# Patient Record
Sex: Female | Born: 1955
Health system: Southern US, Community
[De-identification: ages and names within clinical notes are randomized; demographics above are authoritative.]

## PROBLEM LIST (undated history)

## (undated) DIAGNOSIS — I341 Nonrheumatic mitral (valve) prolapse: Secondary | ICD-10-CM

## (undated) DIAGNOSIS — M359 Systemic involvement of connective tissue, unspecified: Secondary | ICD-10-CM

## (undated) DIAGNOSIS — N8000 Endometriosis of the uterus, unspecified: Secondary | ICD-10-CM

## (undated) DIAGNOSIS — R591 Generalized enlarged lymph nodes: Secondary | ICD-10-CM

## (undated) DIAGNOSIS — K589 Irritable bowel syndrome without diarrhea: Secondary | ICD-10-CM

## (undated) DIAGNOSIS — I10 Essential (primary) hypertension: Secondary | ICD-10-CM

## (undated) DIAGNOSIS — M75100 Unspecified rotator cuff tear or rupture of unspecified shoulder, not specified as traumatic: Secondary | ICD-10-CM

## (undated) DIAGNOSIS — H269 Unspecified cataract: Secondary | ICD-10-CM

## (undated) DIAGNOSIS — F329 Major depressive disorder, single episode, unspecified: Secondary | ICD-10-CM

## (undated) DIAGNOSIS — F32A Depression, unspecified: Secondary | ICD-10-CM

## (undated) DIAGNOSIS — M1990 Unspecified osteoarthritis, unspecified site: Secondary | ICD-10-CM

## (undated) DIAGNOSIS — D649 Anemia, unspecified: Secondary | ICD-10-CM

## (undated) DIAGNOSIS — M719 Bursopathy, unspecified: Secondary | ICD-10-CM

## (undated) DIAGNOSIS — M069 Rheumatoid arthritis, unspecified: Secondary | ICD-10-CM

## (undated) DIAGNOSIS — I509 Heart failure, unspecified: Secondary | ICD-10-CM

## (undated) DIAGNOSIS — C801 Malignant (primary) neoplasm, unspecified: Secondary | ICD-10-CM

## (undated) DIAGNOSIS — M810 Age-related osteoporosis without current pathological fracture: Secondary | ICD-10-CM

## (undated) DIAGNOSIS — R011 Cardiac murmur, unspecified: Secondary | ICD-10-CM

## (undated) DIAGNOSIS — N809 Endometriosis, unspecified: Secondary | ICD-10-CM

## (undated) DIAGNOSIS — F419 Anxiety disorder, unspecified: Secondary | ICD-10-CM

## (undated) DIAGNOSIS — N8003 Adenomyosis of the uterus: Secondary | ICD-10-CM

## (undated) DIAGNOSIS — E7800 Pure hypercholesterolemia, unspecified: Secondary | ICD-10-CM

## (undated) HISTORY — DX: Rheumatoid arthritis, unspecified: M06.9

## (undated) HISTORY — DX: Essential (primary) hypertension: I10

## (undated) HISTORY — DX: Age-related osteoporosis without current pathological fracture: M81.0

## (undated) HISTORY — DX: Irritable bowel syndrome, unspecified: K58.9

## (undated) HISTORY — DX: Pure hypercholesterolemia, unspecified: E78.00

## (undated) HISTORY — DX: Bursopathy, unspecified: M71.9

## (undated) HISTORY — PX: OTHER SURGICAL HISTORY: SHX169

## (undated) HISTORY — DX: Cardiac murmur, unspecified: R01.1

## (undated) HISTORY — DX: Nonrheumatic mitral (valve) prolapse: I34.1

## (undated) HISTORY — DX: Major depressive disorder, single episode, unspecified: F32.9

## (undated) HISTORY — PX: CARPAL TUNNEL RELEASE: SHX101

## (undated) HISTORY — DX: Anemia, unspecified: D64.9

## (undated) HISTORY — PX: FOOT SURGERY: SHX648

## (undated) HISTORY — PX: BUNIONECTOMY: SHX129

## (undated) HISTORY — PX: COLONOSCOPY: SHX174

## (undated) HISTORY — PX: TOTAL ABDOMINAL HYSTERECTOMY: SHX209

## (undated) HISTORY — PX: TONSILLECTOMY: SUR1361

## (undated) HISTORY — DX: Unspecified cataract: H26.9

## (undated) HISTORY — DX: Depression, unspecified: F32.A

## (undated) HISTORY — DX: Unspecified rotator cuff tear or rupture of unspecified shoulder, not specified as traumatic: M75.100

---

## 2000-11-06 ENCOUNTER — Other Ambulatory Visit: Admission: RE | Admit: 2000-11-06 | Discharge: 2000-11-06 | Payer: Self-pay | Admitting: Obstetrics and Gynecology

## 2001-02-18 ENCOUNTER — Emergency Department (HOSPITAL_COMMUNITY): Admission: EM | Admit: 2001-02-18 | Discharge: 2001-02-18 | Payer: Self-pay | Admitting: Emergency Medicine

## 2001-03-12 ENCOUNTER — Encounter: Admission: RE | Admit: 2001-03-12 | Discharge: 2001-03-12 | Payer: Self-pay | Admitting: Pulmonary Disease

## 2001-03-29 ENCOUNTER — Other Ambulatory Visit: Admission: RE | Admit: 2001-03-29 | Discharge: 2001-03-29 | Payer: Self-pay | Admitting: Gynecology

## 2001-06-14 ENCOUNTER — Ambulatory Visit (HOSPITAL_COMMUNITY): Admission: RE | Admit: 2001-06-14 | Discharge: 2001-06-14 | Payer: Self-pay | Admitting: Gynecology

## 2002-01-01 ENCOUNTER — Ambulatory Visit (HOSPITAL_COMMUNITY): Admission: RE | Admit: 2002-01-01 | Discharge: 2002-01-01 | Payer: Self-pay | Admitting: Gastroenterology

## 2002-01-08 ENCOUNTER — Inpatient Hospital Stay (HOSPITAL_COMMUNITY): Admission: RE | Admit: 2002-01-08 | Discharge: 2002-01-11 | Payer: Self-pay | Admitting: Gynecology

## 2003-09-04 ENCOUNTER — Other Ambulatory Visit: Admission: RE | Admit: 2003-09-04 | Discharge: 2003-09-04 | Payer: Self-pay | Admitting: Gynecology

## 2004-12-26 ENCOUNTER — Other Ambulatory Visit: Admission: RE | Admit: 2004-12-26 | Discharge: 2004-12-26 | Payer: Self-pay | Admitting: Gynecology

## 2005-01-30 ENCOUNTER — Ambulatory Visit: Payer: Self-pay | Admitting: Cardiology

## 2013-04-23 ENCOUNTER — Other Ambulatory Visit: Payer: Self-pay | Admitting: Plastic Surgery

## 2013-11-27 ENCOUNTER — Other Ambulatory Visit: Payer: Self-pay

## 2015-08-20 ENCOUNTER — Encounter (INDEPENDENT_AMBULATORY_CARE_PROVIDER_SITE_OTHER): Payer: Self-pay | Admitting: *Deleted

## 2015-10-11 ENCOUNTER — Other Ambulatory Visit (HOSPITAL_COMMUNITY): Payer: Self-pay | Admitting: Pulmonary Disease

## 2015-10-11 DIAGNOSIS — M542 Cervicalgia: Secondary | ICD-10-CM

## 2015-10-21 ENCOUNTER — Ambulatory Visit (HOSPITAL_COMMUNITY): Payer: BLUE CROSS/BLUE SHIELD

## 2015-11-23 ENCOUNTER — Other Ambulatory Visit (INDEPENDENT_AMBULATORY_CARE_PROVIDER_SITE_OTHER): Payer: Self-pay | Admitting: *Deleted

## 2015-11-23 ENCOUNTER — Ambulatory Visit (INDEPENDENT_AMBULATORY_CARE_PROVIDER_SITE_OTHER): Payer: BLUE CROSS/BLUE SHIELD | Admitting: Internal Medicine

## 2015-11-23 ENCOUNTER — Encounter (INDEPENDENT_AMBULATORY_CARE_PROVIDER_SITE_OTHER): Payer: Self-pay | Admitting: *Deleted

## 2015-11-23 ENCOUNTER — Other Ambulatory Visit (INDEPENDENT_AMBULATORY_CARE_PROVIDER_SITE_OTHER): Payer: Self-pay | Admitting: Internal Medicine

## 2015-11-23 ENCOUNTER — Encounter (INDEPENDENT_AMBULATORY_CARE_PROVIDER_SITE_OTHER): Payer: Self-pay | Admitting: Internal Medicine

## 2015-11-23 VITALS — BP 102/68 | HR 60 | Temp 98.0°F | Ht 60.0 in | Wt 100.7 lb

## 2015-11-23 DIAGNOSIS — K589 Irritable bowel syndrome without diarrhea: Secondary | ICD-10-CM | POA: Diagnosis not present

## 2015-11-23 DIAGNOSIS — K5900 Constipation, unspecified: Secondary | ICD-10-CM

## 2015-11-23 DIAGNOSIS — I341 Nonrheumatic mitral (valve) prolapse: Secondary | ICD-10-CM | POA: Insufficient documentation

## 2015-11-23 DIAGNOSIS — E7800 Pure hypercholesterolemia, unspecified: Secondary | ICD-10-CM | POA: Diagnosis not present

## 2015-11-23 DIAGNOSIS — R195 Other fecal abnormalities: Secondary | ICD-10-CM

## 2015-11-23 DIAGNOSIS — K5909 Other constipation: Secondary | ICD-10-CM

## 2015-11-23 MED ORDER — PEG 3350-KCL-NA BICARB-NACL 420 G PO SOLR: 4000.0000 mL | Freq: Once | ORAL | Status: DC

## 2015-11-23 NOTE — Telephone Encounter (Signed)
Patient needs trilyte 

## 2015-11-23 NOTE — Progress Notes (Signed)
   Subjective:    Patient ID: Vanessa Lara, female    DOB: June 26, 1956, 60 y.o.   MRN: [id number]  HPI Referred by Dr. Luan Pulling for IBS since 1973. She says there has been a change x 6 weeks. Before this she was having 2-3 stools a day. She had constipation and diarrhea.  She tells me she is constipated now. She has pain in left lower abdomen. She has bloating. She says she has no appetite due to fullness. She has not lost any weight.  She has a BM at least once a week. She has tried IBGARD which did not help. She had a colonoscopy in 2003 by Dr. Collene Mares before she had her hysterectomy . She underwent a hysterectomy for heavy vaginal bleeding and painful. Family hx of colon cancer in an 74 age 49.      Review of Systems Past Medical History  Diagnosis Date  . Depression   . High cholesterol   . Mitral valve prolapse     Past Surgical History  Procedure Laterality Date  . Total abdominal hysterectomy      2003  . Fatty tumor      2011 (left arm)  . Tonsillectomy      No Known Allergies  No current outpatient prescriptions on file prior to visit.   No current facility-administered medications on file prior to visit.     Current Outpatient Prescriptions  Medication Sig Dispense Refill  . atorvastatin (LIPITOR) 10 MG tablet Take 10 mg by mouth daily.    . meloxicam (MOBIC) 7.5 MG tablet Take 7.5 mg by mouth daily.    Marland Kitchen PARoxetine (PAXIL) 40 MG tablet Take 50 mg by mouth every morning.    . traZODone (DESYREL) 50 MG tablet Take 50 mg by mouth at bedtime.     No current facility-administered medications for this visit.         Objective:   Physical Exam    Blood pressure 102/68, pulse 60, temperature 98 F (36.7 C), height 5' (1.524 m), weight 100 lb 11.2 oz (45.677 kg). Alert and oriented. Skin warm and dry. Oral mucosa is moist.   . Sclera anicteric, conjunctivae is pink. Thyroid not enlarged. No cervical lymphadenopathy. Lungs clear. Heart regular rate and rhythm.   Abdomen is soft. Bowel sounds are positive. No hepatomegaly. No abdominal masses felt. No tenderness.  No edema to lower extremities. Stool very little, some mucous, guaiac positive.    Lot [id number] Ex 9/17     Assessment & Plan:  Constipation. Change in stool. Colonic neoplasm needs to be ruled out. Am going to try her on Amitza 18mcg BID and see how she does.  Colonoscopy. The risks and benefits such as perforation, bleeding, and infection were reviewed with the patient and is agreeable.

## 2015-11-23 NOTE — Patient Instructions (Signed)
Colonoscopy.  The risks and benefits such as perforation, bleeding, and infection were reviewed with the patient and is agreeable. 

## 2015-12-06 ENCOUNTER — Telehealth (INDEPENDENT_AMBULATORY_CARE_PROVIDER_SITE_OTHER): Payer: Self-pay | Admitting: Internal Medicine

## 2015-12-06 ENCOUNTER — Telehealth (INDEPENDENT_AMBULATORY_CARE_PROVIDER_SITE_OTHER): Payer: Self-pay | Admitting: *Deleted

## 2015-12-06 DIAGNOSIS — K5909 Other constipation: Secondary | ICD-10-CM

## 2015-12-06 MED ORDER — LUBIPROSTONE 8 MCG PO CAPS: 8.0000 ug | ORAL_CAPSULE | Freq: Two times a day (BID) | ORAL | Status: DC

## 2015-12-06 NOTE — Telephone Encounter (Signed)
Rx for Amitiza sent to her pharmacy 

## 2015-12-06 NOTE — Telephone Encounter (Signed)
Rx sent to her pharmacy 

## 2015-12-06 NOTE — Telephone Encounter (Signed)
Samples you gave her are working and she wants RX sent to [facility]

## 2015-12-10 ENCOUNTER — Encounter (HOSPITAL_COMMUNITY): Payer: Self-pay | Admitting: *Deleted

## 2015-12-13 ENCOUNTER — Encounter (HOSPITAL_COMMUNITY)
Admission: RE | Disposition: A | Discharge status: Home / Self Care | Payer: Self-pay | Source: Ambulatory Visit | Attending: Internal Medicine

## 2015-12-13 ENCOUNTER — Encounter (HOSPITAL_COMMUNITY): Payer: Self-pay | Admitting: *Deleted

## 2015-12-13 ENCOUNTER — Ambulatory Visit (HOSPITAL_COMMUNITY)
Admission: RE | Admit: 2015-12-13 | Discharge: 2015-12-13 | Disposition: A | Discharge status: Home / Self Care | Payer: BLUE CROSS/BLUE SHIELD | Source: Ambulatory Visit | Attending: Internal Medicine | Admitting: Internal Medicine

## 2015-12-13 DIAGNOSIS — R195 Other fecal abnormalities: Secondary | ICD-10-CM | POA: Diagnosis not present

## 2015-12-13 DIAGNOSIS — R194 Change in bowel habit: Secondary | ICD-10-CM | POA: Insufficient documentation

## 2015-12-13 DIAGNOSIS — E7800 Pure hypercholesterolemia, unspecified: Secondary | ICD-10-CM | POA: Insufficient documentation

## 2015-12-13 DIAGNOSIS — K644 Residual hemorrhoidal skin tags: Secondary | ICD-10-CM

## 2015-12-13 DIAGNOSIS — F329 Major depressive disorder, single episode, unspecified: Secondary | ICD-10-CM | POA: Insufficient documentation

## 2015-12-13 DIAGNOSIS — Z79899 Other long term (current) drug therapy: Secondary | ICD-10-CM | POA: Insufficient documentation

## 2015-12-13 DIAGNOSIS — Z87891 Personal history of nicotine dependence: Secondary | ICD-10-CM | POA: Insufficient documentation

## 2015-12-13 DIAGNOSIS — M1991 Primary osteoarthritis, unspecified site: Secondary | ICD-10-CM | POA: Insufficient documentation

## 2015-12-13 DIAGNOSIS — K5909 Other constipation: Secondary | ICD-10-CM

## 2015-12-13 HISTORY — PX: COLONOSCOPY: SHX5424

## 2015-12-13 HISTORY — DX: Endometriosis of uterus: N80.0

## 2015-12-13 HISTORY — DX: Adenomyosis of the uterus: N80.03

## 2015-12-13 HISTORY — DX: Endometriosis, unspecified: N80.9

## 2015-12-13 HISTORY — DX: Unspecified osteoarthritis, unspecified site: M19.90

## 2015-12-13 HISTORY — DX: Endometriosis of the uterus, unspecified: N80.00

## 2015-12-13 SURGERY — COLONOSCOPY
Anesthesia: Moderate Sedation

## 2015-12-13 MED ORDER — MEPERIDINE HCL 50 MG/ML IJ SOLN
INTRAMUSCULAR | Status: AC
  Filled 2015-12-13: qty 1

## 2015-12-13 MED ORDER — MIDAZOLAM HCL 5 MG/5ML IJ SOLN
INTRAMUSCULAR | Status: DC | PRN
  Administered 2015-12-13 (×2): 2 mg via INTRAVENOUS

## 2015-12-13 MED ORDER — MEPERIDINE HCL 50 MG/ML IJ SOLN
INTRAMUSCULAR | Status: DC | PRN
  Administered 2015-12-13 (×2): 25 mg via INTRAVENOUS

## 2015-12-13 MED ORDER — SODIUM CHLORIDE 0.9 % IV SOLN
INTRAVENOUS | Status: DC
  Administered 2015-12-13: 10:00:00 via INTRAVENOUS

## 2015-12-13 MED ORDER — STERILE WATER FOR IRRIGATION IR SOLN
Status: DC | PRN
  Administered 2015-12-13: 10:00:00

## 2015-12-13 MED ORDER — MIDAZOLAM HCL 5 MG/5ML IJ SOLN
INTRAMUSCULAR | Status: AC
  Filled 2015-12-13: qty 10

## 2015-12-13 NOTE — H&P (Signed)
Vanessa Lara is an 60 y.o. female.   Chief Complaint: Patient is here for colonoscopy. HPI: Patient is 60 year old Caucasian female was history of IBS explosive diarrhea now she has developed constipation. Change in bowel habits occurred about 6 months ago. She states she can go 2 weeks without a bowel movement. She also has noted intermittent LLQ abdominal pain. She denies rectal bleeding or melena. When she was seen in the office recently she was noted to have heme-positive stool. Last colonoscopy was normal in 2003. Tammy history significant for CRC in maternal aunt but she was in her 77s at the time of diagnosis.  Past Medical History  Diagnosis Date  . Depression   . High cholesterol   . Mitral valve prolapse   . Endometriosis   . Uterus, adenomyosis   . Arthritis     Past Surgical History  Procedure Laterality Date  . Total abdominal hysterectomy      2003  . Fatty tumor      2011 (left arm)  . Tonsillectomy    . Colonoscopy      Family History  Problem Relation Age of Onset  . Colon cancer Other    Social History:  reports that she has quit smoking. Her smoking use included Cigarettes. She has a 16.5 pack-year smoking history. She does not have any smokeless tobacco history on file. She reports that she does not drink alcohol or use illicit drugs.  Allergies: No Known Allergies  Medications Prior to Admission  Medication Sig Dispense Refill  . acetaminophen (TYLENOL) 650 MG CR tablet Take 1,300 mg by mouth daily as needed for pain.    Marland Kitchen atorvastatin (LIPITOR) 10 MG tablet Take 10 mg by mouth daily.    Marland Kitchen lubiprostone (AMITIZA) 8 MCG capsule Take 1 capsule (8 mcg total) by mouth 2 (two) times daily with a meal. 60 capsule 3  . meloxicam (MOBIC) 7.5 MG tablet Take 7.5 mg by mouth daily.    Marland Kitchen PARoxetine (PAXIL) 10 MG tablet Take 20 mg by mouth daily.    . polyethylene glycol-electrolytes (NULYTELY/GOLYTELY) 420 g solution Take 4,000 mLs by mouth once. 4000 mL 0  .  traZODone (DESYREL) 50 MG tablet Take 100 mg by mouth at bedtime.       No results found for this or any previous visit (from the past 48 hour(s)). No results found.  ROS  Blood pressure 99/60, pulse 82, temperature 97.4 F (36.3 C), temperature source Oral, resp. rate 14, height 5' (1.524 m), weight 100 lb (45.36 kg), SpO2 98 %. Physical Exam  Constitutional:  Well-developed thin Caucasian female in NAD  HENT:  Mouth/Throat: Oropharynx is clear and moist.  Eyes: Conjunctivae are normal. No scleral icterus.  Neck: No thyromegaly present.  Cardiovascular: Normal rate and regular rhythm.   No murmur heard. Respiratory: Effort normal and breath sounds normal.  GI: Soft. She exhibits no distension and no mass. There is no tenderness.  Musculoskeletal: She exhibits no edema.  Lymphadenopathy:    She has no cervical adenopathy.  Neurological: She is alert.  Skin: Skin is warm and dry.     Assessment/Plan Change in bowel habits. Heme positive stool. Diagnostic colonoscopy.  Rogene Houston, MD 12/13/2015, 10:23 AM

## 2015-12-13 NOTE — Op Note (Signed)
[facility] Patient Name: Vanessa Lara Procedure Date: 12/13/2015 10:06 AM MRN: [id number] Date of Birth: November 20, 1955 Attending MD: Hildred Laser , MD CSN: [id number] Age: 60 Admit Type: Outpatient Procedure:                Colonoscopy Indications:              Heme positive stool, Change in bowel habits Providers:                Hildred Laser, MD, Gwenlyn Fudge, RN, Randa Spike, Technician Referring MD:             Dr. Alonza Bogus, MD Medicines:                Meperidine 50 mg IV, Midazolam 4 mg IV Complications:            No immediate complications. Estimated Blood Loss:     Estimated blood loss: none. Procedure:                Pre-Anesthesia Assessment:                           - Prior to the procedure, a History and Physical                            was performed, and patient medications and                            allergies were reviewed. The patient's tolerance of                            previous anesthesia was also reviewed. The risks                            and benefits of the procedure and the sedation                            options and risks were discussed with the patient.                            All questions were answered, and informed consent                            was obtained. Prior Anticoagulants: The patient has                            taken no previous anticoagulant or antiplatelet                            agents. ASA Grade Assessment: I - A normal, healthy                            patient. After reviewing the risks and benefits,  the patient was deemed in satisfactory condition to                            undergo the procedure.                           After obtaining informed consent, the colonoscope                            was passed under direct vision. Throughout the                            procedure, the patient's blood pressure, pulse, and              oxygen saturations were monitored continuously. The                            [id number]) scope was introduced through                            the anus and advanced to the the cecum, identified                            by appendiceal orifice and ileocecal valve. The                            colonoscopy was somewhat difficult due to a                            tortuous colon. Successful completion of the                            procedure was aided by applying abdominal pressure.                            The patient tolerated the procedure well. The                            quality of the bowel preparation was excellent. The                            ileocecal valve, appendiceal orifice, and rectum                            were photographed. Scope In: 10:34:23 AM Scope Out: 10:50:50 AM Scope Withdrawal Time: 0 hours 7 minutes 17 seconds  Total Procedure Duration: 0 hours 16 minutes 27 seconds  Findings:      The colon (entire examined portion) appeared normal.      Non-bleeding external hemorrhoids were found during retroflexion.      The exam was otherwise without abnormality on direct and retroflexion       views. Impression:               - The entire examined colon is normal.                           -  Non-bleeding external hemorrhoids.                           - The examination was otherwise normal on direct                            and retroflexion views.                           - No specimens collected. Moderate Sedation:      Moderate (conscious) sedation was administered by the endoscopy nurse       and supervised by the endoscopist. The following parameters were       monitored: oxygen saturation, heart rate, blood pressure, CO2       capnography and response to care. Total physician intraservice time was       22 minutes. Recommendation:           - Patient has a contact number available for                            emergencies.  The signs and symptoms of potential                            delayed complications were discussed with the                            patient. Return to normal activities tomorrow.                            Written discharge instructions were provided to the                            patient.                           - Resume previous diet today.                           - Continue present medications.                           - Repeat colonoscopy in 10 years for screening                            purposes.                           - Return to GI clinic in 3 months. Procedure Code(s):        --- Professional ---                           [phone], Colonoscopy, flexible; diagnostic, including                            collection of specimen(s) by brushing or washing,  when performed (separate procedure)                           99152, Moderate sedation services provided by the                            same physician or other qualified health care                            professional performing the diagnostic or                            therapeutic service that the sedation supports,                            requiring the presence of an independent trained                            observer to assist in the monitoring of the                            patient's level of consciousness and physiological                            status; initial 15 minutes of intraservice time,                            patient age 85 years or older Diagnosis Code(s):        --- Professional ---                           K64.4, Residual hemorrhoidal skin tags                           R19.5, Other fecal abnormalities                           R19.4, Change in bowel habit CPT copyright 2016 American Medical Association. All rights reserved. The codes documented in this report are preliminary and upon coder review may  be revised to meet current compliance  requirements. Hildred Laser, MD Hildred Laser, MD 12/13/2015 11:11:31 AM This report has been signed electronically. Number of Addenda: 0

## 2015-12-13 NOTE — Discharge Instructions (Signed)
Resume usual medications and diet. Increase fiber intake as tolerated. No driving for 24 hours. Office visit in 3 months.  Colonoscopy, Care After These instructions give you information on caring for yourself after your procedure. Your doctor may also give you more specific instructions. Call your doctor if you have any problems or questions after your procedure. HOME CARE  Do not drive for 24 hours.  Do not sign important papers or use machinery for 24 hours.  You may shower.  You may go back to your usual activities, but go slower for the first 24 hours.  Take rest breaks often during the first 24 hours.  Walk around or use warm packs on your belly (abdomen) if you have belly cramping or gas.  Drink enough fluids to keep your pee (urine) clear or pale yellow.  Resume your normal diet. Avoid heavy or fried foods.  Avoid drinking alcohol for 24 hours or as told by your doctor.  Only take medicines as told by your doctor. If a tissue sample (biopsy) was taken during the procedure:   Do not take aspirin or blood thinners for 7 days, or as told by your doctor.  Do not drink alcohol for 7 days, or as told by your doctor.  Eat soft foods for the first 24 hours. GET HELP IF: You still have a small amount of blood in your poop (stool) 2-3 days after the procedure. GET HELP RIGHT AWAY IF:  You have more than a small amount of blood in your poop.  You see clumps of tissue (blood clots) in your poop.  Your belly is puffy (swollen).  You feel sick to your stomach (nauseous) or throw up (vomit).  You have a fever.  You have belly pain that gets worse and medicine does not help. MAKE SURE YOU:  Understand these instructions.  Will watch your condition.  Will get help right away if you are not doing well or get worse.   This information is not intended to replace advice given to you by your health care provider. Make sure you discuss any questions you have with your  health care provider.   Document Released: 10/21/2010 Document Revised: 09/23/2013 Document Reviewed: 05/26/2013 Elsevier Interactive Patient Education 2016 [employer].   Hemorrhoids Hemorrhoids are swollen veins around the rectum or anus. There are two types of hemorrhoids:   Internal hemorrhoids. These occur in the veins just inside the rectum. They may poke through to the outside and become irritated and painful.  External hemorrhoids. These occur in the veins outside the anus and can be felt as a painful swelling or hard lump near the anus. CAUSES  Pregnancy.   Obesity.   Constipation or diarrhea.   Straining to have a bowel movement.   Sitting for long periods on the toilet.  Heavy lifting or other activity that caused you to strain.  Anal intercourse. SYMPTOMS   Pain.   Anal itching or irritation.   Rectal bleeding.   Fecal leakage.   Anal swelling.   One or more lumps around the anus.  DIAGNOSIS  Your caregiver may be able to diagnose hemorrhoids by visual examination. Other examinations or tests that may be performed include:   Examination of the rectal area with a gloved hand (digital rectal exam).   Examination of anal canal using a small tube (scope).   A blood test if you have lost a significant amount of blood.  A test to look inside the colon (sigmoidoscopy or colonoscopy).  TREATMENT Most hemorrhoids can be treated at home. However, if symptoms do not seem to be getting better or if you have a lot of rectal bleeding, your caregiver may perform a procedure to help make the hemorrhoids get smaller or remove them completely. Possible treatments include:   Placing a rubber band at the base of the hemorrhoid to cut off the circulation (rubber band ligation).   Injecting a chemical to shrink the hemorrhoid (sclerotherapy).   Using a tool to burn the hemorrhoid (infrared light therapy).   Surgically removing the hemorrhoid  (hemorrhoidectomy).   Stapling the hemorrhoid to block blood flow to the tissue (hemorrhoid stapling).  HOME CARE INSTRUCTIONS   Eat foods with fiber, such as whole grains, beans, nuts, fruits, and vegetables. Ask your doctor about taking products with added fiber in them (fibersupplements).  Increase fluid intake. Drink enough water and fluids to keep your urine clear or pale yellow.   Exercise regularly.   Go to the bathroom when you have the urge to have a bowel movement. Do not wait.   Avoid straining to have bowel movements.   Keep the anal area dry and clean. Use wet toilet paper or moist towelettes after a bowel movement.   Medicated creams and suppositories may be used or applied as directed.   Only take over-the-counter or prescription medicines as directed by your caregiver.   Take warm sitz baths for 15-20 minutes, 3-4 times a day to ease pain and discomfort.   Place ice packs on the hemorrhoids if they are tender and swollen. Using ice packs between sitz baths may be helpful.   Put ice in a plastic bag.   Place a towel between your skin and the bag.   Leave the ice on for 15-20 minutes, 3-4 times a day.   Do not use a donut-shaped pillow or sit on the toilet for long periods. This increases blood pooling and pain.  SEEK MEDICAL CARE IF:  You have increasing pain and swelling that is not controlled by treatment or medicine.  You have uncontrolled bleeding.  You have difficulty or you are unable to have a bowel movement.  You have pain or inflammation outside the area of the hemorrhoids. MAKE SURE YOU:  Understand these instructions.  Will watch your condition.  Will get help right away if you are not doing well or get worse.   This information is not intended to replace advice given to you by your health care provider. Make sure you discuss any questions you have with your health care provider.   Document Released: 09/15/2000 Document  Revised: 09/04/2012 Document Reviewed: 07/23/2012 Elsevier Interactive Patient Education 2016 Elsevier Inc.  High-Fiber Diet Fiber, also called dietary fiber, is a type of carbohydrate found in fruits, vegetables, whole grains, and beans. A high-fiber diet can have many health benefits. Your health care provider may recommend a high-fiber diet to help:  Prevent constipation. Fiber can make your bowel movements more regular.  Lower your cholesterol.  Relieve hemorrhoids, uncomplicated diverticulosis, or irritable bowel syndrome.  Prevent overeating as part of a weight-loss plan.  Prevent heart disease, type 2 diabetes, and certain cancers. WHAT IS MY PLAN? The recommended daily intake of fiber includes:  38 grams for men under age 94.  96 grams for men over age 62.  59 grams for women under age 6.  53 grams for women over age 35. You can get the recommended daily intake of dietary fiber by eating a variety of fruits,  vegetables, grains, and beans. Your health care provider may also recommend a fiber supplement if it is not possible to get enough fiber through your diet. WHAT DO I NEED TO KNOW ABOUT A HIGH-FIBER DIET?  Fiber supplements have not been widely studied for their effectiveness, so it is better to get fiber through food sources.  Always check the fiber content on thenutrition facts label of any prepackaged food. Look for foods that contain at least 5 grams of fiber per serving.  Ask your dietitian if you have questions about specific foods that are related to your condition, especially if those foods are not listed in the following section.  Increase your daily fiber consumption gradually. Increasing your intake of dietary fiber too quickly may cause bloating, cramping, or gas.  Drink plenty of water. Water helps you to digest fiber. WHAT FOODS CAN I EAT? Grains Whole-grain breads. Multigrain cereal. Oats and oatmeal. Brown rice. Barley. Bulgur wheat. [place]. Bran  muffins. Popcorn. Rye wafer crackers. Vegetables Sweet potatoes. Spinach. Kale. Artichokes. Cabbage. Broccoli. Green peas. Carrots. Squash. Fruits Berries. Pears. Apples. Oranges. Avocados. Prunes and raisins. Dried figs. Meats and Other Protein Sources [place], kidney, pinto, and soy beans. Split peas. Lentils. Nuts and seeds. Dairy Fiber-fortified yogurt. Beverages Fiber-fortified soy milk. Fiber-fortified orange juice. Other Fiber bars. The items listed above may not be a complete list of recommended foods or beverages. Contact your dietitian for more options. WHAT FOODS ARE NOT RECOMMENDED? Grains White bread. Pasta made with refined flour. White rice. Vegetables Fried potatoes. Canned vegetables. Well-cooked vegetables.  Fruits Fruit juice. Cooked, strained fruit. Meats and Other Protein Sources Fatty cuts of meat. Fried Sales executive or fried fish. Dairy Milk. Yogurt. Cream cheese. Sour cream. Beverages Soft drinks. Other Cakes and pastries. Butter and oils. The items listed above may not be a complete list of foods and beverages to avoid. Contact your dietitian for more information. WHAT ARE SOME TIPS FOR INCLUDING HIGH-FIBER FOODS IN MY DIET?  Eat a wide variety of high-fiber foods.  Make sure that half of all grains consumed each day are whole grains.  Replace breads and cereals made from refined flour or white flour with whole-grain breads and cereals.  Replace white rice with brown rice, bulgur wheat, or millet.  Start the day with a breakfast that is high in fiber, such as a cereal that contains at least 5 grams of fiber per serving.  Use beans in place of meat in soups, salads, or pasta.  Eat high-fiber snacks, such as berries, raw vegetables, nuts, or popcorn.   This information is not intended to replace advice given to you by your health care provider. Make sure you discuss any questions you have with your health care provider.   Document Released: 09/18/2005  Document Revised: 10/09/2014 Document Reviewed: 03/03/2014 Elsevier Interactive Patient Education [employer].

## 2015-12-14 ENCOUNTER — Telehealth (INDEPENDENT_AMBULATORY_CARE_PROVIDER_SITE_OTHER): Payer: Self-pay | Admitting: Internal Medicine

## 2015-12-14 DIAGNOSIS — K5909 Other constipation: Secondary | ICD-10-CM

## 2015-12-14 MED ORDER — LINACLOTIDE 145 MCG PO CAPS: 145.0000 ug | ORAL_CAPSULE | Freq: Every day | ORAL | Status: DC

## 2015-12-14 NOTE — Telephone Encounter (Signed)
Rx for Linzess sent to her pharmacy.  

## 2015-12-20 ENCOUNTER — Encounter (HOSPITAL_COMMUNITY): Payer: Self-pay | Admitting: Internal Medicine

## 2015-12-31 ENCOUNTER — Other Ambulatory Visit (HOSPITAL_COMMUNITY): Payer: Self-pay | Admitting: Pulmonary Disease

## 2015-12-31 DIAGNOSIS — R1032 Left lower quadrant pain: Secondary | ICD-10-CM

## 2016-01-06 ENCOUNTER — Ambulatory Visit (HOSPITAL_COMMUNITY)
Admission: RE | Admit: 2016-01-06 | Discharge: 2016-01-06 | Disposition: A | Discharge status: Home / Self Care | Payer: BLUE CROSS/BLUE SHIELD | Source: Ambulatory Visit | Attending: Pulmonary Disease | Admitting: Pulmonary Disease

## 2016-01-06 DIAGNOSIS — I700 Atherosclerosis of aorta: Secondary | ICD-10-CM | POA: Insufficient documentation

## 2016-01-06 DIAGNOSIS — R1032 Left lower quadrant pain: Secondary | ICD-10-CM | POA: Diagnosis not present

## 2016-01-06 MED ORDER — IOPAMIDOL (ISOVUE-300) INJECTION 61%
100.0000 mL | Freq: Once | INTRAVENOUS | Status: AC | PRN
  Administered 2016-01-06: 100 mL via INTRAVENOUS

## 2016-01-06 MED ORDER — BARIUM SULFATE 2 % PO SUSP: 450.0000 mL | Freq: Once | ORAL | Status: DC

## 2016-01-11 ENCOUNTER — Telehealth (INDEPENDENT_AMBULATORY_CARE_PROVIDER_SITE_OTHER): Payer: Self-pay | Admitting: *Deleted

## 2016-01-11 NOTE — Telephone Encounter (Signed)
Patient insurance denied Amitiza.  They recommended the Linzess as one of the alternatives. The other 2 are Lactulose Solution , Polyethylene Glycol powder or packets.  Per Karna Christmas - Linzess 145 mcg - take 1 by mouth daily #30 5 refills. This was called to [facility]/[place].  Patient was called and made aware. She states that Dr.Hawkins had ordered a CT of abdomen and she rec'd a letter it was okay and she should follow up with her Gastroenterologist. Patient has an appointment with Terri on 03-14-2016. She is asking for a sooner appointment and with Dr.Rehman. It was explained is schedule. She would appreciate a call back.

## 2016-02-01 ENCOUNTER — Ambulatory Visit (INDEPENDENT_AMBULATORY_CARE_PROVIDER_SITE_OTHER): Payer: BLUE CROSS/BLUE SHIELD | Admitting: Internal Medicine

## 2016-02-01 ENCOUNTER — Encounter (INDEPENDENT_AMBULATORY_CARE_PROVIDER_SITE_OTHER): Payer: Self-pay | Admitting: Internal Medicine

## 2016-02-01 VITALS — BP 100/70 | HR 64 | Temp 97.8°F | Ht 60.0 in | Wt 98.2 lb

## 2016-02-01 DIAGNOSIS — R197 Diarrhea, unspecified: Secondary | ICD-10-CM

## 2016-02-01 DIAGNOSIS — K6289 Other specified diseases of anus and rectum: Secondary | ICD-10-CM | POA: Diagnosis not present

## 2016-02-01 DIAGNOSIS — K5909 Other constipation: Secondary | ICD-10-CM

## 2016-02-01 MED ORDER — DIBUCAINE 1 % EX OINT
TOPICAL_OINTMENT | Freq: Three times a day (TID) | CUTANEOUS | Status: DC | PRN
Start: 2016-02-01 — End: 2017-04-30

## 2016-02-01 MED ORDER — LINACLOTIDE 72 MCG PO CAPS: 72.0000 ug | ORAL_CAPSULE | Freq: Every day | ORAL | Status: DC

## 2016-02-01 NOTE — Progress Notes (Signed)
   Subjective:    Patient ID: Vanessa Lara, female    DOB: 11/25/1955, 60 y.o.   MRN: [id number]  HPI Here today for f/u. She tells me today she has had diarrhea since Sunday. Her last dose of Linzess was Friday.  She has had 3 stools so far today and all  were loose. She has not tried to take anything for the diarrhea. She is afraid she will become constipated.  She has a hx of IBS. She denies LLQ pain at this time. She says she does feel better.  Her appetite is good. She has lost 2 pounds since her visit in February.     01/06/2016 CT abdomen/pelvis with CM: LLQ pain x 6 months:    IMPRESSION: 1. No acute abdominal/pelvic findings, mass lesions or adenopathy. 2. Moderate atherosclerotic calcifications involving the distal aorta and iliac arteries.                          -   12/13/2015 Colonoscopy:   Indications:Heme positive stool, Change in bowel habits   Findings: The colon (entire examined portion) appeared normal.  Non-bleeding external hemorrhoids were found during retroflexion.  The exam was otherwise without abnormality on direct and retroflexion   views.   Review of Systems Past Medical History  Diagnosis Date  . Depression   . High cholesterol   . Mitral valve prolapse   . Endometriosis   . Uterus, adenomyosis   . Arthritis     Past Surgical History  Procedure Laterality Date  . Total abdominal hysterectomy      2003  . Fatty tumor      20 11 (left arm)  . Tonsillectomy    . Colonoscopy    . Colonoscopy N/A 12/13/2015    Procedure: COLONOSCOPY;  Surgeon: Rogene Houston, MD;  Location: [facility];  Service: Endoscopy;  Laterality: N/A;  9:55    No Known Allergies  Current Outpatient Prescriptions on File Prior to Visit  Medication Sig Dispense Refill  . acetaminophen (TYLENOL) 650 MG CR tablet Take 1,300 mg by mouth daily as needed for pain.    Marland Kitchen atorvastatin (LIPITOR) 10 MG tablet Take 10 mg by mouth daily.    . Linaclotide  (LINZESS) 145 MCG CAPS capsule Take 1 capsule (145 mcg total) by mouth daily. (Patient taking differently: Take 145 mcg by mouth as needed. ) 30 capsule 3  . meloxicam (MOBIC) 7.5 MG tablet Take 7.5 mg by mouth daily.    Marland Kitchen PARoxetine (PAXIL) 10 MG tablet Take 20 mg by mouth daily.    . traZODone (DESYREL) 50 MG tablet Take 100 mg by mouth at bedtime.      No current facility-administered medications on file prior to visit.        Objective:   Physical Exam Blood pressure 100/70, pulse 64, temperature 97.8 F (36.6 C), height 5' (1.524 m), weight 98 lb 3.2 oz (44.543 kg).  Alert and oriented. Skin warm and dry. Oral mucosa is moist.   . Sclera anicteric, conjunctivae is pink. Thyroid not enlarged. No cervical lymphadenopathy. Lungs clear. Heart regular rate and rhythm.  Abdomen is soft. Bowel sounds are positive. No hepatomegaly. No abdominal masses felt. No tenderness.  No edema to lower extremities.    .      Assessment & Plan:  IBS diarrhea. Imodium as needed. IBS constipaton: Continue the LInzess 26mcg daily or every other day.  OV in 6 months.

## 2016-02-01 NOTE — Patient Instructions (Signed)
Imodium as needed. Linzess 21mcg as needed. OV in 6 months

## 2016-03-07 DIAGNOSIS — H10413 Chronic giant papillary conjunctivitis, bilateral: Secondary | ICD-10-CM | POA: Diagnosis not present

## 2016-03-07 DIAGNOSIS — H25811 Combined forms of age-related cataract, right eye: Secondary | ICD-10-CM | POA: Diagnosis not present

## 2016-03-07 DIAGNOSIS — H40013 Open angle with borderline findings, low risk, bilateral: Secondary | ICD-10-CM | POA: Diagnosis not present

## 2016-03-07 DIAGNOSIS — H25812 Combined forms of age-related cataract, left eye: Secondary | ICD-10-CM | POA: Diagnosis not present

## 2016-03-14 ENCOUNTER — Ambulatory Visit (INDEPENDENT_AMBULATORY_CARE_PROVIDER_SITE_OTHER): Payer: BLUE CROSS/BLUE SHIELD | Admitting: Internal Medicine

## 2016-03-14 ENCOUNTER — Encounter (INDEPENDENT_AMBULATORY_CARE_PROVIDER_SITE_OTHER): Payer: Self-pay | Admitting: Internal Medicine

## 2016-03-14 VITALS — BP 94/40 | HR 72 | Temp 97.5°F | Ht 60.0 in | Wt 97.5 lb

## 2016-03-14 DIAGNOSIS — K5909 Other constipation: Secondary | ICD-10-CM

## 2016-03-14 DIAGNOSIS — K5900 Constipation, unspecified: Secondary | ICD-10-CM | POA: Insufficient documentation

## 2016-03-14 MED ORDER — LUBIPROSTONE 8 MCG PO CAPS: 8.0000 ug | ORAL_CAPSULE | Freq: Every day | ORAL | Status: DC

## 2016-03-14 NOTE — Progress Notes (Signed)
   Subjective:    Patient ID: Vanessa Lara, female    DOB: April 05, 1956, 60 y.o.   MRN: [id number]  HPI Presents today for f/u of her constipation. She tells me she could not take the [place]. It caused her abdomen to swell. She was having a BM once a week.  Has been off for about 2 weeks. She says she started the Amitiza 22mcg this past Saturday and has had a BM daily since. Her stomach is back to normal. There is no swelling.  Her appetite is good. No weight loss. She continue to work full time.     Review of Systems Past Medical History  Diagnosis Date  . Depression   . High cholesterol   . Mitral valve prolapse   . Endometriosis   . Uterus, adenomyosis   . Arthritis     Past Surgical History  Procedure Laterality Date  . Total abdominal hysterectomy      2003  . Fatty tumor      2011 (left arm)  . Tonsillectomy    . Colonoscopy    . Colonoscopy N/A 12/13/2015    Procedure: COLONOSCOPY;  Surgeon: Rogene Houston, MD;  [facility]: [facility];  Service: Endoscopy;  Laterality: N/A;  9:55    No Known Allergies  Current Outpatient Prescriptions on File Prior to Visit  Medication Sig Dispense Refill  . acetaminophen (TYLENOL) 650 MG CR tablet Take 1,300 mg by mouth daily as needed for pain.    Marland Kitchen atorvastatin (LIPITOR) 10 MG tablet Take 10 mg by mouth daily.    . dibucaine (NUPERCAINAL) 1 % ointment Apply topically 3 (three) times daily as needed for pain. 30 g 2  . meloxicam (MOBIC) 7.5 MG tablet Take 7.5 mg by mouth daily.    Marland Kitchen PARoxetine (PAXIL) 10 MG tablet Take 20 mg by mouth daily.    . traZODone (DESYREL) 50 MG tablet Take 100 mg by mouth at bedtime.     Marland Kitchen linaclotide (LINZESS) 72 MCG capsule Take 1 capsule (72 mcg total) by mouth daily before breakfast. (Patient not taking: Reported on 03/14/2016) 30 capsule 5   No current facility-administered medications on file prior to visit.        Objective:   Physical Exam Blood pressure 94/40, pulse 72, temperature 97.5  F (36.4 C), height 5' (1.524 m), weight 97 lb 8 oz (44.226 kg). Alert and oriented. Skin warm and dry. Oral mucosa is moist.   . Sclera anicteric, conjunctivae is pink. Thyroid not enlarged. No cervical lymphadenopathy. Lungs clear. Heart regular rate and rhythm.  Abdomen is soft. Bowel sounds are positive. No hepatomegaly. No abdominal masses felt. No tenderness.  No edema to lower extremities.          Assessment & Plan:  Constipation. Her constipation is much better since starting the Amitiza. Samples of Amitiza given to patient Continue the Amitiza . OV in 1 year.

## 2016-03-14 NOTE — Patient Instructions (Signed)
Continue Amitiza. OV in 1 year.

## 2016-03-24 DIAGNOSIS — H2512 Age-related nuclear cataract, left eye: Secondary | ICD-10-CM | POA: Diagnosis not present

## 2016-03-24 DIAGNOSIS — H25812 Combined forms of age-related cataract, left eye: Secondary | ICD-10-CM | POA: Diagnosis not present

## 2016-03-28 DIAGNOSIS — M9901 Segmental and somatic dysfunction of cervical region: Secondary | ICD-10-CM | POA: Diagnosis not present

## 2016-03-28 DIAGNOSIS — M542 Cervicalgia: Secondary | ICD-10-CM | POA: Diagnosis not present

## 2016-03-28 DIAGNOSIS — G5601 Carpal tunnel syndrome, right upper limb: Secondary | ICD-10-CM | POA: Diagnosis not present

## 2016-03-28 DIAGNOSIS — M9907 Segmental and somatic dysfunction of upper extremity: Secondary | ICD-10-CM | POA: Diagnosis not present

## 2016-03-30 DIAGNOSIS — M25531 Pain in right wrist: Secondary | ICD-10-CM | POA: Diagnosis not present

## 2016-03-31 ENCOUNTER — Other Ambulatory Visit (HOSPITAL_COMMUNITY): Payer: Self-pay | Admitting: Physician Assistant

## 2016-03-31 DIAGNOSIS — M9901 Segmental and somatic dysfunction of cervical region: Secondary | ICD-10-CM | POA: Diagnosis not present

## 2016-03-31 DIAGNOSIS — M25531 Pain in right wrist: Secondary | ICD-10-CM

## 2016-03-31 DIAGNOSIS — G5601 Carpal tunnel syndrome, right upper limb: Secondary | ICD-10-CM | POA: Diagnosis not present

## 2016-03-31 DIAGNOSIS — M9907 Segmental and somatic dysfunction of upper extremity: Secondary | ICD-10-CM | POA: Diagnosis not present

## 2016-03-31 DIAGNOSIS — M542 Cervicalgia: Secondary | ICD-10-CM | POA: Diagnosis not present

## 2016-04-06 DIAGNOSIS — H2511 Age-related nuclear cataract, right eye: Secondary | ICD-10-CM | POA: Diagnosis not present

## 2016-04-10 ENCOUNTER — Ambulatory Visit (HOSPITAL_COMMUNITY): Admission: RE | Admit: 2016-04-10 | Payer: BLUE CROSS/BLUE SHIELD | Source: Ambulatory Visit

## 2016-04-12 DIAGNOSIS — H25811 Combined forms of age-related cataract, right eye: Secondary | ICD-10-CM | POA: Diagnosis not present

## 2016-04-12 DIAGNOSIS — H2511 Age-related nuclear cataract, right eye: Secondary | ICD-10-CM | POA: Diagnosis not present

## 2016-04-13 ENCOUNTER — Ambulatory Visit (HOSPITAL_COMMUNITY)
Admission: RE | Admit: 2016-04-13 | Discharge: 2016-04-13 | Disposition: A | Discharge status: Home / Self Care | Payer: BLUE CROSS/BLUE SHIELD | Source: Ambulatory Visit | Attending: Physician Assistant | Admitting: Physician Assistant

## 2016-04-13 DIAGNOSIS — R600 Localized edema: Secondary | ICD-10-CM | POA: Diagnosis not present

## 2016-04-13 DIAGNOSIS — M25531 Pain in right wrist: Secondary | ICD-10-CM | POA: Diagnosis not present

## 2016-04-13 DIAGNOSIS — M25431 Effusion, right wrist: Secondary | ICD-10-CM | POA: Insufficient documentation

## 2016-04-20 ENCOUNTER — Ambulatory Visit (INDEPENDENT_AMBULATORY_CARE_PROVIDER_SITE_OTHER): Payer: BLUE CROSS/BLUE SHIELD | Admitting: Internal Medicine

## 2016-05-08 ENCOUNTER — Encounter (INDEPENDENT_AMBULATORY_CARE_PROVIDER_SITE_OTHER): Payer: Self-pay | Admitting: Internal Medicine

## 2016-05-08 ENCOUNTER — Ambulatory Visit (INDEPENDENT_AMBULATORY_CARE_PROVIDER_SITE_OTHER): Payer: BLUE CROSS/BLUE SHIELD | Admitting: Internal Medicine

## 2016-05-08 VITALS — BP 100/60 | HR 88 | Temp 98.0°F | Ht 60.0 in | Wt 100.1 lb

## 2016-05-08 DIAGNOSIS — R197 Diarrhea, unspecified: Secondary | ICD-10-CM | POA: Diagnosis not present

## 2016-05-08 LAB — AMYLASE: Amylase: 43 U/L (ref 0–105)

## 2016-05-08 LAB — LIPASE: Lipase: 43 U/L (ref 7–60)

## 2016-05-08 NOTE — Patient Instructions (Signed)
Labs today    Further recommendations to follow

## 2016-05-08 NOTE — Progress Notes (Signed)
Subjective:    Patient ID: Vanessa Lara, female    DOB: December 22, 1955, 60 y.o.   MRN: [id number]  HPI Here today for f/u. She was last seen in June for constipation. She states today she has diarrhea. She will have one BM which is formed and then the next BM will be explosive. She says she had diarrhea before she started the Amitiza. She is having 3 stools a week. Her stools are brown and solid then followed by explosive diarrhea on the same day.  Her appetite is good. She has gained 3 pounds since her last visit in June.    12/13/2015 Colonoscopy. Dr. Laural Golden: Indications: Heme positive stool, Change in bowel habits  Impression:               - The entire examined colon is normal.                           - Non-bleeding external hemorrhoids.                           - The examination was otherwise normal on direct                            and retroflexion views.                           - No specimens collected.   01/06/2016 CT abdomen/pelvis with CM: LLQ pain:   Pancreas: No mass, inflammation or ductal dilatation.          Review of Systems     Past Medical History:  Diagnosis Date  . Arthritis   . Depression   . Endometriosis   . High cholesterol   . Mitral valve prolapse   . Uterus, adenomyosis     Past Surgical History:  Procedure Laterality Date  . COLONOSCOPY    . COLONOSCOPY N/A 12/13/2015   Procedure: COLONOSCOPY;  Surgeon: Rogene Houston, MD;  [facility]: [facility];  Service: Endoscopy;  Laterality: N/A;  9:55  . Fatty tumor     2011 (left arm)  . TONSILLECTOMY    . TOTAL ABDOMINAL HYSTERECTOMY     2003    No Known Allergies  Current Outpatient Prescriptions on File Prior to Visit  Medication Sig Dispense Refill  . acetaminophen (TYLENOL) 650 MG CR tablet Take 1,300 mg by mouth daily as needed for pain.    Marland Kitchen atorvastatin (LIPITOR) 10 MG tablet Take 10 mg by mouth daily.    . dibucaine (NUPERCAINAL) 1 % ointment Apply topically 3 (three)  times daily as needed for pain. 30 g 2  . lubiprostone (AMITIZA) 8 MCG capsule Take 1 capsule (8 mcg total) by mouth daily with breakfast. 90 capsule 5  . meloxicam (MOBIC) 7.5 MG tablet Take 7.5 mg by mouth daily.    Marland Kitchen PARoxetine (PAXIL) 10 MG tablet Take 20 mg by mouth daily.    . traZODone (DESYREL) 50 MG tablet Take 100 mg by mouth at bedtime.      No current facility-administered medications on file prior to visit.     Objective:   Physical Exam Blood pressure 100/60, pulse 88, temperature 98 F (36.7 C), height 5' (1.524 m), weight 100 lb 1.6 oz (45.4 kg).  Alert and oriented. Skin warm and dry. Oral mucosa  is moist.   . Sclera anicteric, conjunctivae is pink. Thyroid not enlarged. No cervical lymphadenopathy. Lungs clear. Heart regular rate and rhythm.  Abdomen is soft. Bowel sounds are positive. No hepatomegaly. No abdominal masses felt. No tenderness.  No edema to lower extremities.        Assessment & Plan:  Constipation/diarrhea. Will get a Amylase and Lipase. Further recommendations to follow.

## 2016-07-07 DIAGNOSIS — G5601 Carpal tunnel syndrome, right upper limb: Secondary | ICD-10-CM | POA: Diagnosis not present

## 2016-07-07 DIAGNOSIS — M546 Pain in thoracic spine: Secondary | ICD-10-CM | POA: Diagnosis not present

## 2016-07-07 DIAGNOSIS — M9907 Segmental and somatic dysfunction of upper extremity: Secondary | ICD-10-CM | POA: Diagnosis not present

## 2016-07-07 DIAGNOSIS — M9902 Segmental and somatic dysfunction of thoracic region: Secondary | ICD-10-CM | POA: Diagnosis not present

## 2016-07-17 ENCOUNTER — Ambulatory Visit (INDEPENDENT_AMBULATORY_CARE_PROVIDER_SITE_OTHER): Payer: BLUE CROSS/BLUE SHIELD | Admitting: Physician Assistant

## 2016-07-17 DIAGNOSIS — M25531 Pain in right wrist: Secondary | ICD-10-CM | POA: Diagnosis not present

## 2016-08-03 ENCOUNTER — Ambulatory Visit (INDEPENDENT_AMBULATORY_CARE_PROVIDER_SITE_OTHER): Payer: BLUE CROSS/BLUE SHIELD | Admitting: Internal Medicine

## 2016-08-03 ENCOUNTER — Telehealth (INDEPENDENT_AMBULATORY_CARE_PROVIDER_SITE_OTHER): Payer: Self-pay | Admitting: Orthopaedic Surgery

## 2016-08-03 ENCOUNTER — Encounter (INDEPENDENT_AMBULATORY_CARE_PROVIDER_SITE_OTHER): Payer: Self-pay | Admitting: Orthopedic Surgery

## 2016-08-03 ENCOUNTER — Ambulatory Visit (INDEPENDENT_AMBULATORY_CARE_PROVIDER_SITE_OTHER): Payer: Self-pay

## 2016-08-03 ENCOUNTER — Ambulatory Visit (INDEPENDENT_AMBULATORY_CARE_PROVIDER_SITE_OTHER): Payer: BLUE CROSS/BLUE SHIELD | Admitting: Orthopedic Surgery

## 2016-08-03 VITALS — Ht 60.0 in | Wt 97.0 lb

## 2016-08-03 DIAGNOSIS — M6701 Short Achilles tendon (acquired), right ankle: Secondary | ICD-10-CM

## 2016-08-03 DIAGNOSIS — M21611 Bunion of right foot: Secondary | ICD-10-CM

## 2016-08-03 DIAGNOSIS — M79671 Pain in right foot: Secondary | ICD-10-CM

## 2016-08-03 DIAGNOSIS — M205X1 Other deformities of toe(s) (acquired), right foot: Secondary | ICD-10-CM

## 2016-08-03 NOTE — Progress Notes (Signed)
Office Visit Note   Patient: Vanessa Lara           Date of Birth: Oct 22, 1955           MRN: [id number] Visit Date: 08/03/2016              Requested by: Sinda Du, MD [place], [place] [address] PCP: Alonza Bogus, MD   Assessment & Plan: Visit Diagnoses:  1. Bunion of great toe of right foot   2. Pain in right foot   3. Claw toe, acquired, right   4. Short Achilles tendon (acquired), right ankle     Plan: Plan for bunion and claw toe surgery right foot. Patient states she has pain with activities of daily living she states she has failed years of conservative care including shoewear modification. Plan for chevron osteotomy the first metatarsal possible Akin osteotomy of the proximal phalanx of the great toe with a Weil osteotomy for the second and third metatarsals. Risk and benefits were discussed including infection neurovascular injury persistent pain and need for additional surgery. Patient states she understands wish to proceed at this time.  Follow-Up Instructions: No Follow-up on file.   Orders:  Orders Placed This Encounter  Procedures  . XR Foot 2 Views Right   No orders of the defined types were placed in this encounter.     Procedures: No procedures performed   Clinical Data: No additional findings.   Subjective: Chief Complaint  Patient presents with  . Right Foot - Pain    Pt c/o right foot bunion and causing significant pain especially with closed shoe wear. "its affecting my other toes" and would like to discuss surgical correction.    Review of Systems   Objective: Vital Signs: Ht 5' (1.524 m)   Wt 97 lb (44 kg)   BMI 18.94 kg/m   Physical Exam patient is alert oriented no adenopathy she is well dressed and will affect the wrist where she has a normal gait. She has a good dorsalis pedis pulse she has heel cord tightness with dorsiflexion only to neutral. She has a prominent second and third  metatarsal head which are painful to palpation she has flexible clawing of the lesser toes. She has a bunion deformity with overlapping the great toe over the second toe.   Ortho Exam  Specialty Comments:  No specialty comments available.  Imaging: Xr Foot 2 Views Right  Result Date: 08/03/2016 2 view radiographs of the right foot shows a moderate bunion deformity right great toe with a congruent MTP joint. She has a long second and third metatarsal with clawing of the second and third toes.    PMFS History: Patient Active Problem List   Diagnosis Date Noted  . Constipation 03/14/2016  . Mitral valve prolapse 11/23/2015  . High cholesterol 11/23/2015   Past Medical History:  Diagnosis Date  . Arthritis   . Depression   . Endometriosis   . High cholesterol   . Mitral valve prolapse   . Uterus, adenomyosis     Family History  Problem Relation Age of Onset  . Colon cancer Other     Past Surgical History:  Procedure Laterality Date  . COLONOSCOPY    . COLONOSCOPY N/A 12/13/2015   Procedure: COLONOSCOPY;  Surgeon: Rogene Houston, MD;  [facility]: [facility];  Service: Endoscopy;  Laterality: N/A;  9:55  . Fatty tumor     2011 (left arm)  . TONSILLECTOMY    .  TOTAL ABDOMINAL HYSTERECTOMY     2003   Social History   Occupational History  . Not on file.   Social History Main Topics  . Smoking status: Former Smoker    Packs/day: 0.50    Years: 33.00    Types: Cigarettes  . Smokeless tobacco: Not on file     Comment: quit 3 yrs ago  . Alcohol use No  . Drug use: No  . Sexual activity: Not on file

## 2016-08-03 NOTE — Telephone Encounter (Signed)
Patient wants you to know her index finger and her thumb are still bothering her; she wants to know is it her arthritis or tendonitis. Please call to discuss.

## 2016-11-23 DIAGNOSIS — M546 Pain in thoracic spine: Secondary | ICD-10-CM | POA: Diagnosis not present

## 2016-11-23 DIAGNOSIS — M542 Cervicalgia: Secondary | ICD-10-CM | POA: Diagnosis not present

## 2016-11-23 DIAGNOSIS — M9902 Segmental and somatic dysfunction of thoracic region: Secondary | ICD-10-CM | POA: Diagnosis not present

## 2016-11-23 DIAGNOSIS — M9901 Segmental and somatic dysfunction of cervical region: Secondary | ICD-10-CM | POA: Diagnosis not present

## 2016-11-30 ENCOUNTER — Ambulatory Visit (INDEPENDENT_AMBULATORY_CARE_PROVIDER_SITE_OTHER): Payer: BLUE CROSS/BLUE SHIELD | Admitting: Podiatry

## 2016-11-30 ENCOUNTER — Encounter: Payer: Self-pay | Admitting: Podiatry

## 2016-11-30 ENCOUNTER — Ambulatory Visit (INDEPENDENT_AMBULATORY_CARE_PROVIDER_SITE_OTHER): Payer: BLUE CROSS/BLUE SHIELD

## 2016-11-30 VITALS — BP 116/70 | HR 106 | Resp 16 | Ht 60.0 in | Wt 99.0 lb

## 2016-11-30 DIAGNOSIS — M2042 Other hammer toe(s) (acquired), left foot: Secondary | ICD-10-CM

## 2016-11-30 DIAGNOSIS — M21619 Bunion of unspecified foot: Secondary | ICD-10-CM

## 2016-11-30 DIAGNOSIS — M2010 Hallux valgus (acquired), unspecified foot: Secondary | ICD-10-CM | POA: Diagnosis not present

## 2016-11-30 NOTE — Patient Instructions (Addendum)
Bunionectomy A bunionectomy is a surgical procedure to remove a bunion. A bunion is a visible bump of bone on the inside of your foot where your big toe meets the rest of your foot. A bunion can develop when pressure turns this bone (first metatarsal) toward the other toes. Shoes that are too tight are the most common cause of bunions. Bunions can also be caused by diseases, such as arthritis and polio. You may need a bunionectomy if your bunion is very large and painful or it affects your ability to walk. Tell a health care provider about:  Any allergies you have.  All medicines you are taking, including vitamins, herbs, eye drops, creams, and over-the-counter medicines.  Any problems you or family members have had with anesthetic medicines.  Any blood disorders you have.  Any surgeries you have had.  Any medical conditions you have. What are the risks? Generally, this is a safe procedure. However, problems may occur, including:  Infection.  Pain.  Nerve damage.  Bleeding or blood clots.  Reactions to medicines.  Numbness, stiffness, or arthritis in your toe.  Foot problems that continue even after the procedure. What happens before the procedure?  Ask your health care provider about:  Changing or stopping your regular medicines. This is especially important if you are taking diabetes medicines or blood thinners.  Taking medicines such as aspirin and ibuprofen. These medicines can thin your blood. Do not take these medicines before your procedure if your health care provider instructs you not to.  Do not drink alcohol before the procedure as directed by your health care provider.  Do not use tobacco products, including cigarettes, chewing tobacco, or electronic cigarettes, before the procedure as directed by your health care provider. If you need help quitting, ask your health care provider.  Ask your health care provider what kind of medicine you will be given during  your procedure. A bunionectomy may be done using one of these:  A medicine that numbs the area (local anesthetic).  A medicine that makes you go to sleep (general anesthetic). If you will be given general anesthetic, do not eat or drink anything after midnight on the night before the procedure or as directed by your health care provider. What happens during the procedure?  An IV tube may be inserted into a vein.  You will be given local anesthetic or general anesthetic.  The surgeon will make a cut (incision) over the enlarged area at the first joint of the big toe. The surgeon will remove the bunion.  You may have more than one incision if any of the bones in your big toe need to be moved. A bone itself may need to be cut.  Sometimes the tissues around the big toe may also need to be cut then tightened or loosened to reposition the toe.  Screws or other hardware may be used to keep your foot in thecorrect position.  The incision will be closed with stitches (sutures) and covered with adhesive strips or another type of bandage (dressing). What happens after the procedure?  You may spend some time in a recovery area.  Your blood pressure, heart rate, breathing rate, and blood oxygen level will be monitored often until the medicines you were given have worn off. This information is not intended to replace advice given to you by your health care provider. Make sure you discuss any questions you have with your health care provider. Document Released: 09/01/2005 Document Revised: 02/24/2016 Document Reviewed: 05/06/2014   Elsevier Interactive Patient Education  2017 [employer]  Pre-Operative Instructions  Congratulations, you have decided to take an important step to improving your quality of life.  You can be assured that the doctors of [facility] will be with you every step of the way.  1. Plan to be at the surgery center/hospital at least 1 (one) hour prior to your scheduled  time unless otherwise directed by the surgical center/hospital staff.  You must have a responsible adult accompany you, remain during the surgery and drive you home.  Make sure you have directions to the surgical center/hospital and know how to get there on time. 2. For hospital based surgery you will need to obtain a history and physical form from your family physician within 1 month prior to the date of surgery- we will give you a form for you primary physician.  3. We make every effort to accommodate the date you request for surgery.  There are however, times where surgery dates or times have to be moved.  We will contact you as soon as possible if a change in schedule is required.   4. No Aspirin/Ibuprofen for one week before surgery.  If you are on aspirin, any non-steroidal anti-inflammatory medications (Mobic, Aleve, Ibuprofen) you should stop taking it 7 days prior to your surgery.  You make take Tylenol  For pain prior to surgery.  5. Medications- If you are taking daily heart and blood pressure medications, seizure, reflux, allergy, asthma, anxiety, pain or diabetes medications, make sure the surgery center/hospital is aware before the day of surgery so they may notify you which medications to take or avoid the day of surgery. 6. No food or drink after midnight the night before surgery unless directed otherwise by surgical center/hospital staff. 7. No alcoholic beverages 24 hours prior to surgery.  No smoking 24 hours prior to or 24 hours after surgery. 8. Wear loose pants or shorts- loose enough to fit over bandages, boots, and casts. 9. No slip on shoes, sneakers are best. 10. Bring your boot with you to the surgery center/hospital.  Also bring crutches or a walker if your physician has prescribed it for you.  If you do not have this equipment, it will be provided for you after surgery. 11. If you have not been contracted by the surgery center/hospital by the day before your surgery, call to  confirm the date and time of your surgery. 12. Leave-time from work may vary depending on the type of surgery you have.  Appropriate arrangements should be made prior to surgery with your employer. 13. Prescriptions will be provided immediately following surgery by your doctor.  Have these filled as soon as possible after surgery and take the medication as directed. 14. Remove nail polish on the operative foot. 15. Wash the night before surgery.  The night before surgery wash the foot and leg well with the antibacterial soap provided and water paying special attention to beneath the toenails and in between the toes.  Rinse thoroughly with water and dry well with a towel.  Perform this wash unless told not to do so by your physician.  Enclosed: 1 Ice pack (please put in freezer the night before surgery)   1 Hibiclens skin cleaner   Pre-op Instructions  If you have any questions regarding the instructions, do not hesitate to call our office.  [place]: [address] [place], [place] [address] [phone]  [place]: [address] [place], [place] [address] [phone]  [place]: [address]  [place], [place] [address] [phone]   Dr. Norman Regal DPM, Dr. Matthew Wagoner DPM, Dr. M. Todd Hyatt DPM, Dr. Titorya Stover DPM  

## 2016-11-30 NOTE — Progress Notes (Signed)
   Subjective:    Patient ID: Vanessa Lara, female    DOB: [birth year], 61 y.o.   MRN: [id number]  HPI Chief Complaint  Patient presents with  . Foot Pain    Right foot; 2nd toe & Bunion; pt stated, "Has diabetic nerve pain and feels like has a billion needles in toe; wants to discuss bunion surgery"      Review of Systems  All other systems reviewed and are negative.      Objective:   Physical Exam        Assessment & Plan:

## 2016-12-01 NOTE — Progress Notes (Signed)
Subjective:     Patient ID: Vanessa Lara, female   DOB: 04/24/1956, 61 y.o.   MRN: [id number]  HPI patient presents to discuss chronic structural bunion deformity and pain in the right second toe. Patient states it's been going on for a long time and gradually getting worse and making shoe gear very uncomfortable or any form of ambulation   Review of Systems  All other systems reviewed and are negative.      Objective:   Physical Exam  Constitutional: She is oriented to person, place, and time.  Cardiovascular: Intact distal pulses.   Musculoskeletal: Normal range of motion.  Neurological: She is oriented to person, place, and time.  Skin: Skin is warm.  Nursing note and vitals reviewed.  neurovascular status intact muscle strength was adequate range of motion within normal limits with patient having large hyperostosis medial aspect first metatarsal and right over left with deviation of the hallux against the second toe with keratotic lesion on the inside of the second toe. There is deformity within the big toe itself and patient's tried wider shoes has tried soaks and padding of the area without relief of symptoms. Left foot shows moderate deformity not to the same degree and patient was found to have good digital perfusion and is well oriented 3     Assessment:     Chronic structural bunion deformity right with deviation the hallux and keratotic lesion second toe secondary to pressure right foot over left    Plan:     H&P conditions discussed at great length. I do think this will require distal osteotomy right along with digital procedure second toe with the possibility for Akin osteotomy. I reviewed this at great length with patient and she wants surgery and since she is driving hear from a distance she like to go over consent form today. I reviewed with her the procedure and alternative treatments complications and discussed with her great leg surgical intervention in this  particular case. Patient wants surgery signed consent form after extensive review and is scheduled for outpatient surgery in is encouraged to call with any questions. Dispensed a air fracture walker with all instructions on usage and again encouraged her to call us should she have any questions concerning the bunionectomy and hammertoe repair and possible Akin osteotomy patient will be seen for surgery in the next several weeks  X-ray report indicates there is elevation of the intermetatarsal angle between the first second metatarsals of approximate 15 right 13 left with digital deformity and compression of the hallux against the second toe right over left

## 2016-12-19 ENCOUNTER — Encounter: Payer: Self-pay | Admitting: Podiatry

## 2016-12-19 DIAGNOSIS — M2570 Osteophyte, unspecified joint: Secondary | ICD-10-CM | POA: Diagnosis not present

## 2016-12-19 DIAGNOSIS — M2011 Hallux valgus (acquired), right foot: Secondary | ICD-10-CM | POA: Diagnosis not present

## 2016-12-19 DIAGNOSIS — M21611 Bunion of right foot: Secondary | ICD-10-CM | POA: Diagnosis not present

## 2016-12-19 DIAGNOSIS — M2041 Other hammer toe(s) (acquired), right foot: Secondary | ICD-10-CM | POA: Diagnosis not present

## 2016-12-19 DIAGNOSIS — M25774 Osteophyte, right foot: Secondary | ICD-10-CM | POA: Diagnosis not present

## 2016-12-19 DIAGNOSIS — E7800 Pure hypercholesterolemia, unspecified: Secondary | ICD-10-CM | POA: Diagnosis not present

## 2016-12-26 ENCOUNTER — Ambulatory Visit (INDEPENDENT_AMBULATORY_CARE_PROVIDER_SITE_OTHER): Payer: BLUE CROSS/BLUE SHIELD | Admitting: Podiatry

## 2016-12-26 ENCOUNTER — Ambulatory Visit (INDEPENDENT_AMBULATORY_CARE_PROVIDER_SITE_OTHER): Payer: BLUE CROSS/BLUE SHIELD

## 2016-12-26 ENCOUNTER — Encounter: Payer: Self-pay | Admitting: Podiatry

## 2016-12-26 VITALS — Temp 98.0°F

## 2016-12-26 DIAGNOSIS — Z9889 Other specified postprocedural states: Secondary | ICD-10-CM

## 2016-12-26 DIAGNOSIS — M21619 Bunion of unspecified foot: Secondary | ICD-10-CM

## 2016-12-26 NOTE — Progress Notes (Signed)
Subjective:     Patient ID: Vanessa Lara, female   DOB: Nov 28, 1955, 61 y.o.   MRN: [id number]  Patient presents stating that she's doing real well with surgery with mild pain and swelling     Review of Systems     Objective:   Physical Exam Neurovascular status intact muscle strength adequate with range of motion within normal limits with patient having negative Homans sign and well coapted incision sites hallux first and fourth toe and in place second toe and no   abnormal appearance  Assessment:     Doing well postop surgery    Plan:     Reviewed condition and at this time applied a BioSkin above ankle brace to lower and plantarflexed the second toe and went ahead and applied compression and instructed on continued elevation immobilization and reappoint 2 weeks or earlier if needed  X-ray report indicates good alignment with structural pins plates and wires in place

## 2016-12-28 ENCOUNTER — Other Ambulatory Visit: Payer: BLUE CROSS/BLUE SHIELD

## 2017-01-09 ENCOUNTER — Ambulatory Visit: Payer: BLUE CROSS/BLUE SHIELD | Admitting: Podiatry

## 2017-01-11 ENCOUNTER — Ambulatory Visit (INDEPENDENT_AMBULATORY_CARE_PROVIDER_SITE_OTHER): Payer: BLUE CROSS/BLUE SHIELD

## 2017-01-11 ENCOUNTER — Ambulatory Visit (INDEPENDENT_AMBULATORY_CARE_PROVIDER_SITE_OTHER): Payer: BLUE CROSS/BLUE SHIELD | Admitting: Podiatry

## 2017-01-11 DIAGNOSIS — Z9889 Other specified postprocedural states: Secondary | ICD-10-CM

## 2017-01-11 DIAGNOSIS — M21619 Bunion of unspecified foot: Secondary | ICD-10-CM

## 2017-01-11 DIAGNOSIS — M2042 Other hammer toe(s) (acquired), left foot: Secondary | ICD-10-CM | POA: Diagnosis not present

## 2017-01-14 NOTE — Progress Notes (Signed)
Subjective:     Patient ID: Vanessa Lara, female   DOB: Jun 07, 1956, 61 y.o.   MRN: [id number]  HPI patient presents stating she's doing real well but the patient and her second toe has gotten loose   Review of Systems     Objective:   Physical Exam Neurovascular status intact with good alignment of first metatarsal hallux with pin that has moved in a distal direction by approximately 1 inch from the second digit    Assessment:     Abnormal pin position right second toe with probable deformity    Plan:     H&P condition reviewed and pin removed at this time and sterile dressing applied. I instructed on holding the toe down and gave her a appropriate type of above ankle bracing system in order to accomplish this and I advised on elevation and will be seen back in the next several weeks  X-rays indicate good alignment with good structural position and everything intact

## 2017-01-22 DIAGNOSIS — M545 Low back pain: Secondary | ICD-10-CM | POA: Diagnosis not present

## 2017-01-22 DIAGNOSIS — M9903 Segmental and somatic dysfunction of lumbar region: Secondary | ICD-10-CM | POA: Diagnosis not present

## 2017-01-22 DIAGNOSIS — M546 Pain in thoracic spine: Secondary | ICD-10-CM | POA: Diagnosis not present

## 2017-01-22 DIAGNOSIS — M9902 Segmental and somatic dysfunction of thoracic region: Secondary | ICD-10-CM | POA: Diagnosis not present

## 2017-01-25 ENCOUNTER — Encounter: Payer: Self-pay | Admitting: Podiatry

## 2017-01-25 ENCOUNTER — Ambulatory Visit (INDEPENDENT_AMBULATORY_CARE_PROVIDER_SITE_OTHER): Payer: BLUE CROSS/BLUE SHIELD

## 2017-01-25 ENCOUNTER — Ambulatory Visit (INDEPENDENT_AMBULATORY_CARE_PROVIDER_SITE_OTHER): Payer: Self-pay | Admitting: Podiatry

## 2017-01-25 DIAGNOSIS — M21619 Bunion of unspecified foot: Secondary | ICD-10-CM | POA: Diagnosis not present

## 2017-01-25 DIAGNOSIS — M79674 Pain in right toe(s): Secondary | ICD-10-CM

## 2017-01-25 MED ORDER — TRAMADOL HCL 50 MG PO TABS: 50.0000 mg | ORAL_TABLET | Freq: Three times a day (TID) | ORAL | 2 refills | Status: DC

## 2017-01-27 NOTE — Progress Notes (Signed)
Subjective:    Patient ID: Vanessa Lara, female   DOB: 61 y.o.   MRN: [id number]   HPI patient states in the last few days that the big toe has started to bother her and she was just concerned and wanted to make sure it was okay    ROS      Objective:  Physical Exam Neurovascular status intact with patient's right hallux looking good with no significant inflammation with good alignment and slight elevation of the hallux 6 self with good flexor tendon motion    Assessment:    Patient may have overdone it a she's been working quite a bit and standing on her foot creating inflammatory complex     Plan:    H&P x-ray reviewed and discussed continued being careful with this and wearing protective-type shoes and immobilization. Ultimate healing should occur and I instructed her on the flexor tendon and it's been slight lifting of the distal portion of the right hallux after Akin osteotomy but I do not see this as being long-term pathological for her  X-rays indicate that the [facility] osteotomy is healed very well and there is slight lifting at the interphalangeal joint right big toe with the wire holding well and just slight dorsal lifting at the osteotomy

## 2017-02-05 DIAGNOSIS — M545 Low back pain: Secondary | ICD-10-CM | POA: Diagnosis not present

## 2017-02-05 DIAGNOSIS — M9902 Segmental and somatic dysfunction of thoracic region: Secondary | ICD-10-CM | POA: Diagnosis not present

## 2017-02-05 DIAGNOSIS — M9903 Segmental and somatic dysfunction of lumbar region: Secondary | ICD-10-CM | POA: Diagnosis not present

## 2017-02-05 DIAGNOSIS — M546 Pain in thoracic spine: Secondary | ICD-10-CM | POA: Diagnosis not present

## 2017-02-08 ENCOUNTER — Ambulatory Visit (INDEPENDENT_AMBULATORY_CARE_PROVIDER_SITE_OTHER): Payer: BLUE CROSS/BLUE SHIELD

## 2017-02-08 ENCOUNTER — Ambulatory Visit (INDEPENDENT_AMBULATORY_CARE_PROVIDER_SITE_OTHER): Payer: Self-pay | Admitting: Podiatry

## 2017-02-08 DIAGNOSIS — M21619 Bunion of unspecified foot: Secondary | ICD-10-CM | POA: Diagnosis not present

## 2017-02-08 DIAGNOSIS — Z9889 Other specified postprocedural states: Secondary | ICD-10-CM

## 2017-02-08 NOTE — Progress Notes (Signed)
DOS 12/19/2016 [place] Bunionectomy(cutting and moving bone) with pin fixation right foot : poss  Aiken with wire big toe: arthoplasty 2nd right

## 2017-02-09 NOTE — Progress Notes (Signed)
Subjective:    Patient ID: Vanessa Lara, female   DOB: 61 y.o.   MRN: [id number]   HPI patient states that she's doing well with her surgery but big toe right feels like it's hypermobile and she still gets swelling if she's on it too long    ROS      Objective:  Physical Exam Neurovascular status intact with patient's right foot doing well with excellent alignment and wound edges on the first MPJ hallux and second digit healing well with good alignment noted    Assessment:    Doing well post forefoot reconstruction right     Plan:    H&P condition reviewed and I've advised this patient on continued supportive shoe gear usage and patient will be seen back for [place] to recheck again in about 6 weeks and gradually can return to soft shoes  X-rays indicate the osteotomies are healing well with pins screws and wire in place

## 2017-03-02 ENCOUNTER — Encounter (INDEPENDENT_AMBULATORY_CARE_PROVIDER_SITE_OTHER): Payer: Self-pay | Admitting: Internal Medicine

## 2017-03-08 ENCOUNTER — Ambulatory Visit (INDEPENDENT_AMBULATORY_CARE_PROVIDER_SITE_OTHER): Payer: BLUE CROSS/BLUE SHIELD | Admitting: Podiatry

## 2017-03-08 ENCOUNTER — Ambulatory Visit (INDEPENDENT_AMBULATORY_CARE_PROVIDER_SITE_OTHER): Payer: BLUE CROSS/BLUE SHIELD

## 2017-03-08 DIAGNOSIS — M2011 Hallux valgus (acquired), right foot: Secondary | ICD-10-CM

## 2017-03-08 DIAGNOSIS — M779 Enthesopathy, unspecified: Secondary | ICD-10-CM | POA: Diagnosis not present

## 2017-03-08 DIAGNOSIS — M21619 Bunion of unspecified foot: Secondary | ICD-10-CM

## 2017-03-08 DIAGNOSIS — Z9889 Other specified postprocedural states: Secondary | ICD-10-CM

## 2017-03-08 MED ORDER — TRIAMCINOLONE ACETONIDE 10 MG/ML IJ SUSP
10.0000 mg | Freq: Once | INTRAMUSCULAR | Status: AC
  Administered 2017-03-08: 10 mg

## 2017-03-08 NOTE — Progress Notes (Signed)
Subjective:    Patient ID: Vanessa Lara, female   DOB: 61 y.o.   MRN: [id number]   HPI patient states she still getting some pain in the bottom of the first metatarsal and her big toe feels real flexible and also she's developed a lot of pain in the fourth digit right foot stating it's been inflamed    ROS      Objective:  Physical Exam neurovascular status intact with patient found have excellent motion first MPJ right with excessive motion at the interphalangeal joint first metatarsal hallux right with irritated fourth digit right that is inflamed at the distal interphalangeal joint with small keratotic lesion     Assessment:     The first metatarsal head does not feel excessively prominent in comparison to the other but I do believe there may be increased blood flow creating some inflammation along with hyperflexibility of the interphalangeal joint right hallux with slight lifting of the bone secondary to healing from the Akin osteotomy cut but overall she is still doing better than preoperatively and I believe it'll continue get better. Appears to be some form of interphalangeal joint capsulitis with possible lesion fourth digit right     Plan:    H&P and all conditions discussed and we will consider orthotics for the right one. Today I went ahead and I did a proximal nerve block of the right fourth digit and then didn't interphalangeal injection of the fourth digit with 2 mg Dexon the center mill grams Kenalog and debrided the lesion applied padding and we'll reevaluate again in 2 months or earlier if needed  X-rays indicate the bone is healed well with pins in place wire is not disrupted at all with possibility for some lifting of the distal segment of the osteotomy cut due to softness of bone and stress

## 2017-03-09 NOTE — Patient Instructions (Signed)

## 2017-03-14 ENCOUNTER — Ambulatory Visit (INDEPENDENT_AMBULATORY_CARE_PROVIDER_SITE_OTHER): Payer: BLUE CROSS/BLUE SHIELD | Admitting: Internal Medicine

## 2017-04-02 DIAGNOSIS — J0100 Acute maxillary sinusitis, unspecified: Secondary | ICD-10-CM | POA: Diagnosis not present

## 2017-04-02 DIAGNOSIS — M130 Polyarthritis, unspecified: Secondary | ICD-10-CM | POA: Diagnosis not present

## 2017-04-02 DIAGNOSIS — G4700 Insomnia, unspecified: Secondary | ICD-10-CM | POA: Diagnosis not present

## 2017-04-02 DIAGNOSIS — F321 Major depressive disorder, single episode, moderate: Secondary | ICD-10-CM | POA: Diagnosis not present

## 2017-04-20 DIAGNOSIS — F17200 Nicotine dependence, unspecified, uncomplicated: Secondary | ICD-10-CM | POA: Diagnosis not present

## 2017-04-20 DIAGNOSIS — G4700 Insomnia, unspecified: Secondary | ICD-10-CM | POA: Diagnosis not present

## 2017-04-20 DIAGNOSIS — J019 Acute sinusitis, unspecified: Secondary | ICD-10-CM | POA: Diagnosis not present

## 2017-04-20 DIAGNOSIS — F321 Major depressive disorder, single episode, moderate: Secondary | ICD-10-CM | POA: Diagnosis not present

## 2017-04-23 DIAGNOSIS — J0100 Acute maxillary sinusitis, unspecified: Secondary | ICD-10-CM | POA: Diagnosis not present

## 2017-04-23 DIAGNOSIS — R05 Cough: Secondary | ICD-10-CM | POA: Diagnosis not present

## 2017-04-30 ENCOUNTER — Emergency Department (HOSPITAL_COMMUNITY)
Admission: EM | Admit: 2017-04-30 | Discharge: 2017-04-30 | Disposition: A | Discharge status: Home / Self Care | Payer: BLUE CROSS/BLUE SHIELD | Attending: Emergency Medicine | Admitting: Emergency Medicine

## 2017-04-30 ENCOUNTER — Encounter (HOSPITAL_COMMUNITY): Payer: Self-pay

## 2017-04-30 ENCOUNTER — Emergency Department (HOSPITAL_COMMUNITY): Payer: BLUE CROSS/BLUE SHIELD

## 2017-04-30 DIAGNOSIS — Y999 Unspecified external cause status: Secondary | ICD-10-CM | POA: Insufficient documentation

## 2017-04-30 DIAGNOSIS — Y92410 Unspecified street and highway as the place of occurrence of the external cause: Secondary | ICD-10-CM | POA: Diagnosis not present

## 2017-04-30 DIAGNOSIS — Z79899 Other long term (current) drug therapy: Secondary | ICD-10-CM | POA: Insufficient documentation

## 2017-04-30 DIAGNOSIS — R6884 Jaw pain: Secondary | ICD-10-CM | POA: Insufficient documentation

## 2017-04-30 DIAGNOSIS — Y9389 Activity, other specified: Secondary | ICD-10-CM | POA: Diagnosis not present

## 2017-04-30 DIAGNOSIS — Z87891 Personal history of nicotine dependence: Secondary | ICD-10-CM | POA: Diagnosis not present

## 2017-04-30 DIAGNOSIS — S0990XA Unspecified injury of head, initial encounter: Secondary | ICD-10-CM

## 2017-04-30 DIAGNOSIS — R51 Headache: Secondary | ICD-10-CM | POA: Insufficient documentation

## 2017-04-30 MED ORDER — METAXALONE 400 MG PO TABS: 400.0000 mg | ORAL_TABLET | Freq: Three times a day (TID) | ORAL | 0 refills | Status: DC | PRN

## 2017-04-30 NOTE — ED Notes (Signed)
MD at bedside. 
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
## 2017-05-07 ENCOUNTER — Ambulatory Visit (INDEPENDENT_AMBULATORY_CARE_PROVIDER_SITE_OTHER): Payer: BLUE CROSS/BLUE SHIELD | Admitting: Otolaryngology

## 2017-05-07 DIAGNOSIS — J310 Chronic rhinitis: Secondary | ICD-10-CM | POA: Diagnosis not present

## 2017-05-07 DIAGNOSIS — M546 Pain in thoracic spine: Secondary | ICD-10-CM | POA: Diagnosis not present

## 2017-05-07 DIAGNOSIS — J343 Hypertrophy of nasal turbinates: Secondary | ICD-10-CM | POA: Diagnosis not present

## 2017-05-07 DIAGNOSIS — M9902 Segmental and somatic dysfunction of thoracic region: Secondary | ICD-10-CM | POA: Diagnosis not present

## 2017-05-07 DIAGNOSIS — M545 Low back pain: Secondary | ICD-10-CM | POA: Diagnosis not present

## 2017-05-07 DIAGNOSIS — M9903 Segmental and somatic dysfunction of lumbar region: Secondary | ICD-10-CM | POA: Diagnosis not present

## 2017-05-10 ENCOUNTER — Ambulatory Visit: Payer: BLUE CROSS/BLUE SHIELD

## 2017-05-10 ENCOUNTER — Ambulatory Visit (INDEPENDENT_AMBULATORY_CARE_PROVIDER_SITE_OTHER): Payer: BLUE CROSS/BLUE SHIELD

## 2017-05-10 ENCOUNTER — Other Ambulatory Visit: Payer: Self-pay | Admitting: Podiatry

## 2017-05-10 ENCOUNTER — Encounter: Payer: Self-pay | Admitting: Podiatry

## 2017-05-10 ENCOUNTER — Ambulatory Visit (INDEPENDENT_AMBULATORY_CARE_PROVIDER_SITE_OTHER): Payer: BLUE CROSS/BLUE SHIELD | Admitting: Podiatry

## 2017-05-10 DIAGNOSIS — M2042 Other hammer toe(s) (acquired), left foot: Secondary | ICD-10-CM

## 2017-05-10 DIAGNOSIS — M2011 Hallux valgus (acquired), right foot: Secondary | ICD-10-CM

## 2017-05-10 DIAGNOSIS — M779 Enthesopathy, unspecified: Secondary | ICD-10-CM

## 2017-05-10 DIAGNOSIS — M79672 Pain in left foot: Secondary | ICD-10-CM

## 2017-05-10 MED ORDER — TRIAMCINOLONE ACETONIDE 10 MG/ML IJ SUSP
10.0000 mg | Freq: Once | INTRAMUSCULAR | Status: AC
  Administered 2017-05-10: 10 mg

## 2017-05-10 NOTE — Progress Notes (Signed)
Subjective:    Patient ID: Vanessa Lara, female   DOB: 61 y.o.   MRN: [id number]   HPI patient states doing pretty well with the right foot but has started develop a lot of pain in the top of the left and the left second toe seems to be moving    ROS      Objective:  Physical Exam neurovascular status intact with patient continued have excessive motion at the interphalangeal joint bilateral hallux with lifting the hallux right over left. Patient also is noted to have medial dislocation second digit left with mild lifting and quite a bit of discomfort with fluid buildup in the second MPJ left foot     Assessment:    Has done well with surgery with mild hyperflexibility of the interphalangeal joint hallux right over left with inflammatory changes capsulitis of the second MPJ left foot     Plan:     H&P x-rays reviewed and today I am focusing on the left foot. I did a proximal nerve block and then aspirated the joint getting out a small amount of serous symptoms fluid and injected with a quarter cc deck some some Kenalog and then applied and above ankle BioSkin brace to provide both compression and also to plantarflex and laterally relocate the second digit on the metatarsal. I did explain the toe will probably popped back to the position it's and now but I'm hoping this will keep it from becoming a worse problem. Reappoint to recheck in 3 weeks or earlier if needed  X-rays indicate that there is slight medial dislocation second digit with no indication stress fracture arthritis in the right foot has healed well with slight healing from the Akin in a lifted position but most of the deformity is at the interphalangeal joint hallux right over left

## 2017-05-15 DIAGNOSIS — M064 Inflammatory polyarthropathy: Secondary | ICD-10-CM | POA: Diagnosis not present

## 2017-05-15 DIAGNOSIS — M2550 Pain in unspecified joint: Secondary | ICD-10-CM | POA: Diagnosis not present

## 2017-05-15 DIAGNOSIS — M79673 Pain in unspecified foot: Secondary | ICD-10-CM | POA: Diagnosis not present

## 2017-05-15 DIAGNOSIS — M12841 Other specific arthropathies, not elsewhere classified, right hand: Secondary | ICD-10-CM | POA: Diagnosis not present

## 2017-05-15 DIAGNOSIS — M12842 Other specific arthropathies, not elsewhere classified, left hand: Secondary | ICD-10-CM | POA: Diagnosis not present

## 2017-05-15 DIAGNOSIS — M79643 Pain in unspecified hand: Secondary | ICD-10-CM | POA: Diagnosis not present

## 2017-05-31 ENCOUNTER — Encounter: Payer: BLUE CROSS/BLUE SHIELD | Admitting: Podiatry

## 2017-06-12 DIAGNOSIS — M79673 Pain in unspecified foot: Secondary | ICD-10-CM | POA: Diagnosis not present

## 2017-06-12 DIAGNOSIS — M0579 Rheumatoid arthritis with rheumatoid factor of multiple sites without organ or systems involvement: Secondary | ICD-10-CM | POA: Diagnosis not present

## 2017-06-12 DIAGNOSIS — M2550 Pain in unspecified joint: Secondary | ICD-10-CM | POA: Diagnosis not present

## 2017-06-12 DIAGNOSIS — M79643 Pain in unspecified hand: Secondary | ICD-10-CM | POA: Diagnosis not present

## 2017-06-13 ENCOUNTER — Ambulatory Visit: Payer: BLUE CROSS/BLUE SHIELD

## 2017-06-13 ENCOUNTER — Ambulatory Visit (INDEPENDENT_AMBULATORY_CARE_PROVIDER_SITE_OTHER): Payer: BLUE CROSS/BLUE SHIELD | Admitting: Podiatry

## 2017-06-13 ENCOUNTER — Encounter: Payer: Self-pay | Admitting: Podiatry

## 2017-06-13 DIAGNOSIS — M779 Enthesopathy, unspecified: Secondary | ICD-10-CM | POA: Diagnosis not present

## 2017-06-13 DIAGNOSIS — M2011 Hallux valgus (acquired), right foot: Secondary | ICD-10-CM

## 2017-06-13 DIAGNOSIS — M79672 Pain in left foot: Secondary | ICD-10-CM | POA: Diagnosis not present

## 2017-06-13 DIAGNOSIS — M2042 Other hammer toe(s) (acquired), left foot: Secondary | ICD-10-CM | POA: Diagnosis not present

## 2017-06-13 NOTE — Progress Notes (Signed)
Subjective:    Patient ID: Vanessa Lara, female   DOB: 61 y.o.   MRN: [id number]   HPI patient states she's feeling much better with diminished discomfort    ROS      Objective:  Physical Exam neurovascular status intact with patient's inflammation on the left foot improving with still mild to moderate discomfort in the right foot hallux doing much better with excellent healing of the incision site from previous osteotomy surgery     Assessment:    Doing well overall osteotomy right and capsulitis left     Plan:   Reviewed both conditions and I recommended at this time continued rigid bottom shoes and protection. May require orthotics or other treatment depending on response

## 2017-06-25 ENCOUNTER — Ambulatory Visit (INDEPENDENT_AMBULATORY_CARE_PROVIDER_SITE_OTHER): Payer: BLUE CROSS/BLUE SHIELD | Admitting: Physician Assistant

## 2017-06-25 DIAGNOSIS — M25531 Pain in right wrist: Secondary | ICD-10-CM

## 2017-06-25 MED ORDER — LIDOCAINE HCL 1 % IJ SOLN
1.0000 mL | INTRAMUSCULAR | Status: AC | PRN
  Administered 2017-06-25: 1 mL

## 2017-06-25 MED ORDER — BUPIVACAINE HCL 0.5 % IJ SOLN
1.0000 mL | INTRAMUSCULAR | Status: AC | PRN
  Administered 2017-06-25: 1 mL

## 2017-06-25 NOTE — Progress Notes (Signed)
Office Visit Note   Patient: Vanessa Lara           Date of Birth: 1956-04-11           MRN: [id number] Visit Date: 06/25/2017              Requested by: Sinda Du, [address], [place] [id number] PCP: Sinda Du, MD   Assessment & Plan: Visit Diagnoses:  1. Pain in right wrist     Plan:We'll have her wear her wrist splint for, for on the right hand. She'll follow with [place] on an as-needed basis if pain persists or becomes worse.  Follow-Up Instructions: Return if symptoms worsen or fail to improve.   Orders:  Orders Placed This Encounter  Procedures  . Hand/Upper Extremity Injection/Arthrocentesis   No orders of the defined types were placed in this encounter.     Procedures: Hand/UE Inj Date/Time: 06/25/2017 4:19 PM Performed by: Pete Pelt Authorized by: Pete Pelt   Indications:  Pain Condition comment:  Wrist pain Needle Size:  25 G Approach:  Dorsal Medications:  1 mL lidocaine 1 %; 1 mL bupivacaine 0.5 %     Clinical Data: No additional findings.   Subjective: No chief complaint on file.   HPI Vanessa Lara is well known to Dr. Ninfa Linden services comes in today again for right wrist pain. She states is similar to pain she had a year ago. She did undergo an MRI of her wrist July 2017 and this showed some debris in the distal radial ulnar joint, intermediate and patchy marrow edema distal ulna which was felt to maybe early represent an atypical dissecting ganglion. She's had no new injury. Pain began this past Wednesday. She is asking for an injection. Review of Systems No fevers chills shortness breath chest pain.  Objective: Vital Signs: There were no vitals taken for this visit.  Physical Exam  Constitutional: She is oriented to person, place, and time. She appears well-developed and well-nourished. No distress.  Neurological: She is alert and oriented to person, place, and time.  Skin: She is not  diaphoretic.  Psychiatric: She has a normal mood and affect.    Ortho Exam Right hand sensation grossly intact. No rashes skin lesions ulcerations erythema or ecchymosis. No triggering fingers. She's tender over the distal ulna dorsally. Pain with ulnar deviation no pain with radial deviation. Specialty Comments:  No specialty comments available.  Imaging: No results found.   PMFS History: Patient Active Problem List   Diagnosis Date Noted  . Constipation 03/14/2016  . Mitral valve prolapse 11/23/2015  . High cholesterol 11/23/2015   Past Medical History:  Diagnosis Date  . Arthritis   . Depression   . Endometriosis   . High cholesterol   . IBS (irritable bowel syndrome)   . Mitral valve prolapse   . Uterus, adenomyosis     Family History  Problem Relation Age of Onset  . Colon cancer Other     Past Surgical History:  Procedure Laterality Date  . COLONOSCOPY    . COLONOSCOPY N/A 12/13/2015   Procedure: COLONOSCOPY;  Surgeon: Rogene Houston, MD;  Location: [facility];  Service: Endoscopy;  Laterality: N/A;  9:55  . Fatty tumor     2011 (left arm)  . TONSILLECTOMY    . TOTAL ABDOMINAL HYSTERECTOMY     2003   Social History   Occupational History  . Not on file.   Social History  Main Topics  . Smoking status: Former Smoker    Packs/day: 0.50    Years: 33.00    Types: Cigarettes  . Smokeless tobacco: Never Used     Comment: quit 3 yrs ago  . Alcohol use No  . Drug use: No  . Sexual activity: Not on file

## 2017-07-11 DIAGNOSIS — Z23 Encounter for immunization: Secondary | ICD-10-CM | POA: Diagnosis not present

## 2017-07-11 DIAGNOSIS — M79643 Pain in unspecified hand: Secondary | ICD-10-CM | POA: Diagnosis not present

## 2017-07-11 DIAGNOSIS — M0579 Rheumatoid arthritis with rheumatoid factor of multiple sites without organ or systems involvement: Secondary | ICD-10-CM | POA: Diagnosis not present

## 2017-07-11 DIAGNOSIS — M2550 Pain in unspecified joint: Secondary | ICD-10-CM | POA: Diagnosis not present

## 2017-07-11 DIAGNOSIS — M79673 Pain in unspecified foot: Secondary | ICD-10-CM | POA: Diagnosis not present

## 2017-07-16 DIAGNOSIS — Z961 Presence of intraocular lens: Secondary | ICD-10-CM | POA: Diagnosis not present

## 2017-07-16 DIAGNOSIS — H10413 Chronic giant papillary conjunctivitis, bilateral: Secondary | ICD-10-CM | POA: Diagnosis not present

## 2017-07-16 DIAGNOSIS — H40033 Anatomical narrow angle, bilateral: Secondary | ICD-10-CM | POA: Diagnosis not present

## 2017-08-06 ENCOUNTER — Ambulatory Visit (INDEPENDENT_AMBULATORY_CARE_PROVIDER_SITE_OTHER): Payer: BLUE CROSS/BLUE SHIELD | Admitting: Otolaryngology

## 2017-08-09 ENCOUNTER — Encounter: Payer: Self-pay | Admitting: Podiatry

## 2017-08-09 ENCOUNTER — Ambulatory Visit (INDEPENDENT_AMBULATORY_CARE_PROVIDER_SITE_OTHER): Payer: BLUE CROSS/BLUE SHIELD | Admitting: Podiatry

## 2017-08-09 ENCOUNTER — Ambulatory Visit (INDEPENDENT_AMBULATORY_CARE_PROVIDER_SITE_OTHER): Payer: BLUE CROSS/BLUE SHIELD

## 2017-08-09 ENCOUNTER — Other Ambulatory Visit: Payer: Self-pay | Admitting: Podiatry

## 2017-08-09 DIAGNOSIS — M2042 Other hammer toe(s) (acquired), left foot: Secondary | ICD-10-CM | POA: Diagnosis not present

## 2017-08-09 DIAGNOSIS — M79672 Pain in left foot: Secondary | ICD-10-CM

## 2017-08-09 DIAGNOSIS — M7750 Other enthesopathy of unspecified foot: Secondary | ICD-10-CM | POA: Diagnosis not present

## 2017-08-09 DIAGNOSIS — M79671 Pain in right foot: Secondary | ICD-10-CM

## 2017-08-09 DIAGNOSIS — M2040 Other hammer toe(s) (acquired), unspecified foot: Secondary | ICD-10-CM

## 2017-08-09 DIAGNOSIS — M779 Enthesopathy, unspecified: Secondary | ICD-10-CM

## 2017-08-09 MED ORDER — TRIAMCINOLONE ACETONIDE 10 MG/ML IJ SUSP
10.0000 mg | Freq: Once | INTRAMUSCULAR | Status: AC
  Administered 2017-08-09: 10 mg

## 2017-08-09 NOTE — Progress Notes (Signed)
Subjective:    Patient ID: Vanessa Lara, female   DOB: 61 y.o.   MRN: [id number]   HPI patient states she's having a lot of pain in her left over right foot and has been recently diagnosed with rheumatoid arthritis and was placed on methotrexate which she did not do well with and is going to start a biologic    ROS      Objective:  Physical Exam neurovascular status intact with inflammation and pain of the second MPJ left with dorsal medial dislocation of the toe and mild inflammation of the lesser MPJs left and right foot well-healed surgical site     Assessment:  Inflammatory capsulitis second MPJ left with possibility for flexor plate injury along with possibility for systemic rheumatoid flareup and digital hammertoe deformity       Plan:    H&P conditions reviewed with patient. At this point I did a forefoot block left I aspirated the second MPJ getting out of small amount of clear fluid and injected quarter cc deck some some Kenalog and applied thick pad to take pressure off the metatarsals. Discussed a very soft orthotic to try to take pressure off her feet and we will make that decision at next visit  X-rays were negative for signs of lysis or any kind of decalcification associated with rheumatoid arthritis

## 2017-08-13 DIAGNOSIS — M2550 Pain in unspecified joint: Secondary | ICD-10-CM | POA: Diagnosis not present

## 2017-08-13 DIAGNOSIS — M79643 Pain in unspecified hand: Secondary | ICD-10-CM | POA: Diagnosis not present

## 2017-08-13 DIAGNOSIS — M79673 Pain in unspecified foot: Secondary | ICD-10-CM | POA: Diagnosis not present

## 2017-08-13 DIAGNOSIS — M0579 Rheumatoid arthritis with rheumatoid factor of multiple sites without organ or systems involvement: Secondary | ICD-10-CM | POA: Diagnosis not present

## 2017-08-22 DIAGNOSIS — M0579 Rheumatoid arthritis with rheumatoid factor of multiple sites without organ or systems involvement: Secondary | ICD-10-CM | POA: Diagnosis not present

## 2017-08-30 ENCOUNTER — Ambulatory Visit: Payer: BLUE CROSS/BLUE SHIELD | Admitting: Podiatry

## 2017-09-05 ENCOUNTER — Ambulatory Visit: Payer: BLUE CROSS/BLUE SHIELD | Admitting: Podiatry

## 2017-09-13 ENCOUNTER — Encounter: Payer: Self-pay | Admitting: Podiatry

## 2017-09-13 ENCOUNTER — Ambulatory Visit (INDEPENDENT_AMBULATORY_CARE_PROVIDER_SITE_OTHER): Payer: BLUE CROSS/BLUE SHIELD | Admitting: Podiatry

## 2017-09-13 ENCOUNTER — Ambulatory Visit (INDEPENDENT_AMBULATORY_CARE_PROVIDER_SITE_OTHER): Payer: BLUE CROSS/BLUE SHIELD

## 2017-09-13 DIAGNOSIS — M2042 Other hammer toe(s) (acquired), left foot: Secondary | ICD-10-CM | POA: Diagnosis not present

## 2017-09-13 DIAGNOSIS — M779 Enthesopathy, unspecified: Secondary | ICD-10-CM

## 2017-09-13 NOTE — Progress Notes (Signed)
Subjective:   Patient ID: Vanessa Lara, female   DOB: 61 y.o.   MRN: [id number]   HPI Patient states she started the new infusion therapy for her rheumatoid arthritis and she continues to have trouble with her right big toe her right fourth toe and on her left foot she states the plantar capsule is improved but still sore.   ROS      Objective:  Physical Exam  Neurovascular status intact with a very hypermobile inner phalangeal joint of the right big toe with excellent healing of previous osteotomy with discomfort on the dorsal surface of the toe and irritation of the distal interphalangeal joint digit 4 right.  Left still has pain in the second MPJ.     Assessment:  Hallux mellitus deformity right hallux with excessive motion at the interphalangeal joint and hammertoe deformity fourth right along with inflammation of the left second MPJ     Plan:  H&P and x-rays of the right foot reviewed and I recommended long-term hallux interphalangeal joint fusion and reviewed this case with Dr. March Rummage who saw the patient and agreed with my diagnosis.  Also arthroplasty of the fourth digit right distal can be undertaken for the left foot will get a utilize a soft orthotic to try to reduce plantar pressure on the foot.  X-ray indicates there is hypermobility at the inner phalangeal joint right hallux with fixation in place first metatarsal and proximal phalanx of the hallux

## 2017-09-20 DIAGNOSIS — Z79899 Other long term (current) drug therapy: Secondary | ICD-10-CM | POA: Diagnosis not present

## 2017-09-20 DIAGNOSIS — D509 Iron deficiency anemia, unspecified: Secondary | ICD-10-CM | POA: Diagnosis not present

## 2017-09-20 DIAGNOSIS — M0579 Rheumatoid arthritis with rheumatoid factor of multiple sites without organ or systems involvement: Secondary | ICD-10-CM | POA: Diagnosis not present

## 2017-10-10 DIAGNOSIS — M79643 Pain in unspecified hand: Secondary | ICD-10-CM | POA: Diagnosis not present

## 2017-10-10 DIAGNOSIS — M79673 Pain in unspecified foot: Secondary | ICD-10-CM | POA: Diagnosis not present

## 2017-10-10 DIAGNOSIS — M0579 Rheumatoid arthritis with rheumatoid factor of multiple sites without organ or systems involvement: Secondary | ICD-10-CM | POA: Diagnosis not present

## 2017-10-10 DIAGNOSIS — M2550 Pain in unspecified joint: Secondary | ICD-10-CM | POA: Diagnosis not present

## 2017-10-11 ENCOUNTER — Other Ambulatory Visit: Payer: BLUE CROSS/BLUE SHIELD | Admitting: Orthotics

## 2017-10-11 ENCOUNTER — Other Ambulatory Visit (HOSPITAL_COMMUNITY): Payer: Self-pay | Admitting: Pulmonary Disease

## 2017-10-11 DIAGNOSIS — E876 Hypokalemia: Secondary | ICD-10-CM | POA: Diagnosis not present

## 2017-10-11 DIAGNOSIS — R42 Dizziness and giddiness: Secondary | ICD-10-CM | POA: Diagnosis not present

## 2017-10-11 DIAGNOSIS — M069 Rheumatoid arthritis, unspecified: Secondary | ICD-10-CM | POA: Diagnosis not present

## 2017-10-11 DIAGNOSIS — R609 Edema, unspecified: Secondary | ICD-10-CM

## 2017-10-11 DIAGNOSIS — D649 Anemia, unspecified: Secondary | ICD-10-CM | POA: Diagnosis not present

## 2017-10-12 ENCOUNTER — Telehealth: Payer: Self-pay | Admitting: Podiatry

## 2017-10-12 NOTE — Telephone Encounter (Signed)
Orthotics are in and pt is going to call Monday to set up appt to pick them up.

## 2017-10-15 ENCOUNTER — Ambulatory Visit (HOSPITAL_COMMUNITY)
Admission: RE | Admit: 2017-10-15 | Discharge: 2017-10-15 | Disposition: A | Discharge status: Home / Self Care | Payer: BLUE CROSS/BLUE SHIELD | Source: Ambulatory Visit | Attending: Pulmonary Disease | Admitting: Pulmonary Disease

## 2017-10-15 DIAGNOSIS — R609 Edema, unspecified: Secondary | ICD-10-CM

## 2017-10-15 DIAGNOSIS — Z87891 Personal history of nicotine dependence: Secondary | ICD-10-CM | POA: Diagnosis not present

## 2017-10-15 DIAGNOSIS — I341 Nonrheumatic mitral (valve) prolapse: Secondary | ICD-10-CM | POA: Insufficient documentation

## 2017-10-15 NOTE — Progress Notes (Signed)
*  PRELIMINARY RESULTS* Echocardiogram 2D Echocardiogram has been performed.  Leavy Cella 10/15/2017, 12:14 PM

## 2017-10-30 DIAGNOSIS — Z1211 Encounter for screening for malignant neoplasm of colon: Secondary | ICD-10-CM | POA: Diagnosis not present

## 2017-11-02 DIAGNOSIS — D509 Iron deficiency anemia, unspecified: Secondary | ICD-10-CM | POA: Diagnosis not present

## 2017-11-08 ENCOUNTER — Ambulatory Visit: Payer: BLUE CROSS/BLUE SHIELD | Admitting: Podiatry

## 2017-11-12 DIAGNOSIS — D509 Iron deficiency anemia, unspecified: Secondary | ICD-10-CM | POA: Diagnosis not present

## 2017-11-13 ENCOUNTER — Encounter: Payer: Self-pay | Admitting: Cardiovascular Disease

## 2017-11-13 ENCOUNTER — Ambulatory Visit (INDEPENDENT_AMBULATORY_CARE_PROVIDER_SITE_OTHER): Payer: BLUE CROSS/BLUE SHIELD | Admitting: Cardiovascular Disease

## 2017-11-13 ENCOUNTER — Telehealth: Payer: Self-pay | Admitting: Cardiovascular Disease

## 2017-11-13 VITALS — BP 102/68 | HR 114 | Ht 60.0 in | Wt 112.0 lb

## 2017-11-13 DIAGNOSIS — R931 Abnormal findings on diagnostic imaging of heart and coronary circulation: Secondary | ICD-10-CM | POA: Diagnosis not present

## 2017-11-13 DIAGNOSIS — E785 Hyperlipidemia, unspecified: Secondary | ICD-10-CM | POA: Diagnosis not present

## 2017-11-13 DIAGNOSIS — M069 Rheumatoid arthritis, unspecified: Secondary | ICD-10-CM | POA: Diagnosis not present

## 2017-11-13 DIAGNOSIS — I429 Cardiomyopathy, unspecified: Secondary | ICD-10-CM

## 2017-11-13 DIAGNOSIS — R000 Tachycardia, unspecified: Secondary | ICD-10-CM | POA: Diagnosis not present

## 2017-11-13 NOTE — Progress Notes (Signed)
CARDIOLOGY CONSULT NOTE  Patient ID: Vanessa Lara MRN: [id number] DOB/AGE: 02/29/1956 62 y.o.  Admit date: (Not on file) Primary Physician: Sinda Du, MD Referring Physician: Dr. Luan Pulling  Reason for Consultation: Abnormal echocardiogram  HPI: Vanessa Lara is a 62 y.o. female who is being seen today for the evaluation of abnormal echocardiogram at the request of Sinda Du, MD.   Past medical history includes rheumatoid arthritis and hyperlipidemia. She has been treated with Golimumab.  I reviewed records from her PCP.  She had apparently had experienced weight gain and complained of swelling in her abdomen.  Her appetite had been poor.  She reportedly has a history of mitral valve prolapse.  Her PCP ordered an echocardiogram.  I personally reviewed the echocardiographic images performed on 10/15/17 which demonstrated normal left ventricular systolic function, LVEF 41-96%, grade indeterminate diastolic dysfunction, with regional wall motion abnormalities which included mild hypokinesis of the apical anterior, apical inferior, apical lateral, and apical myocardium.  There was no significant mitral valve prolapse.  ECG performed in the office today which I ordered and personally interpreted demonstrates normal sinus rhythm with no ischemic ST segment or T-wave abnormalities, nor any arrhythmias.  She said she first received 1 dose of methotrexate and has not felt well since.  She said it kept her up for 7 days.  She says she normally weighs about 100 pounds or slightly under and has gained 13 pounds since November.  She has diminished appetite and poor sleep.  She said she has no energy.  She is receiving IV iron therapy for anemia.  She quit smoking about 5 years ago.  She has occasional chest pains which are sometimes sharp and sometimes dull located in the left inframammary region.  They occur more frequently when she is tired.  She denies orthopnea, leg swelling,  and paroxysmal nocturnal dyspnea.   Family history: 2 sisters underwent surgery for mitral valve prolapse and one sister had ruptured chordae tendonae.  2 maternal uncles and 1 maternal aunt had myocardial infarction.   No Known Allergies  Current Outpatient Medications  Medication Sig Dispense Refill  . acetaminophen (TYLENOL) 650 MG CR tablet Take 1,300 mg by mouth daily as needed for pain.    Marland Kitchen atorvastatin (LIPITOR) 10 MG tablet Take 10 mg by mouth daily.    . Golimumab ([place] ARIA IV) Inject into the vein. Get infusions every 2, 4, and 8 weeks    . meloxicam (MOBIC) 7.5 MG tablet Take 7.5 mg by mouth daily.    Marland Kitchen PARoxetine (PAXIL) 10 MG tablet Take 20 mg by mouth daily.    . traMADol (ULTRAM) 50 MG tablet Take by mouth every 6 (six) hours as needed.    . traZODone (DESYREL) 50 MG tablet Take 25 mg by mouth at bedtime.     Marland Kitchen UNKNOWN TO PATIENT IRON INFUSION     No current facility-administered medications for this visit.     Past Medical History:  Diagnosis Date  . Arthritis   . Depression   . Endometriosis   . High cholesterol   . IBS (irritable bowel syndrome)   . Mitral valve prolapse   . Uterus, adenomyosis     Past Surgical History:  Procedure Laterality Date  . COLONOSCOPY    . COLONOSCOPY N/A 12/13/2015   Procedure: COLONOSCOPY;  Surgeon: Rogene Houston, MD;  Location: [facility];  Service: Endoscopy;  Laterality: N/A;  9:55  . Fatty tumor  2011 (left arm)  . TONSILLECTOMY    . TOTAL ABDOMINAL HYSTERECTOMY     2003    Social History   Socioeconomic History  . Marital status: Married    Spouse name: Not on file  . Number of children: Not on file  . Years of education: Not on file  . Highest education level: Not on file  Social Needs  . Financial resource strain: Not on file  . Food insecurity - worry: Not on file  . Food insecurity - inability: Not on file  . Transportation needs - medical: Not on file  . Transportation needs - non-medical:  Not on file  Occupational History  . Not on file  Tobacco Use  . Smoking status: Former Smoker    Packs/day: 0.50    Years: 33.00    Pack years: 16.50    Types: Cigarettes  . Smokeless tobacco: Never Used  . Tobacco comment: quit 3 yrs ago  Substance and Sexual Activity  . Alcohol use: No    Alcohol/week: 0.0 oz  . Drug use: No  . Sexual activity: Not on file  Other Topics Concern  . Not on file  Social History Narrative  . Not on file     Current Meds  Medication Sig  . acetaminophen (TYLENOL) 650 MG CR tablet Take 1,300 mg by mouth daily as needed for pain.  Marland Kitchen atorvastatin (LIPITOR) 10 MG tablet Take 10 mg by mouth daily.  . Golimumab ([place] ARIA IV) Inject into the vein. Get infusions every 2, 4, and 8 weeks  . meloxicam (MOBIC) 7.5 MG tablet Take 7.5 mg by mouth daily.  Marland Kitchen PARoxetine (PAXIL) 10 MG tablet Take 20 mg by mouth daily.  . traMADol (ULTRAM) 50 MG tablet Take by mouth every 6 (six) hours as needed.  . traZODone (DESYREL) 50 MG tablet Take 25 mg by mouth at bedtime.   Marland Kitchen UNKNOWN TO PATIENT IRON INFUSION  . [DISCONTINUED] traMADol (ULTRAM) 50 MG tablet Take 1 tablet (50 mg total) by mouth 3 (three) times daily. (Patient taking differently: Take 50 mg by mouth every 12 (twelve) hours as needed. )      Review of systems complete and found to be negative unless listed above in HPI    Physical exam Blood pressure 102/68, pulse (!) 114, height 5' (1.524 m), weight 112 lb (50.8 kg), SpO2 98 %. General: NAD Neck: No JVD, no thyromegaly or thyroid nodule.  Lungs: Clear to auscultation bilaterally with normal respiratory effort. CV: Nondisplaced PMI. Regular rate and rhythm, normal S1/S2, no S3/S4, no murmur.  No peripheral edema.  No carotid bruit.    Abdomen: Soft, nontender, no distention.  Skin: Intact without lesions or rashes.  Neurologic: Alert and oriented x 3.  Psych: Normal affect. Extremities: No clubbing or cyanosis.  HEENT: Normal.   ECG: Most  recent ECG reviewed.   Labs: No results found for: K, BUN, CREATININE, ALT, TSH, HGB   Lipids: No results found for: LDLCALC, LDLDIRECT, CHOL, TRIG, HDL      ASSESSMENT AND PLAN:  1.  Cardiomyopathy: While golimumab has been associated with hypertension, there is very low incidence of heart failure.  Given the association between rheumatologic disease and coronary artery disease along with her history of hyperlipidemia and history of tobacco use in the past, I will obtain a Lexiscan Myoview stress test to evaluate for ischemic heart disease.  2.  Hyperlipidemia: Continue Lipitor.  3.  Rheumatoid arthritis: Currently being treated with Golimumab.  Disposition: Follow up in 2 months   Signed: Kate Sable, M.D., F.A.C.C.  11/13/2017, 1:50 PM

## 2017-11-13 NOTE — Telephone Encounter (Signed)
Pre-cert Verification for the following procedure   Exercise stress scheduled for 11-21-17 at [facility]

## 2017-11-13 NOTE — Patient Instructions (Signed)
Medication Instructions:  Your physician recommends that you continue on your current medications as directed. Please refer to the Current Medication list given to you today.  Labwork: NONE  Testing/Procedures: Your physician has requested that you have en exercise stress myoview. For further information please visit [email or web address]. Please follow instruction sheet, as given.  Follow-Up: Your physician recommends that you schedule a follow-up appointment in: 2 [address]  Any Other Special Instructions Will Be Listed Below (If Applicable).  If you need a refill on your cardiac medications before your next appointment, please call your pharmacy.

## 2017-11-15 ENCOUNTER — Telehealth: Payer: Self-pay | Admitting: Cardiovascular Disease

## 2017-11-15 MED ORDER — FUROSEMIDE 20 MG PO TABS: 20.0000 mg | ORAL_TABLET | Freq: Every day | ORAL | 0 refills | Status: DC | PRN

## 2017-11-15 NOTE — Telephone Encounter (Signed)
Patient will call back if she has to take more than one. Rx sent to [facility]

## 2017-11-15 NOTE — Telephone Encounter (Signed)
Mrs. Vanessa Lara called stating that she feels like she is retaining fluid. States that she feels miserable today. Please call [phone]

## 2017-11-15 NOTE — Telephone Encounter (Signed)
Can take Lasix 20 mg prn. If she takes it more than once, please obtain a BMET afterwards.

## 2017-11-15 NOTE — Telephone Encounter (Signed)
Patient states she has noticed and weight gain and swelling in her stomach. Patient states she feels miserable. Patient states she is unable to eat due to the swelling in her stomach. Patient does not take anything for fluid and has not noticed any swelling anywhere else.

## 2017-11-19 DIAGNOSIS — M0579 Rheumatoid arthritis with rheumatoid factor of multiple sites without organ or systems involvement: Secondary | ICD-10-CM | POA: Diagnosis not present

## 2017-11-19 DIAGNOSIS — M79673 Pain in unspecified foot: Secondary | ICD-10-CM | POA: Diagnosis not present

## 2017-11-19 DIAGNOSIS — M79643 Pain in unspecified hand: Secondary | ICD-10-CM | POA: Diagnosis not present

## 2017-11-19 DIAGNOSIS — M2550 Pain in unspecified joint: Secondary | ICD-10-CM | POA: Diagnosis not present

## 2017-11-20 ENCOUNTER — Telehealth: Payer: Self-pay | Admitting: Cardiovascular Disease

## 2017-11-20 DIAGNOSIS — I341 Nonrheumatic mitral (valve) prolapse: Secondary | ICD-10-CM

## 2017-11-20 NOTE — Telephone Encounter (Signed)
Mrs. Fetherolf called stating that she does not feel like the Lasix is working.  Please call [phone].

## 2017-11-20 NOTE — Telephone Encounter (Signed)
Pt says not noticing much difference in swelling in her stomach since taking lasix daily - denies any new or worsening symptoms since previous phone note

## 2017-11-21 ENCOUNTER — Encounter (HOSPITAL_COMMUNITY): Payer: BLUE CROSS/BLUE SHIELD

## 2017-11-21 NOTE — Telephone Encounter (Signed)
Can try 40 mg bid x 2 days. Obtain BMET in 3 days.

## 2017-11-21 NOTE — Telephone Encounter (Signed)
Patient informed and verbalized understanding of plan. 

## 2017-11-26 ENCOUNTER — Other Ambulatory Visit (HOSPITAL_COMMUNITY)
Admission: RE | Admit: 2017-11-26 | Discharge: 2017-11-26 | Disposition: A | Discharge status: Home / Self Care | Payer: BLUE CROSS/BLUE SHIELD | Source: Ambulatory Visit | Attending: Cardiovascular Disease | Admitting: Cardiovascular Disease

## 2017-11-26 DIAGNOSIS — I341 Nonrheumatic mitral (valve) prolapse: Secondary | ICD-10-CM | POA: Diagnosis not present

## 2017-11-26 LAB — BASIC METABOLIC PANEL
Anion gap: 10 (ref 5–15)
BUN: 13 mg/dL (ref 6–20)
CO2: 26 mmol/L (ref 22–32)
Calcium: 9.2 mg/dL (ref 8.9–10.3)
Chloride: 100 mmol/L — ABNORMAL LOW (ref 101–111)
Creatinine, Ser: 0.77 mg/dL (ref 0.44–1.00)
GFR calc Af Amer: >60 mL/min (ref >60)
GFR calc non Af Amer: >60 mL/min (ref >60)
Glucose, Bld: 135 mg/dL — ABNORMAL HIGH (ref 65–99)
Potassium: 3 mmol/L — ABNORMAL LOW (ref 3.5–5.1)
Sodium: 136 mmol/L (ref 135–145)

## 2017-11-27 ENCOUNTER — Telehealth: Payer: Self-pay

## 2017-11-27 MED ORDER — POTASSIUM CHLORIDE CRYS ER 20 MEQ PO TBCR: EXTENDED_RELEASE_TABLET | ORAL | 3 refills | Status: DC

## 2017-11-27 NOTE — Telephone Encounter (Signed)
-----   Message from Herminio Commons, MD sent at 11/26/2017  2:50 PM EST ----- K is low. Give 40 meq daily x 3 days. Have her take 20 meq KCl whenever she takes Lasix. Repeat BMET in one week.

## 2017-11-27 NOTE — Telephone Encounter (Addendum)
-----   Message from Herminio Commons, MD sent at 11/26/2017  2:50 PM EST ----- K is low. Give 40 meq daily x 3 days. Have her take 20 meq KCl whenever she takes Lasix. Repeat BMET in one week.    p tnotified and will start potassium

## 2017-11-27 NOTE — Telephone Encounter (Signed)
LM for patient to call back-cc 

## 2017-11-29 ENCOUNTER — Encounter (HOSPITAL_COMMUNITY): Payer: BLUE CROSS/BLUE SHIELD

## 2017-11-30 ENCOUNTER — Telehealth: Payer: Self-pay | Admitting: Physician Assistant

## 2017-11-30 ENCOUNTER — Other Ambulatory Visit: Payer: Self-pay | Admitting: Cardiovascular Disease

## 2017-11-30 MED ORDER — FUROSEMIDE 40 MG PO TABS: 40.0000 mg | ORAL_TABLET | Freq: Every day | ORAL | 1 refills | Status: DC

## 2017-11-30 NOTE — Telephone Encounter (Signed)
The patient called the answering service after-hours today concerned about Lasix rx. She recently ran out early due to OP med titration. She requested a refill earlier today but it was sent in as the original prescription dose and therefore insurance would not honor this. What she tells me she's been taking differs slightly from the recent notes. She says she had been on 20mg  daily for quite some time. On 2/19 there is a phone note to increase to 40mg  BID. She misunderstood this and changed to 40mg  daily for several days. She then states she spoke with someone again and was told to take 80mg  for several days then have bloodwork, which she came in for on 2/25 prompting potassium repletion. She has since gone back to 40mg  daily and is feeling good - symptoms have resolved and she feels good on this dose. We discussed continuing Lasix 40mg  daily for now. I suspect insurance wouldn't pay for dose because the sig was unchanged. I've sent in updated rx for Lasix 40mg  daily (new dose size/patient aware), and also added that she may take an extra 1/2 tablet daily as needed for increased swelling, weight gain or shortness of breath to help cover for times when she does need extra Lasix. I also asked her to increase dietary intake of healthy sources of potassium including bananas, squash, yogurt, white beans, sweet potatoes, leafy greens, and avocados. She will also continue KCl 41meq daily.  I will forward to Dr. Bronson Ing and [facility] team to review and weigh in if any further changes are made. The patient was supposed to have a f/u BMET 1 week per 2/25 lab note so she should be called with any further instructions.  The patient verbalized understanding and gratitude.  Dayna Dunn PA-C

## 2017-12-03 NOTE — Telephone Encounter (Signed)
I agree with Dayna's recommendations.

## 2017-12-07 ENCOUNTER — Encounter (HOSPITAL_BASED_OUTPATIENT_CLINIC_OR_DEPARTMENT_OTHER)
Admission: RE | Admit: 2017-12-07 | Discharge: 2017-12-07 | Disposition: A | Discharge status: Home / Self Care | Payer: BLUE CROSS/BLUE SHIELD | Source: Ambulatory Visit | Attending: Cardiovascular Disease | Admitting: Cardiovascular Disease

## 2017-12-07 ENCOUNTER — Ambulatory Visit (HOSPITAL_COMMUNITY)
Admission: RE | Admit: 2017-12-07 | Discharge: 2017-12-07 | Disposition: A | Discharge status: Home / Self Care | Payer: BLUE CROSS/BLUE SHIELD | Source: Ambulatory Visit | Attending: Cardiovascular Disease | Admitting: Cardiovascular Disease

## 2017-12-07 ENCOUNTER — Encounter (HOSPITAL_COMMUNITY): Payer: Self-pay

## 2017-12-07 DIAGNOSIS — R931 Abnormal findings on diagnostic imaging of heart and coronary circulation: Secondary | ICD-10-CM | POA: Insufficient documentation

## 2017-12-07 LAB — NM MYOCAR MULTI W/SPECT W/WALL MOTION / EF
LV dias vol: 68 mL (ref 46–106)
LV sys vol: 26 mL
Peak HR: 112 {beats}/min
RATE: 0.63
Rest HR: 76 {beats}/min
SDS: 7
SRS: 3
SSS: 10
TID: 1.17

## 2017-12-07 MED ORDER — SODIUM CHLORIDE 0.9% FLUSH
INTRAVENOUS | Status: AC
  Administered 2017-12-07: 10 mL via INTRAVENOUS
  Filled 2017-12-07: qty 10

## 2017-12-07 MED ORDER — TECHNETIUM TC 99M TETROFOSMIN IV KIT
10.0000 | PACK | Freq: Once | INTRAVENOUS | Status: AC | PRN
  Administered 2017-12-07: 10.9 via INTRAVENOUS

## 2017-12-07 MED ORDER — REGADENOSON 0.4 MG/5ML IV SOLN
INTRAVENOUS | Status: AC
  Administered 2017-12-07: 0.4 mg via INTRAVENOUS
  Filled 2017-12-07: qty 5

## 2017-12-07 MED ORDER — TECHNETIUM TC 99M TETROFOSMIN IV KIT
30.0000 | PACK | Freq: Once | INTRAVENOUS | Status: AC | PRN
  Administered 2017-12-07: 30 via INTRAVENOUS

## 2017-12-10 DIAGNOSIS — F321 Major depressive disorder, single episode, moderate: Secondary | ICD-10-CM | POA: Diagnosis not present

## 2017-12-10 DIAGNOSIS — I5032 Chronic diastolic (congestive) heart failure: Secondary | ICD-10-CM | POA: Diagnosis not present

## 2017-12-10 DIAGNOSIS — M069 Rheumatoid arthritis, unspecified: Secondary | ICD-10-CM | POA: Diagnosis not present

## 2017-12-10 DIAGNOSIS — G4700 Insomnia, unspecified: Secondary | ICD-10-CM | POA: Diagnosis not present

## 2017-12-11 DIAGNOSIS — D509 Iron deficiency anemia, unspecified: Secondary | ICD-10-CM | POA: Diagnosis not present

## 2017-12-11 DIAGNOSIS — G4700 Insomnia, unspecified: Secondary | ICD-10-CM | POA: Diagnosis not present

## 2017-12-11 DIAGNOSIS — D649 Anemia, unspecified: Secondary | ICD-10-CM | POA: Diagnosis not present

## 2017-12-11 DIAGNOSIS — M069 Rheumatoid arthritis, unspecified: Secondary | ICD-10-CM | POA: Diagnosis not present

## 2017-12-11 DIAGNOSIS — I5032 Chronic diastolic (congestive) heart failure: Secondary | ICD-10-CM | POA: Diagnosis not present

## 2017-12-12 DIAGNOSIS — M2550 Pain in unspecified joint: Secondary | ICD-10-CM | POA: Diagnosis not present

## 2017-12-12 DIAGNOSIS — K589 Irritable bowel syndrome without diarrhea: Secondary | ICD-10-CM | POA: Diagnosis not present

## 2017-12-12 DIAGNOSIS — M79643 Pain in unspecified hand: Secondary | ICD-10-CM | POA: Diagnosis not present

## 2017-12-12 DIAGNOSIS — M0579 Rheumatoid arthritis with rheumatoid factor of multiple sites without organ or systems involvement: Secondary | ICD-10-CM | POA: Diagnosis not present

## 2017-12-13 ENCOUNTER — Encounter: Payer: Self-pay | Admitting: *Deleted

## 2017-12-20 ENCOUNTER — Encounter: Payer: Self-pay | Admitting: Cardiovascular Disease

## 2018-01-10 DIAGNOSIS — K589 Irritable bowel syndrome without diarrhea: Secondary | ICD-10-CM | POA: Diagnosis not present

## 2018-01-10 DIAGNOSIS — M0579 Rheumatoid arthritis with rheumatoid factor of multiple sites without organ or systems involvement: Secondary | ICD-10-CM | POA: Diagnosis not present

## 2018-01-10 DIAGNOSIS — D509 Iron deficiency anemia, unspecified: Secondary | ICD-10-CM | POA: Diagnosis not present

## 2018-01-10 DIAGNOSIS — Z79899 Other long term (current) drug therapy: Secondary | ICD-10-CM | POA: Diagnosis not present

## 2018-01-14 ENCOUNTER — Encounter: Payer: Self-pay | Admitting: Cardiovascular Disease

## 2018-01-14 ENCOUNTER — Ambulatory Visit (INDEPENDENT_AMBULATORY_CARE_PROVIDER_SITE_OTHER): Payer: BLUE CROSS/BLUE SHIELD | Admitting: Cardiovascular Disease

## 2018-01-14 VITALS — BP 120/80 | HR 106 | Ht 60.0 in | Wt 110.0 lb

## 2018-01-14 DIAGNOSIS — R931 Abnormal findings on diagnostic imaging of heart and coronary circulation: Secondary | ICD-10-CM | POA: Diagnosis not present

## 2018-01-14 DIAGNOSIS — M069 Rheumatoid arthritis, unspecified: Secondary | ICD-10-CM

## 2018-01-14 DIAGNOSIS — I429 Cardiomyopathy, unspecified: Secondary | ICD-10-CM | POA: Diagnosis not present

## 2018-01-14 DIAGNOSIS — R140 Abdominal distension (gaseous): Secondary | ICD-10-CM | POA: Diagnosis not present

## 2018-01-14 DIAGNOSIS — E785 Hyperlipidemia, unspecified: Secondary | ICD-10-CM | POA: Diagnosis not present

## 2018-01-14 NOTE — Patient Instructions (Addendum)
Your physician wants you to follow-up in:2 months  with Dr.Koneswaran     Continue to take Lasix 40 mg and potassium 20 meq daily DO NOT take it twice a day    Call us back in a few days and let us know how you are doing       No lab work or tests ordered today      Thank you for choosing [facility] !

## 2018-01-14 NOTE — Progress Notes (Signed)
SUBJECTIVE: The patient presents for routine follow-up.  She underwent a normal stress test on 12/07/17.  Echocardiogram performed on 10/15/17 demonstrated normal left ventricular systolic function, LVEF 34-28%, grade indeterminate diastolic dysfunction, with regional wall motion abnormalities which included mild hypokinesis of the apical anterior, apical inferior, apical lateral, and apical myocardium.  There was no significant mitral valve prolapse.  She said she normally weighs between 95-100 pounds.  Her weight has gone up to 110 pounds.  She has occasional left inframammary chest pressure.  She denies orthopnea, leg swelling, and paroxysmal nocturnal dyspnea.  She has some bilateral pain at the base of her toes of both feet which she attributes to rheumatoid arthritis.  Her rheumatologist is beginning an alternative medication and she has several questions about it.   Family history: 2 sisters underwent surgery for mitral valve prolapse and one sister had ruptured chordae tendonae.  2 maternal uncles and 1 maternal aunt had myocardial infarction.   Review of Systems: As per "subjective", otherwise negative.  No Known Allergies  Current Outpatient Medications  Medication Sig Dispense Refill  . acetaminophen (TYLENOL) 650 MG CR tablet Take 1,300 mg by mouth daily as needed for pain.    Marland Kitchen atorvastatin (LIPITOR) 10 MG tablet Take 10 mg by mouth daily.    . furosemide (LASIX) 40 MG tablet Take 1 tablet (40 mg total) by mouth daily. May take an extra 1/2 tablet daily as needed for weight gain, swelling, or shortness of breath. 45 tablet 1  . meloxicam (MOBIC) 7.5 MG tablet Take 7.5 mg by mouth daily.    Marland Kitchen PARoxetine (PAXIL) 10 MG tablet Take 20 mg by mouth daily.    . potassium chloride SA (KLOR-CON M20) 20 MEQ tablet Take 40 meq ( 2 tablets) for 3 days , THEN take potassium 20 meq whenever you take lasix 90 tablet 3  . traZODone (DESYREL) 50 MG tablet Take 25 mg by mouth at bedtime.       No current facility-administered medications for this visit.     Past Medical History:  Diagnosis Date  . Arthritis   . Depression   . Endometriosis   . High cholesterol   . IBS (irritable bowel syndrome)   . Mitral valve prolapse   . Uterus, adenomyosis     Past Surgical History:  Procedure Laterality Date  . COLONOSCOPY    . COLONOSCOPY N/A 12/13/2015   Procedure: COLONOSCOPY;  Surgeon: Rogene Houston, MD;  Location: [facility];  Service: Endoscopy;  Laterality: N/A;  9:55  . Fatty tumor     2011 (left arm)  . TONSILLECTOMY    . TOTAL ABDOMINAL HYSTERECTOMY     2003    Social History   Socioeconomic History  . Marital status: Married    Spouse name: Not on file  . Number of children: Not on file  . Years of education: Not on file  . Highest education level: Not on file  Occupational History  . Not on file  Social Needs  . Financial resource strain: Not on file  . Food insecurity:    Worry: Not on file    Inability: Not on file  . Transportation needs:    Medical: Not on file    Non-medical: Not on file  Tobacco Use  . Smoking status: Former Smoker    Packs/day: 0.50    Years: 33.00    Pack years: 16.50    Types: Cigarettes  . Smokeless tobacco: Never  Used  . Tobacco comment: quit 3 yrs ago  Substance and Sexual Activity  . Alcohol use: No    Alcohol/week: 0.0 oz  . Drug use: No  . Sexual activity: Not on file  Lifestyle  . Physical activity:    Days per week: Not on file    Minutes per session: Not on file  . Stress: Not on file  Relationships  . Social connections:    Talks on phone: Not on file    Gets together: Not on file    Attends religious service: Not on file    Active member of club or organization: Not on file    Attends meetings of clubs or organizations: Not on file    Relationship status: Not on file  . Intimate partner violence:    Fear of current or ex partner: Not on file    Emotionally abused: Not on file     Physically abused: Not on file    Forced sexual activity: Not on file  Other Topics Concern  . Not on file  Social History Narrative  . Not on file     Vitals:   01/14/18 1329  BP: 120/80  Weight: 110 lb (49.9 kg)  Height: 5' (1.524 m)    Wt Readings from Last 3 Encounters:  01/14/18 110 lb (49.9 kg)  11/13/17 112 lb (50.8 kg)  04/30/17 105 lb (47.6 kg)     PHYSICAL EXAM General: NAD HEENT: Normal. Neck: No JVD, no thyromegaly. Lungs: Clear to auscultation bilaterally with normal respiratory effort. CV: Regular rate and rhythm, normal S1/S2, no S3/S4, no murmur. No pretibial or periankle edema.     Abdomen: Soft, nontender, no distention.  Neurologic: Alert and oriented.  Psych: Normal affect. Skin: Normal. Musculoskeletal: No gross deformities.    ECG: Most recent ECG reviewed.   Labs: Lab Results  Component Value Date/Time   K 3.0 (L) 11/26/2017 01:12 PM   BUN 13 11/26/2017 01:12 PM   CREATININE 0.77 11/26/2017 01:12 PM     Lipids: No results found for: LDLCALC, LDLDIRECT, CHOL, TRIG, HDL     ASSESSMENT AND PLAN:  1.  Cardiomyopathy: No evidence of coronary artery disease by nuclear stress test as detailed above.  I do not find any indication for beta-blockers or ACE inhibitors at this time.  2.  Hyperlipidemia: Continue Lipitor.  3.  Rheumatoid arthritis: She was being treated with Golimumab but did not tolerate it.  She is soon to begin an alternative therapy.  4.  Abdominal distention: Continue Lasix 40 mg daily along with supplemental potassium.   Disposition: Follow up 2 months   Kate Sable, M.D., F.A.C.C.

## 2018-01-18 ENCOUNTER — Ambulatory Visit: Payer: BLUE CROSS/BLUE SHIELD | Admitting: Cardiovascular Disease

## 2018-02-08 ENCOUNTER — Other Ambulatory Visit: Payer: Self-pay | Admitting: Physician Assistant

## 2018-02-26 DIAGNOSIS — M0579 Rheumatoid arthritis with rheumatoid factor of multiple sites without organ or systems involvement: Secondary | ICD-10-CM | POA: Diagnosis not present

## 2018-03-13 DIAGNOSIS — M0579 Rheumatoid arthritis with rheumatoid factor of multiple sites without organ or systems involvement: Secondary | ICD-10-CM | POA: Diagnosis not present

## 2018-03-13 DIAGNOSIS — M79643 Pain in unspecified hand: Secondary | ICD-10-CM | POA: Diagnosis not present

## 2018-03-15 DIAGNOSIS — G44219 Episodic tension-type headache, not intractable: Secondary | ICD-10-CM | POA: Diagnosis not present

## 2018-03-15 DIAGNOSIS — M546 Pain in thoracic spine: Secondary | ICD-10-CM | POA: Diagnosis not present

## 2018-03-15 DIAGNOSIS — M9901 Segmental and somatic dysfunction of cervical region: Secondary | ICD-10-CM | POA: Diagnosis not present

## 2018-03-15 DIAGNOSIS — M9902 Segmental and somatic dysfunction of thoracic region: Secondary | ICD-10-CM | POA: Diagnosis not present

## 2018-03-25 DIAGNOSIS — R5383 Other fatigue: Secondary | ICD-10-CM | POA: Diagnosis not present

## 2018-03-25 DIAGNOSIS — Z79899 Other long term (current) drug therapy: Secondary | ICD-10-CM | POA: Diagnosis not present

## 2018-03-25 DIAGNOSIS — K589 Irritable bowel syndrome without diarrhea: Secondary | ICD-10-CM | POA: Diagnosis not present

## 2018-03-25 DIAGNOSIS — M0579 Rheumatoid arthritis with rheumatoid factor of multiple sites without organ or systems involvement: Secondary | ICD-10-CM | POA: Diagnosis not present

## 2018-03-27 DIAGNOSIS — M0579 Rheumatoid arthritis with rheumatoid factor of multiple sites without organ or systems involvement: Secondary | ICD-10-CM | POA: Diagnosis not present

## 2018-03-28 ENCOUNTER — Ambulatory Visit (INDEPENDENT_AMBULATORY_CARE_PROVIDER_SITE_OTHER): Payer: BLUE CROSS/BLUE SHIELD | Admitting: Cardiovascular Disease

## 2018-03-28 ENCOUNTER — Encounter: Payer: Self-pay | Admitting: Cardiovascular Disease

## 2018-03-28 VITALS — BP 108/62 | HR 84 | Ht 60.0 in | Wt 107.0 lb

## 2018-03-28 DIAGNOSIS — R931 Abnormal findings on diagnostic imaging of heart and coronary circulation: Secondary | ICD-10-CM | POA: Diagnosis not present

## 2018-03-28 DIAGNOSIS — E785 Hyperlipidemia, unspecified: Secondary | ICD-10-CM

## 2018-03-28 DIAGNOSIS — M069 Rheumatoid arthritis, unspecified: Secondary | ICD-10-CM | POA: Diagnosis not present

## 2018-03-28 DIAGNOSIS — I429 Cardiomyopathy, unspecified: Secondary | ICD-10-CM

## 2018-03-28 MED ORDER — FUROSEMIDE 40 MG PO TABS: 40.0000 mg | ORAL_TABLET | ORAL | 6 refills | Status: DC | PRN

## 2018-03-28 MED ORDER — POTASSIUM CHLORIDE CRYS ER 20 MEQ PO TBCR: EXTENDED_RELEASE_TABLET | ORAL | 3 refills | Status: DC

## 2018-03-28 NOTE — Progress Notes (Signed)
SUBJECTIVE: The patient presents for follow-up of cardiomyopathy and to follow-up on stress test results.  She underwent a normal nuclear stress test on 12/07/2017, LVEF 55 to 65%.  In summary, she has a history of rheumatoid arthritis and hyperlipidemia.  Echocardiogram performed on 10/15/2017 demonstrated normal left ventricular systolic function, LVEF 17-00%, grade indeterminate diastolic dysfunction, with regional wall motion abnormalities which included mild hypokinesis of the apical anterior, apical inferior, apical lateral, and apical myocardium.  There was no significant mitral valve prolapse.  She called our office in March and spoke with one of our PAs.  She has been on Lasix 20 mg daily for quite some time.  She is now on 40 mg daily with supplemental potassium.  She had developed some chest wall swelling primarily in her breasts and shortness of breath with a cough.  She only had mild ankle and feet swelling.  She is feeling well and denies chest pain, palpitations, shortness of breath, leg swelling, orthopnea, and paroxysmal nocturnal dyspnea.  I reviewed labs which she brought dated 03/13/2018: White blood cell 16.3, hemoglobin 12.8, platelets 260, sodium 140, potassium 3.3, BUN 8, creatinine 0.9, normal AST and ALT.  She asked about coming off of Lasix and potassium and taking as needed.   Family history: 2 sisters underwent surgery for mitral valve prolapse and one sister had ruptured chordae tendonae.  2 maternal uncles and 1 maternal aunt had myocardial infarction.  Review of Systems: As per "subjective", otherwise negative.  Allergies  Allergen Reactions  . Methotrexate Derivatives Other (See Comments)    agitation    Current Outpatient Medications  Medication Sig Dispense Refill  . abatacept (ORENCIA) 250 MG injection Inject 500 mg into the vein every 30 (thirty) days.    Marland Kitchen acetaminophen (TYLENOL) 650 MG CR tablet Take 1,300 mg by mouth daily as needed for  pain.    Marland Kitchen atorvastatin (LIPITOR) 10 MG tablet Take 10 mg by mouth daily.    . furosemide (LASIX) 40 MG tablet TAKE 1 TABLET BY MOUTH DAILY. MAY TAKE EXTRA 1/2 TABLET DAILY AS NEEDED FOR WEIGHT GAIN, SWELLIGN OR SHORTNESS OF BREATH 45 tablet 6  . meloxicam (MOBIC) 7.5 MG tablet Take 7.5 mg by mouth daily.    Marland Kitchen PARoxetine (PAXIL) 10 MG tablet Take 20 mg by mouth daily.    . potassium chloride SA (KLOR-CON M20) 20 MEQ tablet Take 40 meq ( 2 tablets) for 3 days , THEN take potassium 20 meq whenever you take lasix 90 tablet 3  . traZODone (DESYREL) 50 MG tablet Take 25 mg by mouth at bedtime.      No current facility-administered medications for this visit.     Past Medical History:  Diagnosis Date  . Arthritis   . Depression   . Endometriosis   . High cholesterol   . IBS (irritable bowel syndrome)   . Mitral valve prolapse   . Uterus, adenomyosis     Past Surgical History:  Procedure Laterality Date  . COLONOSCOPY    . COLONOSCOPY N/A 12/13/2015   Procedure: COLONOSCOPY;  Surgeon: Rogene Houston, MD;  Location: [facility];  Service: Endoscopy;  Laterality: N/A;  9:55  . Fatty tumor     2011 (left arm)  . TONSILLECTOMY    . TOTAL ABDOMINAL HYSTERECTOMY     2003    Social History   Socioeconomic History  . Marital status: Married    Spouse name: Not on file  . Number of  children: Not on file  . Years of education: Not on file  . Highest education level: Not on file  Occupational History  . Not on file  Social Needs  . Financial resource strain: Not on file  . Food insecurity:    Worry: Not on file    Inability: Not on file  . Transportation needs:    Medical: Not on file    Non-medical: Not on file  Tobacco Use  . Smoking status: Former Smoker    Packs/day: 0.50    Years: 33.00    Pack years: 16.50    Types: Cigarettes  . Smokeless tobacco: Never Used  . Tobacco comment: quit 3 yrs ago  Substance and Sexual Activity  . Alcohol use: No    Alcohol/week: 0.0  oz  . Drug use: No  . Sexual activity: Not on file  Lifestyle  . Physical activity:    Days per week: Not on file    Minutes per session: Not on file  . Stress: Not on file  Relationships  . Social connections:    Talks on phone: Not on file    Gets together: Not on file    Attends religious service: Not on file    Active member of club or organization: Not on file    Attends meetings of clubs or organizations: Not on file    Relationship status: Not on file  . Intimate partner violence:    Fear of current or ex partner: Not on file    Emotionally abused: Not on file    Physically abused: Not on file    Forced sexual activity: Not on file  Other Topics Concern  . Not on file  Social History Narrative  . Not on file     Vitals:   03/28/18 1303  BP: 108/62  Pulse: 84  SpO2: 97%  Weight: 107 lb (48.5 kg)  Height: 5' (1.524 m)    Wt Readings from Last 3 Encounters:  03/28/18 107 lb (48.5 kg)  01/14/18 110 lb (49.9 kg)  11/13/17 112 lb (50.8 kg)     PHYSICAL EXAM General: NAD HEENT: Normal. Neck: No JVD, no thyromegaly. Lungs: Clear to auscultation bilaterally with normal respiratory effort. CV: Regular rate and rhythm, normal S1/S2, no S3/S4, no murmur. No pretibial or periankle edema.  No carotid bruit.   Abdomen: Soft, nontender, no distention.  Neurologic: Alert and oriented.  Psych: Normal affect. Skin: Normal. Musculoskeletal: No gross deformities.    ECG: Reviewed above under Subjective   Labs: Lab Results  Component Value Date/Time   K 3.0 (L) 11/26/2017 01:12 PM   BUN 13 11/26/2017 01:12 PM   CREATININE 0.77 11/26/2017 01:12 PM     Lipids: No results found for: LDLCALC, LDLDIRECT, CHOL, TRIG, HDL     ASSESSMENT AND PLAN: 1.  Cardiomyopathy: No evidence of ischemic heart disease by nuclear stress testing as detailed above.  LVEF was normal by nuclear stress testing.  She is currently on Lasix 40 mg daily with supplemental potassium.  I will  have her now take it as needed.  I informed her to take it for 2 or 3 days should she develop shortness of breath with weight gain and/or leg swelling.  I would consider obtaining a limited follow-up echocardiogram in the future to assess left ventricular function and regional wall motion.  2.  Hyperlipidemia: Continue Lipitor.  3.  Rheumatoid arthritis: Currently being treated with golimumab.    Disposition: Follow up 4 months  Kate Sable, M.D., F.A.C.C.

## 2018-03-28 NOTE — Patient Instructions (Signed)
Your physician recommends that you schedule a follow-up appointment in: 4 months with Dr.Koneswaran     Change Lasix and potassium to as needed basis     If you need a refill on your cardiac medications before your next appointment, please call your pharmacy.    No lab work or tests ordered today.      Thank you for choosing [facility] !

## 2018-04-24 DIAGNOSIS — M0579 Rheumatoid arthritis with rheumatoid factor of multiple sites without organ or systems involvement: Secondary | ICD-10-CM | POA: Diagnosis not present

## 2018-05-21 DIAGNOSIS — M9902 Segmental and somatic dysfunction of thoracic region: Secondary | ICD-10-CM | POA: Diagnosis not present

## 2018-05-21 DIAGNOSIS — M545 Low back pain: Secondary | ICD-10-CM | POA: Diagnosis not present

## 2018-05-21 DIAGNOSIS — M9905 Segmental and somatic dysfunction of pelvic region: Secondary | ICD-10-CM | POA: Diagnosis not present

## 2018-05-27 DIAGNOSIS — D509 Iron deficiency anemia, unspecified: Secondary | ICD-10-CM | POA: Diagnosis not present

## 2018-05-27 DIAGNOSIS — Z79899 Other long term (current) drug therapy: Secondary | ICD-10-CM | POA: Diagnosis not present

## 2018-05-27 DIAGNOSIS — M0579 Rheumatoid arthritis with rheumatoid factor of multiple sites without organ or systems involvement: Secondary | ICD-10-CM | POA: Diagnosis not present

## 2018-06-06 DIAGNOSIS — R109 Unspecified abdominal pain: Secondary | ICD-10-CM | POA: Diagnosis not present

## 2018-06-06 DIAGNOSIS — I5032 Chronic diastolic (congestive) heart failure: Secondary | ICD-10-CM | POA: Diagnosis not present

## 2018-06-06 DIAGNOSIS — G4700 Insomnia, unspecified: Secondary | ICD-10-CM | POA: Diagnosis not present

## 2018-06-06 DIAGNOSIS — M069 Rheumatoid arthritis, unspecified: Secondary | ICD-10-CM | POA: Diagnosis not present

## 2018-06-07 DIAGNOSIS — R42 Dizziness and giddiness: Secondary | ICD-10-CM | POA: Diagnosis not present

## 2018-06-07 DIAGNOSIS — F17200 Nicotine dependence, unspecified, uncomplicated: Secondary | ICD-10-CM | POA: Diagnosis not present

## 2018-06-07 DIAGNOSIS — M069 Rheumatoid arthritis, unspecified: Secondary | ICD-10-CM | POA: Diagnosis not present

## 2018-06-07 DIAGNOSIS — I5032 Chronic diastolic (congestive) heart failure: Secondary | ICD-10-CM | POA: Diagnosis not present

## 2018-06-10 ENCOUNTER — Other Ambulatory Visit (HOSPITAL_COMMUNITY): Payer: Self-pay | Admitting: Pulmonary Disease

## 2018-06-10 DIAGNOSIS — R1084 Generalized abdominal pain: Secondary | ICD-10-CM

## 2018-06-10 DIAGNOSIS — R197 Diarrhea, unspecified: Secondary | ICD-10-CM

## 2018-06-12 ENCOUNTER — Ambulatory Visit (INDEPENDENT_AMBULATORY_CARE_PROVIDER_SITE_OTHER): Payer: BLUE CROSS/BLUE SHIELD

## 2018-06-12 ENCOUNTER — Ambulatory Visit (INDEPENDENT_AMBULATORY_CARE_PROVIDER_SITE_OTHER): Payer: BLUE CROSS/BLUE SHIELD | Admitting: Orthopaedic Surgery

## 2018-06-12 ENCOUNTER — Encounter (INDEPENDENT_AMBULATORY_CARE_PROVIDER_SITE_OTHER): Payer: Self-pay | Admitting: Orthopaedic Surgery

## 2018-06-12 DIAGNOSIS — M25551 Pain in right hip: Secondary | ICD-10-CM | POA: Diagnosis not present

## 2018-06-12 DIAGNOSIS — M25552 Pain in left hip: Secondary | ICD-10-CM | POA: Diagnosis not present

## 2018-06-12 DIAGNOSIS — G8929 Other chronic pain: Secondary | ICD-10-CM | POA: Diagnosis not present

## 2018-06-12 DIAGNOSIS — M545 Low back pain: Secondary | ICD-10-CM | POA: Diagnosis not present

## 2018-06-12 DIAGNOSIS — M069 Rheumatoid arthritis, unspecified: Secondary | ICD-10-CM | POA: Diagnosis not present

## 2018-06-12 DIAGNOSIS — I5032 Chronic diastolic (congestive) heart failure: Secondary | ICD-10-CM | POA: Diagnosis not present

## 2018-06-12 DIAGNOSIS — R109 Unspecified abdominal pain: Secondary | ICD-10-CM | POA: Diagnosis not present

## 2018-06-12 DIAGNOSIS — K580 Irritable bowel syndrome with diarrhea: Secondary | ICD-10-CM | POA: Diagnosis not present

## 2018-06-12 MED ORDER — DICLOFENAC SODIUM 1 % TD GEL: 2.0000 g | Freq: Four times a day (QID) | TRANSDERMAL | 3 refills | Status: DC

## 2018-06-12 NOTE — Progress Notes (Signed)
Office Visit Note   Patient: Vanessa Lara           Date of Birth: 05/05/1956           MRN: [id number] Visit Date: 06/12/2018              Requested by: Sinda Du, MD [address], [place] [id number] PCP: Sinda Du, MD   Assessment & Plan: Visit Diagnoses:  1. Chronic bilateral low back pain, with sciatica presence unspecified   2. Bilateral hip pain     Plan: I gave her reassurance that there is no any type of surgery that she needs.  We will send in some Voltaren gel that she can use on her back.  She will work on core strengthening exercises as well.  We will see her back in 4 weeks see how she is doing overall.  She still having problems with her back we would probably recommend facet joint injections and/or an MRI.  Follow-Up Instructions: Return in about 4 weeks (around 07/10/2018).   Orders:  Orders Placed This Encounter  Procedures  . XR HIPS BILAT W OR W/O PELVIS 2V  . XR Lumbar Spine 2-3 Views   Meds ordered this encounter  Medications  . diclofenac sodium (VOLTAREN) 1 % GEL    Sig: Apply 2 g topically 4 (four) times daily.    Dispense:  100 g    Refill:  3      Procedures: No procedures performed   Clinical Data: No additional findings.   Subjective: Chief Complaint  Patient presents with  . Right Hip - Pain  . Left Hip - Pain  Patient comes in today with chief complaint of bilateral hip pain and low back pain with right-sided sciatica.  She has rheumatoid disease and is been treated for years for this.  She has not had x-rays of her back or hips in a long period of time.  She is also had some left great toe pain.  She said different rheumatologic meds do cause her problems.  Her primary care physician or her rheumatologist put her on a steroid taper about a month ago and she said that helped greatly.  She denies any groin pain.  She denies any weakness in her legs.  She is not a diabetic and she is a very thin  individual.  HPI  Review of Systems She currently denies any headache, chest pain, short of breath, fever, chills, nausea, vomiting.  Objective: Vital Signs: There were no vitals taken for this visit.  Physical Exam She is alert and oriented x3 and in no acute distress Ortho Exam Examination of both hips show the move fluidly with no difficulty at all and really no pain.  Her low back exam shows excellent flexion extension with some pain at the facet joints to the lower back and some of the SI joints but is just mild overall.  She has excellent strength in her bilateral lower extremities and ambulates easily. Specialty Comments:  No specialty comments available.  Imaging: Xr Hips Bilat W Or W/o Pelvis 2v  Result Date: 06/12/2018 An AP pelvis and lateral both hips show normal appearing hip joints bilaterally with no significant arthritic changes  Xr Lumbar Spine 2-3 Views  Result Date: 06/12/2018 2 views of the lumbar spine show no acute findings.  There is slight degenerative changes    PMFS History: Patient Active Problem List   Diagnosis Date Noted  . Constipation 03/14/2016  .  Mitral valve prolapse 11/23/2015  . High cholesterol 11/23/2015   Past Medical History:  Diagnosis Date  . Arthritis   . Depression   . Endometriosis   . High cholesterol   . IBS (irritable bowel syndrome)   . Mitral valve prolapse   . Uterus, adenomyosis     Family History  Problem Relation Age of Onset  . Colon cancer Other     Past Surgical History:  Procedure Laterality Date  . COLONOSCOPY    . COLONOSCOPY N/A 12/13/2015   Procedure: COLONOSCOPY;  Surgeon: Rogene Houston, MD;  [facility];  Service: Endoscopy;  Laterality: N/A;  9:55  . Fatty tumor     2011 (left arm)  . TONSILLECTOMY    . TOTAL ABDOMINAL HYSTERECTOMY     2003   Social History   Occupational History  . Not on file  Tobacco Use  . Smoking status: Former Smoker    Packs/day: 0.50    Years:  33.00    Pack years: 16.50    Types: Cigarettes  . Smokeless tobacco: Never Used  . Tobacco comment: quit 3 yrs ago  Substance and Sexual Activity  . Alcohol use: No    Alcohol/week: 0.0 standard drinks  . Drug use: No  . Sexual activity: Not on file

## 2018-06-13 ENCOUNTER — Other Ambulatory Visit (HOSPITAL_COMMUNITY): Payer: Self-pay | Admitting: Pulmonary Disease

## 2018-06-13 ENCOUNTER — Ambulatory Visit (HOSPITAL_COMMUNITY)
Admission: RE | Admit: 2018-06-13 | Discharge: 2018-06-13 | Disposition: A | Discharge status: Home / Self Care | Payer: BLUE CROSS/BLUE SHIELD | Source: Ambulatory Visit | Attending: Pulmonary Disease | Admitting: Pulmonary Disease

## 2018-06-13 DIAGNOSIS — R0602 Shortness of breath: Secondary | ICD-10-CM

## 2018-06-13 DIAGNOSIS — R05 Cough: Secondary | ICD-10-CM | POA: Diagnosis not present

## 2018-06-13 DIAGNOSIS — I700 Atherosclerosis of aorta: Secondary | ICD-10-CM | POA: Diagnosis not present

## 2018-06-13 DIAGNOSIS — R1084 Generalized abdominal pain: Secondary | ICD-10-CM

## 2018-06-13 DIAGNOSIS — R918 Other nonspecific abnormal finding of lung field: Secondary | ICD-10-CM | POA: Diagnosis not present

## 2018-06-13 DIAGNOSIS — R197 Diarrhea, unspecified: Secondary | ICD-10-CM

## 2018-06-13 LAB — POCT I-STAT CREATININE: Creatinine, Ser: 0.5 mg/dL (ref 0.44–1.00)

## 2018-06-13 MED ORDER — IOPAMIDOL (ISOVUE-300) INJECTION 61%
100.0000 mL | Freq: Once | INTRAVENOUS | Status: AC | PRN
  Administered 2018-06-13: 100 mL via INTRAVENOUS

## 2018-06-17 ENCOUNTER — Other Ambulatory Visit (HOSPITAL_COMMUNITY): Payer: Self-pay | Admitting: Pulmonary Disease

## 2018-06-17 ENCOUNTER — Ambulatory Visit (HOSPITAL_COMMUNITY): Payer: BLUE CROSS/BLUE SHIELD

## 2018-06-17 DIAGNOSIS — E785 Hyperlipidemia, unspecified: Secondary | ICD-10-CM | POA: Diagnosis not present

## 2018-06-17 DIAGNOSIS — R42 Dizziness and giddiness: Secondary | ICD-10-CM | POA: Diagnosis not present

## 2018-06-17 DIAGNOSIS — J449 Chronic obstructive pulmonary disease, unspecified: Secondary | ICD-10-CM | POA: Diagnosis not present

## 2018-06-17 DIAGNOSIS — Z1231 Encounter for screening mammogram for malignant neoplasm of breast: Secondary | ICD-10-CM

## 2018-06-17 DIAGNOSIS — M069 Rheumatoid arthritis, unspecified: Secondary | ICD-10-CM | POA: Diagnosis not present

## 2018-06-17 DIAGNOSIS — I5032 Chronic diastolic (congestive) heart failure: Secondary | ICD-10-CM | POA: Diagnosis not present

## 2018-06-17 DIAGNOSIS — D649 Anemia, unspecified: Secondary | ICD-10-CM | POA: Diagnosis not present

## 2018-06-17 DIAGNOSIS — E569 Vitamin deficiency, unspecified: Secondary | ICD-10-CM | POA: Diagnosis not present

## 2018-06-17 DIAGNOSIS — F17200 Nicotine dependence, unspecified, uncomplicated: Secondary | ICD-10-CM | POA: Diagnosis not present

## 2018-06-20 ENCOUNTER — Ambulatory Visit (HOSPITAL_COMMUNITY): Payer: BLUE CROSS/BLUE SHIELD

## 2018-07-01 ENCOUNTER — Telehealth (INDEPENDENT_AMBULATORY_CARE_PROVIDER_SITE_OTHER): Payer: Self-pay | Admitting: Internal Medicine

## 2018-07-01 NOTE — Telephone Encounter (Signed)
Patient left message stating for you to call her about her abd issues - ph# [phone]

## 2018-07-01 NOTE — Telephone Encounter (Signed)
I have spoken with patient. She took specimen to Dr. Luan Pulling.

## 2018-07-02 DIAGNOSIS — K580 Irritable bowel syndrome with diarrhea: Secondary | ICD-10-CM | POA: Diagnosis not present

## 2018-07-03 DIAGNOSIS — H0012 Chalazion right lower eyelid: Secondary | ICD-10-CM | POA: Diagnosis not present

## 2018-07-03 DIAGNOSIS — H16143 Punctate keratitis, bilateral: Secondary | ICD-10-CM | POA: Diagnosis not present

## 2018-07-03 DIAGNOSIS — H04123 Dry eye syndrome of bilateral lacrimal glands: Secondary | ICD-10-CM | POA: Diagnosis not present

## 2018-07-03 DIAGNOSIS — H26493 Other secondary cataract, bilateral: Secondary | ICD-10-CM | POA: Diagnosis not present

## 2018-07-04 ENCOUNTER — Ambulatory Visit (HOSPITAL_COMMUNITY)
Admission: RE | Admit: 2018-07-04 | Discharge: 2018-07-04 | Disposition: A | Discharge status: Home / Self Care | Payer: BLUE CROSS/BLUE SHIELD | Source: Ambulatory Visit | Attending: Pulmonary Disease | Admitting: Pulmonary Disease

## 2018-07-04 ENCOUNTER — Encounter (HOSPITAL_COMMUNITY): Payer: Self-pay

## 2018-07-04 DIAGNOSIS — Z1231 Encounter for screening mammogram for malignant neoplasm of breast: Secondary | ICD-10-CM | POA: Insufficient documentation

## 2018-07-05 ENCOUNTER — Ambulatory Visit (INDEPENDENT_AMBULATORY_CARE_PROVIDER_SITE_OTHER): Payer: BLUE CROSS/BLUE SHIELD | Admitting: Cardiovascular Disease

## 2018-07-05 ENCOUNTER — Encounter: Payer: Self-pay | Admitting: Cardiovascular Disease

## 2018-07-05 VITALS — BP 132/70 | Ht 60.0 in | Wt 98.8 lb

## 2018-07-05 DIAGNOSIS — E785 Hyperlipidemia, unspecified: Secondary | ICD-10-CM | POA: Diagnosis not present

## 2018-07-05 DIAGNOSIS — R931 Abnormal findings on diagnostic imaging of heart and coronary circulation: Secondary | ICD-10-CM

## 2018-07-05 DIAGNOSIS — I429 Cardiomyopathy, unspecified: Secondary | ICD-10-CM

## 2018-07-05 DIAGNOSIS — M069 Rheumatoid arthritis, unspecified: Secondary | ICD-10-CM

## 2018-07-05 NOTE — Patient Instructions (Signed)
Medication Instructions:  NONE If you need a refill on your cardiac medications before your next appointment, please call your pharmacy.   Lab work: NONE If you have labs (blood work) drawn today and your tests are completely normal, you will receive your results only by: Marland Kitchen MyChart Message (if you have MyChart) OR . A paper copy in the mail If you have any lab test that is abnormal or we need to change your treatment, we will call you to review the results.  Testing/Procedures: NONE  Follow-Up: At [facility], you and your health needs are our priority.  As part of our continuing mission to provide you with exceptional heart care, we have created designated Provider Care Teams.  These Care Teams include your primary Cardiologist (physician) and Advanced Practice Providers (APPs -  Physician Assistants and Nurse Practitioners) who all work together to provide you with the care you need, when you need it. .  Follow up with Dr.Koneswaran 3:40 pm om 10/14/2018 Any Other Special Instructions Will Be Listed Below (If Applicable). NONE

## 2018-07-05 NOTE — Progress Notes (Signed)
SUBJECTIVE: The patient presents for follow-up of cardiomyopathy.  She has a history of mature arthritis and hyperlipidemia.  Echocardiogram performed on 10/15/2017 demonstrated normal left ventricular systolic function, LVEF 67-20%, grade indeterminate diastolic dysfunction, with regional wall motion abnormalities which included mild hypokinesis of the apical anterior, apical inferior, apical lateral, and apical myocardium. There was no significant mitral valve prolapse.  She underwent a normal nuclear stress test on 12/07/2017, LVEF 55 to 65%.  She has lost 9 pounds since her last visit with me in late June 2019 by our scales.  She denies exertional chest pain but has had upper left-sided, left inframammary, and left infra axillary chest wall tightness and "spasms ".  She has several questions about her chest x-ray and abdominal CT.  She said she has had a low-grade fever.  She has had a cough productive of white phlegm.  I reviewed the chest x-ray performed on 06/14/2018 which demonstrated pulmonary hyperinflation.  I also reviewed her abdominal CT which showed abdominal aortic atherosclerosis with no evidence of aneurysm.  She also had a renal cyst.  She denies leg swelling, orthopnea, and paroxysmal nocturnal dyspnea.  She smokes.  She has bilateral feet and toe pain which she thinks is related to arthritis but denies claudication pain.    Family history:2 sisters underwent surgery for mitral valve prolapse and one sister had ruptured chordae tendonae. 2 maternal uncles and 1 maternal aunt had myocardial infarction.  Review of Systems: As per "subjective", otherwise negative.  Allergies  Allergen Reactions  . Methotrexate Derivatives Other (See Comments)    agitation    Current Outpatient Medications  Medication Sig Dispense Refill  . abatacept (ORENCIA) 250 MG injection Inject 500 mg into the vein every 30 (thirty) days.    Marland Kitchen acetaminophen (TYLENOL) 650 MG CR tablet Take  1,300 mg by mouth daily as needed for pain.    Marland Kitchen atorvastatin (LIPITOR) 10 MG tablet Take 10 mg by mouth daily.    . diclofenac sodium (VOLTAREN) 1 % GEL Apply 2 g topically 4 (four) times daily. (Patient taking differently: Apply 2 g topically 4 (four) times daily as needed. ) 100 g 3  . furosemide (LASIX) 40 MG tablet Take 1 tablet (40 mg total) by mouth as needed. 45 tablet 6  . meloxicam (MOBIC) 7.5 MG tablet Take 7.5 mg by mouth daily.    Marland Kitchen PARoxetine (PAXIL) 10 MG tablet Take 20 mg by mouth daily.    . potassium chloride SA (KLOR-CON M20) 20 MEQ tablet Take potassium as needed 90 tablet 3  . traZODone (DESYREL) 50 MG tablet Take 25 mg by mouth at bedtime.      No current facility-administered medications for this visit.     Past Medical History:  Diagnosis Date  . Arthritis   . Depression   . Endometriosis   . High cholesterol   . IBS (irritable bowel syndrome)   . Mitral valve prolapse   . Uterus, adenomyosis     Past Surgical History:  Procedure Laterality Date  . COLONOSCOPY    . COLONOSCOPY N/A 12/13/2015   Procedure: COLONOSCOPY;  Surgeon: Rogene Houston, MD;  Location[facility];  Service: Endoscopy;  Laterality: N/A;  9:55  . Fatty tumor     2011 (left arm)  . TONSILLECTOMY    . TOTAL ABDOMINAL HYSTERECTOMY     2003    Social History   Socioeconomic History  . Marital status: Married    Spouse  name: Not on file  . Number of children: Not on file  . Years of education: Not on file  . Highest education level: Not on file  Occupational History  . Not on file  Social Needs  . Financial resource strain: Not on file  . Food insecurity:    Worry: Not on file    Inability: Not on file  . Transportation needs:    Medical: Not on file    Non-medical: Not on file  Tobacco Use  . Smoking status: Former Smoker    Packs/day: 0.50    Years: 33.00    Pack years: 16.50    Types: Cigarettes  . Smokeless tobacco: Never Used  . Tobacco comment: quit 3 yrs ago    Substance and Sexual Activity  . Alcohol use: No    Alcohol/week: 0.0 standard drinks  . Drug use: No  . Sexual activity: Not on file  Lifestyle  . Physical activity:    Days per week: Not on file    Minutes per session: Not on file  . Stress: Not on file  Relationships  . Social connections:    Talks on phone: Not on file    Gets together: Not on file    Attends religious service: Not on file    Active member of club or organization: Not on file    Attends meetings of clubs or organizations: Not on file    Relationship status: Not on file  . Intimate partner violence:    Fear of current or ex partner: Not on file    Emotionally abused: Not on file    Physically abused: Not on file    Forced sexual activity: Not on file  Other Topics Concern  . Not on file  Social History Narrative  . Not on file     Vitals:   07/05/18 1122  BP: 132/70  Weight: 98 lb 12.8 oz (44.8 kg)  Height: 5' (1.524 m)    Wt Readings from Last 3 Encounters:  07/05/18 98 lb 12.8 oz (44.8 kg)  03/28/18 107 lb (48.5 kg)  01/14/18 110 lb (49.9 kg)     PHYSICAL EXAM General: NAD HEENT: Normal. Neck: No JVD, no thyromegaly. Lungs: Clear to auscultation bilaterally with normal respiratory effort. CV: Regular rate and rhythm, normal S1/S2, no S3/S4, no murmur. No pretibial or periankle edema.  No carotid bruit.   Abdomen: Soft, nontender, no distention.  Neurologic: Alert and oriented.  Psych: Normal affect. Skin: Normal. Musculoskeletal: No gross deformities.    ECG: Reviewed above under Subjective   Labs: Lab Results  Component Value Date/Time   K 3.0 (L) 11/26/2017 01:12 PM   BUN 13 11/26/2017 01:12 PM   CREATININE 0.50 06/13/2018 01:41 PM     Lipids: No results found for: LDLCALC, LDLDIRECT, CHOL, TRIG, HDL     ASSESSMENT AND PLAN:  1.  Cardiomyopathy: Symptomatically stable overall.  No evidence of ischemic heart disease by nuclear stress testing as detailed above.  She  has had chest pains but they appear to be nonexertional and atypical. LVEF was normal by nuclear stress testing.    She has been instructed to take Lasix 40 mg with supplemental potassium as needed.  I informed her to take it for 2 or 3 days should she develop shortness of breath with weight gain and/or leg swelling.  I would consider obtaining a limited follow-up echocardiogram in the future to assess left ventricular function and regional wall motion.  2.  Hyperlipidemia: Continue Lipitor.  3.  Rheumatoid arthritis: She follows with rheumatology.   Disposition: Follow up 3 months   Kate Sable, M.D., F.A.C.C.

## 2018-07-08 ENCOUNTER — Ambulatory Visit: Payer: BLUE CROSS/BLUE SHIELD | Admitting: Podiatry

## 2018-07-08 ENCOUNTER — Telehealth (INDEPENDENT_AMBULATORY_CARE_PROVIDER_SITE_OTHER): Payer: Self-pay | Admitting: Orthopaedic Surgery

## 2018-07-08 ENCOUNTER — Encounter (INDEPENDENT_AMBULATORY_CARE_PROVIDER_SITE_OTHER): Payer: Self-pay | Admitting: Family Medicine

## 2018-07-08 ENCOUNTER — Ambulatory Visit (INDEPENDENT_AMBULATORY_CARE_PROVIDER_SITE_OTHER): Payer: Self-pay

## 2018-07-08 ENCOUNTER — Ambulatory Visit (INDEPENDENT_AMBULATORY_CARE_PROVIDER_SITE_OTHER): Payer: BLUE CROSS/BLUE SHIELD | Admitting: Family Medicine

## 2018-07-08 DIAGNOSIS — M25532 Pain in left wrist: Secondary | ICD-10-CM | POA: Diagnosis not present

## 2018-07-08 MED ORDER — DICLOFENAC SODIUM 1 % TD GEL: 4.0000 g | Freq: Four times a day (QID) | TRANSDERMAL | 6 refills | Status: DC | PRN

## 2018-07-08 NOTE — Telephone Encounter (Signed)
Patient thinks wrist is popped out of socket, transferred to triage. Wanted to see provider today

## 2018-07-08 NOTE — Progress Notes (Signed)
Office Visit Note   Patient: Vanessa Lara           Date of Birth: 1956-09-27           MRN: [id number] Visit Date: 07/08/2018 Requested by: Sinda Du, [address] [place], [place] [id number] PCP: Sinda Du, MD  Subjective: Chief Complaint  Patient presents with  . Left Wrist - Pain    NKI. Pain started yesterday. States she probably did too many chores and over did it. Has coban wrapped around her wrist. Coban makes it feel better.     HPI: She is a 62 year old with left wrist pain.  No injury, pain flared up yesterday.  She has a history of rheumatoid arthritis diagnosed a couple years ago.  She has been through 3 different medications with minimal improvement.  She has been on cholesterol medicine for the past 3 years.  She has a history of mild peripheral vascular disease and coronary artery calcification per her report.  She also struggles with chronic IBS.  This is been going on for probably 20 to 30 years.  She is hurting quite a bit today.              ROS: Otherwise noncontributory  Objective: Vital Signs: There were no vitals taken for this visit.  Physical Exam:  Left wrist: She is tender mainly near the ulnar styloid.  No joint effusion, no warmth or erythema.  Good range of motion of her wrist.  Imaging: Left wrist x-rays: Mild arthritis at the first [facility] joint but overall her joints look pretty healthy.  No cystic changes.  Assessment & Plan: 1.  Left wrist pain, possibly due to osteoarthritis -Voltaren gel as needed.  Trial off Lipitor for a couple weeks.  If her pain improves, she might need to switch to a different cholesterol medication. -Encouraged her to limit intake of sugar and processed carbohydrates.   Follow-Up Instructions: No follow-ups on file.       Procedures: None today.   PMFS History: Patient Active Problem List   Diagnosis Date Noted  . Constipation 03/14/2016  . Mitral valve prolapse 11/23/2015    . High cholesterol 11/23/2015   Past Medical History:  Diagnosis Date  . Arthritis   . Depression   . Endometriosis   . High cholesterol   . IBS (irritable bowel syndrome)   . Mitral valve prolapse   . Uterus, adenomyosis     Family History  Problem Relation Age of Onset  . Colon cancer Other   . Breast cancer Sister     Past Surgical History:  Procedure Laterality Date  . COLONOSCOPY    . COLONOSCOPY N/A 12/13/2015   Procedure: COLONOSCOPY;  Surgeon: Rogene Houston, MD;  [facility];  Service: Endoscopy;  Laterality: N/A;  9:55  . Fatty tumor     2011 (left arm)  . TONSILLECTOMY    . TOTAL ABDOMINAL HYSTERECTOMY     2003   Social History   Occupational History  . Not on file  Tobacco Use  . Smoking status: Former Smoker    Packs/day: 0.50    Years: 33.00    Pack years: 16.50    Types: Cigarettes  . Smokeless tobacco: Never Used  . Tobacco comment: quit 3 yrs ago  Substance and Sexual Activity  . Alcohol use: No    Alcohol/week: 0.0 standard drinks  . Drug use: No  . Sexual activity: Not on file

## 2018-07-10 ENCOUNTER — Ambulatory Visit (INDEPENDENT_AMBULATORY_CARE_PROVIDER_SITE_OTHER): Payer: BLUE CROSS/BLUE SHIELD | Admitting: Orthopaedic Surgery

## 2018-07-11 DIAGNOSIS — M9903 Segmental and somatic dysfunction of lumbar region: Secondary | ICD-10-CM | POA: Diagnosis not present

## 2018-07-11 DIAGNOSIS — M545 Low back pain: Secondary | ICD-10-CM | POA: Diagnosis not present

## 2018-07-11 DIAGNOSIS — M546 Pain in thoracic spine: Secondary | ICD-10-CM | POA: Diagnosis not present

## 2018-07-11 DIAGNOSIS — M9902 Segmental and somatic dysfunction of thoracic region: Secondary | ICD-10-CM | POA: Diagnosis not present

## 2018-07-12 ENCOUNTER — Telehealth: Payer: Self-pay | Admitting: Cardiovascular Disease

## 2018-07-12 NOTE — Telephone Encounter (Signed)
I would recommend continued monitoring of symptoms for now. She has had a lot of recent issues but I am encouraged by that BP. Continue to monitor HR for now. If symptoms get worse over the weekend or next week, please call us back.

## 2018-07-12 NOTE — Telephone Encounter (Signed)
LM to return call.

## 2018-07-12 NOTE — Telephone Encounter (Signed)
Patient informed and verbalized understanding of plan. 

## 2018-07-12 NOTE — Telephone Encounter (Signed)
Patient called stating that Dr. Bronson Ing told her to call the office if she needed to speak with him and that he would call her back.

## 2018-07-12 NOTE — Telephone Encounter (Signed)
Pt says she had an episode of lightheadedness while driving yesterday and had to pull over and sleep for about 45 mins - also c/o pressure on the left side of her chest during that time - went to her pcp and BP was 122/84 didn't know what HR was - says this BP was not normal for her (explained that this was normal with her last few BP in office but pt did not agree) says she felt better today but feels that something isn't right with her - denies any SOB/dizziness today or yesterday - says she was told to contact us if she had any symptoms

## 2018-07-15 ENCOUNTER — Telehealth: Payer: Self-pay | Admitting: *Deleted

## 2018-07-15 NOTE — Telephone Encounter (Signed)
Pt says she went to the GI in [facility] and did not want to discuss with me what was done or recommended - says if Dr Bronson Ing wants to call and discuss he could call her otherwise she would discuss at next f/u

## 2018-07-15 NOTE — Telephone Encounter (Signed)
I will take a look at the chart.

## 2018-07-16 ENCOUNTER — Telehealth: Payer: Self-pay | Admitting: *Deleted

## 2018-07-16 NOTE — Telephone Encounter (Signed)
Pt called stating my need records from previous laparoscopy and hysterectomy.  I let patient know that we would need release signed and we would need to request records from storage.  Vanessa Lara called back and stated that her specialist in [place] sent releases to both hospitals requesting these records. If she needs my assistance in obtaining records she will call our office back. KW CMA

## 2018-07-19 DIAGNOSIS — M545 Low back pain: Secondary | ICD-10-CM | POA: Diagnosis not present

## 2018-07-19 DIAGNOSIS — M546 Pain in thoracic spine: Secondary | ICD-10-CM | POA: Diagnosis not present

## 2018-07-19 DIAGNOSIS — M9903 Segmental and somatic dysfunction of lumbar region: Secondary | ICD-10-CM | POA: Diagnosis not present

## 2018-07-19 DIAGNOSIS — M9902 Segmental and somatic dysfunction of thoracic region: Secondary | ICD-10-CM | POA: Diagnosis not present

## 2018-07-23 ENCOUNTER — Telehealth: Payer: Self-pay | Admitting: Nurse Practitioner

## 2018-07-23 NOTE — Telephone Encounter (Signed)
   Pt with a h/o lightheadedness followed by Court Joy, MD.  Recent neg MV in March w/ nl EF by echo in Jan.  She had an episode of LH that lasted about 3 hrs last week and tonight, she started feeling LH and nauseated.  She waked over to her neighbors house this evening in order to take her BP - 108/87 w/ HR of 111 (she says this is sort of normal for her). I advised that BP is stable and is not likely causing lightheadedness.  She now says she has been feeling this way on and off for a year, ever since starting a new arthritis medication.  I advised that if she continues to feel poorly and needs to be seen, she could present to the ED this evening.  I provided reassurance re: her BP.  Caller verbalized understanding and was grateful for the call back.  Murray Hodgkins, NP 07/23/2018, 7:22 PM

## 2018-07-25 ENCOUNTER — Ambulatory Visit: Payer: BLUE CROSS/BLUE SHIELD

## 2018-07-25 ENCOUNTER — Other Ambulatory Visit: Payer: Self-pay | Admitting: Podiatry

## 2018-07-25 ENCOUNTER — Telehealth: Payer: Self-pay | Admitting: Cardiovascular Disease

## 2018-07-25 ENCOUNTER — Ambulatory Visit (INDEPENDENT_AMBULATORY_CARE_PROVIDER_SITE_OTHER): Payer: BLUE CROSS/BLUE SHIELD | Admitting: Podiatry

## 2018-07-25 ENCOUNTER — Ambulatory Visit (INDEPENDENT_AMBULATORY_CARE_PROVIDER_SITE_OTHER): Payer: BLUE CROSS/BLUE SHIELD

## 2018-07-25 ENCOUNTER — Encounter: Payer: Self-pay | Admitting: Podiatry

## 2018-07-25 DIAGNOSIS — M2041 Other hammer toe(s) (acquired), right foot: Secondary | ICD-10-CM

## 2018-07-25 DIAGNOSIS — M779 Enthesopathy, unspecified: Secondary | ICD-10-CM

## 2018-07-25 DIAGNOSIS — M2042 Other hammer toe(s) (acquired), left foot: Secondary | ICD-10-CM | POA: Diagnosis not present

## 2018-07-25 DIAGNOSIS — M79672 Pain in left foot: Secondary | ICD-10-CM

## 2018-07-25 DIAGNOSIS — M722 Plantar fascial fibromatosis: Secondary | ICD-10-CM

## 2018-07-25 DIAGNOSIS — M79671 Pain in right foot: Secondary | ICD-10-CM

## 2018-07-25 DIAGNOSIS — Z961 Presence of intraocular lens: Secondary | ICD-10-CM | POA: Diagnosis not present

## 2018-07-25 DIAGNOSIS — H16143 Punctate keratitis, bilateral: Secondary | ICD-10-CM | POA: Diagnosis not present

## 2018-07-25 DIAGNOSIS — H04123 Dry eye syndrome of bilateral lacrimal glands: Secondary | ICD-10-CM | POA: Diagnosis not present

## 2018-07-25 DIAGNOSIS — H0012 Chalazion right lower eyelid: Secondary | ICD-10-CM | POA: Diagnosis not present

## 2018-07-25 NOTE — Telephone Encounter (Signed)
Patient was Dr Vanessa Lara to know that she has an appointment scheduled with Dr Elpidio Galea in [place] tomorrow  She started back taking Lipitor last week

## 2018-07-25 NOTE — Telephone Encounter (Signed)
Thank you for letting me know

## 2018-07-26 DIAGNOSIS — M150 Primary generalized (osteo)arthritis: Secondary | ICD-10-CM | POA: Diagnosis not present

## 2018-07-26 DIAGNOSIS — M9902 Segmental and somatic dysfunction of thoracic region: Secondary | ICD-10-CM | POA: Diagnosis not present

## 2018-07-26 DIAGNOSIS — M0579 Rheumatoid arthritis with rheumatoid factor of multiple sites without organ or systems involvement: Secondary | ICD-10-CM | POA: Diagnosis not present

## 2018-07-26 DIAGNOSIS — M9903 Segmental and somatic dysfunction of lumbar region: Secondary | ICD-10-CM | POA: Diagnosis not present

## 2018-07-26 DIAGNOSIS — M545 Low back pain: Secondary | ICD-10-CM | POA: Diagnosis not present

## 2018-07-26 DIAGNOSIS — M546 Pain in thoracic spine: Secondary | ICD-10-CM | POA: Diagnosis not present

## 2018-07-26 NOTE — Progress Notes (Signed)
Subjective:   Patient ID: Vanessa Lara, female   DOB: 62 y.o.   MRN: [id number]   HPI Patient presents stating that her left foot is really bothering her and making it hard to walk and she gets some pain in her right lesser digits.  Patient is due to see a new rheumatologist but is not been in good condition and is very thin and is not healthy at the current time   ROS      Objective:  Physical Exam  Neurovascular status was found to be intact with patient noted to have exquisite discomfort second metatarsal phalangeal joint with elevation of the digit and worsening of the movement of the second toe with fluid buildup around the joint surface.  Patient has mild discomfort in the metatarsal phalangeal joints of both feet and does have well structured first metatarsal right from previous surgery but does get discomfort on the side of both big toes as they are very sensitive     Assessment:  Continuing condition of inflammation capsulitis second metatarsal phalangeal joint left with moderate elevation of the toe fluid buildup and pain in the second and third MPJs along with increased heightened sensitivity     Plan:  H&P x-rays and conditions discussed.  At this time I applied a metatarsal pad left to try to diffuse pressure off the joint and discuss at one point may require shortening osteotomy digital fusion.  Patient will see her new rheumatologist first and will have to make a decision based on response to the padding therapy  X-ray indicates that there is no signs of fracture and there is moderate increase in lifting of the second toe left with good structural correction of the first metatarsal but I did accomplish several years ago

## 2018-07-29 ENCOUNTER — Telehealth: Payer: Self-pay | Admitting: Cardiovascular Disease

## 2018-07-29 NOTE — Telephone Encounter (Signed)
Patient did not hear her phone ringing  Please try calling back

## 2018-07-29 NOTE — Telephone Encounter (Signed)
Patient has had an reaction cause face to go numb and dizziness  A chemotherapy drug

## 2018-07-29 NOTE — Telephone Encounter (Signed)
Attempted to reach, lmtcb-cc   Patient woke up this am with left sided facial numbness and lightheadedness.Is able to move all extremities, states she has no "eye drooping" and wonders if she has Bell's palsy   I told her she needs to examined by an MD and she can always go to the ED

## 2018-07-30 NOTE — Telephone Encounter (Signed)
1640 [id number] to rweach,lmtcb-cc

## 2018-07-30 NOTE — Telephone Encounter (Signed)
I agree, non cardiac symptoms, needs to discuss with pcp ASAP or have ER evaluation  Zandra Abts MD

## 2018-07-31 NOTE — Telephone Encounter (Signed)
Relayed  Dr Harl Bowie message to pt

## 2018-08-06 ENCOUNTER — Encounter: Payer: Self-pay | Admitting: Student

## 2018-08-06 ENCOUNTER — Ambulatory Visit (INDEPENDENT_AMBULATORY_CARE_PROVIDER_SITE_OTHER): Payer: BLUE CROSS/BLUE SHIELD | Admitting: Student

## 2018-08-06 VITALS — BP 114/58 | HR 94 | Ht 60.0 in | Wt 94.0 lb

## 2018-08-06 DIAGNOSIS — R000 Tachycardia, unspecified: Secondary | ICD-10-CM | POA: Diagnosis not present

## 2018-08-06 DIAGNOSIS — R079 Chest pain, unspecified: Secondary | ICD-10-CM | POA: Diagnosis not present

## 2018-08-06 DIAGNOSIS — E785 Hyperlipidemia, unspecified: Secondary | ICD-10-CM

## 2018-08-06 DIAGNOSIS — I429 Cardiomyopathy, unspecified: Secondary | ICD-10-CM

## 2018-08-06 DIAGNOSIS — R002 Palpitations: Secondary | ICD-10-CM | POA: Diagnosis not present

## 2018-08-06 DIAGNOSIS — R202 Paresthesia of skin: Secondary | ICD-10-CM

## 2018-08-06 NOTE — Patient Instructions (Signed)
Medication Instructions:  Your physician recommends that you continue on your current medications as directed. Please refer to the Current Medication list given to you today.  If you need a refill on your cardiac medications before your next appointment, please call your pharmacy.   Lab work: NONE  If you have labs (blood work) drawn today and your tests are completely normal, you will receive your results only by: Marland Kitchen MyChart Message (if you have MyChart) OR . A paper copy in the mail If you have any lab test that is abnormal or we need to change your treatment, we will call you to review the results.  Testing/Procedures: Your physician has recommended that you wear an event monitor. Event monitors are medical devices that record the heart's electrical activity. Doctors most often [place] these monitors to diagnose arrhythmias. Arrhythmias are problems with the speed or rhythm of the heartbeat. The monitor is a small, portable device. You can wear one while you do your normal daily activities. This is usually used to diagnose what is causing palpitations/syncope (passing out).    Follow-Up: At [facility] you and your health needs are our priority.  As part of our continuing mission to provide you with exceptional heart care, we have created designated Provider Care Teams.  These Care Teams include your primary Cardiologist (physician) and Advanced Practice Providers (APPs -  Physician Assistants and Nurse Practitioners) who all work together to provide you with the care you need, when you need it. You will need a follow up appointment in January  Please call our office 2 months in advance to schedule this appointment.  You may see Kate Sable, MD or one of the following Advanced Practice Providers on your designated Care Team:   Bernerd Pho, PA-C [facility]) . Ermalinda Barrios, PA-C ([place])  Any Other Special Instructions Will Be Listed Below (If Applicable). Thank  you for choosing [facility]!

## 2018-08-06 NOTE — Progress Notes (Signed)
Cardiology Office Note    Date:  08/06/2018   ID:  Vanessa Lara 13-Mar-1956, MRN [id number]  PCP:  Sinda Du, MD  Cardiologist: Kate Sable, MD    Chief Complaint  Patient presents with  . Follow-up    recent palpitations and dizziness    History of Present Illness:    Vanessa Lara is a 62 y.o. female with past medical history of nonischemic cardiomyopathy (EF 55-60% by echo in 10/2017 with low-risk NST in 11/2017), HLD, and RA who presents to the office today for evaluation of palpitations and chest pain.   She was examined by Dr. Bronson Ing on 07/05/2018 and reported having left-sided chest wall tightness and "spasms".  Her chest discomfort was overall felt to be atypical for a cardiac etiology and she was informed to continue on Lasix 40 mg as needed for worsening dyspnea or weight gain.  In the interim, she called the office on 07/12/2018 reporting occasional episodes of lightheadedness and chest discomfort. She went to her PCP's office and BP was well-controlled and she denied any recurrent symptoms. Continued monitoring of her symptoms was encouraged at that time. She called the after-hours line on 07/23/2018 for recurrent lightheadedness and nausea. BP was again well controlled at 108/87 and heart rate was mildly elevated to 111. Reported having these symptoms for over a year since being started on a new arthritis medication, therefore reassurance was provided that she was informed she could proceed to the ED if symptoms persisted.  In talking with the patient today, she reports a variety of symptoms over the past several months which started in 05/2018 when the medications for her rheumatoid arthritis were adjusted. She reports since then that she has had frequent episodes of dizziness with associated nausea. This can occur when sitting still or with ambulation. She does note associated palpitations at times and says she has a history of atrial fibrillation with  her most recent episode occurring 10+ years ago by her report.  She denies any exertional chest pain but does report having an aching sensation under her left breast at times. This can last for hours at a time and sometimes improves when she has a bowel movement.  She denies any recent dyspnea on exertion, orthopnea, or PND. She has a prescription for PRN Lasix but has not utilized this within the past several months. Weight has overall been stable on her home scales.  Past Medical History:  Diagnosis Date  . Arthritis   . Depression   . Endometriosis   . High cholesterol   . IBS (irritable bowel syndrome)   . Mitral valve prolapse   . Uterus, adenomyosis     Past Surgical History:  Procedure Laterality Date  . COLONOSCOPY    . COLONOSCOPY N/A 12/13/2015   Procedure: COLONOSCOPY;  Surgeon: Rogene Houston, MD;  Location: [facility];  Service: Endoscopy;  Laterality: N/A;  9:55  . Fatty tumor     2011 (left arm)  . TONSILLECTOMY    . TOTAL ABDOMINAL HYSTERECTOMY     2003    Current Medications: Outpatient Medications Prior to Visit  Medication Sig Dispense Refill  . abatacept (ORENCIA) 250 MG injection Inject 500 mg into the vein every 30 (thirty) days.    Marland Kitchen acetaminophen (TYLENOL) 650 MG CR tablet Take 1,300 mg by mouth daily as needed for pain.    Marland Kitchen atorvastatin (LIPITOR) 10 MG tablet Take 10 mg by mouth daily.    . diclofenac sodium (  VOLTAREN) 1 % GEL Apply 2 g topically 4 (four) times daily. 100 g 3  . diclofenac sodium (VOLTAREN) 1 % GEL Apply 4 g topically 4 (four) times daily as needed. 500 g 6  . furosemide (LASIX) 40 MG tablet Take 1 tablet (40 mg total) by mouth as needed. 45 tablet 6  . PARoxetine (PAXIL) 10 MG tablet Take 10 mg by mouth daily.     . potassium chloride SA (KLOR-CON M20) 20 MEQ tablet Take potassium as needed 90 tablet 3  . traZODone (DESYREL) 50 MG tablet Take 12.5 mg by mouth at bedtime.     . meloxicam (MOBIC) 7.5 MG tablet Take 7.5 mg by mouth  daily.     No facility-administered medications prior to visit.      Allergies:   Methotrexate derivatives   Social History   Socioeconomic History  . Marital status: Married    Spouse name: Not on file  . Number of children: Not on file  . Years of education: Not on file  . Highest education level: Not on file  Occupational History  . Not on file  Social Needs  . Financial resource strain: Not on file  . Food insecurity:    Worry: Not on file    Inability: Not on file  . Transportation needs:    Medical: Not on file    Non-medical: Not on file  Tobacco Use  . Smoking status: Former Smoker    Packs/day: 0.50    Years: 33.00    Pack years: 16.50    Types: Cigarettes  . Smokeless tobacco: Never Used  . Tobacco comment: quit 3 yrs ago  Substance and Sexual Activity  . Alcohol use: No    Alcohol/week: 0.0 standard drinks  . Drug use: No  . Sexual activity: Not on file  Lifestyle  . Physical activity:    Days per week: Not on file    Minutes per session: Not on file  . Stress: Not on file  Relationships  . Social connections:    Talks on phone: Not on file    Gets together: Not on file    Attends religious service: Not on file    Active member of club or organization: Not on file    Attends meetings of clubs or organizations: Not on file    Relationship status: Not on file  Other Topics Concern  . Not on file  Social History Narrative  . Not on file     Family History:  The patient's family history includes Breast cancer in her sister; Colon cancer in her other.   Review of Systems:   Please see the history of present illness.     General:  No chills, fever, night sweats or weight changes.  Cardiovascular:  No dyspnea on exertion, edema, orthopnea, paroxysmal nocturnal dyspnea. Positive for chest pain and palpitations.  Dermatological: No rash, lesions/masses Respiratory: No cough, dyspnea Urologic: No hematuria, dysuria Abdominal:   No nausea, vomiting,  diarrhea, bright red blood per rectum, melena, or hematemesis Neurologic:  No visual changes, wkns, changes in mental status. Positive for dizziness and paresthesias.   All other systems reviewed and are otherwise negative except as noted above.   Physical Exam:    VS:  BP (!) 114/58   Pulse 94   Ht 5' (1.524 m)   Wt 94 lb (42.6 kg)   BMI 18.36 kg/m    General: Well developed, thin-Caucasian female appearing in no acute distress. Head: Normocephalic,  atraumatic, sclera non-icteric, no xanthomas, nares are without discharge.  Neck: No carotid bruits. JVD not elevated.  Lungs: Respirations regular and unlabored, without wheezes or rales.  Heart: Regular rate and rhythm. No S3 or S4.  No murmur, no rubs, or gallops appreciated. Abdomen: Soft, non-tender, non-distended with normoactive bowel sounds. No hepatomegaly. No rebound/guarding. No obvious abdominal masses. Msk:  Strength and tone appear normal for age. No joint deformities or effusions. Extremities: No clubbing or cyanosis. No lower extremity edema.  Distal pedal pulses are 2+ bilaterally. Neuro: Alert and oriented X 3. Moves all extremities spontaneously. No focal deficits noted. Psych:  Responds to questions appropriately with a normal affect. Skin: No rashes or lesions noted  Wt Readings from Last 3 Encounters:  08/06/18 94 lb (42.6 kg)  07/05/18 98 lb 12.8 oz (44.8 kg)  03/28/18 107 lb (48.5 kg)     Studies/Labs Reviewed:   EKG:  EKG is ordered today.  The ekg ordered today demonstrates normal sinus rhythm, heart rate 94, with right axis deviation and no acute ST changes when compared to prior tracings.  Recent Labs: 11/26/2017: BUN 13; Potassium 3.0; Sodium 136 06/13/2018: Creatinine, Ser 0.50   Lipid Panel No results found for: CHOL, TRIG, HDL, CHOLHDL, VLDL, LDLCALC, LDLDIRECT  Additional studies/ records that were reviewed today include:   Echocardiogram:  10/15/2017 Study Conclusions  - Left ventricle: The  cavity size was normal. Wall thickness was   normal. Systolic function was normal. The estimated ejection   fraction was in the range of 55% to 60%. Diastolic dysfunction,   grade indeterminate. Normal filling pressures. - Regional wall motion abnormality: Mild hypokinesis of the apical   anterior, apical inferior, apical lateral, and apical myocardium. - Mitral valve: No significant mitral valve prolapse.  NST: 11/2017  There was no ST segment deviation noted during stress.  The study is normal. There are no perfusion defects  This is a low risk study.  The left ventricular ejection fraction is normal (55-65%).  Assessment:    1. Heart palpitations   2. Tachycardia   3. Chest pain, unspecified type   4. Cardiomyopathy, unspecified type ([place])   5. Dyslipidemia   6. Paresthesias      Plan:   In order of problems listed above:  1. Palpitations/Tachycardia - She reports intermittent episodes of dizziness over the past few months and reports associated palpitations when this occurs. No association with position changes. She does check heart rate occasionally and is elevated in the 90's to 110's. She is maintaining normal sinus rhythm by examination and EKG today. - Given her reported history of atrial fibrillation and new onset symptoms, will plan to obtain a 30-day cardiac event monitor to rule out any significant arrhythmias.  2. Atypical Chest Pain - reports an occasional aching sensation under her left breast which can last for hours and sometimes improves with a bowel movement. She does have a history of IBS. Denies any exertional chest discomfort. Given her recent low risk NST in 11/2017, atypical symptoms, and no acute changes by EKG, would not pursue further ischemic evaluation at this time. Was encouraged to follow-up with GI if her abdominal symptoms do not improve.   3. History of Cardiomyopathy - Most recent echocardiogram in 10/2017 showed a preserved EF of 55 to  60% with mild hypokinesis as noted above and low-risk NST. She denies any recent dyspnea on exertion, orthopnea, PND, or lower extremity edema. Appears euvolemic by examination today. Continue with PRN Lasix.  4. HLD - Followed by her PCP.  She remains on Atorvastatin 10 mg daily.  5. Paresthesias  - She reports having an episode of numbness and tingling along the left side of her face occurring 2 weeks ago. Denies any associated symptoms at that time and no repeat episodes since. No known history of Bell's Palsy and no focal neurological deficits appreciated on examination today. I informed her to follow-up with her PCP in regards to this and if she has recurrent symptoms, a referral to Neurology could be considered.   Medication Adjustments/Labs and Tests Ordered: Current medicines are reviewed at length with the patient today.  Concerns regarding medicines are outlined above.  Medication changes, Labs and Tests ordered today are listed in the Patient Instructions below. Patient Instructions  Medication Instructions:  Your physician recommends that you continue on your current medications as directed. Please refer to the Current Medication list given to you today.  If you need a refill on your cardiac medications before your next appointment, please call your pharmacy.   Lab work: NONE  If you have labs (blood work) drawn today and your tests are completely normal, you will receive your results only by: Marland Kitchen MyChart Message (if you have MyChart) OR . A paper copy in the mail If you have any lab test that is abnormal or we need to change your treatment, we will call you to review the results.  Testing/Procedures: Your physician has recommended that you wear an event monitor. Event monitors are medical devices that record the heart's electrical activity. Doctors most often [place] these monitors to diagnose arrhythmias. Arrhythmias are problems with the speed or rhythm of the heartbeat. The monitor  is a small, portable device. You can wear one while you do your normal daily activities. This is usually used to diagnose what is causing palpitations/syncope (passing out).    Follow-Up: At [facility], you and your health needs are our priority.  As part of our continuing mission to provide you with exceptional heart care, we have created designated Provider Care Teams.  These Care Teams include your primary Cardiologist (physician) and Advanced Practice Providers (APPs -  Physician Assistants and Nurse Practitioners) who all work together to provide you with the care you need, when you need it. You will need a follow up appointment in January  Please call our office 2 months in advance to schedule this appointment.  You may see Kate Sable, MD or one of the following Advanced Practice Providers on your designated Care Team:   Bernerd Pho, PA-C [facility]) . Ermalinda Barrios, PA-C ([place])  Any Other Special Instructions Will Be Listed Below (If Applicable). Thank you for choosing [facility]!        Signed, Erma Heritage, PA-C  08/06/2018 5:34 PM    [facility]. [address] [place], [place] [address] Phone: [phone]

## 2018-08-08 ENCOUNTER — Telehealth (INDEPENDENT_AMBULATORY_CARE_PROVIDER_SITE_OTHER): Payer: Self-pay | Admitting: Physical Medicine and Rehabilitation

## 2018-08-08 ENCOUNTER — Ambulatory Visit: Payer: BLUE CROSS/BLUE SHIELD | Admitting: Cardiovascular Disease

## 2018-08-08 NOTE — Telephone Encounter (Signed)
Patient is requesting a bilateral upper extremity EMG and also asking about lower extremity EMG. I advised her that we require a referral for EMGs and that she will need to see either Dr. Ninfa Linden or Dr. Junius Roads- both of whom she has seen before- to be evaluated and get this ordered.

## 2018-08-08 NOTE — Telephone Encounter (Signed)
Patient is requesting a call from Dr. Ernestina Patches to schedule an apt.  Patients # [phone]

## 2018-08-09 ENCOUNTER — Telehealth: Payer: Self-pay | Admitting: Podiatry

## 2018-08-09 NOTE — Telephone Encounter (Signed)
Patient spoke Dr. Paulla Dolly and Physical Therapy and she just wanted to follow up because she hasn't heard anything.

## 2018-08-12 NOTE — Telephone Encounter (Signed)
I think they are supposed to be scheduling her for pt

## 2018-08-13 ENCOUNTER — Telehealth: Payer: Self-pay | Admitting: Podiatry

## 2018-08-13 DIAGNOSIS — M79672 Pain in left foot: Principal | ICD-10-CM

## 2018-08-13 DIAGNOSIS — M2042 Other hammer toe(s) (acquired), left foot: Secondary | ICD-10-CM

## 2018-08-13 DIAGNOSIS — M779 Enthesopathy, unspecified: Secondary | ICD-10-CM

## 2018-08-13 DIAGNOSIS — M79671 Pain in right foot: Secondary | ICD-10-CM

## 2018-08-13 NOTE — Addendum Note (Signed)
Addended by: Harriett Sine D on: 08/13/2018 02:33 PM   Modules accepted: Orders

## 2018-08-13 NOTE — Telephone Encounter (Signed)
Pt states Dr. Paulla Dolly said she could be seen in the [facility] - In-office PT. Pt states Dr. Paulla Dolly is treating her for arthritis in her toes and capsulitis.

## 2018-08-13 NOTE — Telephone Encounter (Signed)
"   I havent received a call for Therapy on my foot, just wanting an update".

## 2018-08-14 NOTE — Telephone Encounter (Signed)
Would you please give me a copy of her last note

## 2018-08-15 ENCOUNTER — Ambulatory Visit: Payer: BLUE CROSS/BLUE SHIELD | Admitting: Podiatry

## 2018-08-15 DIAGNOSIS — I5032 Chronic diastolic (congestive) heart failure: Secondary | ICD-10-CM | POA: Diagnosis not present

## 2018-08-15 DIAGNOSIS — M069 Rheumatoid arthritis, unspecified: Secondary | ICD-10-CM | POA: Diagnosis not present

## 2018-08-15 DIAGNOSIS — K580 Irritable bowel syndrome with diarrhea: Secondary | ICD-10-CM | POA: Diagnosis not present

## 2018-08-19 ENCOUNTER — Ambulatory Visit (INDEPENDENT_AMBULATORY_CARE_PROVIDER_SITE_OTHER): Payer: BLUE CROSS/BLUE SHIELD | Admitting: Podiatry

## 2018-08-19 ENCOUNTER — Encounter: Payer: Self-pay | Admitting: Podiatry

## 2018-08-19 DIAGNOSIS — M779 Enthesopathy, unspecified: Secondary | ICD-10-CM | POA: Diagnosis not present

## 2018-08-20 ENCOUNTER — Other Ambulatory Visit (HOSPITAL_COMMUNITY): Payer: Self-pay | Admitting: Pulmonary Disease

## 2018-08-20 DIAGNOSIS — R4182 Altered mental status, unspecified: Secondary | ICD-10-CM

## 2018-08-20 NOTE — Progress Notes (Signed)
Subjective:   Patient ID: Vanessa Lara, female   DOB: 62 y.o.   MRN: [id number]   HPI Patient presents stating that she is still having pain underneath her metatarsals left over right but states the padding seem to make a difference for   ROS      Objective:  Physical Exam  Neurovascular status intact with patient's plantar pain improved with pain still present upon deep palpation but improved from previous     Assessment:  Capsulitis with diminished fat pad with probable flexor plate dislocation second metatarsal left     Plan:  H&P reviewed course and at this point recommended a very soft orthotic and had patient see ped orthotist who agrees and she scanned for customized orthotic devices.  Reappoint when returned and we are making them specifically for this condition

## 2018-08-21 DIAGNOSIS — M546 Pain in thoracic spine: Secondary | ICD-10-CM | POA: Diagnosis not present

## 2018-08-21 DIAGNOSIS — M9903 Segmental and somatic dysfunction of lumbar region: Secondary | ICD-10-CM | POA: Diagnosis not present

## 2018-08-21 DIAGNOSIS — M545 Low back pain: Secondary | ICD-10-CM | POA: Diagnosis not present

## 2018-08-23 DIAGNOSIS — M545 Low back pain: Secondary | ICD-10-CM | POA: Diagnosis not present

## 2018-08-23 DIAGNOSIS — M546 Pain in thoracic spine: Secondary | ICD-10-CM | POA: Diagnosis not present

## 2018-08-23 DIAGNOSIS — M9903 Segmental and somatic dysfunction of lumbar region: Secondary | ICD-10-CM | POA: Diagnosis not present

## 2018-08-23 DIAGNOSIS — M9902 Segmental and somatic dysfunction of thoracic region: Secondary | ICD-10-CM | POA: Diagnosis not present

## 2018-08-27 ENCOUNTER — Ambulatory Visit (HOSPITAL_COMMUNITY)
Admission: RE | Admit: 2018-08-27 | Discharge: 2018-08-27 | Disposition: A | Discharge status: Home / Self Care | Payer: BLUE CROSS/BLUE SHIELD | Source: Ambulatory Visit | Attending: Pulmonary Disease | Admitting: Pulmonary Disease

## 2018-08-27 DIAGNOSIS — R4182 Altered mental status, unspecified: Secondary | ICD-10-CM | POA: Diagnosis not present

## 2018-08-27 DIAGNOSIS — R51 Headache: Secondary | ICD-10-CM | POA: Diagnosis not present

## 2018-08-28 DIAGNOSIS — M545 Low back pain: Secondary | ICD-10-CM | POA: Diagnosis not present

## 2018-08-28 DIAGNOSIS — M9903 Segmental and somatic dysfunction of lumbar region: Secondary | ICD-10-CM | POA: Diagnosis not present

## 2018-08-28 DIAGNOSIS — M9902 Segmental and somatic dysfunction of thoracic region: Secondary | ICD-10-CM | POA: Diagnosis not present

## 2018-08-28 DIAGNOSIS — M546 Pain in thoracic spine: Secondary | ICD-10-CM | POA: Diagnosis not present

## 2018-09-03 DIAGNOSIS — M25571 Pain in right ankle and joints of right foot: Secondary | ICD-10-CM | POA: Diagnosis not present

## 2018-09-03 DIAGNOSIS — M9906 Segmental and somatic dysfunction of lower extremity: Secondary | ICD-10-CM | POA: Diagnosis not present

## 2018-09-04 DIAGNOSIS — I959 Hypotension, unspecified: Secondary | ICD-10-CM | POA: Diagnosis not present

## 2018-09-04 DIAGNOSIS — I5032 Chronic diastolic (congestive) heart failure: Secondary | ICD-10-CM | POA: Diagnosis not present

## 2018-09-04 DIAGNOSIS — D649 Anemia, unspecified: Secondary | ICD-10-CM | POA: Diagnosis not present

## 2018-09-04 DIAGNOSIS — F329 Major depressive disorder, single episode, unspecified: Secondary | ICD-10-CM | POA: Diagnosis not present

## 2018-09-09 ENCOUNTER — Other Ambulatory Visit: Payer: BLUE CROSS/BLUE SHIELD | Admitting: Orthotics

## 2018-09-09 ENCOUNTER — Ambulatory Visit: Payer: BLUE CROSS/BLUE SHIELD | Admitting: Podiatry

## 2018-09-16 ENCOUNTER — Ambulatory Visit (INDEPENDENT_AMBULATORY_CARE_PROVIDER_SITE_OTHER): Payer: BLUE CROSS/BLUE SHIELD | Admitting: Podiatry

## 2018-09-16 ENCOUNTER — Ambulatory Visit: Payer: BLUE CROSS/BLUE SHIELD | Admitting: Orthotics

## 2018-09-16 ENCOUNTER — Encounter: Payer: Self-pay | Admitting: Podiatry

## 2018-09-16 DIAGNOSIS — M7752 Other enthesopathy of left foot: Secondary | ICD-10-CM

## 2018-09-16 DIAGNOSIS — M2042 Other hammer toe(s) (acquired), left foot: Secondary | ICD-10-CM

## 2018-09-16 DIAGNOSIS — M779 Enthesopathy, unspecified: Secondary | ICD-10-CM

## 2018-09-16 DIAGNOSIS — M79671 Pain in right foot: Secondary | ICD-10-CM

## 2018-09-16 DIAGNOSIS — M79672 Pain in left foot: Secondary | ICD-10-CM

## 2018-09-16 MED ORDER — TRIAMCINOLONE ACETONIDE 10 MG/ML IJ SUSP
10.0000 mg | Freq: Once | INTRAMUSCULAR | Status: AC
  Administered 2018-09-16: 10 mg

## 2018-09-16 NOTE — Patient Instructions (Signed)

## 2018-09-16 NOTE — Progress Notes (Signed)
Patient came in today to pick up custom made foot orthotics.  The goals were accomplished and the patient reported no dissatisfaction with said orthotics.  Patient was advised of breakin period and how to report any issues. 

## 2018-09-17 DIAGNOSIS — K580 Irritable bowel syndrome with diarrhea: Secondary | ICD-10-CM | POA: Diagnosis not present

## 2018-09-17 DIAGNOSIS — F419 Anxiety disorder, unspecified: Secondary | ICD-10-CM | POA: Diagnosis not present

## 2018-09-17 DIAGNOSIS — I5032 Chronic diastolic (congestive) heart failure: Secondary | ICD-10-CM | POA: Diagnosis not present

## 2018-09-17 DIAGNOSIS — M069 Rheumatoid arthritis, unspecified: Secondary | ICD-10-CM | POA: Diagnosis not present

## 2018-09-18 NOTE — Progress Notes (Signed)
Subjective:   Patient ID: Vanessa Lara, female   DOB: 62 y.o.   MRN: [id number]   HPI Patient seems distressed about her foot currently stating that the area that I do the work got is better but the joint next to it is been very sore and making it hard for her to walk comfortably   ROS      Objective:  Physical Exam  Neurovascular status is unchanged with patient in very poor health with inflammation of the third MPJ with a second MPJ doing better.  Orthotics are not fitted properly and she is get a work with Scientist, forensic and again she does have numerous other problems present     Assessment:  Inflammatory capsulitis third MPJ left with second improving     Plan:  Reviewed condition working to try to focus on the third MPJ and I carefully injected it after aspirating it and doing proximal nerve block.  She was somewhat lightheaded afterwards and she did stay in the office and then left in satisfactory condition and I did not use Kenalog for this I just went ahead and use dexamethasone due to her problems with Kenalog in the past.  She is in a very difficult situation here we are trying to avoid surgery which at one point may be necessary but at this point working to work with the orthotics and hopefully be able to give her reduction of her symptoms

## 2018-09-19 DIAGNOSIS — K582 Mixed irritable bowel syndrome: Secondary | ICD-10-CM | POA: Diagnosis not present

## 2018-09-20 ENCOUNTER — Other Ambulatory Visit: Payer: Self-pay | Admitting: Cardiovascular Disease

## 2018-09-23 ENCOUNTER — Other Ambulatory Visit: Payer: Self-pay | Admitting: Pulmonary Disease

## 2018-09-23 ENCOUNTER — Other Ambulatory Visit (HOSPITAL_COMMUNITY): Payer: Self-pay | Admitting: Pulmonary Disease

## 2018-09-23 DIAGNOSIS — M549 Dorsalgia, unspecified: Secondary | ICD-10-CM

## 2018-09-23 DIAGNOSIS — M545 Low back pain, unspecified: Secondary | ICD-10-CM

## 2018-09-23 DIAGNOSIS — M546 Pain in thoracic spine: Secondary | ICD-10-CM

## 2018-10-01 DIAGNOSIS — R140 Abdominal distension (gaseous): Secondary | ICD-10-CM | POA: Diagnosis not present

## 2018-10-01 DIAGNOSIS — K582 Mixed irritable bowel syndrome: Secondary | ICD-10-CM | POA: Diagnosis not present

## 2018-10-01 DIAGNOSIS — R109 Unspecified abdominal pain: Secondary | ICD-10-CM | POA: Diagnosis not present

## 2018-10-01 DIAGNOSIS — K5900 Constipation, unspecified: Secondary | ICD-10-CM | POA: Diagnosis not present

## 2018-10-01 DIAGNOSIS — R634 Abnormal weight loss: Secondary | ICD-10-CM | POA: Diagnosis not present

## 2018-10-01 DIAGNOSIS — R197 Diarrhea, unspecified: Secondary | ICD-10-CM | POA: Diagnosis not present

## 2018-10-01 DIAGNOSIS — K909 Intestinal malabsorption, unspecified: Secondary | ICD-10-CM | POA: Diagnosis not present

## 2018-10-03 ENCOUNTER — Ambulatory Visit (HOSPITAL_COMMUNITY)
Admission: RE | Admit: 2018-10-03 | Discharge: 2018-10-03 | Disposition: A | Discharge status: Home / Self Care | Payer: BLUE CROSS/BLUE SHIELD | Source: Ambulatory Visit | Attending: Pulmonary Disease | Admitting: Pulmonary Disease

## 2018-10-03 DIAGNOSIS — M48061 Spinal stenosis, lumbar region without neurogenic claudication: Secondary | ICD-10-CM | POA: Diagnosis not present

## 2018-10-03 DIAGNOSIS — M546 Pain in thoracic spine: Secondary | ICD-10-CM | POA: Diagnosis not present

## 2018-10-03 DIAGNOSIS — M5124 Other intervertebral disc displacement, thoracic region: Secondary | ICD-10-CM | POA: Diagnosis not present

## 2018-10-03 DIAGNOSIS — M47814 Spondylosis without myelopathy or radiculopathy, thoracic region: Secondary | ICD-10-CM | POA: Diagnosis not present

## 2018-10-03 DIAGNOSIS — M47816 Spondylosis without myelopathy or radiculopathy, lumbar region: Secondary | ICD-10-CM | POA: Diagnosis not present

## 2018-10-03 DIAGNOSIS — M5126 Other intervertebral disc displacement, lumbar region: Secondary | ICD-10-CM | POA: Diagnosis not present

## 2018-10-03 DIAGNOSIS — M545 Low back pain, unspecified: Secondary | ICD-10-CM

## 2018-10-04 ENCOUNTER — Ambulatory Visit (HOSPITAL_COMMUNITY): Payer: BLUE CROSS/BLUE SHIELD

## 2018-10-04 ENCOUNTER — Other Ambulatory Visit (HOSPITAL_COMMUNITY): Payer: BLUE CROSS/BLUE SHIELD

## 2018-10-10 DIAGNOSIS — K580 Irritable bowel syndrome with diarrhea: Secondary | ICD-10-CM | POA: Diagnosis not present

## 2018-10-10 DIAGNOSIS — I5032 Chronic diastolic (congestive) heart failure: Secondary | ICD-10-CM | POA: Diagnosis not present

## 2018-10-10 DIAGNOSIS — R531 Weakness: Secondary | ICD-10-CM | POA: Diagnosis not present

## 2018-10-10 DIAGNOSIS — M069 Rheumatoid arthritis, unspecified: Secondary | ICD-10-CM | POA: Diagnosis not present

## 2018-10-10 DIAGNOSIS — I959 Hypotension, unspecified: Secondary | ICD-10-CM | POA: Diagnosis not present

## 2018-10-10 DIAGNOSIS — E559 Vitamin D deficiency, unspecified: Secondary | ICD-10-CM | POA: Diagnosis not present

## 2018-10-14 ENCOUNTER — Ambulatory Visit: Payer: BLUE CROSS/BLUE SHIELD | Admitting: Cardiovascular Disease

## 2018-10-15 ENCOUNTER — Encounter: Payer: Self-pay | Admitting: Cardiovascular Disease

## 2018-10-21 ENCOUNTER — Ambulatory Visit: Payer: BLUE CROSS/BLUE SHIELD | Admitting: Podiatry

## 2018-10-28 DIAGNOSIS — K582 Mixed irritable bowel syndrome: Secondary | ICD-10-CM | POA: Insufficient documentation

## 2018-10-30 ENCOUNTER — Ambulatory Visit (INDEPENDENT_AMBULATORY_CARE_PROVIDER_SITE_OTHER): Payer: BLUE CROSS/BLUE SHIELD | Admitting: Orthopaedic Surgery

## 2018-10-30 DIAGNOSIS — M4312 Spondylolisthesis, cervical region: Secondary | ICD-10-CM | POA: Diagnosis not present

## 2018-10-30 DIAGNOSIS — K589 Irritable bowel syndrome without diarrhea: Secondary | ICD-10-CM | POA: Diagnosis not present

## 2018-10-30 DIAGNOSIS — M0579 Rheumatoid arthritis with rheumatoid factor of multiple sites without organ or systems involvement: Secondary | ICD-10-CM | POA: Diagnosis not present

## 2018-10-30 DIAGNOSIS — M8949 Other hypertrophic osteoarthropathy, multiple sites: Secondary | ICD-10-CM | POA: Diagnosis not present

## 2018-10-30 DIAGNOSIS — M4316 Spondylolisthesis, lumbar region: Secondary | ICD-10-CM | POA: Diagnosis not present

## 2018-10-31 ENCOUNTER — Ambulatory Visit (INDEPENDENT_AMBULATORY_CARE_PROVIDER_SITE_OTHER): Payer: BLUE CROSS/BLUE SHIELD | Admitting: Physician Assistant

## 2018-10-31 ENCOUNTER — Telehealth (INDEPENDENT_AMBULATORY_CARE_PROVIDER_SITE_OTHER): Payer: Self-pay | Admitting: Orthopaedic Surgery

## 2018-10-31 NOTE — Telephone Encounter (Signed)
See below

## 2018-10-31 NOTE — Telephone Encounter (Signed)
Pt called in wanting to let Dr know connective tissue disease. Through [facility]. Found out yesterday. Wants to hold off on seeing doctor. But wants Dr to look at Ct scan from [facility] over a month ago for his opinion. Pt said if any questions give her a call at [phone]

## 2018-11-04 DIAGNOSIS — M546 Pain in thoracic spine: Secondary | ICD-10-CM | POA: Diagnosis not present

## 2018-11-04 DIAGNOSIS — M545 Low back pain: Secondary | ICD-10-CM | POA: Diagnosis not present

## 2018-11-04 DIAGNOSIS — M9902 Segmental and somatic dysfunction of thoracic region: Secondary | ICD-10-CM | POA: Diagnosis not present

## 2018-11-04 DIAGNOSIS — M9903 Segmental and somatic dysfunction of lumbar region: Secondary | ICD-10-CM | POA: Diagnosis not present

## 2018-11-05 ENCOUNTER — Encounter (INDEPENDENT_AMBULATORY_CARE_PROVIDER_SITE_OTHER): Payer: Self-pay | Admitting: Family Medicine

## 2018-11-05 ENCOUNTER — Ambulatory Visit (INDEPENDENT_AMBULATORY_CARE_PROVIDER_SITE_OTHER): Payer: BLUE CROSS/BLUE SHIELD | Admitting: Family Medicine

## 2018-11-05 VITALS — BP 112/60 | HR 80

## 2018-11-05 DIAGNOSIS — M25531 Pain in right wrist: Secondary | ICD-10-CM

## 2018-11-05 DIAGNOSIS — R200 Anesthesia of skin: Secondary | ICD-10-CM

## 2018-11-05 NOTE — Progress Notes (Signed)
Vanessa Lara - 63 y.o. female MRN [id number]  Date of birth: [birth year]    SUBJECTIVE:      Chief Complaint: right wrist pain  HPI:  63 year old female with rheumatoid arthritis presents with about 1 week of dorsal sided right wrist pain.  She denies any specific injury.  She believes her pain started after she switched her cane use to her right hand.  She localizes her pain to the dorsal aspect of the distal ulna.  She also feels pain in the right third MCP.  She tried wearing a volar splint but this placed too much pressure on the distal ulna.  She is not found anything that provides relief.  She does report having some swelling.  This has subsided at the third MCP.  She denies any significant erythema or warmth.  No numbness or tingling distally.  She reports pain with motion of the wrist.   ROS:     See HPI  PERTINENT  PMH / PSH FH / / SH:  Past Medical, Surgical, Social, and Family History Reviewed & Updated in the EMR.  Pertinent findings include:  Rheumatoid arthritis  OBJECTIVE: There were no vitals taken for this visit.  Physical Exam:  Vital signs are reviewed.  GEN: Alert and oriented, NAD Pulm: Breathing unlabored PSY: normal mood, congruent affect  MSK: Right hand/wrist: Inspection: There is prominence of the distal ulna dorsally.  Slight soft tissue swelling noted.  There also appears to be slight swelling of the third MCP Palpation: Somewhat diffuse tenderness over the dorsum of the wrist.  She is more tender over the distal ulna ROM: Limited range of motion of the right wrist due to pain.  She is able to make a full fist Strength: Slightly reduced restricted the pain. Neurovascular: NV intact  MSK [place]: Ultrasound evaluation of the distal ulna shows an effusion at the distal ulnocarpal joint.  There appears to be slight areas of tenosynovitis of the ECU.  The TFCC, while appearing intact, does have some chronic calcific changes.  No ECU subluxation with dynamic  visualization.  Over the third MCP there is no significant effusion.  She does have subtle changes that appear consistent with chondrocalcinosis.  Left hand/wrist: No obvious deformity or swelling Full range of motion of the wrist and fingers 5/5 strength without pain N/V intact distal   ASSESSMENT & PLAN:  1.  Right wrist pain-patient has focal areas of wrist pain at the distal ulna and third MCP.  Ultrasound performed today as detailed above.  This is likely due to her rheumatoid arthritis, however the chondrocalcinosis seen at the third MCP raises suspicion for gout.  She has no previous history of gout. -Today we treated her with steroid injections performed into the distal ulnocarpal and third MCP joints as described below. - Recommend she discuss gout with her rheumatologist -Follow-up as needed   Procedure performed: Third MCP joint corticosteroid injection; US guided Consent obtained and verified. Time-out conducted. Noted no overlying erythema, induration, or other signs of local infection. The right third MCP was visualized with ultrasound. The over overlying skin was prepped prepped in a sterile fashion. Topical analgesic spray: Ethyl chloride. Joint: Right first MTP Needle: 27GA, 1.25 inch Completed without difficulty. Meds: DepoMedrol 20 mg, lidocaine 0.5 cc   Procedure performed: Right ulnocarpal joint corticosteroid injection; US guided Consent obtained and verified. Time-out conducted. Noted no overlying erythema, induration, or other signs of local infection. Using a dorsal approach, the right tulnocarpal was visualized with  ultrasound. The over overlying skin was prepped prepped in a sterile fashion. Topical analgesic spray: Ethyl chloride. Joint: Right first MTP Needle: 27GA, 1.25 inch Completed without difficulty. Meds: DepoMedrol 20 mg, lidocaine 0.5 cc

## 2018-11-05 NOTE — Progress Notes (Signed)
I saw and examined the patient with Dr. Okey Dupre and agree with assessment and plan as outlined.  Right wrist pain with synovitis on ultrasound, chondrocalcinosis of the third MCP joint suspicious for gout.  Injected third MCP joint and also the distal ulna today.  Follow-up as needed.

## 2018-11-06 ENCOUNTER — Ambulatory Visit (INDEPENDENT_AMBULATORY_CARE_PROVIDER_SITE_OTHER): Payer: BLUE CROSS/BLUE SHIELD | Admitting: Family Medicine

## 2018-11-14 ENCOUNTER — Telehealth (INDEPENDENT_AMBULATORY_CARE_PROVIDER_SITE_OTHER): Payer: Self-pay | Admitting: Family Medicine

## 2018-11-14 NOTE — Telephone Encounter (Signed)
Patient would like to know what medication was used in the injection she was given by Dr Junius Roads at her last office visit. Patient's call back # [phone]

## 2018-11-14 NOTE — Telephone Encounter (Signed)
Tried calling this number - keep getting message that the call cannot be completed as dialed. Will try again at another time.

## 2018-11-15 NOTE — Telephone Encounter (Signed)
Getting the same message when I call the patient. Will hold this message and try again later.

## 2018-11-15 NOTE — Telephone Encounter (Signed)
Tried again - same message.  Will try again on Monday.

## 2018-11-18 NOTE — Telephone Encounter (Signed)
I tried again - got the same message "call cannot be completed as dialed." No other phone number listed in demographics. Will just sign off on this and address it if she calls back.

## 2018-11-25 NOTE — Progress Notes (Deleted)
Psychiatric Initial Adult Assessment   Patient Identification: Vanessa Lara MRN:  [id number] Date of Evaluation:  11/25/2018 Referral Source: Sinda Du, MD Chief Complaint:   Visit Diagnosis: No diagnosis found.  History of Present Illness:   Vanessa Lara is a 64 y.o. year old female with a history of bipolar by report, RA, osteoarthritis, non ischemic cardiomyopathy, hyperlipidemia, who is referred for bipolar disorder.    Associated Signs/Symptoms: Depression Symptoms:  {DEPRESSION SYMPTOMS:20000} (Hypo) Manic Symptoms:  {BHH MANIC SYMPTOMS:22872} Anxiety Symptoms:  {BHH ANXIETY SYMPTOMS:22873} Psychotic Symptoms:  {BHH PSYCHOTIC SYMPTOMS:22874} PTSD Symptoms: {BHH PTSD SYMPTOMS:22875}  Past Psychiatric History:  Outpatient:  Psychiatry admission:  Previous suicide attempt:  Past trials of medication:  History of violence:   Previous Psychotropic Medications: {YES/NO:21197}  Substance Abuse History in the last 12 months:  {yes no:314532}  Consequences of Substance Abuse: {BHH CONSEQUENCES OF SUBSTANCE ABUSE:22880}  Past Medical History:  Past Medical History:  Diagnosis Date  . Arthritis   . Depression   . Endometriosis   . High cholesterol   . IBS (irritable bowel syndrome)   . Mitral valve prolapse   . Uterus, adenomyosis     Past Surgical History:  Procedure Laterality Date  . COLONOSCOPY    . COLONOSCOPY N/A 12/13/2015   Procedure: COLONOSCOPY;  Surgeon: Rogene Houston, MD;  [facility];  Service: Endoscopy;  Laterality: N/A;  9:55  . Fatty tumor     2011 (left arm)  . TONSILLECTOMY    . TOTAL ABDOMINAL HYSTERECTOMY     2003    Family Psychiatric History: ***  Family History:  Family History  Problem Relation Age of Onset  . Colon cancer Other   . Breast cancer Sister     Social History:   Social History   Socioeconomic History  . Marital status: Married    Spouse name: Not on file  . Number of children: Not on file   . Years of education: Not on file  . Highest education level: Not on file  Occupational History  . Not on file  Social Needs  . Financial resource strain: Not on file  . Food insecurity:    Worry: Not on file    Inability: Not on file  . Transportation needs:    Medical: Not on file    Non-medical: Not on file  Tobacco Use  . Smoking status: Former Smoker    Packs/day: 0.50    Years: 33.00    Pack years: 16.50    Types: Cigarettes  . Smokeless tobacco: Never Used  . Tobacco comment: quit 3 yrs ago  Substance and Sexual Activity  . Alcohol use: No    Alcohol/week: 0.0 standard drinks  . Drug use: No  . Sexual activity: Not on file  Lifestyle  . Physical activity:    Days per week: Not on file    Minutes per session: Not on file  . Stress: Not on file  Relationships  . Social connections:    Talks on phone: Not on file    Gets together: Not on file    Attends religious service: Not on file    Active member of club or organization: Not on file    Attends meetings of clubs or organizations: Not on file    Relationship status: Not on file  Other Topics Concern  . Not on file  Social History Narrative  . Not on file    Additional Social History: ***  Allergies:  Allergies  Allergen Reactions  . Methotrexate Derivatives Other (See Comments)    Agitation; pt stated, "I get no sleep; one dose caused no sleep for 7 days and 7 nights - that was when I got a 0.5 injection"    Metabolic Disorder Labs: No results found for: HGBA1C, MPG No results found for: PROLACTIN No results found for: CHOL, TRIG, HDL, CHOLHDL, VLDL, LDLCALC No results found for: TSH  Therapeutic Level Labs: No results found for: LITHIUM No results found for: CBMZ No results found for: VALPROATE  Current Medications: Current Outpatient Medications  Medication Sig Dispense Refill  . acetaminophen (TYLENOL) 650 MG CR tablet Take 1,300 mg by mouth daily as needed for pain.    Marland Kitchen atorvastatin  (LIPITOR) 10 MG tablet Take 10 mg by mouth daily.    . Cholecalciferol (VITAMIN D3) 1.25 MG (50000 UT) CAPS TK 1 C PO Q 7 DAYS    . traMADol (ULTRAM) 50 MG tablet TK 1 T PO QID PRN    . traZODone (DESYREL) 50 MG tablet Take 12.5 mg by mouth at bedtime.      No current facility-administered medications for this visit.     Musculoskeletal: Strength & Muscle Tone: within normal limits Gait & Station: normal Patient leans: N/A  Psychiatric Specialty Exam: ROS  There were no vitals taken for this visit.There is no height or weight on file to calculate BMI.  General Appearance: Fairly Groomed  Eye Contact:  Good  Speech:  Clear and Coherent  Volume:  Normal  Mood:  {BHH MOOD:22306}  Affect:  {Affect (PAA):22687}  Thought Process:  Coherent  Orientation:  Full (Time, Place, and Person)  Thought Content:  Logical  Suicidal Thoughts:  {ST/HT (PAA):22692}  Homicidal Thoughts:  {ST/HT (PAA):22692}  Memory:  Immediate;   Good  Judgement:  {Judgement (PAA):22694}  Insight:  {Insight (PAA):22695}  Psychomotor Activity:  Normal  Concentration:  Concentration: Good and Attention Span: Good  Recall:  Good  Fund of Knowledge:Good  Language: Good  Akathisia:  No  Handed:  Right  AIMS (if indicated):  not done  Assets:  Communication Skills Desire for Improvement  ADL's:  Intact  Cognition: WNL  Sleep:  {BHH GOOD/FAIR/POOR:22877}   Screenings:   Assessment and Plan:  Assessment  Plan  The patient demonstrates the following risk factors for suicide: Chronic risk factors for suicide include: {Chronic Risk Factors for PVVZSMO:70786754}. Acute risk factors for suicide include: {Acute Risk Factors for GBEEFEO:71219758}. Protective factors for this patient include: {Protective Factors for Suicide ITGP:49826415}. Considering these factors, the overall suicide risk at this point appears to be {Desc; low/moderate/high:110033}. Patient {ACTION; IS/IS AXE:94076808} appropriate for outpatient  follow up.    Norman Clay, MD 2/24/20201:52 PM

## 2018-12-02 ENCOUNTER — Ambulatory Visit (HOSPITAL_COMMUNITY): Payer: Self-pay | Admitting: Psychiatry

## 2018-12-30 DIAGNOSIS — E876 Hypokalemia: Secondary | ICD-10-CM | POA: Diagnosis not present

## 2018-12-30 DIAGNOSIS — I5032 Chronic diastolic (congestive) heart failure: Secondary | ICD-10-CM | POA: Diagnosis not present

## 2018-12-30 DIAGNOSIS — D509 Iron deficiency anemia, unspecified: Secondary | ICD-10-CM | POA: Diagnosis not present

## 2018-12-30 DIAGNOSIS — N281 Cyst of kidney, acquired: Secondary | ICD-10-CM | POA: Diagnosis not present

## 2018-12-30 DIAGNOSIS — D649 Anemia, unspecified: Secondary | ICD-10-CM | POA: Diagnosis not present

## 2018-12-30 DIAGNOSIS — G4700 Insomnia, unspecified: Secondary | ICD-10-CM | POA: Diagnosis not present

## 2018-12-31 DIAGNOSIS — M150 Primary generalized (osteo)arthritis: Secondary | ICD-10-CM | POA: Diagnosis not present

## 2018-12-31 DIAGNOSIS — M159 Polyosteoarthritis, unspecified: Secondary | ICD-10-CM | POA: Insufficient documentation

## 2018-12-31 DIAGNOSIS — M0579 Rheumatoid arthritis with rheumatoid factor of multiple sites without organ or systems involvement: Secondary | ICD-10-CM | POA: Diagnosis not present

## 2018-12-31 DIAGNOSIS — Z7951 Long term (current) use of inhaled steroids: Secondary | ICD-10-CM | POA: Diagnosis not present

## 2018-12-31 DIAGNOSIS — M8949 Other hypertrophic osteoarthropathy, multiple sites: Secondary | ICD-10-CM | POA: Insufficient documentation

## 2018-12-31 DIAGNOSIS — Z87891 Personal history of nicotine dependence: Secondary | ICD-10-CM | POA: Diagnosis not present

## 2019-02-03 ENCOUNTER — Other Ambulatory Visit: Payer: Self-pay | Admitting: Cardiovascular Disease

## 2019-04-07 IMAGING — MG DIGITAL SCREENING BILATERAL MAMMOGRAM WITH TOMO AND CAD
8 series · 9 of 24 positions shown · non-contrast
Comparison: None.

CLINICAL DATA: Screening. New baseline examination

EXAM:
DIGITAL SCREENING BILATERAL MAMMOGRAM WITH TOMO AND CAD

[R CC synth-2D]
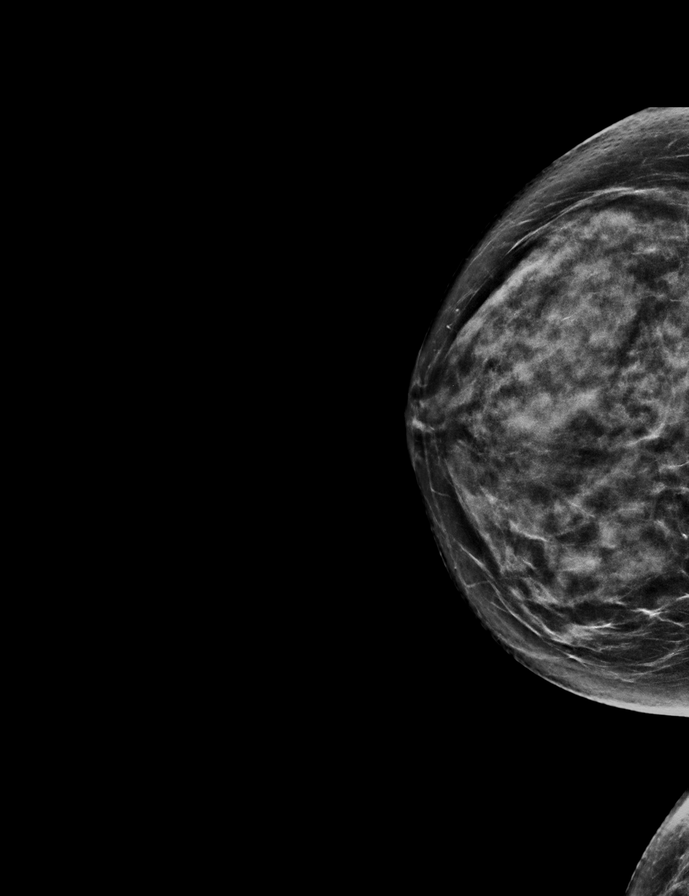

[L MLO synth-2D]
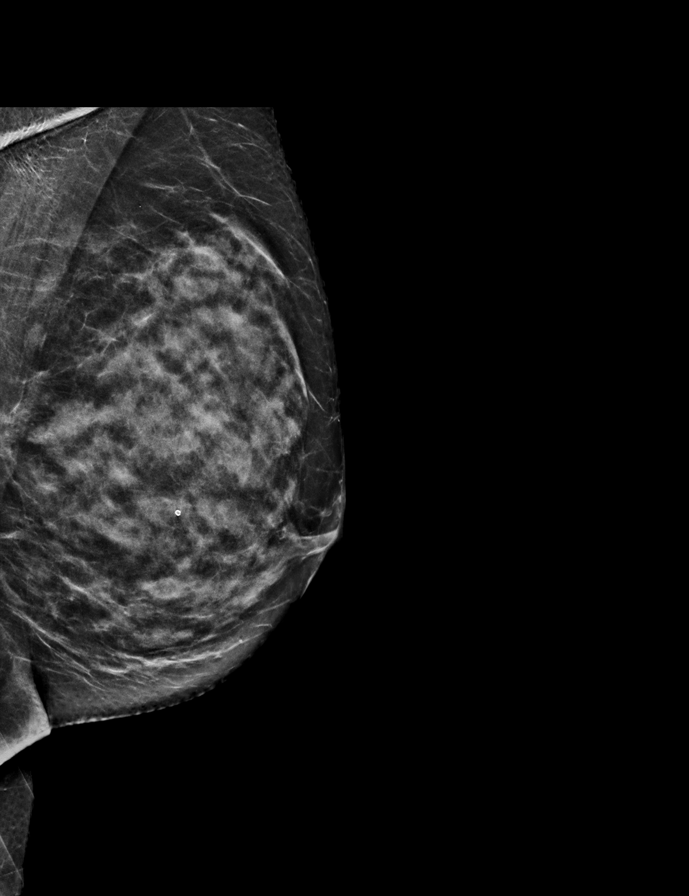

[R MLO synth-2D]
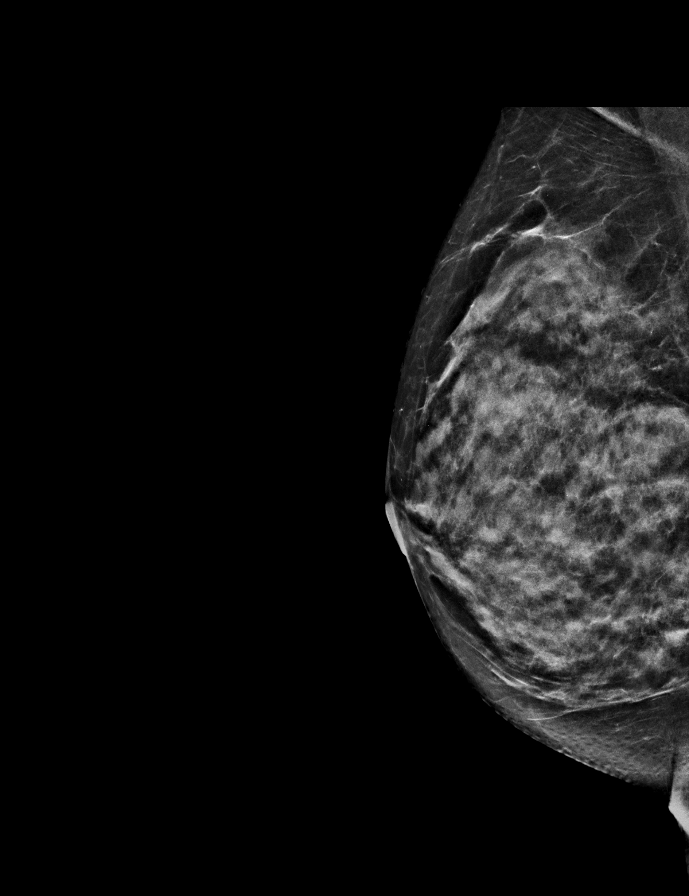

[L CC synth-2D]
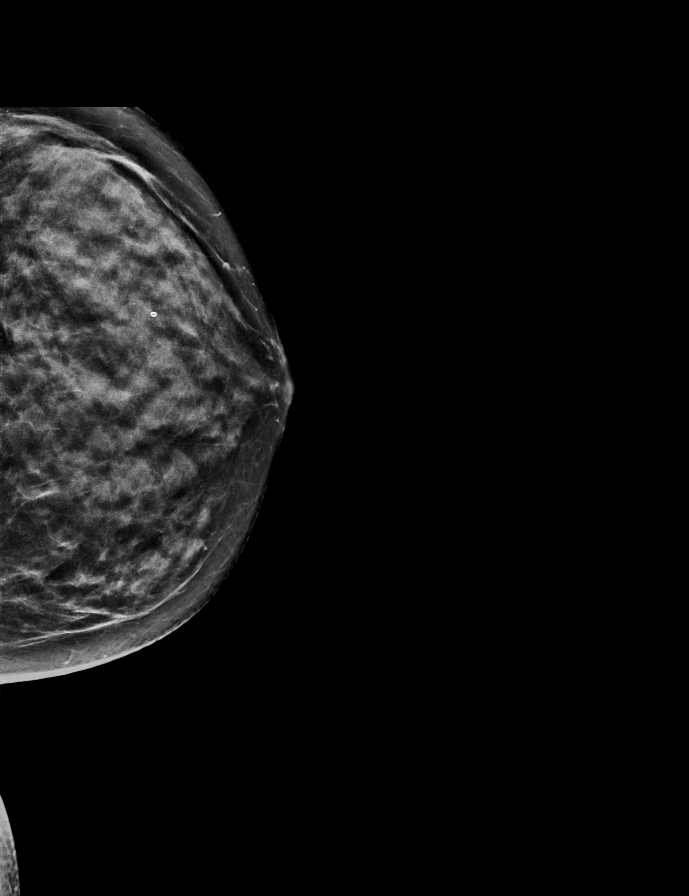

[L CC tomo · 2 of 58 frames shown]
[frame 19/58]
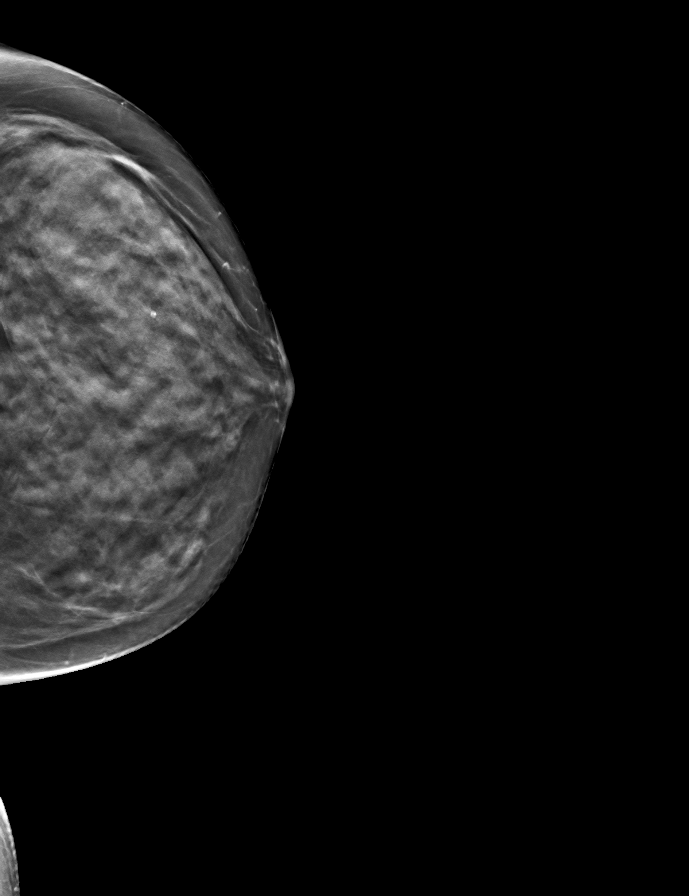
[frame 29/58]
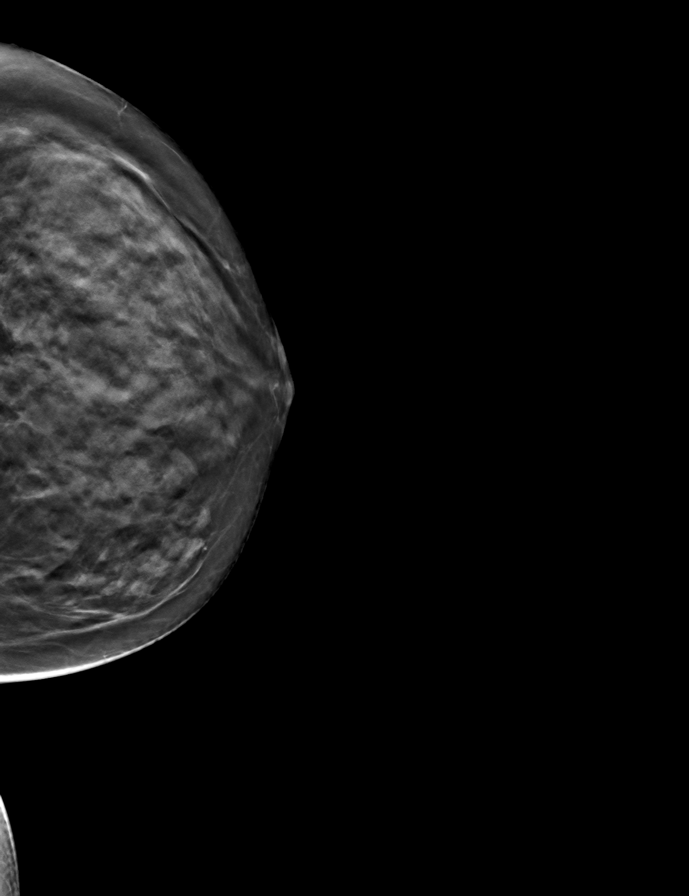

[R CC tomo · tomo slice 31/62.0]
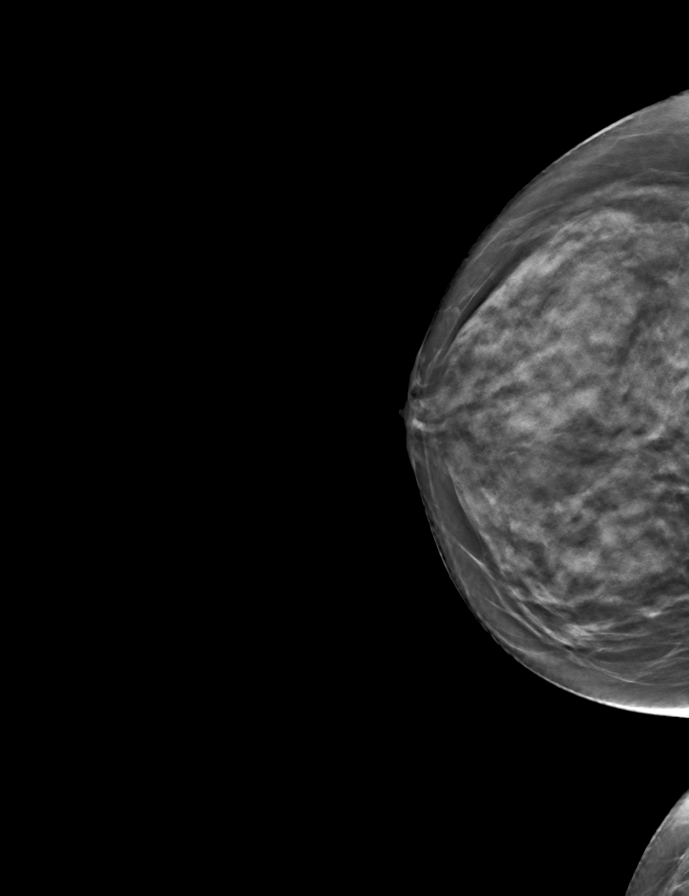

[L MLO tomo · tomo slice 29/57.0]
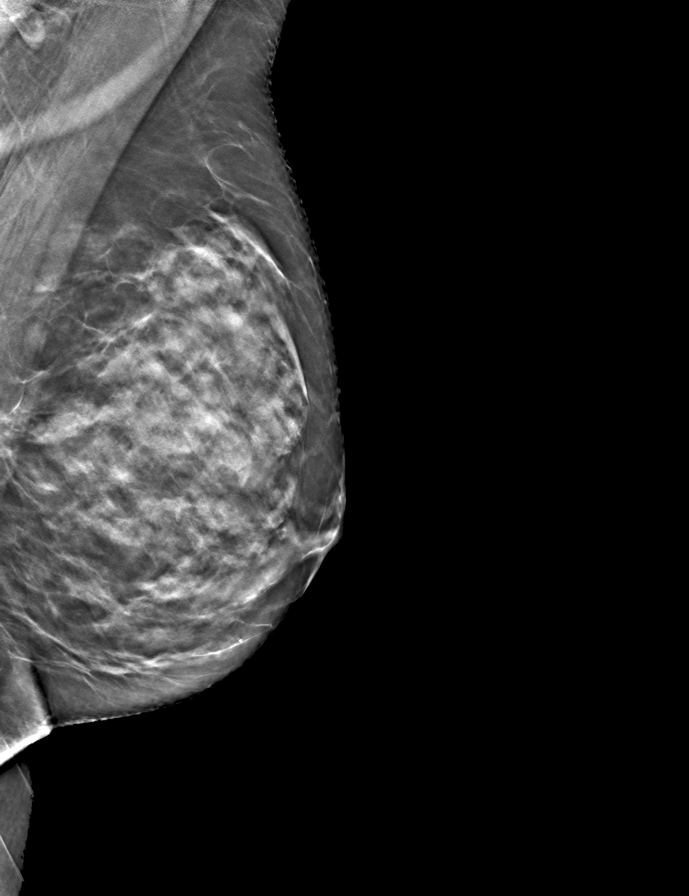

[R MLO tomo · tomo slice 27/54.0]
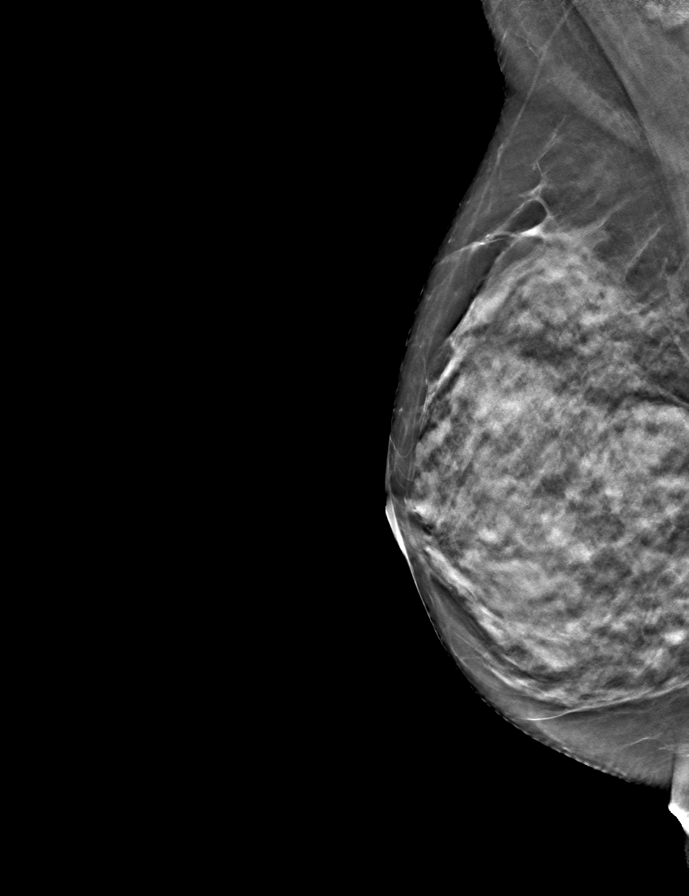

[9 of 24 positions shown; findings below may reference images not displayed]

ACR Breast Density Category c: The breast tissue is heterogeneously
dense, which may obscure small masses
FINDINGS: There are no findings suspicious for malignancy. Images were
processed with CAD.
IMPRESSION: No mammographic evidence of malignancy. A result letter of this
screening mammogram will be mailed directly to the patient.

RECOMMENDATION:
Screening mammogram in one year. (Code:[id number])

BI-RADS CATEGORY  1: Negative.

## 2019-05-14 ENCOUNTER — Ambulatory Visit: Payer: BLUE CROSS/BLUE SHIELD | Admitting: Family Medicine

## 2019-05-15 ENCOUNTER — Ambulatory Visit (INDEPENDENT_AMBULATORY_CARE_PROVIDER_SITE_OTHER): Payer: BC Managed Care – PPO | Admitting: Family Medicine

## 2019-05-15 ENCOUNTER — Encounter: Payer: Self-pay | Admitting: Family Medicine

## 2019-05-15 ENCOUNTER — Other Ambulatory Visit: Payer: Self-pay

## 2019-05-15 DIAGNOSIS — M25551 Pain in right hip: Secondary | ICD-10-CM

## 2019-05-15 DIAGNOSIS — M25532 Pain in left wrist: Secondary | ICD-10-CM

## 2019-05-15 DIAGNOSIS — R5383 Other fatigue: Secondary | ICD-10-CM

## 2019-05-15 DIAGNOSIS — M25531 Pain in right wrist: Secondary | ICD-10-CM

## 2019-05-15 DIAGNOSIS — M0579 Rheumatoid arthritis with rheumatoid factor of multiple sites without organ or systems involvement: Secondary | ICD-10-CM | POA: Diagnosis not present

## 2019-05-15 DIAGNOSIS — E559 Vitamin D deficiency, unspecified: Secondary | ICD-10-CM | POA: Diagnosis not present

## 2019-05-15 DIAGNOSIS — M25552 Pain in left hip: Secondary | ICD-10-CM

## 2019-05-15 NOTE — Progress Notes (Signed)
Office Visit Note   Patient: Vanessa Lara           Date of Birth: 1956-02-19           MRN: [id number] Visit Date: 05/15/2019 Requested by: Sinda Du, MD [address] [place],  [place] [address] PCP: Sinda Du, MD  Subjective: Chief Complaint  Patient presents with  . Right Wrist - Pain  . Left Wrist - Pain    HPI: She is here with bilateral wrist pain.  The right one hurts on the dorsal aspect and the left one hurts on the volar.  Cortisone injection of the right wrist in February gave excellent relief until of the neck on a month or 2 ago.  She would like injections in both wrists.  She also would like help managing her rheumatoid arthritis and irritable bowel syndrome without immune modulating medications.  She would like some suggestions.  She also wants me to take over primary care since her doctor is retiring.  She has not had labs in a while and has been struggling with chronic fatigue in addition to her rheumatoid arthritis pain.  She has been diagnosed with vitamin D deficiency as well.              ROS: No fevers or chills.  All other systems were reviewed and are negative.  Objective: Vital Signs: There were no vitals taken for this visit.  Physical Exam:  General:  Alert and oriented, in no acute distress. Pulm:  Breathing unlabored. Psy:  Normal mood, congruent affect. Skin: No visible rash. Right wrist: Dorsal swelling over the wrist with no warmth or erythema. Left wrist: Volar swelling in the midline of the wrist with no erythema or warmth.  Positive Tinel's positive Tinel's at the carpal tunnel and positive Phalen's test.  Imaging: Limited diagnostic ultrasound of the wrist: The right wrist has synovitis dorsally diffusely.  The left wrist also has synovitis on the volar aspect, appears to be some swelling of the median nerve as well.  No focal fluid collection or cyst.  Assessment & Plan: 1.  Bilateral wrist pain with synovitis, grip,  rheumatoid arthritis. -Both wrists injected under ultrasound guidance today.  2.  Rheumatoid arthritis -I will discuss some conservative management with her.  3.  Irritable bowel syndrome -We will try some dietary changes.  4.  Chronic fatigue -Labs to evaluate.  5.  Vitamin D deficiency -Recheck levels today.     Procedures: Bilateral wrist injections: After sterile prep with Betadine, injected 1 cc 1% lidocaine without epinephrine and 40 mg of methylprednisolone from dorsal approach into the right wrist and right wrist and volar approach into the left without complication.    PMFS History: Patient Active Problem List   Diagnosis Date Noted  . Constipation 03/14/2016  . Mitral valve prolapse 11/23/2015  . High cholesterol 11/23/2015   Past Medical History:  Diagnosis Date  . Arthritis   . Depression   . Endometriosis   . High cholesterol   . IBS (irritable bowel syndrome)   . Mitral valve prolapse   . Uterus, adenomyosis     Family History  Problem Relation Age of Onset  . Colon cancer Other   . Breast cancer Sister     Past Surgical History:  Procedure Laterality Date  . COLONOSCOPY    . COLONOSCOPY N/A 12/13/2015   Procedure: COLONOSCOPY;  Surgeon: Rogene Houston, MD;  [facility];  Service: Endoscopy;  Laterality: N/A;  9:66  . Fatty tumor     2011 (left arm)  . TONSILLECTOMY    . TOTAL ABDOMINAL HYSTERECTOMY     2003   Social History   Occupational History  . Not on file  Tobacco Use  . Smoking status: Former Smoker    Packs/day: 0.50    Years: 33.00    Pack years: 16.50    Types: Cigarettes  . Smokeless tobacco: Never Used  . Tobacco comment: quit 3 yrs ago  Substance and Sexual Activity  . Alcohol use: No    Alcohol/week: 0.0 standard drinks  . Drug use: No  . Sexual activity: Not on file

## 2019-05-17 ENCOUNTER — Telehealth: Payer: Self-pay | Admitting: Family Medicine

## 2019-05-17 NOTE — Telephone Encounter (Signed)
Blood glucose borderline at 103, but not sure if this was fasting.  If non-fasting, then it's ok.  Thyroid studies look normal.  Rheumatoid factor elevated at 39.    All else looks good.

## 2019-05-19 LAB — CBC WITH DIFFERENTIAL/PLATELET
Absolute Monocytes: 555 cells/uL (ref 200–950)
Basophils Absolute: 59 cells/uL (ref 0–200)
Basophils Relative: 0.5 %
Eosinophils Absolute: 189 cells/uL (ref 15–500)
Eosinophils Relative: 1.6 %
HCT: 37.1 % (ref 35.0–45.0)
Hemoglobin: 12.4 g/dL (ref 11.7–15.5)
Lymphs Abs: 2785 cells/uL (ref 850–3900)
MCH: 28.9 pg (ref 27.0–33.0)
MCHC: 33.4 g/dL (ref 32.0–36.0)
MCV: 86.5 fL (ref 80.0–100.0)
MPV: 10.4 fL (ref 7.5–12.5)
Monocytes Relative: 4.7 %
Neutro Abs: 8213 cells/uL — ABNORMAL HIGH (ref 1500–7800)
Neutrophils Relative %: 69.6 %
Platelets: 345 10*3/uL (ref 140–400)
RBC: 4.29 10*6/uL (ref 3.80–5.10)
RDW: 14.1 % (ref 11.0–15.0)
Total Lymphocyte: 23.6 %
WBC: 11.8 10*3/uL — ABNORMAL HIGH (ref 3.8–10.8)

## 2019-05-19 LAB — COMPREHENSIVE METABOLIC PANEL
AG Ratio: 1.4 (calc) (ref 1.0–2.5)
ALT: 9 U/L (ref 6–29)
AST: 16 U/L (ref 10–35)
Albumin: 4.1 g/dL (ref 3.6–5.1)
Alkaline phosphatase (APISO): 155 U/L — ABNORMAL HIGH (ref 37–153)
BUN: 10 mg/dL (ref 7–25)
CO2: 26 mmol/L (ref 20–32)
Calcium: 9.6 mg/dL (ref 8.6–10.4)
Chloride: 104 mmol/L (ref 98–110)
Creat: 0.68 mg/dL (ref 0.50–0.99)
Globulin: 3 g/dL (calc) (ref 1.9–3.7)
Glucose, Bld: 103 mg/dL — ABNORMAL HIGH (ref 65–99)
Potassium: 4.2 mmol/L (ref 3.5–5.3)
Sodium: 140 mmol/L (ref 135–146)
Total Bilirubin: 0.3 mg/dL (ref 0.2–1.2)
Total Protein: 7.1 g/dL (ref 6.1–8.1)

## 2019-05-19 LAB — ANA: Anti Nuclear Antibody (ANA): NEGATIVE

## 2019-05-19 LAB — THYROID PANEL WITH TSH
Free Thyroxine Index: 2.7 (ref 1.4–3.8)
T3 Uptake: 27 % (ref 22–35)
T4, Total: 10.1 ug/dL (ref 5.1–11.9)
TSH: 2.34 mIU/L (ref 0.40–4.50)

## 2019-05-19 LAB — VITAMIN D 25 HYDROXY (VIT D DEFICIENCY, FRACTURES): Vit D, 25-Hydroxy: 56 ng/mL (ref 30–100)

## 2019-05-19 LAB — IRON,TIBC AND FERRITIN PANEL
%SAT: 21 % (calc) (ref 16–45)
Ferritin: 22 ng/mL (ref 16–288)
Iron: 62 ug/dL (ref 45–160)
TIBC: 302 mcg/dL (calc) (ref 250–450)

## 2019-05-19 LAB — C-REACTIVE PROTEIN: CRP: 2 mg/L (ref <8.0)

## 2019-05-19 LAB — THYROID PEROXIDASE ANTIBODY: Thyroperoxidase Ab SerPl-aCnc: 1 IU/mL (ref <9)

## 2019-05-19 LAB — RHEUMATOID FACTOR: Rheumatoid fact SerPl-aCnc: 39 IU/mL — ABNORMAL HIGH (ref <14)

## 2019-06-25 ENCOUNTER — Other Ambulatory Visit: Payer: Self-pay | Admitting: Family Medicine

## 2019-10-17 ENCOUNTER — Ambulatory Visit: Payer: BC Managed Care – PPO | Admitting: Family Medicine

## 2019-10-21 ENCOUNTER — Encounter: Payer: Self-pay | Admitting: Family Medicine

## 2019-10-21 ENCOUNTER — Ambulatory Visit: Payer: BC Managed Care – PPO | Admitting: Family Medicine

## 2019-10-21 ENCOUNTER — Ambulatory Visit: Payer: Self-pay

## 2019-10-21 ENCOUNTER — Other Ambulatory Visit: Payer: Self-pay

## 2019-10-21 ENCOUNTER — Telehealth: Payer: Self-pay | Admitting: Family Medicine

## 2019-10-21 DIAGNOSIS — M0579 Rheumatoid arthritis with rheumatoid factor of multiple sites without organ or systems involvement: Secondary | ICD-10-CM

## 2019-10-21 DIAGNOSIS — M25531 Pain in right wrist: Secondary | ICD-10-CM | POA: Diagnosis not present

## 2019-10-21 DIAGNOSIS — G5762 Lesion of plantar nerve, left lower limb: Secondary | ICD-10-CM

## 2019-10-21 DIAGNOSIS — F5102 Adjustment insomnia: Secondary | ICD-10-CM | POA: Diagnosis not present

## 2019-10-21 DIAGNOSIS — M79601 Pain in right arm: Secondary | ICD-10-CM | POA: Diagnosis not present

## 2019-10-21 MED ORDER — MELOXICAM 15 MG PO TABS: 7.5000 mg | ORAL_TABLET | Freq: Every day | ORAL | 6 refills | Status: DC | PRN

## 2019-10-21 MED ORDER — GABAPENTIN 100 MG PO CAPS: ORAL_CAPSULE | ORAL | 3 refills | Status: DC

## 2019-10-21 NOTE — Progress Notes (Signed)
I saw and examined the patient with Dr. Mayer Masker and agree with assessment and plan as outlined.    Right wrist synovitis - injected today.  Right long head biceps tenosynovitis - also injected today.  Left foot possible neuroma, treated with Hapad metatarsal pad.  Try gabapentin for sleep.  Meloxicam as needed.

## 2019-10-21 NOTE — Telephone Encounter (Signed)
Before leaving the patient wanted to know if she could be scheduled for blood work   Call back: [phone]

## 2019-10-21 NOTE — Progress Notes (Signed)
Vanessa Lara - 64 y.o. female MRN [id number]  Date of birth: 10/26/1955  Office Visit Note: Visit Date: 10/21/2019 PCP: Sinda Du, MD Referred by: Sinda Du, MD  Subjective: Chief Complaint  Patient presents with  . Right Upper Arm - Pain    Worse pain in the upper arm and wrist since 08/2019.   . Right Wrist - Pain   HPI: Vanessa Lara is a 64 y.o. female with RA who comes in today with right upper arm pain, right foot pain, and right wrist pain.   Upper arm pain- present for the past 2 months. No acute injury. Hurts mainly in bicep- difficulty lifting things (a coffee cup) due to pain.   Wrist pain- swelling and stiffness over radial aspect of wrist. Had injection in August that helped.   Right foot pain- numbness and tingling with pain between 3rd and 4th toes. Present for the past several months. 4th toe is starting to be a hammer toe.   Has been using trazodone for sleeping and is feeling foggy for 4-5 hours after waking up.  Would like to try meloxicam again for RA. Did not like the side effects of infusions (IBS flare, cognitive side effects).  ROS Otherwise per HPI.  Assessment & Plan: Visit Diagnoses:  1. Right arm pain   2. Pain in right wrist   3. Adjustment insomnia   4. Rheumatoid arthritis with rheumatoid factor of multiple sites without organ or systems involvement ([place])     Plan:  1. Right wrist pain- elected for corticosteroid injection with improvement in pain 2. Right arm pain- biceps tenosynovitis secondary to RA. Tendon sheath was injected with corticosteroid  3. Insomnia- instructed her to stop trazodone as it was given morning somnolence. Instead will trial gabapentin as this will help with neuropathic pain from neuroma and arthritis as well as sleep aid 4. Morton's neuroma- instructed her to purchase neuroma pads at [email or web address]  Meds & Orders:  Meds ordered this encounter  Medications  . gabapentin (NEURONTIN) 100 MG capsule    Sig:  1 PO q HS, may increase to 1 PO TID if needed    Dispense:  90 capsule    Refill:  3  . meloxicam (MOBIC) 15 MG tablet    Sig: Take 0.5-1 tablets (7.5-15 mg total) by mouth daily as needed for pain.    Dispense:  30 tablet    Refill:  6    Orders Placed This Encounter  Procedures  . US Guided Needle Placement    Follow-up: No follow-ups on file.   Procedures: Procedure performed: left wrist injection, US guided  Consent obtained and verified. Time-out conducted. Noted no overlying erythema, induration, or other signs of local infection. The left radial wrist joint was identified with ultrasound.  The overlying skin was prepped in a sterile fashion. Topical analgesic spray: Ethyl chloride. Needle: 25-gauge, 1.5 inch Using an in plane approach the needle was advanced into the tendon sheath and medications were injected.  Completed without difficulty. Meds: 4 cc lidocaine, 40 mg methylprednisolone  Also injected biceps tendon in similar fashion under ultrasound.   Clinical History: No specialty comments available.   She reports that she has quit smoking. Her smoking use included cigarettes. She has a 16.50 pack-year smoking history. She has never used smokeless tobacco. No results for input(s): HGBA1C, LABURIC in the last 8760 hours.  Objective:  VS:  HT:    WT:   BMI:     BP:  HR: bpm  TEMP: ( )  RESP:  Physical Exam  PHYSICAL EXAM: Gen: NAD, alert, cooperative with exam, well-appearing HEENT: clear conjunctiva,  CV:  no edema, capillary refill brisk, normal rate Resp: non-labored Skin: no rashes, normal turgor  Neuro: no gross deficits.  Psych:  alert and oriented  Left arm: TTP at biceps musculotendinous junction. Pain with speeds. Negative yergason.  Left wrist: Dorsal and volar swelling in the radial part of of the wrist with no erythema or warmth.  Left foot: TTP between 3rd and 4th MTPs  Imaging: US Guided Needle Placement  Result Date: 10/21/2019 Please  see Notes tab for imaging impression.   Past Medical/Family/Surgical/Social History: Medications & Allergies reviewed per EMR, new medications updated. Patient Active Problem List   Diagnosis Date Noted  . Primary osteoarthritis involving multiple joints 12/31/2018  . Rheumatoid arthritis involving multiple sites with positive rheumatoid factor ([place]) 12/31/2018  . Irritable bowel syndrome with both constipation and diarrhea 10/28/2018  . Constipation 03/14/2016  . Mitral valve prolapse 11/23/2015  . High cholesterol 11/23/2015   Past Medical History:  Diagnosis Date  . Arthritis   . Depression   . Endometriosis   . High cholesterol   . IBS (irritable bowel syndrome)   . Mitral valve prolapse   . Uterus, adenomyosis    Family History  Problem Relation Age of Onset  . Colon cancer Other   . Breast cancer Sister    Past Surgical History:  Procedure Laterality Date  . COLONOSCOPY    . COLONOSCOPY N/A 12/13/2015   Procedure: COLONOSCOPY;  Surgeon: Rogene Houston, MD;  [facility];  Service: Endoscopy;  Laterality: N/A;  9:55  . Fatty tumor     2011 (left arm)  . TONSILLECTOMY    . TOTAL ABDOMINAL HYSTERECTOMY     2003   Social History   Occupational History  . Not on file  Tobacco Use  . Smoking status: Former Smoker    Packs/day: 0.50    Years: 33.00    Pack years: 16.50    Types: Cigarettes  . Smokeless tobacco: Never Used  . Tobacco comment: quit 3 yrs ago  Substance and Sexual Activity  . Alcohol use: No    Alcohol/week: 0.0 standard drinks  . Drug use: No  . Sexual activity: Not on file

## 2019-10-22 ENCOUNTER — Other Ambulatory Visit: Payer: Self-pay | Admitting: Family Medicine

## 2019-10-22 DIAGNOSIS — R739 Hyperglycemia, unspecified: Secondary | ICD-10-CM

## 2019-10-22 DIAGNOSIS — M0579 Rheumatoid arthritis with rheumatoid factor of multiple sites without organ or systems involvement: Secondary | ICD-10-CM

## 2019-10-22 NOTE — Telephone Encounter (Signed)
I tried calling the patient to schedule the blood work appointment with me - phone is not in service. No other number in the chart for the patient. Will try her again later.

## 2019-10-22 NOTE — Telephone Encounter (Signed)
Orders placed.

## 2019-10-22 NOTE — Telephone Encounter (Signed)
Did you order any bloodwork for her?

## 2019-10-23 NOTE — Telephone Encounter (Signed)
I tried her phone again - the number is still not in service. I will hold this message for now, but also try to reach the patient through [facility].

## 2019-10-24 NOTE — Telephone Encounter (Signed)
Her phone number is still not in service and she has not responded to the [employer]. Filing this message for now - will address it when/if she calls back.

## 2019-11-04 ENCOUNTER — Other Ambulatory Visit: Payer: Self-pay | Admitting: Family Medicine

## 2019-11-04 ENCOUNTER — Other Ambulatory Visit: Payer: Self-pay

## 2019-11-04 ENCOUNTER — Telehealth: Payer: Self-pay | Admitting: Family Medicine

## 2019-11-04 DIAGNOSIS — M0579 Rheumatoid arthritis with rheumatoid factor of multiple sites without organ or systems involvement: Secondary | ICD-10-CM

## 2019-11-04 DIAGNOSIS — M25511 Pain in right shoulder: Secondary | ICD-10-CM

## 2019-11-04 DIAGNOSIS — R739 Hyperglycemia, unspecified: Secondary | ICD-10-CM

## 2019-11-04 NOTE — Telephone Encounter (Signed)
I called and spoke with the patient: her right upper arm felt better the day of the biceps tendon injection and the next day. Pain returned after that. Hurts to do much with that arm (it is her dominant arm). What is the next step for her? She will be going to [facility] in [place] to get the most recent labwork that was ordered (RF, BMET & Hgb A1C).

## 2019-11-04 NOTE — Addendum Note (Signed)
Addended by: Marlyne Beards on: 11/04/2019 04:28 PM   Modules accepted: Orders

## 2019-11-04 NOTE — Telephone Encounter (Signed)
I called the patient - see other message from today.

## 2019-11-04 NOTE — Telephone Encounter (Signed)
Referral made to PT at [facility].

## 2019-11-04 NOTE — Telephone Encounter (Signed)
Patient called and stated left message  on My Chart and can't get to message ;also wanted to you to know her arm is still in pain.  Please call patient [phone]

## 2019-11-04 NOTE — Telephone Encounter (Signed)
I tried calling the patient back - the number is not in service. Will try again later.

## 2019-11-04 NOTE — Telephone Encounter (Signed)
Patient called.   I saw her in MyChart messages that you have been trying to reach her to schedule a blood draw but there was an error with the number.   We concluded that the number we had on file for her is incorrect. I have now updated it and you should be able to reach her.   Call back number: [phone]

## 2019-11-04 NOTE — Telephone Encounter (Signed)
I called and advised the patient of the plan. I gave her the phone number so she may call and schedule it, herself. Will fax the blood work order to [facility] in [place] - she plans to go in the morning to have this done.

## 2019-11-05 ENCOUNTER — Other Ambulatory Visit: Payer: Self-pay | Admitting: Family Medicine

## 2019-11-05 DIAGNOSIS — M25531 Pain in right wrist: Secondary | ICD-10-CM

## 2019-11-05 DIAGNOSIS — M25511 Pain in right shoulder: Secondary | ICD-10-CM

## 2019-11-05 DIAGNOSIS — M79601 Pain in right arm: Secondary | ICD-10-CM

## 2019-11-06 DIAGNOSIS — M0579 Rheumatoid arthritis with rheumatoid factor of multiple sites without organ or systems involvement: Secondary | ICD-10-CM | POA: Diagnosis not present

## 2019-11-06 DIAGNOSIS — R739 Hyperglycemia, unspecified: Secondary | ICD-10-CM | POA: Diagnosis not present

## 2019-11-07 ENCOUNTER — Other Ambulatory Visit: Payer: Self-pay

## 2019-11-07 ENCOUNTER — Ambulatory Visit (HOSPITAL_COMMUNITY): Payer: BC Managed Care – PPO | Attending: Family Medicine | Admitting: Occupational Therapy

## 2019-11-07 ENCOUNTER — Encounter (HOSPITAL_COMMUNITY): Payer: Self-pay | Admitting: Occupational Therapy

## 2019-11-07 ENCOUNTER — Telehealth: Payer: Self-pay | Admitting: Family Medicine

## 2019-11-07 DIAGNOSIS — M25511 Pain in right shoulder: Secondary | ICD-10-CM | POA: Diagnosis not present

## 2019-11-07 DIAGNOSIS — R29898 Other symptoms and signs involving the musculoskeletal system: Secondary | ICD-10-CM | POA: Insufficient documentation

## 2019-11-07 LAB — BASIC METABOLIC PANEL
BUN/Creatinine Ratio: 13 (ref 12–28)
BUN: 10 mg/dL (ref 8–27)
CO2: 25 mmol/L (ref 20–29)
Calcium: 9.4 mg/dL (ref 8.7–10.3)
Chloride: 101 mmol/L (ref 96–106)
Creatinine, Ser: 0.79 mg/dL (ref 0.57–1.00)
GFR calc Af Amer: 92 mL/min/{1.73_m2} (ref >59)
GFR calc non Af Amer: 80 mL/min/{1.73_m2} (ref >59)
Glucose: 121 mg/dL — ABNORMAL HIGH (ref 65–99)
Potassium: 3.7 mmol/L (ref 3.5–5.2)
Sodium: 141 mmol/L (ref 134–144)

## 2019-11-07 LAB — RHEUMATOID FACTOR: Rheumatoid fact SerPl-aCnc: 39.7 IU/mL — ABNORMAL HIGH (ref 0.0–13.9)

## 2019-11-07 LAB — HEMOGLOBIN A1C
Est. average glucose Bld gHb Est-mCnc: 111 mg/dL
Hgb A1c MFr Bld: 5.5 % (ref 4.8–5.6)

## 2019-11-07 NOTE — Telephone Encounter (Signed)
Labs are stable.  Glucose is up, but A1C still in normal range.

## 2019-11-07 NOTE — Patient Instructions (Signed)
  1) Flexion Wall Stretch ° ° ° °Face wall, place affected handon wall in front of you. Slide hand up the wall  °and lean body in towards the wall. Hold for 10 seconds. Repeat 3-5 times. 1-2 times/day.  ° ° ° °2) Towel Stretch with Internal Rotation °  Or    ° °Gently pull up (or to the side) your affected arm  behind your back with the assist of a towel. Hold 10 seconds, repeat 3-5 times. 1-2 times/day.  ° ° ° ° ° ° ° ° ° ° ° °3) Corner Stretch  ° ° °Stand at a corner of a wall, place your arms on the walls with elbows bent. Lean into the corner until a stretch is felt along the front of your chest and/or shoulders. Hold for 10 seconds. Repeat 3-5X, 1-2 times/day.  ° ° °4) Posterior Capsule Stretch ° ° ° °Bring the involved arm across chest. Grasp elbow and pull toward chest until you feel a stretch in the back of the upper arm and shoulder. Hold 10 seconds. Repeat 3-5X. Complete 1-2 times/day.  ° ° °5) Scapular Retraction ° ° ° °Tuck chin back as you pinch shoulder blades together.  Hold 5 seconds. Repeat 3-5X. Complete 1-2 times/day.  ° ° ° ° ° ° °

## 2019-11-07 NOTE — Therapy (Signed)
[place], [place], [address] Phone: [phone]   Fax:  [phone]  Occupational Therapy Evaluation  Patient Details  Name: Vanessa Lara MRN: [id number] Date of Birth: 08/09/1956 Referring Provider (OT): Dr. Eunice Blase   Encounter Date: 11/07/2019  OT End of Session - 11/07/19 1415    Visit Number  1    Number of Visits  8    Date for OT Re-Evaluation  12/07/19    Authorization Type  BCBS    OT Start Time  1310    OT Stop Time  1353    OT Time Calculation (min)  43 min    Activity Tolerance  Patient tolerated treatment well    Behavior During Therapy  [facility] for tasks assessed/performed       Past Medical History:  Diagnosis Date  . Arthritis   . Depression   . Endometriosis   . High cholesterol   . IBS (irritable bowel syndrome)   . Mitral valve prolapse   . Uterus, adenomyosis     Past Surgical History:  Procedure Laterality Date  . COLONOSCOPY    . COLONOSCOPY N/A 12/13/2015   Procedure: COLONOSCOPY;  Surgeon: Rogene Houston, MD;  [facility];  Service: Endoscopy;  Laterality: N/A;  9:55  . Fatty tumor     2011 (left arm)  . TONSILLECTOMY    . TOTAL ABDOMINAL HYSTERECTOMY     2003    There were no vitals filed for this visit.  Subjective Assessment - 11/07/19 1411    Subjective   S: My shoulder and bicep are really hurting.    Pertinent History  Pt is a 64 y/o female presenting with RUE pain-reports currently localized to shoulder and upper arm. Pt reports pain began in November, no known reason for pain. Did have injection at last MD appt that lasted for approximately a day and a half. Dr. Eunice Blase referred pt to occupational therapy for evaluation and treatment.    Limitations  Pt has rheumatoid arthritis, hx of right wrist pain    Special Tests  FOTO: 34/100    Patient Stated Goals  To be able to use my arm with less pain    Currently in Pain?  Yes    Pain Score  5      Pain Location  Shoulder    Pain Orientation  Right    Pain Descriptors / Indicators  Aching;Sore    Pain Type  Acute pain    Pain Radiating Towards  elbow    Pain Onset  More than a month ago    Pain Frequency  Constant    Aggravating Factors   movement, use    Pain Relieving Factors  rest    Effect of Pain on Daily Activities  mod effect on ADL completion    Multiple Pain Sites  No        [facility] OT Assessment - 11/07/19 1310      Assessment   Medical Diagnosis  right shoulder pain    Referring Provider (OT)  Dr. Legrand Como Hilts    Onset Date/Surgical Date  08/03/19    Hand Dominance  Right    Next MD Visit  none scheduled at this time    Prior Therapy  None      Precautions   Precautions  None      Restrictions   Weight Bearing Restrictions  No      Balance Screen  Has the patient fallen in the past 6 months  No      Prior Function   Level of Independence  Independent    Leisure  gardening, taking care of the house, crafting      ADL   ADL comments  Pt is having difficulty with dressing, bathing, fixing her hair, opening doors, driving, reaching up and behind back. Pt has difficulty lifting bags of groceries.       Written Expression   Dominant Hand  Right      Cognition   Overall Cognitive Status  Within Functional Limits for tasks assessed      Observation/Other Assessments   Focus on Therapeutic Outcomes (FOTO)   34/100      ROM / Strength   AROM / PROM / Strength  AROM;PROM;Strength      Palpation   Palpation comment  mod to max fascial restrictions in upper arm, anterior shoulder, trapezius, and scapular regions      AROM   Overall AROM Comments  Assessed seated, er/IR adducted    AROM Assessment Site  Shoulder    Right/Left Shoulder  Right    Right Shoulder Flexion  118 Degrees    Right Shoulder ABduction  97 Degrees    Right Shoulder Internal Rotation  90 Degrees    Right Shoulder External Rotation  90 Degrees      PROM   Overall PROM Comments   Assessed supine, er/IR adducted    PROM Assessment Site  Shoulder    Right/Left Shoulder  Right    Right Shoulder Flexion  161 Degrees    Right Shoulder ABduction  120 Degrees    Right Shoulder Internal Rotation  90 Degrees    Right Shoulder External Rotation  90 Degrees      Strength   Overall Strength Comments  Assessed seated, er/IR adducted    Strength Assessment Site  Shoulder    Right/Left Shoulder  Right    Right Shoulder Flexion  4/5    Right Shoulder ABduction  4-/5    Right Shoulder Internal Rotation  5/5    Right Shoulder External Rotation  4-/5                      OT Education - 11/07/19 1336    Education Details  shoulder stretches    Person(s) Educated  Patient    Methods  Explanation;Demonstration;Handout    Comprehension  Verbalized understanding;Returned demonstration       OT Short Term Goals - 11/07/19 1417      OT SHORT TERM GOAL #1   Title  Pt will be provided with and educated on HEP to improve mobility required for use of RUE as dominant during ADLs.    Time  4    Period  Weeks    Status  New    Target Date  12/07/19      OT SHORT TERM GOAL #2   Title  Pt will increase RUE A/ROM to [facility] to improve ability to reach overhead and behind back during dressing tasks.    Time  4    Period  Weeks    Status  New      OT SHORT TERM GOAL #3   Title  Pt will decrease RUE fascial restrictions to minimal amounts or less to improve mobility required for functional reaching tasks.    Time  4    Period  Weeks    Status  New  OT SHORT TERM GOAL #4   Title  Pt will decrease RUE pain to 4/10 or less to improve ability to use pull covers over her shoulder when trying to sleep.    Time  4    Period  Weeks    Status  New      OT SHORT TERM GOAL #5   Title  Pt will increase RUE strength to 4+/5 or greater to improve ability to carry grocery bags and perform gardening tasks.    Time  4    Period  Weeks    Status  New                Plan - 11/07/19 1415    Clinical Impression Statement  A: Pt is a 64 y/o female presenting with right shoulder pain, present since November limiting ability to complete ADLs using RUE. Pt reports pain as feeling deep, mostly localized to anterior shoulder and bicep regions. Educated on HEP and pt completing stretches with good form.    OT Occupational Profile and History  Problem Focused Assessment - Including review of records relating to presenting problem    Occupational performance deficits (Please refer to evaluation for details):  ADL's;IADL's;Leisure    Body Structure / Function / Physical Skills  ADL;Endurance;UE functional use;Muscle spasms;Fascial restriction;Pain;ROM;IADL;Strength    Rehab Potential  Good    Clinical Decision Making  Limited treatment options, no task modification necessary    Comorbidities Affecting Occupational Performance:  None    Modification or Assistance to Complete Evaluation   No modification of tasks or assist necessary to complete eval    OT Frequency  2x / week    OT Duration  4 weeks    OT Treatment/Interventions  Self-care/ADL training;Ultrasound;Patient/family education;Passive range of motion;Cryotherapy;Electrical Stimulation;Moist Heat;Therapeutic exercise;Manual Therapy;Therapeutic activities    Plan  P: Pt will benefit from skilled OT services to decrease pain and fascial restrictions, increase joint ROM, strength, and functional use of RUE during ADLs. Treatment plan: Myofascial release, manual therapy, P/ROM, A/ROM, general RUE strengthening, scapular stability and strengthening, modalities prn    Consulted and Agree with Plan of Care  Patient       Patient will benefit from skilled therapeutic intervention in order to improve the following deficits and impairments:   Body Structure / Function / Physical Skills: ADL, Endurance, UE functional use, Muscle spasms, Fascial restriction, Pain, ROM, IADL, Strength       Visit  Diagnosis: Acute pain of right shoulder  Other symptoms and signs involving the musculoskeletal system    Problem List Patient Active Problem List   Diagnosis Date Noted  . Primary osteoarthritis involving multiple joints 12/31/2018  . Rheumatoid arthritis involving multiple sites with positive rheumatoid factor ([place]) 12/31/2018  . Irritable bowel syndrome with both constipation and diarrhea 10/28/2018  . Constipation 03/14/2016  . Mitral valve prolapse 11/23/2015  . High cholesterol 11/23/2015   Guadelupe Sabin, OTR/L  [phone] 11/07/2019, 2:20 PM  [facility] [address] [place], [place], [address] Phone: [phone]   Fax:  [phone]  Name: Vanessa Lara MRN: [id number] Date of Birth: May 17, 1956

## 2019-11-10 ENCOUNTER — Telehealth: Payer: Self-pay | Admitting: Family Medicine

## 2019-11-10 MED ORDER — TRAMADOL HCL 50 MG PO TABS: 50.0000 mg | ORAL_TABLET | Freq: Four times a day (QID) | ORAL | 0 refills | Status: DC | PRN

## 2019-11-10 NOTE — Telephone Encounter (Signed)
Please advise 

## 2019-11-10 NOTE — Addendum Note (Signed)
Addended by: Hortencia Pilar on: 11/10/2019 03:37 PM   Modules accepted: Orders

## 2019-11-10 NOTE — Telephone Encounter (Signed)
Tramadol sent. 

## 2019-11-10 NOTE — Telephone Encounter (Signed)
Patient called.   She is requesting she be prescribed something for pain. She doesn't want any codone of any kind.   Call back number: [phone]

## 2019-11-10 NOTE — Telephone Encounter (Signed)
I advised the patient Tramadol was sent in to her pharmacy.

## 2019-11-11 ENCOUNTER — Other Ambulatory Visit: Payer: Self-pay

## 2019-11-11 ENCOUNTER — Ambulatory Visit (HOSPITAL_COMMUNITY): Payer: BC Managed Care – PPO | Admitting: Occupational Therapy

## 2019-11-11 DIAGNOSIS — M25511 Pain in right shoulder: Secondary | ICD-10-CM

## 2019-11-11 DIAGNOSIS — R29898 Other symptoms and signs involving the musculoskeletal system: Secondary | ICD-10-CM | POA: Diagnosis not present

## 2019-11-11 NOTE — Therapy (Signed)
[place], [place], [address] Phone: [phone]   Fax:  [phone]  Occupational Therapy Treatment  Patient Details  Name: Vanessa Lara MRN: [id number] Date of Birth: 28-Oct-1955 Referring Provider (OT): Dr. Eunice Blase   Encounter Date: 11/11/2019  OT End of Session - 11/11/19 1038    Visit Number  2    Number of Visits  8    Date for OT Re-Evaluation  12/07/19    Authorization Type  BCBS    OT Start Time  0904    OT Stop Time  0944    OT Time Calculation (min)  40 min    Activity Tolerance  Patient tolerated treatment well    Behavior During Therapy  [facility] for tasks assessed/performed       Past Medical History:  Diagnosis Date  . Arthritis   . Depression   . Endometriosis   . High cholesterol   . IBS (irritable bowel syndrome)   . Mitral valve prolapse   . Uterus, adenomyosis     Past Surgical History:  Procedure Laterality Date  . COLONOSCOPY    . COLONOSCOPY N/A 12/13/2015   Procedure: COLONOSCOPY;  Surgeon: Rogene Houston, MD;  [facility];  Service: Endoscopy;  Laterality: N/A;  9:55  . Fatty tumor     2011 (left arm)  . TONSILLECTOMY    . TOTAL ABDOMINAL HYSTERECTOMY     2003    There were no vitals filed for this visit.  Subjective Assessment - 11/11/19 0905    Subjective   S: I tried to do my exercises on Saturday and my arm started screaming.    Currently in Pain?  Yes    Pain Score  4     Pain Location  Shoulder    Pain Orientation  Right    Pain Descriptors / Indicators  Aching;Sore    Pain Type  Acute pain    Pain Radiating Towards  elbow    Pain Onset  In the past 7 days    Pain Frequency  Constant    Aggravating Factors   movement, use    Pain Relieving Factors  rest    Effect of Pain on Daily Activities  mod effect on ADL completion    Multiple Pain Sites  No         [facility] OT Assessment - 11/11/19 0903      Assessment   Medical Diagnosis  right  shoulder pain      Precautions   Precautions  None               OT Treatments/Exercises (OP) - 11/11/19 0906      Exercises   Exercises  Shoulder      Shoulder Exercises: Supine   Protraction  PROM;5 reps;AROM;10 reps    Horizontal ABduction  PROM;5 reps;AROM;10 reps    External Rotation  PROM;5 reps;AROM;10 reps    Internal Rotation  PROM;5 reps;AROM;10 reps    Flexion  PROM;5 reps;AROM;10 reps    ABduction  PROM;5 reps;AROM;10 reps      Shoulder Exercises: Seated   Protraction  AROM;5 reps    Horizontal ABduction  AROM;5 reps    Flexion  AROM;5 reps      Shoulder Exercises: Pulleys   Flexion  1 minute    ABduction  1 minute      Manual Therapy   Manual Therapy  Myofascial release    Manual therapy  comments  completed separately from therapeutic exercises    Myofascial Release  myofascial release and manual techniques to right upper arm, anterior shoulder, trapezius, and scapular regions to decrease pain and fascial restrictions               OT Short Term Goals - 11/11/19 0927      OT SHORT TERM GOAL #1   Title  Pt will be provided with and educated on HEP to improve mobility required for use of RUE as dominant during ADLs.    Time  4    Period  Weeks    Status  On-going    Target Date  12/07/19      OT SHORT TERM GOAL #2   Title  Pt will increase RUE A/ROM to [facility] to improve ability to reach overhead and behind back during dressing tasks.    Time  4    Period  Weeks    Status  On-going      OT SHORT TERM GOAL #3   Title  Pt will decrease RUE fascial restrictions to minimal amounts or less to improve mobility required for functional reaching tasks.    Time  4    Period  Weeks    Status  On-going      OT SHORT TERM GOAL #4   Title  Pt will decrease RUE pain to 4/10 or less to improve ability to use pull covers over her shoulder when trying to sleep.    Time  4    Period  Weeks    Status  On-going      OT SHORT TERM GOAL #5   Title  Pt  will increase RUE strength to 4+/5 or greater to improve ability to carry grocery bags and perform gardening tasks.    Time  4    Period  Weeks    Status  On-going               Plan - 11/11/19 0933    Clinical Impression Statement  A: Pt reports increased pain since Saturday after completing stretches, improved yesterday. Initiated myofascial release, passive stretching, and A/ROM exercises this session. Pt with good ROM passive and active reaching WNL today. Rest breaks taken as needed, verbal cuing for form and technqiue.    Body Structure / Function / Physical Skills  ADL;Endurance;UE functional use;Muscle spasms;Fascial restriction;Pain;ROM;IADL;Strength    Plan  P: Continue with A/ROM, TENS if pt continuing to have high pain level       Patient will benefit from skilled therapeutic intervention in order to improve the following deficits and impairments:   Body Structure / Function / Physical Skills: ADL, Endurance, UE functional use, Muscle spasms, Fascial restriction, Pain, ROM, IADL, Strength       Visit Diagnosis: Acute pain of right shoulder  Other symptoms and signs involving the musculoskeletal system    Problem List Patient Active Problem List   Diagnosis Date Noted  . Primary osteoarthritis involving multiple joints 12/31/2018  . Rheumatoid arthritis involving multiple sites with positive rheumatoid factor ([place]) 12/31/2018  . Irritable bowel syndrome with both constipation and diarrhea 10/28/2018  . Constipation 03/14/2016  . Mitral valve prolapse 11/23/2015  . High cholesterol 11/23/2015   Guadelupe Sabin, OTR/L  [phone] 11/11/2019, 10:41 AM  [facility] [address] [place], [place], [address] Phone: [phone]   Fax:  [phone]  Name: Vanessa Lara MRN: [id number] Date of Birth: September 09, 1956

## 2019-11-14 ENCOUNTER — Encounter (HOSPITAL_COMMUNITY): Payer: Self-pay | Admitting: Occupational Therapy

## 2019-11-14 ENCOUNTER — Other Ambulatory Visit: Payer: Self-pay

## 2019-11-14 ENCOUNTER — Ambulatory Visit (HOSPITAL_COMMUNITY): Payer: BC Managed Care – PPO | Admitting: Occupational Therapy

## 2019-11-14 DIAGNOSIS — M25511 Pain in right shoulder: Secondary | ICD-10-CM

## 2019-11-14 DIAGNOSIS — R29898 Other symptoms and signs involving the musculoskeletal system: Secondary | ICD-10-CM

## 2019-11-14 NOTE — Patient Instructions (Signed)

## 2019-11-14 NOTE — Therapy (Signed)
[place], [place], [address] Phone: [phone]   Fax:  [phone]  Occupational Therapy Treatment  Patient Details  Name: Vanessa Lara MRN: [id number] Date of Birth: November 27, 1955 Referring Provider (OT): Dr. Eunice Blase   Encounter Date: 11/14/2019  OT End of Session - 11/14/19 1154    Visit Number  3    Number of Visits  8    Date for OT Re-Evaluation  12/07/19    Authorization Type  BCBS    OT Start Time  1116    OT Stop Time  1154    OT Time Calculation (min)  38 min    Activity Tolerance  Patient tolerated treatment well    Behavior During Therapy  [facility] for tasks assessed/performed       Past Medical History:  Diagnosis Date  . Arthritis   . Depression   . Endometriosis   . High cholesterol   . IBS (irritable bowel syndrome)   . Mitral valve prolapse   . Uterus, adenomyosis     Past Surgical History:  Procedure Laterality Date  . COLONOSCOPY    . COLONOSCOPY N/A 12/13/2015   Procedure: COLONOSCOPY;  Surgeon: Rogene Houston, MD;  [facility];  Service: Endoscopy;  Laterality: N/A;  9:55  . Fatty tumor     2011 (left arm)  . TONSILLECTOMY    . TOTAL ABDOMINAL HYSTERECTOMY     2003    There were no vitals filed for this visit.  Subjective Assessment - 11/14/19 1118    Subjective   S: It started hurting at 5:35 this morning.    Currently in Pain?  Yes    Pain Score  4     Pain Location  Shoulder    Pain Orientation  Right    Pain Descriptors / Indicators  Aching;Sore    Pain Type  Acute pain    Pain Radiating Towards  elbow    Pain Onset  In the past 7 days    Pain Frequency  Constant    Aggravating Factors   movement, use    Pain Relieving Factors  rest    Effect of Pain on Daily Activities  mod effect on ADL completion    Multiple Pain Sites  No         [facility] OT Assessment - 11/14/19 1118      Assessment   Medical Diagnosis  right shoulder pain      Precautions   Precautions  None               OT Treatments/Exercises (OP) - 11/14/19 1119      Exercises   Exercises  Shoulder      Shoulder Exercises: Supine   Protraction  PROM;5 reps;AROM;10 reps    Horizontal ABduction  PROM;5 reps;AROM;10 reps    External Rotation  PROM;5 reps;AROM;10 reps    Internal Rotation  PROM;5 reps;AROM;10 reps    Flexion  PROM;5 reps;AROM;10 reps    ABduction  PROM;5 reps;AROM;10 reps      Shoulder Exercises: Seated   Protraction  AROM;5 reps    Horizontal ABduction  AROM;10 reps    External Rotation  AROM;10 reps    Internal Rotation  AROM;10 reps    Flexion  AROM;10 reps    Abduction  AROM;10 reps      Shoulder Exercises: ROM/Strengthening   UBE (Upper Arm Bike)  Level 1, 2' reverse   pace: 2.0  Wall Wash  1'    Proximal Shoulder Strengthening, Supine  10X each, no rest breaks      Manual Therapy   Manual Therapy  Myofascial release    Manual therapy comments  completed separately from therapeutic exercises    Myofascial Release  myofascial release and manual techniques to right upper arm, anterior shoulder, trapezius, and scapular regions to decrease pain and fascial restrictions             OT Education - 11/14/19 1135    Education Details  A/ROM exercises    Person(s) Educated  Patient    Methods  Explanation;Demonstration;Handout    Comprehension  Verbalized understanding;Returned demonstration       OT Short Term Goals - 11/11/19 0927      OT SHORT TERM GOAL #1   Title  Pt will be provided with and educated on HEP to improve mobility required for use of RUE as dominant during ADLs.    Time  4    Period  Weeks    Status  On-going    Target Date  12/07/19      OT SHORT TERM GOAL #2   Title  Pt will increase RUE A/ROM to [facility] to improve ability to reach overhead and behind back during dressing tasks.    Time  4    Period  Weeks    Status  On-going      OT SHORT TERM GOAL #3   Title  Pt will decrease RUE  fascial restrictions to minimal amounts or less to improve mobility required for functional reaching tasks.    Time  4    Period  Weeks    Status  On-going      OT SHORT TERM GOAL #4   Title  Pt will decrease RUE pain to 4/10 or less to improve ability to use pull covers over her shoulder when trying to sleep.    Time  4    Period  Weeks    Status  On-going      OT SHORT TERM GOAL #5   Title  Pt will increase RUE strength to 4+/5 or greater to improve ability to carry grocery bags and perform gardening tasks.    Time  4    Period  Weeks    Status  On-going               Plan - 11/14/19 1135    Clinical Impression Statement  P: Continued with myofascial release, pt with moderate muscle knot at trapezius and along medial border of scapula. Continued with A/ROM exercises, ROM is WNL, pt reports more discomfort with er and abduction. Added 1' wall wash, proximal shoulder strengthening, and UBE in reverse. Verbal cuing for form and technique.    Body Structure / Function / Physical Skills  ADL;Endurance;UE functional use;Muscle spasms;Fascial restriction;Pain;ROM;IADL;Strength    Plan  P: Follow up on HEP, add x to v arms and scapular theraband if able to tolerate    OT Home Exercise Plan  2/5: shoulder stretches; 2/12: A/ROM exercises    Consulted and Agree with Plan of Care  Patient       Patient will benefit from skilled therapeutic intervention in order to improve the following deficits and impairments:   Body Structure / Function / Physical Skills: ADL, Endurance, UE functional use, Muscle spasms, Fascial restriction, Pain, ROM, IADL, Strength       Visit Diagnosis: Acute pain of right shoulder  Other symptoms and signs  involving the musculoskeletal system    Problem List Patient Active Problem List   Diagnosis Date Noted  . Primary osteoarthritis involving multiple joints 12/31/2018  . Rheumatoid arthritis involving multiple sites with positive rheumatoid factor  ([place]) 12/31/2018  . Irritable bowel syndrome with both constipation and diarrhea 10/28/2018  . Constipation 03/14/2016  . Mitral valve prolapse 11/23/2015  . High cholesterol 11/23/2015   Guadelupe Sabin, OTR/L  [phone] 11/14/2019, 11:56 AM  [facility] [address] [place], [place], [address] Phone: [phone]   Fax:  [phone]  Name: Vanessa Lara MRN: [id number] Date of Birth: 07/17/1956

## 2019-11-18 ENCOUNTER — Encounter (HOSPITAL_COMMUNITY): Payer: Self-pay | Admitting: Occupational Therapy

## 2019-11-18 ENCOUNTER — Other Ambulatory Visit: Payer: Self-pay

## 2019-11-18 ENCOUNTER — Ambulatory Visit (HOSPITAL_COMMUNITY): Payer: BC Managed Care – PPO | Admitting: Occupational Therapy

## 2019-11-18 DIAGNOSIS — R29898 Other symptoms and signs involving the musculoskeletal system: Secondary | ICD-10-CM | POA: Diagnosis not present

## 2019-11-18 DIAGNOSIS — M25511 Pain in right shoulder: Secondary | ICD-10-CM | POA: Diagnosis not present

## 2019-11-18 NOTE — Therapy (Signed)
Vanessa Lara, [place], [address] Phone: [phone]   Fax:  [phone]  Occupational Therapy Treatment  Patient Details  Name: Vanessa Lara MRN: [id number] Date of Birth: 1956/08/11 Referring Provider (OT): Dr. Eunice Blase   Encounter Date: 11/18/2019  OT End of Session - 11/18/19 1653    Visit Number  4    Number of Visits  8    Date for OT Re-Evaluation  12/07/19    Authorization Type  BCBS    OT Start Time  1522    OT Stop Time  1601    OT Time Calculation (min)  39 min    Activity Tolerance  Patient tolerated treatment well    Behavior During Therapy  [facility] for tasks assessed/performed       Past Medical History:  Diagnosis Date  . Arthritis   . Depression   . Endometriosis   . High cholesterol   . IBS (irritable bowel syndrome)   . Mitral valve prolapse   . Uterus, adenomyosis     Past Surgical History:  Procedure Laterality Date  . COLONOSCOPY    . COLONOSCOPY N/A 12/13/2015   Procedure: COLONOSCOPY;  Surgeon: Rogene Houston, MD;  [facility];  Service: Endoscopy;  Laterality: N/A;  9:55  . Fatty tumor     2011 (left arm)  . TONSILLECTOMY    . TOTAL ABDOMINAL HYSTERECTOMY     2003    There were no vitals filed for this visit.  Subjective Assessment - 11/18/19 1523    Subjective   S: It's just an achy feeling.    Currently in Pain?  Yes    Pain Score  3     Pain Location  Shoulder    Pain Orientation  Right    Pain Descriptors / Indicators  Aching;Sore    Pain Type  Acute pain    Pain Radiating Towards  elbow    Pain Onset  In the past 7 days    Pain Frequency  Constant    Aggravating Factors   movement, use    Pain Relieving Factors  rest    Effect of Pain on Daily Activities  mod effect on ADL completion    Multiple Pain Sites  No         [facility] OT Assessment - 11/18/19 1523      Assessment   Medical Diagnosis  right shoulder pain      Precautions   Precautions  None               OT Treatments/Exercises (OP) - 11/18/19 1524      Exercises   Exercises  Shoulder      Shoulder Exercises: Supine   Protraction  PROM;5 reps;AROM;12 reps    Horizontal ABduction  PROM;5 reps;AROM;12 reps    External Rotation  PROM;5 reps;AROM;12 reps    Internal Rotation  PROM;5 reps;AROM;12 reps    Flexion  PROM;5 reps;AROM;12 reps    ABduction  PROM;5 reps;AROM;12 reps      Shoulder Exercises: Seated   Extension  Theraband;10 reps    Theraband Level (Shoulder Extension)  Level 2 (Red)    Retraction  Theraband;10 reps    Theraband Level (Shoulder Retraction)  Level 2 (Red)    Row  Theraband;10 reps    Theraband Level (Shoulder Row)  Level 2 (Red)      Shoulder Exercises: ROM/Strengthening   UBE (Upper Arm Bike)  Level 1,  2' forward 2' reverse   pace: 4.0   Wall Wash  1'    X to V Arms  10X    Proximal Shoulder Strengthening, Supine  10X each, no rest breaks      Manual Therapy   Manual Therapy  Myofascial release    Manual therapy comments  completed separately from therapeutic exercises    Myofascial Release  myofascial release and manual techniques to right upper arm, anterior shoulder, trapezius, and scapular regions to decrease pain and fascial restrictions               OT Short Term Goals - 11/11/19 0927      OT SHORT TERM GOAL #1   Title  Pt will be provided with and educated on HEP to improve mobility required for use of RUE as dominant during ADLs.    Time  4    Period  Weeks    Status  On-going    Target Date  12/07/19      OT SHORT TERM GOAL #2   Title  Pt will increase RUE A/ROM to [facility] to improve ability to reach overhead and behind back during dressing tasks.    Time  4    Period  Weeks    Status  On-going      OT SHORT TERM GOAL #3   Title  Pt will decrease RUE fascial restrictions to minimal amounts or less to improve mobility required for functional reaching tasks.    Time  4    Period  Weeks     Status  On-going      OT SHORT TERM GOAL #4   Title  Pt will decrease RUE pain to 4/10 or less to improve ability to use pull covers over her shoulder when trying to sleep.    Time  4    Period  Weeks    Status  On-going      OT SHORT TERM GOAL #5   Title  Pt will increase RUE strength to 4+/5 or greater to improve ability to carry grocery bags and perform gardening tasks.    Time  4    Period  Weeks    Status  On-going               Plan - 11/18/19 1539    Clinical Impression Statement  A: Reports HEP is going well. Continued with manual techniques, pt with P/ROM WNL. Continued with A/ROM and added x to v arms, scapular theraband, and UBE today. Pt requiring verbal cuing for form and technique, occasional rest breaks for fatigue and discomfort. Pt reports forward UBE is more difficult than reverse. Verbal cuing for form and technique.    Body Structure / Function / Physical Skills  ADL;Endurance;UE functional use;Muscle spasms;Fascial restriction;Pain;ROM;IADL;Strength    Plan  P: discontinue supine A/ROM to allow for more time for standing exercises, continue with scapular theraband, add ball on wall    OT Home Exercise Plan  2/5: shoulder stretches; 2/12: A/ROM exercises       Patient will benefit from skilled therapeutic intervention in order to improve the following deficits and impairments:   Body Structure / Function / Physical Skills: ADL, Endurance, UE functional use, Muscle spasms, Fascial restriction, Pain, ROM, IADL, Strength       Visit Diagnosis: Acute pain of right shoulder  Other symptoms and signs involving the musculoskeletal system    Problem List Patient Active Problem List   Diagnosis Date Noted  .  Primary osteoarthritis involving multiple joints 12/31/2018  . Rheumatoid arthritis involving multiple sites with positive rheumatoid factor ([place]) 12/31/2018  . Irritable bowel syndrome with both constipation and diarrhea 10/28/2018  . Constipation  03/14/2016  . Mitral valve prolapse 11/23/2015  . High cholesterol 11/23/2015   Guadelupe Sabin, OTR/L  [phone] 11/18/2019, 4:55 PM  [name] [address] [place], [place], [address] Phone: [phone]   Fax:  [phone]  Name: Vanessa Lara MRN: [id number] Date of Birth: Jul 17, 1956

## 2019-11-21 ENCOUNTER — Encounter (HOSPITAL_COMMUNITY): Payer: Self-pay | Admitting: Occupational Therapy

## 2019-11-21 ENCOUNTER — Ambulatory Visit (HOSPITAL_COMMUNITY): Payer: BC Managed Care – PPO | Admitting: Occupational Therapy

## 2019-11-21 ENCOUNTER — Other Ambulatory Visit: Payer: Self-pay

## 2019-11-21 DIAGNOSIS — M9903 Segmental and somatic dysfunction of lumbar region: Secondary | ICD-10-CM | POA: Diagnosis not present

## 2019-11-21 DIAGNOSIS — R29898 Other symptoms and signs involving the musculoskeletal system: Secondary | ICD-10-CM | POA: Diagnosis not present

## 2019-11-21 DIAGNOSIS — M9902 Segmental and somatic dysfunction of thoracic region: Secondary | ICD-10-CM | POA: Diagnosis not present

## 2019-11-21 DIAGNOSIS — M545 Low back pain: Secondary | ICD-10-CM | POA: Diagnosis not present

## 2019-11-21 DIAGNOSIS — M25511 Pain in right shoulder: Secondary | ICD-10-CM | POA: Diagnosis not present

## 2019-11-21 DIAGNOSIS — M546 Pain in thoracic spine: Secondary | ICD-10-CM | POA: Diagnosis not present

## 2019-11-21 NOTE — Patient Instructions (Signed)

## 2019-11-21 NOTE — Therapy (Signed)
[place], [place], [address] Phone: [phone]   Fax:  [phone]  Occupational Therapy Treatment  Patient Details  Name: Vanessa Lara MRN: [id number] Date of Birth: 11-Oct-1955 Referring Provider (OT): Dr. Eunice Blase   Encounter Date: 11/21/2019  OT End of Session - 11/21/19 1350    Visit Number  5    Number of Visits  8    Date for OT Re-Evaluation  12/07/19    Authorization Type  BCBS    OT Start Time  1304    OT Stop Time  1346    OT Time Calculation (min)  42 min    Activity Tolerance  Patient tolerated treatment well    Behavior During Therapy  [facility] for tasks assessed/performed       Past Medical History:  Diagnosis Date  . Arthritis   . Depression   . Endometriosis   . High cholesterol   . IBS (irritable bowel syndrome)   . Mitral valve prolapse   . Uterus, adenomyosis     Past Surgical History:  Procedure Laterality Date  . COLONOSCOPY    . COLONOSCOPY N/A 12/13/2015   Procedure: COLONOSCOPY;  Surgeon: Rogene Houston, MD;  [facility];  Service: Endoscopy;  Laterality: N/A;  9:55  . Fatty tumor     2011 (left arm)  . TONSILLECTOMY    . TOTAL ABDOMINAL HYSTERECTOMY     2003    There were no vitals filed for this visit.  Subjective Assessment - 11/21/19 1303    Subjective   S: It was really hurting yesterday.    Currently in Pain?  No/denies         [facility] OT Assessment - 11/21/19 1303      Assessment   Medical Diagnosis  right shoulder pain      Precautions   Precautions  None               OT Treatments/Exercises (OP) - 11/21/19 1306      Exercises   Exercises  Shoulder      Shoulder Exercises: Supine   Protraction  PROM;5 reps    Horizontal ABduction  PROM;5 reps    External Rotation  PROM;5 reps    Internal Rotation  PROM;5 reps    Flexion  PROM;5 reps    ABduction  PROM;5 reps      Shoulder Exercises: Seated   Extension  Theraband;10  reps    Theraband Level (Shoulder Extension)  Level 2 (Red)    Retraction  Theraband;10 reps    Theraband Level (Shoulder Retraction)  Level 2 (Red)    Row  Theraband;10 reps    Theraband Level (Shoulder Row)  Level 2 (Red)      Shoulder Exercises: Sidelying   External Rotation  AROM;10 reps    Internal Rotation  AROM;10 reps    Flexion  AROM;10 reps    ABduction  AROM;10 reps    Other Sidelying Exercises  protraction, A/ROM, 10X    Other Sidelying Exercises  horizontal abduction, A/ROM, 10X      Shoulder Exercises: Standing   Protraction  AROM;10 reps    Horizontal ABduction  AROM;10 reps    External Rotation  AROM;10 reps    Internal Rotation  AROM;10 reps    Flexion  AROM;10 reps    ABduction  AROM;10 reps      Shoulder Exercises: ROM/Strengthening   X to V Arms  10X  Proximal Shoulder Strengthening, Seated  10X each, no rest breaks    Ball on Wall  1' flexion       Manual Therapy   Manual Therapy  Myofascial release    Manual therapy comments  completed separately from therapeutic exercises    Myofascial Release  myofascial release and manual techniques to right upper arm, anterior shoulder, trapezius, and scapular regions to decrease pain and fascial restrictions             OT Education - 11/21/19 1330    Education Details  scapular theraband    Person(s) Educated  Patient    Methods  Explanation;Demonstration;Handout    Comprehension  Verbalized understanding;Returned demonstration       OT Short Term Goals - 11/11/19 0927      OT SHORT TERM GOAL #1   Title  Pt will be provided with and educated on HEP to improve mobility required for use of RUE as dominant during ADLs.    Time  4    Period  Weeks    Status  On-going    Target Date  12/07/19      OT SHORT TERM GOAL #2   Title  Pt will increase RUE A/ROM to [facility] to improve ability to reach overhead and behind back during dressing tasks.    Time  4    Period  Weeks    Status  On-going      OT  SHORT TERM GOAL #3   Title  Pt will decrease RUE fascial restrictions to minimal amounts or less to improve mobility required for functional reaching tasks.    Time  4    Period  Weeks    Status  On-going      OT SHORT TERM GOAL #4   Title  Pt will decrease RUE pain to 4/10 or less to improve ability to use pull covers over her shoulder when trying to sleep.    Time  4    Period  Weeks    Status  On-going      OT SHORT TERM GOAL #5   Title  Pt will increase RUE strength to 4+/5 or greater to improve ability to carry grocery bags and perform gardening tasks.    Time  4    Period  Weeks    Status  On-going               Plan - 11/21/19 1316    Clinical Impression Statement  A: Pt reports high pain yesterday, no pain today. Continued with manual therapy, large muscle knot along medial border of scapula. Pt with full ROM during passive stretching and A/ROM, added sidelying A/ROM this session. Added ball on wall in flexion. Verbal cuing for form and technique.    Body Structure / Function / Physical Skills  ADL;Endurance;UE functional use;Muscle spasms;Fascial restriction;Pain;ROM;IADL;Strength    Plan  P: discontinue passive stretching, add ball on wall in flexion, add overhead lacing    OT Home Exercise Plan  2/5: shoulder stretches; 2/12: A/ROM exercises; 2/19: red scapular theraband    Consulted and Agree with Plan of Care  Patient       Patient will benefit from skilled therapeutic intervention in order to improve the following deficits and impairments:   Body Structure / Function / Physical Skills: ADL, Endurance, UE functional use, Muscle spasms, Fascial restriction, Pain, ROM, IADL, Strength       Visit Diagnosis: Acute pain of right shoulder  Other symptoms and signs  involving the musculoskeletal system    Problem List Patient Active Problem List   Diagnosis Date Noted  . Primary osteoarthritis involving multiple joints 12/31/2018  . Rheumatoid arthritis  involving multiple sites with positive rheumatoid factor ([place]) 12/31/2018  . Irritable bowel syndrome with both constipation and diarrhea 10/28/2018  . Constipation 03/14/2016  . Mitral valve prolapse 11/23/2015  . High cholesterol 11/23/2015   Guadelupe Sabin, OTR/L  [phone] 11/21/2019, 1:51 PM  [facility] [address] [place], [place], [address] Phone: [phone]   Fax:  [phone]  Name: JAVONNE PILZ MRN: [id number] Date of Birth: 05/24/1956

## 2019-11-25 ENCOUNTER — Encounter (HOSPITAL_COMMUNITY): Payer: Self-pay | Admitting: Occupational Therapy

## 2019-11-25 ENCOUNTER — Ambulatory Visit (HOSPITAL_COMMUNITY): Payer: BC Managed Care – PPO | Admitting: Occupational Therapy

## 2019-11-25 ENCOUNTER — Other Ambulatory Visit: Payer: Self-pay

## 2019-11-25 DIAGNOSIS — R29898 Other symptoms and signs involving the musculoskeletal system: Secondary | ICD-10-CM

## 2019-11-25 DIAGNOSIS — M25511 Pain in right shoulder: Secondary | ICD-10-CM | POA: Diagnosis not present

## 2019-11-25 NOTE — Therapy (Signed)
[place], [place], [address] Phone: [phone]   Fax:  [phone]  Occupational Therapy Treatment  Patient Details  Name: Vanessa Lara MRN: [id number] Date of Birth: Nov 04, 1955 Referring Provider (OT): Dr. Eunice Blase   Encounter Date: 11/25/2019  OT End of Session - 11/25/19 1656    Visit Number  6    Number of Visits  8    Date for OT Re-Evaluation  12/07/19    Authorization Type  BCBS    OT Start Time  1514    OT Stop Time  1559    OT Time Calculation (min)  45 min    Activity Tolerance  Patient tolerated treatment well    Behavior During Therapy  [facility] for tasks assessed/performed       Past Medical History:  Diagnosis Date  . Arthritis   . Depression   . Endometriosis   . High cholesterol   . IBS (irritable bowel syndrome)   . Mitral valve prolapse   . Uterus, adenomyosis     Past Surgical History:  Procedure Laterality Date  . COLONOSCOPY    . COLONOSCOPY N/A 12/13/2015   Procedure: COLONOSCOPY;  Surgeon: Rogene Houston, MD;  [facility];  Service: Endoscopy;  Laterality: N/A;  9:55  . Fatty tumor     2011 (left arm)  . TONSILLECTOMY    . TOTAL ABDOMINAL HYSTERECTOMY     2003    There were no vitals filed for this visit.  Subjective Assessment - 11/25/19 1513    Subjective   S: I had a really bad weekend just feeling wiped out.    Currently in Pain?  Yes    Pain Score  5     Pain Location  Shoulder    Pain Orientation  Right    Pain Descriptors / Indicators  Aching;Sore    Pain Type  Acute pain    Pain Radiating Towards  elbow    Pain Onset  In the past 7 days    Pain Frequency  Constant    Aggravating Factors   movement, use    Pain Relieving Factors  rest    Effect of Pain on Daily Activities  mod effect on ADLs    Multiple Pain Sites  No         [facility] OT Assessment - 11/25/19 1513      Assessment   Medical Diagnosis  right shoulder pain      Precautions   Precautions  None               OT Treatments/Exercises (OP) - 11/25/19 1516      Exercises   Exercises  Shoulder      Shoulder Exercises: Seated   Extension  Theraband;10 reps    Theraband Level (Shoulder Extension)  Level 2 (Red)    Row  Theraband;10 reps    Theraband Level (Shoulder Row)  Level 2 (Red)      Shoulder Exercises: Sidelying   External Rotation  AROM;10 reps    Internal Rotation  AROM;10 reps    Flexion  AROM;10 reps    ABduction  AROM;10 reps    Other Sidelying Exercises  protraction, A/ROM, 10X    Other Sidelying Exercises  horizontal abduction, A/ROM, 10X      Shoulder Exercises: Standing   Protraction  AROM;10 reps    Horizontal ABduction  AROM;10 reps    External Rotation  AROM;10 reps  Internal Rotation  AROM;10 reps    Flexion  AROM;10 reps    ABduction  AROM;10 reps      Shoulder Exercises: ROM/Strengthening   "W" Arms  10X    X to V Arms  10X    Proximal Shoulder Strengthening, Seated  10X each, no rest breaks    Other ROM/Strengthening Exercises  proximal shoulder strengthening with washcloth on doorway, 1' flexion 1' abduction      Manual Therapy   Manual Therapy  Myofascial release    Manual therapy comments  completed separately from therapeutic exercises    Myofascial Release  myofascial release and manual techniques to right upper arm, anterior shoulder, trapezius, and scapular regions to decrease pain and fascial restrictions               OT Short Term Goals - 11/11/19 [id number]      OT SHORT TERM GOAL #1   Title  Pt will be provided with and educated on HEP to improve mobility required for use of RUE as dominant during ADLs.    Time  4    Period  Weeks    Status  On-going    Target Date  12/07/19      OT SHORT TERM GOAL #2   Title  Pt will increase RUE A/ROM to [facility] to improve ability to reach overhead and behind back during dressing tasks.    Time  4    Period  Weeks    Status  On-going      OT  SHORT TERM GOAL #3   Title  Pt will decrease RUE fascial restrictions to minimal amounts or less to improve mobility required for functional reaching tasks.    Time  4    Period  Weeks    Status  On-going      OT SHORT TERM GOAL #4   Title  Pt will decrease RUE pain to 4/10 or less to improve ability to use pull covers over her shoulder when trying to sleep.    Time  4    Period  Weeks    Status  On-going      OT SHORT TERM GOAL #5   Title  Pt will increase RUE strength to 4+/5 or greater to improve ability to carry grocery bags and perform gardening tasks.    Time  4    Period  Weeks    Status  On-going               Plan - 11/25/19 1529    Clinical Impression Statement  A: Pt reports she did her HEP 2x over the weekend. Continued with manual techniques to address muscle knot at scapular border-reduced to moderate sized today. Continued with sidelying and standing A/ROM, added proximal shoulder strengthening on doorway with washcloth as pt was unable to complete ball on wall due to wrist discomfort. Pt completing scapular theraband, unable to complete retraction today due to pain. Added w arms. Verbal cuing for form and technique.    Body Structure / Function / Physical Skills  ADL;Endurance;UE functional use;Muscle spasms;Fascial restriction;Pain;ROM;IADL;Strength    Plan  P: Add overhead lacing, green therapy ball strengthening, attempt retraction with theraband    OT Home Exercise Plan  2/5: shoulder stretches; 2/12: A/ROM exercises; 2/19: red scapular theraband       Patient will benefit from skilled therapeutic intervention in order to improve the following deficits and impairments:   Body Structure / Function / Physical Skills: ADL, Endurance,  UE functional use, Muscle spasms, Fascial restriction, Pain, ROM, IADL, Strength       Visit Diagnosis: Acute pain of right shoulder  Other symptoms and signs involving the musculoskeletal system    Problem List Patient  Active Problem List   Diagnosis Date Noted  . Primary osteoarthritis involving multiple joints 12/31/2018  . Rheumatoid arthritis involving multiple sites with positive rheumatoid factor ([place]) 12/31/2018  . Irritable bowel syndrome with both constipation and diarrhea 10/28/2018  . Constipation 03/14/2016  . Mitral valve prolapse 11/23/2015  . High cholesterol 11/23/2015   Guadelupe Sabin, OTR/L  [phone] 11/25/2019, 4:58 PM  [facility] [address] [place], [place], [address] Phone: [phone]   Fax:  [phone]  Name: DEONNA HEFFELFINGER MRN: [id number] Date of Birth: 20-May-1956

## 2019-11-26 DIAGNOSIS — M9902 Segmental and somatic dysfunction of thoracic region: Secondary | ICD-10-CM | POA: Diagnosis not present

## 2019-11-26 DIAGNOSIS — M546 Pain in thoracic spine: Secondary | ICD-10-CM | POA: Diagnosis not present

## 2019-11-26 DIAGNOSIS — M9903 Segmental and somatic dysfunction of lumbar region: Secondary | ICD-10-CM | POA: Diagnosis not present

## 2019-11-26 DIAGNOSIS — M545 Low back pain: Secondary | ICD-10-CM | POA: Diagnosis not present

## 2019-11-28 ENCOUNTER — Ambulatory Visit (HOSPITAL_COMMUNITY): Payer: BC Managed Care – PPO | Admitting: Occupational Therapy

## 2019-11-28 ENCOUNTER — Telehealth (HOSPITAL_COMMUNITY): Payer: Self-pay | Admitting: Occupational Therapy

## 2019-11-28 NOTE — Telephone Encounter (Signed)
Pt left message to cancel today's appt. Reports arm is in too much pain to do therapy today.   Vanessa Lara, OTR/L  [phone] 11/28/2019

## 2019-12-01 ENCOUNTER — Other Ambulatory Visit: Payer: Self-pay

## 2019-12-01 ENCOUNTER — Encounter: Payer: Self-pay | Admitting: Family Medicine

## 2019-12-01 ENCOUNTER — Ambulatory Visit: Payer: BC Managed Care – PPO | Admitting: Family Medicine

## 2019-12-01 VITALS — Ht 60.0 in | Wt 94.0 lb

## 2019-12-01 DIAGNOSIS — M79601 Pain in right arm: Secondary | ICD-10-CM | POA: Diagnosis not present

## 2019-12-01 DIAGNOSIS — M25531 Pain in right wrist: Secondary | ICD-10-CM | POA: Diagnosis not present

## 2019-12-01 NOTE — Progress Notes (Signed)
Office Visit Note   Patient: Vanessa Lara           Date of Birth: [birth year]           MRN: [id number] Visit Date: 12/01/2019 Requested by: Eunice Blase, MD [id number] [place],  [place] [address] PCP: Eunice Blase, MD  Subjective: No chief complaint on file.   HPI: She is here with persistent right shoulder pain.  She was getting better with therapy, but as her exercises were advanced, her pain worsened again.  Pain seems to be primarily in the proximal biceps area and then radiates to the anterior shoulder.  The pain is constant but somehow she is still able to sleep at night.  The pain is present as soon as she wakes up in the morning.              ROS:   All other systems were reviewed and are negative.  Objective: Vital Signs: There were no vitals taken for this visit.  Physical Exam:  General:  Alert and oriented, in no acute distress. Pulm:  Breathing unlabored. Psy:  Normal mood, congruent affect.  Right shoulder: She has full active range of motion with pain reaching behind the back.  She is tender in the bicep muscle belly, no Popeye deformity.  She is also tender in the anterior glenohumeral area.  She has pain with internal rotation against resistance but her rotator cuff strength is still 5/5 throughout.  Imaging: None today  Assessment & Plan: 1.  Persistent right shoulder pain, etiology uncertain.  Could be glenohumeral arthritis related to her RA.  Cannot rule out partial rotator cuff tear. -Elected to order MRI scan of the right shoulder.  I will contact her afterward to go over the results, determine whether surgical consult is warranted. -We will place therapy on hold for now.     Procedures: No procedures performed  No notes on file     PMFS History: Patient Active Problem List   Diagnosis Date Noted  . Primary osteoarthritis involving multiple joints 12/31/2018  . Rheumatoid arthritis involving multiple sites with positive rheumatoid  factor ([place]) 12/31/2018  . Irritable bowel syndrome with both constipation and diarrhea 10/28/2018  . Constipation 03/14/2016  . Mitral valve prolapse 11/23/2015  . High cholesterol 11/23/2015   Past Medical History:  Diagnosis Date  . Arthritis   . Depression   . Endometriosis   . High cholesterol   . IBS (irritable bowel syndrome)   . Mitral valve prolapse   . Uterus, adenomyosis     Family History  Problem Relation Age of Onset  . Colon cancer Other   . Breast cancer Sister     Past Surgical History:  Procedure Laterality Date  . COLONOSCOPY    . COLONOSCOPY N/A 12/13/2015   Procedure: COLONOSCOPY;  Surgeon: Rogene Houston, MD;  [facility];  Service: Endoscopy;  Laterality: N/A;  9:55  . Fatty tumor     2011 (left arm)  . TONSILLECTOMY    . TOTAL ABDOMINAL HYSTERECTOMY     2003   Social History   Occupational History  . Not on file  Tobacco Use  . Smoking status: Former Smoker    Packs/day: 0.50    Years: 33.00    Pack years: 16.50    Types: Cigarettes  . Smokeless tobacco: Never Used  . Tobacco comment: quit 3 yrs ago  Substance and Sexual Activity  . Alcohol use: No  Alcohol/week: 0.0 standard drinks  . Drug use: No  . Sexual activity: Not on file

## 2019-12-02 ENCOUNTER — Ambulatory Visit (HOSPITAL_COMMUNITY): Payer: BC Managed Care – PPO | Admitting: Occupational Therapy

## 2019-12-04 ENCOUNTER — Ambulatory Visit (HOSPITAL_COMMUNITY): Payer: BC Managed Care – PPO | Admitting: Occupational Therapy

## 2019-12-05 ENCOUNTER — Other Ambulatory Visit: Payer: Self-pay

## 2019-12-05 ENCOUNTER — Telehealth: Payer: Self-pay | Admitting: Family Medicine

## 2019-12-05 ENCOUNTER — Ambulatory Visit (HOSPITAL_COMMUNITY)
Admission: RE | Admit: 2019-12-05 | Discharge: 2019-12-05 | Disposition: A | Discharge status: Home / Self Care | Payer: BC Managed Care – PPO | Source: Ambulatory Visit | Attending: Family Medicine | Admitting: Family Medicine

## 2019-12-05 DIAGNOSIS — S46011A Strain of muscle(s) and tendon(s) of the rotator cuff of right shoulder, initial encounter: Secondary | ICD-10-CM | POA: Diagnosis not present

## 2019-12-05 DIAGNOSIS — M79601 Pain in right arm: Secondary | ICD-10-CM | POA: Diagnosis not present

## 2019-12-05 DIAGNOSIS — M75101 Unspecified rotator cuff tear or rupture of right shoulder, not specified as traumatic: Secondary | ICD-10-CM | POA: Diagnosis not present

## 2019-12-05 NOTE — Telephone Encounter (Signed)
Right shoulder MRI scan shows moderate to severe tendinosis of the rotator cuff with partial tear of the supraspinatus tendon. There is bursitis in the shoulder as well.

## 2019-12-08 ENCOUNTER — Ambulatory Visit: Payer: Self-pay

## 2019-12-08 ENCOUNTER — Other Ambulatory Visit: Payer: Self-pay

## 2019-12-08 ENCOUNTER — Encounter: Payer: Self-pay | Admitting: Family Medicine

## 2019-12-08 ENCOUNTER — Ambulatory Visit (INDEPENDENT_AMBULATORY_CARE_PROVIDER_SITE_OTHER): Payer: BC Managed Care – PPO | Admitting: Family Medicine

## 2019-12-08 DIAGNOSIS — M9902 Segmental and somatic dysfunction of thoracic region: Secondary | ICD-10-CM | POA: Diagnosis not present

## 2019-12-08 DIAGNOSIS — G8929 Other chronic pain: Secondary | ICD-10-CM

## 2019-12-08 DIAGNOSIS — M25511 Pain in right shoulder: Secondary | ICD-10-CM

## 2019-12-08 DIAGNOSIS — M9903 Segmental and somatic dysfunction of lumbar region: Secondary | ICD-10-CM | POA: Diagnosis not present

## 2019-12-08 DIAGNOSIS — M545 Low back pain: Secondary | ICD-10-CM | POA: Diagnosis not present

## 2019-12-08 DIAGNOSIS — M546 Pain in thoracic spine: Secondary | ICD-10-CM | POA: Diagnosis not present

## 2019-12-08 NOTE — Progress Notes (Signed)
Office Visit Note   Patient: Vanessa Lara           Date of Birth: Dec 18, 1955           MRN: [id number] Visit Date: 12/08/2019 Requested by: Eunice Blase, MD [address] [place],  [place] [address] PCP: Eunice Blase, MD  Subjective: Chief Complaint  Patient presents with  . Right Shoulder - Follow-up    HPI: She is here with persistent right shoulder pain.  Pain on top of her shoulder and pain into the biceps area.  Last time we spoke she had recently been through an MRI scan which showed moderate to severe rotator cuff tendinosis with partial tear of the supraspinatus tendon and subacromial/subdeltoid bursitis.  With the pandemic, she has been trying to live with her pain.  Physical therapy seemed to make things worse, she is trying to minimize her use of her arm without it getting too stiff.              ROS:   All other systems were reviewed and are negative.  Objective: Vital Signs: There were no vitals taken for this visit.  Physical Exam:  General:  Alert and oriented, in no acute distress. Pulm:  Breathing unlabored. Psy:  Normal mood, congruent affect.  Right shoulder: She has full active range of motion with no adhesive capsulitis.  She is tender in the posterior subacromial space.  She does not have a Popeye deformity.  She still has 5/5 rotator cuff strength throughout.  Imaging: None today  Assessment & Plan: 1.  Chronic right shoulder rotator cuff tendinopathy with partial tear of supraspinatus tendon -We discussed treatment options and she wants to try a glenohumeral injection today. -If symptoms persist, she will consult with Dr. Ninfa Linden for possible surgical intervention.    Procedures:  Ultrasound-guided right glenohumeral injection: After sterile prep with Betadine, injected 8 cc 1% lidocaine without epinephrine and 40 mg methylprednisolone using a 22-gauge spinal needle, passing the needle through approach into the glenohumeral joint.  Injectate  seen filling joint capsule.  Good immediate relief.       PMFS History: Patient Active Problem List   Diagnosis Date Noted  . Primary osteoarthritis involving multiple joints 12/31/2018  . Rheumatoid arthritis involving multiple sites with positive rheumatoid factor ([place]) 12/31/2018  . Irritable bowel syndrome with both constipation and diarrhea 10/28/2018  . Constipation 03/14/2016  . Mitral valve prolapse 11/23/2015  . High cholesterol 11/23/2015   Past Medical History:  Diagnosis Date  . Arthritis   . Depression   . Endometriosis   . High cholesterol   . IBS (irritable bowel syndrome)   . Mitral valve prolapse   . Uterus, adenomyosis     Family History  Problem Relation Age of Onset  . Colon cancer Other   . Breast cancer Sister     Past Surgical History:  Procedure Laterality Date  . COLONOSCOPY    . COLONOSCOPY N/A 12/13/2015   Procedure: COLONOSCOPY;  Surgeon: Rogene Houston, MD;  [facility];  Service: Endoscopy;  Laterality: N/A;  9:55  . Fatty tumor     2011 (left arm)  . TONSILLECTOMY    . TOTAL ABDOMINAL HYSTERECTOMY     2003   Social History   Occupational History  . Not on file  Tobacco Use  . Smoking status: Former Smoker    Packs/day: 0.50    Years: 33.00    Pack years: 16.50    Types: Cigarettes  .  Smokeless tobacco: Never Used  . Tobacco comment: quit 3 yrs ago  Substance and Sexual Activity  . Alcohol use: No    Alcohol/week: 0.0 standard drinks  . Drug use: No  . Sexual activity: Not on file

## 2019-12-09 ENCOUNTER — Encounter (HOSPITAL_COMMUNITY): Payer: BC Managed Care – PPO | Admitting: Occupational Therapy

## 2019-12-09 ENCOUNTER — Encounter (HOSPITAL_COMMUNITY): Payer: Self-pay | Admitting: Occupational Therapy

## 2019-12-09 NOTE — Therapy (Signed)
[facility] [place], [place], [address] Phone: [phone]   Fax:  [phone]  Patient Details  Name: LEEANN BADY MRN: [id number] Date of Birth: 09/15/1956 Referring Provider:  No ref. provider found  Encounter Date: 12/09/2019   OCCUPATIONAL THERAPY DISCHARGE SUMMARY  Visits from Start of Care: 6  Current functional level related to goals / functional outcomes: Pt has ROM WNL, strength is decreased due to pain and RC tear. Pt had MRI showing partial tear, had US guided injection on 12/08/19. Discussed therapy potential with pt with and without surgery, pt is agreeable to discharge at this time and will return if she decides to have surgery or if MD sends her back.    Remaining deficits: Pain and decreased strength in RUE   Education / Equipment: HEP for ROM Plan: Patient agrees to discharge.  Patient goals were partially met. Patient is being discharged due to meeting the stated rehab goals.  ?????      Guadelupe Sabin, OTR/L  [phone] 12/09/2019, 3:34 PM  [facility] [address] [place], [place], [address] Phone: [phone]   Fax:  [phone]

## 2019-12-11 ENCOUNTER — Encounter (HOSPITAL_COMMUNITY): Payer: BC Managed Care – PPO | Admitting: Occupational Therapy

## 2019-12-15 ENCOUNTER — Telehealth: Payer: Self-pay | Admitting: Family Medicine

## 2019-12-15 MED ORDER — PAROXETINE HCL 10 MG PO TABS: 10.0000 mg | ORAL_TABLET | Freq: Every day | ORAL | 1 refills | Status: DC

## 2019-12-15 NOTE — Telephone Encounter (Signed)
Please advise 

## 2019-12-15 NOTE — Telephone Encounter (Signed)
Sent!

## 2019-12-15 NOTE — Addendum Note (Signed)
Addended by: Hortencia Pilar on: 12/15/2019 11:58 AM   Modules accepted: Orders

## 2019-12-15 NOTE — Telephone Encounter (Signed)
Pt called in stating she needed a refill of proxil sent to her [facility] in [place] on [address].  [phone]

## 2020-01-05 ENCOUNTER — Telehealth: Payer: Self-pay | Admitting: Family Medicine

## 2020-01-05 MED ORDER — DICYCLOMINE HCL 10 MG PO CAPS: 10.0000 mg | ORAL_CAPSULE | Freq: Three times a day (TID) | ORAL | 3 refills | Status: DC

## 2020-01-05 NOTE — Telephone Encounter (Signed)
Left message on the patient's voice mail to call back to clarify medication request.

## 2020-01-05 NOTE — Telephone Encounter (Signed)
I called and left a message on the patient's home voice mail that the medication was sent in to her pharmacy.

## 2020-01-05 NOTE — Telephone Encounter (Signed)
Patient called wanting to know if Dr. Junius Roads would send in a RX refill for her Dicyclomine and wanted to know if she can take one pill per day.  [id number].  Patient uses [facility] on [address] in [place].  Thank you.

## 2020-01-05 NOTE — Telephone Encounter (Signed)
Rx sent 

## 2020-01-05 NOTE — Telephone Encounter (Signed)
Please advise 

## 2020-01-05 NOTE — Telephone Encounter (Signed)
Patient called back.  She takes this medication for IBS.  Thank you.

## 2020-01-05 NOTE — Telephone Encounter (Signed)
I don't see this listed in her chart.  What does she use it for?

## 2020-01-30 DIAGNOSIS — M546 Pain in thoracic spine: Secondary | ICD-10-CM | POA: Diagnosis not present

## 2020-01-30 DIAGNOSIS — M9902 Segmental and somatic dysfunction of thoracic region: Secondary | ICD-10-CM | POA: Diagnosis not present

## 2020-01-30 DIAGNOSIS — M542 Cervicalgia: Secondary | ICD-10-CM | POA: Diagnosis not present

## 2020-01-30 DIAGNOSIS — M9901 Segmental and somatic dysfunction of cervical region: Secondary | ICD-10-CM | POA: Diagnosis not present

## 2020-02-06 ENCOUNTER — Other Ambulatory Visit: Payer: Self-pay

## 2020-02-06 ENCOUNTER — Ambulatory Visit (INDEPENDENT_AMBULATORY_CARE_PROVIDER_SITE_OTHER): Payer: BC Managed Care – PPO | Admitting: Family Medicine

## 2020-02-06 ENCOUNTER — Encounter: Payer: Self-pay | Admitting: Family Medicine

## 2020-02-06 DIAGNOSIS — M25511 Pain in right shoulder: Secondary | ICD-10-CM | POA: Diagnosis not present

## 2020-02-06 DIAGNOSIS — M25531 Pain in right wrist: Secondary | ICD-10-CM

## 2020-02-06 DIAGNOSIS — G8929 Other chronic pain: Secondary | ICD-10-CM | POA: Diagnosis not present

## 2020-02-06 MED ORDER — PREDNISONE 10 MG PO TABS: ORAL_TABLET | ORAL | 0 refills | Status: DC

## 2020-02-06 NOTE — Progress Notes (Signed)
Pain has returned.  Pain returned on sunday

## 2020-02-06 NOTE — Progress Notes (Signed)
Office Visit Note   Patient: Lara Lara           Date of Birth: May 14, 1956           MRN: [id number] Visit Date: 02/06/2020 Requested by: Vanessa Blase, MD [address] [place],  [place] [address] PCP: Vanessa Blase, MD  Subjective: Chief Complaint  Patient presents with  . Right Shoulder - Follow-up  . Right Wrist - Follow-up    HPI: She is here with recurrent right shoulder and wrist pain.  From a shoulder standpoint, she got relief from both biceps tendon injection and a glenohumeral injection.  She was not pain-free but it was much better.  She was cleaning her house last week and it seems to have aggravated both her shoulder and her wrist.  From a rheumatoid arthritis standpoint, she did not tolerate methotrexate.  She did not tolerate other Biologics.  She is very reluctant to go back to a rheumatologist.              ROS:   All other systems were reviewed and are negative.  Objective: Vital Signs: There were no vitals taken for this visit.  Physical Exam:  General:  Alert and oriented, in no acute distress. Pulm:  Breathing unlabored. Psy:  Normal mood, congruent affect.  Right shoulder: Pain with overhead reach but still full range of motion, no adhesive capsulitis.  Tender anteriorly and posteriorly. Right wrist: It seems as though she has a small ganglion cyst on the volar aspect of the radial side of her wrist.  She is tender to palpation there and also on the dorsum where she has some synovitis.  Imaging: No results found.  Assessment & Plan: 1.  Recurrent right shoulder pain with rotator cuff tendinopathy and partial supraspinatus tear. -Prednisone taper.  Could reinject with cortisone or make referral to Dr. Ninfa Lara if symptoms persist.  2.  Chronic right wrist pain -MRI to look for ganglion cyst.  Prednisone taper.  Reinject if pain persist and no indication for surgery on MRI scan.     Procedures: No procedures performed  No notes on file      PMFS History: Patient Active Problem List   Diagnosis Date Noted  . Primary osteoarthritis involving multiple joints 12/31/2018  . Rheumatoid arthritis involving multiple sites with positive rheumatoid factor ([place]) 12/31/2018  . Irritable bowel syndrome with both constipation and diarrhea 10/28/2018  . Constipation 03/14/2016  . Mitral valve prolapse 11/23/2015  . High cholesterol 11/23/2015   Past Medical History:  Diagnosis Date  . Arthritis   . Depression   . Endometriosis   . High cholesterol   . IBS (irritable bowel syndrome)   . Mitral valve prolapse   . Uterus, adenomyosis     Family History  Problem Relation Age of Onset  . Colon cancer Other   . Breast cancer Sister     Past Surgical History:  Procedure Laterality Date  . COLONOSCOPY    . COLONOSCOPY N/A 12/13/2015   Procedure: COLONOSCOPY;  Surgeon: Rogene Houston, MD;  [facility];  Service: Endoscopy;  Laterality: N/A;  9:55  . Fatty tumor     2011 (left arm)  . TONSILLECTOMY    . TOTAL ABDOMINAL HYSTERECTOMY     2003   Social History   Occupational History  . Not on file  Tobacco Use  . Smoking status: Former Smoker    Packs/day: 0.50    Years: 33.00    Pack  years: 16.50    Types: Cigarettes  . Smokeless tobacco: Never Used  . Tobacco comment: quit 3 yrs ago  Substance and Sexual Activity  . Alcohol use: No    Alcohol/week: 0.0 standard drinks  . Drug use: No  . Sexual activity: Not on file

## 2020-02-10 ENCOUNTER — Other Ambulatory Visit: Payer: Self-pay | Admitting: Family Medicine

## 2020-02-10 DIAGNOSIS — M25531 Pain in right wrist: Secondary | ICD-10-CM

## 2020-02-11 ENCOUNTER — Telehealth: Payer: Self-pay | Admitting: Family Medicine

## 2020-02-11 NOTE — Telephone Encounter (Signed)
I called the patient. She will be coming in for a right wrist xray (only) on 02/12/20 at 2:00.

## 2020-02-11 NOTE — Telephone Encounter (Signed)
Patient called requesting a call back from [place]. Patients states she is returning a call and has questions for Terri. Patient phone number is [phone].

## 2020-02-12 ENCOUNTER — Ambulatory Visit: Payer: BC Managed Care – PPO

## 2020-02-12 ENCOUNTER — Other Ambulatory Visit: Payer: Self-pay

## 2020-02-12 ENCOUNTER — Ambulatory Visit (INDEPENDENT_AMBULATORY_CARE_PROVIDER_SITE_OTHER): Payer: BC Managed Care – PPO

## 2020-02-12 DIAGNOSIS — M25531 Pain in right wrist: Secondary | ICD-10-CM

## 2020-02-12 NOTE — Progress Notes (Signed)
Patient came in for right wrist xray (will be having MRI right wrist) per Dr. Junius Roads.

## 2020-02-13 ENCOUNTER — Telehealth: Payer: Self-pay | Admitting: Family Medicine

## 2020-02-13 NOTE — Telephone Encounter (Signed)
X-rays right wrist taken Feb 12, 2020: She has osteopenic appearance of her bones.  She has cystic changes in the distal radius, lunate, and the triquetrum.  There is diffuse degenerative joint disease throughout the wrist.  Anatomic alignment, no sign of fracture.

## 2020-02-13 NOTE — Telephone Encounter (Signed)
This is the notation on the patient's right wrist xray that you needed for prior authorization.

## 2020-02-16 NOTE — Telephone Encounter (Signed)
Thank you!  It is approved and they should be calling her to schedule.

## 2020-02-25 ENCOUNTER — Telehealth: Payer: Self-pay | Admitting: Family Medicine

## 2020-02-25 DIAGNOSIS — M546 Pain in thoracic spine: Secondary | ICD-10-CM | POA: Diagnosis not present

## 2020-02-25 DIAGNOSIS — M545 Low back pain: Secondary | ICD-10-CM | POA: Diagnosis not present

## 2020-02-25 DIAGNOSIS — M9903 Segmental and somatic dysfunction of lumbar region: Secondary | ICD-10-CM | POA: Diagnosis not present

## 2020-02-25 DIAGNOSIS — M9902 Segmental and somatic dysfunction of thoracic region: Secondary | ICD-10-CM | POA: Diagnosis not present

## 2020-02-25 NOTE — Telephone Encounter (Signed)
Pt called stating she needs to talk to [place] regarding some medication she is taking; pt states she may be at a chiropractor appt when Karna Christmas returns her call if so she will call Terri back in the morning.   [phone]

## 2020-02-27 DIAGNOSIS — M546 Pain in thoracic spine: Secondary | ICD-10-CM | POA: Diagnosis not present

## 2020-02-27 DIAGNOSIS — M545 Low back pain: Secondary | ICD-10-CM | POA: Diagnosis not present

## 2020-02-27 DIAGNOSIS — M9903 Segmental and somatic dysfunction of lumbar region: Secondary | ICD-10-CM | POA: Diagnosis not present

## 2020-02-27 DIAGNOSIS — M9902 Segmental and somatic dysfunction of thoracic region: Secondary | ICD-10-CM | POA: Diagnosis not present

## 2020-02-27 NOTE — Telephone Encounter (Signed)
I sent a mychart message with suggestions.

## 2020-02-27 NOTE — Telephone Encounter (Signed)
I left a voice mail, advising the patient to check MyChart for Dr. Junius Roads' message. If she cannot get into it to see the message, I advised her to call me back.

## 2020-02-27 NOTE — Telephone Encounter (Signed)
Patient finished the prednisone 3 weeks ago. She is feeling "great; not felt this great since I was 23." However, she is having difficulty sleeping. She has Trazodone to take, but 100 mg makes her feel drowsy/dizzy the next day and 50 mg only gives her an hour's worth of sleep. The patient is asking for a small supply of a pediatric dose of a medication for anxiety to help her sleep. Please advise.

## 2020-03-03 ENCOUNTER — Telehealth: Payer: Self-pay | Admitting: Family Medicine

## 2020-03-03 NOTE — Telephone Encounter (Signed)
Patient called requesting a call back as soon as possible. Patient states to need help from [place] or Dr. Junius Roads. Patient didn't disclose what she needed help with and just asked for a call back from either doctor or nurse. Patient phone number is [phone].

## 2020-03-04 ENCOUNTER — Telehealth: Payer: Self-pay | Admitting: Family Medicine

## 2020-03-04 MED ORDER — CELECOXIB 200 MG PO CAPS: 200.0000 mg | ORAL_CAPSULE | Freq: Two times a day (BID) | ORAL | 6 refills | Status: DC | PRN

## 2020-03-04 NOTE — Telephone Encounter (Signed)
Patient has called again with more information today - medication question. That message has been forwarded to Dr. Junius Roads for advisement - please refer to that message.

## 2020-03-04 NOTE — Telephone Encounter (Signed)
I called and advised the patient of the change in medication. As she has already taken meloxicam today, she will start the celebrex tomorrow. She said she has been having swelling and pain in her lower legs. I scheduled her an appointment with Dr. Junius Roads tomorrow morning at 9:20 to evaluate.

## 2020-03-04 NOTE — Addendum Note (Signed)
Addended by: Hortencia Pilar on: 03/04/2020 12:10 PM   Modules accepted: Orders

## 2020-03-04 NOTE — Telephone Encounter (Signed)
She has the 15 mg strength of meloxicam. Please advise.

## 2020-03-04 NOTE — Telephone Encounter (Signed)
15 mg is max dose.  I'll call in celebrex to try.

## 2020-03-04 NOTE — Telephone Encounter (Signed)
Patient called.   She is requesting a call back to see if it is it okay to take one and a half of the meloxicam because she is swelling in her joints.   Call back: [phone]

## 2020-03-05 ENCOUNTER — Ambulatory Visit (INDEPENDENT_AMBULATORY_CARE_PROVIDER_SITE_OTHER): Payer: BC Managed Care – PPO | Admitting: Family Medicine

## 2020-03-05 ENCOUNTER — Encounter: Payer: Self-pay | Admitting: Family Medicine

## 2020-03-05 ENCOUNTER — Other Ambulatory Visit: Payer: Self-pay

## 2020-03-05 DIAGNOSIS — M0579 Rheumatoid arthritis with rheumatoid factor of multiple sites without organ or systems involvement: Secondary | ICD-10-CM | POA: Diagnosis not present

## 2020-03-05 DIAGNOSIS — M545 Low back pain, unspecified: Secondary | ICD-10-CM

## 2020-03-05 DIAGNOSIS — R739 Hyperglycemia, unspecified: Secondary | ICD-10-CM

## 2020-03-05 MED ORDER — BACLOFEN 10 MG PO TABS: 5.0000 mg | ORAL_TABLET | Freq: Three times a day (TID) | ORAL | 3 refills | Status: DC | PRN

## 2020-03-05 NOTE — Progress Notes (Signed)
Pain for one week Is seeing a chiropractor  Right sided low back pain with some leg pain No groin pain No known injury

## 2020-03-05 NOTE — Progress Notes (Signed)
Office Visit Note   Patient: Vanessa Lara           Date of Birth: [birth year]           MRN: [id number] Visit Date: 03/05/2020 Requested by: Eunice Blase, MD [address] [place],  [place] [address] PCP: Eunice Blase, MD  Subjective: Chief Complaint  Patient presents with  . Lower Back - Pain    HPI: She is here with right posterior hip pain.  Symptoms started a week or 2 ago, no definite injury.  Pain does not radiate down the leg, no bowel or bladder dysfunction.  She is seeing Dr. Lovena Le in [place] for chiropractic treatments and it helps temporarily.  She is doing better from the rheumatoid arthritis standpoint.  Prednisone helped a lot, but she had some side effects with it.  She was very anxious, she developed thrush in her mouth.  She is feeling better now.  She would like to have labs drawn in the near future for evaluation/monitoring of her condition.               ROS:   All other systems were reviewed and are negative.  Objective: Vital Signs: There were no vitals taken for this visit.  Physical Exam:  General:  Alert and oriented, in no acute distress. Pulm:  Breathing unlabored. Psy:  Normal mood, congruent affect. Skin: No rash on her posterior hip. Low back: She is tender near the right SI joint.  No significant pain in the sciatic notch.  No pain with internal hip rotation.  Piriformis stretch is equivocal.  Lower extremity strength and reflexes are normal.  Imaging: No results found.  Assessment & Plan: 1.  Right posterior hip pain, possible SI joint dysfunction. -Continue with chiropractic.  Try baclofen as needed.  Could contemplate injection if symptoms persist.  2.  Rheumatoid arthritis -Labs in the near future.     Procedures: No procedures performed  No notes on file     PMFS History: Patient Active Problem List   Diagnosis Date Noted  . Primary osteoarthritis involving multiple joints 12/31/2018  . Rheumatoid arthritis  involving multiple sites with positive rheumatoid factor ([place]) 12/31/2018  . Irritable bowel syndrome with both constipation and diarrhea 10/28/2018  . Constipation 03/14/2016  . Mitral valve prolapse 11/23/2015  . High cholesterol 11/23/2015   Past Medical History:  Diagnosis Date  . Arthritis   . Depression   . Endometriosis   . High cholesterol   . IBS (irritable bowel syndrome)   . Mitral valve prolapse   . Uterus, adenomyosis     Family History  Problem Relation Age of Onset  . Colon cancer Other   . Breast cancer Sister     Past Surgical History:  Procedure Laterality Date  . COLONOSCOPY    . COLONOSCOPY N/A 12/13/2015   Procedure: COLONOSCOPY;  Surgeon: Rogene Houston, MD;  [facility];  Service: Endoscopy;  Laterality: N/A;  9:55  . Fatty tumor     2011 (left arm)  . TONSILLECTOMY    . TOTAL ABDOMINAL HYSTERECTOMY     2003   Social History   Occupational History  . Not on file  Tobacco Use  . Smoking status: Former Smoker    Packs/day: 0.50    Years: 33.00    Pack years: 16.50    Types: Cigarettes  . Smokeless tobacco: Never Used  . Tobacco comment: quit 3 yrs ago  Substance and Sexual Activity  .  Alcohol use: No    Alcohol/week: 0.0 standard drinks  . Drug use: No  . Sexual activity: Not on file

## 2020-03-08 ENCOUNTER — Telehealth: Payer: Self-pay | Admitting: Family Medicine

## 2020-03-08 DIAGNOSIS — M546 Pain in thoracic spine: Secondary | ICD-10-CM | POA: Diagnosis not present

## 2020-03-08 DIAGNOSIS — M545 Low back pain: Secondary | ICD-10-CM | POA: Diagnosis not present

## 2020-03-08 DIAGNOSIS — M9902 Segmental and somatic dysfunction of thoracic region: Secondary | ICD-10-CM | POA: Diagnosis not present

## 2020-03-08 DIAGNOSIS — M9903 Segmental and somatic dysfunction of lumbar region: Secondary | ICD-10-CM | POA: Diagnosis not present

## 2020-03-08 NOTE — Telephone Encounter (Signed)
Left voicemail message for patient to call me back.

## 2020-03-08 NOTE — Telephone Encounter (Signed)
Patient called requesting a call back frmo Vanessa Lara. Patient stated she need labs draw but car is in shop and probably wont have transportation back until next week. Also antiinflammatory medication was too strong for patient. Patient phone numb er is [phone].

## 2020-03-10 DIAGNOSIS — M9902 Segmental and somatic dysfunction of thoracic region: Secondary | ICD-10-CM | POA: Diagnosis not present

## 2020-03-10 DIAGNOSIS — M546 Pain in thoracic spine: Secondary | ICD-10-CM | POA: Diagnosis not present

## 2020-03-10 DIAGNOSIS — M9903 Segmental and somatic dysfunction of lumbar region: Secondary | ICD-10-CM | POA: Diagnosis not present

## 2020-03-10 DIAGNOSIS — M545 Low back pain: Secondary | ICD-10-CM | POA: Diagnosis not present

## 2020-03-10 NOTE — Telephone Encounter (Signed)
I have sent her a message to see if she can come in on 03/15/20 @ 9am--waiting to hear back from her

## 2020-03-11 NOTE — Telephone Encounter (Signed)
Vanessa Lara advising that I had scheduled her an appt for Monday 03/15/20 @ 9, and I need her to let me know if she can make it or not.  I adivsed that she can reply to the MyChart message I sent her.

## 2020-03-12 ENCOUNTER — Other Ambulatory Visit: Payer: Self-pay | Admitting: Family Medicine

## 2020-03-12 ENCOUNTER — Telehealth: Payer: Self-pay | Admitting: Family Medicine

## 2020-03-12 ENCOUNTER — Telehealth: Payer: Self-pay | Admitting: Radiology

## 2020-03-12 DIAGNOSIS — M9902 Segmental and somatic dysfunction of thoracic region: Secondary | ICD-10-CM | POA: Diagnosis not present

## 2020-03-12 DIAGNOSIS — M545 Low back pain: Secondary | ICD-10-CM | POA: Diagnosis not present

## 2020-03-12 DIAGNOSIS — M9903 Segmental and somatic dysfunction of lumbar region: Secondary | ICD-10-CM | POA: Diagnosis not present

## 2020-03-12 DIAGNOSIS — M546 Pain in thoracic spine: Secondary | ICD-10-CM | POA: Diagnosis not present

## 2020-03-12 MED ORDER — LORAZEPAM 0.5 MG PO TABS: 0.2500 mg | ORAL_TABLET | Freq: Three times a day (TID) | ORAL | 0 refills | Status: DC | PRN

## 2020-03-12 NOTE — Addendum Note (Signed)
Addended by: Hortencia Pilar on: 03/12/2020 08:49 AM   Modules accepted: Orders

## 2020-03-12 NOTE — Telephone Encounter (Signed)
I left voicemail for patient advising. I also advised that My Chart message has been sent and asked patient to refer to that.

## 2020-03-12 NOTE — Telephone Encounter (Signed)
Ativan Rx sent.  Note sent to patient through [facility].

## 2020-03-12 NOTE — Telephone Encounter (Signed)
I called and lmom that hopefully she went to the ER or to an Urgent care to be evaluated. If she needs pur office she call back Monday.

## 2020-03-12 NOTE — Telephone Encounter (Signed)
Patient called this morning and spoke with Gwinda Passe, she was advsied to go to the Urgent care, then she called back and  a message was sent back, that she wanted a Chest xray to be done @ [facility]----please see other message

## 2020-03-12 NOTE — Telephone Encounter (Signed)
Per Dr. Junius Roads pt needs to go to an urgent care. LVM for pt to call back to inform.

## 2020-03-12 NOTE — Telephone Encounter (Signed)
Patient called.   She wanted me to let her providers know that she uploaded a new document into her MyChart to be looked at. She is also requesting imaging for her chest be done at [facility] because she is coughing up a thick white foam.   Call back: [phone]

## 2020-03-12 NOTE — Telephone Encounter (Signed)
Patient called triage line this morning stating was having a possible atypical reaction to medication and that "something is not right".  She is having difficulty sleeping, is miserable, and feels that she is having panic attacks. She thinks that she is having this reaction due to still having high dose of steroids in her system. She states that she has been taking the Celebrex x 2 weeks, but it has "not calmed me down yet".  Unfortunately, there are no available appointments today.  She does have an appointment on Monday but does not feel that she can go through this until then.  I instructed patient, per Dr. Junius Roads, that she could go to a [facility]. She requests note be sent to you and see if there is something that else that you may prescribe to help her?  CB [phone]

## 2020-03-15 ENCOUNTER — Ambulatory Visit: Payer: BC Managed Care – PPO

## 2020-03-15 ENCOUNTER — Telehealth: Payer: Self-pay | Admitting: Family Medicine

## 2020-03-15 ENCOUNTER — Encounter: Payer: Self-pay | Admitting: *Deleted

## 2020-03-15 DIAGNOSIS — M545 Low back pain: Secondary | ICD-10-CM | POA: Diagnosis not present

## 2020-03-15 DIAGNOSIS — M9903 Segmental and somatic dysfunction of lumbar region: Secondary | ICD-10-CM | POA: Diagnosis not present

## 2020-03-15 DIAGNOSIS — M9902 Segmental and somatic dysfunction of thoracic region: Secondary | ICD-10-CM | POA: Diagnosis not present

## 2020-03-15 DIAGNOSIS — M546 Pain in thoracic spine: Secondary | ICD-10-CM | POA: Diagnosis not present

## 2020-03-15 NOTE — Telephone Encounter (Signed)
Can take either meloxicam or celebrex, but not both together.

## 2020-03-15 NOTE — Telephone Encounter (Signed)
Patient called.   Needs to know if she should still be taking meloxicam in combination with her new rx.   Call back: [phone]

## 2020-03-15 NOTE — Telephone Encounter (Signed)
I would imagine she is probably talking about her Celebrex

## 2020-03-16 NOTE — Telephone Encounter (Signed)
LMOM for patient of the below message  

## 2020-03-17 DIAGNOSIS — M545 Low back pain: Secondary | ICD-10-CM | POA: Diagnosis not present

## 2020-03-17 DIAGNOSIS — M9903 Segmental and somatic dysfunction of lumbar region: Secondary | ICD-10-CM | POA: Diagnosis not present

## 2020-03-17 DIAGNOSIS — M546 Pain in thoracic spine: Secondary | ICD-10-CM | POA: Diagnosis not present

## 2020-03-17 DIAGNOSIS — M9902 Segmental and somatic dysfunction of thoracic region: Secondary | ICD-10-CM | POA: Diagnosis not present

## 2020-03-19 ENCOUNTER — Other Ambulatory Visit: Payer: Self-pay

## 2020-03-19 ENCOUNTER — Ambulatory Visit (INDEPENDENT_AMBULATORY_CARE_PROVIDER_SITE_OTHER): Payer: BC Managed Care – PPO | Admitting: Family Medicine

## 2020-03-19 ENCOUNTER — Encounter: Payer: Self-pay | Admitting: Family Medicine

## 2020-03-19 VITALS — BP 113/62 | HR 108 | Ht 60.0 in | Wt 88.0 lb

## 2020-03-19 DIAGNOSIS — R739 Hyperglycemia, unspecified: Secondary | ICD-10-CM | POA: Diagnosis not present

## 2020-03-19 DIAGNOSIS — R634 Abnormal weight loss: Secondary | ICD-10-CM

## 2020-03-19 DIAGNOSIS — R300 Dysuria: Secondary | ICD-10-CM | POA: Diagnosis not present

## 2020-03-19 DIAGNOSIS — M546 Pain in thoracic spine: Secondary | ICD-10-CM

## 2020-03-19 DIAGNOSIS — M0579 Rheumatoid arthritis with rheumatoid factor of multiple sites without organ or systems involvement: Secondary | ICD-10-CM | POA: Diagnosis not present

## 2020-03-19 DIAGNOSIS — R600 Localized edema: Secondary | ICD-10-CM | POA: Diagnosis not present

## 2020-03-19 DIAGNOSIS — E785 Hyperlipidemia, unspecified: Secondary | ICD-10-CM

## 2020-03-19 MED ORDER — CELECOXIB 50 MG PO CAPS
50.0000 mg | ORAL_CAPSULE | Freq: Two times a day (BID) | ORAL | 6 refills | Status: DC | PRN
Start: 2020-03-19 — End: 2020-06-01

## 2020-03-19 NOTE — Progress Notes (Signed)
Office Visit Note   Patient: Vanessa Lara           Date of Birth: 08/20/1956           MRN: [id number] Visit Date: 03/19/2020 Requested by: Eunice Blase, MD [address] [place],  [place] [address] PCP: Eunice Blase, MD  Subjective: Chief Complaint  Patient presents with  . Lower Back - Follow-up    HPI: She is here with multiple concerns today.  She has had a strange reaction to prednisone.  She is finally starting to feel somewhat better, but is still not quite back to normal.  She is having urinary symptoms.  No fevers or chills.  She is having edema in her legs and is concerned about possible heart failure.  She had some tightness in her chest when she was having thoracic pain.  She still having right-sided thoracic pain but the left side is better with chiropractic.  She is losing weight, losing her appetite.  Normally she weighs about 95 pounds, today she is 88 pounds.                ROS:   All other systems were reviewed and are negative.  Objective: Vital Signs: BP 113/62   Pulse (!) 108   Ht 5' (1.524 m)   Wt 88 lb (39.9 kg)   BMI 17.19 kg/m   Physical Exam:  General:  Alert and oriented, in no acute distress. Pulm:  Breathing unlabored. Psy:  Normal mood, congruent affect.  CV: Regular rate and rhythm without murmurs, rubs, or gallops.  1+ peripheral edema.  2+ radial and posterior tibial pulses. Lungs: Clear to auscultation throughout with no wheezing or areas of consolidation. Thoracic: She has a tender trigger point to the right of midline at around T8-10.  This seems to reproduce her pain.    Imaging: No results found.  Assessment & Plan: 1.  Thoracic pain, suspect myofascial. -Physical therapy at [facility] in [place].  2.  Dysuria -Urinalysis today.  3.  Unintentional weight loss -If symptoms do not start improving, we will do some additional work-up.  4.  Bilateral leg edema -She is using Lasix as needed.  I was going to  order BNP but we cannot collect frozen samples here.  May need to do it at the hospital if symptoms worsen.     Procedures: No procedures performed  No notes on file     PMFS History: Patient Active Problem List   Diagnosis Date Noted  . Primary osteoarthritis involving multiple joints 12/31/2018  . Rheumatoid arthritis involving multiple sites with positive rheumatoid factor ([place]) 12/31/2018  . Irritable bowel syndrome with both constipation and diarrhea 10/28/2018  . Constipation 03/14/2016  . Mitral valve prolapse 11/23/2015  . High cholesterol 11/23/2015   Past Medical History:  Diagnosis Date  . Arthritis   . Depression   . Endometriosis   . High cholesterol   . IBS (irritable bowel syndrome)   . Mitral valve prolapse   . Uterus, adenomyosis     Family History  Problem Relation Age of Onset  . Colon cancer Other   . Breast cancer Sister     Past Surgical History:  Procedure Laterality Date  . COLONOSCOPY    . COLONOSCOPY N/A 12/13/2015   Procedure: COLONOSCOPY;  Surgeon: Rogene Houston, MD;  [facility];  Service: Endoscopy;  Laterality: N/A;  9:55  . Fatty tumor     2011 (left arm)  .  TONSILLECTOMY    . TOTAL ABDOMINAL HYSTERECTOMY     2003   Social History   Occupational History  . Not on file  Tobacco Use  . Smoking status: Former Smoker    Packs/day: 0.50    Years: 33.00    Pack years: 16.50    Types: Cigarettes  . Smokeless tobacco: Never Used  . Tobacco comment: quit 3 yrs ago  Vaping Use  . Vaping Use: Never used  Substance and Sexual Activity  . Alcohol use: No    Alcohol/week: 0.0 standard drinks  . Drug use: No  . Sexual activity: Not on file

## 2020-03-19 NOTE — Addendum Note (Signed)
Addended by: Hortencia Pilar on: 03/19/2020 05:43 PM   Modules accepted: Orders

## 2020-03-20 LAB — URINALYSIS, ROUTINE W REFLEX MICROSCOPIC
Bacteria, UA: NONE SEEN /HPF
Bilirubin Urine: NEGATIVE
Glucose, UA: NEGATIVE
Hgb urine dipstick: NEGATIVE
Ketones, ur: NEGATIVE
Nitrite: NEGATIVE
Protein, ur: NEGATIVE
RBC / HPF: NONE SEEN /HPF (ref 0–2)
Specific Gravity, Urine: 1.014 (ref 1.001–1.03)
pH: 5.5 (ref 5.0–8.0)

## 2020-03-22 ENCOUNTER — Telehealth: Payer: Self-pay | Admitting: Family Medicine

## 2020-03-22 ENCOUNTER — Other Ambulatory Visit: Payer: Self-pay

## 2020-03-22 ENCOUNTER — Ambulatory Visit: Payer: BC Managed Care – PPO

## 2020-03-22 DIAGNOSIS — E785 Hyperlipidemia, unspecified: Secondary | ICD-10-CM

## 2020-03-22 LAB — CBC WITH DIFFERENTIAL/PLATELET
Absolute Monocytes: 786 cells/uL (ref 200–950)
Basophils Absolute: 66 cells/uL (ref 0–200)
Basophils Relative: 0.5 %
Eosinophils Absolute: 118 cells/uL (ref 15–500)
Eosinophils Relative: 0.9 %
HCT: 33.7 % — ABNORMAL LOW (ref 35.0–45.0)
Hemoglobin: 11.4 g/dL — ABNORMAL LOW (ref 11.7–15.5)
Lymphs Abs: 3563 cells/uL (ref 850–3900)
MCH: 28.3 pg (ref 27.0–33.0)
MCHC: 33.8 g/dL (ref 32.0–36.0)
MCV: 83.6 fL (ref 80.0–100.0)
MPV: 10.2 fL (ref 7.5–12.5)
Monocytes Relative: 6 %
Neutro Abs: 8567 cells/uL — ABNORMAL HIGH (ref 1500–7800)
Neutrophils Relative %: 65.4 %
Platelets: 431 10*3/uL — ABNORMAL HIGH (ref 140–400)
RBC: 4.03 10*6/uL (ref 3.80–5.10)
RDW: 13.6 % (ref 11.0–15.0)
Total Lymphocyte: 27.2 %
WBC: 13.1 10*3/uL — ABNORMAL HIGH (ref 3.8–10.8)

## 2020-03-22 LAB — COMPREHENSIVE METABOLIC PANEL
AG Ratio: 1.5 (calc) (ref 1.0–2.5)
ALT: 8 U/L (ref 6–29)
AST: 18 U/L (ref 10–35)
Albumin: 4.4 g/dL (ref 3.6–5.1)
Alkaline phosphatase (APISO): 140 U/L (ref 37–153)
BUN: 11 mg/dL (ref 7–25)
CO2: 27 mmol/L (ref 20–32)
Calcium: 9.5 mg/dL (ref 8.6–10.4)
Chloride: 102 mmol/L (ref 98–110)
Creat: 0.73 mg/dL (ref 0.50–0.99)
Globulin: 2.9 g/dL (calc) (ref 1.9–3.7)
Glucose, Bld: 87 mg/dL (ref 65–99)
Potassium: 4 mmol/L (ref 3.5–5.3)
Sodium: 140 mmol/L (ref 135–146)
Total Bilirubin: 0.3 mg/dL (ref 0.2–1.2)
Total Protein: 7.3 g/dL (ref 6.1–8.1)

## 2020-03-22 LAB — IRON,TIBC AND FERRITIN PANEL
%SAT: 10 % (calc) — ABNORMAL LOW (ref 16–45)
Ferritin: 7 ng/mL — ABNORMAL LOW (ref 16–288)
Iron: 38 ug/dL — ABNORMAL LOW (ref 45–160)
TIBC: 390 mcg/dL (calc) (ref 250–450)

## 2020-03-22 LAB — HEMOGLOBIN A1C
Hgb A1c MFr Bld: 5.7 % of total Hgb — ABNORMAL HIGH (ref <5.7)
Mean Plasma Glucose: 117 (calc)
eAG (mmol/L): 6.5 (calc)

## 2020-03-22 LAB — BRAIN NATRIURETIC PEPTIDE

## 2020-03-22 LAB — RHEUMATOID FACTOR: Rheumatoid fact SerPl-aCnc: 24 IU/mL — ABNORMAL HIGH (ref <14)

## 2020-03-22 NOTE — Progress Notes (Unsigned)
Three-view x-rays of the right wrist reveal moderate to severe degenerative change at the radius scaphoid joint, first CMC joint.  There is subchondral cystic change in the distal radius and in the lunate.  There is some widening of the scapholunate joint.  Alignment of the wrist is anatomic.

## 2020-03-22 NOTE — Progress Notes (Signed)
Patient came in today for fasting lipid profile - done. She asked also if I would check her weight and BP. Her weight was 88 lbs on Friday - she had swelling in both legs (not pitting edema, though). She took Lasix 20 mg qd x 3 days and her legs are better today. Weight today was 86.2 lbs and her BP was 98/62 with a pulse of 115. Per Dr. Junius Roads, she can continue to take the Lasix prn, but not every day. Will call her with the results of her labs once available.

## 2020-03-22 NOTE — Telephone Encounter (Signed)
Patient called.  She said that she is able to see that Dr.Hilts sent her a message on MyChart regarding her test results but she wants to know if the actual results can be emailed to her.

## 2020-03-22 NOTE — Telephone Encounter (Signed)
Labs show:  Could not run the BNP test (a test for heart failure) because we don't have ability to freeze the sample.  Will need to get this done at [facility].  Rheumatoid factor is slightly better than last time.  Iron studies show iron-deficiency.  Ferritin is low.  Hemoglobin is also slightly low.    Hemoglobin A1C is now at pre-diabetes range.

## 2020-03-22 NOTE — Telephone Encounter (Signed)
Urine looks mostly normal.

## 2020-03-23 ENCOUNTER — Telehealth: Payer: Self-pay | Admitting: Family Medicine

## 2020-03-23 ENCOUNTER — Telehealth: Payer: Self-pay | Admitting: *Deleted

## 2020-03-23 LAB — LIPID PANEL
Cholesterol: 171 mg/dL (ref <200)
HDL: 52 mg/dL (ref >=50)
LDL Cholesterol (Calc): 96 mg/dL (calc)
Non-HDL Cholesterol (Calc): 119 mg/dL (calc) (ref <130)
Total CHOL/HDL Ratio: 3.3 (calc) (ref <5.0)
Triglycerides: 132 mg/dL (ref <150)

## 2020-03-23 NOTE — Telephone Encounter (Signed)
-----   Message from Marlyne Beards, [place] sent at 03/22/2020 12:38 PM EDT ----- Regarding: FW: X-Ray note dictated for 02-12-20 I am also confused. She came in here today for bloodwork and said she had not heard anything about her plain wrist xray, nor the MRI - she does not know it has been authorized, apparently. She seldom checks her MyChart - I always have to call her. ----- Message ----- From: Maureen Chatters, CMA Sent: 03/22/2020  11:27 AM EDT To: Marlyne Beards, CMA Subject: RE: X-Ray note dictated for 02-12-20            Ok, I'm confused, pt is already approved for the MRI, pt just needs to go and have it done. I have sent message to her thru mychart asking her if she has had it done and if so where, I havent heard back from her.  ----- Message ----- From: Marlyne Beards, CMA Sent: 03/22/2020  10:42 AM EDT To: Maureen Chatters, CMA Subject: FW: X-Ray note dictated for 02-12-20            Somehow this has slipped through the cracks. Dr. Junius Roads has now dictated a note with the results of the patient's wrist xray. Can this be submitted now so that the patient can hopefully get her wrist MRI authorized? ----- Message ----- From: Eunice Blase, MD Sent: 03/22/2020  10:17 AM EDT To: Marlyne Beards, CMA Subject: X-Ray note dictated for 02-12-20

## 2020-03-23 NOTE — Telephone Encounter (Signed)
Lipids look great

## 2020-03-23 NOTE — Telephone Encounter (Signed)
Spoke with pt and she is aware her order was sent to [facility] imaging and they will be the ones to call to scehdule appt. I saw where in May imaging has tried calling her couple times with no response from pt, I gave pt their number and she will be the one to call to scehdule appt.

## 2020-03-24 NOTE — Telephone Encounter (Signed)
I called the patient and went over all her recent lab results. She said she cannot take oral iron (OTC nor Rx) - it causes much GI distress (not just constipation). She has had to have iron infusions in the past, for this reason. Second, the patient would like to know if she should just continue on her Lipitor 10 mg qd? And third, the patient would like to have the BNP done at [place] on [address] in [place] - I told her we would fax the order for her.

## 2020-03-25 ENCOUNTER — Other Ambulatory Visit: Payer: Self-pay

## 2020-03-25 ENCOUNTER — Telehealth: Payer: Self-pay | Admitting: Family Medicine

## 2020-03-25 ENCOUNTER — Other Ambulatory Visit: Payer: Self-pay | Admitting: Family Medicine

## 2020-03-25 ENCOUNTER — Other Ambulatory Visit: Payer: Self-pay | Admitting: Radiology

## 2020-03-25 DIAGNOSIS — R600 Localized edema: Secondary | ICD-10-CM

## 2020-03-25 DIAGNOSIS — D509 Iron deficiency anemia, unspecified: Secondary | ICD-10-CM

## 2020-03-25 NOTE — Progress Notes (Signed)
BN

## 2020-03-25 NOTE — Telephone Encounter (Signed)
Will request referral to hematology for possible infusions.  Could try taking lipitor 10 mg every other day if she'd like.

## 2020-03-25 NOTE — Telephone Encounter (Signed)
Patient called.   Notifying us that she got her MRI scheduled for July 14th at [facility]   Call back: [phone]

## 2020-03-25 NOTE — Telephone Encounter (Signed)
I called and advised the patient. Also, faxed lab order to [facility] - the patient will call and schedule own lab appointment.

## 2020-03-25 NOTE — Telephone Encounter (Signed)
Noted already aware

## 2020-03-26 ENCOUNTER — Telehealth: Payer: Self-pay | Admitting: Family Medicine

## 2020-03-26 DIAGNOSIS — M9902 Segmental and somatic dysfunction of thoracic region: Secondary | ICD-10-CM | POA: Diagnosis not present

## 2020-03-26 DIAGNOSIS — M9903 Segmental and somatic dysfunction of lumbar region: Secondary | ICD-10-CM | POA: Diagnosis not present

## 2020-03-26 DIAGNOSIS — R600 Localized edema: Secondary | ICD-10-CM | POA: Diagnosis not present

## 2020-03-26 DIAGNOSIS — M546 Pain in thoracic spine: Secondary | ICD-10-CM | POA: Diagnosis not present

## 2020-03-26 DIAGNOSIS — M545 Low back pain: Secondary | ICD-10-CM | POA: Diagnosis not present

## 2020-03-26 NOTE — Telephone Encounter (Signed)
I called and left a voice mail. We cannot do infusions in this office, which is why she is being referred to hematology. She had already said she cannot take iron orally.

## 2020-03-26 NOTE — Telephone Encounter (Signed)
Patient called requesting a call back from Dr. Junius Roads nurse Karna Christmas K. Patient wants to know if Dr. Junius Roads can treat her for iron deficiency? Please call patient back concerning this matter. Patient phone number is [phone].

## 2020-03-27 LAB — BRAIN NATRIURETIC PEPTIDE: BNP: 14.9 pg/mL (ref 0.0–100.0)

## 2020-03-29 ENCOUNTER — Telehealth: Payer: Self-pay | Admitting: Family Medicine

## 2020-03-29 NOTE — Telephone Encounter (Signed)
Order has been sent to [facility] oncology, she can call [phone] if she likes.

## 2020-03-29 NOTE — Telephone Encounter (Signed)
Pt called wanting a CB with the name and number for the oncology referral put in on 03/25/20.  [phone]

## 2020-03-29 NOTE — Telephone Encounter (Signed)
BNP (heart failure marker) is normal.

## 2020-03-29 NOTE — Telephone Encounter (Signed)
Patient called upset regarding the referral that Dr.Hilts was to send to the [facility]. She states she was told by Terri that the referral department would be able to give her the number and information on who she need to call to make the appointment. Pt request a call back asap

## 2020-03-29 NOTE — Telephone Encounter (Signed)
I have spoken with the patient. She says she has called that number today and spoke with "Benjie Karvonen," who told her she could get this done sooner at Short Stay at [facility]. The patient said Benjie Karvonen would be sending a fax to Dr. Junius Roads to refer her for this. Will await that fax.

## 2020-03-29 NOTE — Telephone Encounter (Signed)
She has a hematology referral in the system, to get iron infusions for her iron deficiency anemia. The patient wants to call that department to schedule her own appointment. I need your help on this one.

## 2020-03-30 ENCOUNTER — Telehealth: Payer: Self-pay | Admitting: Family Medicine

## 2020-03-30 MED ORDER — DIAZEPAM 5 MG PO TABS: 2.5000 mg | ORAL_TABLET | Freq: Three times a day (TID) | ORAL | 0 refills | Status: DC | PRN

## 2020-03-30 NOTE — Telephone Encounter (Signed)
Spoke with patient advised [place] [phone] Advised looked like her referral was being processed/review due to someone made note today that "patient lives in [place]" so should be receiving call to schedule once it is reviewed.

## 2020-03-30 NOTE — Telephone Encounter (Signed)
Can you please help patient with this

## 2020-03-30 NOTE — Telephone Encounter (Signed)
Please advise 

## 2020-03-30 NOTE — Addendum Note (Signed)
Addended by: Hortencia Pilar on: 03/30/2020 05:18 PM   Modules accepted: Orders

## 2020-03-30 NOTE — Telephone Encounter (Signed)
Sent!

## 2020-03-30 NOTE — Telephone Encounter (Signed)
Patient called.   She is requesting she be given [place]   Call back: [phone]

## 2020-04-01 DIAGNOSIS — M9902 Segmental and somatic dysfunction of thoracic region: Secondary | ICD-10-CM | POA: Diagnosis not present

## 2020-04-01 DIAGNOSIS — M545 Low back pain: Secondary | ICD-10-CM | POA: Diagnosis not present

## 2020-04-01 DIAGNOSIS — M9903 Segmental and somatic dysfunction of lumbar region: Secondary | ICD-10-CM | POA: Diagnosis not present

## 2020-04-01 DIAGNOSIS — M546 Pain in thoracic spine: Secondary | ICD-10-CM | POA: Diagnosis not present

## 2020-04-01 NOTE — Telephone Encounter (Signed)
Did not receive any faxes from short stay. I left this info on the  patient's voice mail. Per the chart, she does have an appointment set with the hematologist for 04/09/20. The patient has an appointment with Dr. Junius Roads in the morning - can follow up on any questions/issues the patient may have, at that time.

## 2020-04-02 ENCOUNTER — Other Ambulatory Visit: Payer: Self-pay

## 2020-04-02 ENCOUNTER — Ambulatory Visit (INDEPENDENT_AMBULATORY_CARE_PROVIDER_SITE_OTHER): Payer: BC Managed Care – PPO | Admitting: Family Medicine

## 2020-04-02 ENCOUNTER — Other Ambulatory Visit: Payer: Self-pay | Admitting: Family Medicine

## 2020-04-02 ENCOUNTER — Encounter: Payer: Self-pay | Admitting: Family Medicine

## 2020-04-02 VITALS — BP 103/59 | HR 113 | Wt 87.6 lb

## 2020-04-02 DIAGNOSIS — M25532 Pain in left wrist: Secondary | ICD-10-CM | POA: Diagnosis not present

## 2020-04-02 DIAGNOSIS — M2550 Pain in unspecified joint: Secondary | ICD-10-CM

## 2020-04-02 DIAGNOSIS — Z808 Family history of malignant neoplasm of other organs or systems: Secondary | ICD-10-CM

## 2020-04-02 NOTE — Progress Notes (Signed)
The patient states her MRI coming up is for her right wrist. She said it needs to be on her left wrist, instead. Her right shoulder pops and has decreased ROM. She asks if she should see Dr. Ninfa Linden again for the shoulder.

## 2020-04-02 NOTE — Progress Notes (Signed)
Office Visit Note   Patient: Vanessa Lara           Date of Birth: 1956-04-19           MRN: [id number] Visit Date: 04/02/2020 Requested by: Eunice Blase, MD [address] [place],   [address] PCP: Eunice Blase, MD  Subjective: Chief Complaint  Patient presents with  . multiple joint pains    HPI: She is here with multiple areas of pain.  We have her scheduled for MRI of her right wrist, but since then her left wrist has started to bother her much more.  We have seen her for this in 2019 and had x-rays obtained at that point.  She was given a cortisone injection as well.  It hurts on the dorsum.  She is also hurting in her shoulders and her hips.  She feels that Celebrex helps all of these pains, but her primary concern is that she has a family history of bone cancer in her mother that came on all of a sudden.  Patient has continued to lose weight and is down another pound since last visit.  She is scheduled for iron infusion in the near future.               ROS:   All other systems were reviewed and are negative.  Objective: Vital Signs: BP (!) 103/59 (BP Location: Left Arm)   Pulse (!) 113   Wt 87 lb 9.6 oz (39.7 kg)   BMI 17.11 kg/m   Physical Exam:  General:  Alert and oriented, in no acute distress. Pulm:  Breathing unlabored. Psy:  Normal mood, congruent affect.  Shoulder still has good range of motion.  Wrist is tender on the dorsum of the scapholunate area on the left.    Imaging: No results found.  Assessment & Plan: 1.  Multiple joint pain, probably due to rheumatoid arthritis.  Family history of bone cancer. -We will schedule three-phase bone scan to further evaluate.  2.  Left greater than right wrist pain -We will try to switch her MRI scans from the right wrist to the left wrist.     Procedures: No procedures performed  No notes on file     PMFS History: Patient Active Problem List   Diagnosis Date Noted  . Primary  osteoarthritis involving multiple joints 12/31/2018  . Rheumatoid arthritis involving multiple sites with positive rheumatoid factor ([place]) 12/31/2018  . Irritable bowel syndrome with both constipation and diarrhea 10/28/2018  . Constipation 03/14/2016  . Mitral valve prolapse 11/23/2015  . High cholesterol 11/23/2015   Past Medical History:  Diagnosis Date  . Arthritis   . Depression   . Endometriosis   . High cholesterol   . IBS (irritable bowel syndrome)   . Mitral valve prolapse   . Uterus, adenomyosis     Family History  Problem Relation Age of Onset  . Colon cancer Other   . Breast cancer Sister     Past Surgical History:  Procedure Laterality Date  . COLONOSCOPY    . COLONOSCOPY N/A 12/13/2015   Procedure: COLONOSCOPY;  Surgeon: Rogene Houston, MD;  Location: [facility];  Service: Endoscopy;  Laterality: N/A;  9:55  . Fatty tumor     2011 (left arm)  . TONSILLECTOMY    . TOTAL ABDOMINAL HYSTERECTOMY     2003   Social History   Occupational History  . Not on file  Tobacco Use  . Smoking status: Former  Smoker    Packs/day: 0.50    Years: 33.00    Pack years: 16.50    Types: Cigarettes  . Smokeless tobacco: Never Used  . Tobacco comment: quit 3 yrs ago  Vaping Use  . Vaping Use: Never used  Substance and Sexual Activity  . Alcohol use: No    Alcohol/week: 0.0 standard drinks  . Drug use: No  . Sexual activity: Not on file

## 2020-04-02 NOTE — Progress Notes (Signed)
Changed order to left wrist vs right wrist

## 2020-04-06 ENCOUNTER — Other Ambulatory Visit: Payer: Self-pay | Admitting: Family Medicine

## 2020-04-06 MED ORDER — ATORVASTATIN CALCIUM 10 MG PO TABS: 10.0000 mg | ORAL_TABLET | Freq: Every day | ORAL | 1 refills | Status: DC

## 2020-04-06 MED ORDER — PAROXETINE HCL 10 MG PO TABS: 10.0000 mg | ORAL_TABLET | Freq: Every day | ORAL | 1 refills | Status: DC

## 2020-04-09 ENCOUNTER — Inpatient Hospital Stay (HOSPITAL_COMMUNITY): Payer: BC Managed Care – PPO

## 2020-04-09 ENCOUNTER — Inpatient Hospital Stay (HOSPITAL_COMMUNITY): Payer: BC Managed Care – PPO | Attending: Hematology | Admitting: Hematology

## 2020-04-09 ENCOUNTER — Other Ambulatory Visit (HOSPITAL_COMMUNITY): Payer: BC Managed Care – PPO

## 2020-04-09 ENCOUNTER — Other Ambulatory Visit: Payer: Self-pay

## 2020-04-09 ENCOUNTER — Encounter (HOSPITAL_COMMUNITY): Payer: Self-pay | Admitting: Hematology

## 2020-04-09 VITALS — BP 107/62 | HR 117 | Temp 98.1°F | Resp 16 | Ht 60.0 in | Wt 86.0 lb

## 2020-04-09 DIAGNOSIS — D649 Anemia, unspecified: Secondary | ICD-10-CM | POA: Diagnosis not present

## 2020-04-09 DIAGNOSIS — Z13820 Encounter for screening for osteoporosis: Secondary | ICD-10-CM | POA: Diagnosis not present

## 2020-04-09 DIAGNOSIS — M810 Age-related osteoporosis without current pathological fracture: Secondary | ICD-10-CM | POA: Insufficient documentation

## 2020-04-09 DIAGNOSIS — D509 Iron deficiency anemia, unspecified: Secondary | ICD-10-CM | POA: Insufficient documentation

## 2020-04-09 DIAGNOSIS — Z79899 Other long term (current) drug therapy: Secondary | ICD-10-CM | POA: Diagnosis not present

## 2020-04-09 DIAGNOSIS — M069 Rheumatoid arthritis, unspecified: Secondary | ICD-10-CM | POA: Insufficient documentation

## 2020-04-09 LAB — LACTATE DEHYDROGENASE: LDH: 153 U/L (ref 98–192)

## 2020-04-09 LAB — RETICULOCYTES
Immature Retic Fract: 10.2 % (ref 2.3–15.9)
RBC.: 3.88 MIL/uL (ref 3.87–5.11)
Retic Count, Absolute: 31.4 10*3/uL (ref 19.0–186.0)
Retic Ct Pct: 0.8 % (ref 0.4–3.1)

## 2020-04-09 LAB — SAVE SMEAR(SSMR), FOR PROVIDER SLIDE REVIEW

## 2020-04-09 LAB — FOLATE: Folate: 8.4 ng/mL (ref >5.9)

## 2020-04-09 LAB — VITAMIN B12: Vitamin B-12: 185 pg/mL (ref 180–914)

## 2020-04-09 LAB — VITAMIN D 25 HYDROXY (VIT D DEFICIENCY, FRACTURES): Vit D, 25-Hydroxy: 56.6 ng/mL (ref 30–100)

## 2020-04-09 NOTE — Progress Notes (Signed)
CONSULT NOTE  Patient Care Team: Eunice Blase, MD as PCP - General (Family Medicine) Herminio Commons, MD as PCP - Cardiology (Cardiology) Lahoma Rocker, MD as Consulting Physician (Rheumatology)  CHIEF COMPLAINTS/PURPOSE OF CONSULTATION: Anemia  HISTORY OF PRESENTING ILLNESS:  Vanessa Lara 64 y.o. female was sent here by her PCP for anemia.  She had labs checked about a month ago which showed her hemoglobin 11.4, ferritin 7, percent saturation 10.  Patient is unable to tolerate oral iron supplements due to severe constipation and abdominal discomfort.  Patient denies any bright red bleeding per rectum or melena.  She also has not seen any epistasis or hematuria.  She denies a history of blood clots.  She has never had to have a blood transfusion.  She denies any pica and eats a variety of diet.  Patient denies any kidney disease.  Patient reports she had a hysterectomy in 2003.  She denies any alcohol or illicit drug use.  She stopped smoking 9 years ago.  She has started a few new medications recently including Celebrex, Ativan and gabapentin.  She denies any over-the-counter NSAID ingestion.  She is not on any antiplatelet agents.  She has taken rheumatoid arthritis medication in the past and was unable to tolerate it.  She has a family history positive for father with pituitary gland cancer that metastasized to his brain.  A mother with basilar cancer that metastasized to her bone.  Also a sister with cancer in her brain with unknown primary origin.  Patient lives at home alone and performs all her own ADLs.   MEDICAL HISTORY:  Past Medical History:  Diagnosis Date   Anemia    Arthritis    Depression    Endometriosis    High cholesterol    IBS (irritable bowel syndrome)    Mitral valve prolapse    Uterus, adenomyosis     SURGICAL HISTORY: Past Surgical History:  Procedure Laterality Date   CARPAL TUNNEL RELEASE Right    COLONOSCOPY     COLONOSCOPY N/A  12/13/2015   Procedure: COLONOSCOPY;  Surgeon: Rogene Houston, MD;  Location: [facility];  Service: Endoscopy;  Laterality: N/A;  9:55   Fatty tumor     2011 (left arm)   TONSILLECTOMY     TOTAL ABDOMINAL HYSTERECTOMY     2003    SOCIAL HISTORY: Social History   Socioeconomic History   Marital status: Single    Spouse name: Not on file   Number of children: Not on file   Years of education: Not on file   Highest education level: Not on file  Occupational History   Occupation: retired  Tobacco Use   Smoking status: Former Smoker    Packs/day: 0.50    Years: 33.00    Pack years: 16.50    Types: Cigarettes   Smokeless tobacco: Never Used   Tobacco comment: quit 9 yrs ago  Vaping Use   Vaping Use: Never used  Substance and Sexual Activity   Alcohol use: No    Alcohol/week: 0.0 standard drinks   Drug use: No   Sexual activity: Not Currently  Other Topics Concern   Not on file  Social History Narrative   Not on file   Social Determinants of Health   Financial Resource Strain:    Difficulty of Paying Living Expenses:   Food Insecurity:    Worried About Estate manager/land agent of Food in the Last Year:    Arboriculturist in  the Last Year:   Transportation Needs:    Film/video editor (Medical):    Lack of Transportation (Non-Medical):   Physical Activity:    Days of Exercise per Week:    Minutes of Exercise per Session:   Stress:    Feeling of Stress :   Social Connections:    Frequency of Communication with Friends and Family:    Frequency of Social Gatherings with Friends and Family:    Attends Religious Services:    Active Member of Clubs or Organizations:    Attends Music therapist:    Marital Status:   Intimate Partner Violence:    Fear of Current or Ex-Partner:    Emotionally Abused:    Physically Abused:    Sexually Abused:     FAMILY HISTORY: Family History  Problem Relation Age of Onset   Colon  cancer Other    Bone cancer Mother    Brain cancer Father    Brain cancer Sister    Renal Disease Sister     ALLERGIES:  is allergic to methotrexate derivatives.  MEDICATIONS:  Current Outpatient Medications  Medication Sig Dispense Refill   furosemide (LASIX) 20 MG tablet Take 20 mg by mouth as needed.     gabapentin (NEURONTIN) 100 MG capsule Take 100 mg by mouth 3 (three) times daily. Pt takes as needed     traZODone (DESYREL) 50 MG tablet Take 50 mg by mouth at bedtime.     atorvastatin (LIPITOR) 10 MG tablet Take 1 tablet (10 mg total) by mouth daily. 90 tablet 1   baclofen (LIORESAL) 10 MG tablet Take 0.5-1 tablets (5-10 mg total) by mouth 3 (three) times daily as needed for muscle spasms. (Patient not taking: Reported on 04/09/2020) 30 each 3   celecoxib (CELEBREX) 50 MG capsule Take 1 capsule (50 mg total) by mouth 2 (two) times daily as needed for pain. 60 capsule 6   diazepam (VALIUM) 5 MG tablet Take 0.5-1 tablets (2.5-5 mg total) by mouth every 8 (eight) hours as needed. (Patient not taking: Reported on 04/09/2020) 30 tablet 0   dicyclomine (BENTYL) 10 MG capsule Take 1 capsule (10 mg total) by mouth 4 (four) times daily -  before meals and at bedtime. 90 capsule 3   LORazepam (ATIVAN) 0.5 MG tablet TAKE 1/2 TO 1 TABLET(0.25 TO 0.5 MG) BY MOUTH THREE TIMES DAILY AS NEEDED FOR ANXIETY 30 tablet 1   meloxicam (MOBIC) 15 MG tablet Take 0.5-1 tablets (7.5-15 mg total) by mouth daily as needed for pain. (Patient not taking: Reported on 04/09/2020) 30 tablet 6   PARoxetine (PAXIL) 10 MG tablet Take 1 tablet (10 mg total) by mouth daily. 90 tablet 1   No current facility-administered medications for this visit.    REVIEW OF SYSTEMS:   Constitutional: Denies fevers, chills or abnormal night sweats, + dizziness Respiratory: Denies dyspnea or wheezes, + cough Cardiovascular: Denies palpitation, chest discomfort or lower extremity swelling Gastrointestinal:  Denies nausea,  heartburn or change in bowel habits Skin: Denies abnormal skin rashes Lymphatics: Denies new lymphadenopathy or easy bruising Neurological:Denies  new weaknesses, + numbness in her fingers and toes Behavioral/Psych: Mood is stable, no new changes,+ anxiety and depression All other systems were reviewed with the patient and are negative.  PHYSICAL EXAMINATION: ECOG PERFORMANCE STATUS: 1 - Symptomatic but completely ambulatory  Vitals:   04/09/20 0911  BP: 107/62  Pulse: (!) 117  Resp: 16  Temp: 98.1 F (36.7 C)  SpO2: 98%  Filed Weights   04/09/20 0911  Weight: 86 lb (39 kg)    GENERAL:alert, no distress and comfortable SKIN: skin color, texture, turgor are normal, no rashes or significant lesions NECK: supple, thyroid normal size, non-tender, without nodularity LYMPH:  no palpable lymphadenopathy in the cervical, axillary or inguinal LUNGS: clear to auscultation and percussion with normal breathing effort HEART: regular rate & rhythm and no murmurs and no lower extremity edema ABDOMEN:abdomen soft, non-tender and normal bowel sounds Musculoskeletal:no cyanosis of digits and no clubbing  PSYCH: alert & oriented x 3 with fluent speech NEURO: no focal motor/sensory deficits  LABORATORY DATA:  I have reviewed the data as listed Recent Results (from the past 2160 hour(s))  Urinalysis, Routine w reflex microscopic     Status: Abnormal   Collection Time: 03/19/20  5:07 PM  Result Value Ref Range   Color, Urine YELLOW YELLOW   APPearance CLEAR CLEAR   Specific Gravity, Urine 1.014 1.001 - 1.03   pH 5.5 5.0 - 8.0   Glucose, UA NEGATIVE NEGATIVE   Bilirubin Urine NEGATIVE NEGATIVE   Ketones, ur NEGATIVE NEGATIVE   Hgb urine dipstick NEGATIVE NEGATIVE   Protein, ur NEGATIVE NEGATIVE   Nitrite NEGATIVE NEGATIVE   Leukocytes,Ua 1+ (A) NEGATIVE   WBC, UA 0-5 0 - 5 /HPF   RBC / HPF NONE SEEN 0 - 2 /HPF   Squamous Epithelial / LPF 0-5 < OR = 5 /HPF   Bacteria, UA NONE SEEN  NONE SEEN /HPF   Hyaline Cast 0-5 (A) NONE SEEN /LPF  Rheumatoid factor     Status: Abnormal   Collection Time: 03/19/20  5:07 PM  Result Value Ref Range   Rhuematoid fact SerPl-aCnc 24 (H) <14 IU/mL  Iron, TIBC and Ferritin Panel     Status: Abnormal   Collection Time: 03/19/20  5:07 PM  Result Value Ref Range   Iron 38 (L) 45 - 160 mcg/dL   TIBC 390 250 - 450 mcg/dL (calc)   %SAT 10 (L) 16 - 45 % (calc)   Ferritin 7 (L) 16 - 288 ng/mL  Hemoglobin A1c     Status: Abnormal   Collection Time: 03/19/20  5:07 PM  Result Value Ref Range   Hgb A1c MFr Bld 5.7 (H) <5.7 % of total Hgb    Comment: For someone without known diabetes, a hemoglobin  A1c value between 5.7% and 6.4% is consistent with prediabetes and should be confirmed with a  follow-up test. . For someone with known diabetes, a value <7% indicates that their diabetes is well controlled. A1c targets should be individualized based on duration of diabetes, age, comorbid conditions, and other considerations. . This assay result is consistent with an increased risk of diabetes. . Currently, no consensus exists regarding use of hemoglobin A1c for diagnosis of diabetes for children. .    Mean Plasma Glucose 117 (calc)   eAG (mmol/L) 6.5 (calc)  Comprehensive metabolic panel     Status: None   Collection Time: 03/19/20  5:07 PM  Result Value Ref Range   Glucose, Bld 87 65 - 99 mg/dL    Comment: .            Fasting reference interval .    BUN 11 7 - 25 mg/dL   Creat 0.73 0.50 - 0.99 mg/dL    Comment: For patients >72 years of age, the reference limit for Creatinine is approximately 13% higher for people identified as African-American. .    BUN/Creatinine Ratio NOT  APPLICABLE 6 - 22 (calc)   Sodium 140 135 - 146 mmol/L   Potassium 4.0 3.5 - 5.3 mmol/L   Chloride 102 98 - 110 mmol/L   CO2 27 20 - 32 mmol/L   Calcium 9.5 8.6 - 10.4 mg/dL   Total Protein 7.3 6.1 - 8.1 g/dL   Albumin 4.4 3.6 - 5.1 g/dL   Globulin  2.9 1.9 - 3.7 g/dL (calc)   AG Ratio 1.5 1.0 - 2.5 (calc)   Total Bilirubin 0.3 0.2 - 1.2 mg/dL   Alkaline phosphatase (APISO) 140 37 - 153 U/L   AST 18 10 - 35 U/L   ALT 8 6 - 29 U/L  CBC with Differential/Platelet     Status: Abnormal   Collection Time: 03/19/20  5:07 PM  Result Value Ref Range   WBC 13.1 (H) 3.8 - 10.8 Thousand/uL   RBC 4.03 3.80 - 5.10 Million/uL   Hemoglobin 11.4 (L) 11.7 - 15.5 g/dL   HCT 33.7 (L) 35 - 45 %   MCV 83.6 80.0 - 100.0 fL   MCH 28.3 27.0 - 33.0 pg   MCHC 33.8 32.0 - 36.0 g/dL   RDW 13.6 11.0 - 15.0 %   Platelets 431 (H) 140 - 400 Thousand/uL   MPV 10.2 7.5 - 12.5 fL   Neutro Abs 8,567 (H) 1,500 - 7,800 cells/uL   Lymphs Abs 3,563 850 - 3,900 cells/uL   Absolute Monocytes 786 200 - 950 cells/uL   Eosinophils Absolute 118 15 - 500 cells/uL   Basophils Absolute 66 0 - 200 cells/uL   Neutrophils Relative % 65.4 %   Total Lymphocyte 27.2 %   Monocytes Relative 6.0 %   Eosinophils Relative 0.9 %   Basophils Relative 0.5 %  Brain natriuretic peptide     Status: None   Collection Time: 03/19/20  5:07 PM  Result Value Ref Range   Brain Natriuretic Peptide CANCELED     Comment: TEST NOT PERFORMED . No suitable specimen received. Please review the test requirements at  [email or web address]  Result canceled by the ancillary.   Lipid panel     Status: None   Collection Time: 03/22/20 10:25 AM  Result Value Ref Range   Cholesterol 171 <200 mg/dL   HDL 52 > OR = 50 mg/dL   Triglycerides 132 <150 mg/dL   LDL Cholesterol (Calc) 96 mg/dL (calc)    Comment: Reference range: <100 . Desirable range <100 mg/dL for primary prevention;   <70 mg/dL for patients with CHD or diabetic patients  with > or = 2 CHD risk factors. Marland Kitchen LDL-C is now calculated using the Martin-Hopkins  calculation, which is a validated novel method providing  better accuracy than the Friedewald equation in the  estimation of LDL-C.  Cresenciano Genre et al. Annamaria Helling.  4158;309(40): 2061-2068  (http://education.[facility].com/faq/FAQ164)    Total CHOL/HDL Ratio 3.3 <5.0 (calc)   Non-HDL Cholesterol (Calc) 119 <130 mg/dL (calc)    Comment: For patients with diabetes plus 1 major ASCVD risk  factor, treating to a non-HDL-C goal of <100 mg/dL  (LDL-C of <70 mg/dL) is considered a therapeutic  option.   B Nat Peptide     Status: None   Collection Time: 03/26/20  3:24 PM  Result Value Ref Range   BNP 14.9 0.0 - 100.0 pg/mL    RADIOGRAPHIC STUDIES: I have personally reviewed the radiological images as listed and agreed with the findings in the report. I have independently interviewed this patient and examined her.  I  agree with HPI written by my nurse practitioner Wenda Low, FNP.  I have independently formulated my assessment and plan.  ASSESSMENT & PLAN:  1.  Normocytic anemia: -CBC on 03/19/2020 shows hemoglobin 11.4 with MCV of 83.6.  She did have mildly elevated white count likely from UTI. -Ferritin was 6 with percent saturation of 10. -Denies any bleeding per rectum or melena. -Last colonoscopy on 12/13/2015 which shows normal colon with nonbleeding external hemorrhoids.  Otherwise normal exam. -CT AP on 06/13/2018 shows normal-sized spleen.  No acute findings. -We will rule out other nutritional deficiencies.  We will also check stool for occult blood. -We talked about starting her on Feraheme weekly x2.  We talked about side effects in detail. -We will plan to see her back in 6 to 8 weeks with repeat blood counts.  2.  Osteoporosis: -She reportedly was diagnosed with osteoporosis few years ago. -I will obtain a vitamin D level and a bone density test.  3.  Rheumatoid arthritis: -She is on Mobic, Celebrex.   All questions were answered. The patient knows to call the clinic with any problems, questions or concerns.      Derek Jack, MD 04/09/20 12:28 PM

## 2020-04-09 NOTE — Patient Instructions (Signed)
[place] at [facility]  Discharge Instructions:  You saw Francene Finders and Dr. Delton Coombes today. _______________________________________________________________  Thank you for choosing [place] at [facility] to provide your oncology and hematology care.  To afford each patient quality time with our providers, please arrive at least 15 minutes before your scheduled appointment.  You need to re-schedule your appointment if you arrive 10 or more minutes late.  We strive to give you quality time with our providers, and arriving late affects you and other patients whose appointments are after yours.  Also, if you no show three or more times for appointments you may be dismissed from the clinic.  Again, thank you for choosing [place] at [place] hope is that these requests will allow you access to exceptional care and in a timely manner. _______________________________________________________________  If you have questions after your visit, please contact our office at [phone] between the hours of 8:30 a.m. and 5:00 p.m. Voicemails left after 4:30 p.m. will not be returned until the following business day. _______________________________________________________________  For prescription refill requests, have your pharmacy contact our office. _______________________________________________________________  Recommendations made by the consultant and any test results will be sent to your referring physician. _______________________________________________________________

## 2020-04-10 LAB — HAPTOGLOBIN: Haptoglobin: 244 mg/dL (ref 37–355)

## 2020-04-11 DIAGNOSIS — R42 Dizziness and giddiness: Secondary | ICD-10-CM | POA: Diagnosis not present

## 2020-04-12 LAB — PROTEIN ELECTROPHORESIS, SERUM
A/G Ratio: 1.3 (ref 0.7–1.7)
Albumin ELP: 3.8 g/dL (ref 2.9–4.4)
Alpha-1-Globulin: 0.2 g/dL (ref 0.0–0.4)
Alpha-2-Globulin: 0.9 g/dL (ref 0.4–1.0)
Beta Globulin: 1 g/dL (ref 0.7–1.3)
Gamma Globulin: 0.9 g/dL (ref 0.4–1.8)
Globulin, Total: 3 g/dL (ref 2.2–3.9)
Total Protein ELP: 6.8 g/dL (ref 6.0–8.5)

## 2020-04-12 LAB — METHYLMALONIC ACID, SERUM: Methylmalonic Acid, Quantitative: 325 nmol/L (ref 0–378)

## 2020-04-13 ENCOUNTER — Other Ambulatory Visit: Payer: Self-pay

## 2020-04-13 ENCOUNTER — Encounter (HOSPITAL_COMMUNITY)
Admission: RE | Admit: 2020-04-13 | Discharge: 2020-04-13 | Disposition: A | Discharge status: Home / Self Care | Payer: BC Managed Care – PPO | Source: Ambulatory Visit | Attending: Family Medicine | Admitting: Family Medicine

## 2020-04-13 ENCOUNTER — Encounter (HOSPITAL_COMMUNITY): Payer: Self-pay

## 2020-04-13 ENCOUNTER — Ambulatory Visit (HOSPITAL_COMMUNITY)
Admission: RE | Admit: 2020-04-13 | Discharge: 2020-04-13 | Disposition: A | Discharge status: Home / Self Care | Payer: BC Managed Care – PPO | Source: Ambulatory Visit | Attending: Nurse Practitioner | Admitting: Nurse Practitioner

## 2020-04-13 DIAGNOSIS — M2550 Pain in unspecified joint: Secondary | ICD-10-CM

## 2020-04-13 DIAGNOSIS — M19031 Primary osteoarthritis, right wrist: Secondary | ICD-10-CM | POA: Diagnosis not present

## 2020-04-13 DIAGNOSIS — M25532 Pain in left wrist: Secondary | ICD-10-CM | POA: Insufficient documentation

## 2020-04-13 DIAGNOSIS — M85851 Other specified disorders of bone density and structure, right thigh: Secondary | ICD-10-CM | POA: Diagnosis not present

## 2020-04-13 DIAGNOSIS — Z13820 Encounter for screening for osteoporosis: Secondary | ICD-10-CM | POA: Diagnosis not present

## 2020-04-13 DIAGNOSIS — M19071 Primary osteoarthritis, right ankle and foot: Secondary | ICD-10-CM | POA: Diagnosis not present

## 2020-04-13 DIAGNOSIS — Z780 Asymptomatic menopausal state: Secondary | ICD-10-CM | POA: Diagnosis not present

## 2020-04-13 DIAGNOSIS — Z808 Family history of malignant neoplasm of other organs or systems: Secondary | ICD-10-CM

## 2020-04-13 DIAGNOSIS — M19032 Primary osteoarthritis, left wrist: Secondary | ICD-10-CM | POA: Diagnosis not present

## 2020-04-13 DIAGNOSIS — M19072 Primary osteoarthritis, left ankle and foot: Secondary | ICD-10-CM | POA: Diagnosis not present

## 2020-04-13 HISTORY — DX: Heart failure, unspecified: I50.9

## 2020-04-13 HISTORY — DX: Malignant (primary) neoplasm, unspecified: C80.1

## 2020-04-13 HISTORY — DX: Systemic involvement of connective tissue, unspecified: M35.9

## 2020-04-13 LAB — COPPER, SERUM: Copper: 166 ug/dL — ABNORMAL HIGH (ref 80–158)

## 2020-04-13 MED ORDER — TECHNETIUM TC 99M MEDRONATE IV KIT
20.0000 | PACK | Freq: Once | INTRAVENOUS | Status: AC | PRN
  Administered 2020-04-13: 18.8 via INTRAVENOUS

## 2020-04-14 ENCOUNTER — Ambulatory Visit (HOSPITAL_COMMUNITY): Admission: RE | Admit: 2020-04-14 | Payer: BC Managed Care – PPO | Source: Ambulatory Visit

## 2020-04-14 ENCOUNTER — Telehealth: Payer: Self-pay | Admitting: Family Medicine

## 2020-04-14 NOTE — Telephone Encounter (Signed)
I called and left this info on the patient's voice mail.

## 2020-04-14 NOTE — Telephone Encounter (Signed)
No need for wrist MRI unless she wants to consider surgery.

## 2020-04-14 NOTE — Telephone Encounter (Signed)
Please advise 

## 2020-04-14 NOTE — Telephone Encounter (Signed)
Bone scan shows arthritis in the right wrist and the left hand.  No sign of cancer.

## 2020-04-14 NOTE — Telephone Encounter (Signed)
Patient called.   Wanted to know if she needs to have her wrist scanned at her MRI still since she had the bone density test done on it.   Call back: [phone]

## 2020-04-15 ENCOUNTER — Inpatient Hospital Stay (HOSPITAL_COMMUNITY): Payer: BC Managed Care – PPO

## 2020-04-15 VITALS — BP 99/74 | HR 96 | Temp 97.2°F | Resp 18

## 2020-04-15 DIAGNOSIS — M069 Rheumatoid arthritis, unspecified: Secondary | ICD-10-CM | POA: Diagnosis not present

## 2020-04-15 DIAGNOSIS — D509 Iron deficiency anemia, unspecified: Secondary | ICD-10-CM

## 2020-04-15 DIAGNOSIS — Z79899 Other long term (current) drug therapy: Secondary | ICD-10-CM | POA: Diagnosis not present

## 2020-04-15 DIAGNOSIS — M810 Age-related osteoporosis without current pathological fracture: Secondary | ICD-10-CM | POA: Diagnosis not present

## 2020-04-15 DIAGNOSIS — D649 Anemia, unspecified: Secondary | ICD-10-CM | POA: Diagnosis not present

## 2020-04-15 MED ORDER — ACETAMINOPHEN 325 MG PO TABS: 650.0000 mg | ORAL_TABLET | Freq: Once | ORAL | Status: DC

## 2020-04-15 MED ORDER — LORATADINE 10 MG PO TABS: 10.0000 mg | ORAL_TABLET | Freq: Once | ORAL | Status: DC

## 2020-04-15 MED ORDER — SODIUM CHLORIDE 0.9 % IV SOLN: Freq: Once | INTRAVENOUS | Status: AC

## 2020-04-15 MED ORDER — SODIUM CHLORIDE 0.9 % IV SOLN
510.0000 mg | Freq: Once | INTRAVENOUS | Status: AC
  Administered 2020-04-15: 510 mg via INTRAVENOUS
  Filled 2020-04-15: qty 510

## 2020-04-15 NOTE — Progress Notes (Signed)
JAVANA SCHEY presents today for dose 1 of Feraheme. Pt reports she has been very fatigued lately and is hopeful this will improve that. Upon going over premedications with patient, she states she does not want to take the Tylenol or Claritin ordered. She states she is not supposed to take Tylenol because she is on Celebrex and that she cannot take anything like Claritin or Benadryl because it causes her to become very tachycardic. I spoke to Francene Finders, NP about this and she states to proceed with the Feraheme without premedicating. Pt made aware and made aware of signs to report immediately with feraheme infusion. Understanding verbalized.   Infusion tolerated without incident or complaint. See MAR for details. VSS prior to and post infusion. Discharged in satisfactory condition with follow up instructions.

## 2020-04-15 NOTE — Patient Instructions (Signed)
Vanessa Lara at [facility]  Discharge Instructions:  IV Feraheme received today.  Ferumoxytol injection What is this medicine? FERUMOXYTOL is an iron complex. Iron is used to make healthy red blood cells, which carry oxygen and nutrients throughout the body. This medicine is used to treat iron deficiency anemia. This medicine may be used for other purposes; ask your health care provider or pharmacist if you have questions. COMMON BRAND NAME(S): Feraheme What should I tell my health care provider before I take this medicine? They need to know if you have any of these conditions:  anemia not caused by low iron levels  high levels of iron in the blood  magnetic resonance imaging (MRI) test scheduled  an unusual or allergic reaction to iron, other medicines, foods, dyes, or preservatives  pregnant or trying to get pregnant  breast-feeding How should I use this medicine? This medicine is for injection into a vein. It is given by a health care professional in a hospital or clinic setting. Talk to your pediatrician regarding the use of this medicine in children. Special care may be needed. Overdosage: If you think you have taken too much of this medicine contact a poison control center or emergency room at once. NOTE: This medicine is only for you. Do not share this medicine with others. What if I miss a dose? It is important not to miss your dose. Call your doctor or health care professional if you are unable to keep an appointment. What may interact with this medicine? This medicine may interact with the following medications:  other iron products This list may not describe all possible interactions. Give your health care provider a list of all the medicines, herbs, non-prescription drugs, or dietary supplements you use. Also tell them if you smoke, drink alcohol, or use illegal drugs. Some items may interact with your medicine. What should I watch for while  using this medicine? Visit your doctor or healthcare professional regularly. Tell your doctor or healthcare professional if your symptoms do not start to get better or if they get worse. You may need blood work done while you are taking this medicine. You may need to follow a special diet. Talk to your doctor. Foods that contain iron include: whole grains/cereals, dried fruits, beans, or peas, leafy green vegetables, and organ meats (liver, kidney). What side effects may I notice from receiving this medicine? Side effects that you should report to your doctor or health care professional as soon as possible:  allergic reactions like skin rash, itching or hives, swelling of the face, lips, or tongue  breathing problems  changes in blood pressure  feeling faint or lightheaded, falls  fever or chills  flushing, sweating, or hot feelings  swelling of the ankles or feet Side effects that usually do not require medical attention (report to your doctor or health care professional if they continue or are bothersome):  diarrhea  headache  nausea, vomiting  stomach pain This list may not describe all possible side effects. Call your doctor for medical advice about side effects. You may report side effects to FDA at 1-800-FDA-1088. Where should I keep my medicine? This drug is given in a hospital or clinic and will not be stored at home. NOTE: This sheet is a summary. It may not cover all possible information. If you have questions about this medicine, talk to your doctor, pharmacist, or health care provider.  2020 Elsevier/Gold Standard (2016-11-06 20:21:10)  _______________________________________________________________  Thank you for choosing  [place] at [facility] to provide your oncology and hematology care.  To afford each patient quality time with our providers, please arrive at least 15 minutes before your scheduled appointment.  You need to re-schedule  your appointment if you arrive 10 or more minutes late.  We strive to give you quality time with our providers, and arriving late affects you and other patients whose appointments are after yours.  Also, if you no show three or more times for appointments you may be dismissed from the clinic.  Again, thank you for choosing [facility] at [place] hope is that these requests will allow you access to exceptional care and in a timely manner. _______________________________________________________________  If you have questions after your visit, please contact our office at [phone] between the hours of 8:30 a.m. and 5:00 p.m. Voicemails left after 4:30 p.m. will not be returned until the following business day. _______________________________________________________________  For prescription refill requests, have your pharmacy contact our office. _______________________________________________________________  Recommendations made by the consultant and any test results will be sent to your referring physician. _______________________________________________________________

## 2020-04-16 ENCOUNTER — Ambulatory Visit: Payer: BC Managed Care – PPO | Admitting: Family Medicine

## 2020-04-20 ENCOUNTER — Other Ambulatory Visit: Payer: Self-pay

## 2020-04-20 ENCOUNTER — Telehealth: Payer: Self-pay | Admitting: Family Medicine

## 2020-04-20 ENCOUNTER — Ambulatory Visit (INDEPENDENT_AMBULATORY_CARE_PROVIDER_SITE_OTHER): Payer: BC Managed Care – PPO | Admitting: Family Medicine

## 2020-04-20 ENCOUNTER — Encounter: Payer: Self-pay | Admitting: Family Medicine

## 2020-04-20 VITALS — BP 111/67 | HR 87 | Wt 85.2 lb

## 2020-04-20 DIAGNOSIS — M0579 Rheumatoid arthritis with rheumatoid factor of multiple sites without organ or systems involvement: Secondary | ICD-10-CM

## 2020-04-20 DIAGNOSIS — R600 Localized edema: Secondary | ICD-10-CM | POA: Diagnosis not present

## 2020-04-20 DIAGNOSIS — M25532 Pain in left wrist: Secondary | ICD-10-CM | POA: Diagnosis not present

## 2020-04-20 DIAGNOSIS — R634 Abnormal weight loss: Secondary | ICD-10-CM | POA: Diagnosis not present

## 2020-04-20 MED ORDER — LORAZEPAM 0.5 MG PO TABS: ORAL_TABLET | ORAL | 1 refills | Status: DC

## 2020-04-20 MED ORDER — TRAZODONE HCL 50 MG PO TABS: 50.0000 mg | ORAL_TABLET | Freq: Every evening | ORAL | 6 refills | Status: DC | PRN

## 2020-04-20 NOTE — Telephone Encounter (Signed)
Noted  

## 2020-04-20 NOTE — Telephone Encounter (Signed)
E 

## 2020-04-20 NOTE — Progress Notes (Signed)
Office Visit Note   Patient: Vanessa Lara           Date of Birth: [birth year]           MRN: [id number] Visit Date: 04/20/2020 Requested by: Eunice Blase, MD [address] [name],  [place] [address] PCP: Eunice Blase, MD  Subjective: Chief Complaint  Patient presents with  . Left Foot - Pain    Woke up with pain in the foot this morning. NKI. Hurt to bear weight on it. Wrapped foot in ACE bandage and took 1/2 a Tylenol and some Gabapentin - has helped.  . follow up leg edema  . follow up MRI right wrist  . ear check/vertigo a couple wks ago    HPI: She is here for follow-up unintended weight loss.  She has lost 2 more pounds since last visit.  Appetite is slightly diminished, she thinks it might be from her medications.  She is still eating but not as much as she used to.  No melena or hematochezia.  Continues to have bilateral wrist pain.  Scheduled to see Dr. Ninfa Linden next week.  Had vertigo a couple weeks ago and would like me to check for increase.  She is feeling better now.  Also having trouble cutting her pills in half due to the RA of her hands.  She would also like to have a rheumatoid factor recheck.                ROS:   All other systems were reviewed and are negative.  Objective: Vital Signs: Wt 85 lb 3.2 oz (38.6 kg)   BMI 16.64 kg/m   Physical Exam:  General:  Alert and oriented, in no acute distress. Pulm:  Breathing unlabored. Psy:  Normal mood, congruent affect.  HEENT: Ear canals and tympanic membranes look normal.   Imaging: No results found.  Assessment & Plan: 1.  Unintended weight loss -Return in the month.  If still losing weight, then possibly GI consult.  2.  Recent episode of vertigo, resolved.  3.  Anxiety and insomnia -Refilled trazodone.  Have asked her pharmacy to cut her Ativan pills for her.  4.  Bilateral wrist pain -Scheduled to see Dr. Ninfa Linden next week.     Procedures: No procedures performed  No notes on  file     PMFS History: Patient Active Problem List   Diagnosis Date Noted  . Iron deficiency anemia 04/09/2020  . Primary osteoarthritis involving multiple joints 12/31/2018  . Rheumatoid arthritis involving multiple sites with positive rheumatoid factor ([place]) 12/31/2018  . Irritable bowel syndrome with both constipation and diarrhea 10/28/2018  . Constipation 03/14/2016  . Mitral valve prolapse 11/23/2015  . High cholesterol 11/23/2015   Past Medical History:  Diagnosis Date  . Anemia   . Arthritis   . Cancer (HCC)    Skin  . CHF (congestive heart failure) ([place])   . Collagen vascular disease ([place])   . Depression   . Endometriosis   . High cholesterol   . IBS (irritable bowel syndrome)   . Mitral valve prolapse   . Uterus, adenomyosis     Family History  Problem Relation Age of Onset  . Colon cancer Other   . Bone cancer Mother   . Brain cancer Father   . Brain cancer Sister   . Renal Disease Sister     Past Surgical History:  Procedure Laterality Date  . CARPAL TUNNEL RELEASE Right   . COLONOSCOPY    .  COLONOSCOPY N/A 12/13/2015   Procedure: COLONOSCOPY;  Surgeon: Rogene Houston, MD;  [facility];  Service: Endoscopy;  Laterality: N/A;  9:55  . Fatty tumor     2011 (left arm)  . TONSILLECTOMY    . TOTAL ABDOMINAL HYSTERECTOMY     2003   Social History   Occupational History  . Occupation: retired  Tobacco Use  . Smoking status: Former Smoker    Packs/day: 0.50    Years: 33.00    Pack years: 16.50    Types: Cigarettes  . Smokeless tobacco: Never Used  . Tobacco comment: quit 9 yrs ago  Vaping Use  . Vaping Use: Never used  Substance and Sexual Activity  . Alcohol use: No    Alcohol/week: 0.0 standard drinks  . Drug use: No  . Sexual activity: Not Currently

## 2020-04-20 NOTE — Telephone Encounter (Signed)
Pt called stating she believes she's cracked a bone on the underside of her foot and would like to have an X-Ray done when she comes in for her appt today @ 3.

## 2020-04-21 ENCOUNTER — Telehealth: Payer: Self-pay | Admitting: Family Medicine

## 2020-04-21 NOTE — Telephone Encounter (Signed)
RF has risen from 24 to 58.

## 2020-04-21 NOTE — Telephone Encounter (Signed)
Ok to add

## 2020-04-21 NOTE — Telephone Encounter (Signed)
I called and advised the patient of the results.   She asks if we could add a blood sugar to her lab test from yesterday. Her blood was taken about 2 hours after eating. She is just concerned because her HgbA1C was higher when checked last month. They should have enough blood being held at the lab. Just need the authorization to add the test.

## 2020-04-21 NOTE — Telephone Encounter (Signed)
Blood glucose test added.

## 2020-04-22 ENCOUNTER — Inpatient Hospital Stay (HOSPITAL_COMMUNITY): Payer: BC Managed Care – PPO

## 2020-04-22 VITALS — BP 111/73 | HR 82 | Temp 97.3°F | Resp 17

## 2020-04-22 DIAGNOSIS — D649 Anemia, unspecified: Secondary | ICD-10-CM | POA: Diagnosis not present

## 2020-04-22 DIAGNOSIS — D509 Iron deficiency anemia, unspecified: Secondary | ICD-10-CM

## 2020-04-22 DIAGNOSIS — Z79899 Other long term (current) drug therapy: Secondary | ICD-10-CM | POA: Diagnosis not present

## 2020-04-22 DIAGNOSIS — M069 Rheumatoid arthritis, unspecified: Secondary | ICD-10-CM | POA: Diagnosis not present

## 2020-04-22 DIAGNOSIS — M810 Age-related osteoporosis without current pathological fracture: Secondary | ICD-10-CM | POA: Diagnosis not present

## 2020-04-22 MED ORDER — ACETAMINOPHEN 325 MG PO TABS: 650.0000 mg | ORAL_TABLET | Freq: Once | ORAL | Status: DC

## 2020-04-22 MED ORDER — SODIUM CHLORIDE 0.9 % IV SOLN: Freq: Once | INTRAVENOUS | Status: AC

## 2020-04-22 MED ORDER — SODIUM CHLORIDE 0.9 % IV SOLN
510.0000 mg | Freq: Once | INTRAVENOUS | Status: AC
  Administered 2020-04-22: 510 mg via INTRAVENOUS
  Filled 2020-04-22: qty 510

## 2020-04-22 MED ORDER — SODIUM CHLORIDE 0.9 % IV SOLN: INTRAVENOUS | Status: DC

## 2020-04-22 MED ORDER — LORATADINE 10 MG PO TABS: 10.0000 mg | ORAL_TABLET | Freq: Once | ORAL | Status: DC

## 2020-04-22 NOTE — Patient Instructions (Signed)
[facility] at [facility]  Discharge Instructions:   _______________________________________________________________  Thank you for choosing [facility] at [facility] to provide your oncology and hematology care.  To afford each patient quality time with our providers, please arrive at least 15 minutes before your scheduled appointment.  You need to re-schedule your appointment if you arrive 10 or more minutes late.  We strive to give you quality time with our providers, and arriving late affects you and other patients whose appointments are after yours.  Also, if you no show three or more times for appointments you may be dismissed from the clinic.  Again, thank you for choosing [facility] at [facility]. Our hope is that these requests will allow you access to exceptional care and in a timely manner. _______________________________________________________________  If you have questions after your visit, please contact our office at [phone] between the hours of 8:30 a.m. and 5:00 p.m. Voicemails left after 4:30 p.m. will not be returned until the following business day. _______________________________________________________________  For prescription refill requests, have your pharmacy contact our office. _______________________________________________________________  Recommendations made by the consultant and any test results will be sent to your referring physician. _______________________________________________________________ 

## 2020-04-22 NOTE — Progress Notes (Signed)
Patient presents today for second Feraheme infusion. Vital signs are stable. Patient has complaints of fatigue. Verbal order received to infuse 551mls of Normal Saline over an hour today with Feraheme infusion.  Patient given new Hemoccult cards to take home. Patient instructed on collection. Understanding verbalized.   Feraheme given today per MD orders. Tolerated infusion without adverse affects. Vital signs stable. No complaints at this time. Discharged from clinic ambulatory. F/U with [facility] as scheduled.

## 2020-04-23 LAB — GLUCOSE, FASTING: Glucose, Bld: 87 mg/dL (ref 65–99)

## 2020-04-23 LAB — RHEUMATOID FACTOR: Rheumatoid fact SerPl-aCnc: 32 IU/mL — ABNORMAL HIGH (ref <14)

## 2020-04-27 ENCOUNTER — Other Ambulatory Visit: Payer: Self-pay | Admitting: Family Medicine

## 2020-04-27 ENCOUNTER — Other Ambulatory Visit: Payer: BC Managed Care – PPO

## 2020-04-27 DIAGNOSIS — R634 Abnormal weight loss: Secondary | ICD-10-CM

## 2020-04-28 ENCOUNTER — Telehealth: Payer: Self-pay | Admitting: Family Medicine

## 2020-04-28 ENCOUNTER — Ambulatory Visit (INDEPENDENT_AMBULATORY_CARE_PROVIDER_SITE_OTHER): Payer: BC Managed Care – PPO | Admitting: Orthopaedic Surgery

## 2020-04-28 DIAGNOSIS — G8929 Other chronic pain: Secondary | ICD-10-CM | POA: Diagnosis not present

## 2020-04-28 DIAGNOSIS — M25511 Pain in right shoulder: Secondary | ICD-10-CM

## 2020-04-28 DIAGNOSIS — M25532 Pain in left wrist: Secondary | ICD-10-CM

## 2020-04-28 MED ORDER — LIDOCAINE HCL 1 % IJ SOLN
3.0000 mL | INTRAMUSCULAR | Status: AC | PRN
  Administered 2020-04-28: 3 mL

## 2020-04-28 MED ORDER — METHYLPREDNISOLONE ACETATE 40 MG/ML IJ SUSP
40.0000 mg | INTRAMUSCULAR | Status: AC | PRN
  Administered 2020-04-28: 40 mg via INTRA_ARTICULAR

## 2020-04-28 NOTE — Progress Notes (Signed)
Office Visit Note   Patient: Vanessa Lara           Date of Birth: [birth year]           MRN: [id number] Visit Date: 04/28/2020              Requested by: Eunice Blase, MD [address] [place],  [place] [address] PCP: Eunice Blase, MD   Assessment & Plan: Visit Diagnoses:  1. Chronic right shoulder pain   2. Pain in left wrist     Plan: I did speak with her about trying a steroid injection in her right shoulder as opposed to surgery since there is no significant weakness and she agreed to this and tolerated it well.  We will order MRI of her left wrist to assess the left wrist masses as well as a look at the ulnar side of her left wrist.  All question concerns were answered addressed.  We will see her back in follow-up after she has the MRI of her left wrist.  We can then also get her set up to see Dr. Sharol Given for her hammertoe in the future.  Follow-Up Instructions: Return in about 3 weeks (around 05/19/2020).   Orders:  Orders Placed This Encounter  Procedures  . Large Joint Inj   No orders of the defined types were placed in this encounter.     Procedures: Large Joint Inj: R subacromial bursa on 04/28/2020 10:44 AM Indications: pain and diagnostic evaluation Details: 22 G 1.5 in needle  Arthrogram: No  Medications: 3 mL lidocaine 1 %; 40 mg methylPREDNISolone acetate 40 MG/ML Outcome: tolerated well, no immediate complications Procedure, treatment alternatives, risks and benefits explained, specific risks discussed. Consent was given by the patient. Immediately prior to procedure a time out was called to verify the correct patient, procedure, equipment, support staff and site/side marked as required. Patient was prepped and draped in the usual sterile fashion.       Clinical Data: No additional findings.   Subjective: Chief Complaint  Patient presents with  . Right Shoulder - Pain  The patient comes in today with 3 different issues.  This is mainly for her  right shoulder but also her left wrist and a hammertoe on her left foot.  She understands that I will not address the hammertoe today.  She has had an MRI of her right shoulder.  This did show a partial rotator cuff tear on the bursal surface.  She has had some injections around her shoulder but not in the subacromial space from as far as I can tell.  She is also been concerned about left wrist pain and a bulge on the volar aspect of the left wrist is where she points as well as the ulnar side of her left wrist.  This is been going on for a while_pain sensation she gets with certain activities in the wrist.  She also hurts with overhead activities with the right shoulder but the pain comes and goes and she denies any significant weakness.  HPI  Review of Systems There is currently no headache, chest pain, shortness of breath, fever, chills, nausea, vomiting  Objective: Vital Signs: There were no vitals taken for this visit.  Physical Exam She is alert and orient x3 and in no acute distress Ortho Exam Examination of her right shoulder shows no weakness in the shoulder but there is definitely some signs of impingement with pain in the subacromial outlet.  There is positive Neer and  Hawkins signs.  Rotator cuff itself feels strong.  Examination of her left wrist does show a palpable mass on the volar aspect of the wrist is more ulnar.  I am unsure if this is a tenosynovitis versus a cyst.  There is also ulnar-sided wrist pain with full range of motion of the wrist. Specialty Comments:  No specialty comments available.  Imaging: No results found. The MRI is independent reviewed and does show some bursal surface tearing of the rotator cuff but no full-thickness tear.  There is some fluid in the subacromial outlet as well.  The shoulder is well located.  PMFS History: Patient Active Problem List   Diagnosis Date Noted  . Iron deficiency anemia 04/09/2020  . Primary osteoarthritis involving  multiple joints 12/31/2018  . Rheumatoid arthritis involving multiple sites with positive rheumatoid factor ([place]) 12/31/2018  . Irritable bowel syndrome with both constipation and diarrhea 10/28/2018  . Constipation 03/14/2016  . Mitral valve prolapse 11/23/2015  . High cholesterol 11/23/2015   Past Medical History:  Diagnosis Date  . Anemia   . Arthritis   . Cancer (HCC)    Skin  . CHF (congestive heart failure) ([place])   . Collagen vascular disease ([place])   . Depression   . Endometriosis   . High cholesterol   . IBS (irritable bowel syndrome)   . Mitral valve prolapse   . Uterus, adenomyosis     Family History  Problem Relation Age of Onset  . Colon cancer Other   . Bone cancer Mother   . Brain cancer Father   . Brain cancer Sister   . Renal Disease Sister     Past Surgical History:  Procedure Laterality Date  . CARPAL TUNNEL RELEASE Right   . COLONOSCOPY    . COLONOSCOPY N/A 12/13/2015   Procedure: COLONOSCOPY;  Surgeon: Rogene Houston, MD;  [facility];  Service: Endoscopy;  Laterality: N/A;  9:55  . Fatty tumor     2011 (left arm)  . TONSILLECTOMY    . TOTAL ABDOMINAL HYSTERECTOMY     2003   Social History   Occupational History  . Occupation: retired  Tobacco Use  . Smoking status: Former Smoker    Packs/day: 0.50    Years: 33.00    Pack years: 16.50    Types: Cigarettes  . Smokeless tobacco: Never Used  . Tobacco comment: quit 9 yrs ago  Vaping Use  . Vaping Use: Never used  Substance and Sexual Activity  . Alcohol use: No    Alcohol/week: 0.0 standard drinks  . Drug use: No  . Sexual activity: Not Currently

## 2020-04-28 NOTE — Telephone Encounter (Signed)
I called and left a voice mail: after checking with our referral coordinator, most nutritionists are scheduling late August/early September. I advised her to ask to be put on the waiting list at [facility], if they have one, to hopefully get a sooner appointment.

## 2020-04-28 NOTE — Telephone Encounter (Signed)
Patient would like a referral to another nutritionist. [facility] has her scheduled for 8/18 but she would like a sooner appointment. Her number is [phone]

## 2020-04-29 ENCOUNTER — Other Ambulatory Visit: Payer: Self-pay

## 2020-04-29 DIAGNOSIS — M25532 Pain in left wrist: Secondary | ICD-10-CM

## 2020-05-04 ENCOUNTER — Telehealth: Payer: Self-pay | Admitting: Orthopaedic Surgery

## 2020-05-04 NOTE — Telephone Encounter (Signed)
Called patient left voicemail message to return call to schedule MRI review with Dr Ninfa Linden   MRI scheduled for 05/20/2020

## 2020-05-15 ENCOUNTER — Other Ambulatory Visit: Payer: Self-pay | Admitting: Family Medicine

## 2020-05-18 ENCOUNTER — Other Ambulatory Visit (HOSPITAL_COMMUNITY): Payer: Self-pay

## 2020-05-18 ENCOUNTER — Telehealth: Payer: Self-pay | Admitting: Family Medicine

## 2020-05-18 DIAGNOSIS — D509 Iron deficiency anemia, unspecified: Secondary | ICD-10-CM

## 2020-05-18 NOTE — Telephone Encounter (Signed)
Left message on voice mail to call back - need some clarification - cannot see where Dr. Junius Roads has ordered any blood work recently for her.

## 2020-05-18 NOTE — Telephone Encounter (Signed)
Patient called.   She is requesting the order for her lab work be sent to [facility] instead of the facility it was originally sent to. She is also requesting we add a Calcium and cortisone level check to the order.   Call back: [phone]

## 2020-05-19 ENCOUNTER — Other Ambulatory Visit (HOSPITAL_COMMUNITY): Payer: Self-pay | Admitting: *Deleted

## 2020-05-19 ENCOUNTER — Inpatient Hospital Stay (HOSPITAL_COMMUNITY): Payer: BC Managed Care – PPO | Attending: Hematology

## 2020-05-19 ENCOUNTER — Ambulatory Visit: Payer: BC Managed Care – PPO | Admitting: Orthopaedic Surgery

## 2020-05-19 DIAGNOSIS — D509 Iron deficiency anemia, unspecified: Secondary | ICD-10-CM

## 2020-05-19 NOTE — Telephone Encounter (Signed)
The patient called back - the oncology office ordered the [place]. The patient was advised to call them for any changes/additions.

## 2020-05-20 ENCOUNTER — Other Ambulatory Visit: Payer: Self-pay

## 2020-05-20 ENCOUNTER — Other Ambulatory Visit (HOSPITAL_COMMUNITY): Payer: Self-pay | Admitting: *Deleted

## 2020-05-20 ENCOUNTER — Ambulatory Visit (HOSPITAL_COMMUNITY)
Admission: RE | Admit: 2020-05-20 | Discharge: 2020-05-20 | Disposition: A | Discharge status: Home / Self Care | Payer: Medicaid Other | Source: Ambulatory Visit | Attending: Orthopaedic Surgery | Admitting: Orthopaedic Surgery

## 2020-05-20 DIAGNOSIS — D509 Iron deficiency anemia, unspecified: Secondary | ICD-10-CM

## 2020-05-20 DIAGNOSIS — M25532 Pain in left wrist: Secondary | ICD-10-CM | POA: Insufficient documentation

## 2020-05-20 DIAGNOSIS — M19032 Primary osteoarthritis, left wrist: Secondary | ICD-10-CM | POA: Diagnosis not present

## 2020-05-20 DIAGNOSIS — S56512A Strain of other extensor muscle, fascia and tendon at forearm level, left arm, initial encounter: Secondary | ICD-10-CM | POA: Diagnosis not present

## 2020-05-21 ENCOUNTER — Ambulatory Visit (HOSPITAL_COMMUNITY): Payer: BC Managed Care – PPO

## 2020-05-21 ENCOUNTER — Ambulatory Visit: Payer: BC Managed Care – PPO | Admitting: Family Medicine

## 2020-05-21 NOTE — Progress Notes (Signed)
Nutrition Assessment:  Referral for needing help gaining weight.   64 year old female with anemia. Past medical history of depression, endometriosis, high cholesterol, IBS.    Spoke with patient via phone.  Patient reports that struggles with eating due to IBS (42 years).  Reports has been to multiple specialists.  Reports that she can't eat anything high fat or junk foods due to IBS.  Reports that she does not think it is her appetite.  Planning to see doctors this week to learn more about her RA and how that might be effecting weight loss.      Medications: bentyl, ativan  Labs: reviewed  Anthropometrics:   Height: 60 inches Weight: 85 lb on 7/20 UBW: 97 lb per patient  BMI: 16  12% weight loss, unsure time frame   Estimated Energy Needs  Kcals: 1200-1300 Protein: 60-65 g  Fluid: > 1.2 L  NUTRITION DIAGNOSIS: Unintentional weight loss related to altered GI function as evidenced by 12% weight loss, IBS   INTERVENTION:  Patient wanting information regarding list of foods to help control cholesterol.  Handout will be provided to patient (picking up on 8/24 at MD appt). Discussed ways to add calories and protein to diet. IBS Nutrition Therapy from [facility] handout will be provided to patient.  Discussed number of calories patient should be eating daily to help increase weight. Discussed calorieking app to help keep up with calories.  Discussed dairy free, lactose free, gluten free oral nutrition supplements to try. Samples will be left for patient to pick up on 8/24 Contact information provided and patient prefers to reach out to RD if needed in the future   NEXT VISIT: patient to contact RD  Joydan Gretzinger B. Zenia Resides, [place], [place] Registered Dietitian [phone] (mobile)

## 2020-05-24 ENCOUNTER — Encounter: Payer: Self-pay | Admitting: Orthopaedic Surgery

## 2020-05-24 ENCOUNTER — Ambulatory Visit (INDEPENDENT_AMBULATORY_CARE_PROVIDER_SITE_OTHER): Payer: BC Managed Care – PPO | Admitting: Orthopaedic Surgery

## 2020-05-24 DIAGNOSIS — M25532 Pain in left wrist: Secondary | ICD-10-CM | POA: Diagnosis not present

## 2020-05-24 NOTE — Progress Notes (Signed)
The patient comes in today to go over MRI of her left wrist.  She does have chronic left and right wrist pain.  She is also had weight loss.  She reports weakness in the left hand and wrist.  She denies any numbness and tingling.  She has a history of carpal tunnel syndrome on the right side that did require surgery.  On examination both her ulna styloid process is are prominent on both wrists.  There are easily reducible.  She has some fullness on the volar aspect of her wrist to the ulnar side of fluid but it is not consistent with a cyst.  The MRI does show chronic rupturing of one of the tendons of the FDS tendons and there is chronic fluid in this area.  She is able to make a fist easily and flex and extend all her fingers.  There is also chronic degenerative changes at the DRUJ with chronic subluxation of the DRUJ with ulna more prominent dorsally.  This is treated with her other wrist as well.  She also has arthritis in her thumb CMC joint.  There is cystic changes in the lunate and some arthritis in the MCP joint of her thumb.  At this point she seems to be stable so I would not recommend any type of intervention for her wrist.  She does see Dr. Junius Roads in the near future and he can certainly look at the volar aspect under ultrasound to see if there is any fluid to get out the tendon sheath but this is usually an area that is in proximity to the ulnar artery.  If this becomes problematic for her we can certainly send her to the hand center of [facility] to Dr. Burney Gauze recreation of her evaluation.  All questions and concerns were addressed and answered.  Follow-up can be as needed.

## 2020-05-25 ENCOUNTER — Inpatient Hospital Stay (HOSPITAL_COMMUNITY): Payer: BC Managed Care – PPO | Admitting: Hematology

## 2020-05-26 ENCOUNTER — Ambulatory Visit (INDEPENDENT_AMBULATORY_CARE_PROVIDER_SITE_OTHER): Payer: BC Managed Care – PPO | Admitting: Family Medicine

## 2020-05-26 ENCOUNTER — Encounter: Payer: Self-pay | Admitting: Family Medicine

## 2020-05-26 ENCOUNTER — Other Ambulatory Visit: Payer: Self-pay

## 2020-05-26 VITALS — BP 96/61 | HR 92 | Wt 80.6 lb

## 2020-05-26 DIAGNOSIS — R1084 Generalized abdominal pain: Secondary | ICD-10-CM

## 2020-05-26 DIAGNOSIS — R634 Abnormal weight loss: Secondary | ICD-10-CM

## 2020-05-26 NOTE — Progress Notes (Signed)
Office Visit Note   Patient: Vanessa Lara           Date of Birth: 16-Jul-1956           MRN: [id number] Visit Date: 05/26/2020 Requested by: Eunice Blase, MD [address] [place],  [place] [address] PCP: Eunice Blase, MD  Subjective: Chief Complaint  Patient presents with  . pcp recheck    HPI: She is here for follow-up unintentional weight loss.  She keeps losing weight.  Poor appetite.  Some abdominal discomfort occasionally.  No headaches, fevers, night sweats.  She had colonoscopy 5 to 7 years ago which was unrevealing.  She had a mammogram 2 years ago.  She thought she had a chest x-ray earlier this year but I do not see one in her record.  Denies any cough or shortness of breath.                ROS:   All other systems were reviewed and are negative.  Objective: Vital Signs: BP 96/61   Pulse 92   Wt 80 lb 9.6 oz (36.6 kg)   BMI 15.74 kg/m   Physical Exam:  General:  Alert and oriented, in no acute distress. Pulm:  Breathing unlabored. Psy:  Normal mood, congruent affect.    Imaging: No results found.  Assessment & Plan: 1.  Persistent weight loss, etiology uncertain.  Worrisome for neoplasm. -GI referral for consideration of endoscopy. -New chest x-ray ordered.  CT scan if any abnormalities.     Procedures: No procedures performed  No notes on file     PMFS History: Patient Active Problem List   Diagnosis Date Noted  . Iron deficiency anemia 04/09/2020  . Primary osteoarthritis involving multiple joints 12/31/2018  . Rheumatoid arthritis involving multiple sites with positive rheumatoid factor ([place]) 12/31/2018  . Irritable bowel syndrome with both constipation and diarrhea 10/28/2018  . Constipation 03/14/2016  . Mitral valve prolapse 11/23/2015  . High cholesterol 11/23/2015   Past Medical History:  Diagnosis Date  . Anemia   . Arthritis   . Cancer (HCC)    Skin  . CHF (congestive heart failure) ([place])   . Collagen vascular disease  ([place])   . Depression   . Endometriosis   . High cholesterol   . IBS (irritable bowel syndrome)   . Mitral valve prolapse   . Uterus, adenomyosis     Family History  Problem Relation Age of Onset  . Colon cancer Other   . Bone cancer Mother   . Brain cancer Father   . Brain cancer Sister   . Renal Disease Sister     Past Surgical History:  Procedure Laterality Date  . CARPAL TUNNEL RELEASE Right   . COLONOSCOPY    . COLONOSCOPY N/A 12/13/2015   Procedure: COLONOSCOPY;  Surgeon: Rogene Houston, MD;  [facility];  Service: Endoscopy;  Laterality: N/A;  9:55  . Fatty tumor     2011 (left arm)  . TONSILLECTOMY    . TOTAL ABDOMINAL HYSTERECTOMY     2003   Social History   Occupational History  . Occupation: retired  Tobacco Use  . Smoking status: Former Smoker    Packs/day: 0.50    Years: 33.00    Pack years: 16.50    Types: Cigarettes  . Smokeless tobacco: Never Used  . Tobacco comment: quit 9 yrs ago  Vaping Use  . Vaping Use: Never used  Substance and Sexual Activity  . Alcohol  use: No    Alcohol/week: 0.0 standard drinks  . Drug use: No  . Sexual activity: Not Currently

## 2020-05-31 ENCOUNTER — Encounter (INDEPENDENT_AMBULATORY_CARE_PROVIDER_SITE_OTHER): Payer: Self-pay | Admitting: Gastroenterology

## 2020-05-31 ENCOUNTER — Telehealth: Payer: Self-pay | Admitting: Family Medicine

## 2020-05-31 NOTE — Telephone Encounter (Signed)
Patient called advised her GI doctor was Hildred Laser MD. Patient advised she have not seen Dr. Laural Golden in approximately 7 years. Patient said she would like to get the endoscopy done first around mid October But the consult can be scheduled sooner. The number to contact patient is [phone]

## 2020-05-31 NOTE — Telephone Encounter (Signed)
It looks like we just need to refer her to this GI doctor - a nonspecific GI referral already placed.

## 2020-05-31 NOTE — Telephone Encounter (Signed)
Faxed to [phone] (I noted in referral)  [address] [place],  [place]  [address] Main: [phone]

## 2020-06-01 ENCOUNTER — Inpatient Hospital Stay (HOSPITAL_BASED_OUTPATIENT_CLINIC_OR_DEPARTMENT_OTHER): Payer: BC Managed Care – PPO | Admitting: Hematology

## 2020-06-01 ENCOUNTER — Other Ambulatory Visit: Payer: Self-pay

## 2020-06-01 DIAGNOSIS — D509 Iron deficiency anemia, unspecified: Secondary | ICD-10-CM

## 2020-06-01 NOTE — Progress Notes (Signed)
Virtual Visit via Telephone Note  I connected with Drue Dun on 06/01/20 at  3:00 PM EDT by telephone and verified that I am speaking with the correct person using two identifiers.   I discussed the limitations, risks, security and privacy concerns of performing an evaluation and management service by telephone and the availability of in person appointments. I also discussed with the patient that there may be a patient responsible charge related to this service. The patient expressed understanding and agreed to proceed.   History of Present Illness: She was seen in our clinic for evaluation of anemia.  Labs on 03/19/2020 showed ferritin of 6 and hemoglobin 11.4.  Last colonoscopy was in 2017, nonbleeding external hemorrhoids otherwise normal exam.  CTAP on 06/13/2018 shows normal-sized spleen.   Observations/Objective: She received Feraheme on 04/15/2020 and 04/22/2020.  She reported improvement in energy which lasted few weeks.  She is now complaining of feeling tiredness.  She also reported about 4 pound weight loss since last visit on 04/09/2020.  She met with our dietitian.  Denies any bleeding per rectum or melena.  Assessment and Plan:  1.  Normocytic anemia: -Status post last Feraheme infusion on 04/22/2020. -We reviewed her prior anemia work-up which showed normal Y65, folic acid and methylmalonic acid levels.  Copper levels were normal.  SPEP was negative. -As she is complaining of fatigue again, I have recommended 1 dose of Feraheme.  I plan to see her back in 6 weeks for follow-up.  2.  Osteoporosis: -DEXA scan on 04/13/2020 shows T score -4.2. -I discussed about her new diagnosis.  I have also recommended Prolia every 6 months. -She will see her dentist and get clearance.  3.  Weight loss: -She met with our dietitian.  She reported another 4 pound weight loss since last visit. -I will evaluate her in 6 weeks.  If she continues to lose weight, will consider doing CT  CAP.   Follow Up Instructions:    I discussed the assessment and treatment plan with the patient. The patient was provided an opportunity to ask questions and all were answered. The patient agreed with the plan and demonstrated an understanding of the instructions.   The patient was advised to call back or seek an in-person evaluation if the symptoms worsen or if the condition fails to improve as anticipated.  I provided 21 minutes of non-face-to-face time during this encounter.   Derek Jack, MD

## 2020-06-08 ENCOUNTER — Telehealth: Payer: Self-pay | Admitting: Family Medicine

## 2020-06-08 ENCOUNTER — Telehealth: Payer: Self-pay | Admitting: Specialist

## 2020-06-08 DIAGNOSIS — M9902 Segmental and somatic dysfunction of thoracic region: Secondary | ICD-10-CM | POA: Diagnosis not present

## 2020-06-08 DIAGNOSIS — M9903 Segmental and somatic dysfunction of lumbar region: Secondary | ICD-10-CM | POA: Diagnosis not present

## 2020-06-08 DIAGNOSIS — M546 Pain in thoracic spine: Secondary | ICD-10-CM | POA: Diagnosis not present

## 2020-06-08 DIAGNOSIS — M545 Low back pain: Secondary | ICD-10-CM | POA: Diagnosis not present

## 2020-06-08 NOTE — Telephone Encounter (Signed)
Patient called.   She is requesting a call back to discuss an alternative medication to her current pain and calming medication   Call back: [phone]

## 2020-06-08 NOTE — Telephone Encounter (Signed)
Pt would like the number for Dr. Laural Golden; since she has lost another pound and half in the last month and his office has not reached out to her.   [phone]

## 2020-06-09 DIAGNOSIS — M9903 Segmental and somatic dysfunction of lumbar region: Secondary | ICD-10-CM | POA: Diagnosis not present

## 2020-06-09 DIAGNOSIS — M9902 Segmental and somatic dysfunction of thoracic region: Secondary | ICD-10-CM | POA: Diagnosis not present

## 2020-06-09 DIAGNOSIS — M546 Pain in thoracic spine: Secondary | ICD-10-CM | POA: Diagnosis not present

## 2020-06-09 DIAGNOSIS — M545 Low back pain: Secondary | ICD-10-CM | POA: Diagnosis not present

## 2020-06-09 MED ORDER — LORAZEPAM 0.5 MG PO TABS
ORAL_TABLET | ORAL | 1 refills | Status: DC
Start: 2020-06-09 — End: 2021-01-17

## 2020-06-09 MED ORDER — CHLORZOXAZONE 500 MG PO TABS: 500.0000 mg | ORAL_TABLET | Freq: Three times a day (TID) | ORAL | 1 refills | Status: DC | PRN

## 2020-06-09 NOTE — Telephone Encounter (Signed)
The upcoming GI appointment will be the right one for her.  That's what she needs to do.

## 2020-06-09 NOTE — Telephone Encounter (Signed)
As of today, the patient has an appointment scheduled for 06/21/20 with another GI doctor in the same [facility] practice as Dr. Laural Golden.

## 2020-06-09 NOTE — Telephone Encounter (Signed)
I called patient, she states that the Baclofen 10mg  does make her sleepy but it doesn't help her pain, wants to know if the strength can be increased and she states that the Lorazepam helps her more than the Diazepam and she wants to know if she can get a refill on the Lorazepam.

## 2020-06-09 NOTE — Telephone Encounter (Signed)
I called and advised patient that these meds were sent in. She states that she has ate like a horse and had a bowel movement the other that would have covered the state. She states that she had a really good lunch today and now she has diarrhea, and has messed in her clothes, and she states that she has probably lost another 5 lbs, and she is really upset and not sure what else to do, she says that she is starting to see her ribs.  Please advise.

## 2020-06-09 NOTE — Addendum Note (Signed)
Addended by: Hortencia Pilar on: 06/09/2020 05:01 PM   Modules accepted: Orders

## 2020-06-09 NOTE — Telephone Encounter (Signed)
Rx sent for lorazepam and for parafon forte (to take place of baclofen).

## 2020-06-10 ENCOUNTER — Ambulatory Visit (HOSPITAL_COMMUNITY): Payer: BC Managed Care – PPO

## 2020-06-10 NOTE — Telephone Encounter (Signed)
I called and lmom advising her to keep appt with her GI dr per Dr. Junius Roads.

## 2020-06-11 ENCOUNTER — Telehealth: Payer: Self-pay | Admitting: Orthopaedic Surgery

## 2020-06-11 ENCOUNTER — Telehealth: Payer: Self-pay | Admitting: Family Medicine

## 2020-06-11 MED ORDER — DIPHENOXYLATE-ATROPINE 2.5-0.025 MG PO TABS: 1.0000 | ORAL_TABLET | Freq: Four times a day (QID) | ORAL | 0 refills | Status: DC | PRN

## 2020-06-11 NOTE — Telephone Encounter (Signed)
Patient called advised about having connected tissue CA. Patient said according to what she looked up on the web my S and S are signs of connected tissue CA.   Patient said she lost a lb and a half within the last 7 days. Patient said she is down to 79 lbs. Patient said please call her and let her know Dr. Junius Roads thoughts. Patient said she need medicine for Diarrhea called into her pharmacy. Patient said over the counter medicine does not work.  Patient said she would like a call back today because she called with this message at 8:07 am. The number to contact patient is [phone]

## 2020-06-11 NOTE — Telephone Encounter (Signed)
Lomotil Rx sent to pharmacy for diarrhea.  I think connective tissue cancer would be very unusual, but this question would be best to ask the oncologist/hematologist.

## 2020-06-11 NOTE — Telephone Encounter (Signed)
Outside of physical therapy and injections for her right shoulder, the only other thing we could recommend is shoulder arthroscopy.  If you would like to have that done, we need to schedule to see her in the office again to go over surgery and to reassess her shoulder.

## 2020-06-11 NOTE — Telephone Encounter (Signed)
Patient called advised it is her left shoulder that she is having painful issues with. Patient would like a call back to discuss prior to making an appointment to be seen in the office. The number to contact patient is [phone]

## 2020-06-11 NOTE — Telephone Encounter (Signed)
LMOM for patient of the below message from [place]  

## 2020-06-11 NOTE — Telephone Encounter (Signed)
I called and advised the patient. She has an appointment with oncology/hematology on Monday (9/13) and will ask about connective tissue cancer at that time.

## 2020-06-11 NOTE — Telephone Encounter (Signed)
Wants to know what she should do next with the shoulder pain we see her for

## 2020-06-11 NOTE — Telephone Encounter (Signed)
Patient called.  She is requesting a call back to discuss some concerns she has with her left shoulder. She wants to figure out what her next step should be.   Call back: [phone]

## 2020-06-11 NOTE — Telephone Encounter (Signed)
Please advise 

## 2020-06-14 ENCOUNTER — Encounter (HOSPITAL_COMMUNITY): Payer: Self-pay

## 2020-06-14 ENCOUNTER — Inpatient Hospital Stay (HOSPITAL_COMMUNITY): Payer: Medicaid Other | Attending: Hematology

## 2020-06-14 VITALS — BP 114/57 | HR 77 | Temp 97.3°F | Resp 16 | Wt 79.6 lb

## 2020-06-14 DIAGNOSIS — D649 Anemia, unspecified: Secondary | ICD-10-CM | POA: Diagnosis not present

## 2020-06-14 DIAGNOSIS — M810 Age-related osteoporosis without current pathological fracture: Secondary | ICD-10-CM | POA: Insufficient documentation

## 2020-06-14 DIAGNOSIS — D509 Iron deficiency anemia, unspecified: Secondary | ICD-10-CM

## 2020-06-14 DIAGNOSIS — Z79899 Other long term (current) drug therapy: Secondary | ICD-10-CM | POA: Diagnosis not present

## 2020-06-14 DIAGNOSIS — R634 Abnormal weight loss: Secondary | ICD-10-CM | POA: Insufficient documentation

## 2020-06-14 DIAGNOSIS — M069 Rheumatoid arthritis, unspecified: Secondary | ICD-10-CM | POA: Insufficient documentation

## 2020-06-14 MED ORDER — SODIUM CHLORIDE 0.9 % IV SOLN: INTRAVENOUS | Status: DC

## 2020-06-14 MED ORDER — SODIUM CHLORIDE 0.9 % IV SOLN
510.0000 mg | Freq: Once | INTRAVENOUS | Status: AC
  Administered 2020-06-14: 510 mg via INTRAVENOUS
  Filled 2020-06-14: qty 510

## 2020-06-14 NOTE — Patient Instructions (Signed)
[facility]  Discharge Instructions:   _______________________________________________________________  Thank you for choosing [facility] to provide your oncology and hematology care.  To afford each patient quality time with our providers, please arrive at least 15 minutes before your scheduled appointment.  You need to re-schedule your appointment if you arrive 10 or more minutes late.  We strive to give you quality time with our providers, and arriving late affects you and other patients whose appointments are after yours.  Also, if you no show three or more times for appointments you may be dismissed from the clinic.  Again, thank you for choosing [facility]. Our hope is that these requests will allow you access to exceptional care and in a timely manner. _______________________________________________________________  If you have questions after your visit, please contact our office at [phone] between the hours of 8:30 a.m. and 5:00 p.m. Voicemails left after 4:30 p.m. will not be returned until the following business day. _______________________________________________________________  For prescription refill requests, have your pharmacy contact our office. _______________________________________________________________  Recommendations made by the consultant and any test results will be sent to your referring physician. _______________________________________________________________ 

## 2020-06-14 NOTE — Telephone Encounter (Signed)
LMOM for patient that I was returning her call 

## 2020-06-14 NOTE — Progress Notes (Signed)
Patient requesting to speak with the oncologist for concerns of having a connective tissue disorder that she has researched on the internet.  Also, requesting a bolus of saline like previous infusions of iron due to history of feeling dizzy/lightheadedness prn.  No s/s of distress noted.   Reviewed patients complaints with ok to give normal saline bolus verbal order Dr. Delton Coombes.  Patient to be scheduled for oncology follow up with Francene Finders, NP.    Patient tolerated iron infusion with no complaints voiced.  Peripheral IV site clean and dry with good blood return noted before and after infusion.  Band aid applied.  VSS with discharge and left in satisfactory condition with no s/s of distress noted.

## 2020-06-15 ENCOUNTER — Telehealth: Payer: Self-pay | Admitting: Family Medicine

## 2020-06-15 ENCOUNTER — Other Ambulatory Visit (HOSPITAL_COMMUNITY): Payer: Self-pay | Admitting: Hematology

## 2020-06-15 DIAGNOSIS — M810 Age-related osteoporosis without current pathological fracture: Secondary | ICD-10-CM | POA: Insufficient documentation

## 2020-06-15 DIAGNOSIS — M545 Low back pain: Secondary | ICD-10-CM | POA: Diagnosis not present

## 2020-06-15 DIAGNOSIS — M546 Pain in thoracic spine: Secondary | ICD-10-CM | POA: Diagnosis not present

## 2020-06-15 DIAGNOSIS — M9902 Segmental and somatic dysfunction of thoracic region: Secondary | ICD-10-CM | POA: Diagnosis not present

## 2020-06-15 DIAGNOSIS — M9903 Segmental and somatic dysfunction of lumbar region: Secondary | ICD-10-CM | POA: Diagnosis not present

## 2020-06-15 NOTE — Telephone Encounter (Signed)
Patient called requesting a call back from dr. Junius Roads. Patient states that she would like doctor to contact her doctor cancer center. Please call patient at [phone].

## 2020-06-16 ENCOUNTER — Telehealth (HOSPITAL_COMMUNITY): Payer: Self-pay

## 2020-06-16 ENCOUNTER — Other Ambulatory Visit: Payer: Self-pay | Admitting: Family Medicine

## 2020-06-16 DIAGNOSIS — R634 Abnormal weight loss: Secondary | ICD-10-CM

## 2020-06-16 NOTE — Telephone Encounter (Signed)
Stephanie from Dr. Laurice Record office called to inform us that patient had been cleared for injections from our office.

## 2020-06-16 NOTE — Telephone Encounter (Signed)
Discussed with hematologist/oncologist and decided to order CT scan of chest, abdomen and pelvis to further evaluate.

## 2020-06-16 NOTE — Telephone Encounter (Signed)
I called the patient and advised her of the plan. She said she will travel to wherever can do the CT the soonest. I sent a message to our referral coordinator, advising her of this.

## 2020-06-17 ENCOUNTER — Telehealth: Payer: Self-pay | Admitting: Family Medicine

## 2020-06-17 MED ORDER — HYDROCODONE-ACETAMINOPHEN 5-325 MG PO TABS: 1.0000 | ORAL_TABLET | Freq: Four times a day (QID) | ORAL | 0 refills | Status: DC | PRN

## 2020-06-17 NOTE — Telephone Encounter (Signed)
When I called the patient yesterday evening, she was crying due to severe pain in her wrists/hands. The day before she had had an iron infusion. She said the facility was extremely cold and she has not been able to warm up since, despite being wrapped in an electric blanket for most of the time. Her wrists and hands have been hurting her ever since. She has meloxicam for pain, which eases it some. The patient calls today to see if there is anything else she can do for this pain.

## 2020-06-17 NOTE — Telephone Encounter (Signed)
Patient called asking if there is anything else she can do about her pains in wrist and hands. Patient states medication only makes her sleep. Patient  Is asking for Terri to give a call back. Patient phone number is [phone].

## 2020-06-17 NOTE — Telephone Encounter (Signed)
I called and advised the patient. She said she has had issues with codeine and stomach pains/constipation --- she does not want to chance the hydrocodone. I will put this on her allergy list, due to the intolerance of the medication/ingredient. The patient said she will continue her meloxicam and parafon forte, as they do ease her pain (she has refills available on both of these, when needed).

## 2020-06-17 NOTE — Telephone Encounter (Signed)
Norco Rx sent.

## 2020-06-21 ENCOUNTER — Ambulatory Visit (INDEPENDENT_AMBULATORY_CARE_PROVIDER_SITE_OTHER): Payer: BC Managed Care – PPO | Admitting: Gastroenterology

## 2020-06-21 DIAGNOSIS — M545 Low back pain: Secondary | ICD-10-CM | POA: Diagnosis not present

## 2020-06-21 DIAGNOSIS — M9903 Segmental and somatic dysfunction of lumbar region: Secondary | ICD-10-CM | POA: Diagnosis not present

## 2020-06-21 DIAGNOSIS — M9902 Segmental and somatic dysfunction of thoracic region: Secondary | ICD-10-CM | POA: Diagnosis not present

## 2020-06-21 DIAGNOSIS — M546 Pain in thoracic spine: Secondary | ICD-10-CM | POA: Diagnosis not present

## 2020-06-22 ENCOUNTER — Ambulatory Visit: Payer: BC Managed Care – PPO

## 2020-06-22 ENCOUNTER — Other Ambulatory Visit: Payer: Self-pay | Admitting: Family Medicine

## 2020-06-22 ENCOUNTER — Ambulatory Visit (INDEPENDENT_AMBULATORY_CARE_PROVIDER_SITE_OTHER): Payer: BC Managed Care – PPO | Admitting: Family Medicine

## 2020-06-22 ENCOUNTER — Encounter: Payer: Self-pay | Admitting: Family Medicine

## 2020-06-22 ENCOUNTER — Other Ambulatory Visit: Payer: Self-pay

## 2020-06-22 VITALS — Wt 79.0 lb

## 2020-06-22 DIAGNOSIS — G8929 Other chronic pain: Secondary | ICD-10-CM | POA: Diagnosis not present

## 2020-06-22 DIAGNOSIS — Z1231 Encounter for screening mammogram for malignant neoplasm of breast: Secondary | ICD-10-CM

## 2020-06-22 DIAGNOSIS — M25512 Pain in left shoulder: Secondary | ICD-10-CM

## 2020-06-22 DIAGNOSIS — M25511 Pain in right shoulder: Secondary | ICD-10-CM

## 2020-06-22 MED ORDER — CHLORZOXAZONE 250 MG PO TABS: 250.0000 mg | ORAL_TABLET | Freq: Four times a day (QID) | ORAL | 3 refills | Status: DC | PRN

## 2020-06-22 NOTE — Progress Notes (Signed)
Office Visit Note   Patient: Vanessa Lara           Date of Birth: [birth year]           MRN: [id number] Visit Date: 06/22/2020 Requested by: Eunice Blase, MD [address] [place],  [place] [address] PCP: Eunice Blase, MD  Subjective: Chief Complaint  Patient presents with  . Right Shoulder - Pain  . Left Shoulder - Pain    Bilateral shoulder pain, left >right. Did see the chiropractor yesterday - did help the muscles in the arms.    HPI: She's here with L>R shoulder pain.  Flare-up in the past week.  Severe pain, wants injections.               ROS:   All other systems were reviewed and are negative.  Objective: Vital Signs: Wt 79 lb (35.8 kg)   BMI 15.43 kg/m   Physical Exam:  General:  Alert and oriented, in no acute distress. Pulm:  Breathing unlabored. Psy:  Normal mood, congruent affect. Skin:  No rash on shoulders.  Shoulders:  Pain and decreased ROM with overhead reach.  Imaging: US Guided Needle Placement  Result Date: 06/22/2020 Ultrasound-guided bilateral glenohumeral injection: After sterile prep with Betadine, injected 8 cc 1% lidocaine without epinephrine and 40 mg methylprednisolone using a 22-gauge spinal needle, passing the needle from posterior approach into the glenohumeral joint.  Injectate seen filling joint capsules.  Had itching of skin of arms afterward, was given 12.5 mg benadryl.     Assessment & Plan: 1.  Recurrent bilateral shoulder pain, possibly due to RA. - Injections as above.       Procedures: No procedures performed  No notes on file     PMFS History: Patient Active Problem List   Diagnosis Date Noted  . Osteoporosis 06/15/2020  . Iron deficiency anemia 04/09/2020  . Primary osteoarthritis involving multiple joints 12/31/2018  . Rheumatoid arthritis involving multiple sites with positive rheumatoid factor ([place]) 12/31/2018  . Irritable bowel syndrome with both constipation and diarrhea 10/28/2018  . Constipation  03/14/2016  . Mitral valve prolapse 11/23/2015  . High cholesterol 11/23/2015   Past Medical History:  Diagnosis Date  . Anemia   . Arthritis   . Cancer (HCC)    Skin  . CHF (congestive heart failure) ([place])   . Collagen vascular disease ([place])   . Depression   . Endometriosis   . High cholesterol   . IBS (irritable bowel syndrome)   . Mitral valve prolapse   . Uterus, adenomyosis     Family History  Problem Relation Age of Onset  . Colon cancer Other   . Bone cancer Mother   . Brain cancer Father   . Brain cancer Sister   . Renal Disease Sister     Past Surgical History:  Procedure Laterality Date  . CARPAL TUNNEL RELEASE Right   . COLONOSCOPY    . COLONOSCOPY N/A 12/13/2015   Procedure: COLONOSCOPY;  Surgeon: Rogene Houston, MD;  [facility];  Service: Endoscopy;  Laterality: N/A;  9:55  . Fatty tumor     2011 (left arm)  . TONSILLECTOMY    . TOTAL ABDOMINAL HYSTERECTOMY     2003   Social History   Occupational History  . Occupation: retired  Tobacco Use  . Smoking status: Former Smoker    Packs/day: 0.50    Years: 33.00    Pack years: 16.50    Types: Cigarettes  .  Smokeless tobacco: Never Used  . Tobacco comment: quit 9 yrs ago  Vaping Use  . Vaping Use: Never used  Substance and Sexual Activity  . Alcohol use: No    Alcohol/week: 0.0 standard drinks  . Drug use: No  . Sexual activity: Not Currently

## 2020-06-23 ENCOUNTER — Ambulatory Visit
Admission: RE | Admit: 2020-06-23 | Discharge: 2020-06-23 | Disposition: A | Discharge status: Home / Self Care | Payer: BC Managed Care – PPO | Source: Ambulatory Visit | Attending: Family Medicine | Admitting: Family Medicine

## 2020-06-23 ENCOUNTER — Telehealth: Payer: Self-pay | Admitting: Family Medicine

## 2020-06-23 ENCOUNTER — Telehealth: Payer: Self-pay

## 2020-06-23 DIAGNOSIS — I700 Atherosclerosis of aorta: Secondary | ICD-10-CM | POA: Diagnosis not present

## 2020-06-23 DIAGNOSIS — Z1231 Encounter for screening mammogram for malignant neoplasm of breast: Secondary | ICD-10-CM

## 2020-06-23 DIAGNOSIS — S3219XA Other fracture of sacrum, initial encounter for closed fracture: Secondary | ICD-10-CM | POA: Diagnosis not present

## 2020-06-23 DIAGNOSIS — R5383 Other fatigue: Secondary | ICD-10-CM | POA: Diagnosis not present

## 2020-06-23 DIAGNOSIS — R634 Abnormal weight loss: Secondary | ICD-10-CM

## 2020-06-23 MED ORDER — IOPAMIDOL (ISOVUE-300) INJECTION 61%
80.0000 mL | Freq: Once | INTRAVENOUS | Status: AC | PRN
  Administered 2020-06-23: 80 mL via INTRAVENOUS

## 2020-06-23 NOTE — Telephone Encounter (Signed)
I'd suggest waiting a full 3 months.

## 2020-06-23 NOTE — Telephone Encounter (Signed)
The patient would like to come in today to have her lipid panel rechecked, as she has been on 5 mg of Lipitor qd (instead of 10 mg) x 6 weeks. Is it ok to do this now or should she wait a bit longer? If it is ok, are there other tests that should be done?

## 2020-06-23 NOTE — Telephone Encounter (Signed)
CT scan does not show any sign of cancer.  No reason for weight loss seen.  When reviewing her medication side effects, trazodone can possibly be associated with weight changes.  Perhaps she should stop it for a couple weeks.  Otherwise, would suggest that she work with a nutritionist for help with weight gain.

## 2020-06-23 NOTE — Telephone Encounter (Signed)
I advised the patient of her results. She had 1 televisit with the nutritionis - the patient could not tolerate the suggested drinks, due to her IBS. She said she is, however, trying to eat more high-calorie foods, as was suggested.

## 2020-06-23 NOTE — Telephone Encounter (Signed)
I called and advised the patient. 

## 2020-06-24 ENCOUNTER — Telehealth: Payer: Self-pay | Admitting: Family Medicine

## 2020-06-24 NOTE — Telephone Encounter (Signed)
Mammogram was negative/normal.   

## 2020-06-24 NOTE — Telephone Encounter (Signed)
I called and advised the patient. 

## 2020-06-25 DIAGNOSIS — M9902 Segmental and somatic dysfunction of thoracic region: Secondary | ICD-10-CM | POA: Diagnosis not present

## 2020-06-25 DIAGNOSIS — M9903 Segmental and somatic dysfunction of lumbar region: Secondary | ICD-10-CM | POA: Diagnosis not present

## 2020-06-25 DIAGNOSIS — M546 Pain in thoracic spine: Secondary | ICD-10-CM | POA: Diagnosis not present

## 2020-06-25 DIAGNOSIS — M545 Low back pain: Secondary | ICD-10-CM | POA: Diagnosis not present

## 2020-06-28 ENCOUNTER — Inpatient Hospital Stay (HOSPITAL_COMMUNITY): Payer: Medicaid Other | Admitting: Hematology

## 2020-06-28 ENCOUNTER — Other Ambulatory Visit: Payer: Self-pay

## 2020-06-28 ENCOUNTER — Inpatient Hospital Stay (HOSPITAL_COMMUNITY): Payer: Medicaid Other

## 2020-06-28 DIAGNOSIS — D649 Anemia, unspecified: Secondary | ICD-10-CM | POA: Diagnosis not present

## 2020-06-28 DIAGNOSIS — D509 Iron deficiency anemia, unspecified: Secondary | ICD-10-CM

## 2020-06-28 LAB — CBC WITH DIFFERENTIAL/PLATELET
Abs Immature Granulocytes: 0.06 10*3/uL (ref 0.00–0.07)
Basophils Absolute: 0.1 10*3/uL (ref 0.0–0.1)
Basophils Relative: 1 %
Eosinophils Absolute: 0.1 10*3/uL (ref 0.0–0.5)
Eosinophils Relative: 1 %
HCT: 43.4 % (ref 36.0–46.0)
Hemoglobin: 14 g/dL (ref 12.0–15.0)
Immature Granulocytes: 0 %
Lymphocytes Relative: 25 %
Lymphs Abs: 3.9 10*3/uL (ref 0.7–4.0)
MCH: 28.9 pg (ref 26.0–34.0)
MCHC: 32.3 g/dL (ref 30.0–36.0)
MCV: 89.7 fL (ref 80.0–100.0)
Monocytes Absolute: 0.8 10*3/uL (ref 0.1–1.0)
Monocytes Relative: 5 %
Neutro Abs: 10.3 10*3/uL — ABNORMAL HIGH (ref 1.7–7.7)
Neutrophils Relative %: 68 %
Platelets: 418 10*3/uL — ABNORMAL HIGH (ref 150–400)
RBC: 4.84 MIL/uL (ref 3.87–5.11)
RDW: 17.5 % — ABNORMAL HIGH (ref 11.5–15.5)
WBC: 15.2 10*3/uL — ABNORMAL HIGH (ref 4.0–10.5)
nRBC: 0 % (ref 0.0–0.2)

## 2020-06-28 LAB — IRON AND TIBC
Iron: 84 ug/dL (ref 28–170)
Saturation Ratios: 29 % (ref 10.4–31.8)
TIBC: 287 ug/dL (ref 250–450)
UIBC: 203 ug/dL

## 2020-06-28 LAB — FERRITIN: Ferritin: 428 ng/mL — ABNORMAL HIGH (ref 11–307)

## 2020-06-29 ENCOUNTER — Inpatient Hospital Stay (HOSPITAL_BASED_OUTPATIENT_CLINIC_OR_DEPARTMENT_OTHER): Payer: Medicaid Other | Admitting: Hematology

## 2020-06-29 ENCOUNTER — Encounter (HOSPITAL_COMMUNITY): Payer: Self-pay | Admitting: Hematology

## 2020-06-29 VITALS — BP 113/67 | HR 97 | Temp 97.2°F | Resp 16 | Wt 76.9 lb

## 2020-06-29 DIAGNOSIS — D509 Iron deficiency anemia, unspecified: Secondary | ICD-10-CM

## 2020-06-29 DIAGNOSIS — D649 Anemia, unspecified: Secondary | ICD-10-CM | POA: Diagnosis not present

## 2020-06-29 DIAGNOSIS — R634 Abnormal weight loss: Secondary | ICD-10-CM | POA: Diagnosis not present

## 2020-06-29 DIAGNOSIS — M810 Age-related osteoporosis without current pathological fracture: Secondary | ICD-10-CM

## 2020-06-29 NOTE — Patient Instructions (Signed)
[place] at [facility] Discharge Instructions  You were seen today by Dr. Delton Coombes. He went over your recent results and scans. You will be scheduled for a PET scan and additional labs before your next visit. Dr. Delton Coombes will see you back after the results are back for follow up.   Thank you for choosing [place] at [facility] to provide your oncology and hematology care.  To afford each patient quality time with our provider, please arrive at least 15 minutes before your scheduled appointment time.   If you have a lab appointment with the [facility] please come in thru the Main Entrance and check in at the main information desk  You need to re-schedule your appointment should you arrive 10 or more minutes late.  We strive to give you quality time with our providers, and arriving late affects you and other patients whose appointments are after yours.  Also, if you no show three or more times for appointments you may be dismissed from the clinic at the providers discretion.     Again, thank you for choosing [facility].  Our hope is that these requests will decrease the amount of time that you wait before being seen by our physicians.       _____________________________________________________________  Should you have questions after your visit to [facility], please contact our office at [phone] between the hours of 8:00 a.m. and 4:30 p.m.  Voicemails left after 4:00 p.m. will not be returned until the following business day.  For prescription refill requests, have your pharmacy contact our office and allow 72 hours.    Cancer Center Support Programs:   > Cancer Support Group  2nd Tuesday of the month 1pm-2pm, Journey Room

## 2020-06-29 NOTE — Progress Notes (Signed)
Vanessa Lara, Vanessa Lara [id number]   CLINIC:  Medical Oncology/Hematology  PCP:  Eunice Blase, MD [address] [place] [address]  [phone]  REASON FOR VISIT:  Follow-up for IDA and osteoporosis  PRIOR THERAPY: None  CURRENT THERAPY: Intermittent Feraheme  INTERVAL HISTORY:  [name], a 64 y.o. female, returns for routine follow-up for her IDA and osteoporosis. Vanessa Lara was last contacted via telephone on 06/01/2020.  Today she reports feeling okay. She felt better after her last Feraheme on 9/13. She has good appetite, eating 3 meals a day, but she has not had a BM since 9/18 and she is not eating fatty foods or dairy products; she reportedly has IBS. She has had RA since 2006 and was treated by a rheumatologist.  She will be seeing Dr. Jenetta Downer now since she had her CT scan.    REVIEW OF SYSTEMS:  Review of Systems  Constitutional: Positive for appetite change (75%) and fatigue (50%).  Cardiovascular: Positive for palpitations.  Gastrointestinal: Positive for abdominal pain (3/10 lower abdominal pain), constipation (1 BM per week) and nausea.  Neurological: Positive for dizziness, headaches and numbness (hands & feet).  Psychiatric/Behavioral: Positive for sleep disturbance.  All other systems reviewed and are negative.   PAST MEDICAL/SURGICAL HISTORY:  Past Medical History:  Diagnosis Date  . Anemia   . Arthritis   . Cancer (HCC)    Skin  . CHF (congestive heart failure) ([place])   . Collagen vascular disease ([place])   . Depression   . Endometriosis   . High cholesterol   . IBS (irritable bowel syndrome)   . Mitral valve prolapse   . Uterus, adenomyosis    Past Surgical History:  Procedure Laterality Date  . CARPAL TUNNEL RELEASE Right   . COLONOSCOPY    . COLONOSCOPY N/A 12/13/2015   Procedure: COLONOSCOPY;  Surgeon: Rogene Houston, MD;  Location: [facility];  Service: Endoscopy;  Laterality: N/A;  9:55   . Fatty tumor     2011 (left arm)  . TONSILLECTOMY    . TOTAL ABDOMINAL HYSTERECTOMY     2003    SOCIAL HISTORY:  Social History   Socioeconomic History  . Marital status: Single    Spouse name: Not on file  . Number of children: Not on file  . Years of education: Not on file  . Highest education level: Not on file  Occupational History  . Occupation: retired  Tobacco Use  . Smoking status: Former Smoker    Packs/day: 0.50    Years: 33.00    Pack years: 16.50    Types: Cigarettes  . Smokeless tobacco: Never Used  . Tobacco comment: quit 9 yrs ago  Vaping Use  . Vaping Use: Never used  Substance and Sexual Activity  . Alcohol use: No    Alcohol/week: 0.0 standard drinks  . Drug use: No  . Sexual activity: Not Currently  Other Topics Concern  . Not on file  Social History Narrative  . Not on file   Social Determinants of Health   Financial Resource Strain:   . Difficulty of Paying Living Expenses: Not on file  Food Insecurity:   . Worried About Charity fundraiser in the Last Year: Not on file  . Ran Out of Food in the Last Year: Not on file  Transportation Needs:   . Lack of Transportation (Medical): Not on file  . Lack of Transportation (Non-Medical): Not  on file  Physical Activity:   . Days of Exercise per Week: Not on file  . Minutes of Exercise per Session: Not on file  Stress:   . Feeling of Stress : Not on file  Social Connections:   . Frequency of Communication with Friends and Family: Not on file  . Frequency of Social Gatherings with Friends and Family: Not on file  . Attends Religious Services: Not on file  . Active Member of Clubs or Organizations: Not on file  . Attends Archivist Meetings: Not on file  . Marital Status: Not on file  Intimate Partner Violence:   . Fear of Current or Ex-Partner: Not on file  . Emotionally Abused: Not on file  . Physically Abused: Not on file  . Sexually Abused: Not on file    FAMILY HISTORY:   Family History  Problem Relation Age of Onset  . Colon cancer Other   . Bone cancer Mother   . Brain cancer Father   . Brain cancer Sister   . Breast cancer Sister   . Renal Disease Sister     CURRENT MEDICATIONS:  Current Outpatient Medications  Medication Sig Dispense Refill  . atorvastatin (LIPITOR) 10 MG tablet Take 1 tablet (10 mg total) by mouth daily. (Patient taking differently: Take 5 mg by mouth daily. ) 90 tablet 1  . chlorzoxazone (PARAFON FORTE) 250 MG tablet Take 1 tablet (250 mg total) by mouth 4 (four) times daily as needed for muscle spasms. 60 tablet 3  . diazepam (VALIUM) 5 MG tablet Take 0.5-1 tablets (2.5-5 mg total) by mouth every 8 (eight) hours as needed. 30 tablet 0  . diphenoxylate-atropine (LOMOTIL) 2.5-0.025 MG tablet Take 1-2 tablets by mouth 4 (four) times daily as needed for diarrhea or loose stools. 30 tablet 0  . gabapentin (NEURONTIN) 100 MG capsule Take 100 mg by mouth 3 (three) times daily. Pt takes as needed    . LORazepam (ATIVAN) 0.5 MG tablet TAKE 1/2 TO 1 TABLET(0.25 TO 0.5 MG) BY MOUTH THREE TIMES DAILY AS NEEDED FOR ANXIETY 30 tablet 1  . meloxicam (MOBIC) 15 MG tablet TAKE 1/2 TO 1 TABLET(7.5 TO 15 MG) BY MOUTH DAILY AS NEEDED FOR PAIN 30 tablet 6  . PARoxetine (PAXIL) 10 MG tablet Take 1 tablet (10 mg total) by mouth daily. 90 tablet 1  . traZODone (DESYREL) 50 MG tablet Take 1-2 tablets (50-100 mg total) by mouth at bedtime as needed for sleep. 60 tablet 6   No current facility-administered medications for this visit.    ALLERGIES:  Allergies  Allergen Reactions  . Codeine Other (See Comments)    Stomach pains/constipation  . Hydrocodone Other (See Comments)    She does not want to take this due to intolerance to codeine.  . Methotrexate Derivatives Other (See Comments)    Agitation; pt stated, "I get no sleep; one dose caused no sleep for 7 days and 7 nights - that was when I got a 0.5 injection"    PHYSICAL EXAM:  Performance  status (ECOG): 1 - Symptomatic but completely ambulatory  Vitals:   06/29/20 1525  BP: 113/67  Pulse: 97  Resp: 16  Temp: (!) 97.2 F (36.2 C)  SpO2: 99%   Wt Readings from Last 3 Encounters:  06/29/20 76 lb 15.1 oz (34.9 kg)  06/22/20 79 lb (35.8 kg)  06/14/20 79 lb 9.6 oz (36.1 kg)   Physical Exam  LABORATORY DATA:  I have reviewed the labs as  listed.  CBC Latest Ref Rng & Units 06/28/2020 03/19/2020 05/15/2019  WBC 4.0 - 10.5 K/uL 15.2(H) 13.1(H) 11.8(H)  Hemoglobin 12.0 - 15.0 g/dL 14.0 11.4(L) 12.4  Hematocrit 36 - 46 % 43.4 33.7(L) 37.1  Platelets 150 - 400 K/uL 418(H) 431(H) 345   CMP Latest Ref Rng & Units 04/20/2020 03/19/2020 11/06/2019  Glucose 65 - 99 mg/dL 87 87 121(H)  BUN 7 - 25 mg/dL - 11 10  Creatinine 0.50 - 0.99 mg/dL - 0.73 0.79  Sodium 135 - 146 mmol/L - 140 141  Potassium 3.5 - 5.3 mmol/L - 4.0 3.7  Chloride 98 - 110 mmol/L - 102 101  CO2 20 - 32 mmol/L - 27 25  Calcium 8.6 - 10.4 mg/dL - 9.5 9.4  Total Protein 6.1 - 8.1 g/dL - 7.3 -  Total Bilirubin 0.2 - 1.2 mg/dL - 0.3 -  AST 10 - 35 U/L - 18 -  ALT 6 - 29 U/L - 8 -      Component Value Date/Time   RBC 4.84 06/28/2020 1229   MCV 89.7 06/28/2020 1229   MCH 28.9 06/28/2020 1229   MCHC 32.3 06/28/2020 1229   RDW 17.5 (H) 06/28/2020 1229   LYMPHSABS 3.9 06/28/2020 1229   MONOABS 0.8 06/28/2020 1229   EOSABS 0.1 06/28/2020 1229   BASOSABS 0.1 06/28/2020 1229   Lab Results  Component Value Date   TIBC 287 06/28/2020   TIBC 390 03/19/2020   TIBC 302 05/15/2019   FERRITIN 428 (H) 06/28/2020   FERRITIN 7 (L) 03/19/2020   FERRITIN 22 05/15/2019   IRONPCTSAT 29 06/28/2020   IRONPCTSAT 10 (L) 03/19/2020   IRONPCTSAT 21 05/15/2019    DIAGNOSTIC IMAGING:  I have independently reviewed the scans and discussed with the patient. CT CHEST ABDOMEN PELVIS W CONTRAST  Result Date: 06/23/2020 CLINICAL DATA:  Unintentional 35 lb weight loss in past 6-8 months. Fatigue. EXAM: CT CHEST, ABDOMEN, AND  PELVIS WITH CONTRAST TECHNIQUE: Multidetector CT imaging of the chest, abdomen and pelvis was performed following the standard protocol during bolus administration of intravenous contrast. CONTRAST:  23m ISOVUE-300 IOPAMIDOL (ISOVUE-300) INJECTION 61% [facility]  [facility]IND[facility]: CT CHEST FINDINGS Cardiovascular: No acute findings. Aortic atherosclerosis noted. Mediastinum/Lymph Nodes: No masses or pathologically enlarged lymph nodes identified. Lungs/Pleura: Moderate centrilobular emphysema noted. No pulmonary infiltrate or mass identified. No effusion present. Musculoskeletal:  No suspicious bone lesions identified. CT ABDOMEN AND PELVIS FINDINGS Hepatobiliary: No masses identified. Gallbladder is unremarkable. No evidence of biliary ductal dilatation. Pancreas:  No mass or inflammatory changes. Spleen:  Within normal limits in size and appearance. Adrenals/Urinary tract: No masses or hydronephrosis. Small cyst again noted in lower pole of right kidney. Stomach/Bowel: No evidence of obstruction, inflammatory process, or abnormal fluid collections. Vascular/Lymphatic: No pathologically enlarged lymph nodes identified. No abdominal aortic aneurysm. Aortic atherosclerosis noted. Reproductive: Prior hysterectomy noted. Adnexal regions are unremarkable in appearance. Other:  None. Musculoskeletal: No suspicious bone lesions identified. Linear sclerosis in the right sacral ala is consistent with an insufficiency fracture. IMPRESSION: No evidence of neoplasm or other acute findings within the chest, abdomen, or pelvis. Right sacral insufficiency fracture incidentally noted. Aortic Atherosclerosis (ICD10-I70.0) and Emphysema (ICD10-J43.9). Electronically Signed   By: JMarlaine HindM.D.   On: 06/23/2020 11:05   [place]ided Needle Placement  Result Date: 06/22/2020 Ultrasound-guided bilateral glenohumeral injection: After sterile prep with Betadine, injected 8 cc 1% lidocaine  without epinephrine and 40 mg methylprednisolone using a  22-gauge spinal needle, passing the needle from posterior approach into the glenohumeral joint.  Injectate seen filling joint capsules.  Had itching of skin of arms afterward, was given 12.5 mg benadryl.    MM DIGITAL SCREENING BILATERAL  Result Date: 06/24/2020 CLINICAL DATA:  Screening. EXAM: DIGITAL SCREENING BILATERAL MAMMOGRAM WITH CAD COMPARISON:  Previous exam(s). ACR Breast Density Category d: The breast tissue is extremely dense, which lowers the sensitivity of mammography FINDINGS: There are no findings suspicious for malignancy. Images were processed with CAD. IMPRESSION: No mammographic evidence of malignancy. A result letter of this screening mammogram will be mailed directly to the patient. RECOMMENDATION: Screening mammogram in one year. (Code:SM-B-01Y) BI-RADS CATEGORY  1: Negative. Electronically Signed   By: Lajean Manes M.D.   On: 06/24/2020 12:00     ASSESSMENT:  1.  Normocytic anemia: -Status post last Feraheme infusion on 04/22/2020. -We reviewed her prior anemia work-up which showed normal U88, folic acid and methylmalonic acid levels.  Copper levels were normal.  SPEP was negative. -Feraheme on 06/14/2020.  2.  Osteoporosis: -DEXA scan on 04/13/2020 shows T score -4.2. -I discussed about her new diagnosis.  I have also recommended Prolia every 6 months. -She will see her dentist and get clearance.  3.  Weight loss: -She is continuously losing weight. -CT CAP on 06/23/2020 did not show any evidence of malignancy in the chest, abdomen or pelvis.  Right sacral insufficiency fracture. -Mammogram on 06/23/2020 was negative. -Colonoscopy on 12/13/2015 showed normal colon with nonbleeding external hemorrhoids.  4.  Rheumatoid arthritis: -She is on Mobic and Celebrex.   PLAN:  1.  Normocytic anemia: -We reviewed results from 06/28/2020.  Hemoglobin improved to 14.  Ferritin is 428 with percent saturation of 29. -No  further parenteral iron therapy required.  2.  Leukocytosis: -Her previous CBC showed slightly elevated white count, predominantly neutrophils. -Recommend myeloproliferative disorder work-up with JAK2 V617F testing.  We will also repeat LDH which was previously normal.  3.  Weight loss: -She was very frustrated with weight loss.  No reason was found so far. -She insists that she has been eating same portions for the last few years.  She cannot tolerate any fatty foods or milk shakes because of her IBS.  She met with our dietitian on 05/21/2020. -CT CAP was normal.  Mammogram on 06/23/2020 was negative. -I have recommended PET CT scan to evaluate for any lymphomas. -We also talked about galleri test for diagnosing any occult cancers given her family history.  We will also consider ordering it. -We will see her back after the PET scan.   Orders placed this encounter:  No orders of the defined types were placed in this encounter.    Derek Jack, MD [place] [phone]   I, Milinda Antis, am acting as a scribe for Dr. Sanda Linger.  I, Derek Jack MD, have reviewed the above documentation for accuracy and completeness, and I agree with the above.

## 2020-06-30 ENCOUNTER — Other Ambulatory Visit: Payer: Self-pay

## 2020-06-30 ENCOUNTER — Inpatient Hospital Stay (HOSPITAL_COMMUNITY): Payer: Medicaid Other

## 2020-06-30 ENCOUNTER — Other Ambulatory Visit (HOSPITAL_COMMUNITY): Payer: BC Managed Care – PPO

## 2020-06-30 DIAGNOSIS — D509 Iron deficiency anemia, unspecified: Secondary | ICD-10-CM

## 2020-06-30 DIAGNOSIS — D649 Anemia, unspecified: Secondary | ICD-10-CM | POA: Diagnosis not present

## 2020-06-30 LAB — LACTATE DEHYDROGENASE: LDH: 144 U/L (ref 98–192)

## 2020-07-02 ENCOUNTER — Ambulatory Visit: Payer: BC Managed Care – PPO | Admitting: Family Medicine

## 2020-07-07 LAB — CALR + JAK2 E12-15 + MPL (REFLEXED)

## 2020-07-07 LAB — JAK2 V617F, W REFLEX TO CALR/E12/MPL

## 2020-07-08 ENCOUNTER — Telehealth: Payer: Self-pay

## 2020-07-08 NOTE — Telephone Encounter (Signed)
I called the patient. She should not take more than 15 mg of meloxicam daily. She asked about taking Tylenol with it. I suggested she take it 4-6 hours later, if needed. Her shoulders have been hurting a badly, with stiffness, especially first thing in the morning. She has gabapentin, but has not been taking it. I suggested she try taking one at bedtime to see if it helps with the morning pain. The patient said she will try this and let us know if there is no improvement. She has her PET scan in the morning (ordered by Dr. Delton Coombes) with follow up on Monday at that office.

## 2020-07-08 NOTE — Telephone Encounter (Signed)
Patient called in asking  how bad is it for her to take 1 1/2 pill of meloxicam . Says she is having pain in left shoulder that's really bad

## 2020-07-09 ENCOUNTER — Other Ambulatory Visit: Payer: Self-pay

## 2020-07-09 ENCOUNTER — Ambulatory Visit (HOSPITAL_COMMUNITY)
Admission: RE | Admit: 2020-07-09 | Discharge: 2020-07-09 | Disposition: A | Discharge status: Home / Self Care | Payer: Medicaid Other | Source: Ambulatory Visit | Attending: Hematology | Admitting: Hematology

## 2020-07-09 DIAGNOSIS — R634 Abnormal weight loss: Secondary | ICD-10-CM | POA: Insufficient documentation

## 2020-07-09 LAB — GLUCOSE, CAPILLARY: Glucose-Capillary: 95 mg/dL (ref 70–99)

## 2020-07-09 MED ORDER — FLUDEOXYGLUCOSE F - 18 (FDG) INJECTION
4.9000 | Freq: Once | INTRAVENOUS | Status: AC
  Administered 2020-07-09: 4.9 via INTRAVENOUS

## 2020-07-12 ENCOUNTER — Inpatient Hospital Stay (HOSPITAL_COMMUNITY): Payer: Medicaid Other | Attending: Hematology | Admitting: Hematology

## 2020-07-12 ENCOUNTER — Other Ambulatory Visit: Payer: Self-pay

## 2020-07-12 VITALS — BP 119/66 | HR 94 | Temp 96.6°F | Resp 16 | Wt 77.6 lb

## 2020-07-12 DIAGNOSIS — J439 Emphysema, unspecified: Secondary | ICD-10-CM | POA: Insufficient documentation

## 2020-07-12 DIAGNOSIS — Z800 Family history of malignant neoplasm of digestive organs: Secondary | ICD-10-CM | POA: Insufficient documentation

## 2020-07-12 DIAGNOSIS — F329 Major depressive disorder, single episode, unspecified: Secondary | ICD-10-CM | POA: Insufficient documentation

## 2020-07-12 DIAGNOSIS — Z808 Family history of malignant neoplasm of other organs or systems: Secondary | ICD-10-CM | POA: Diagnosis not present

## 2020-07-12 DIAGNOSIS — K589 Irritable bowel syndrome without diarrhea: Secondary | ICD-10-CM | POA: Insufficient documentation

## 2020-07-12 DIAGNOSIS — E7800 Pure hypercholesterolemia, unspecified: Secondary | ICD-10-CM | POA: Insufficient documentation

## 2020-07-12 DIAGNOSIS — Z79899 Other long term (current) drug therapy: Secondary | ICD-10-CM | POA: Insufficient documentation

## 2020-07-12 DIAGNOSIS — I509 Heart failure, unspecified: Secondary | ICD-10-CM | POA: Diagnosis not present

## 2020-07-12 DIAGNOSIS — M810 Age-related osteoporosis without current pathological fracture: Secondary | ICD-10-CM

## 2020-07-12 DIAGNOSIS — Z803 Family history of malignant neoplasm of breast: Secondary | ICD-10-CM | POA: Insufficient documentation

## 2020-07-12 DIAGNOSIS — M069 Rheumatoid arthritis, unspecified: Secondary | ICD-10-CM | POA: Diagnosis not present

## 2020-07-12 DIAGNOSIS — R634 Abnormal weight loss: Secondary | ICD-10-CM

## 2020-07-12 DIAGNOSIS — D649 Anemia, unspecified: Secondary | ICD-10-CM | POA: Insufficient documentation

## 2020-07-12 DIAGNOSIS — D509 Iron deficiency anemia, unspecified: Secondary | ICD-10-CM

## 2020-07-12 DIAGNOSIS — Z90710 Acquired absence of both cervix and uterus: Secondary | ICD-10-CM | POA: Insufficient documentation

## 2020-07-12 DIAGNOSIS — Z87891 Personal history of nicotine dependence: Secondary | ICD-10-CM | POA: Diagnosis not present

## 2020-07-12 MED ORDER — AZITHROMYCIN 250 MG PO TABS: ORAL_TABLET | ORAL | 0 refills | Status: DC

## 2020-07-12 NOTE — Patient Instructions (Addendum)
[place] at [facility] Discharge Instructions  You were seen today by Dr. Delton Coombes. He went over your recent results and scan. Your leukemia blood work was negative. You will be prescribed Z-Pack for your armpit lymph node. See a gastroenterologist as soon as possible. You can visit the [facility] website at [email or web address] to learn more about the blood test. Dr. Delton Coombes will see you back in 4 to 6 weeks for follow up.   Thank you for choosing [place] at [facility] to provide your oncology and hematology care.  To afford each patient quality time with our provider, please arrive at least 15 minutes before your scheduled appointment time.   If you have a lab appointment with the [facility] please come in thru the Main Entrance and check in at the main information desk  You need to re-schedule your appointment should you arrive 10 or more minutes late.  We strive to give you quality time with our providers, and arriving late affects you and other patients whose appointments are after yours.  Also, if you no show three or more times for appointments you may be dismissed from the clinic at the providers discretion.     Again, thank you for choosing [facility].  Our hope is that these requests will decrease the amount of time that you wait before being seen by our physicians.       _____________________________________________________________  Should you have questions after your visit to [facility], please contact our office at [phone] between the hours of 8:00 a.m. and 4:30 p.m.  Voicemails left after 4:00 p.m. will not be returned until the following business day.  For prescription refill requests, have your pharmacy contact our office and allow 72 hours.    Cancer Center Support Programs:   > Cancer Support Group  2nd Tuesday of the month 1pm-2pm, Journey Room

## 2020-07-12 NOTE — Progress Notes (Signed)
Vanessa Lara, Vanessa Lara   CLINIC:  Medical Oncology/Hematology  PCP:  Vanessa Blase, MD [address] [place] [address]  [phone]  REASON FOR VISIT:  Follow-up for IDA and osteoporosis  PRIOR THERAPY: None  CURRENT THERAPY: Intermittent Feraheme  INTERVAL HISTORY:  Vanessa Lara, a 64 y.o. female, returns for routine follow-up for her IDA and osteoporosis. Vanessa Lara was last seen on 06/29/2020.  Today she reports feeling cold. She reports getting occasional pain in her left inguinal area, which resolves when she has a BM. She has never been prescribed Z-Pack.  She has cats at home; she gets occasional scratches and skin breakage when playing with her cats.   REVIEW OF SYSTEMS:  Review of Systems  Constitutional: Positive for appetite change (50%). Negative for fatigue.  Gastrointestinal: Positive for constipation and diarrhea.  Musculoskeletal: Positive for arthralgias (5/10 arthritic joint pain).  Psychiatric/Behavioral: Positive for sleep disturbance.  All other systems reviewed and are negative.   PAST MEDICAL/SURGICAL HISTORY:  Past Medical History:  Diagnosis Date  . Anemia   . Arthritis   . Cancer (HCC)    Skin  . CHF (congestive heart failure) ([place])   . Collagen vascular disease ([place])   . Depression   . Endometriosis   . High cholesterol   . IBS (irritable bowel syndrome)   . Mitral valve prolapse   . Uterus, adenomyosis    Past Surgical History:  Procedure Laterality Date  . CARPAL TUNNEL RELEASE Right   . COLONOSCOPY    . COLONOSCOPY N/A 12/13/2015   Procedure: COLONOSCOPY;  Surgeon: Rogene Houston, MD;  Location: [facility];  Service: Endoscopy;  Laterality: N/A;  9:55  . Fatty tumor     2011 (left arm)  . TONSILLECTOMY    . TOTAL ABDOMINAL HYSTERECTOMY     2003    SOCIAL HISTORY:  Social History   Socioeconomic History  . Marital status: Single    Spouse name: Not on file  .  Number of children: Not on file  . Years of education: Not on file  . Highest education level: Not on file  Occupational History  . Occupation: retired  Tobacco Use  . Smoking status: Former Smoker    Packs/day: 0.50    Years: 33.00    Pack years: 16.50    Types: Cigarettes  . Smokeless tobacco: Never Used  . Tobacco comment: quit 9 yrs ago  Vaping Use  . Vaping Use: Never used  Substance and Sexual Activity  . Alcohol use: No    Alcohol/week: 0.0 standard drinks  . Drug use: No  . Sexual activity: Not Currently  Other Topics Concern  . Not on file  Social History Narrative  . Not on file   Social Determinants of Health   Financial Resource Strain:   . Difficulty of Paying Living Expenses: Not on file  Food Insecurity:   . Worried About Charity fundraiser in the Last Year: Not on file  . Ran Out of Food in the Last Year: Not on file  Transportation Needs:   . Lack of Transportation (Medical): Not on file  . Lack of Transportation (Non-Medical): Not on file  Physical Activity:   . Days of Exercise per Week: Not on file  . Minutes of Exercise per Session: Not on file  Stress:   . Feeling of Stress : Not on file  Social Connections:   . Frequency of  Communication with Friends and Family: Not on file  . Frequency of Social Gatherings with Friends and Family: Not on file  . Attends Religious Services: Not on file  . Active Member of Clubs or Organizations: Not on file  . Attends Archivist Meetings: Not on file  . Marital Status: Not on file  Intimate Partner Violence:   . Fear of Current or Ex-Partner: Not on file  . Emotionally Abused: Not on file  . Physically Abused: Not on file  . Sexually Abused: Not on file    FAMILY HISTORY:  Family History  Problem Relation Age of Onset  . Colon cancer Other   . Bone cancer Mother   . Brain cancer Father   . Brain cancer Sister   . Breast cancer Sister   . Renal Disease Sister     CURRENT MEDICATIONS:    Current Outpatient Medications  Medication Sig Dispense Refill  . atorvastatin (LIPITOR) 10 MG tablet Take 1 tablet (10 mg total) by mouth daily. (Patient taking differently: Take 5 mg by mouth daily. ) 90 tablet 1  . chlorzoxazone (PARAFON FORTE) 250 MG tablet Take 1 tablet (250 mg total) by mouth 4 (four) times daily as needed for muscle spasms. 60 tablet 3  . meloxicam (MOBIC) 15 MG tablet TAKE 1/2 TO 1 TABLET(7.5 TO 15 MG) BY MOUTH DAILY AS NEEDED FOR PAIN 30 tablet 6  . PARoxetine (PAXIL) 10 MG tablet Take 1 tablet (10 mg total) by mouth daily. 90 tablet 1  . traZODone (DESYREL) 50 MG tablet Take 1-2 tablets (50-100 mg total) by mouth at bedtime as needed for sleep. (Patient taking differently: Take 25-50 mg by mouth at bedtime as needed for sleep. ) 60 tablet 6  . diazepam (VALIUM) 5 MG tablet Take 0.5-1 tablets (2.5-5 mg total) by mouth every 8 (eight) hours as needed. (Patient not taking: Reported on 07/12/2020) 30 tablet 0  . diphenoxylate-atropine (LOMOTIL) 2.5-0.025 MG tablet Take 1-2 tablets by mouth 4 (four) times daily as needed for diarrhea or loose stools. (Patient not taking: Reported on 07/12/2020) 30 tablet 0  . furosemide (LASIX) 20 MG tablet Take 20 mg by mouth daily as needed. (Patient not taking: Reported on 07/12/2020)    . gabapentin (NEURONTIN) 100 MG capsule Take 100 mg by mouth 3 (three) times daily. Pt takes as needed (Patient not taking: Reported on 07/12/2020)    . LORazepam (ATIVAN) 0.5 MG tablet TAKE 1/2 TO 1 TABLET(0.25 TO 0.5 MG) BY MOUTH THREE TIMES DAILY AS NEEDED FOR ANXIETY (Patient not taking: Reported on 07/12/2020) 30 tablet 1   No current facility-administered medications for this visit.    ALLERGIES:  Allergies  Allergen Reactions  . Codeine Other (See Comments)    Stomach pains/constipation- due to IBS  . Hydrocodone Other (See Comments)    She does not want to take this due to intolerance to codeine.  . Methotrexate Derivatives Other (See  Comments)    Agitation; pt stated, "I get no sleep; one dose caused no sleep for 7 days and 7 nights - that was when I got a 0.5 injection"    PHYSICAL EXAM:  Performance status (ECOG): 1 - Symptomatic but completely ambulatory  Vitals:   07/12/20 1123  BP: 119/66  Pulse: 94  Resp: 16  Temp: (!) 96.6 F (35.9 C)  SpO2: 99%   Wt Readings from Last 3 Encounters:  07/12/20 77 lb 9.6 oz (35.2 kg)  06/29/20 76 lb 15.1 oz (34.9 kg)  06/22/20 79 lb (35.8 kg)   Physical Exam Abdominal:     Palpations: Abdomen is soft. There is no hepatomegaly, splenomegaly or mass.     Tenderness: There is no abdominal tenderness.     Hernia: No hernia is present.  Lymphadenopathy:     Upper Body:     Right upper body: Axillary adenopathy present.     LABORATORY DATA:  I have reviewed the labs as listed.  CBC Latest Ref Rng & Units 06/28/2020 03/19/2020 05/15/2019  WBC 4.0 - 10.5 K/uL 15.2(H) 13.1(H) 11.8(H)  Hemoglobin 12.0 - 15.0 g/dL 14.0 11.4(L) 12.4  Hematocrit 36 - 46 % 43.4 33.7(L) 37.1  Platelets 150 - 400 K/uL 418(H) 431(H) 345   CMP Latest Ref Rng & Units 04/20/2020 03/19/2020 11/06/2019  Glucose 65 - 99 mg/dL 87 87 121(H)  BUN 7 - 25 mg/dL - 11 10  Creatinine 0.50 - 0.99 mg/dL - 0.73 0.79  Sodium 135 - 146 mmol/L - 140 141  Potassium 3.5 - 5.3 mmol/L - 4.0 3.7  Chloride 98 - 110 mmol/L - 102 101  CO2 20 - 32 mmol/L - 27 25  Calcium 8.6 - 10.4 mg/dL - 9.5 9.4  Total Protein 6.1 - 8.1 g/dL - 7.3 -  Total Bilirubin 0.2 - 1.2 mg/dL - 0.3 -  AST 10 - 35 U/L - 18 -  ALT 6 - 29 U/L - 8 -      Component Value Date/Time   RBC 4.84 06/28/2020 1229   MCV 89.7 06/28/2020 1229   MCH 28.9 06/28/2020 1229   MCHC 32.3 06/28/2020 1229   RDW 17.5 (H) 06/28/2020 1229   LYMPHSABS 3.9 06/28/2020 1229   MONOABS 0.8 06/28/2020 1229   EOSABS 0.1 06/28/2020 1229   BASOSABS 0.1 06/28/2020 1229   Lab Results  Component Value Date   LDH 144 06/30/2020   LDH 153 04/09/2020    DIAGNOSTIC  IMAGING:  I have independently reviewed the scans and discussed with the patient. CT CHEST ABDOMEN PELVIS W CONTRAST  Result Date: 06/23/2020 CLINICAL DATA:  Unintentional 35 lb weight loss in past 6-8 months. Fatigue. EXAM: CT CHEST, ABDOMEN, AND PELVIS WITH CONTRAST TECHNIQUE: Multidetector CT imaging of the chest, abdomen and pelvis was performed following the standard protocol during bolus administration of intravenous contrast. CONTRAST:  52m ISOVUE-300 IOPAMIDOL (ISOVUE-300) INJECTION 61% [facility]  [facility] CT CHEST FINDINGS Cardiovascular: No acute findings. Aortic atherosclerosis noted. Mediastinum/Lymph Nodes: No masses or pathologically enlarged lymph nodes identified. Lungs/Pleura: Moderate centrilobular emphysema noted. No pulmonary infiltrate or mass identified. No effusion present. Musculoskeletal:  No suspicious bone lesions identified. CT ABDOMEN AND PELVIS FINDINGS Hepatobiliary: No masses identified. Gallbladder is unremarkable. No evidence of biliary ductal dilatation. Pancreas:  No mass or inflammatory changes. Spleen:  Within normal limits in size and appearance. Adrenals/Urinary tract: No masses or hydronephrosis. Small cyst again noted in lower pole of right kidney. Stomach/Bowel: No evidence of obstruction, inflammatory process, or abnormal fluid collections. Vascular/Lymphatic: No pathologically enlarged lymph nodes identified. No abdominal aortic aneurysm. Aortic atherosclerosis noted. Reproductive: Prior hysterectomy noted. Adnexal regions are unremarkable in appearance. Other:  None. Musculoskeletal: No suspicious bone lesions identified. Linear sclerosis in the right sacral ala is consistent with an insufficiency fracture. IMPRESSION: No evidence of neoplasm or other acute findings within the chest, abdomen, or pelvis. Right sacral insufficiency fracture incidentally noted. Aortic Atherosclerosis (ICD10-I70.0) and Emphysema  (ICD10-J43.9). Electronically Signed   By: JMyles RosenthalD.  On: 06/23/2020 11:05   NM PET Image Initial (PI) Skull Base To Thigh  Result Date: 07/09/2020 CLINICAL DATA:  Initial treatment strategy for unintentional weight loss. EXAM: NUCLEAR MEDICINE PET SKULL BASE TO THIGH TECHNIQUE: 4.9 mCi F-18 FDG was injected intravenously. Full-ring PET imaging was performed from the skull base to thigh after the radiotracer. CT data was obtained and used for attenuation correction and anatomic localization. Fasting blood glucose: 95 mg/dl COMPARISON:  CT examination from 06/23/2020 FINDINGS: Mediastinal blood pool activity: SUV max 1.5 Liver activity: SUV max 2.0 NECK: No significant abnormal hypermetabolic activity in this region. Incidental CT findings: Chronic left maxillary sinusitis. Bilateral common carotid atherosclerotic calcification. CHEST: Right axillary lymph node measuring 1.1 cm in short axis on image 63 of series 4 has a maximum SUV of 4.6 ([place] 4). This is stable in size compared to the prior right shoulder MRI from 12/05/2019. Other smaller right axillary and subpectoral lymph nodes have low-grade activity, for example a 0.6 cm right subpectoral node on image 57 of series 4 has maximum SUV of 1.9 (De[place] 3). Focal activity along the lateral margin of the right trapezius/rhomboid major adjacent to the medial scapula is noted without underlying CT abnormality, maximum SUV 3.0. This is probably physiologic muscular activity but does appear somewhat focal. No adjacent bony abnormality is observed. Incidental CT findings: Centrilobular emphysema. Atherosclerotic thoracic aorta. 4 mm sub solid nodule in the left lower lobe on image 24 of series a without appreciable hypermetabolic activity. ABDOMEN/PELVIS: In the vicinity of the ileocecal valve/terminal ileum, there is focal accentuated metabolic activity with maximum SUV of 9.2. Reviewing this area on the recent CT scan from 06/23/2020 I do not see  obvious mass lesion, although the area is more indistinct on the CT data from today's examination. This may well simply be focal physiologic activity, neoplasm involving the ileocecal valve region is a consideration given the focal nature of the accentuated activity. Incidental CT findings: Aortoiliac atherosclerotic vascular disease. Normal spleen. Probable cyst of the right kidney lower pole laterally. Prominent stool throughout the colon favors constipation. SKELETON: Activity along the wrists and hands attributable to arthropathy. Incidental CT findings: none IMPRESSION: 1. Mildly hypermetabolic right axillary lymph node with maximum SUV of 4.6. Borderline accentuated metabolic activity in small right axillary and subpectoral lymph nodes. Significance of this hypermetabolic activity is uncertain; no hypermetabolic breast mass is identified. Conceivably a low-grade lymphoma could cause some localized adenopathy, the appearance is not considered specific. 2. Accentuated activity along the distal most terminal ileum and ileocecal valve. This may well be physiologic given that I not see a mass or specific abnormality in this region on the recent diagnostic CT of 06/23/2020, but the activity is focal enough to raise concern. Correlate with history of colon cancer screening in determining whether further workup such as colonoscopy is warranted. 3. Nonspecific 4 mm sub solid nodule in the right lower lobe without appreciable hypermetabolic activity, but below sensitive PET-CT size thresholds. No follow-up needed if patient is low-risk. Non-contrast chest CT can be considered in 12 months if patient is high-risk. This recommendation follows the consensus statement: Guidelines for Management of Incidental Pulmonary Nodules Detected on CT Images: From the Fleischner Society 2017; Radiology 2017; 284:228-243. 4. Other imaging findings of potential clinical significance: Aortic Atherosclerosis (ICD10-I70.0). Prominent stool  throughout the colon favors constipation. Arthropathy related activity in the wrists and hands. Chronic left maxillary sinusitis. Emphysema (ICD10-J43.9). Electronically Signed   By: Van Clines M.D.   On: 07/09/2020  15:03   US Guided Needle Placement  Result Date: 06/22/2020 Ultrasound-guided bilateral glenohumeral injection: After sterile prep with Betadine, injected 8 cc 1% lidocaine without epinephrine and 40 mg methylprednisolone using a 22-gauge spinal needle, passing the needle from posterior approach into the glenohumeral joint.  Injectate seen filling joint capsules.  Had itching of skin of arms afterward, was given 12.5 mg benadryl.    MM DIGITAL SCREENING BILATERAL  Result Date: 06/24/2020 CLINICAL DATA:  Screening. EXAM: DIGITAL SCREENING BILATERAL MAMMOGRAM WITH CAD COMPARISON:  Previous exam(s). ACR Breast Density Category d: The breast tissue is extremely dense, which lowers the sensitivity of mammography FINDINGS: There are no findings suspicious for malignancy. Images were processed with CAD. IMPRESSION: No mammographic evidence of malignancy. A result letter of this screening mammogram will be mailed directly to the patient. RECOMMENDATION: Screening mammogram in one year. (Code:SM-B-01Y) BI-RADS CATEGORY  1: Negative. Electronically Signed   By: Lajean Manes M.D.   On: 06/24/2020 12:00     ASSESSMENT:  1. Normocytic anemia: -Status post last Feraheme infusion on 04/22/2020. -We reviewed her prior anemia work-up which showed normal J47, folic acid and methylmalonic acid levels. Copper levels were normal. SPEP was negative. -Feraheme on 06/14/2020.  2. Osteoporosis: -DEXA scan on 04/13/2020 shows T score -4.2. -I discussed about her new diagnosis. I have also recommended Prolia every 6 months. -She will see her dentist and get clearance.  3. Weight loss: -She is continuously losing weight. -CT CAP on 06/23/2020 did not show any evidence of malignancy in the chest,  abdomen or pelvis.  Right sacral insufficiency fracture. -Mammogram on 06/23/2020 was negative. -Colonoscopy on 12/13/2015 showed normal colon with nonbleeding external hemorrhoids.  4.  Rheumatoid arthritis: -She is on Mobic and Celebrex.   PLAN:  1. Normocytic anemia: -Labs from 06/28/2020 shows hemoglobin improved to 14.  Ferritin was 428 and percent saturation was 29.  No parenteral iron therapy required.  2.    JAK2 V617F negative leukocytosis: -Her CBC showed mildly elevated white count, predominantly neutrophils. -We have done work-up for myeloproliferative disorders which showed JAK2 V617F and reflex testing to be negative.  LDH was normal.  3. Weight loss: -She has been losing weight steadily. -Her diet is restricted because of IBS.  She cannot tolerate any fatty foods or milk shakes.  She met with our dietitian on 05/21/2020. -Mammogram on 06/23/2020 was negative.  CT CAP was normal. -We reviewed results of PET scan from 07/09/2020 which showed mildly hypermetabolic right axillary lymph node with SUV 4.6.  There is accentuated activity in the terminal ileum and ileocecal valve but without focal lesion on CT.  Nonspecific 4 mm subsolid nodule in the right lower lobe below the PET detection. -I have recommended her to follow-up with GI for colonoscopy to look at the ileocecal area. -She reportedly has cats and kittens at home.  She reports that she had sustained a scratch and bite from the cat few days ago. -Right axillary adenopathy is isolated and could be related to that.  The lymph node is palpable.  This is nontender. -I have recommended trying antibiotic azithromycin for 5 days. -Given her family history of malignancies and weight loss which is unexplained, recommended Galleri testing.  Patient would like to proceed even though not covered by insurance.  We will try to get the test kit. -Plan to follow-up in 4 weeks.  Orders placed this encounter:  No orders of the defined  types were placed in this encounter.   , MD [facility] [phone]   I, Daniel Khashchuk, am acting as a scribe for Dr.  Katagadda.  I,   MD, have reviewed the above documentation for accuracy and completeness, and I agree with the above.     

## 2020-07-13 ENCOUNTER — Ambulatory Visit: Payer: BC Managed Care – PPO | Admitting: Family Medicine

## 2020-07-13 ENCOUNTER — Other Ambulatory Visit (HOSPITAL_COMMUNITY): Payer: BC Managed Care – PPO

## 2020-07-14 ENCOUNTER — Ambulatory Visit (INDEPENDENT_AMBULATORY_CARE_PROVIDER_SITE_OTHER): Payer: BC Managed Care – PPO | Admitting: Family Medicine

## 2020-07-14 ENCOUNTER — Other Ambulatory Visit: Payer: Self-pay

## 2020-07-14 ENCOUNTER — Encounter: Payer: Self-pay | Admitting: Family Medicine

## 2020-07-14 VITALS — BP 91/58 | HR 108 | Ht 60.0 in | Wt 77.0 lb

## 2020-07-14 DIAGNOSIS — R634 Abnormal weight loss: Secondary | ICD-10-CM

## 2020-07-14 DIAGNOSIS — E785 Hyperlipidemia, unspecified: Secondary | ICD-10-CM | POA: Diagnosis not present

## 2020-07-14 DIAGNOSIS — R911 Solitary pulmonary nodule: Secondary | ICD-10-CM

## 2020-07-14 NOTE — Progress Notes (Signed)
Office Visit Note   Patient: Vanessa Lara           Date of Birth: 04-15-1956           MRN: [id number] Visit Date: 07/14/2020 Requested by: Eunice Blase, MD [address] [place],  [place] [address] PCP: Eunice Blase, MD  Subjective: Chief Complaint  Patient presents with  . f/u post PET scan - lung nodule found  . follow up weight loss    HPI: She is here to discuss recent PET scan.  She continues to lose weight unintentionally.  PET scan revealed a 4 mm left lower lobe lung nodule and a 1.1 cm right axillary lymph node.  The lymph node appears similar to MRI scan from March.  There is also metabolic activity at the ileocecal valve.  Patient is highly concerned that she has cancer.  She requests referral to a cancer center in [place] in order to have biopsies done.  Incidentally, she is due to have lipids rechecked since changing her dosage.               ROS:   All other systems were reviewed and are negative.  Objective: Vital Signs: BP (!) 91/58   Pulse (!) 108   Ht 5' (1.524 m)   Wt 77 lb (34.9 kg)   BMI 15.04 kg/m   Physical Exam:  General:  Alert and oriented, in no acute distress. Pulm:  Breathing unlabored. Psy:  Normal mood, congruent affect.  No exam done today.  Imaging: No results found.  Assessment & Plan: 1.  Unintended weight loss with axillary lymph node and small lung nodule, metabolic activity in ileocecal valve area.  Cannot rule out cancer. -Referral to cancer center in [place].  2.  Hyperlipidemia -Labs to recheck her levels on lower dosage.     Procedures: No procedures performed  No notes on file     PMFS History: Patient Active Problem List   Diagnosis Date Noted  . Osteoporosis 06/15/2020  . Iron deficiency anemia 04/09/2020  . Primary osteoarthritis involving multiple joints 12/31/2018  . Rheumatoid arthritis involving multiple sites with positive rheumatoid factor ([place]) 12/31/2018  . Irritable bowel syndrome with  both constipation and diarrhea 10/28/2018  . Constipation 03/14/2016  . Mitral valve prolapse 11/23/2015  . High cholesterol 11/23/2015   Past Medical History:  Diagnosis Date  . Anemia   . Arthritis   . Cancer (HCC)    Skin  . CHF (congestive heart failure) ([place])   . Collagen vascular disease ([place])   . Depression   . Endometriosis   . High cholesterol   . IBS (irritable bowel syndrome)   . Mitral valve prolapse   . Uterus, adenomyosis     Family History  Problem Relation Age of Onset  . Colon cancer Other   . Bone cancer Mother   . Brain cancer Father   . Brain cancer Sister   . Breast cancer Sister   . Renal Disease Sister     Past Surgical History:  Procedure Laterality Date  . CARPAL TUNNEL RELEASE Right   . COLONOSCOPY    . COLONOSCOPY N/A 12/13/2015   Procedure: COLONOSCOPY;  Surgeon: Rogene Houston, MD;  [facility];  Service: Endoscopy;  Laterality: N/A;  9:55  . Fatty tumor     2011 (left arm)  . TONSILLECTOMY    . TOTAL ABDOMINAL HYSTERECTOMY     2003   Social History   Occupational History  .  Occupation: retired  Tobacco Use  . Smoking status: Former Smoker    Packs/day: 0.50    Years: 33.00    Pack years: 16.50    Types: Cigarettes  . Smokeless tobacco: Never Used  . Tobacco comment: quit 9 yrs ago  Vaping Use  . Vaping Use: Never used  Substance and Sexual Activity  . Alcohol use: No    Alcohol/week: 0.0 standard drinks  . Drug use: No  . Sexual activity: Not Currently

## 2020-07-15 ENCOUNTER — Telehealth: Payer: Self-pay

## 2020-07-15 ENCOUNTER — Telehealth: Payer: Self-pay | Admitting: Family Medicine

## 2020-07-15 LAB — LIPID PANEL
Cholesterol: 191 mg/dL (ref <200)
HDL: 49 mg/dL — ABNORMAL LOW (ref >=50)
LDL Cholesterol (Calc): 114 mg/dL (calc) — ABNORMAL HIGH
Non-HDL Cholesterol (Calc): 142 mg/dL (calc) — ABNORMAL HIGH (ref <130)
Total CHOL/HDL Ratio: 3.9 (calc) (ref <5.0)
Triglycerides: 168 mg/dL — ABNORMAL HIGH (ref <150)

## 2020-07-15 NOTE — Telephone Encounter (Signed)
Labs show:  Triglycerides and LDL are a little too high.  Would suggest alternating 10 mg lipitor one day, then 5 mg the next, and so on.  Recheck in 6 months.

## 2020-07-15 NOTE — Telephone Encounter (Signed)
I called and advised the patient of the results and new directions on the Lipitor. She voiced understanding of the new directions twice in the phone call.

## 2020-07-19 ENCOUNTER — Encounter: Payer: Self-pay | Admitting: Orthopaedic Surgery

## 2020-07-19 ENCOUNTER — Ambulatory Visit (INDEPENDENT_AMBULATORY_CARE_PROVIDER_SITE_OTHER): Payer: Self-pay | Admitting: Orthopaedic Surgery

## 2020-07-19 ENCOUNTER — Telehealth: Payer: Self-pay | Admitting: Radiology

## 2020-07-19 DIAGNOSIS — M25512 Pain in left shoulder: Secondary | ICD-10-CM

## 2020-07-19 MED ORDER — METHYLPREDNISOLONE ACETATE 40 MG/ML IJ SUSP
40.0000 mg | INTRAMUSCULAR | Status: AC | PRN
  Administered 2020-07-19: 40 mg via INTRA_ARTICULAR

## 2020-07-19 MED ORDER — LIDOCAINE HCL 1 % IJ SOLN
3.0000 mL | INTRAMUSCULAR | Status: AC | PRN
  Administered 2020-07-19: 3 mL

## 2020-07-19 NOTE — Telephone Encounter (Signed)
Artis Delay would like for patient to have appointment with Dr. Junius Roads to ultrasound left wrist to R/O volar cyst. If there is one there, he requests possible aspiration. I did not know where you may want to put her on your schedule. I will be glad to call her if you give me a time that is best for you.

## 2020-07-19 NOTE — Progress Notes (Signed)
Office Visit Note   Patient: Vanessa Lara           Date of Birth: Aug 31, 1956           MRN: [id number] Visit Date: 07/19/2020              Requested by: Eunice Blase, MD [address] [place],  [place] [address] PCP: Eunice Blase, MD   Assessment & Plan: Visit Diagnoses:  1. Acute pain of left shoulder     Plan: She will return as needed for left shoulder pain.  She tolerated cortisone injection well today.  She is given pendulum, Codman forward flexion exercises and wall crawls to do on her own at home.  She also ask about left wrist swelling she states the swelling over the volar aspect of the wrist is becoming larger.  Will refer to Dr. Junius Roads for ultrasound evaluation for possible cyst however physical exam does not really correlate with cyst formation.  Follow-Up Instructions: No follow-ups on file.   Orders:  No orders of the defined types were placed in this encounter.  No orders of the defined types were placed in this encounter.     Procedures: Large Joint Inj: L subacromial bursa on 07/19/2020 4:28 PM Indications: pain Details: 22 G 1.5 in needle, lateral approach  Arthrogram: No  Medications: 3 mL lidocaine 1 %; 40 mg methylPREDNISolone acetate 40 MG/ML Outcome: tolerated well, no immediate complications Procedure, treatment alternatives, risks and benefits explained, specific risks discussed. Consent was given by the patient. Immediately prior to procedure a time out was called to verify the correct patient, procedure, equipment, support staff and site/side marked as required. Patient was prepped and draped in the usual sterile fashion.       Clinical Data: No additional findings.   Subjective: Chief Complaint  Patient presents with  . Left Shoulder - Pain    HPI  Ms. Delong comes in today for left shoulder pain that is been ongoing for the past 3 to 4 months.  No known injury.  Pain radiates down into the mid humerus.  No numbness tingling.   She states she has decreased range of motion of the shoulder particular with overhead activity.  She has difficulty sleeping due to the pain.  Does not want any radiographs of the shoulder unless absolutely necessary.  Patient has known right shoulder partial rotator cuff tear which an injection helped with.  She has no pain in the right shoulder at this point time.  Review of Systems See HPI  Objective: Vital Signs: There were no vitals taken for this visit.  Physical Exam Constitutional:      Appearance: She is not ill-appearing or diaphoretic.  Pulmonary:     Effort: Pulmonary effort is normal.  Neurological:     Mental Status: She is alert and oriented to person, place, and time.  Psychiatric:        Behavior: Behavior normal.     Ortho Exam Bilateral shoulders 5 5 strength with external/internal rotation against resistance.  Empty can test is negative bilaterally.  She has limited forward flexion and abduction to only about 90 degrees actively passively and bring her full abduction and full overhead flexion.  Impingement testing positive on the left negative on the right. Specialty Comments:  No specialty comments available.  Imaging: No results found.   PMFS History: Patient Active Problem List   Diagnosis Date Noted  . Osteoporosis 06/15/2020  . Iron deficiency anemia 04/09/2020  . Primary  osteoarthritis involving multiple joints 12/31/2018  . Rheumatoid arthritis involving multiple sites with positive rheumatoid factor ([place]) 12/31/2018  . Irritable bowel syndrome with both constipation and diarrhea 10/28/2018  . Constipation 03/14/2016  . Mitral valve prolapse 11/23/2015  . High cholesterol 11/23/2015   Past Medical History:  Diagnosis Date  . Anemia   . Arthritis   . Cancer (HCC)    Skin  . CHF (congestive heart failure) ([place])   . Collagen vascular disease ([place])   . Depression   . Endometriosis   . High cholesterol   . IBS (irritable bowel syndrome)   .  Mitral valve prolapse   . Uterus, adenomyosis     Family History  Problem Relation Age of Onset  . Colon cancer Other   . Bone cancer Mother   . Brain cancer Father   . Brain cancer Sister   . Breast cancer Sister   . Renal Disease Sister     Past Surgical History:  Procedure Laterality Date  . CARPAL TUNNEL RELEASE Right   . COLONOSCOPY    . COLONOSCOPY N/A 12/13/2015   Procedure: COLONOSCOPY;  Surgeon: Rogene Houston, MD;  Location[facility];  Service: Endoscopy;  Laterality: N/A;  9:55  . Fatty tumor     2011 (left arm)  . TONSILLECTOMY    . TOTAL ABDOMINAL HYSTERECTOMY     2003   Social History   Occupational History  . Occupation: retired  Tobacco Use  . Smoking status: Former Smoker    Packs/day: 0.50    Years: 33.00    Pack years: 16.50    Types: Cigarettes  . Smokeless tobacco: Never Used  . Tobacco comment: quit 9 yrs ago  Vaping Use  . Vaping Use: Never used  Substance and Sexual Activity  . Alcohol use: No    Alcohol/week: 0.0 standard drinks  . Drug use: No  . Sexual activity: Not Currently

## 2020-07-20 ENCOUNTER — Other Ambulatory Visit (HOSPITAL_COMMUNITY): Payer: BC Managed Care – PPO

## 2020-07-20 ENCOUNTER — Ambulatory Visit (HOSPITAL_COMMUNITY): Payer: BC Managed Care – PPO | Admitting: Hematology

## 2020-07-20 ENCOUNTER — Telehealth: Payer: Self-pay | Admitting: Family Medicine

## 2020-07-20 NOTE — Telephone Encounter (Signed)
MRI in August did not show a cyst.  Unfortunately I don't think there's anything to be drained.  I can take another look with ultrasound, but it's not likely that a cyst would have formed since August.

## 2020-07-20 NOTE — Telephone Encounter (Signed)
Patient called requesting a call back from [place]. Patient did not disclose reasoning for call. Please call patient at [phone].

## 2020-07-21 ENCOUNTER — Encounter: Payer: Self-pay | Admitting: Gastroenterology

## 2020-07-21 ENCOUNTER — Telehealth: Payer: Self-pay

## 2020-07-21 ENCOUNTER — Telehealth: Payer: Self-pay | Admitting: Gastroenterology

## 2020-07-21 NOTE — Telephone Encounter (Signed)
Could we add her on for Friday? That would give her time to make some diet changes.

## 2020-07-21 NOTE — Telephone Encounter (Signed)
Hey Dr Tarri Glenn, this pt is being referred urgently from [facility] for please refer her to [place] for colonoscopy soon. she had PET positive ileocecal lesion, Pt has seen [facility] 10/28/2018.

## 2020-07-21 NOTE — Telephone Encounter (Signed)
Spoke to Vanessa Lara and scheduled her for a Colonoscopy Fri 07-23-20 and PV tomorrow 07-22-2021 at 1:30pm.

## 2020-07-21 NOTE — Telephone Encounter (Signed)
I was asked to work Vanessa Lara in urgently for a colonoscopy. I do not perform laparoscopy. This would be a discussion to have with a Psychologist, sport and exercise. She should discuss that preference with the referring doctor or with a surgeon.  With her concerns about colonoscopy, having an office visit first would be more appropriate. To prevent delay, please schedule with the first available provider (including APPs or Vanessa Lara). Thank you.

## 2020-07-21 NOTE — Telephone Encounter (Signed)
Pt scheduled to see Carl Best NP 08/02/20 at 10am. Left message for pt cancelling the PV and colon appt. Left message regarding the new appt with NP on her voicemail.

## 2020-07-21 NOTE — Telephone Encounter (Signed)
FYI only:  The patient just wanted you to know that she is very grateful to you for helping her shoulder feel better. She has been able to sleep much better and move that arm without the terrible pain.

## 2020-07-21 NOTE — Telephone Encounter (Signed)
Called pt- Pt wishes to have VIRTUAL PV tomorrow 10-21  Instructed to stop eating nuts, seeds, popcorn, corn, beans, peas, salad, and  raw veggies   Pt states she wants to have a laparoscopy instead of a colonoscopy -  She states has a hard time with a colon-  She has issues with the prep, has IBS, after the Colon she feels like she has had a rocket shot thru her rectum and she has increased gas and feels so full-  And she prefers to NOT have a colonoscopy- She said if she has to she will, but would prefer to not   She wants to know her options besides a colonoscopy   Please advise and how to proceed ,  Vanessa Lara PV

## 2020-07-21 NOTE — Telephone Encounter (Signed)
I called the patient. She wanted to come in to have the cyst checked again. Appointment made for next Tuesday (10/26) at 11:00.

## 2020-07-21 NOTE — Telephone Encounter (Signed)
See note from Dr. Tarri Glenn regarding scheduling colon on Friday.

## 2020-07-21 NOTE — Telephone Encounter (Signed)
I called the patient: she had some questions about the GI referral (from Dr. Delton Coombes). She wanted to know if the notes from the GI doctor(s) she saw at [facility] were in her chart already or did she have to get these herself. Advised her I was able to access these through [place], in her chart.

## 2020-07-22 ENCOUNTER — Other Ambulatory Visit: Payer: Self-pay

## 2020-07-22 ENCOUNTER — Other Ambulatory Visit: Payer: Self-pay | Admitting: Gastroenterology

## 2020-07-22 DIAGNOSIS — K6389 Other specified diseases of intestine: Secondary | ICD-10-CM

## 2020-07-22 LAB — SARS CORONAVIRUS 2 (TAT 6-24 HRS): SARS Coronavirus 2: NEGATIVE

## 2020-07-23 ENCOUNTER — Encounter: Payer: Self-pay | Admitting: Gastroenterology

## 2020-07-27 ENCOUNTER — Encounter (HOSPITAL_COMMUNITY): Payer: Self-pay

## 2020-07-27 ENCOUNTER — Ambulatory Visit: Payer: Self-pay | Admitting: Family Medicine

## 2020-07-27 NOTE — Progress Notes (Signed)
Galleri testing kits are now available. I have called the patient to let her know but I was unable to reach the patient at this time. I left a message requesting that she return my phone call.

## 2020-07-28 ENCOUNTER — Other Ambulatory Visit (HOSPITAL_COMMUNITY): Payer: BC Managed Care – PPO

## 2020-07-29 ENCOUNTER — Ambulatory Visit: Payer: Self-pay | Admitting: Family Medicine

## 2020-08-02 ENCOUNTER — Other Ambulatory Visit: Payer: Self-pay | Admitting: Gastroenterology

## 2020-08-02 ENCOUNTER — Telehealth: Payer: Self-pay | Admitting: Family Medicine

## 2020-08-02 ENCOUNTER — Ambulatory Visit (INDEPENDENT_AMBULATORY_CARE_PROVIDER_SITE_OTHER): Payer: BC Managed Care – PPO | Admitting: Nurse Practitioner

## 2020-08-02 ENCOUNTER — Ambulatory Visit: Payer: Self-pay | Admitting: Nurse Practitioner

## 2020-08-02 ENCOUNTER — Encounter: Payer: Self-pay | Admitting: Nurse Practitioner

## 2020-08-02 VITALS — BP 98/60 | HR 102 | Ht 60.0 in | Wt 78.3 lb

## 2020-08-02 DIAGNOSIS — K6389 Other specified diseases of intestine: Secondary | ICD-10-CM

## 2020-08-02 DIAGNOSIS — R948 Abnormal results of function studies of other organs and systems: Secondary | ICD-10-CM

## 2020-08-02 DIAGNOSIS — Z1159 Encounter for screening for other viral diseases: Secondary | ICD-10-CM | POA: Diagnosis not present

## 2020-08-02 LAB — SARS CORONAVIRUS 2 (TAT 6-24 HRS): SARS Coronavirus 2: NEGATIVE

## 2020-08-02 MED ORDER — PLENVU 140 G PO SOLR: 1.0000 | ORAL | 0 refills | Status: DC

## 2020-08-02 NOTE — Patient Instructions (Signed)
If you are age 64 or older, your body mass index should be between 23-30. Your Body mass index is 15.29 kg/m. If this is out of the aforementioned range listed, please consider follow up with your Primary Care Provider.  If you are age 11 or younger, your body mass index should be between 19-25. Your Body mass index is 15.29 kg/m. If this is out of the aformentioned range listed, please consider follow up with your Primary Care Provider.   You have been scheduled for a colonoscopy. Please follow written instructions given to you at your visit today.  Please pick up your prep supplies at the pharmacy within the next 1-3 days. If you use inhalers (even only as needed), please bring them with you on the day of your procedure.  Further follow up will be determined after your colonoscopy.

## 2020-08-02 NOTE — Telephone Encounter (Signed)
Patient called requesting a call back from [place]. Patient states she has questions. Please call patient at [phone].

## 2020-08-02 NOTE — Progress Notes (Signed)
08/02/2020 Vanessa Lara [id number] 1956-01-29    Chief Complaint: Schedule a colonoscopy    History of Present Illness: Vanessa Lara is a 64 year old female with a past medical history of depression, rheumatoid arthritis followed by rheumatologist at [facility] and Dr. Kathlene November in [place], [place] secondary to [place], osteoarthritis, bursitis, tendonitis and questionable collagen vascular disorder, lumbar spondylosis, chronic iron deficiency anemia received IV iron 04/2019 and 06/14/2020 and IBS symptoms. S/P total hysterectomy 2003.  She presents to our office today as referred by Dr. Delton Coombes for further evaluation regarding an abnormal PET scan which showed an abnormality at the TI. She reported losing 20 -30 lbs over the past 9 to 10 months. She underwent a chest/abd/pelvic CT scan 06/23/2020 without evidence of acute findings or neoplasm to the chest, abdomen or pelvis. She underwent a PET scan 07/09/2020 which showed metabolic activity at the TI/IC valve area, a 79mm right lower lung nodule and a 1.1 cm right axillary lymph node. See full PET scan report below.  A GI consult for consideration for a colonoscopy was recommended. She complains of having chronic lower abdominal pain which she attribute to having IBS. She is passing a fairly normal brown formed stool at least every other day. Sometimes has loose stools. No watery diarrhea. No rectal bleeding or black stools. Infrequent heartburn. No dysphagia. She takes Meloxicam for arthritis pain for several years. Decreased appetite. No fever, sweats or chills. She underwent a colonoscopy by Dr. Laural Golden 12/13/2015 which showed external hemorrhoids otherwise was normal. She reported having difficulty completing the colonoscopy bowel prep which resulted in feeling unwell with significant abdominal cramping. No family history of colon cancer.   CBC Latest Ref Rng & Units 06/28/2020 03/19/2020 05/15/2019  WBC 4.0 - 10.5 K/uL 15.2(H) 13.1(H) 11.8(H)   Hemoglobin 12.0 - 15.0 g/dL 14.0 11.4(L) 12.4  Hematocrit 36 - 46 % 43.4 33.7(L) 37.1  Platelets 150 - 400 K/uL 418(H) 431(H) 345    Iron/TIBC/Ferritin/ %Sat    Component Value Date/Time   IRON 84 06/28/2020 1229   TIBC 287 06/28/2020 1229   FERRITIN 428 (H) 06/28/2020 1229   IRONPCTSAT 29 06/28/2020 1229   IRONPCTSAT 10 (L) 03/19/2020 1707    CMP Latest Ref Rng & Units 04/20/2020 03/19/2020 11/06/2019  Glucose 65 - 99 mg/dL 87 87 121(H)  BUN 7 - 25 mg/dL - 11 10  Creatinine 0.50 - 0.99 mg/dL - 0.73 0.79  Sodium 135 - 146 mmol/L - 140 141  Potassium 3.5 - 5.3 mmol/L - 4.0 3.7  Chloride 98 - 110 mmol/L - 102 101  CO2 20 - 32 mmol/L - 27 25  Calcium 8.6 - 10.4 mg/dL - 9.5 9.4  Total Protein 6.1 - 8.1 g/dL - 7.3 -  Total Bilirubin 0.2 - 1.2 mg/dL - 0.3 -  AST 10 - 35 U/L - 18 -  ALT 6 - 29 U/L - 8 -   PET scan 07/09/2020: 1. Mildly hypermetabolic right axillary lymph node with maximum SUV of 4.6. Borderline accentuated metabolic activity in small right axillary and subpectoral lymph nodes. Significance of this hypermetabolic activity is uncertain; no hypermetabolic breast mass is identified. Conceivably a low-grade lymphoma could cause some localized adenopathy, the appearance is not considered specific. 2. Accentuated activity along the distal most terminal ileum and ileocecal valve. This may well be physiologic given that I not see a mass or specific abnormality in this region on the recent diagnostic CT of 06/23/2020, but the activity is  focal enough to raise concern. Correlate with history of colon cancer screening in determining whether further workup such as colonoscopy is warranted. 3. Nonspecific 4 mm sub solid nodule in the right lower lobe without appreciable hypermetabolic activity, but below sensitive PET-CT size thresholds. No follow-up needed if patient is low-risk. Non-contrast chest CT can be considered in 12 months if patient is high-risk. This recommendation  follows the consensus statement: Guidelines for Management of Incidental Pulmonary Nodules Detected on CT Images: From the Fleischner Society 2017; Radiology 2017; 284:228-243. 4. Other imaging findings of potential clinical significance: Aortic Atherosclerosis (ICD10-I70.0). Prominent stool throughout the colon favors constipation. Arthropathy related activity in the wrists and hands. Chronic left maxillary sinusitis. Emphysema (ICD10-J43.9).   Abdominal/Pelvic CT scan with contrast 06/23/2020: No evidence of neoplasm or other acute findings within the chest, abdomen, or pelvis. Right sacral insufficiency fracture incidentally noted. Aortic Atherosclerosis (ICD10-I70.0) and Emphysema    ECHO 10/15/2017: - Left ventricle: The cavity size was normal. Wall thickness was  normal. Systolic function was normal. The estimated ejection  fraction was in the range of 55% to 60%. Diastolic dysfunction,  grade indeterminate. Normal filling pressures.  - Regional wall motion abnormality: Mild hypokinesis of the apical  anterior, apical inferior, apical lateral, and apical myocardium.  - Mitral valve: No significant mitral valve prolapse.    Past Medical History:  Diagnosis Date  . Anemia   . Arthritis   . Cancer (HCC)    Skin  . CHF (congestive heart failure) ([place])   . Collagen vascular disease ([place])   . Depression   . Endometriosis   . High cholesterol   . IBS (irritable bowel syndrome)   . Mitral valve prolapse   . Uterus, adenomyosis     Past Surgical History:  Procedure Laterality Date  . CARPAL TUNNEL RELEASE Right   . COLONOSCOPY    . COLONOSCOPY N/A 12/13/2015   Procedure: COLONOSCOPY;  Surgeon: Rogene Houston, MD;  Location[facility];  Service: Endoscopy;  Laterality: N/A;  9:55  . Fatty tumor     2011 (left arm)  . TONSILLECTOMY    . TOTAL ABDOMINAL HYSTERECTOMY     2003    Social History: Single. Retired. She smoked cigarettes 1/2 ppd x 20 years quid 8  or 9 years ago. Rare alcohol intake.   Family History: Mother deceased age 92 bone cancer. Father deceased age 17 brain tumor. Sister deceased ESRD. Sister deceased cancer with metastasis, possible primary brain cancer. No family history of esophageal, gastric or colon cancer.   Review of systems: Gen: + 20 - 30lb wt loss, see HPI. CV: Denies chest pain, palpitations or edema. Resp: Denies cough, shortness of breath of hemoptysis.  GI: See HPI.  GU : Denies urinary burning, blood in urine, increased urinary frequency or incontinence. MS: Generalized aches, left shoulder pain, wrists, knees, back. Derm: Denies rash, itchiness, skin lesions or unhealing ulcers. Psych: Denies depression, anxiety, memory loss, suicidal ideation and confusion. Heme: Denies bruising, bleeding. Neuro:  Denies headaches, dizziness or paresthesias. Endo:  Denies any problems with DM, thyroid or adrenal function.    Current Outpatient Medications on File Prior to Visit  Medication Sig Dispense Refill  . atorvastatin (LIPITOR) 10 MG tablet Take 1 tablet (10 mg total) by mouth daily. (Patient taking differently: Take 5 mg by mouth daily. ) 90 tablet 1  . chlorzoxazone (PARAFON FORTE) 250 MG tablet Take 1 tablet (250 mg total) by mouth 4 (four) times daily as needed  for muscle spasms. 60 tablet 3  . meloxicam (MOBIC) 15 MG tablet TAKE 1/2 TO 1 TABLET(7.5 TO 15 MG) BY MOUTH DAILY AS NEEDED FOR PAIN 30 tablet 6  . PARoxetine (PAXIL) 10 MG tablet Take 1 tablet (10 mg total) by mouth daily. 90 tablet 1  . traZODone (DESYREL) 50 MG tablet Take 1-2 tablets (50-100 mg total) by mouth at bedtime as needed for sleep. (Patient taking differently: Take 25-50 mg by mouth at bedtime as needed for sleep. ) 60 tablet 6  . diphenoxylate-atropine (LOMOTIL) 2.5-0.025 MG tablet Take 1-2 tablets by mouth 4 (four) times daily as needed for diarrhea or loose stools. (Patient not taking: Reported on 07/12/2020) 30 tablet 0  . furosemide  (LASIX) 20 MG tablet Take 20 mg by mouth daily as needed. (Patient not taking: Reported on 07/12/2020)    . gabapentin (NEURONTIN) 100 MG capsule Take 100 mg by mouth 3 (three) times daily. Pt takes as needed (Patient not taking: Reported on 07/12/2020)    . LORazepam (ATIVAN) 0.5 MG tablet TAKE 1/2 TO 1 TABLET(0.25 TO 0.5 MG) BY MOUTH THREE TIMES DAILY AS NEEDED FOR ANXIETY (Patient not taking: Reported on 07/12/2020) 30 tablet 1   No current facility-administered medications on file prior to visit.   Allergies  Allergen Reactions  . Codeine Other (See Comments)    Stomach pains/constipation- due to IBS  . Hydrocodone Other (See Comments)    She does not want to take this due to intolerance to codeine.  . Methotrexate Derivatives Other (See Comments)    Agitation; pt stated, "I get no sleep; one dose caused no sleep for 7 days and 7 nights - that was when I got a 0.5 injection"     Current Medications, Allergies, Past Medical History, Past Surgical History, Family History and Social History were reviewed in [employer] record.   Review of Systems:   Constitutional: Negative for fever, sweats, chills or weight loss.  Respiratory: Negative for shortness of breath.   Cardiovascular: Negative for chest pain, palpitations and leg swelling.  Gastrointestinal: See HPI.  Musculoskeletal: Negative for back pain or muscle aches.  Neurological: Negative for dizziness, headaches or paresthesias.    Physical Exam: There were no vitals taken for this visit.  BP 98/60   Pulse (!) 102   Ht 5' (1.524 m)   Wt 78 lb 5 oz (35.5 kg)   SpO2 97%   BMI 15.29 kg/m   General: 64 year old cachectic female in no acute distress. Head: Normocephalic and atraumatic. Eyes: No scleral icterus. Conjunctiva pink . Ears: Normal auditory acuity. Mouth: Dentition intact. No ulcers or lesions.  Lungs: Clear throughout to auscultation. Heart: Regular rate and rhythm, no murmur. Abdomen:  Soft,lack of adipose. Nontender and nondistended. No masses or hepatomegaly. Normal bowel sounds x 4 quadrants.  Rectal: Deferred.  Musculoskeletal: Symmetrical with no gross deformities. Extremities: No edema. Neurological: Alert oriented x 4. No focal deficits.  Psychological: Alert and cooperative. Anxious.   Assessment and Recommendations:  64. 64 year old female with 20 -30 lb unexplained weight loss. PET scan showed metabolic uptake at the TI/IC valve area without a discrete mass on CTAP. -Colonoscopy benefits and risks discussed including risk with sedation, risk of bleeding, perforation and infection  -PATIENT REQUESTS IV FLUIDS TO BE GIVEN AT TIME OF HER COLONOSCOPY TO REDUCE RISK OF DEHYDRATION AND ABDOMINAL CRAMPING.  -Further recommendations to be determined after colonoscopy completed.   2. IDA followed by hematologist Dr.  Katragadda. Received intermittent Feraheme 04/22/2020 and 06/14/2020 -Dr. Tarri Glenn to verify if an EGD to be done at time of colonoscopy.  3. PET scan showed a pulmonary nodule and a 1.1 cm right axillary lymph node. Referred to pulmonology. Evaluated by Dr. Delton Coombes and referred  to the cancer center in [place]  4. History of IBS

## 2020-08-02 NOTE — Telephone Encounter (Signed)
I called the patient. Scheduled an appointment with Dr. Junius Roads this Friday, for the repeat ultrasound of the left wrist. She said she saw a doctor today that mentioned a new medication for RA. She is interested in trying this. However, she is not at home an cannot remember the name. The patient will either call me back with this info, or bring it on Friday to her ov.

## 2020-08-03 ENCOUNTER — Telehealth: Payer: Self-pay | Admitting: Family Medicine

## 2020-08-03 ENCOUNTER — Telehealth: Payer: Self-pay | Admitting: Nurse Practitioner

## 2020-08-03 MED ORDER — DULOXETINE HCL 20 MG PO CPEP: 20.0000 mg | ORAL_CAPSULE | Freq: Every day | ORAL | 6 refills | Status: DC

## 2020-08-03 NOTE — Telephone Encounter (Signed)
Patient's pharmacy has been contacted, they have received her Plenvu in. The patient has been contacted and advised that it is as her pharmacy ready for her to pick up. I have also faxed a Plenvu card to the pharmacy to help her cover the cost of of the prep. I have went over with her that she must do both the prep both this afternoon and tomorrow. Patient is very unhappy that she has to do a prep and that she must do it both this afternoon and tomorrow.

## 2020-08-03 NOTE — Telephone Encounter (Signed)
I called and advised the patient --- she said she will wait until the end of the week to start the new medication, after she is finished with the colonoscopy tomorrow.

## 2020-08-03 NOTE — Telephone Encounter (Signed)
Patient called wanting to let Dr Junius Roads and Karna Christmas know that Dr Caryn Section at the cancer center in [place] said the drug Cynealta/Duloxetina may decrease her RA and Osteoporosis pain. The number to contact patient is [phone]

## 2020-08-03 NOTE — Telephone Encounter (Signed)
The patient would like to try this medication to see if it helps her symptoms. She does have an appointment here on Friday for her wrist, if it is better to discuss it with her first.

## 2020-08-03 NOTE — Telephone Encounter (Signed)
Cymbalta Rx sent.  Needs to stop paxil since the combination could cause side effects.

## 2020-08-03 NOTE — Telephone Encounter (Signed)
Patient called states the pharmacy does not have the prep medication also states she ate something at about 2am would like to know if that is ok  She is also requesting to update her DPR\HIPPA

## 2020-08-04 ENCOUNTER — Ambulatory Visit: Payer: Self-pay | Admitting: Gastroenterology

## 2020-08-04 ENCOUNTER — Ambulatory Visit: Payer: Self-pay | Admitting: Family Medicine

## 2020-08-04 ENCOUNTER — Other Ambulatory Visit: Payer: Self-pay

## 2020-08-04 ENCOUNTER — Telehealth: Payer: Self-pay | Admitting: Internal Medicine

## 2020-08-04 ENCOUNTER — Encounter: Payer: Self-pay | Admitting: Gastroenterology

## 2020-08-04 VITALS — BP 111/65 | HR 89 | Temp 97.1°F | Ht 60.0 in | Wt 78.0 lb

## 2020-08-04 DIAGNOSIS — R948 Abnormal results of function studies of other organs and systems: Secondary | ICD-10-CM

## 2020-08-04 MED ORDER — SODIUM CHLORIDE 0.9 % IV SOLN: 500.0000 mL | Freq: Once | INTRAVENOUS | Status: DC

## 2020-08-04 NOTE — Progress Notes (Signed)
Reviewed and agree with management plans. Will proceed with colonoscopy later this week.  Alaine Loughney L. Tarri Glenn, MD, MPH

## 2020-08-04 NOTE — Progress Notes (Signed)
Vitals-NS  Patient getting very angry regarding my questions. She is angry regarding her IV as well. Patient is asking about the gauge of needle.  Patient stated that she threw up the first part of her prep.  States that she didn't have any BM's today. States that her last BM was soft mushy brown.  [facility] notifed, and will send Dr. Tarri Glenn in as soon as she gets here. Dr Tarri Glenn will do  the procedure. Helped patient up to bathroom. Refused HELP.

## 2020-08-04 NOTE — Telephone Encounter (Signed)
Patient had colonoscopy cancelled today and rescheduled for 0800 tomorrow w/ dr. Renford Dills she is too bloated and weak to do any more prep  Apologetic  Says she will do the procedure in the future but needs to stop prepping and retry different day  Encouraged her to take a break and retry but she declined  Told her we would call back with a new plan

## 2020-08-04 NOTE — Progress Notes (Addendum)
Pt will be rescheduled for tomorrow morning with Dr. Hilarie Fredrickson for 8:00 am procedure.  Will plan to instruct her for additional prep tonight and tomorrow morning.  Discussed with pt and she will try to take sutab.  She thinks she will be able to take pills better than liquid.  She understands instructions and importance of completing prep.  Pt given 1000cc NS per IV prior to discharge at 1530pm.

## 2020-08-05 ENCOUNTER — Telehealth: Payer: Self-pay | Admitting: Family Medicine

## 2020-08-05 ENCOUNTER — Telehealth: Payer: Self-pay

## 2020-08-05 ENCOUNTER — Encounter: Payer: Self-pay | Admitting: Internal Medicine

## 2020-08-05 NOTE — Telephone Encounter (Signed)
Spoke with pt to reschedule colon with a 2 day prep. Offered pt next available of 12/9, she states that is not going to work. States she really wants to stay with Dr. Tarri Glenn but wants procedure sooner. OK to schedule with another MD? Please advise.

## 2020-08-05 NOTE — Telephone Encounter (Signed)
Please reschedule with a two day prep. Thank you.

## 2020-08-05 NOTE — Telephone Encounter (Signed)
May schedule with me or any other doctor at any time. Given concerns for a cecal mass, we shouldn't delay her evaluation. Thank you.

## 2020-08-05 NOTE — Telephone Encounter (Signed)
Pt scheduled for previsit 08/09/20@10 :30am, pt needs 2 day prep per Dr. Tarri Glenn. Colon scheduled with Dr. Silverio Decamp 08/12/20@8am . Pt aware of appts.

## 2020-08-05 NOTE — Telephone Encounter (Signed)
I called the patient. She advised me of the issues she had with trying to have the colonoscopy done. Her stomach and rectum "just seized up" due to the stress and her IBS. She is quite frustrated with this whole process - she wants the test done, but cannot tolerate the prep. I did see in the chart that the GI office is trying to find a new date for her, after doing a 2-day prep. I offered reassurance that the GI office will be in touch, and advised that she ask them any/all questions she has about the colonoscopy and new prep, as it is not something I can help her with. The patient did calm down by the end of the phone call. Encouraged her to try to not stress herself out ruminating over what the colonoscopy may show. We will see her tomorrow at her appointment for her wrist cyst.

## 2020-08-05 NOTE — Telephone Encounter (Signed)
The patient would like a call back about her insurance - if it has "kicked back in again yet." She has an appointment with Dr. Junius Roads tomorrow and would like to know before then.

## 2020-08-05 NOTE — Telephone Encounter (Signed)
Dr. Silverio Decamp are you ok doing colon on this lady for Dr. Tarri Glenn? She has a cecal mass and needs colon sooner than Dr. Tarri Glenn has available. Please advise. I have tentatively scheduled her with you on 08/12/20 at 8am if you approve. Please advise.

## 2020-08-05 NOTE — Telephone Encounter (Signed)
Ok, no problem!

## 2020-08-05 NOTE — Telephone Encounter (Signed)
Thank you :)

## 2020-08-05 NOTE — Telephone Encounter (Signed)
Patient called and asked Terri to call her as soon as possible. Please call patient at [phone].

## 2020-08-06 ENCOUNTER — Ambulatory Visit: Payer: Self-pay

## 2020-08-06 ENCOUNTER — Other Ambulatory Visit: Payer: Self-pay

## 2020-08-06 ENCOUNTER — Encounter: Payer: Self-pay | Admitting: Family Medicine

## 2020-08-06 ENCOUNTER — Ambulatory Visit (INDEPENDENT_AMBULATORY_CARE_PROVIDER_SITE_OTHER): Payer: BC Managed Care – PPO | Admitting: Family Medicine

## 2020-08-06 VITALS — BP 116/75 | HR 98 | Ht 60.0 in | Wt 75.6 lb

## 2020-08-06 DIAGNOSIS — R634 Abnormal weight loss: Secondary | ICD-10-CM

## 2020-08-06 DIAGNOSIS — R911 Solitary pulmonary nodule: Secondary | ICD-10-CM

## 2020-08-06 DIAGNOSIS — M25532 Pain in left wrist: Secondary | ICD-10-CM

## 2020-08-06 DIAGNOSIS — E611 Iron deficiency: Secondary | ICD-10-CM

## 2020-08-06 NOTE — Progress Notes (Signed)
Office Visit Note   Patient: Vanessa Lara           Date of Birth: 1956-08-13           MRN: [id number] Visit Date: 08/06/2020 Requested by: Eunice Blase, MD [address] [place],  [place] [address] PCP: Eunice Blase, MD  Subjective: Chief Complaint  Patient presents with  . Left Wrist - Follow-up    Recheck cyst on wrist per request of Benita Stabile, PA.    HPI: She is here for evaluation of her left wrist.  She has volar prominence of the soft tissues and wanted to be sure she did not have a cyst.  She would also like her iron levels checked to see if she needs another infusion.               ROS:   All other systems were reviewed and are negative.  Objective: Vital Signs: Ht 5' (1.524 m)   Wt 75 lb 9.6 oz (34.3 kg)   BMI 14.76 kg/m   Physical Exam:  General:  Alert and oriented, in no acute distress. Pulm:  Breathing unlabored. Psy:  Normal mood, congruent affect    Imaging: US Guided Needle Placement - No Linked Charges  Result Date: 08/06/2020 Limited diagnostic ultrasound of the left volar wrist reveals no evidence of ganglion cyst.  No hyperemia on power Doppler imaging.  No abnormality seen.   Assessment & Plan: 1.  Left volar wrist prominence, etiology uncertain.  No sign of cyst today. -Reassurance, no intervention needed.  2.  History of iron deficiency -Labs today.  She sees her oncologist next week and could get another infusion if needed.     Procedures: No procedures performed  No notes on file     PMFS History: Patient Active Problem List   Diagnosis Date Noted  . Osteoporosis 06/15/2020  . Iron deficiency anemia 04/09/2020  . Primary osteoarthritis involving multiple joints 12/31/2018  . Rheumatoid arthritis involving multiple sites with positive rheumatoid factor ([facility]) 12/31/2018  . Irritable bowel syndrome with both constipation and diarrhea 10/28/2018  . Constipation 03/14/2016  . Mitral valve prolapse 11/23/2015  . High  cholesterol 11/23/2015   Past Medical History:  Diagnosis Date  . Anemia   . Arthritis   . Cancer (HCC)    Skin  . CHF (congestive heart failure) ([place])   . Collagen vascular disease ([facility])   . Depression   . Endometriosis   . High cholesterol   . IBS (irritable bowel syndrome)   . Mitral valve prolapse   . Uterus, adenomyosis     Family History  Problem Relation Age of Onset  . Colon cancer Other   . Bone cancer Mother   . Brain cancer Father   . Brain cancer Sister   . Breast cancer Sister   . Renal Disease Sister   . Pancreatic cancer Neg Hx   . Esophageal cancer Neg Hx     Past Surgical History:  Procedure Laterality Date  . CARPAL TUNNEL RELEASE Right   . COLONOSCOPY    . COLONOSCOPY N/A 12/13/2015   Procedure: COLONOSCOPY;  Surgeon: Rogene Houston, MD;  Location: [facility];  Service: Endoscopy;  Laterality: N/A;  9:55  . Fatty tumor     2011 (left arm)  . TONSILLECTOMY    . TOTAL ABDOMINAL HYSTERECTOMY     2003   Social History   Occupational History  . Occupation: retired  Tobacco Use  . Smoking  status: Former Smoker    Packs/day: 0.50    Years: 33.00    Pack years: 16.50    Types: Cigarettes  . Smokeless tobacco: Never Used  . Tobacco comment: quit 9 yrs ago  Vaping Use  . Vaping Use: Never used  Substance and Sexual Activity  . Alcohol use: No    Alcohol/week: 0.0 standard drinks  . Drug use: No  . Sexual activity: Not Currently

## 2020-08-07 LAB — IRON,TIBC AND FERRITIN PANEL
%SAT: 21 % (calc) (ref 16–45)
Ferritin: 229 ng/mL (ref 16–288)
Iron: 52 ug/dL (ref 45–160)
TIBC: 245 mcg/dL (calc) — ABNORMAL LOW (ref 250–450)

## 2020-08-09 ENCOUNTER — Telehealth: Payer: Self-pay | Admitting: Family Medicine

## 2020-08-09 ENCOUNTER — Other Ambulatory Visit: Payer: Self-pay | Admitting: Gastroenterology

## 2020-08-09 ENCOUNTER — Other Ambulatory Visit: Payer: Self-pay

## 2020-08-09 ENCOUNTER — Ambulatory Visit (AMBULATORY_SURGERY_CENTER): Payer: Self-pay | Admitting: *Deleted

## 2020-08-09 VITALS — Ht 60.0 in | Wt 75.0 lb

## 2020-08-09 DIAGNOSIS — K6389 Other specified diseases of intestine: Secondary | ICD-10-CM

## 2020-08-09 DIAGNOSIS — R2230 Localized swelling, mass and lump, unspecified upper limb: Secondary | ICD-10-CM

## 2020-08-09 DIAGNOSIS — R911 Solitary pulmonary nodule: Secondary | ICD-10-CM

## 2020-08-09 DIAGNOSIS — R948 Abnormal results of function studies of other organs and systems: Secondary | ICD-10-CM

## 2020-08-09 DIAGNOSIS — Z1159 Encounter for screening for other viral diseases: Secondary | ICD-10-CM | POA: Diagnosis not present

## 2020-08-09 LAB — SARS CORONAVIRUS 2 (TAT 6-24 HRS): SARS Coronavirus 2: NEGATIVE

## 2020-08-09 MED ORDER — CLENPIQ 10-3.5-12 MG-GM -GM/160ML PO SOLN: 1.0000 | ORAL | 0 refills | Status: DC

## 2020-08-09 NOTE — Telephone Encounter (Signed)
I called and advised the patient of these results.   She would like to be referred to a thoracic surgeon, if possible, to check out the lung nodule and even the lymph node in the right axilla -- she is requesting this instead of a 2nd opinion oncology visit.   Please advise.

## 2020-08-09 NOTE — Telephone Encounter (Signed)
I called the patient - no answer and the mailbox was full. Will try again later.

## 2020-08-09 NOTE — Progress Notes (Signed)
No egg or soy allergy known to patient  No issues with past sedation with any surgeries or procedures no intubation problems in the past  No FH of Malignant Hyperthermia No diet pills per patient No home 02 use per patient  No blood thinners per patient  Pt denies issues with constipation  No A fib or A flutter  EMMI video to pt or via [place] 19 guidelines implemented in PV today with Pt and RN   Pt states her bowels lock up by her IBS- wants that documented   Pt wants extra IVF Thursday with her colon-   Clenpiq sample- Lot [id number] exp 05/2021  Due to the COVID-19 pandemic we are asking patients to follow these guidelines. Please only bring one care partner. Please be aware that your care partner may wait in the car in the parking lot or if they feel like they will be too hot to wait in the car, they may wait in the lobby on the 4th floor. All care partners are required to wear a mask the entire time (we do not have any that we can provide them), they need to practice social distancing, and we will do a Covid check for all patient's and care partners when you arrive. Also we will check their temperature and your temperature. If the care partner waits in their car they need to stay in the parking lot the entire time and we will call them on their cell phone when the patient is ready for discharge so they can bring the car to the front of the building. Also all patient's will need to wear a mask into building.

## 2020-08-09 NOTE — Addendum Note (Signed)
Addended by: Hortencia Pilar on: 08/09/2020 04:39 PM   Modules accepted: Orders

## 2020-08-09 NOTE — Telephone Encounter (Signed)
Orders placed.

## 2020-08-09 NOTE — Telephone Encounter (Signed)
Iron levels look good, no need for infusion right now.

## 2020-08-11 ENCOUNTER — Inpatient Hospital Stay (HOSPITAL_COMMUNITY): Payer: BC Managed Care – PPO | Attending: Hematology | Admitting: Hematology

## 2020-08-11 DIAGNOSIS — D649 Anemia, unspecified: Secondary | ICD-10-CM | POA: Diagnosis not present

## 2020-08-11 NOTE — Progress Notes (Signed)
Virtual Visit via Telephone Note  I connected with Vanessa Lara on 08/11/20 at  3:30 PM EST by telephone and verified that I am speaking with the correct person using two identifiers.  Location: Patient: At home Provider: In the office   I discussed the limitations, risks, security and privacy concerns of performing an evaluation and management service by telephone and the availability of in person appointments. I also discussed with the patient that there may be a patient responsible charge related to this service. The patient expressed understanding and agreed to proceed.   History of Present Illness: She is seen in our clinic for normocytic anemia, weight loss, JAK2 negative leukocytosis.  She had a PET scan done on 07/09/2020 which showed hypermetabolic right axillary lymph node and activity in the terminal ileum.   Observations/Objective: She is reportedly drinking colonoscopy prep and feels very weak.  She is having diarrhea.  She has colonoscopy scheduled for tomorrow.  She reports good appetite.  She reports her weight to be around 75.6 pounds today which is about 1 and half pounds less than what she was on 07/12/2020.  Assessment and Plan:  1.  PET positive right axillary lymph node: -She has completed 5 days of azithromycin as she had a history of cat bite. -We will reevaluate her lymph node when she comes back in 2 weeks.  2.  PET positive ileocecal lesion: -She is having colonoscopy on 08/12/2020. -We will follow up on the report and pathology if biopsies done.  3.  Weight loss: -She has restricted diet because of IBS and cannot tolerate any fatty foods. -She lost about 1 and half pounds from last visit on 07/12/2020 but she is drinking colonoscopy prep. -We will reassess her weight in 2 weeks. -She has a lot of family history of malignancies.  We have recommended Galleri testing which we will discuss at next visit.   Follow Up Instructions:   RTC 2 weeks. I  discussed the assessment and treatment plan with the patient. The patient was provided an opportunity to ask questions and all were answered. The patient agreed with the plan and demonstrated an understanding of the instructions.   The patient was advised to call back or seek an in-person evaluation if the symptoms worsen or if the condition fails to improve as anticipated.  I provided 7 minutes of non-face-to-face time during this encounter.   Derek Jack, MD

## 2020-08-12 ENCOUNTER — Ambulatory Visit (AMBULATORY_SURGERY_CENTER): Payer: BC Managed Care – PPO | Admitting: Gastroenterology

## 2020-08-12 ENCOUNTER — Encounter: Payer: Self-pay | Admitting: Gastroenterology

## 2020-08-12 ENCOUNTER — Other Ambulatory Visit: Payer: Self-pay

## 2020-08-12 VITALS — BP 94/65 | HR 86 | Temp 97.3°F | Resp 28 | Ht 60.0 in | Wt 75.0 lb

## 2020-08-12 DIAGNOSIS — K648 Other hemorrhoids: Secondary | ICD-10-CM

## 2020-08-12 DIAGNOSIS — R933 Abnormal findings on diagnostic imaging of other parts of digestive tract: Secondary | ICD-10-CM | POA: Diagnosis not present

## 2020-08-12 DIAGNOSIS — K5730 Diverticulosis of large intestine without perforation or abscess without bleeding: Secondary | ICD-10-CM | POA: Diagnosis not present

## 2020-08-12 DIAGNOSIS — R948 Abnormal results of function studies of other organs and systems: Secondary | ICD-10-CM

## 2020-08-12 MED ORDER — SODIUM CHLORIDE 0.9 % IV SOLN: 500.0000 mL | Freq: Once | INTRAVENOUS | Status: DC

## 2020-08-12 NOTE — Patient Instructions (Addendum)
Handouts on diverticulosis & hemorrhoids given to you today. Repeat Colonoscopy in 10 years    YOU HAD AN ENDOSCOPIC PROCEDURE TODAY AT [facility]:   Refer to the procedure report that was given to you for any specific questions about what was found during the examination.  If the procedure report does not answer your questions, please call your gastroenterologist to clarify.  If you requested that your care partner not be given the details of your procedure findings, then the procedure report has been included in a sealed envelope for you to review at your convenience later.  YOU SHOULD EXPECT: Some feelings of bloating in the abdomen. Passage of more gas than usual.  Walking can help get rid of the air that was put into your GI tract during the procedure and reduce the bloating. If you had a lower endoscopy (such as a colonoscopy or flexible sigmoidoscopy) you may notice spotting of blood in your stool or on the toilet paper. If you underwent a bowel prep for your procedure, you may not have a normal bowel movement for a few days.  Please Note:  You might notice some irritation and congestion in your nose or some drainage.  This is from the oxygen used during your procedure.  There is no need for concern and it should clear up in a day or so.  SYMPTOMS TO REPORT IMMEDIATELY:   Following lower endoscopy (colonoscopy or flexible sigmoidoscopy):  Excessive amounts of blood in the stool  Significant tenderness or worsening of abdominal pains  Swelling of the abdomen that is new, acute  Fever of 100F or higher    For urgent or emergent issues, a gastroenterologist can be reached at any hour by calling [phone]. Do not use MyChart messaging for urgent concerns.    DIET:  We do recommend a small meal at first, but then you may proceed to your regular diet.  Drink plenty of fluids but you should avoid alcoholic beverages for 24 hours.  ACTIVITY:  You should plan to  take it easy for the rest of today and you should NOT DRIVE or use heavy machinery until tomorrow (because of the sedation medicines used during the test).    FOLLOW UP: Our staff will call the number listed on your records 48-72 hours following your procedure to check on you and address any questions or concerns that you may have regarding the information given to you following your procedure. If we do not reach you, we will leave a message.  We will attempt to reach you two times.  During this call, we will ask if you have developed any symptoms of COVID 19. If you develop any symptoms (ie: fever, flu-like symptoms, shortness of breath, cough etc.) before then, please call [phone].  If you test positive for Covid 19 in the 2 weeks post procedure, please call and report this information to [place].    If any biopsies were taken you will be contacted by phone or by letter within the next 1-3 weeks.  Please call us at [phone] if you have not heard about the biopsies in 3 weeks.    SIGNATURES/CONFIDENTIALITY: You and/or your care partner have signed paperwork which will be entered into your electronic medical record.  These signatures attest to the fact that that the information above on your After Visit Summary has been reviewed and is understood.  Full responsibility of the confidentiality of this discharge information lies with you and/or your  care-partner.

## 2020-08-12 NOTE — Progress Notes (Signed)
VS- Vanessa Lara  Pt's states no medical or surgical changes since previsit or office visit.  Pt is requesting "extra fluids"

## 2020-08-12 NOTE — Progress Notes (Signed)
Report to PACU, RN, vss, BBS= Clear.  

## 2020-08-12 NOTE — Op Note (Addendum)
[facility] Patient Name: Vanessa Lara Procedure Date: 08/12/2020 8:09 AM MRN: [id number] Endoscopist: Mauri Pole , MD Age: 64 Referring MD:  Date of Birth: 16-Aug-1956 Gender: Female Account #: [id number] Procedure:                Colonoscopy Indications:              Abnormal PET scan of the GI tract Medicines:                Monitored Anesthesia Care Procedure:                Pre-Anesthesia Assessment:                           - Prior to the procedure, a History and Physical                            was performed, and patient medications and                            allergies were reviewed. The patient's tolerance of                            previous anesthesia was also reviewed. The risks                            and benefits of the procedure and the sedation                            options and risks were discussed with the patient.                            All questions were answered, and informed consent                            was obtained. ASA Grade Assessment: III - A patient                            with severe systemic disease. After reviewing the                            risks and benefits, the patient was deemed in                            satisfactory condition to undergo the procedure.                           After obtaining informed consent, the colonoscope                            was passed under direct vision. Throughout the                            procedure, the patient's blood pressure, pulse, and  oxygen saturations were monitored continuously. The                            Colonoscope was introduced through the anus and                            advanced to the the cecum, identified by                            appendiceal orifice and ileocecal valve. The                            patient tolerated the procedure well. The quality                            of the bowel preparation  was excellent. The                            ileocecal valve, appendiceal orifice, and rectum                            were photographed. The colonoscopy was technically                            difficult and complex due to restricted mobility of                            the colon, a redundant colon, significant looping                            and a tortuous colon. Successful completion of the                            procedure was aided by changing the patient to a                            supine position. Scope In: 8:18:03 AM Scope Out: 8:40:18 AM Scope Withdrawal Time: 0 hours 14 minutes 3 seconds  Total Procedure Duration: 0 hours 22 minutes 15 seconds  Findings:                 The perianal and digital rectal examinations were                            normal.                           The cecum and IC valvle appeared normal. Terminal                            ileum could not be intubated due to restricted                            sigmoid and redundant colon.  Multiple small-mouthed diverticula were found in                            the sigmoid colon.                           Non-bleeding external and internal hemorrhoids were                            found during retroflexion. The hemorrhoids were                            large. Complications:            No immediate complications. Estimated Blood Loss:     Estimated blood loss was minimal. Impression:               - The cecum and IC valve is normal.                           - Diverticulosis in the sigmoid colon.                           - Non-bleeding external and internal hemorrhoids.                           - No specimens collected. Recommendation:           - Patient has a contact number available for                            emergencies. The signs and symptoms of potential                            delayed complications were discussed with the                             patient. Return to normal activities tomorrow.                            Written discharge instructions were provided to the                            patient.                           - Resume previous diet.                           - Continue present medications.                           - Repeat colonoscopy in 10 years for screening                            purposes.                           -  Follow up with Dr Payton Emerald in GI office as needed Mauri Pole, MD 08/12/2020 8:50:09 AM This report has been signed electronically.

## 2020-08-16 ENCOUNTER — Other Ambulatory Visit: Payer: Self-pay

## 2020-08-16 ENCOUNTER — Institutional Professional Consult (permissible substitution) (INDEPENDENT_AMBULATORY_CARE_PROVIDER_SITE_OTHER): Payer: BC Managed Care – PPO | Admitting: Cardiothoracic Surgery

## 2020-08-16 ENCOUNTER — Telehealth: Payer: Self-pay

## 2020-08-16 VITALS — BP 106/69 | HR 100 | Temp 97.9°F | Resp 20 | Ht 60.0 in | Wt 74.0 lb

## 2020-08-16 DIAGNOSIS — R911 Solitary pulmonary nodule: Secondary | ICD-10-CM | POA: Diagnosis not present

## 2020-08-16 NOTE — Progress Notes (Signed)
[address]       [place] [address]             [phone]       Graceanne J Sherk [place] Medical Record #[id number] Date of Birth: [birth year]  Referring: Eunice Blase, MD Primary Care: Eunice Blase, MD Primary Cardiologist: Kate Sable, MD (Inactive)  Chief Complaint:    Chief Complaint  Patient presents with  . Lung Lesion    Surgical Consult, PET Sca  07/09/20, Chest CT 06/23/20     History of Present Illness:    Vanessa Lara 64 y.o. female is seen in the office  today for consideration of biopsy of lung mass.  The patient has a previous history of smoking up to half a pack a day for 15 years, but she quit smoking about 9 years ago.  She has a history of known rheumatoid arthritis.  Is been actively worked up for significant weight loss over the last 8 to 9 months.  She is followed by Dr. Delton Coombes for anemia, and at times has required iron infusions.  In September CT scan of the chest abdomen pelvis was done this was followed by a PET scan October 10.  She comes to the office today to ask about biopsy of the lung.  Patient notes that several days ago she had a colonoscopy to evaluate the area of hypermetabolic activity on the abdominal portion of the PET scan.  She also has an area of hypermetabolic activity low level in the right axillary area.  She notes that over the past month she had been scratched by her pet cats on the right hand.  There is no obvious infection at this point    Current Activity/ Functional Status:  Patient is independent with mobility/ambulation, transfers, ADL's, IADL's.   Zubrod Score: At the time of surgery this patient's most appropriate activity status/level should be described as: []     0    Normal activity, no symptoms []     1    Restricted in physical strenuous activity but ambulatory, able to do out light work [x]     2    Ambulatory and capable of self care, unable to do work activities, up and  about               >50 % of waking hours                              []     3    Only limited self care, in bed greater than 50% of waking hours []     4    Completely disabled, no self care, confined to bed or chair []     5    Moribund   Past Medical History:  Diagnosis Date  . Anemia   . Arthritis   . Cancer (HCC)    Skin  . CHF (congestive heart failure) ([place])   . Collagen vascular disease ([place])   . Depression   . Endometriosis   . High cholesterol   . IBS (irritable bowel syndrome)   . Mitral valve prolapse   . Uterus, adenomyosis     Past Surgical History:  Procedure Laterality Date  . CARPAL TUNNEL RELEASE Right   . COLONOSCOPY    . COLONOSCOPY N/A 12/13/2015   Procedure: COLONOSCOPY;  Surgeon: Rogene Houston, MD;  [facility];  Service: Endoscopy;  Laterality: N/A;  9:55  . Fatty tumor     2011 (left arm)  . TONSILLECTOMY    . TOTAL ABDOMINAL HYSTERECTOMY     2003    Family History  Problem Relation Age of Onset  . Colon cancer Other   . Bone cancer Mother   . Brain cancer Father   . Brain cancer Sister   . Breast cancer Sister   . Renal Disease Sister   . Pancreatic cancer Neg Hx   . Esophageal cancer Neg Hx      Social History   Tobacco Use  Smoking Status Former Smoker  . Packs/day: 0.50  . Years: 33.00  . Pack years: 16.50  . Types: Cigarettes  Smokeless Tobacco Never Used  Tobacco Comment   quit 9 yrs ago    Social History   Substance and Sexual Activity  Alcohol Use No  . Alcohol/week: 0.0 standard drinks     Allergies  Allergen Reactions  . Codeine Other (See Comments)    Stomach pains/constipation- due to IBS  . Hydrocodone Other (See Comments)    She does not want to take this due to intolerance to codeine.  . Methotrexate Derivatives Other (See Comments)    Agitation; pt stated, "I get no sleep; one dose caused no sleep for 7 days and 7 nights - that was when I got a 0.5 injection"    Current Outpatient  Medications  Medication Sig Dispense Refill  . atorvastatin (LIPITOR) 10 MG tablet Take 1 tablet (10 mg total) by mouth daily. (Patient taking differently: Take 5 mg by mouth daily. ) 90 tablet 1  . chlorzoxazone (PARAFON FORTE) 250 MG tablet Take 1 tablet (250 mg total) by mouth 4 (four) times daily as needed for muscle spasms. 60 tablet 3  . diphenoxylate-atropine (LOMOTIL) 2.5-0.025 MG tablet Take 1-2 tablets by mouth 4 (four) times daily as needed for diarrhea or loose stools. 30 tablet 0  . DULoxetine (CYMBALTA) 20 MG capsule Take 1 capsule (20 mg total) by mouth daily. 30 capsule 6  . furosemide (LASIX) 20 MG tablet Take 20 mg by mouth daily as needed.     . gabapentin (NEURONTIN) 100 MG capsule Take 100 mg by mouth 3 (three) times daily. Pt takes as needed     . LORazepam (ATIVAN) 0.5 MG tablet TAKE 1/2 TO 1 TABLET(0.25 TO 0.5 MG) BY MOUTH THREE TIMES DAILY AS NEEDED FOR ANXIETY 30 tablet 1  . meloxicam (MOBIC) 15 MG tablet TAKE 1/2 TO 1 TABLET(7.5 TO 15 MG) BY MOUTH DAILY AS NEEDED FOR PAIN 30 tablet 6  . PARoxetine (PAXIL) 10 MG tablet Take 10 mg by mouth daily.     No current facility-administered medications for this visit.    Pertinent items are noted in HPI.   Review of Systems:     Cardiac Review of Systems: [Y] = yes  or   [ N ] = no   Chest Pain [ n   ]  Resting SOB [  n ] Exertional SOB  [ y ]  Orthopnea [  ]   Pedal Edema [ n  ]    Palpitations [n  ] Syncope  [ n ]   Presyncope [ n  ]   General Review of Systems: [Y] = yes [  ]=no Constitional: recent weight change [  ];  Wt loss over the last 3 months [ y  ] anorexia [  ]; fatigue Blue.Reese  ]; nausea [  ];  night sweats [  ]; fever [  ]; or chills [  ];           Eye : blurred vision [  ]; diplopia [   ]; vision changes [  ];  Amaurosis fugax[  ]; Resp: cough [  ];  wheezing[  ];  hemoptysis[  ]; shortness of breath[  ]; paroxysmal nocturnal dyspnea[  ]; dyspnea on exertion[  ]; or orthopnea[  ];  GI:  gallstones[  ], vomiting[   ];  dysphagia[  ]; melena[  ];  hematochezia [  ]; heartburn[  ];   Hx of  Colonoscopy[  ]; GU: kidney stones [  ]; hematuria[  ];   dysuria [  ];  nocturia[  ];  history of     obstruction [  ]; urinary frequency [  ]             Skin: rash, swelling[  ];, hair loss[  ];  peripheral edema[  ];  or itching[  ]; Musculosketetal: myalgias[  ];  joint swelling[  ];  joint erythema[  ];  joint pain[  ];  back pain[  ];  Heme/Lymph: bruising[  ];  bleeding[  ];  anemia[  ];  Neuro: TIA[  ];  headaches[  ];  stroke[  ];  vertigo[  ];  seizures[  ];   paresthesias[  ];  difficulty walking[  ];  Psych:depression[  ]; anxiety[  ];  Endocrine: diabetes[  ];  thyroid dysfunction[  ];  Immunizations: Flu up to date [  ]; Pneumococcal up to date [  ]; COVID-72 vacination completed  [   ]  Other:     PHYSICAL EXAMINATION: BP 106/69   Pulse 100   Temp 97.9 F (36.6 C) (Skin)   Resp 20   Ht 5' (1.524 m)   Wt 74 lb (33.6 kg)   SpO2 95% Comment: RA  BMI 14.45 kg/m  General appearance: alert, cooperative, appears stated age, cachectic and no distress Head: Normocephalic, without obvious abnormality, atraumatic Neck: no adenopathy, no carotid bruit, no JVD, supple, symmetrical, trachea midline and thyroid not enlarged, symmetric, no tenderness/mass/nodules Lymph nodes: Cervical, supraclavicular, arm normal some slight fullness in the right axillary area compared to the left  Resp: clear to auscultation bilaterally Cardio: regular rate and rhythm, S1, S2 normal, no murmur, click, rub or gallop GI: soft, non-tender; bowel sounds normal; no masses,  no organomegaly Extremities: extremities normal, atraumatic, no cyanosis or edema Neurologic: Grossly normal  Diagnostic Studies & Laboratory data:     Recent Radiology Findings:    CT CHEST ABDOMEN PELVIS W CONTRAST  Result Date: 06/23/2020 CLINICAL DATA:  Unintentional 35 lb weight loss in past 6-8 months. Fatigue. EXAM: CT CHEST, ABDOMEN, AND  PELVIS WITH CONTRAST TECHNIQUE: Multidetector CT imaging of the chest, abdomen and pelvis was performed following the standard protocol during bolus administration of intravenous contrast. CONTRAST:  75mL ISOVUE-300 IOPAMIDOL (ISOVUE-300) INJECTION 61% COMPARISON:  [facility] only CT on 06/13/2018 from [facility] hospital FINDINGS: CT CHEST FINDINGS Cardiovascular: No acute findings. Aortic atherosclerosis noted. Mediastinum/Lymph Nodes: No masses or pathologically enlarged lymph nodes identified. Lungs/Pleura: Moderate centrilobular emphysema noted. No pulmonary infiltrate or mass identified. No effusion present. Musculoskeletal:  No suspicious bone lesions identified. CT ABDOMEN AND PELVIS FINDINGS Hepatobiliary: No masses identified. Gallbladder is unremarkable. No evidence of biliary ductal dilatation. Pancreas:  No mass or inflammatory changes. Spleen:  Within normal limits in size and appearance. Adrenals/Urinary tract: No  masses or hydronephrosis. Small cyst again noted in lower pole of right kidney. Stomach/Bowel: No evidence of obstruction, inflammatory process, or abnormal fluid collections. Vascular/Lymphatic: No pathologically enlarged lymph nodes identified. No abdominal aortic aneurysm. Aortic atherosclerosis noted. Reproductive: Prior hysterectomy noted. Adnexal regions are unremarkable in appearance. Other:  None. Musculoskeletal: No suspicious bone lesions identified. Linear sclerosis in the right sacral ala is consistent with an insufficiency fracture. IMPRESSION: No evidence of neoplasm or other acute findings within the chest, abdomen, or pelvis. Right sacral insufficiency fracture incidentally noted. Aortic Atherosclerosis (ICD10-I70.0) and Emphysema (ICD10-J43.9). Electronically Signed   By: Marlaine Hind M.D.   On: 06/23/2020 11:05   NM PET Image Initial (PI) Skull Base To Thigh  Result Date: 07/09/2020 CLINICAL DATA:  Initial treatment strategy for unintentional weight loss. EXAM: NUCLEAR MEDICINE  PET SKULL BASE TO THIGH TECHNIQUE: 4.9 mCi F-18 FDG was injected intravenously. Full-ring PET imaging was performed from the skull base to thigh after the radiotracer. CT data was obtained and used for attenuation correction and anatomic localization. Fasting blood glucose: 95 mg/dl COMPARISON:  CT examination from 06/23/2020 FINDINGS: Mediastinal blood pool activity: SUV max 1.5 Liver activity: SUV max 2.0 NECK: No significant abnormal hypermetabolic activity in this region. Incidental CT findings: Chronic left maxillary sinusitis. Bilateral common carotid atherosclerotic calcification. CHEST: Right axillary lymph node measuring 1.1 cm in short axis on image 63 of series 4 has a maximum SUV of 4.6 ([place] 4). This is stable in size compared to the prior right shoulder MRI from 12/05/2019. Other smaller right axillary and subpectoral lymph nodes have low-grade activity, for example a 0.6 cm right subpectoral node on image 57 of series 4 has maximum SUV of 1.9 (Deauville 3). Focal activity along the lateral margin of the right trapezius/rhomboid major adjacent to the medial scapula is noted without underlying CT abnormality, maximum SUV 3.0. This is probably physiologic muscular activity but does appear somewhat focal. No adjacent bony abnormality is observed. Incidental CT findings: Centrilobular emphysema. Atherosclerotic thoracic aorta. 4 mm sub solid nodule in the left lower lobe on image 24 of series a without appreciable hypermetabolic activity. ABDOMEN/PELVIS: In the vicinity of the ileocecal valve/terminal ileum, there is focal accentuated metabolic activity with maximum SUV of 9.2. Reviewing this area on the recent CT scan from 06/23/2020 I do not see obvious mass lesion, although the area is more indistinct on the CT data from today's examination. This may well simply be focal physiologic activity, neoplasm involving the ileocecal valve region is a consideration given the focal nature of the accentuated  activity. Incidental CT findings: Aortoiliac atherosclerotic vascular disease. Normal spleen. Probable cyst of the right kidney lower pole laterally. Prominent stool throughout the colon favors constipation. SKELETON: Activity along the wrists and hands attributable to arthropathy. Incidental CT findings: none IMPRESSION: 1. Mildly hypermetabolic right axillary lymph node with maximum SUV of 4.6. Borderline accentuated metabolic activity in small right axillary and subpectoral lymph nodes. Significance of this hypermetabolic activity is uncertain; no hypermetabolic breast mass is identified. Conceivably a low-grade lymphoma could cause some localized adenopathy, the appearance is not considered specific. 2. Accentuated activity along the distal most terminal ileum and ileocecal valve. This may well be physiologic given that I not see a mass or specific abnormality in this region on the recent diagnostic CT of 06/23/2020, but the activity is focal enough to raise concern. Correlate with history of colon cancer screening in determining whether further workup such as colonoscopy is warranted. 3. Nonspecific 4 mm  sub solid nodule in the right lower lobe without appreciable hypermetabolic activity, but below sensitive PET-CT size thresholds. No follow-up needed if patient is low-risk. Non-contrast chest CT can be considered in 12 months if patient is high-risk. This recommendation follows the consensus statement: Guidelines for Management of Incidental Pulmonary Nodules Detected on CT Images: From the Fleischner Society 2017; Radiology 2017; 284:228-243. 4. Other imaging findings of potential clinical significance: Aortic Atherosclerosis (ICD10-I70.0). Prominent stool throughout the colon favors constipation. Arthropathy related activity in the wrists and hands. Chronic left maxillary sinusitis. Emphysema (ICD10-J43.9). Electronically Signed   By: Van Clines M.D.   On: 07/09/2020 15:03    MM DIGITAL SCREENING  BILATERAL  Result Date: 06/24/2020 CLINICAL DATA:  Screening. EXAM: DIGITAL SCREENING BILATERAL MAMMOGRAM WITH CAD COMPARISON:  Previous exam(s). ACR Breast Density Category d: The breast tissue is extremely dense, which lowers the sensitivity of mammography FINDINGS: There are no findings suspicious for malignancy. Images were processed with CAD. IMPRESSION: No mammographic evidence of malignancy. A result letter of this screening mammogram will be mailed directly to the patient. RECOMMENDATION: Screening mammogram in one year. (Code:SM-B-01Y) BI-RADS CATEGORY  1: Negative. Electronically Signed   By: Lajean Manes M.D.   On: 06/24/2020 12:00   I have independently reviewed the above radiology studies  and reviewed the findings with the patient.   I have contacted radiology to review the CT scan as this report does not mention the 4 mm nodule in the RIGHT lower lobe medially as seen on image 24 of the report.  Also radiology will correct the PET scan report as reading through the report the side shifts-the 4 mm nodular area/partial groundglass is in the right lower lobe not the left   Recent Lab Findings: Lab Results  Component Value Date   WBC 15.2 (H) 06/28/2020   HGB 14.0 06/28/2020   HCT 43.4 06/28/2020   PLT 418 (H) 06/28/2020   GLUCOSE 87 04/20/2020   CHOL 191 07/14/2020   TRIG 168 (H) 07/14/2020   HDL 49 (L) 07/14/2020   LDLCALC 114 (H) 07/14/2020   ALT 8 03/19/2020   AST 18 03/19/2020   NA 140 03/19/2020   K 4.0 03/19/2020   CL 102 03/19/2020   CREATININE 0.73 03/19/2020   BUN 11 03/19/2020   CO2 27 03/19/2020   TSH 2.34 05/15/2019   HGBA1C 5.7 (H) 03/19/2020      Assessment / Plan:   #1 at this point the patient has no intrathoracic areas that need biopsy or are particularly suspicious-she is not eligible for lung cancer screening CTs because she did not smoke long enough.  I recommended to her that she obtain a follow-up CT scan not before 6 months but before 12 months,  this could be done through medical oncology and [facility].  Patient notes that she also saw [facility] 3 weeks ago  #2 mild hypermetabolic activity in the right axillary area, could be explained by low-grade infection related to cat scratch fever, or could potentially be a low-grade lymphoma-Dr. Tomie China note suggest that when he sees her next week he was planning on arranging for ultrasound-guided needle biopsy of the right axillary nodes-this seems appropriate  #3 patient notes that she had a colonoscopy last week without any positive findings   On review of the patient's films I do not see any indication that she needs anything biopsied in the chest at this point.  I have contacted radiology and they will correct  the above reports.  I have not made a follow-up appointment for the patient as she is closely followed both by primary care and oncology.      I  spent 60 minutes with  the patient face to face and greater then 50% of the time was spent in counseling and coordination of care.    Grace Isaac MD      [address] [place] [address] Office [phone]     08/16/2020 5:25 PM

## 2020-08-16 NOTE — Telephone Encounter (Signed)
  Follow up Call-  Call back number 08/12/2020 08/04/2020  Post procedure Call Back phone  # [phone]  Permission to leave phone message Yes Yes  Some recent data might be hidden     Patient questions:  Do you have a fever, pain , or abdominal swelling? No. Pain Score  0 *  Have you tolerated food without any problems? Yes.    Have you been able to return to your normal activities? Yes.    Do you have any questions about your discharge instructions: Diet   No. Medications  No. Follow up visit  No.  Do you have questions or concerns about your Care? No.   Patient would like to know why something showed up on the PET scan but nothing during the colonoscopy.    Actions: * If pain score is 4 or above: No action needed, pain <4.  1. Have you developed a fever since your procedure? no  2.   Have you had an respiratory symptoms (SOB or cough) since your procedure? no  3.   Have you tested positive for COVID 19 since your procedure no  4.   Have you had any family members/close contacts diagnosed with the COVID 19 since your procedure?  no   If yes to any of these questions please route to Joylene John, RN and Joella Prince, RN

## 2020-08-16 NOTE — Telephone Encounter (Signed)
It can happen that PET scan can have slightly increased physiologic activity which is not pathologic but colonoscopy did not show any mass lesions in the IC valve.  I did explain this in detail to patient post procedure.  Thank you

## 2020-08-16 NOTE — Telephone Encounter (Signed)
Left message on answering machine. 

## 2020-08-18 ENCOUNTER — Telehealth: Payer: Self-pay | Admitting: Hematology and Oncology

## 2020-08-18 ENCOUNTER — Other Ambulatory Visit: Payer: Self-pay

## 2020-08-18 ENCOUNTER — Ambulatory Visit (INDEPENDENT_AMBULATORY_CARE_PROVIDER_SITE_OTHER): Payer: BC Managed Care – PPO | Admitting: Family Medicine

## 2020-08-18 ENCOUNTER — Encounter: Payer: Self-pay | Admitting: Family Medicine

## 2020-08-18 VITALS — BP 105/71 | HR 106 | Ht 60.0 in | Wt 74.0 lb

## 2020-08-18 DIAGNOSIS — M0579 Rheumatoid arthritis with rheumatoid factor of multiple sites without organ or systems involvement: Secondary | ICD-10-CM | POA: Diagnosis not present

## 2020-08-18 DIAGNOSIS — M25511 Pain in right shoulder: Secondary | ICD-10-CM | POA: Diagnosis not present

## 2020-08-18 DIAGNOSIS — M25512 Pain in left shoulder: Secondary | ICD-10-CM

## 2020-08-18 DIAGNOSIS — G8929 Other chronic pain: Secondary | ICD-10-CM

## 2020-08-18 DIAGNOSIS — R634 Abnormal weight loss: Secondary | ICD-10-CM | POA: Diagnosis not present

## 2020-08-18 DIAGNOSIS — E611 Iron deficiency: Secondary | ICD-10-CM

## 2020-08-18 MED ORDER — CHLORZOXAZONE 250 MG PO TABS: 250.0000 mg | ORAL_TABLET | Freq: Four times a day (QID) | ORAL | 1 refills | Status: DC | PRN

## 2020-08-18 MED ORDER — ATORVASTATIN CALCIUM 10 MG PO TABS: 5.0000 mg | ORAL_TABLET | Freq: Every day | ORAL | 1 refills | Status: DC

## 2020-08-18 NOTE — Progress Notes (Signed)
Office Visit Note   Patient: Vanessa Lara           Date of Birth: 20-Nov-1955           MRN: [id number] Visit Date: 08/18/2020 Requested by: Eunice Blase, MD [address] [place],  [place] [address] PCP: Eunice Blase, MD  Subjective: Chief Complaint  Patient presents with  . Right Shoulder - Pain  . Left Shoulder - Pain  . requesting referral to Dr. Luan Pulling (rheumatologist)    HPI: She is here with bilateral shoulder pain.  She has osteoarthritis as well as rheumatoid arthritis.  She started to develop stiffness in her shoulders.  She wondered whether injections might help.  We just did this couple months ago.  She also wants another oncology opinion in [place].  She would like referral.  She continues to struggle with unintended weight loss.  From a nutrition standpoint she says that her nutritionist spoke to her about lipid infusions.  She wondered how she could go about getting that done.  She needs refills of her pain medication and muscle relaxant.  She has not yet started Cymbalta but plans to do so for her chronic rheumatoid pain.                ROS:   All other systems were reviewed and are negative.  Objective: Vital Signs: BP 105/71   Pulse (!) 106   Ht 5' (1.524 m)   Wt 74 lb (33.6 kg)   BMI 14.45 kg/m   Physical Exam:  General:  Alert and oriented, in no acute distress. Pulm:  Breathing unlabored. Psy:  Normal mood, congruent affect.  Both shoulders have decreased range of motion with early adhesive capsulitis and palpable crepitus on passive range of motion.  Imaging: No results found.  Assessment & Plan: 1.  Bilateral chronic shoulder pain with osteoarthritis and rheumatoid arthritis -Physical therapy referral to PT and hand.  2.  Rheumatoid arthritis -Referral to another rheumatologist per patient request.  3.  Unintended weight loss -Referral to oncology for another opinion per patient request.     Procedures: No procedures  performed  No notes on file     PMFS History: Patient Active Problem List   Diagnosis Date Noted  . Osteoporosis 06/15/2020  . Iron deficiency anemia 04/09/2020  . Primary osteoarthritis involving multiple joints 12/31/2018  . Rheumatoid arthritis involving multiple sites with positive rheumatoid factor ([place]) 12/31/2018  . Irritable bowel syndrome with both constipation and diarrhea 10/28/2018  . Constipation 03/14/2016  . Mitral valve prolapse 11/23/2015  . High cholesterol 11/23/2015   Past Medical History:  Diagnosis Date  . Anemia   . Arthritis   . Cancer (HCC)    Skin  . CHF (congestive heart failure) ([place])   . Collagen vascular disease ([place])   . Depression   . Endometriosis   . High cholesterol   . IBS (irritable bowel syndrome)   . Mitral valve prolapse   . Uterus, adenomyosis     Family History  Problem Relation Age of Onset  . Colon cancer Other   . Bone cancer Mother   . Brain cancer Father   . Brain cancer Sister   . Breast cancer Sister   . Renal Disease Sister   . Pancreatic cancer Neg Hx   . Esophageal cancer Neg Hx     Past Surgical History:  Procedure Laterality Date  . CARPAL TUNNEL RELEASE Right   . COLONOSCOPY    . COLONOSCOPY  N/A 12/13/2015   Procedure: COLONOSCOPY;  Surgeon: Rogene Houston, MD;  Location[facility];  Service: Endoscopy;  Laterality: N/A;  9:55  . Fatty tumor     2011 (left arm)  . TONSILLECTOMY    . TOTAL ABDOMINAL HYSTERECTOMY     2003   Social History   Occupational History  . Occupation: retired  Tobacco Use  . Smoking status: Former Smoker    Packs/day: 0.50    Years: 33.00    Pack years: 16.50    Types: Cigarettes  . Smokeless tobacco: Never Used  . Tobacco comment: quit 9 yrs ago  Vaping Use  . Vaping Use: Never used  Substance and Sexual Activity  . Alcohol use: No    Alcohol/week: 0.0 standard drinks  . Drug use: No  . Sexual activity: Not Currently

## 2020-08-18 NOTE — Telephone Encounter (Signed)
Received a new pt referral from Dr. Junius Roads for a 2nd opinion for wt loss and IDA. [name] has been cld and scheduled to see Dr. Lorenso Courier on 11/22 at 2pm. Pt aware to arrive 15 minutes early.

## 2020-08-20 ENCOUNTER — Telehealth: Payer: Self-pay | Admitting: Family Medicine

## 2020-08-20 NOTE — Telephone Encounter (Signed)
Did she mean the last 2 office visit notes? Where are these to be sent?

## 2020-08-20 NOTE — Telephone Encounter (Signed)
Patient called requesting Terri send her last 2 visit summary

## 2020-08-23 ENCOUNTER — Inpatient Hospital Stay: Payer: BC Managed Care – PPO

## 2020-08-23 ENCOUNTER — Other Ambulatory Visit: Payer: Self-pay

## 2020-08-23 ENCOUNTER — Inpatient Hospital Stay: Payer: BC Managed Care – PPO | Attending: Hematology and Oncology | Admitting: Hematology and Oncology

## 2020-08-23 VITALS — BP 115/70 | HR 102 | Temp 98.4°F | Resp 18 | Ht 60.0 in | Wt 74.8 lb

## 2020-08-23 DIAGNOSIS — D72829 Elevated white blood cell count, unspecified: Secondary | ICD-10-CM | POA: Insufficient documentation

## 2020-08-23 DIAGNOSIS — Z79899 Other long term (current) drug therapy: Secondary | ICD-10-CM | POA: Insufficient documentation

## 2020-08-23 DIAGNOSIS — D72825 Bandemia: Secondary | ICD-10-CM

## 2020-08-23 DIAGNOSIS — D75839 Thrombocytosis, unspecified: Secondary | ICD-10-CM | POA: Insufficient documentation

## 2020-08-23 DIAGNOSIS — R634 Abnormal weight loss: Secondary | ICD-10-CM

## 2020-08-23 LAB — CMP (CANCER CENTER ONLY)
ALT: 7 U/L (ref 0–44)
AST: 13 U/L — ABNORMAL LOW (ref 15–41)
Albumin: 3.6 g/dL (ref 3.5–5.0)
Alkaline Phosphatase: 148 U/L — ABNORMAL HIGH (ref 38–126)
Anion gap: 10 (ref 5–15)
BUN: 10 mg/dL (ref 8–23)
CO2: 26 mmol/L (ref 22–32)
Calcium: 9.2 mg/dL (ref 8.9–10.3)
Chloride: 104 mmol/L (ref 98–111)
Creatinine: 0.69 mg/dL (ref 0.44–1.00)
GFR, Estimated: >60 mL/min (ref >60)
Glucose, Bld: 92 mg/dL (ref 70–99)
Potassium: 3.5 mmol/L (ref 3.5–5.1)
Sodium: 140 mmol/L (ref 135–145)
Total Bilirubin: 0.2 mg/dL — ABNORMAL LOW (ref 0.3–1.2)
Total Protein: 7.2 g/dL (ref 6.5–8.1)

## 2020-08-23 LAB — LACTATE DEHYDROGENASE: LDH: 157 U/L (ref 98–192)

## 2020-08-23 LAB — CBC WITH DIFFERENTIAL (CANCER CENTER ONLY)
Abs Immature Granulocytes: 0.04 10*3/uL (ref 0.00–0.07)
Basophils Absolute: 0.1 10*3/uL (ref 0.0–0.1)
Basophils Relative: 1 %
Eosinophils Absolute: 0.1 10*3/uL (ref 0.0–0.5)
Eosinophils Relative: 1 %
HCT: 37.7 % (ref 36.0–46.0)
Hemoglobin: 12.4 g/dL (ref 12.0–15.0)
Immature Granulocytes: 0 %
Lymphocytes Relative: 22 %
Lymphs Abs: 2.4 10*3/uL (ref 0.7–4.0)
MCH: 30.2 pg (ref 26.0–34.0)
MCHC: 32.9 g/dL (ref 30.0–36.0)
MCV: 92 fL (ref 80.0–100.0)
Monocytes Absolute: 0.6 10*3/uL (ref 0.1–1.0)
Monocytes Relative: 5 %
Neutro Abs: 7.9 10*3/uL — ABNORMAL HIGH (ref 1.7–7.7)
Neutrophils Relative %: 71 %
Platelet Count: 369 10*3/uL (ref 150–400)
RBC: 4.1 MIL/uL (ref 3.87–5.11)
RDW: 14.5 % (ref 11.5–15.5)
WBC Count: 11.1 10*3/uL — ABNORMAL HIGH (ref 4.0–10.5)
nRBC: 0 % (ref 0.0–0.2)

## 2020-08-23 LAB — SAVE SMEAR(SSMR), FOR PROVIDER SLIDE REVIEW

## 2020-08-23 LAB — SEDIMENTATION RATE: Sed Rate: 62 mm/hr — ABNORMAL HIGH (ref 0–22)

## 2020-08-23 LAB — C-REACTIVE PROTEIN: CRP: 1.7 mg/dL — ABNORMAL HIGH (ref <1.0)

## 2020-08-23 NOTE — Progress Notes (Signed)
[place] Telephone:(336) [phone]   Fax:([phone]) [place] NOTE  Patient Care Team: Eunice Blase, MD as PCP - General (Family Medicine) Herminio Commons, MD (Inactive) as PCP - Cardiology (Cardiology) Lahoma Rocker, MD as Consulting Physician (Rheumatology)  Hematological/Oncological History # Leukocytosis/Thrombocytosis 1) 05/15/2019: WBC 11.8, Hgb 12.4, MCV 86.5, Plt 345 2) 03/19/2020: WBC 13.1, Hgb 11.4, MCV 83.6, Plt 431 3) 06/28/2020: WBC 15.2, Hgb 14.0, MCV 89.7, Plt 418 4) 08/23/2020: second opinion with Dr. Lorenso Courier   CHIEF COMPLAINTS/PURPOSE OF CONSULTATION:  "2nd opinion for weight loss "  HISTORY OF PRESENTING ILLNESS:  Vanessa Lara 64 y.o. female with medical history significant for CHF, IBS, mitral valve prolapse, and iron deficiency anemia who presents for a 2nd opinion regarding weight loss/leukocytosis.   On review of the previous records Vanessa Lara last saw Dr. Lamonte Richer on 08/11/2020 with a virtual telephone visit. She was noted to be have JAK2 negative leukocytosis, but recent PET CT scan which showed a PET positive right axillary node, though be to be 2/2 to a cat bite. She was also noted to have a PET positive ileocecal lesion. A colonoscopy performed on 08/12/2020 showed a normal cecum and IC valve, however the terminal ileum could not be intubated due to restrictive sigmoid and redundant colon.  Multiple diverticula were noted throughout the sigmoid colon as well as large nonbleeding external and internal hemorrhoids. Mammogram was performed on 06/23/2020 and was negative.   On exam today [name] reports that she "does not know what is going on".  She believes that she has had a marked decline in her health over the last 12 years.  She reports that she was able to work for 12-hour days 7 days a week and that she would not have any difficulty with fatigue.  She notes now she is becoming more exhausted.  She has had numerous  rheumatological issues including arthritis and a partial tear in her right shoulder.  Unexplained weight loss and currently weighs approximately 74 pounds.  She endorses being up-to-date on colonoscopy which was last performed 08/12/2020 and mammography which was performed on 06/23/2020.  On further discussion she reports that she does have a consult in to rheumatology at [facility] and is currently trying to connect with them.  She reports that she quit smoking 9-1/2 years ago and smoked 1/2 pack/day for approximately 15 to 16 years.  She also notes that she has been maintaining a normal temperature but has occasionally had some issues with chills.  She reports that her bowel movements are "under control" and that she just started Bentyl in order to try to help with this.  She notes that her family history is remarkable for basal cell carcinoma in her mother and her sister died age 11 from a cancer of unknown origin which metastasized to the brain.  She reports that her father also died from a pituitary gland tumor and stroke.  She currently denies having issues with fevers, sweats, nausea, vomiting or diarrhea.  She has no skin rash and no focal infectious symptoms.  A full 10 point ROS is listed below.  MEDICAL HISTORY:  Past Medical History:  Diagnosis Date  . Anemia   . Arthritis   . Cancer (HCC)    Skin  . CHF (congestive heart failure) ([place])   . Collagen vascular disease ([place])   . Depression   . Endometriosis   . High cholesterol   . IBS (irritable bowel syndrome)   .  Mitral valve prolapse   . Uterus, adenomyosis     SURGICAL HISTORY: Past Surgical History:  Procedure Laterality Date  . CARPAL TUNNEL RELEASE Right   . COLONOSCOPY    . COLONOSCOPY N/A 12/13/2015   Procedure: COLONOSCOPY;  Surgeon: Rogene Houston, MD;  Location: [facility];  Service: Endoscopy;  Laterality: N/A;  9:55  . Fatty tumor     2011 (left arm)  . TONSILLECTOMY    . TOTAL ABDOMINAL HYSTERECTOMY      2003    SOCIAL HISTORY: Social History   Socioeconomic History  . Marital status: Single    Spouse name: Not on file  . Number of children: Not on file  . Years of education: Not on file  . Highest education level: Not on file  Occupational History  . Occupation: retired  Tobacco Use  . Smoking status: Former Smoker    Packs/day: 0.50    Years: 33.00    Pack years: 16.50    Types: Cigarettes  . Smokeless tobacco: Never Used  . Tobacco comment: quit 9 yrs ago  Vaping Use  . Vaping Use: Never used  Substance and Sexual Activity  . Alcohol use: No    Alcohol/week: 0.0 standard drinks  . Drug use: No  . Sexual activity: Not Currently  Other Topics Concern  . Not on file  Social History Narrative  . Not on file   Social Determinants of Health   Financial Resource Strain:   . Difficulty of Paying Living Expenses: Not on file  Food Insecurity:   . Worried About Charity fundraiser in the Last Year: Not on file  . Ran Out of Food in the Last Year: Not on file  Transportation Needs:   . Lack of Transportation (Medical): Not on file  . Lack of Transportation (Non-Medical): Not on file  Physical Activity:   . Days of Exercise per Week: Not on file  . Minutes of Exercise per Session: Not on file  Stress:   . Feeling of Stress : Not on file  Social Connections:   . Frequency of Communication with Friends and Family: Not on file  . Frequency of Social Gatherings with Friends and Family: Not on file  . Attends Religious Services: Not on file  . Active Member of Clubs or Organizations: Not on file  . Attends Archivist Meetings: Not on file  . Marital Status: Not on file  Intimate Partner Violence:   . Fear of Current or Ex-Partner: Not on file  . Emotionally Abused: Not on file  . Physically Abused: Not on file  . Sexually Abused: Not on file    FAMILY HISTORY: Family History  Problem Relation Age of Onset  . Colon cancer Other   . Bone cancer Mother    . Brain cancer Father   . Brain cancer Sister   . Breast cancer Sister   . Renal Disease Sister   . Pancreatic cancer Neg Hx   . Esophageal cancer Neg Hx     ALLERGIES:  is allergic to codeine, hydrocodone, and methotrexate derivatives.  MEDICATIONS:  Current Outpatient Medications  Medication Sig Dispense Refill  . atorvastatin (LIPITOR) 10 MG tablet Take 0.5 tablets (5 mg total) by mouth daily. 90 tablet 1  . chlorzoxazone (PARAFON FORTE) 250 MG tablet Take 1 tablet (250 mg total) by mouth 4 (four) times daily as needed for muscle spasms. 180 tablet 1  . diphenoxylate-atropine (LOMOTIL) 2.5-0.025 MG tablet Take 1-2 tablets  by mouth 4 (four) times daily as needed for diarrhea or loose stools. 30 tablet 0  . DULoxetine (CYMBALTA) 20 MG capsule Take 1 capsule (20 mg total) by mouth daily. 30 capsule 6  . furosemide (LASIX) 20 MG tablet Take 20 mg by mouth daily as needed.     . gabapentin (NEURONTIN) 100 MG capsule Take 100 mg by mouth 3 (three) times daily. Pt takes as needed     . LORazepam (ATIVAN) 0.5 MG tablet TAKE 1/2 TO 1 TABLET(0.25 TO 0.5 MG) BY MOUTH THREE TIMES DAILY AS NEEDED FOR ANXIETY 30 tablet 1  . meloxicam (MOBIC) 15 MG tablet TAKE 1/2 TO 1 TABLET(7.5 TO 15 MG) BY MOUTH DAILY AS NEEDED FOR PAIN 30 tablet 6  . PARoxetine (PAXIL) 10 MG tablet Take 10 mg by mouth daily.     No current facility-administered medications for this visit.    REVIEW OF SYSTEMS:   Constitutional: ( - ) fevers, ( + )  chills , ( - ) night sweats Eyes: ( - ) blurriness of vision, ( - ) double vision, ( - ) watery eyes Ears, nose, mouth, throat, and face: ( - ) mucositis, ( - ) sore throat Respiratory: ( - ) cough, ( - ) dyspnea, ( - ) wheezes Cardiovascular: ( - ) palpitation, ( - ) chest discomfort, ( - ) lower extremity swelling Gastrointestinal:  ( - ) nausea, ( - ) heartburn, ( - ) change in bowel habits Skin: ( - ) abnormal skin rashes Lymphatics: ( - ) new lymphadenopathy, ( - ) easy  bruising Neurological: ( - ) numbness, ( - ) tingling, ( - ) new weaknesses Behavioral/Psych: ( - ) mood change, ( - ) new changes  All other systems were reviewed with the patient and are negative.  PHYSICAL EXAMINATION: ECOG PERFORMANCE STATUS: 1 - Symptomatic but completely ambulatory  Vitals:   08/23/20 1416  BP: 115/70  Pulse: (!) 102  Resp: 18  Temp: 98.4 F (36.9 C)  SpO2: 100%   Filed Weights   08/23/20 1416  Weight: 74 lb 12.8 oz (33.9 kg)    GENERAL: well appearing middle aged Caucasian female in NAD  SKIN: skin color, texture, turgor are normal, no rashes or significant lesions EYES: conjunctiva are pink and non-injected, sclera clear LUNGS: clear to auscultation and percussion with normal breathing effort HEART: regular rate & rhythm and no murmurs and no lower extremity edema Musculoskeletal: no cyanosis of digits and no clubbing  PSYCH: alert & oriented x 3, fluent speech NEURO: no focal motor/sensory deficits  LABORATORY DATA:  I have reviewed the data as listed CBC Latest Ref Rng & Units 06/28/2020 03/19/2020 05/15/2019  WBC 4.0 - 10.5 K/uL 15.2(H) 13.1(H) 11.8(H)  Hemoglobin 12.0 - 15.0 g/dL 14.0 11.4(L) 12.4  Hematocrit 36 - 46 % 43.4 33.7(L) 37.1  Platelets 150 - 400 K/uL 418(H) 431(H) 345    CMP Latest Ref Rng & Units 04/20/2020 03/19/2020 11/06/2019  Glucose 65 - 99 mg/dL 87 87 121(H)  BUN 7 - 25 mg/dL - 11 10  Creatinine 0.50 - 0.99 mg/dL - 0.73 0.79  Sodium 135 - 146 mmol/L - 140 141  Potassium 3.5 - 5.3 mmol/L - 4.0 3.7  Chloride 98 - 110 mmol/L - 102 101  CO2 20 - 32 mmol/L - 27 25  Calcium 8.6 - 10.4 mg/dL - 9.5 9.4  Total Protein 6.1 - 8.1 g/dL - 7.3 -  Total Bilirubin 0.2 - 1.2 mg/dL - 0.3 -  AST 10 - 35 U/L - 18 -  ALT 6 - 29 U/L - 8 -    RADIOGRAPHIC STUDIES: US Guided Needle Placement - No Linked Charges  Result Date: 08/06/2020 Limited diagnostic ultrasound of the left volar wrist reveals no evidence of ganglion cyst.  No hyperemia on  power Doppler imaging.  No abnormality seen.   ASSESSMENT & PLAN ZAKARIAH DEJARNETTE 64 y.o. female with medical history significant for CHF, IBS, mitral valve prolapse, and iron deficiency anemia who presents for a 2nd opinion regarding weight loss/leukocytosis.  After review the labs, the records, discussion with the patient the findings are most consistent with an inflammatory process causing leukocytosis and thrombocytosis.  She has numerous rheumatological symptoms including muscle and joint pain.  She is currently being set up for rheumatological evaluation in order to work-up these findings.  In order to assure that there are no alternative etiologies for this patient's leukocytosis we will order an ESR and CRP to check baseline inflammatory levels.  In order to cover all bases we will also order a JAK2 with reflex panel as well as a BCR abl in order to assure the patient does not have a myeloproliferative neoplasm.  I would consider this considerably less likely.  Of note she did previously have Jak 2 V6 65F and exon 12 checked, but she did not have any additional mutations or NexGen ration sequencing performed.  Therefore we will order the full panel.  There is no indication for flow cytometry as this is of neutrophilic predominance.  # Leukocytosis/Thrombocytosis --Today will order inflammatory markers with ESR and CRP. --Additionally we will collect CBC, CMP, LDH --MPN work-up with JAK2 with reflex and BCR abl --We will review peripheral blood film. --Findings are most consistent with chronic inflammation given the neutrophilic predominance lack of other hematological findings --No indication for a bone marrow biopsy at this time. --assure patient establishes with rheumatological clinic. --Return to clinic as needed if leukocytosis worsens or if new symptoms are noted.  #Weight Loss #Underweight --Likely related to her underlying inflammatory condition. --Routine cancer screenings such as  colonoscopy and mammogram were both performed this year. --No clear focal symptoms that would require imaging at this time. --Continue to monitor with PCP.  No orders of the defined types were placed in this encounter.   All questions were answered. The patient knows to call the clinic with any problems, questions or concerns.  A total of more than 60 minutes were spent on this encounter and over half of that time was spent on counseling and coordination of care as outlined above.   Ledell Peoples, MD Department of Hematology/Oncology [place] at [facility] Phone: [phone] Pager: [phone] Email: [email or web address]  08/23/2020 2:31 PM

## 2020-08-24 ENCOUNTER — Telehealth: Payer: Self-pay | Admitting: Orthopaedic Surgery

## 2020-08-24 NOTE — Telephone Encounter (Signed)
Patient called requesting to leave a message for Dr. Ninfa Linden. Patient states to rely message to [facility]. Patient complaints of upper muscle in both arm pains. Please call patient with advice. Patient is a patient of Dr. Junius Roads but asked to send to Dr. Ninfa Linden. Patient phone number is [phone].

## 2020-08-25 NOTE — Telephone Encounter (Signed)
Pt called stating she is beyond the heating pad and she has been dealing with this pain for the past two months.  Pt says the pain has gotten worse.

## 2020-08-25 NOTE — Telephone Encounter (Signed)
I would have her try a heating pad intermittently to each arm at least twice daily and then alternating Tylenol and Aleve and hopefully this will help her muscle pain dissipate.

## 2020-08-25 NOTE — Telephone Encounter (Signed)
See below

## 2020-08-25 NOTE — Telephone Encounter (Signed)
I think she needs an appointment to see Dr. Junius Roads sometime in the next week or 2.  This needs to be evaluated in person.

## 2020-08-25 NOTE — Telephone Encounter (Signed)
Patient scheduled for Monday after next

## 2020-08-29 ENCOUNTER — Encounter: Payer: Self-pay | Admitting: Hematology and Oncology

## 2020-08-31 ENCOUNTER — Inpatient Hospital Stay (HOSPITAL_COMMUNITY): Payer: BC Managed Care – PPO | Admitting: Hematology

## 2020-09-02 ENCOUNTER — Telehealth: Payer: Self-pay

## 2020-09-02 NOTE — Telephone Encounter (Signed)
Patient called she is requesting her  last appointment notes that includes information about her Rheumatoid arthritis and lab results faxed to  [facility]. Patient would like a call back when fax has been sent. Fax:[phone] CB:[phone]

## 2020-09-02 NOTE — Telephone Encounter (Signed)
Would you please send these?

## 2020-09-03 DIAGNOSIS — M9901 Segmental and somatic dysfunction of cervical region: Secondary | ICD-10-CM | POA: Diagnosis not present

## 2020-09-03 DIAGNOSIS — M542 Cervicalgia: Secondary | ICD-10-CM | POA: Diagnosis not present

## 2020-09-03 DIAGNOSIS — M546 Pain in thoracic spine: Secondary | ICD-10-CM | POA: Diagnosis not present

## 2020-09-03 DIAGNOSIS — M9902 Segmental and somatic dysfunction of thoracic region: Secondary | ICD-10-CM | POA: Diagnosis not present

## 2020-09-03 LAB — BCR ABL1 FISH (GENPATH)

## 2020-09-03 LAB — JAK2 (INCLUDING V617F AND EXON 12), MPL,& CALR W/RFL MPN PANEL (NGS)

## 2020-09-06 ENCOUNTER — Ambulatory Visit: Payer: BC Managed Care – PPO | Admitting: Orthopaedic Surgery

## 2020-09-07 ENCOUNTER — Ambulatory Visit (INDEPENDENT_AMBULATORY_CARE_PROVIDER_SITE_OTHER): Payer: BC Managed Care – PPO | Admitting: Nurse Practitioner

## 2020-09-07 ENCOUNTER — Other Ambulatory Visit: Payer: Self-pay

## 2020-09-07 ENCOUNTER — Ambulatory Visit (INDEPENDENT_AMBULATORY_CARE_PROVIDER_SITE_OTHER)
Admission: RE | Admit: 2020-09-07 | Discharge: 2020-09-07 | Disposition: A | Discharge status: Home / Self Care | Payer: BC Managed Care – PPO | Source: Ambulatory Visit | Attending: Nurse Practitioner | Admitting: Nurse Practitioner

## 2020-09-07 ENCOUNTER — Telehealth: Payer: Self-pay | Admitting: *Deleted

## 2020-09-07 ENCOUNTER — Encounter: Payer: Self-pay | Admitting: Nurse Practitioner

## 2020-09-07 VITALS — BP 120/60 | HR 88 | Ht 60.0 in | Wt 74.0 lb

## 2020-09-07 DIAGNOSIS — K5900 Constipation, unspecified: Secondary | ICD-10-CM | POA: Diagnosis not present

## 2020-09-07 NOTE — Telephone Encounter (Signed)
Patient called after completing visit with [facility].  She states that she has had continued weight loss which was reported at today's visit with them.  She states she has a BMI of 14.4.  She states no one can help determine why she is having such a progressive weight loss and wants to see if Dr Lorenso Courier has any further tests to perform to help determine what is wrong.  She also asked for her right axilla nodule to be reevaluated sooner than 6 months as she is concerned it might be contributing.  Patient is extremely concerned and is seeking any type of assistance or direction with her current medical concerns.  She states her bowels have returned to normal and the IBS that she was experiencing is controlled.  Routed to Dr Lorenso Courier for advice.

## 2020-09-07 NOTE — Patient Instructions (Addendum)
If you are age 64 or older, your body mass index should be between 23-30. Your Body mass index is 14.45 kg/m. If this is out of the aforementioned range listed, please consider follow up with your Primary Care Provider.  If you are age 66 or younger, your body mass index should be between 19-25. Your Body mass index is 14.45 kg/m. If this is out of the aformentioned range listed, please consider follow up with your Primary Care Provider.   Your provider has requested that you have an abdominal x ray before leaving today. Please go to the basement floor to our Radiology department for the test.  Further follow up to be determined after x-ray is complete.

## 2020-09-07 NOTE — Progress Notes (Signed)
09/07/2020 [name] [id number] 1956-04-15   Chief Complaint: Follow-up after colonoscopy completed  History of Present Illness: Vanessa Lara is a 64 year old female with a past medical history of depression, rheumatoid arthritis followed by rheumatologist at [facility] and Dr. Kathlene November in [place], [place] secondary to [place], osteoarthritis, bursitis, tendonitis and questionable collagen vascular disorder, lumbar spondylosis, chronic iron deficiency anemia received IV iron 04/2019 and 06/14/2020 and IBS symptoms. S/P total hysterectomy 2003. Initially saw the patient in the office on 08/02/2020 for further evaluation regarding an abnormal PET scan 07/09/2020 which showed metabolic activity at the TI/IC valve area. A colonoscopy was completed by Dr. Silverio Decamp on 08/12/2020 which showed a normal cecum and IC valve. Diverticulosis to the sigmoid colon and nonbleeding internal and external hemorrhoids were identified. She was advised to repeat a colonoscopy in 10 years.  She presents today for further follow-up.  She questions why an abnormality was identified on the PET scan but was not seen on the colonoscopy.  As previously explained to the patient by Dr. Silverio Decamp following her colonoscopy, it can happen that a PET scan can have slightly increased physiologic activity which is not pathologic.  The colonoscopy did not show any evidence of a mass or lesion at the IC valve.  She continues to have IBS discomfort described as lower abdominal pain.  She reports having a bowel movement once every 1 to 2 weeks.  Last bowel movement was 2 weeks ago.  She stated her stool is either soft, formed or loose like mud.  No rectal bleeding.  She is intolerant to fiber, stool softeners and laxatives which results in stomach pain and bloat. No GERD symptoms. Weight remains 74lbs.   CBC Latest Ref Rng & Units 08/23/2020 06/28/2020 03/19/2020  WBC 4.0 - 10.5 K/uL 11.1(H) 15.2(H) 13.1(H)  Hemoglobin 12.0 - 15.0 g/dL 12.4 14.0  11.4(L)  Hematocrit 36 - 46 % 37.7 43.4 33.7(L)  Platelets 150 - 400 K/uL 369 418(H) 431(H)     CMP Latest Ref Rng & Units 08/23/2020 04/20/2020 03/19/2020  Glucose 70 - 99 mg/dL 92 87 87  BUN 8 - 23 mg/dL 10 - 11  Creatinine 0.44 - 1.00 mg/dL 0.69 - 0.73  Sodium 135 - 145 mmol/L 140 - 140  Potassium 3.5 - 5.1 mmol/L 3.5 - 4.0  Chloride 98 - 111 mmol/L 104 - 102  CO2 22 - 32 mmol/L 26 - 27  Calcium 8.9 - 10.3 mg/dL 9.2 - 9.5  Total Protein 6.5 - 8.1 g/dL 7.2 - 7.3  Total Bilirubin 0.3 - 1.2 mg/dL 0.2(L) - 0.3  Alkaline Phos 38 - 126 U/L 148(H) - -  AST 15 - 41 U/L 13(L) - 18  ALT 0 - 44 U/L 7 - 8    Colonoscopy 08/12/2020: - The cecum and IC valve is normal. - Diverticulosis in the sigmoid colon. - Non-bleeding external and internal hemorrhoids. - No specimens collected. - Recall colonoscopy 10 years.  Current Medications, Allergies, Past Medical History, Past Surgical History, Family History and Social History were reviewed in [employer] record.   Review of Systems:   Constitutional: Negative for fever, sweats, chills or weight loss.  Respiratory: Negative for shortness of breath.   Cardiovascular: Negative for chest pain, palpitations and leg swelling.  Gastrointestinal: See HPI.  Musculoskeletal: Negative for back pain or muscle aches.  Neurological: Negative for dizziness, headaches or paresthesias.    Physical Exam: BP 120/60   Pulse 88   Ht  5' (1.524 m)   Wt 74 lb (33.6 kg)   BMI 14.45 kg/m  Wt Readings from Last 3 Encounters:  09/07/20 74 lb (33.6 kg)  08/23/20 74 lb 12.8 oz (33.9 kg)  08/18/20 74 lb (33.6 kg)   General: Cachectic 64 year old female in no acute distress. Head: Normocephalic and atraumatic. Eyes: No scleral icterus. Conjunctiva pink . Ears: Normal auditory acuity. Lungs: Clear throughout to auscultation. Heart: Regular rate and rhythm, no murmur. Abdomen: Soft, nontender and nondistended. No masses or  hepatomegaly. Normal bowel sounds x 4 quadrants.  Musculoskeletal: Symmetrical with no gross deformities. Extremities: No edema. Neurological: Alert oriented x 4. No focal deficits.  Psychological: Alert and cooperative. Normal mood and affect  Assessment and Recommendations:  70. 64 year old female chronic lower abdominal pain. PET scan 42/03/8340 showed metabolic uptake at the TI/IC valve area without a discrete mass on CTAP. S/P colonoscopy 08/12/2020 showed a normal cecum and IC valve (TI was not intubated).  -Next colonoscopy due in 10 years   2. Constipation. No BM x 2 weeks. Abdominal exam today showed a flat nondistended abdomen with + BS x 4 quads.  -Abd xray today to assess for constipation  3. Weight loss -Continue follow up with hematologist/oncologist Dr. Delton Coombes  4. Elevated IgA level -Patient to follow up with PCP and Dr. Delton Coombes for further evaluation   5. IDA -Continue follow up with Dr. Lamonte Richer -Consider small bowel capsule endoscopy to rule out small bowel malignancy, unlikely Crohn's disease, to further discuss with Dr. Tarri Glenn -Patient is due for a follow up with Dr. Lamonte Richer  6. Elevated alk pho level with normal ALT and low AST and T. Bili  -Patient due to have labs done at the cancer center with Dr. Lamonte Richer, recommend repeat hepatic panel with alk phos isoenzymes and GGT level.

## 2020-09-08 NOTE — Progress Notes (Signed)
Thank you for seeing Vanessa Lara in follow-up. If we are concerned about her small bowel, I would favor at CTE over capsule endoscopy to further her evaluation.

## 2020-09-13 ENCOUNTER — Telehealth: Payer: Self-pay | Admitting: *Deleted

## 2020-09-13 NOTE — Telephone Encounter (Signed)
Received vm message from patient regarding her weight loss and labs.  TCT patient, no answer but was able to leave vm message advising that Dr. Lorenso Courier recommends that she see a rheumatologist, as her labs indicate an inflammatory issue. There does not appear to be a hematology component to her symptoms.  Advised for pt to return call if further questions or concrns

## 2020-09-14 ENCOUNTER — Telehealth: Payer: Self-pay | Admitting: *Deleted

## 2020-09-14 NOTE — Telephone Encounter (Signed)
Patient returned call.  She spoke with [facility] yesterday when she was advised that weight loss was not related to hematology in any way.  Patient states she understands that however, she is wanting to see if Dr. Lorenso Courier would follow up with a scan to her right axilla area nodule that showed up on the previous PET scan.  She is worried that what showed up might be contributing to her issues.  Previously some concern was that it could be cat scratch related however, she states she never developed any redness or inflammation, or fever after the scratch.    Routed to Dr Lorenso Courier to advise if any scans are necessary.  Patient is very eager to find a solution to her problems.

## 2020-09-16 ENCOUNTER — Telehealth: Payer: Self-pay | Admitting: *Deleted

## 2020-09-16 ENCOUNTER — Telehealth: Payer: Self-pay | Admitting: Hematology and Oncology

## 2020-09-16 ENCOUNTER — Other Ambulatory Visit: Payer: Self-pay | Admitting: Hematology and Oncology

## 2020-09-16 DIAGNOSIS — R590 Localized enlarged lymph nodes: Secondary | ICD-10-CM

## 2020-09-16 NOTE — Telephone Encounter (Signed)
Received call back from patient. She remains concerned about her weight loss and the lymph node under her arm. Advised that per Dr. Lorenso Courier, , her labs indicate an inflammatory process and needs to be seen by Rheumatology.  Pt states she is waiting on an appt .. she states she is still losing weight and wants something done. Transferred call to Dr. Lorenso Courier.

## 2020-09-16 NOTE — Telephone Encounter (Signed)
Called Vanessa Lara to discuss the plan moving forward.  Patient is concerned because she has lost an additional 2 pounds and is requesting more urgent evaluation of her lymphadenopathy.  I was clear with patient that the prior scan in October showed a 1.1 cm right axillary lymph node that was just barely above the reference range and nonspecific.  This coupled with her marked elevation in inflammatory markers and other symptoms are more concerning for an underlying rheumatological condition.  The patient has had difficulty establishing with rheumatological care and wants to know immediately whether or not this lymph node is malignant.  I discussed with her that though it was not my preference we could proceed with an excisional lymph node biopsy in order to effectively rule out hematological malignancy.  She voiced her support of this plan moving forward though I noted to her that inflammatory condition is much more likely as the cause for her symptoms.  I also noted that there are risks involved with an excisional lymph node biopsy up to and including lymphedema of the extremity.  She voiced understanding and wished for me to place referral.  Referral placed to [facility] surgery as requested.  Ledell Peoples, MD Department of Hematology/Oncology [place] at [facility] Phone: [phone] Pager: [phone] Email: [email or web address]

## 2020-09-17 ENCOUNTER — Telehealth: Payer: Self-pay | Admitting: Hematology and Oncology

## 2020-09-17 DIAGNOSIS — M9901 Segmental and somatic dysfunction of cervical region: Secondary | ICD-10-CM | POA: Diagnosis not present

## 2020-09-17 DIAGNOSIS — M542 Cervicalgia: Secondary | ICD-10-CM | POA: Diagnosis not present

## 2020-09-17 DIAGNOSIS — M9902 Segmental and somatic dysfunction of thoracic region: Secondary | ICD-10-CM | POA: Diagnosis not present

## 2020-09-17 DIAGNOSIS — M546 Pain in thoracic spine: Secondary | ICD-10-CM | POA: Diagnosis not present

## 2020-09-17 NOTE — Telephone Encounter (Signed)
Release: [id number] Faxed referral and medical records to [facility] @ fax#[phone]

## 2020-09-21 ENCOUNTER — Telehealth: Payer: Self-pay | Admitting: Nurse Practitioner

## 2020-09-21 ENCOUNTER — Telehealth: Payer: Self-pay | Admitting: Family Medicine

## 2020-09-21 ENCOUNTER — Other Ambulatory Visit: Payer: Self-pay

## 2020-09-21 DIAGNOSIS — R634 Abnormal weight loss: Secondary | ICD-10-CM

## 2020-09-21 DIAGNOSIS — D509 Iron deficiency anemia, unspecified: Secondary | ICD-10-CM

## 2020-09-21 DIAGNOSIS — K5900 Constipation, unspecified: Secondary | ICD-10-CM

## 2020-09-21 MED ORDER — CYCLOBENZAPRINE HCL 10 MG PO TABS: 10.0000 mg | ORAL_TABLET | Freq: Three times a day (TID) | ORAL | 1 refills | Status: DC | PRN

## 2020-09-21 NOTE — Telephone Encounter (Signed)
I called and advised her the Flexeril Rx was sent in. Appointment scheduled with Dr. Ninfa Linden for 10/13/20 at 10:00.

## 2020-09-21 NOTE — Telephone Encounter (Signed)
Patient questioning what is involved with this imaging, how much contrast she has to drink and what it will show. Answered questions as best as I could and told her some of this information would come from the radiology tech. Instructed she will arrive 90 minutes before the imaging to drink her contrast. She will fast for 4 hours prior to the imaging. Appointment scheduled. Patient did not want that date or time. She will call and reschedule it to a date and time that works for her.

## 2020-09-21 NOTE — Addendum Note (Signed)
Addended by: Virgina Evener A on: 09/21/2020 01:45 PM   Modules accepted: Orders

## 2020-09-21 NOTE — Telephone Encounter (Signed)
Patient called requesting a different pain medication. Patient is asking for flexaril. Please send to pharmacy on file. Also patient is asking for permission to see Dr. Ninfa Linden about bilateral shoulder pains. Please call patient about this matter. Patient phone number is [phone].

## 2020-09-21 NOTE — Telephone Encounter (Signed)
Please advise 

## 2020-09-21 NOTE — Telephone Encounter (Signed)
Inbound call from patient requesting a call back.  States she does want to schedule the CT enterography but would like to talk to someone before.

## 2020-09-21 NOTE — Progress Notes (Signed)
Vanessa Lara, I called the pt and left a detailed msg on her voice mail, if she continues to have constipation, weight loss in the setting of IDA, I would recommend for her to schedule a CT enterography with contrast as recommended by Dr. Tarri Glenn, refer to her addendum to 09/07/2020 office consult. She will need a BMP 2 to 3 days prior to proceeding with the CT enterography. Patient can schedule after the New Year holiday if she prefers. Pls contact the patient to facilitate the CT and labs. Thx

## 2020-09-22 DIAGNOSIS — M9902 Segmental and somatic dysfunction of thoracic region: Secondary | ICD-10-CM | POA: Diagnosis not present

## 2020-09-22 DIAGNOSIS — M546 Pain in thoracic spine: Secondary | ICD-10-CM | POA: Diagnosis not present

## 2020-09-22 DIAGNOSIS — M9901 Segmental and somatic dysfunction of cervical region: Secondary | ICD-10-CM | POA: Diagnosis not present

## 2020-09-22 DIAGNOSIS — M542 Cervicalgia: Secondary | ICD-10-CM | POA: Diagnosis not present

## 2020-09-28 DIAGNOSIS — M9902 Segmental and somatic dysfunction of thoracic region: Secondary | ICD-10-CM | POA: Diagnosis not present

## 2020-09-28 DIAGNOSIS — M546 Pain in thoracic spine: Secondary | ICD-10-CM | POA: Diagnosis not present

## 2020-09-28 DIAGNOSIS — M9901 Segmental and somatic dysfunction of cervical region: Secondary | ICD-10-CM | POA: Diagnosis not present

## 2020-09-28 DIAGNOSIS — M542 Cervicalgia: Secondary | ICD-10-CM | POA: Diagnosis not present

## 2020-09-29 ENCOUNTER — Telehealth: Payer: Self-pay | Admitting: Orthopaedic Surgery

## 2020-09-29 NOTE — Telephone Encounter (Signed)
Pt called asking can dr.blackman order a CT for her shoulders. They both hurt but the left is in a lot of pain! Please give her a phone call [phone]

## 2020-09-29 NOTE — Telephone Encounter (Signed)
CT scans are usually not done for shoulders unless there was profound glenohumeral joint arthritis of the shoulder joint.  She does not have that.  Dr. Prince Rome actually MRI of her right shoulder in March of this year showing significant tendinitis of the rotator cuff.  Certainly we can recommend an MRI both shoulders to rule out rotator cuff tears but we would not recommend CT scans for shoulders.

## 2020-09-29 NOTE — Telephone Encounter (Signed)
See below

## 2020-09-30 ENCOUNTER — Other Ambulatory Visit: Payer: Self-pay

## 2020-09-30 ENCOUNTER — Telehealth: Payer: Self-pay

## 2020-09-30 ENCOUNTER — Other Ambulatory Visit: Payer: Self-pay | Admitting: *Deleted

## 2020-09-30 DIAGNOSIS — R634 Abnormal weight loss: Secondary | ICD-10-CM

## 2020-09-30 DIAGNOSIS — M810 Age-related osteoporosis without current pathological fracture: Secondary | ICD-10-CM

## 2020-09-30 DIAGNOSIS — M0579 Rheumatoid arthritis with rheumatoid factor of multiple sites without organ or systems involvement: Secondary | ICD-10-CM

## 2020-09-30 DIAGNOSIS — Z96612 Presence of left artificial shoulder joint: Secondary | ICD-10-CM

## 2020-09-30 DIAGNOSIS — Z96611 Presence of right artificial shoulder joint: Secondary | ICD-10-CM

## 2020-09-30 NOTE — Telephone Encounter (Signed)
She can certainly see Dr. Prince Rome for an injection in her shoulders if she would like to have one in her shoulders prior to getting an MRI but we do not need to see her.

## 2020-09-30 NOTE — Telephone Encounter (Signed)
Was asking about injection should we hold off

## 2020-09-30 NOTE — Telephone Encounter (Signed)
Patient called regarding last message she would like to schedule a MRI for both of her  shoulders. [phone]

## 2020-09-30 NOTE — Telephone Encounter (Signed)
Magnus Ivan said she can get shoulder injections from [facility] has opening next week

## 2020-09-30 NOTE — Telephone Encounter (Signed)
Patient called she stated she has a appointment with GI 1/23 she wants to know if a order can be sent to a different office so she can get a sooner appt. She stated she cant wait that long to be seen [phone]

## 2020-10-04 ENCOUNTER — Other Ambulatory Visit (HOSPITAL_COMMUNITY): Payer: BC Managed Care – PPO

## 2020-10-06 ENCOUNTER — Telehealth: Payer: Self-pay | Admitting: Family Medicine

## 2020-10-06 ENCOUNTER — Other Ambulatory Visit: Payer: Self-pay | Admitting: General Surgery

## 2020-10-06 ENCOUNTER — Other Ambulatory Visit: Payer: Self-pay

## 2020-10-06 DIAGNOSIS — E611 Iron deficiency: Secondary | ICD-10-CM

## 2020-10-06 DIAGNOSIS — R634 Abnormal weight loss: Secondary | ICD-10-CM | POA: Diagnosis not present

## 2020-10-06 DIAGNOSIS — M069 Rheumatoid arthritis, unspecified: Secondary | ICD-10-CM | POA: Diagnosis not present

## 2020-10-06 DIAGNOSIS — R591 Generalized enlarged lymph nodes: Secondary | ICD-10-CM | POA: Diagnosis not present

## 2020-10-06 NOTE — Telephone Encounter (Signed)
Orders are in

## 2020-10-06 NOTE — Telephone Encounter (Signed)
I called and left voice mail that the orders have been faxed, [phone].

## 2020-10-06 NOTE — Telephone Encounter (Signed)
Please advise 

## 2020-10-06 NOTE — Telephone Encounter (Signed)
Patient called asked if Dr Prince Rome will send an order to [facility] in [place] for blood work. Patient said she is concerned about her iron level. The number to contact patient is [phone]

## 2020-10-07 ENCOUNTER — Ambulatory Visit (HOSPITAL_COMMUNITY)
Admission: RE | Admit: 2020-10-07 | Discharge: 2020-10-07 | Disposition: A | Discharge status: Home / Self Care | Payer: BC Managed Care – PPO | Source: Ambulatory Visit | Attending: Nurse Practitioner | Admitting: Nurse Practitioner

## 2020-10-07 ENCOUNTER — Other Ambulatory Visit: Payer: Self-pay

## 2020-10-07 ENCOUNTER — Encounter (HOSPITAL_COMMUNITY): Payer: Self-pay

## 2020-10-07 DIAGNOSIS — R634 Abnormal weight loss: Secondary | ICD-10-CM | POA: Diagnosis not present

## 2020-10-07 DIAGNOSIS — D509 Iron deficiency anemia, unspecified: Secondary | ICD-10-CM | POA: Insufficient documentation

## 2020-10-07 DIAGNOSIS — K5900 Constipation, unspecified: Secondary | ICD-10-CM

## 2020-10-07 DIAGNOSIS — E611 Iron deficiency: Secondary | ICD-10-CM | POA: Diagnosis not present

## 2020-10-07 LAB — POCT I-STAT CREATININE: Creatinine, Ser: 0.6 mg/dL (ref 0.44–1.00)

## 2020-10-07 MED ORDER — BARIUM SULFATE 0.1 % PO SUSP
ORAL | Status: AC
  Filled 2020-10-07: qty 3

## 2020-10-07 NOTE — Progress Notes (Signed)
Spoke with NP, Vanessa Lara- Vanessa Lara regarding this patient. I informed her of the current situation regarding the CT Scan. [name] was having an extremely bad day. Her rheumatoid arthritis was flaring up causing her extreme pain. Vanessa Lara was very cold. Vanessa Lara was upset that Vanessa Lara had to drink so much contrast, when Vanessa Lara was told it was only 8oz. And had to drink it while Vanessa Lara was here. Vanessa Lara was also concerned of the effects it would have on her bowels. I explained this would not cause constipation. This was different than the contrast given in diagnostic xray. It is also different than the contrast given for abd/pelvis CT scans, which can cause diarrhea. The patient attempted to drink. Vanessa Lara stated Vanessa Lara just couldn't do this study today. Vanessa Lara just felt too bad and hurt too bad.   I told the patient I would work with her if her MD, NP wanted to proceed with this study. I gave her the option to drink at home. Vanessa Lara would need to come and pick up the contrast, VOLUMEN. Vanessa Lara would need to call us the morning of the exam and we would give her the times to drink and what time Vanessa Lara needed to be here. Vanessa Lara currently has good IV access. I did let he know that we had to have an IV for this study and if Vanessa Lara did not have IV access on that day, we would not be able to do the study. Vanessa Lara understood and would proceed as her MD, NP requested. Vanessa Lara was adamant that Vanessa Lara was not refusing study. Vanessa Lara just felt too bad to continue today.

## 2020-10-08 ENCOUNTER — Encounter (HOSPITAL_BASED_OUTPATIENT_CLINIC_OR_DEPARTMENT_OTHER): Payer: Self-pay | Admitting: General Surgery

## 2020-10-08 ENCOUNTER — Telehealth: Payer: Self-pay | Admitting: Family Medicine

## 2020-10-08 ENCOUNTER — Telehealth: Payer: Self-pay

## 2020-10-08 ENCOUNTER — Other Ambulatory Visit: Payer: Self-pay

## 2020-10-08 LAB — CBC WITH DIFFERENTIAL/PLATELET
Basophils Absolute: 0.1 10*3/uL (ref 0.0–0.2)
Basos: 1 %
EOS (ABSOLUTE): 0.1 10*3/uL (ref 0.0–0.4)
Eos: 1 %
Hematocrit: 34 % (ref 34.0–46.6)
Hemoglobin: 11.5 g/dL (ref 11.1–15.9)
Immature Grans (Abs): 0 10*3/uL (ref 0.0–0.1)
Immature Granulocytes: 0 %
Lymphocytes Absolute: 2.9 10*3/uL (ref 0.7–3.1)
Lymphs: 23 %
MCH: 30.8 pg (ref 26.6–33.0)
MCHC: 33.8 g/dL (ref 31.5–35.7)
MCV: 91 fL (ref 79–97)
Monocytes Absolute: 0.7 10*3/uL (ref 0.1–0.9)
Monocytes: 6 %
Neutrophils Absolute: 8.8 10*3/uL — ABNORMAL HIGH (ref 1.4–7.0)
Neutrophils: 69 %
Platelets: 462 10*3/uL — ABNORMAL HIGH (ref 150–450)
RBC: 3.73 x10E6/uL — ABNORMAL LOW (ref 3.77–5.28)
RDW: 13.7 % (ref 11.7–15.4)
WBC: 12.6 10*3/uL — ABNORMAL HIGH (ref 3.4–10.8)

## 2020-10-08 LAB — IRON,TIBC AND FERRITIN PANEL
Ferritin: 281 ng/mL — ABNORMAL HIGH (ref 15–150)
Iron Saturation: 10 % — ABNORMAL LOW (ref 15–55)
Iron: 18 ug/dL — ABNORMAL LOW (ref 27–139)
Total Iron Binding Capacity: 180 ug/dL — ABNORMAL LOW (ref 250–450)
UIBC: 162 ug/dL (ref 118–369)

## 2020-10-08 NOTE — H&P (Signed)
Vanessa Lara Location: [facility] Patient #: [id number] DOB: [birth year] Single / Language: Cleophus Molt / Race: White Female   History of Present Illness  The patient is a 65 year old female who presents with lymphadenopathy. Pt is a very nice 65 yo F referred for consultation by Dr. Delton Coombes for hot lymph node in the right axilla. The patient has a complicated PMH as well as a recent issue of a 30 pound weight loss. She was 103 pounds and is down to 73 pounds. This was unintentional. She has IBS and has seen GI as well as nutrition. She is trying everything she can to gain weight including foods that are bad for her IBS like ice cream and some cheeses. She has "a normal appetite for me" which is probably low for some people, but the appetite has maintained her weight for many years. She has some abdominal pain as well. She was referred to oncology and has seen Dr. Lorenso Courier as well as Dr. Delton Coombes for 2 opinions. PET was negative other than a mildly hypermetabolic node in the right axilla, increased activity at the IC valve, and a non specific 4 mm nodule in the RLL. She has fatigue in addition to some vague abdominal pain (not significantly changed), but no fevers/chills/recent trauma/skin lesions. She had a normal mammogram 06/24/2020.  She has family history of breast cancer in sister, brain cancer in her father, probable multiple myeloma in mother ("bone cancer all over body").    PET 08/16/2020  IMPRESSION: 1. Mildly hypermetabolic right axillary lymph node with maximum SUV of 4.6. Borderline accentuated metabolic activity in small right axillary and subpectoral lymph nodes. Significance of this hypermetabolic activity is uncertain; no hypermetabolic breast mass is identified. Conceivably a low-grade lymphoma could cause some localized adenopathy, the appearance is not considered specific. 2. Accentuated activity along the distal most terminal ileum  and ileocecal valve. This may well be physiologic given that I not see a mass or specific abnormality in this region on the recent diagnostic CT of 06/23/2020, but the activity is focal enough to raise concern. Correlate with history of colon cancer screening in determining whether further workup such as colonoscopy is warranted. 3. Nonspecific 4 mm sub solid nodule in the right lower lobe without appreciable hypermetabolic activity, but below sensitive PET-CT size thresholds. No follow-up needed if patient is low-risk. Non-contrast chest CT can be considered in 12 months if patient is high-risk. This recommendation follows the consensus statement: Guidelines for Management of Incidental Pulmonary Nodules Detected on CT Images: From the Fleischner Society 2017; Radiology 2017; 284:228-243. 4. Other imaging findings of potential clinical significance: Aortic Atherosclerosis (ICD10-I70.0). Prominent stool throughout the colon favors constipation. Arthropathy related activity in the wrists and  CT chest/abd/pelvis 08/16/2020  IMPRESSION: No evidence of neoplasm or other acute findings within the chest, abdomen, or pelvis.  Right sacral insufficiency fracture incidentally noted.  Aortic Atherosclerosis (ICD10-I70.0) and Emphysema (ICD10-J43.9).      Diagnostic Studies History  Pap Smear  >5 years ago  Allergies Codeine Phosphate *ANALGESICS - OPIOID*  HYDROcodone Bitartrate *CHEMICALS*  Methotrexate (PF) *ANALGESICS - ANTI-INFLAMMATORY*  Allergies Reconciled   Medication History  LORazepam (0.5MG Tablet, Oral) Active. Atorvastatin Calcium (10MG Tablet, Oral) Active. Gabapentin (100MG Capsule, Oral) Active. Meloxicam (15MG Tablet, Oral) Active. PARoxetine HCl (10MG Tablet, Oral) Active. Cyclobenzaprine HCl (10MG Tablet, Oral) Active. Furosemide (20MG Tablet, Oral) Active. Medications Reconciled  Family History Breast Cancer  Sister. Cancer   Father.    Review of  Systems Gastrointestinal Present- Excessive gas and Hemorrhoids. Not Present- Abdominal Pain, Bloating, Bloody Stool, Change in Bowel Habits, Chronic diarrhea, Constipation, Difficulty Swallowing, Gets full quickly at meals, Indigestion, Nausea, Rectal Pain and Vomiting. All other systems negative  Vitals Weight: 73.2 lb Height: 60in Body Surface Area: 1.22 m Body Mass Index: 14.3 kg/m  Temp.: 97.52F  Pulse: 84 (Regular)  BP: 102/68(Sitting, Left Arm, Standard)       Physical Exam General Mental Status-Alert. General Appearance-Consistent with stated age. Hydration-Well hydrated. Voice-Normal. Note: very cachectic.   Head and Neck Head-normocephalic, atraumatic with no lesions or palpable masses. Trachea-midline. Thyroid Gland Characteristics - normal size and consistency.  Eye Eyeball - Bilateral-Extraocular movements intact. Sclera/Conjunctiva - Bilateral-No scleral icterus.  Chest and Lung Exam Chest and lung exam reveals -quiet, even and easy respiratory effort with no use of accessory muscles and on auscultation, normal breath sounds, no adventitious sounds and normal vocal resonance. Inspection Chest Wall - Normal. Back - normal.  Cardiovascular Cardiovascular examination reveals -normal heart sounds, regular rate and rhythm with no murmurs and normal pedal pulses bilaterally.  Abdomen Inspection Inspection of the abdomen reveals - No Hernias. Palpation/Percussion Palpation and Percussion of the abdomen reveal - Soft, Non Tender, No Rebound tenderness, No Rigidity (guarding) and No hepatosplenomegaly. Auscultation Auscultation of the abdomen reveals - Bowel sounds normal.  Neurologic Neurologic evaluation reveals -alert and oriented x 3 with no impairment of recent or remote memory. Mental Status-Normal.  Musculoskeletal Global Assessment -Note: no gross deformities.  Normal Exam -  Left-Upper Extremity Strength Normal and Lower Extremity Strength Normal. Normal Exam - Right-Upper Extremity Strength Normal and Lower Extremity Strength Normal.  Lymphatic Head & Neck  General Head & Neck Lymphatics: Bilateral - Description - Normal. Axillary  General Axillary Region: Bilateral - Description - Localized lymphadenopathy (small but firm node in the right axilla. mobile.) . Tenderness - Non Tender. Femoral & Inguinal  Generalized Femoral & Inguinal Lymphatics: Bilateral - Description - No Generalized lymphadenopathy.    Assessment & Plan LYMPHADENOPATHY (R59.1) Impression: In general, I would follow this node, but given the profound weight loss, I will plan excision to rule out lymphoma.  I discussed the surgery and risks with the patient. I also discussed that there is a high likelihood that it will not help with her diagnosis.  I discussed that it is outpatient and will last around an hour. I also discussed specifically lymphedema and seroma. Current Plans You are being scheduled for surgery- Our schedulers will call you.  You should hear from our office's scheduling department within 5 working days about the location, date, and time of surgery. We try to make accommodations for patient's preferences in scheduling surgery, but sometimes the OR schedule or the surgeon's schedule prevents [place] from making those accommodations.  If you have not heard from our office [phone]) in 5 working days, call the office and ask for your surgeon's nurse.  If you have other questions about your diagnosis, plan, or surgery, call the office and ask for your surgeon's nurse.  Pt Education - [facility] General Post-op HCI UNINTENTIONAL WEIGHT LOSS OF MORE THAN 10 POUNDS (R63.4) Impression: Will discuss wtih GI consideration of EGD or upper GI. RHEUMATOID ARTHRITIS (M06.9) Impression: Probably not related to weight loss. Not on any biologics or MTX or high doses of  prednisone.    Signed electronically by Stark Klein, MD Location: [facility] Patient #: [id number] DOB: June 02, 1956 Single / Language: Cleophus Molt / Race: White Female   History  of Present Illness  The patient is a 65 year old female who presents with lymphadenopathy. Pt is a very nice 64 yo F referred for consultation by Dr. Delton Coombes for hot lymph node in the right axilla. The patient has a complicated PMH as well as a recent issue of a 30 pound weight loss. She was 103 pounds and is down to 73 pounds. This was unintentional. She has IBS and has seen GI as well as nutrition. She is trying everything she can to gain weight including foods that are bad for her IBS like ice cream and some cheeses. She has "a normal appetite for me" which is probably low for some people, but the appetite has maintained her weight for many years. She has some abdominal pain as well. She was referred to oncology and has seen Dr. Lorenso Courier as well as Dr. Delton Coombes for 2 opinions. PET was negative other than a mildly hypermetabolic node in the right axilla, increased activity at the IC valve, and a non specific 4 mm nodule in the RLL. She has fatigue in addition to some vague abdominal pain (not significantly changed), but no fevers/chills/recent trauma/skin lesions. She had a normal mammogram 06/24/2020.  She has family history of breast cancer in sister, brain cancer in her father, probable multiple myeloma in mother ("bone cancer all over body").    PET 08/16/2020  IMPRESSION: 1. Mildly hypermetabolic right axillary lymph node with maximum SUV of 4.6. Borderline accentuated metabolic activity in small right axillary and subpectoral lymph nodes. Significance of this hypermetabolic activity is uncertain; no hypermetabolic breast mass is identified. Conceivably a low-grade lymphoma could cause some localized adenopathy, the appearance is not considered specific. 2. Accentuated activity along the  distal most terminal ileum and ileocecal valve. This may well be physiologic given that I not see a mass or specific abnormality in this region on the recent diagnostic CT of 06/23/2020, but the activity is focal enough to raise concern. Correlate with history of colon cancer screening in determining whether further workup such as colonoscopy is warranted. 3. Nonspecific 4 mm sub solid nodule in the right lower lobe without appreciable hypermetabolic activity, but below sensitive PET-CT size thresholds. No follow-up needed if patient is low-risk. Non-contrast chest CT can be considered in 12 months if patient is high-risk. This recommendation follows the consensus statement: Guidelines for Management of Incidental Pulmonary Nodules Detected on CT Images: From the Fleischner Society 2017; Radiology 2017; 284:228-243. 4. Other imaging findings of potential clinical significance: Aortic Atherosclerosis (ICD10-I70.0). Prominent stool throughout the colon favors constipation. Arthropathy related activity in the wrists and  CT chest/abd/pelvis 08/16/2020  IMPRESSION: No evidence of neoplasm or other acute findings within the chest, abdomen, or pelvis.  Right sacral insufficiency fracture incidentally noted.  Aortic Atherosclerosis (ICD10-I70.0) and Emphysema (ICD10-J43.9).      Diagnostic Studies History  Pap Smear  >5 years ago  Allergies Emeline Gins, CMA; 10/06/2020 10:35 AM) Codeine Phosphate *ANALGESICS - OPIOID*  HYDROcodone Bitartrate *CHEMICALS*  Methotrexate (PF) *ANALGESICS - ANTI-INFLAMMATORY*  Allergies Reconciled   Medication History  LORazepam (0.5MG Tablet, Oral) Active. Atorvastatin Calcium (10MG Tablet, Oral) Active. Gabapentin (100MG Capsule, Oral) Active. Meloxicam (15MG Tablet, Oral) Active. PARoxetine HCl (10MG Tablet, Oral) Active. Cyclobenzaprine HCl (10MG Tablet, Oral) Active. Furosemide (20MG Tablet, Oral) Active. Medications  Reconciled  Family History  Breast Cancer  Sister. Cancer  Father.    Review of Systems  Gastrointestinal Present- Excessive gas and Hemorrhoids. Not Present- Abdominal Pain, Bloating, Bloody Stool, Change in Bowel  Habits, Chronic diarrhea, Constipation, Difficulty Swallowing, Gets full quickly at meals, Indigestion, Nausea, Rectal Pain and Vomiting. All other systems negative  Vitals  Weight: 73.2 lb Height: 60in Body Surface Area: 1.22 m Body Mass Index: 14.3 kg/m  Temp.: 97.47F  Pulse: 84 (Regular)  BP: 102/68(Sitting, Left Arm, Standard)       Physical Exam General Mental Status-Alert. General Appearance-Consistent with stated age. Hydration-Well hydrated. Voice-Normal. Note: very cachectic.   Head and Neck Head-normocephalic, atraumatic with no lesions or palpable masses. Trachea-midline. Thyroid Gland Characteristics - normal size and consistency.  Eye Eyeball - Bilateral-Extraocular movements intact. Sclera/Conjunctiva - Bilateral-No scleral icterus.  Chest and Lung Exam Chest and lung exam reveals -quiet, even and easy respiratory effort with no use of accessory muscles and on auscultation, normal breath sounds, no adventitious sounds and normal vocal resonance. Inspection Chest Wall - Normal. Back - normal.  Cardiovascular Cardiovascular examination reveals -normal heart sounds, regular rate and rhythm with no murmurs and normal pedal pulses bilaterally.  Abdomen Inspection Inspection of the abdomen reveals - No Hernias. Palpation/Percussion Palpation and Percussion of the abdomen reveal - Soft, Non Tender, No Rebound tenderness, No Rigidity (guarding) and No hepatosplenomegaly. Auscultation Auscultation of the abdomen reveals - Bowel sounds normal.  Neurologic Neurologic evaluation reveals -alert and oriented x 3 with no impairment of recent or remote memory. Mental  Status-Normal.  Musculoskeletal Global Assessment -Note: no gross deformities.  Normal Exam - Left-Upper Extremity Strength Normal and Lower Extremity Strength Normal. Normal Exam - Right-Upper Extremity Strength Normal and Lower Extremity Strength Normal.  Lymphatic Head & Neck  General Head & Neck Lymphatics: Bilateral - Description - Normal. Axillary  General Axillary Region: Bilateral - Description - Localized lymphadenopathy (small but firm node in the right axilla. mobile.) . Tenderness - Non Tender. Femoral & Inguinal  Generalized Femoral & Inguinal Lymphatics: Bilateral - Description - No Generalized lymphadenopathy.    Assessment & Plan LYMPHADENOPATHY (R59.1) Impression: In general, I would follow this node, but given the profound weight loss, I will plan excision to rule out lymphoma.  I discussed the surgery and risks with the patient. I also discussed that there is a high likelihood that it will not help with her diagnosis.  I discussed that it is outpatient and will last around an hour. I also discussed specifically lymphedema and seroma. Current Plans You are being scheduled for surgery- Our schedulers will call you.  You should hear from our office's scheduling department within 5 working days about the location, date, and time of surgery. We try to make accommodations for patient's preferences in scheduling surgery, but sometimes the OR schedule or the surgeon's schedule prevents [place] from making those accommodations.  If you have not heard from our office [phone]) in 5 working days, call the office and ask for your surgeon's nurse.  If you have other questions about your diagnosis, plan, or surgery, call the office and ask for your surgeon's nurse.  Pt Education - [facility] General Post-op HCI UNINTENTIONAL WEIGHT LOSS OF MORE THAN 10 POUNDS (R63.4) Impression: Will discuss wtih GI consideration of EGD or upper GI. RHEUMATOID ARTHRITIS  (M06.9) Impression: Probably not related to weight loss. Not on any biologics or MTX or high doses of prednisone.

## 2020-10-08 NOTE — Telephone Encounter (Signed)
Patient called needing to know her results of her blood work. The number to contact patient is [phone] 

## 2020-10-08 NOTE — Telephone Encounter (Signed)
Patient called needing to know her results of her blood work. The number to contact patient is [phone]

## 2020-10-08 NOTE — Telephone Encounter (Signed)
Patient called she is requesting a urgent referral to any MRI place she can get into she stated she is in too much pain to wait a month. [phone]

## 2020-10-08 NOTE — Telephone Encounter (Signed)
Patient states she will call around and try to find someone to MRI faster and call us back

## 2020-10-09 ENCOUNTER — Inpatient Hospital Stay (HOSPITAL_COMMUNITY): Admission: RE | Admit: 2020-10-09 | Payer: BC Managed Care – PPO | Source: Ambulatory Visit

## 2020-10-10 ENCOUNTER — Telehealth: Payer: Self-pay | Admitting: Family Medicine

## 2020-10-10 NOTE — Telephone Encounter (Signed)
Iron levels are low again.  She should contact her hematologist regarding possible infusions.

## 2020-10-11 ENCOUNTER — Telehealth: Payer: Self-pay | Admitting: *Deleted

## 2020-10-11 ENCOUNTER — Telehealth: Payer: Self-pay

## 2020-10-11 ENCOUNTER — Other Ambulatory Visit: Payer: Self-pay

## 2020-10-11 ENCOUNTER — Telehealth: Payer: Self-pay | Admitting: Family Medicine

## 2020-10-11 ENCOUNTER — Other Ambulatory Visit (HOSPITAL_COMMUNITY)
Admission: RE | Admit: 2020-10-11 | Discharge: 2020-10-11 | Disposition: A | Discharge status: Home / Self Care | Payer: BC Managed Care – PPO | Source: Ambulatory Visit | Attending: General Surgery | Admitting: General Surgery

## 2020-10-11 DIAGNOSIS — J439 Emphysema, unspecified: Secondary | ICD-10-CM | POA: Diagnosis not present

## 2020-10-11 DIAGNOSIS — R634 Abnormal weight loss: Secondary | ICD-10-CM

## 2020-10-11 DIAGNOSIS — Z01812 Encounter for preprocedural laboratory examination: Secondary | ICD-10-CM | POA: Insufficient documentation

## 2020-10-11 DIAGNOSIS — M069 Rheumatoid arthritis, unspecified: Secondary | ICD-10-CM | POA: Diagnosis not present

## 2020-10-11 DIAGNOSIS — Z681 Body mass index (BMI) 19 or less, adult: Secondary | ICD-10-CM | POA: Diagnosis not present

## 2020-10-11 DIAGNOSIS — Z20822 Contact with and (suspected) exposure to covid-19: Secondary | ICD-10-CM | POA: Insufficient documentation

## 2020-10-11 DIAGNOSIS — Z885 Allergy status to narcotic agent status: Secondary | ICD-10-CM | POA: Diagnosis not present

## 2020-10-11 DIAGNOSIS — I700 Atherosclerosis of aorta: Secondary | ICD-10-CM | POA: Diagnosis not present

## 2020-10-11 DIAGNOSIS — K589 Irritable bowel syndrome without diarrhea: Secondary | ICD-10-CM | POA: Diagnosis not present

## 2020-10-11 DIAGNOSIS — R590 Localized enlarged lymph nodes: Secondary | ICD-10-CM | POA: Diagnosis not present

## 2020-10-11 DIAGNOSIS — Z886 Allergy status to analgesic agent status: Secondary | ICD-10-CM | POA: Diagnosis not present

## 2020-10-11 DIAGNOSIS — R591 Generalized enlarged lymph nodes: Secondary | ICD-10-CM | POA: Diagnosis not present

## 2020-10-11 LAB — SARS CORONAVIRUS 2 (TAT 6-24 HRS): SARS Coronavirus 2: NEGATIVE

## 2020-10-11 NOTE — Telephone Encounter (Signed)
Pt called and her blood results came back and she needs an infusion and how does she go about getting that started. Call her at [phone]

## 2020-10-11 NOTE — Telephone Encounter (Signed)
I called and left this information on the patient's voice mail.   

## 2020-10-11 NOTE — Telephone Encounter (Signed)
EGD scheduled 10/18/20 and COVID test scheduled 10/14/20 @ 2:00pm.(lm on voice mail @[facility]). Instructions reviewed for EGD verbally and mailed.

## 2020-10-11 NOTE — Telephone Encounter (Signed)
Pt states that she needs you to send her medical information to Dr.Dorothy at [facility].

## 2020-10-11 NOTE — Telephone Encounter (Signed)
I called and reached her voice mail - left message that she should contact her hematologist in [place] for possible infusion -- that doctor will be able to see the lab results in her chart.

## 2020-10-11 NOTE — Telephone Encounter (Signed)
-----   Message from Thornton Park, MD sent at 10/08/2020 10:06 AM EST ----- Agree that EGD would be appropriate in the setting of unexplained weight loss and abdominal pain. Can be performed at the endoscopy center. Please call the patient and offer her an EGD with me as suggested by Dr. Barry Dienes.  Thanks.  KLB ----- Message ----- From: Stark Klein, MD Sent: 10/06/2020   7:33 PM EST To: Thornton Park, MD  I saw this lady today with the profound weight loss, RA, and abd pain/IBS.  I was reading through GI notes about her as well.  She is down to 73 pounds.  Do you think it would be reasonable to do EGD on her just to make sure nothing is crazy going on in her stomach?  I think her liver and pancreas have been imaged OK, and she has had colonoscopy and PET.  I am going to do a right axillary lymph node biopsy on her since it was PET avid, but I think it will be low yield.  I just want to make sure we have all exhausted all in our repertoire.  Thanks for your consideration. FB

## 2020-10-11 NOTE — Telephone Encounter (Signed)
Received call from patient requesting an 'infusion'. Asked patient what type of infusion she needed. She states she needs an iron infusion. She had labs done pre-lymph node biopsy and she states her labs show she needs iron.   Reviewed labs with Dr. Lorenso Courier. He states as pt's Ferritin is already high, she does not need an Iron transfusion. Due to inflammatory reasons, Vanessa Lara cannot utilize her already present iron stores.   Advised that pt needed to keep her appt with Rheumatology to work on the inflammation issues and to go ahead with her biopsy tomorrow. Pt frustrated at the late date of her Rheum. appt  TCT made to [place] and spoke with Dr. Melissa Noon nurse. She will work on getting pt in as soon as possible.  She will call back in the next day or two about this appt

## 2020-10-11 NOTE — Telephone Encounter (Signed)
[facility] is on Epic

## 2020-10-12 ENCOUNTER — Other Ambulatory Visit: Payer: Self-pay

## 2020-10-12 ENCOUNTER — Ambulatory Visit (HOSPITAL_BASED_OUTPATIENT_CLINIC_OR_DEPARTMENT_OTHER)
Admission: RE | Admit: 2020-10-12 | Discharge: 2020-10-12 | Disposition: A | Discharge status: Home / Self Care | Payer: BC Managed Care – PPO | Attending: General Surgery | Admitting: General Surgery

## 2020-10-12 ENCOUNTER — Ambulatory Visit (HOSPITAL_BASED_OUTPATIENT_CLINIC_OR_DEPARTMENT_OTHER): Payer: BC Managed Care – PPO | Admitting: Anesthesiology

## 2020-10-12 ENCOUNTER — Encounter (HOSPITAL_BASED_OUTPATIENT_CLINIC_OR_DEPARTMENT_OTHER)
Admission: RE | Disposition: A | Discharge status: Home / Self Care | Payer: Self-pay | Source: Home / Self Care | Attending: General Surgery

## 2020-10-12 ENCOUNTER — Encounter (HOSPITAL_BASED_OUTPATIENT_CLINIC_OR_DEPARTMENT_OTHER): Payer: Self-pay | Admitting: General Surgery

## 2020-10-12 DIAGNOSIS — D509 Iron deficiency anemia, unspecified: Secondary | ICD-10-CM | POA: Diagnosis not present

## 2020-10-12 DIAGNOSIS — I700 Atherosclerosis of aorta: Secondary | ICD-10-CM | POA: Diagnosis not present

## 2020-10-12 DIAGNOSIS — K589 Irritable bowel syndrome without diarrhea: Secondary | ICD-10-CM | POA: Diagnosis not present

## 2020-10-12 DIAGNOSIS — Z886 Allergy status to analgesic agent status: Secondary | ICD-10-CM | POA: Diagnosis not present

## 2020-10-12 DIAGNOSIS — R591 Generalized enlarged lymph nodes: Secondary | ICD-10-CM | POA: Insufficient documentation

## 2020-10-12 DIAGNOSIS — Z885 Allergy status to narcotic agent status: Secondary | ICD-10-CM | POA: Diagnosis not present

## 2020-10-12 DIAGNOSIS — E7800 Pure hypercholesterolemia, unspecified: Secondary | ICD-10-CM | POA: Diagnosis not present

## 2020-10-12 DIAGNOSIS — Z20822 Contact with and (suspected) exposure to covid-19: Secondary | ICD-10-CM | POA: Diagnosis not present

## 2020-10-12 DIAGNOSIS — R590 Localized enlarged lymph nodes: Secondary | ICD-10-CM | POA: Diagnosis not present

## 2020-10-12 DIAGNOSIS — Z681 Body mass index (BMI) 19 or less, adult: Secondary | ICD-10-CM | POA: Diagnosis not present

## 2020-10-12 DIAGNOSIS — I341 Nonrheumatic mitral (valve) prolapse: Secondary | ICD-10-CM | POA: Diagnosis not present

## 2020-10-12 DIAGNOSIS — J439 Emphysema, unspecified: Secondary | ICD-10-CM | POA: Insufficient documentation

## 2020-10-12 DIAGNOSIS — M069 Rheumatoid arthritis, unspecified: Secondary | ICD-10-CM | POA: Insufficient documentation

## 2020-10-12 DIAGNOSIS — R634 Abnormal weight loss: Secondary | ICD-10-CM | POA: Diagnosis not present

## 2020-10-12 HISTORY — DX: Generalized enlarged lymph nodes: R59.1

## 2020-10-12 HISTORY — PX: AXILLARY LYMPH NODE BIOPSY: SHX5737

## 2020-10-12 HISTORY — DX: Anxiety disorder, unspecified: F41.9

## 2020-10-12 SURGERY — AXILLARY LYMPH NODE BIOPSY
Anesthesia: General | Site: Axilla | Laterality: Right

## 2020-10-12 MED ORDER — DEXAMETHASONE SODIUM PHOSPHATE 4 MG/ML IJ SOLN: 4.0000 mg | INTRAMUSCULAR | Status: DC

## 2020-10-12 MED ORDER — CHLORHEXIDINE GLUCONATE CLOTH 2 % EX PADS: 6.0000 | MEDICATED_PAD | Freq: Once | CUTANEOUS | Status: DC

## 2020-10-12 MED ORDER — LACTATED RINGERS IV SOLN: INTRAVENOUS | Status: DC

## 2020-10-12 MED ORDER — FENTANYL CITRATE (PF) 100 MCG/2ML IJ SOLN
INTRAMUSCULAR | Status: AC
  Filled 2020-10-12: qty 2

## 2020-10-12 MED ORDER — ONDANSETRON HCL 4 MG/2ML IJ SOLN: 4.0000 mg | Freq: Once | INTRAMUSCULAR | Status: DC | PRN

## 2020-10-12 MED ORDER — PHENYLEPHRINE 40 MCG/ML (10ML) SYRINGE FOR IV PUSH (FOR BLOOD PRESSURE SUPPORT)
PREFILLED_SYRINGE | INTRAVENOUS | Status: AC
  Filled 2020-10-12: qty 10

## 2020-10-12 MED ORDER — DEXAMETHASONE SODIUM PHOSPHATE 10 MG/ML IJ SOLN
INTRAMUSCULAR | Status: AC
  Filled 2020-10-12: qty 1

## 2020-10-12 MED ORDER — CEFAZOLIN SODIUM-DEXTROSE 2-4 GM/100ML-% IV SOLN
2.0000 g | INTRAVENOUS | Status: AC
  Administered 2020-10-12: 1 g via INTRAVENOUS

## 2020-10-12 MED ORDER — ONDANSETRON HCL 4 MG/2ML IJ SOLN
INTRAMUSCULAR | Status: DC | PRN
  Administered 2020-10-12: 4 mg via INTRAVENOUS

## 2020-10-12 MED ORDER — ONDANSETRON HCL 4 MG/2ML IJ SOLN
INTRAMUSCULAR | Status: AC
  Filled 2020-10-12: qty 2

## 2020-10-12 MED ORDER — ACETAMINOPHEN 500 MG PO TABS
1000.0000 mg | ORAL_TABLET | ORAL | Status: AC
  Administered 2020-10-12: 1000 mg via ORAL

## 2020-10-12 MED ORDER — PHENYLEPHRINE HCL (PRESSORS) 10 MG/ML IV SOLN
INTRAVENOUS | Status: DC | PRN
  Administered 2020-10-12: 40 ug via INTRAVENOUS
  Administered 2020-10-12 (×2): 120 ug via INTRAVENOUS
  Administered 2020-10-12: 200 ug via INTRAVENOUS

## 2020-10-12 MED ORDER — DEXAMETHASONE SODIUM PHOSPHATE 10 MG/ML IJ SOLN
INTRAMUSCULAR | Status: DC | PRN
  Administered 2020-10-12: 4 mg via INTRAVENOUS

## 2020-10-12 MED ORDER — OXYCODONE HCL 5 MG PO TABS: 5.0000 mg | ORAL_TABLET | Freq: Four times a day (QID) | ORAL | 0 refills | Status: DC | PRN

## 2020-10-12 MED ORDER — BUPIVACAINE-EPINEPHRINE (PF) 0.25% -1:200000 IJ SOLN
INTRAMUSCULAR | Status: DC | PRN
Start: 2020-10-12 — End: 2020-10-12
  Administered 2020-10-12: 10 mL

## 2020-10-12 MED ORDER — PHENYLEPHRINE HCL (PRESSORS) 10 MG/ML IV SOLN
INTRAVENOUS | Status: DC | PRN
  Administered 2020-10-12: 40 ug via INTRAVENOUS

## 2020-10-12 MED ORDER — EPHEDRINE SULFATE 50 MG/ML IJ SOLN
INTRAMUSCULAR | Status: DC | PRN
  Administered 2020-10-12 (×3): 15 mg via INTRAVENOUS

## 2020-10-12 MED ORDER — 0.9 % SODIUM CHLORIDE (POUR BTL) OPTIME
TOPICAL | Status: DC | PRN
  Administered 2020-10-12: 120 mL

## 2020-10-12 MED ORDER — BUPIVACAINE-EPINEPHRINE (PF) 0.25% -1:200000 IJ SOLN
INTRAMUSCULAR | Status: AC
  Filled 2020-10-12: qty 30

## 2020-10-12 MED ORDER — PROPOFOL 10 MG/ML IV BOLUS
INTRAVENOUS | Status: DC | PRN
  Administered 2020-10-12: 100 mg via INTRAVENOUS

## 2020-10-12 MED ORDER — FENTANYL CITRATE (PF) 100 MCG/2ML IJ SOLN
25.0000 ug | INTRAMUSCULAR | Status: DC | PRN
Start: 2020-10-12 — End: 2020-10-12

## 2020-10-12 MED ORDER — CEFAZOLIN SODIUM-DEXTROSE 2-4 GM/100ML-% IV SOLN
INTRAVENOUS | Status: AC
  Filled 2020-10-12: qty 100

## 2020-10-12 MED ORDER — LIDOCAINE HCL (PF) 1 % IJ SOLN
INTRAMUSCULAR | Status: AC
  Filled 2020-10-12: qty 30

## 2020-10-12 MED ORDER — LIDOCAINE 2% (20 MG/ML) 5 ML SYRINGE
INTRAMUSCULAR | Status: AC
  Filled 2020-10-12: qty 5

## 2020-10-12 MED ORDER — FENTANYL CITRATE (PF) 100 MCG/2ML IJ SOLN
INTRAMUSCULAR | Status: DC | PRN
  Administered 2020-10-12 (×3): 25 ug via INTRAVENOUS

## 2020-10-12 MED ORDER — ACETAMINOPHEN 500 MG PO TABS
ORAL_TABLET | ORAL | Status: AC
  Filled 2020-10-12: qty 2

## 2020-10-12 MED ORDER — LIDOCAINE HCL (CARDIAC) PF 100 MG/5ML IV SOSY
PREFILLED_SYRINGE | INTRAVENOUS | Status: DC | PRN
  Administered 2020-10-12: 40 mg via INTRAVENOUS

## 2020-10-12 SURGICAL SUPPLY — 55 items
ADH SKN CLS APL DERMABOND .7 (GAUZE/BANDAGES/DRESSINGS) ×1
APL PRP STRL LF DISP 70% ISPRP (MISCELLANEOUS) ×1
BLADE HEX COATED 2.75 (ELECTRODE) ×1 IMPLANT
BLADE SURG 10 STRL SS (BLADE) ×1 IMPLANT
BLADE SURG 15 STRL LF DISP TIS (BLADE) ×1 IMPLANT
BLADE SURG 15 STRL SS (BLADE)
BNDG GAUZE ELAST 4 BULKY (GAUZE/BANDAGES/DRESSINGS) ×1 IMPLANT
CANISTER SUCT 1200ML W/VALVE (MISCELLANEOUS) ×1 IMPLANT
CHLORAPREP W/TINT 26 (MISCELLANEOUS) ×2 IMPLANT
CLIP VESOCCLUDE MED 6/CT (CLIP) ×1 IMPLANT
COVER MAYO STAND STRL (DRAPES) ×2 IMPLANT
COVER WAND RF STERILE (DRAPES) IMPLANT
DECANTER SPIKE VIAL GLASS SM (MISCELLANEOUS) ×1 IMPLANT
DERMABOND ADVANCED (GAUZE/BANDAGES/DRESSINGS) ×1
DERMABOND ADVANCED .7 DNX12 (GAUZE/BANDAGES/DRESSINGS) ×1 IMPLANT
DRAPE UTILITY XL STRL (DRAPES) ×2 IMPLANT
ELECT REM PT RETURN 9FT ADLT (ELECTROSURGICAL) ×2
ELECTRODE REM PT RTRN 9FT ADLT (ELECTROSURGICAL) ×1 IMPLANT
GLOVE BIO SURGEON STRL SZ7 (GLOVE) ×1 IMPLANT
GLOVE BIOGEL PI IND STRL 7.5 (GLOVE) IMPLANT
GLOVE BIOGEL PI INDICATOR 7.5 (GLOVE) ×1
GLOVE SURG ENC MOIS LTX SZ6 (GLOVE) ×2 IMPLANT
GLOVE SURG SS PI 7.0 STRL IVOR (GLOVE) ×1 IMPLANT
GLOVE SURG SS PI 8.0 STRL IVOR (GLOVE) ×1 IMPLANT
GLOVE SURG UNDER POLY LF SZ6.5 (GLOVE) ×2 IMPLANT
GLOVE SURG UNDER POLY LF SZ7 (GLOVE) ×2 IMPLANT
GOWN STRL REIN XL XLG (GOWN DISPOSABLE) ×1 IMPLANT
GOWN STRL REUS W/ TWL LRG LVL3 (GOWN DISPOSABLE) ×1 IMPLANT
GOWN STRL REUS W/ TWL XL LVL3 (GOWN DISPOSABLE) IMPLANT
GOWN STRL REUS W/TWL 2XL LVL3 (GOWN DISPOSABLE) ×2 IMPLANT
GOWN STRL REUS W/TWL LRG LVL3 (GOWN DISPOSABLE) ×2
GOWN STRL REUS W/TWL XL LVL3 (GOWN DISPOSABLE) ×2
NDL HYPO 25X1 1.5 SAFETY (NEEDLE) ×1 IMPLANT
NEEDLE HYPO 25X1 1.5 SAFETY (NEEDLE) ×2 IMPLANT
NS IRRIG 1000ML POUR BTL (IV SOLUTION) ×1 IMPLANT
PACK BASIN DAY SURGERY FS (CUSTOM PROCEDURE TRAY) ×2 IMPLANT
PACK UNIVERSAL I (CUSTOM PROCEDURE TRAY) ×1 IMPLANT
PENCIL SMOKE EVACUATOR (MISCELLANEOUS) ×2 IMPLANT
SLEEVE SCD COMPRESS KNEE MED (MISCELLANEOUS) ×1 IMPLANT
SPONGE LAP 18X18 RF (DISPOSABLE) ×2 IMPLANT
STAPLER VISISTAT 35W (STAPLE) IMPLANT
STOCKINETTE IMPERVIOUS LG (DRAPES) ×1 IMPLANT
STRIP CLOSURE SKIN 1/2X4 (GAUZE/BANDAGES/DRESSINGS) ×1 IMPLANT
SUT MON AB 4-0 PC3 18 (SUTURE) ×1 IMPLANT
SUT VIC AB 2-0 SH 27 (SUTURE)
SUT VIC AB 2-0 SH 27XBRD (SUTURE) IMPLANT
SUT VIC AB 3-0 54X BRD REEL (SUTURE) IMPLANT
SUT VIC AB 3-0 BRD 54 (SUTURE)
SUT VIC AB 3-0 SH 27 (SUTURE) ×2
SUT VIC AB 3-0 SH 27X BRD (SUTURE) IMPLANT
SYR BULB EAR ULCER 3OZ GRN STR (SYRINGE) ×1 IMPLANT
SYR CONTROL 10ML LL (SYRINGE) ×2 IMPLANT
TOWEL GREEN STERILE FF (TOWEL DISPOSABLE) ×2 IMPLANT
TUBE CONNECTING 20X1/4 (TUBING) ×1 IMPLANT
YANKAUER SUCT BULB TIP NO VENT (SUCTIONS) ×1 IMPLANT

## 2020-10-12 NOTE — Op Note (Signed)
Pre op diagnosis:  PET avid right axillary lymph node and weight loss  Post op diagnosis:  Same  Procedure performed: Excisional biopsy deep right axillary lymph node  Surgeon:  Stark Klein, MD  Assistant:  Berniece Salines, MD, PGY6  Anesthesia:  General and local  EBL:  Minimal  Specimen:  R axillary lymph node for lymphoma workup  Procedure:   Patient was identified in the holding area and taken to the operating room and placed supine on the operating room table.  General anesthesia was induced with LMA.  The patient's right axilla and chest were clipped, prepped and draped in sterile fashion.  Time out was performed according to the surgical safety check list.  When all was correct, we continued.    The skin was infiltrated with local anesthetic.  The skin was incised with a #10 blade.  The subcutaneous tissues were divided with the cautery.  A Wietlaner retractor was used to assist with visualization.  The clavipectoral fascia was opened.    A tonsil clamp was used to divide the tissues.  A smaller lymph node was immediately seen and was taken with the cautery.  Much higher up behind the pectoralis border, a prominent lymph node was located.  This was elevated with an Allis clamp.  Hemaclips were used to ligate the lymphovascular channels entering the node from all sides.   Once this was complete, the node was passed off for lymphoma workup.    The cavity was irrigated.  Hemostasis was achieved with the cautery.  The wound was closed with interrupted 3-0 Vicryl deep dermal sutures and 4-0 Monocryl running subcuticular suture.  The wound was cleaned, dried and dressed with Dermabond.    The patient was awakened from anesthesia and taken to the PACU in stable condition.  Needle, sponge, and instrument counts were correct.

## 2020-10-12 NOTE — Transfer of Care (Signed)
Immediate Anesthesia Transfer of Care Note  Patient: Vanessa Lara  Procedure(s) Performed: RIGHT AXILLARY LYMPH NODE BIOPSY EXCISION (Right Axilla)  Patient Location: PACU  Anesthesia Type:General  Level of Consciousness: awake, alert  and oriented  Airway & Oxygen Therapy: Patient Spontanous Breathing and Patient connected to face mask oxygen  Post-op Assessment: Report given to RN and Post -op Vital signs reviewed and stable  Post vital signs: Reviewed and stable  Last Vitals:  Vitals Value Taken Time  BP 89/47 10/12/20 1450  Temp    Pulse 83 10/12/20 1454  Resp 13 10/12/20 1454  SpO2 100 % 10/12/20 1454  Vitals shown include unvalidated device data.  Last Pain:  Vitals:   10/12/20 1019  PainSc: 9          Complications: No complications documented.

## 2020-10-12 NOTE — Discharge Instructions (Addendum)
[place] Office Phone Number [phone]   POST OP INSTRUCTIONS  Always review your discharge instruction sheet given to you by the facility where your surgery was performed.  IF YOU HAVE DISABILITY OR FAMILY LEAVE FORMS, YOU MUST BRING THEM TO THE OFFICE FOR PROCESSING.  DO NOT GIVE THEM TO YOUR DOCTOR.  1. A prescription for pain medication may be given to you upon discharge.  Take your pain medication as prescribed, if needed.  If narcotic pain medicine is not needed, then you may take acetaminophen (Tylenol) or ibuprofen (Advil) as needed. 2. Take your usually prescribed medications unless otherwise directed 3. If you need a refill on your pain medication, please contact your pharmacy.  They will contact our office to request authorization.  Prescriptions will not be filled after 5pm or on week-ends. 4. You should eat very light the first 24 hours after surgery, such as soup, crackers, pudding, etc.  Resume your normal diet the day after surgery 5. It is common to experience some constipation if taking pain medication after surgery.  Increasing fluid intake and taking a stool softener will usually help or prevent this problem from occurring.  A mild laxative (Milk of Magnesia or Miralax) should be taken according to package directions if there are no bowel movements after 48 hours. 6. You may shower in 48 hours.  The surgical glue will flake off in 2-3 weeks.   7. ACTIVITIES:  No strenuous activity or heavy lifting for 1 week.   a. You may drive when you no longer are taking prescription pain medication, you can comfortably wear a seatbelt, and you can safely maneuver your car and apply brakes. b. RETURN TO WORK:  __________1 week if applicable._______________ You should see your doctor in the office for a follow-up appointment approximately three-four weeks after your surgery.    WHEN TO CALL YOUR DOCTOR: 1. Fever over 101.0 2. Nausea and/or vomiting. 3. Extreme  swelling or bruising. 4. Continued bleeding from incision. 5. Increased pain, redness, or drainage from the incision.  The clinic staff is available to answer your questions during regular business hours.  Please don't hesitate to call and ask to speak to one of the nurses for clinical concerns.  If you have a medical emergency, go to the nearest emergency room or call 911.  A surgeon from [facility] is always on call at the hospital.  For further questions, please visit [email or web address]    Post Anesthesia Home Care Instructions  Activity: Get plenty of rest for the remainder of the day. A responsible individual must stay with you for 24 hours following the procedure.  For the next 24 hours, DO NOT: -Drive a car -Paediatric nurse -Drink alcoholic beverages -Take any medication unless instructed by your physician -Make any legal decisions or sign important papers.  Meals: Start with liquid foods such as gelatin or soup. Progress to regular foods as tolerated. Avoid greasy, spicy, heavy foods. If nausea and/or vomiting occur, drink only clear liquids until the nausea and/or vomiting subsides. Call your physician if vomiting continues.  Special Instructions/Symptoms: Your throat may feel dry or sore from the anesthesia or the breathing tube placed in your throat during surgery. If this causes discomfort, gargle with warm salt water. The discomfort should disappear within 24 hours.  If you had a scopolamine patch placed behind your ear for the management of post- operative nausea and/or vomiting:  1. The medication in the patch is effective for 72 hours, after which  it should be removed.  Wrap patch in a tissue and discard in the trash. Wash hands thoroughly with soap and water. 2. You may remove the patch earlier than 72 hours if you experience unpleasant side effects which may include dry mouth, dizziness or visual disturbances. 3. Avoid touching the patch. Wash  your hands with soap and water after contact with the patch.  Tylenol can be taken today at 430pm today.

## 2020-10-12 NOTE — Anesthesia Preprocedure Evaluation (Addendum)
Anesthesia Evaluation  Patient identified by MRN, date of birth, ID band Patient awake    Reviewed: Allergy & Precautions, NPO status , Patient's Chart, lab work & pertinent test results  Airway Mallampati: II  TM Distance: >3 FB Neck ROM: Full    Dental  (+) Teeth Intact   Pulmonary neg pulmonary ROS, former smoker,    Pulmonary exam normal        Cardiovascular + Valvular Problems/Murmurs  Rhythm:Regular Rate:Normal     Neuro/Psych Anxiety Depression    GI/Hepatic Neg liver ROS, IBS   Endo/Other  negative endocrine ROS  Renal/GU negative Renal ROS  negative genitourinary   Musculoskeletal  (+) Arthritis , Rheumatoid disorders,  Axillary lymphadenopathy    Abdominal (+)  Abdomen: soft. Bowel sounds: normal.  Peds  Hematology  (+) anemia ,   Anesthesia Other Findings   Reproductive/Obstetrics                             Anesthesia Physical Anesthesia Plan  ASA: II  Anesthesia Plan: General   Post-op Pain Management:    Induction: Intravenous  PONV Risk Score and Plan: 3 and Ondansetron and Treatment may vary due to age or medical condition  Airway Management Planned: Mask and LMA  Additional Equipment: None  Intra-op Plan:   Post-operative Plan: Extubation in OR  Informed Consent: I have reviewed the patients History and Physical, chart, labs and discussed the procedure including the risks, benefits and alternatives for the proposed anesthesia with the patient or authorized representative who has indicated his/her understanding and acceptance.     Dental advisory given  Plan Discussed with: CRNA  Anesthesia Plan Comments: (ECHO 2019: - Left ventricle: The cavity size was normal. Wall thickness was  normal. Systolic function was normal. The estimated ejection  fraction was in the range of 55% to 60%. Diastolic dysfunction,  grade indeterminate. Normal filling  pressures.  - Regional wall motion abnormality: Mild hypokinesis of the apical  anterior, apical inferior, apical lateral, and apical myocardium.  - Mitral valve: No significant mitral valve prolapse. )        Anesthesia Quick Evaluation

## 2020-10-12 NOTE — Anesthesia Procedure Notes (Signed)
Procedure Name: LMA Insertion Date/Time: 10/12/2020 1:50 PM Performed by: Lavonia Dana, CRNA Pre-anesthesia Checklist: Patient identified, Emergency Drugs available, Suction available and Patient being monitored Patient Re-evaluated:Patient Re-evaluated prior to induction Oxygen Delivery Method: Circle system utilized Preoxygenation: Pre-oxygenation with 100% oxygen Induction Type: IV induction Ventilation: Mask ventilation without difficulty LMA: LMA inserted LMA Size: 3.0 Number of attempts: 1 Airway Equipment and Method: Bite block Placement Confirmation: positive ETCO2 Tube secured with: Tape Dental Injury: Teeth and Oropharynx as per pre-operative assessment

## 2020-10-12 NOTE — Progress Notes (Signed)
Thanks for the followup!  

## 2020-10-12 NOTE — Interval H&P Note (Signed)
History and Physical Interval Note:  10/12/2020 10:24 AM  Drue Dun  has presented today for surgery, with the diagnosis of right axillary lymphadenopathy.  The various methods of treatment have been discussed with the patient and family. After consideration of risks, benefits and other options for treatment, the patient has consented to  Procedure(s): RIGHT AXILLARY LYMPH NODE BIOPSY EXCISION (Right) as a surgical intervention.  The patient's history has been reviewed, patient examined, no change in status, stable for surgery.  I have reviewed the patient's chart and labs.  Questions were answered to the patient's satisfaction.     Stark Klein

## 2020-10-12 NOTE — Anesthesia Postprocedure Evaluation (Signed)
Anesthesia Post Note  Patient: Vanessa Lara  Procedure(s) Performed: RIGHT AXILLARY LYMPH NODE BIOPSY EXCISION (Right Axilla)     Patient location during evaluation: PACU Anesthesia Type: General Level of consciousness: awake and alert Pain management: pain level controlled Vital Signs Assessment: post-procedure vital signs reviewed and stable Respiratory status: spontaneous breathing, nonlabored ventilation, respiratory function stable and patient connected to nasal cannula oxygen Cardiovascular status: blood pressure returned to baseline and stable Postop Assessment: no apparent nausea or vomiting Anesthetic complications: no   No complications documented.  Last Vitals:  Vitals:   10/12/20 1530 10/12/20 1546  BP: 113/64 103/66  Pulse: 91 94  Resp: 13 14  Temp:  36.6 C  SpO2: 93% 94%    Last Pain:  Vitals:   10/12/20 1546  TempSrc: Oral  PainSc: 0-No pain                 Belenda Cruise P Caelen Higinbotham

## 2020-10-13 ENCOUNTER — Telehealth: Payer: Self-pay | Admitting: Orthopaedic Surgery

## 2020-10-13 ENCOUNTER — Encounter (HOSPITAL_BASED_OUTPATIENT_CLINIC_OR_DEPARTMENT_OTHER): Payer: Self-pay | Admitting: General Surgery

## 2020-10-13 ENCOUNTER — Ambulatory Visit: Payer: BC Managed Care – PPO | Admitting: Orthopaedic Surgery

## 2020-10-13 LAB — SURGICAL PATHOLOGY

## 2020-10-14 ENCOUNTER — Other Ambulatory Visit: Payer: Self-pay | Admitting: Gastroenterology

## 2020-10-14 DIAGNOSIS — Z1159 Encounter for screening for other viral diseases: Secondary | ICD-10-CM | POA: Diagnosis not present

## 2020-10-15 ENCOUNTER — Encounter: Payer: Self-pay | Admitting: Family Medicine

## 2020-10-15 ENCOUNTER — Other Ambulatory Visit: Payer: Self-pay

## 2020-10-15 ENCOUNTER — Ambulatory Visit (INDEPENDENT_AMBULATORY_CARE_PROVIDER_SITE_OTHER): Payer: BC Managed Care – PPO | Admitting: Family Medicine

## 2020-10-15 VITALS — BP 95/60 | HR 111 | Ht 60.0 in | Wt 75.6 lb

## 2020-10-15 DIAGNOSIS — M0579 Rheumatoid arthritis with rheumatoid factor of multiple sites without organ or systems involvement: Secondary | ICD-10-CM

## 2020-10-15 DIAGNOSIS — R634 Abnormal weight loss: Secondary | ICD-10-CM

## 2020-10-15 DIAGNOSIS — E611 Iron deficiency: Secondary | ICD-10-CM

## 2020-10-15 LAB — SARS CORONAVIRUS 2 (TAT 6-24 HRS): SARS Coronavirus 2: NEGATIVE

## 2020-10-15 NOTE — Progress Notes (Signed)
Office Visit Note   Patient: Vanessa Lara           Date of Birth: 1956-01-30           MRN: [id number] Visit Date: 10/15/2020 Requested by: Eunice Blase, MD [address] [place],  [place] [address] PCP: Eunice Blase, MD  Subjective: Chief Complaint  Patient presents with  . Other    Follow up for weight check     HPI: She is here for follow-up unintentional weight loss.  Since last visit she had an excisional biopsy.  Afterwards she was given oxycodone for pain.  She took 1/2 tablet and 4 almost half a day she felt dramatically better in all of her joints.  Since then she has taken 1/2 tablet periodically with very good results.  She states that she had not realized how much she has been hurting.  She is scheduled to see rheumatology in March.  She asked about getting an iron infusion, her iron level was low but her ferritin level was elevated so she is not a candidate for infusion due to the inflammatory marker.  She has been having more lung scans done, but it is too soon to schedule that.               ROS:   All other systems were reviewed and are negative.  Objective: Vital Signs: BP 95/60   Pulse (!) 111   Ht 5' (1.524 m)   Wt 75 lb 9.6 oz (34.3 kg)   BMI 14.76 kg/m   Physical Exam:  General:  Alert and oriented, in no acute distress. Pulm:  Breathing unlabored. Psy:  Normal mood, congruent affect.  No exam done.  Imaging: No results found.  Assessment & Plan: 1. chronic weight loss, probably related to uncontrolled rheumatoid arthritis. -Proceed with consultation, consider trying a different medication for this.  If she does not tolerate it, then we could talk about a low-dose of oxycodone as needed for pain.  2. iron deficiency, probably related to uncontrolled rheumatoid arthritis.  3.  History of lung nodule. - Recheck in 6-12 months.     Procedures: No procedures performed        PMFS History: Patient Active Problem List   Diagnosis  Date Noted  . Osteoporosis 06/15/2020  . Iron deficiency anemia 04/09/2020  . Primary osteoarthritis involving multiple joints 12/31/2018  . Rheumatoid arthritis involving multiple sites with positive rheumatoid factor ([place]) 12/31/2018  . Irritable bowel syndrome with both constipation and diarrhea 10/28/2018  . Constipation 03/14/2016  . Mitral valve prolapse 11/23/2015  . High cholesterol 11/23/2015   Past Medical History:  Diagnosis Date  . Anemia   . Anxiety   . Arthritis    RA  . Cancer (HCC)    Skin  . CHF (congestive heart failure) ([place])   . Collagen vascular disease ([place])   . Depression   . Endometriosis   . High cholesterol   . IBS (irritable bowel syndrome)   . Lymphadenopathy   . Mitral valve prolapse   . Uterus, adenomyosis     Family History  Problem Relation Age of Onset  . Colon cancer Other   . Bone cancer Mother   . Brain cancer Father   . Brain cancer Sister   . Breast cancer Sister   . Renal Disease Sister   . Pancreatic cancer Neg Hx   . Esophageal cancer Neg Hx     Past Surgical History:  Procedure Laterality Date  .  AXILLARY LYMPH NODE BIOPSY Right 10/12/2020   Procedure: RIGHT AXILLARY LYMPH NODE BIOPSY EXCISION;  Surgeon: Stark Klein, MD;  Location: [facility];  Service: General;  Laterality: Right;  . CARPAL TUNNEL RELEASE Right   . COLONOSCOPY    . COLONOSCOPY N/A 12/13/2015   Procedure: COLONOSCOPY;  Surgeon: Rogene Houston, MD;  [facility];  Service: Endoscopy;  Laterality: N/A;  9:55  . Fatty tumor     2011 (left arm)  . TONSILLECTOMY    . TOTAL ABDOMINAL HYSTERECTOMY     2003   Social History   Occupational History  . Occupation: retired  Tobacco Use  . Smoking status: Former Smoker    Packs/day: 0.50    Years: 33.00    Pack years: 16.50    Types: Cigarettes  . Smokeless tobacco: Never Used  . Tobacco comment: quit 9 yrs ago  Vaping Use  . Vaping Use: Never used  Substance and Sexual Activity   . Alcohol use: No    Alcohol/week: 0.0 standard drinks  . Drug use: No  . Sexual activity: Not Currently

## 2020-10-15 NOTE — Progress Notes (Signed)
I called and left this info on the patient's voice mail. 

## 2020-10-18 ENCOUNTER — Encounter: Payer: BC Managed Care – PPO | Admitting: Gastroenterology

## 2020-10-19 ENCOUNTER — Other Ambulatory Visit (HOSPITAL_COMMUNITY): Payer: BC Managed Care – PPO

## 2020-10-19 ENCOUNTER — Ambulatory Visit (HOSPITAL_COMMUNITY): Payer: BC Managed Care – PPO

## 2020-10-21 ENCOUNTER — Telehealth: Payer: Self-pay

## 2020-10-21 NOTE — Telephone Encounter (Signed)
Patient called she stated she needs a order to be resent due to MRI not being pre approved ASAP she stated she doesn't care where the order gets sent she needs a appointment soon. CB:[phone]

## 2020-10-21 NOTE — Telephone Encounter (Signed)
Emailed [facility] with central scheduling, they will contact pt

## 2020-10-21 NOTE — Telephone Encounter (Signed)
What's the latest on this MRI referral?

## 2020-10-22 ENCOUNTER — Telehealth: Payer: Self-pay | Admitting: Gastroenterology

## 2020-10-22 ENCOUNTER — Telehealth: Payer: Self-pay | Admitting: Orthopaedic Surgery

## 2020-10-22 ENCOUNTER — Telehealth: Payer: Self-pay | Admitting: Family Medicine

## 2020-10-22 NOTE — Telephone Encounter (Signed)
Discussed with pt that there are no sooner appts available. Did offer to put pt on cancellation list, she has been added to this list. Pt scheduled for covid screen at [facility] 11/24/20 at 10am. Pt aware.

## 2020-10-22 NOTE — Telephone Encounter (Signed)
Pt r/s her EGD that had to be cancelled due to the weather. New EGD date is 11/26/20 but pt wants to know if Dr. Tarri Glenn is ok with pt waiting that long. She stated that her issues have worsened. She also needs a new Covid test appt. Pls call her.

## 2020-10-22 NOTE — Telephone Encounter (Signed)
Patient wanted to let Dr.Blackman the problem has been corrected and that she is schedule for a MRI on 10/29/20 n& to follow up with him on 11/03/20.

## 2020-10-22 NOTE — Telephone Encounter (Signed)
Patient wanted to let Dr.Hilts know she will be following up with Dr. Ninfa Linden after her MRI on the 10/29/20.

## 2020-10-24 ENCOUNTER — Other Ambulatory Visit: Payer: BC Managed Care – PPO

## 2020-10-25 ENCOUNTER — Ambulatory Visit: Payer: BC Managed Care – PPO | Admitting: Orthopaedic Surgery

## 2020-10-27 ENCOUNTER — Telehealth: Payer: Self-pay

## 2020-10-27 ENCOUNTER — Telehealth: Payer: Self-pay | Admitting: Family Medicine

## 2020-10-27 MED ORDER — THERMACARE BACK/HIP MISC: 1.0000 "application " | Freq: Every day | 11 refills | Status: AC

## 2020-10-27 MED ORDER — CVS HYDROCOLLOID PADS EX PADS: 1.0000 "application " | MEDICATED_PAD | Freq: Every day | CUTANEOUS | 6 refills | Status: DC

## 2020-10-27 NOTE — Telephone Encounter (Signed)
She can take 1 daily for about a week, then 1/2 daily for a few days, then stop.

## 2020-10-27 NOTE — Telephone Encounter (Signed)
-----   Message from [place], MD sent at 10/27/2020 11:40 AM EST ----- 80 I was reviewing this patient's chart for other reasons and saw that she called last week requesting an earlier EGD.  I believe I have availability 11/10/19. Please see if her schedule would allow having her endoscopy that day instead of later in February. We could also put her on a cancellation list in the event that an earlier appointment time becomes available. Thanks.

## 2020-10-27 NOTE — Telephone Encounter (Signed)
I called. The patient said all her joints are hurting. She is in bed today because she hurts so badly. She asks if she should take prednisone (has about 20 tabs of 10 mg left from an Rx from last May) or Oxy IR 5 mg that she has left from surgery. Also, the patient mentioned that she has a "small fall" and has some abrasions. She is asking if she can get an Rx sent in for the bandages she has been using: Mudlogger Hydrocolloid Adhesive bandages, along with an Rx for ThermaCare heat wraps for her joints.

## 2020-10-27 NOTE — Addendum Note (Signed)
Addended by: Hortencia Pilar on: 10/27/2020 02:34 PM   Modules accepted: Orders

## 2020-10-27 NOTE — Telephone Encounter (Signed)
I called and advised the patient about the prednisone regimen and that the heat wraps and bandages were sent in to her pharmacy as prescriptions.

## 2020-10-27 NOTE — Telephone Encounter (Signed)
At Dr. Tarri Glenn request, called pt to offer sooner availability. Unable to reach d/t no answer.

## 2020-10-27 NOTE — Telephone Encounter (Signed)
Pt called and said every inch of her body is hurting rn. Should she take a dicyclomine or take prednisone. Please call her at [phone]

## 2020-10-27 NOTE — Telephone Encounter (Signed)
Should she taper these, or just take 1 daily til gone?

## 2020-10-27 NOTE — Telephone Encounter (Signed)
Kylie with [facility] called stating that prior authorization for MRI, shoulder has the wrong facility.  Would like a call back at [phone], [phone]?  Please advise.  Thank you.

## 2020-10-28 ENCOUNTER — Telehealth: Payer: Self-pay | Admitting: Family Medicine

## 2020-10-28 NOTE — Telephone Encounter (Signed)
E 

## 2020-10-29 ENCOUNTER — Ambulatory Visit (HOSPITAL_COMMUNITY): Admission: RE | Admit: 2020-10-29 | Payer: BC Managed Care – PPO | Source: Ambulatory Visit

## 2020-10-29 ENCOUNTER — Encounter: Payer: Self-pay | Admitting: Family Medicine

## 2020-10-29 ENCOUNTER — Ambulatory Visit (HOSPITAL_COMMUNITY)
Admission: RE | Admit: 2020-10-29 | Discharge: 2020-10-29 | Disposition: A | Discharge status: Home / Self Care | Payer: BC Managed Care – PPO | Source: Ambulatory Visit | Attending: Orthopaedic Surgery | Admitting: Orthopaedic Surgery

## 2020-10-29 NOTE — Progress Notes (Signed)
Patient did not show up for MRI appointment.

## 2020-11-03 ENCOUNTER — Ambulatory Visit: Payer: BC Managed Care – PPO | Admitting: Orthopaedic Surgery

## 2020-11-03 ENCOUNTER — Telehealth: Payer: Self-pay | Admitting: Orthopaedic Surgery

## 2020-11-03 NOTE — Telephone Encounter (Signed)
Did you get a denial information on this?

## 2020-11-03 NOTE — Telephone Encounter (Signed)
Patient called. She says that her MRI for her R shoulder was not approved. Says she needs a peer to peer form for her insurance to approve the MRI. Her call back number is [phone]

## 2020-11-05 ENCOUNTER — Telehealth: Payer: Self-pay | Admitting: Family Medicine

## 2020-11-05 NOTE — Telephone Encounter (Signed)
Pt called asking for Terri to give her a call she has several things she wants to touch base with her about and she stated it was very very important; she would like a call either right before lunch or right after.   [phone]

## 2020-11-05 NOTE — Telephone Encounter (Signed)
I did call the patient around 12:40 today. She wanted me to check on the referrals to the rheumatologist and also the prior authorization for her shoulder MRIs (ordered by Dr. Ninfa Linden). The patient did say that she called a rheumatology office in [place] and was given an appointment for 12/21/2020 at 1:45 with Dr. Beryle Lathe [phone]). However, if she can be seen sooner in [place], she would prefer that. I advised her that I will check in with our referral coordinator on these.

## 2020-11-08 ENCOUNTER — Telehealth: Payer: Self-pay | Admitting: Orthopaedic Surgery

## 2020-11-08 ENCOUNTER — Telehealth: Payer: Self-pay | Admitting: Family Medicine

## 2020-11-08 NOTE — Telephone Encounter (Signed)
Noted  

## 2020-11-08 NOTE — Telephone Encounter (Signed)
Denial information was left on [place] desk Friday, it will have to go to P2P.

## 2020-11-08 NOTE — Telephone Encounter (Signed)
We will actually need to see her in the office again and set up some type of therapy or other things that she will have to go through prior to her insurance company approving any MRI for her.

## 2020-11-08 NOTE — Telephone Encounter (Signed)
We still needs to see her in the office for a repeat exam and documenting every thing again since they are not approving her MRI.

## 2020-11-08 NOTE — Telephone Encounter (Signed)
So are both shoulder MRI's denied?

## 2020-11-08 NOTE — Telephone Encounter (Signed)
noted 

## 2020-11-08 NOTE — Telephone Encounter (Signed)
Only the right shoulder has been deniedl

## 2020-11-08 NOTE — Telephone Encounter (Signed)
Patient called to informed Terri that [place] working on medical problems. She states she will know what is going on and if you would like to talk to her call her. Patient phone number is [phone].

## 2020-11-08 NOTE — Telephone Encounter (Signed)
She needs another peer to peer form to be approved for her MRI .[employer] has not yet got the approval

## 2020-11-08 NOTE — Telephone Encounter (Signed)
She states she is NOT going to do PT because it made her other shoulder so painful when she did that before  She wants [place] to write a note or call and states she can't do PT; too much painful

## 2020-11-09 NOTE — Telephone Encounter (Signed)
I gotcha

## 2020-11-09 NOTE — Telephone Encounter (Signed)
Can we just cancel these two MRI's for now? Can we do it or does [facility] need to do it?

## 2020-11-11 ENCOUNTER — Ambulatory Visit (HOSPITAL_COMMUNITY): Payer: BC Managed Care – PPO

## 2020-11-11 ENCOUNTER — Other Ambulatory Visit (HOSPITAL_COMMUNITY): Payer: BC Managed Care – PPO

## 2020-11-11 DIAGNOSIS — M150 Primary generalized (osteo)arthritis: Secondary | ICD-10-CM | POA: Diagnosis not present

## 2020-11-11 DIAGNOSIS — M0579 Rheumatoid arthritis with rheumatoid factor of multiple sites without organ or systems involvement: Secondary | ICD-10-CM | POA: Diagnosis not present

## 2020-11-11 DIAGNOSIS — M2550 Pain in unspecified joint: Secondary | ICD-10-CM | POA: Diagnosis not present

## 2020-11-15 ENCOUNTER — Ambulatory Visit: Payer: Self-pay

## 2020-11-15 ENCOUNTER — Ambulatory Visit (INDEPENDENT_AMBULATORY_CARE_PROVIDER_SITE_OTHER): Payer: BC Managed Care – PPO | Admitting: Orthopaedic Surgery

## 2020-11-15 DIAGNOSIS — M25562 Pain in left knee: Secondary | ICD-10-CM

## 2020-11-15 DIAGNOSIS — M25512 Pain in left shoulder: Secondary | ICD-10-CM

## 2020-11-15 DIAGNOSIS — M25511 Pain in right shoulder: Secondary | ICD-10-CM

## 2020-11-15 DIAGNOSIS — M25561 Pain in right knee: Secondary | ICD-10-CM | POA: Diagnosis not present

## 2020-11-15 DIAGNOSIS — G8929 Other chronic pain: Secondary | ICD-10-CM

## 2020-11-15 MED ORDER — LIDOCAINE HCL 1 % IJ SOLN
3.0000 mL | INTRAMUSCULAR | Status: AC | PRN
  Administered 2020-11-15: 3 mL

## 2020-11-15 MED ORDER — METHYLPREDNISOLONE ACETATE 40 MG/ML IJ SUSP
40.0000 mg | INTRAMUSCULAR | Status: AC | PRN
  Administered 2020-11-15: 40 mg via INTRA_ARTICULAR

## 2020-11-15 NOTE — Progress Notes (Signed)
Office Visit Note   Patient: Vanessa Lara           Date of Birth: Oct 28, 1955           MRN: [id number] Visit Date: 11/15/2020              Requested by: Eunice Blase, MD [address] [place],  [place] [address] PCP: Eunice Blase, MD   Assessment & Plan: Visit Diagnoses:  1. Chronic right shoulder pain   2. Chronic pain of both knees   3. Chronic left shoulder pain     Plan: I think most of her pain is related to RA.  I did provide steroid injections in both subacromial spaces of the shoulders today per her request.  I would not recommend a repeat MRI for now.  I would like to see her back in 3 months to see how the effect of the rheumatoid medications have been for her.  Follow-Up Instructions: Return in about 3 months (around 02/12/2021).   Orders:  Orders Placed This Encounter  Procedures  . Large Joint Inj  . Large Joint Inj  . XR Shoulder Right  . XR Knee 1-2 Views Right  . XR Knee 1-2 Views Left   No orders of the defined types were placed in this encounter.     Procedures: Large Joint Inj: R subacromial bursa on 11/15/2020 3:48 PM Indications: pain and diagnostic evaluation Details: 22 G 1.5 in needle  Arthrogram: No  Medications: 3 mL lidocaine 1 %; 40 mg methylPREDNISolone acetate 40 MG/ML Outcome: tolerated well, no immediate complications Procedure, treatment alternatives, risks and benefits explained, specific risks discussed. Consent was given by the patient. Immediately prior to procedure a time out was called to verify the correct patient, procedure, equipment, support staff and site/side marked as required. Patient was prepped and draped in the usual sterile fashion.   Large Joint Inj: L subacromial bursa on 11/15/2020 3:48 PM Indications: pain and diagnostic evaluation Details: 22 G 1.5 in needle  Arthrogram: No  Medications: 3 mL lidocaine 1 %; 40 mg methylPREDNISolone acetate 40 MG/ML Outcome: tolerated well, no immediate  complications Procedure, treatment alternatives, risks and benefits explained, specific risks discussed. Consent was given by the patient. Immediately prior to procedure a time out was called to verify the correct patient, procedure, equipment, support staff and site/side marked as required. Patient was prepped and draped in the usual sterile fashion.       Clinical Data: No additional findings.   Subjective: Chief Complaint  Patient presents with  . Right Shoulder - Pain  The patient comes in with chronic bilateral shoulder pain and now has bilateral knee pain.  She is now seeing a rheumatologist and has been started on medications for RA.  She had an MRI of her right shoulder in March of last year showing severe tendinitis and tendinosis of the rotator cuff but no tear.  She still has chronic pain and wanted a repeat MRI.  However, she is recently had some lymph nodes removed from her right axilla area just last month and there are surgical clips all around this area.  She is in tears in terms of the pain she has in both shoulders and both knees today.  She is a very cachectic individual.  HPI  Review of Systems Today she denies any shortness of breath or chest pain.  She denies any fever and chills.  Objective: Vital Signs: There were no vitals taken for this visit.  Physical  Exam She is alert and oriented and in no acute distress but is tearful. Ortho Exam Examination of both shoulder show that I can move both shoulders.  She is tiny but I would not be comfortable with performing arthroscopic interventions on the shoulders because she is so small.  Both shoulders have pain past 9 degrees of abduction.  She can hold an abducted.  There is certainly some grinding and crepitation in the shoulders but not at the glenohumeral joint.  Both knees were examined today and have some slight patellofemoral crepitation of the left knee.  There is no knee joint effusion.  Both knees have excellent  range of motion and are ligamentously stable. Specialty Comments:  No specialty comments available.  Imaging: XR Knee 1-2 Views Left  Result Date: 11/15/2020 2 views left knee show no acute findings with a well-maintained joint space.  XR Knee 1-2 Views Right  Result Date: 11/15/2020 2 views of of the right knee show no acute findings well-maintained joint space.  XR Shoulder Right  Result Date: 11/15/2020 3 views of the right shoulder show no acute findings.    PMFS History: Patient Active Problem List   Diagnosis Date Noted  . Osteoporosis 06/15/2020  . Iron deficiency anemia 04/09/2020  . Primary osteoarthritis involving multiple joints 12/31/2018  . Rheumatoid arthritis involving multiple sites with positive rheumatoid factor ([place]) 12/31/2018  . Irritable bowel syndrome with both constipation and diarrhea 10/28/2018  . Constipation 03/14/2016  . Mitral valve prolapse 11/23/2015  . High cholesterol 11/23/2015   Past Medical History:  Diagnosis Date  . Anemia   . Anxiety   . Arthritis    RA  . Cancer (HCC)    Skin  . CHF (congestive heart failure) ([place])   . Collagen vascular disease ([place])   . Depression   . Endometriosis   . High cholesterol   . IBS (irritable bowel syndrome)   . Lymphadenopathy   . Mitral valve prolapse   . Uterus, adenomyosis     Family History  Problem Relation Age of Onset  . Colon cancer Other   . Bone cancer Mother   . Brain cancer Father   . Brain cancer Sister   . Breast cancer Sister   . Renal Disease Sister   . Pancreatic cancer Neg Hx   . Esophageal cancer Neg Hx     Past Surgical History:  Procedure Laterality Date  . AXILLARY LYMPH NODE BIOPSY Right 10/12/2020   Procedure: RIGHT AXILLARY LYMPH NODE BIOPSY EXCISION;  Surgeon: Stark Klein, MD;  Location: [facility];  Service: General;  Laterality: Right;  . CARPAL TUNNEL RELEASE Right   . COLONOSCOPY    . COLONOSCOPY N/A 12/13/2015   Procedure:  COLONOSCOPY;  Surgeon: Rogene Houston, MD;  [facility];  Service: Endoscopy;  Laterality: N/A;  9:55  . Fatty tumor     2011 (left arm)  . TONSILLECTOMY    . TOTAL ABDOMINAL HYSTERECTOMY     2003   Social History   Occupational History  . Occupation: retired  Tobacco Use  . Smoking status: Former Smoker    Packs/day: 0.50    Years: 33.00    Pack years: 16.50    Types: Cigarettes  . Smokeless tobacco: Never Used  . Tobacco comment: quit 9 yrs ago  Vaping Use  . Vaping Use: Never used  Substance and Sexual Activity  . Alcohol use: No    Alcohol/week: 0.0 standard drinks  . Drug  use: No  . Sexual activity: Not Currently       

## 2020-11-17 DIAGNOSIS — M9901 Segmental and somatic dysfunction of cervical region: Secondary | ICD-10-CM | POA: Diagnosis not present

## 2020-11-17 DIAGNOSIS — M542 Cervicalgia: Secondary | ICD-10-CM | POA: Diagnosis not present

## 2020-11-17 DIAGNOSIS — M546 Pain in thoracic spine: Secondary | ICD-10-CM | POA: Diagnosis not present

## 2020-11-17 DIAGNOSIS — M9902 Segmental and somatic dysfunction of thoracic region: Secondary | ICD-10-CM | POA: Diagnosis not present

## 2020-11-24 ENCOUNTER — Other Ambulatory Visit: Payer: Self-pay | Admitting: Gastroenterology

## 2020-11-24 ENCOUNTER — Telehealth: Payer: Self-pay | Admitting: Gastroenterology

## 2020-11-24 DIAGNOSIS — Z1159 Encounter for screening for other viral diseases: Secondary | ICD-10-CM | POA: Diagnosis not present

## 2020-11-24 NOTE — Telephone Encounter (Signed)
Spoke to patient to inform her that she will still be able to have her EGD with a permanent   bridge in her mouth. She was advised to remind the nurse during her admission process. Patient voiced understanding.

## 2020-11-25 LAB — SARS CORONAVIRUS 2 (TAT 6-24 HRS): SARS Coronavirus 2: NEGATIVE

## 2020-11-26 ENCOUNTER — Encounter: Payer: Self-pay | Admitting: Gastroenterology

## 2020-11-26 ENCOUNTER — Other Ambulatory Visit: Payer: Self-pay

## 2020-11-26 ENCOUNTER — Ambulatory Visit (AMBULATORY_SURGERY_CENTER): Payer: BC Managed Care – PPO | Admitting: Gastroenterology

## 2020-11-26 VITALS — BP 99/56 | HR 90 | Temp 98.4°F | Resp 18 | Ht 60.0 in | Wt 74.0 lb

## 2020-11-26 DIAGNOSIS — K219 Gastro-esophageal reflux disease without esophagitis: Secondary | ICD-10-CM

## 2020-11-26 DIAGNOSIS — K2210 Ulcer of esophagus without bleeding: Secondary | ICD-10-CM | POA: Diagnosis not present

## 2020-11-26 DIAGNOSIS — K2970 Gastritis, unspecified, without bleeding: Secondary | ICD-10-CM

## 2020-11-26 DIAGNOSIS — R634 Abnormal weight loss: Secondary | ICD-10-CM

## 2020-11-26 DIAGNOSIS — K200 Eosinophilic esophagitis: Secondary | ICD-10-CM

## 2020-11-26 DIAGNOSIS — K2289 Other specified disease of esophagus: Secondary | ICD-10-CM | POA: Diagnosis not present

## 2020-11-26 DIAGNOSIS — K2950 Unspecified chronic gastritis without bleeding: Secondary | ICD-10-CM | POA: Diagnosis not present

## 2020-11-26 MED ORDER — SODIUM CHLORIDE 0.9 % IV SOLN: 500.0000 mL | Freq: Once | INTRAVENOUS | Status: DC

## 2020-11-26 NOTE — Progress Notes (Signed)
VS taken by C.W. 

## 2020-11-26 NOTE — Op Note (Signed)
[place] Patient Name: Vanessa Lara Procedure Date: 11/26/2020 9:57 AM MRN: [id number] Endoscopist: Thornton Park MD, MD Age: 65 Referring MD:  Date of Birth: Feb 27, 1956 Gender: Female Account #: [id number] Procedure:                Upper GI endoscopy Indications:              Weight loss Medicines:                Monitored Anesthesia Care Procedure:                Pre-Anesthesia Assessment:                           - Prior to the procedure, a History and Physical                            was performed, and patient medications and                            allergies were reviewed. The patient's tolerance of                            previous anesthesia was also reviewed. The risks                            and benefits of the procedure and the sedation                            options and risks were discussed with the patient.                            All questions were answered, and informed consent                            was obtained. Prior Anticoagulants: The patient has                            taken no previous anticoagulant or antiplatelet                            agents. ASA Grade Assessment: III - A patient with                            severe systemic disease. After reviewing the risks                            and benefits, the patient was deemed in                            satisfactory condition to undergo the procedure.                           After obtaining informed consent, the endoscope was  passed under direct vision. Throughout the                            procedure, the patient's blood pressure, pulse, and                            oxygen saturations were monitored continuously. The                            Endoscope was introduced through the mouth, and                            advanced to the third part of duodenum. The upper                            GI endoscopy was accomplished without  difficulty.                            The patient tolerated the procedure well. Scope In: Scope Out: Findings:                 The mid-esophagus appearaed slightly ringed.                            Biopsies were taken with a cold forceps from the                            mid/proximal and distal esophagus for histology.                            Estimated blood loss was minimal. The z-line is                            located 37 cm from the incisors.                           Patchy mildly erythematous mucosa without bleeding                            was found in the gastric body. Biopsies were taken                            from the antrum, body, and fundus with a cold                            forceps for histology. Estimated blood loss was                            minimal.                           The examined duodenum was normal. Biopsies were                            taken  with a cold forceps for histology. Estimated                            blood loss was minimal. Complications:            No immediate complications. Estimated blood loss:                            Minimal. Estimated Blood Loss:     Estimated blood loss was minimal. Impression:               - Mildly-ringed apperance to the mid esophagus.                            Biopsied.                           - Erythematous mucosa in the gastric body. Biopsied.                           - Normal examined duodenum. Biopsied. Recommendation:           - Patient has a contact number available for                            emergencies. The signs and symptoms of potential                            delayed complications were discussed with the                            patient. Return to normal activities tomorrow.                            Written discharge instructions were provided to the                            patient.                           - Resume previous diet.                           -  Continue present medications.                           - Await pathology results.                           - No aspirin, ibuprofen, naproxen, or other                            non-steroidal anti-inflammatory drugs.                           - Proceed with CT enterography for further  evaluation if biopsies are negative. Thornton Park MD, MD 11/26/2020 10:30:20 AM This report has been signed electronically.

## 2020-11-26 NOTE — Patient Instructions (Signed)
YOU HAD AN ENDOSCOPIC PROCEDURE TODAY AT THE [facility]:   Refer to the procedure report that was given to you for any specific questions about what was found during the examination.  If the procedure report does not answer your questions, please call your gastroenterologist to clarify.  If you requested that your care partner not be given the details of your procedure findings, then the procedure report has been included in a sealed envelope for you to review at your convenience later.  YOU SHOULD EXPECT: Some feelings of bloating in the abdomen. Passage of more gas than usual.  Walking can help get rid of the air that was put into your GI tract during the procedure and reduce the bloating. If you had a lower endoscopy (such as a colonoscopy or flexible sigmoidoscopy) you may notice spotting of blood in your stool or on the toilet paper. If you underwent a bowel prep for your procedure, you may not have a normal bowel movement for a few days.  Please Note:  You might notice some irritation and congestion in your nose or some drainage.  This is from the oxygen used during your procedure.  There is no need for concern and it should clear up in a day or so.  SYMPTOMS TO REPORT IMMEDIATELY:   Following lower endoscopy (colonoscopy or flexible sigmoidoscopy):  Excessive amounts of blood in the stool  Significant tenderness or worsening of abdominal pains  Swelling of the abdomen that is new, acute  Fever of 100F or higher   Following upper endoscopy (EGD)  Vomiting of blood or coffee ground material  New chest pain or pain under the shoulder blades  Painful or persistently difficult swallowing  New shortness of breath  Fever of 100F or higher  Black, tarry-looking stools  For urgent or emergent issues, a gastroenterologist can be reached at any hour by calling [phone]. Do not use MyChart messaging for urgent concerns.    DIET:  We do recommend a small meal at first, but  then you may proceed to your regular diet.  Drink plenty of fluids but you should avoid alcoholic beverages for 24 hours.  ACTIVITY:  You should plan to take it easy for the rest of today and you should NOT DRIVE or use heavy machinery until tomorrow (because of the sedation medicines used during the test).    FOLLOW UP: Our staff will call the number listed on your records 48-72 hours following your procedure to check on you and address any questions or concerns that you may have regarding the information given to you following your procedure. If we do not reach you, we will leave a message.  We will attempt to reach you two times.  During this call, we will ask if you have developed any symptoms of COVID 19. If you develop any symptoms (ie: fever, flu-like symptoms, shortness of breath, cough etc.) before then, please call [phone].  If you test positive for Covid 19 in the 2 weeks post procedure, please call and report this information to us.    If any biopsies were taken you will be contacted by phone or by letter within the next 1-3 weeks.  Please call us at [phone] if you have not heard about the biopsies in 3 weeks.    SIGNATURES/CONFIDENTIALITY: You and/or your care partner have signed paperwork which will be entered into your electronic medical record.  These signatures attest to the fact that that the information above on   your After Visit Summary has been reviewed and is understood.  Full responsibility of the confidentiality of this discharge information lies with you and/or your care-partner. 

## 2020-11-26 NOTE — Progress Notes (Signed)
Per Dr. Tarri Glenn, she needs to be seen by [facility] as soon as an opening available.

## 2020-11-26 NOTE — Progress Notes (Signed)
Oral bite block placed while patient awake. Pt confirms comfort. Encouraged pt not to bite down on piece.

## 2020-11-26 NOTE — Progress Notes (Signed)
Called to room to assist during endoscopic procedure.  Patient ID and intended procedure confirmed with present staff. Received instructions for my participation in the procedure from the performing physician.  

## 2020-11-26 NOTE — Progress Notes (Signed)
pt tolerated well. VSS. awake and to recovery. Report given to RN. Bite block insitu to recovery. 

## 2020-11-30 ENCOUNTER — Telehealth: Payer: Self-pay

## 2020-11-30 ENCOUNTER — Telehealth: Payer: Self-pay | Admitting: *Deleted

## 2020-11-30 NOTE — Telephone Encounter (Signed)
No answer, left message to call if having any issues or concerns, B.Louetta Hollingshead RN 

## 2020-11-30 NOTE — Telephone Encounter (Signed)
  Follow up Call-  Call back number 11/26/2020 08/12/2020 08/04/2020  Post procedure Call Back phone  # [phone]  Permission to leave phone message Yes Yes Yes  Some recent data might be hidden    [facility]

## 2020-12-02 ENCOUNTER — Telehealth: Payer: Self-pay

## 2020-12-02 NOTE — Telephone Encounter (Signed)
I called and left voice mail to call back. Cannot find evidence in the chart that Dr. Junius Roads prescribed hydrocodone recently. Clarification needed.

## 2020-12-02 NOTE — Telephone Encounter (Signed)
Patient called she stated her rx for hydrocodone requires pre authorization call [phone]

## 2020-12-03 ENCOUNTER — Ambulatory Visit: Payer: BC Managed Care – PPO | Admitting: Family Medicine

## 2020-12-03 DIAGNOSIS — M150 Primary generalized (osteo)arthritis: Secondary | ICD-10-CM | POA: Diagnosis not present

## 2020-12-03 DIAGNOSIS — M0579 Rheumatoid arthritis with rheumatoid factor of multiple sites without organ or systems involvement: Secondary | ICD-10-CM | POA: Diagnosis not present

## 2020-12-03 DIAGNOSIS — R768 Other specified abnormal immunological findings in serum: Secondary | ICD-10-CM | POA: Diagnosis not present

## 2020-12-03 DIAGNOSIS — M2550 Pain in unspecified joint: Secondary | ICD-10-CM | POA: Diagnosis not present

## 2020-12-03 NOTE — Telephone Encounter (Signed)
Patient has ov with Dr. Junius Roads next Tuesday, 3/08 --- can discuss then.

## 2020-12-07 ENCOUNTER — Ambulatory Visit: Payer: BC Managed Care – PPO | Admitting: Family Medicine

## 2020-12-07 ENCOUNTER — Other Ambulatory Visit: Payer: Self-pay | Admitting: Family Medicine

## 2020-12-09 ENCOUNTER — Other Ambulatory Visit: Payer: Self-pay

## 2020-12-09 ENCOUNTER — Other Ambulatory Visit: Payer: Self-pay | Admitting: Gastroenterology

## 2020-12-09 DIAGNOSIS — D509 Iron deficiency anemia, unspecified: Secondary | ICD-10-CM

## 2020-12-09 DIAGNOSIS — R109 Unspecified abdominal pain: Secondary | ICD-10-CM

## 2020-12-09 DIAGNOSIS — R634 Abnormal weight loss: Secondary | ICD-10-CM

## 2020-12-09 MED ORDER — PANTOPRAZOLE SODIUM 40 MG PO TBEC: 40.0000 mg | DELAYED_RELEASE_TABLET | Freq: Two times a day (BID) | ORAL | 1 refills | Status: DC

## 2020-12-10 ENCOUNTER — Telehealth: Payer: Self-pay

## 2020-12-10 ENCOUNTER — Telehealth: Payer: Self-pay | Admitting: Gastroenterology

## 2020-12-10 NOTE — Telephone Encounter (Signed)
Pt wanted to let us know that the CT entero needs a prior auth, it has not been scheduled yet. She also wanted to know if she could use clenpiq for the CT entero. Explained to pt that she cannot use it as it is for a colonoscopy not contrast for the CT. Pt still not convinced. Instructed pt to ask radiology as we do not order contrast to radiology studies.

## 2020-12-10 NOTE — Telephone Encounter (Signed)
APPROVAL  Medication: Pantoprazole Insurance Company: [employer] [place] response: APPROVED Approval dates: 12/10/20 through 12/09/21 Misc. Notes: As written below  Vanessa Lara (Key: [facility])  This request has received a Favorable outcome from [place].  Please keep in mind this is not a guarantee of payment. Eligibility and Benefit determinations will be made at the time of service.  Please note any additional information provided by [facility] at the bottom of the screen.  Vanessa Lara KeyLolita Lenz - Rx #: [id number] Need help? Call us at [phone] Outcome Approvedtoday Effective from 12/10/2020 through 12/09/2021. Drug Pantoprazole Sodium 40MG  dr tablets Form [employer] Cross [place] Research officer, political party Form (CB) Original Claim Info 75 DRUG REQUIRES PRIOR AUTHORIZATION

## 2020-12-10 NOTE — Telephone Encounter (Signed)
Pt is requesting a call back regarding a "procedure" she needs to have done.

## 2020-12-14 ENCOUNTER — Telehealth: Payer: Self-pay

## 2020-12-14 MED ORDER — HYDROCODONE-ACETAMINOPHEN 5-325 MG PO TABS: 1.0000 | ORAL_TABLET | Freq: Four times a day (QID) | ORAL | 0 refills | Status: DC | PRN

## 2020-12-14 NOTE — Telephone Encounter (Signed)
Please advise 

## 2020-12-14 NOTE — Telephone Encounter (Signed)
Sent!

## 2020-12-14 NOTE — Addendum Note (Signed)
Addended by: Hortencia Pilar on: 12/14/2020 01:35 PM   Modules accepted: Orders

## 2020-12-14 NOTE — Telephone Encounter (Signed)
Patient called she stated her muscles and joints are hurting her she is requesting rx to be sent into the pharmacy she stated she has taken hydrocodine in the past and the medication slightly effects her IBS but she can tolerate it call [phone]

## 2020-12-14 NOTE — Telephone Encounter (Signed)
I called and advised the patient the hydrocodone Rx has been sent in to her pharmacy.

## 2020-12-16 ENCOUNTER — Telehealth: Payer: Self-pay | Admitting: Orthopaedic Surgery

## 2020-12-16 ENCOUNTER — Other Ambulatory Visit: Payer: Self-pay

## 2020-12-16 DIAGNOSIS — Z96612 Presence of left artificial shoulder joint: Secondary | ICD-10-CM

## 2020-12-16 DIAGNOSIS — Z96611 Presence of right artificial shoulder joint: Secondary | ICD-10-CM

## 2020-12-16 NOTE — Telephone Encounter (Signed)
Ordered

## 2020-12-16 NOTE — Telephone Encounter (Signed)
Put had a biopsy in right arm pit done. She wants to know if she can get her MRI asap. She is hurting bad in both shoulders. She said it will probably have to be pre approved by [facility].

## 2020-12-27 ENCOUNTER — Telehealth: Payer: Self-pay | Admitting: Family Medicine

## 2020-12-27 DIAGNOSIS — M542 Cervicalgia: Secondary | ICD-10-CM | POA: Diagnosis not present

## 2020-12-27 DIAGNOSIS — M546 Pain in thoracic spine: Secondary | ICD-10-CM | POA: Diagnosis not present

## 2020-12-27 DIAGNOSIS — M9901 Segmental and somatic dysfunction of cervical region: Secondary | ICD-10-CM | POA: Diagnosis not present

## 2020-12-27 DIAGNOSIS — M9902 Segmental and somatic dysfunction of thoracic region: Secondary | ICD-10-CM | POA: Diagnosis not present

## 2020-12-27 MED ORDER — HYDROCODONE-ACETAMINOPHEN 5-325 MG PO TABS: 1.0000 | ORAL_TABLET | Freq: Every evening | ORAL | 0 refills | Status: DC | PRN

## 2020-12-27 NOTE — Telephone Encounter (Signed)
I called and left a voice mail advising patient this was sent in to her pharmacy.

## 2020-12-27 NOTE — Telephone Encounter (Signed)
Please advise 

## 2020-12-27 NOTE — Telephone Encounter (Signed)
Sent!

## 2020-12-27 NOTE — Telephone Encounter (Signed)
Patient called requesting a refill of hydrocodone. Please send to pharmacy on file. Please call patient at [phone].

## 2020-12-28 ENCOUNTER — Other Ambulatory Visit: Payer: Self-pay

## 2020-12-28 ENCOUNTER — Telehealth: Payer: Self-pay

## 2020-12-28 ENCOUNTER — Telehealth: Payer: Self-pay | Admitting: Gastroenterology

## 2020-12-28 DIAGNOSIS — G8929 Other chronic pain: Secondary | ICD-10-CM

## 2020-12-28 NOTE — Telephone Encounter (Signed)
Pt called to inform Caryl Pina that she is still having knee issues.  She would like to know if she can have a MRI for both shoulders and her left knee.

## 2020-12-28 NOTE — Telephone Encounter (Signed)
No, her Josem Kaufmann covers that date

## 2020-12-28 NOTE — Telephone Encounter (Signed)
Spoke with pt and she is aware.

## 2020-12-28 NOTE — Telephone Encounter (Signed)
This seems to be a recurrent issue for her.  At this point certainly we can try to order MRIs of both shoulders for rotator cuff tears and a MRI of her left knee to assess the cartilage and rule out meniscal tearing.

## 2020-12-28 NOTE — Telephone Encounter (Signed)
I put in an order for the knee (I have already put in orders for the shoulders) Can we see if they can try and schedule them all together

## 2020-12-28 NOTE — Telephone Encounter (Signed)
Will this make a difference with the authorization for her CT scan.

## 2020-12-29 ENCOUNTER — Ambulatory Visit (HOSPITAL_COMMUNITY): Payer: BC Managed Care – PPO

## 2020-12-29 ENCOUNTER — Telehealth: Payer: Self-pay

## 2020-12-29 NOTE — Telephone Encounter (Signed)
Imaging will try to get pt in for all MRI's

## 2020-12-29 NOTE — Telephone Encounter (Signed)
States she wants them at [facility], can we change that

## 2020-12-29 NOTE — Telephone Encounter (Signed)
Pt called and would like to speak with Caryl Pina.  She said she didn't want relay a message to me so things didn't get mixed up.  She would like a call back by the end of the day.

## 2020-12-30 ENCOUNTER — Telehealth: Payer: Self-pay | Admitting: Family Medicine

## 2020-12-30 NOTE — Telephone Encounter (Signed)
Please advise 

## 2020-12-30 NOTE — Telephone Encounter (Signed)
Pt called asking that Dr. Junius Roads change her lipitor and meloxicam rx's to be for 90 days, and she would like this updated now so when it is time for her to get a refill it will already be done. Pt would like a CB when this has been updated.   [phone]

## 2020-12-31 ENCOUNTER — Telehealth: Payer: Self-pay | Admitting: Gastroenterology

## 2020-12-31 MED ORDER — MELOXICAM 15 MG PO TABS: ORAL_TABLET | ORAL | 3 refills | Status: DC

## 2020-12-31 MED ORDER — ATORVASTATIN CALCIUM 10 MG PO TABS: 5.0000 mg | ORAL_TABLET | Freq: Every day | ORAL | 1 refills | Status: DC

## 2020-12-31 NOTE — Telephone Encounter (Signed)
Rxs sent

## 2020-12-31 NOTE — Telephone Encounter (Signed)
I called and advised the patient. 

## 2020-12-31 NOTE — Telephone Encounter (Signed)
Spoke with pt and let her know again that clinpiq and sutab are not used for CT procedures. Pt wanted to know if she could pick up contrast to drink part of it at home. Discussed with her she is supposed to drink there in radiology. Pt given the phone number for rad scheduling to clarify with them.

## 2021-01-03 ENCOUNTER — Telehealth: Payer: Self-pay | Admitting: Family Medicine

## 2021-01-03 ENCOUNTER — Telehealth: Payer: Self-pay

## 2021-01-03 DIAGNOSIS — R911 Solitary pulmonary nodule: Secondary | ICD-10-CM

## 2021-01-03 MED ORDER — HYDROCODONE-ACETAMINOPHEN 5-325 MG PO TABS: 1.0000 | ORAL_TABLET | Freq: Two times a day (BID) | ORAL | 0 refills | Status: DC | PRN

## 2021-01-03 MED ORDER — HYDROCODONE-ACETAMINOPHEN 5-325 MG PO TABS: 1.0000 | ORAL_TABLET | Freq: Every evening | ORAL | 0 refills | Status: DC | PRN

## 2021-01-03 NOTE — Telephone Encounter (Signed)
The patient is aware. See other message regarding this hydrocodone Rx from today.

## 2021-01-03 NOTE — Telephone Encounter (Signed)
I called and advised the patient of the new directions -- must take this sparingly - she voiced understanding.   She mentioned that she is supposed to have the PET scan repeated 6 months after the first one, which was on 07/09/20 - to recheck the lung nodule. She is anxious to have this done - would like it at [facility].

## 2021-01-03 NOTE — Telephone Encounter (Signed)
Rx sent 

## 2021-01-03 NOTE — Telephone Encounter (Signed)
Patient called she is requesting rx refill for hydrocodone she is also requesting more than 15 tablets sent to pharmacy in chart call [phone]

## 2021-01-03 NOTE — Telephone Encounter (Signed)
The patient has been taking 1/2 of a hydrocodone every 4-6 hours, so as to control her pain and yet be able to function. A whole tablet knocks her out, so she takes 1 whole tablet at bedtime. The pharmacy will not fill the Rx prescribed today, as it is too early according to the current directions on the Rx. She took her last 1/2 pill this morning. She does still take the meloxicam daily, as well. Please advise.

## 2021-01-03 NOTE — Telephone Encounter (Signed)
I have changed the dosing instructions so she can fill it sooner, but I want her to use it very sparingly.

## 2021-01-03 NOTE — Telephone Encounter (Signed)
Patient called back advised [employer] denied her Rx because the Rx need to be written a certain way by Dr Junius Roads according to [facility]. Patient said she is completely out of her medication. Patient said she need to be able to take the medication as needed for pain. Patient said right now she is taking .25 mg of Oxycodone. Patient asked if Terri to call her if she had any questions. The number to contact patient is [phone]

## 2021-01-03 NOTE — Telephone Encounter (Signed)
Please advise. She has multiple MRIs coming up next week, per Dr. Ninfa Linden.

## 2021-01-04 ENCOUNTER — Other Ambulatory Visit: Payer: Self-pay

## 2021-01-04 ENCOUNTER — Encounter (HOSPITAL_COMMUNITY): Payer: Self-pay

## 2021-01-04 ENCOUNTER — Ambulatory Visit (HOSPITAL_COMMUNITY)
Admission: RE | Admit: 2021-01-04 | Discharge: 2021-01-04 | Disposition: A | Discharge status: Home / Self Care | Payer: BC Managed Care – PPO | Source: Ambulatory Visit | Attending: Gastroenterology | Admitting: Gastroenterology

## 2021-01-04 DIAGNOSIS — D509 Iron deficiency anemia, unspecified: Secondary | ICD-10-CM

## 2021-01-04 DIAGNOSIS — R634 Abnormal weight loss: Secondary | ICD-10-CM

## 2021-01-04 DIAGNOSIS — R109 Unspecified abdominal pain: Secondary | ICD-10-CM | POA: Diagnosis not present

## 2021-01-04 LAB — POCT I-STAT CREATININE: Creatinine, Ser: 0.6 mg/dL (ref 0.44–1.00)

## 2021-01-04 MED ORDER — BARIUM SULFATE 0.1 % PO SUSP
ORAL | Status: AC
  Filled 2021-01-04: qty 3

## 2021-01-04 MED ORDER — IOHEXOL 300 MG/ML  SOLN
75.0000 mL | Freq: Once | INTRAMUSCULAR | Status: AC | PRN
  Administered 2021-01-04: 75 mL via INTRAVENOUS

## 2021-01-04 NOTE — Telephone Encounter (Signed)
I called and left voice mail that chest CT has been ordered.

## 2021-01-04 NOTE — Telephone Encounter (Signed)
Chest CT ordered (this is what Dr. Servando Snare recommended, not a PET scan).

## 2021-01-04 NOTE — Addendum Note (Signed)
Addended by: Hortencia Pilar on: 01/04/2021 08:14 AM   Modules accepted: Orders

## 2021-01-05 ENCOUNTER — Telehealth: Payer: Self-pay | Admitting: Orthopaedic Surgery

## 2021-01-05 ENCOUNTER — Ambulatory Visit (HOSPITAL_COMMUNITY): Payer: BC Managed Care – PPO

## 2021-01-05 DIAGNOSIS — G8929 Other chronic pain: Secondary | ICD-10-CM

## 2021-01-05 NOTE — Telephone Encounter (Signed)
I really have no idea at all.  This patient seems to have a lot of musculoskeletal complaints.  If she wants MRIs of everything we should just MRI what ever she wants.

## 2021-01-05 NOTE — Telephone Encounter (Signed)
Please advise 

## 2021-01-05 NOTE — Addendum Note (Signed)
Addended by: Robyne Peers on: 01/05/2021 03:05 PM   Modules accepted: Orders

## 2021-01-05 NOTE — Telephone Encounter (Signed)
Pt calls and states that she is suppose to be having an MRI done but she says she thought she was suppose to be having both knees done? I informed her that she is having both shoulders and one knee done but she is wondering why is she not getting an MRI of the other? CB [phone]

## 2021-01-05 NOTE — Telephone Encounter (Signed)
I added the other knee. Can this be scheduled with the others?

## 2021-01-07 ENCOUNTER — Telehealth: Payer: Self-pay

## 2021-01-07 MED ORDER — PREDNISONE 10 MG PO TABS: ORAL_TABLET | ORAL | 0 refills | Status: DC

## 2021-01-07 NOTE — Telephone Encounter (Signed)
Please advise. She is currently on meloxicam (and hydrocodone).

## 2021-01-07 NOTE — Telephone Encounter (Signed)
Rx sent. Stop meloxicam while on prednisone.

## 2021-01-07 NOTE — Telephone Encounter (Signed)
Patient called she stated she is in severe pain she is requesting rx for prednisone so she can get through the weekend call [phone]

## 2021-01-07 NOTE — Telephone Encounter (Signed)
I called and advised the patient. She voiced understanding.

## 2021-01-07 NOTE — Addendum Note (Signed)
Addended by: Hortencia Pilar on: 01/07/2021 10:29 AM   Modules accepted: Orders

## 2021-01-10 ENCOUNTER — Ambulatory Visit (HOSPITAL_COMMUNITY)
Admission: RE | Admit: 2021-01-10 | Discharge: 2021-01-10 | Disposition: A | Discharge status: Home / Self Care | Payer: BC Managed Care – PPO | Source: Ambulatory Visit | Attending: Orthopaedic Surgery | Admitting: Orthopaedic Surgery

## 2021-01-10 ENCOUNTER — Other Ambulatory Visit: Payer: Self-pay

## 2021-01-10 DIAGNOSIS — Z96612 Presence of left artificial shoulder joint: Secondary | ICD-10-CM

## 2021-01-10 DIAGNOSIS — M25562 Pain in left knee: Secondary | ICD-10-CM

## 2021-01-10 DIAGNOSIS — M75121 Complete rotator cuff tear or rupture of right shoulder, not specified as traumatic: Secondary | ICD-10-CM | POA: Diagnosis not present

## 2021-01-10 DIAGNOSIS — G8929 Other chronic pain: Secondary | ICD-10-CM | POA: Insufficient documentation

## 2021-01-10 DIAGNOSIS — M1711 Unilateral primary osteoarthritis, right knee: Secondary | ICD-10-CM | POA: Diagnosis not present

## 2021-01-10 DIAGNOSIS — M75122 Complete rotator cuff tear or rupture of left shoulder, not specified as traumatic: Secondary | ICD-10-CM | POA: Diagnosis not present

## 2021-01-10 DIAGNOSIS — Z96611 Presence of right artificial shoulder joint: Secondary | ICD-10-CM | POA: Insufficient documentation

## 2021-01-10 DIAGNOSIS — M25561 Pain in right knee: Secondary | ICD-10-CM | POA: Diagnosis not present

## 2021-01-10 DIAGNOSIS — M1712 Unilateral primary osteoarthritis, left knee: Secondary | ICD-10-CM | POA: Diagnosis not present

## 2021-01-12 ENCOUNTER — Other Ambulatory Visit: Payer: Self-pay

## 2021-01-12 ENCOUNTER — Ambulatory Visit (HOSPITAL_COMMUNITY)
Admission: RE | Admit: 2021-01-12 | Discharge: 2021-01-12 | Disposition: A | Discharge status: Home / Self Care | Payer: BC Managed Care – PPO | Source: Ambulatory Visit | Attending: Family Medicine | Admitting: Family Medicine

## 2021-01-12 DIAGNOSIS — M47819 Spondylosis without myelopathy or radiculopathy, site unspecified: Secondary | ICD-10-CM | POA: Diagnosis not present

## 2021-01-12 DIAGNOSIS — I700 Atherosclerosis of aorta: Secondary | ICD-10-CM | POA: Diagnosis not present

## 2021-01-12 DIAGNOSIS — R911 Solitary pulmonary nodule: Secondary | ICD-10-CM | POA: Diagnosis not present

## 2021-01-12 DIAGNOSIS — J432 Centrilobular emphysema: Secondary | ICD-10-CM | POA: Diagnosis not present

## 2021-01-12 DIAGNOSIS — I2510 Atherosclerotic heart disease of native coronary artery without angina pectoris: Secondary | ICD-10-CM | POA: Diagnosis not present

## 2021-01-13 ENCOUNTER — Ambulatory Visit (INDEPENDENT_AMBULATORY_CARE_PROVIDER_SITE_OTHER): Payer: BC Managed Care – PPO | Admitting: Orthopaedic Surgery

## 2021-01-13 ENCOUNTER — Encounter: Payer: Self-pay | Admitting: Orthopaedic Surgery

## 2021-01-13 DIAGNOSIS — M25561 Pain in right knee: Secondary | ICD-10-CM | POA: Diagnosis not present

## 2021-01-13 DIAGNOSIS — M25562 Pain in left knee: Secondary | ICD-10-CM

## 2021-01-13 DIAGNOSIS — G8929 Other chronic pain: Secondary | ICD-10-CM

## 2021-01-13 DIAGNOSIS — M25512 Pain in left shoulder: Secondary | ICD-10-CM | POA: Diagnosis not present

## 2021-01-13 DIAGNOSIS — M25511 Pain in right shoulder: Secondary | ICD-10-CM

## 2021-01-13 NOTE — Progress Notes (Signed)
The patient is a regular patient of Dr. Junius Roads that I have seen several times in the past.  She has dealt with chronic pain in her shoulders and chronic pain in her knees.  She is very thin and cachectic.  She is only 65 years old and weighs under 80 pounds.  We obtained MRIs of both her shoulders per her request in both knees and she is here for review of these today.  She does take meloxicam as well as Neurontin and does take half of a hydrocodone once to twice a day.  The MRIs of both knees were reviewed to her.  It does show mild to moderate arthritis in her knees but no evidence of ligamentous or tendinous injury.  The meniscus on both knees are intact.  The MRIs of both shoulders show severe tendinitis of the rotator cuff.  There is some slight arthropathy of the [facility] joint of both shoulders.  The glenohumeral joints on both shoulders are well-maintained.  There are some partial tearing of the rotator cuff of both shoulders.  Examination of both shoulder show that she is very thin and cachectic in her shoulders and arms are very small.  She has pretty good range of motion of both shoulders with her painful past 90 degrees of abduction bilaterally.  It is hard to tell if there is true weakness in the rotator cuff.  I am afraid of hurting her arms as I examined them due to her being so frail.  Examination of both knees show no effusion.  Both knees have excellent range of motion and are ligamentously stable.  I told her this is a difficult situation given how tiny she is in terms of her frailty.  I would like to send her to my partner Dr. Marlou Sa for further evaluation and treatment of the shoulders given her small stature and frailty.  I would be concerned from my standpoint that I can successfully get scopes around the shoulder of the standard scopes and this may just be from my limited experience in operating on someone so frail and small.  We will make that referral for her.

## 2021-01-14 ENCOUNTER — Other Ambulatory Visit: Payer: Self-pay | Admitting: Family Medicine

## 2021-01-17 ENCOUNTER — Telehealth: Payer: Self-pay | Admitting: Family Medicine

## 2021-01-17 ENCOUNTER — Other Ambulatory Visit: Payer: Self-pay | Admitting: Family Medicine

## 2021-01-17 ENCOUNTER — Ambulatory Visit: Payer: Self-pay | Admitting: Podiatry

## 2021-01-17 MED ORDER — GABAPENTIN 100 MG PO CAPS: ORAL_CAPSULE | ORAL | 3 refills | Status: DC

## 2021-01-17 NOTE — Telephone Encounter (Signed)
Nodules are small and have not changed.  No further testing needed, per radiology.

## 2021-01-17 NOTE — Telephone Encounter (Signed)
Tried calling the patient - no answer and her mailbox was full.

## 2021-01-18 ENCOUNTER — Telehealth: Payer: Self-pay

## 2021-01-18 MED ORDER — NABUMETONE 500 MG PO TABS: 500.0000 mg | ORAL_TABLET | Freq: Two times a day (BID) | ORAL | 3 refills | Status: DC | PRN

## 2021-01-18 NOTE — Telephone Encounter (Signed)
Patient called she is is requesting a higher dosage for meloxicam she is requesting a dosage around 22 MG she stated she is only taking rx when she is having a flare up in her shoulder, patient also stated her toes and feet are numb call [phone]

## 2021-01-18 NOTE — Addendum Note (Signed)
Addended by: Hortencia Pilar on: 01/18/2021 10:04 AM   Modules accepted: Orders

## 2021-01-18 NOTE — Telephone Encounter (Signed)
I called and advised the patient. She understands to not take the meloxicam and nabumetone together - take one or the other, and not more than is prescribed.

## 2021-01-18 NOTE — Telephone Encounter (Signed)
I called and advised the patient. She has an appointment next Tuesday with Dr. Junius Roads for follow up on her lipids/blood work. Any questions on this can be addressed then.

## 2021-01-18 NOTE — Telephone Encounter (Signed)
15 mg is the max dosage for meloxicam.    Will call in relafen to try instead.  Needs to get in to see her rheumatologist again.

## 2021-01-21 ENCOUNTER — Ambulatory Visit: Payer: BC Managed Care – PPO | Admitting: Family Medicine

## 2021-01-24 ENCOUNTER — Telehealth: Payer: Self-pay | Admitting: Family Medicine

## 2021-01-24 ENCOUNTER — Ambulatory Visit (INDEPENDENT_AMBULATORY_CARE_PROVIDER_SITE_OTHER): Payer: BC Managed Care – PPO | Admitting: Gastroenterology

## 2021-01-24 ENCOUNTER — Encounter: Payer: Self-pay | Admitting: Gastroenterology

## 2021-01-24 VITALS — BP 104/66 | HR 112 | Ht 60.0 in | Wt 71.4 lb

## 2021-01-24 DIAGNOSIS — E43 Unspecified severe protein-calorie malnutrition: Secondary | ICD-10-CM

## 2021-01-24 DIAGNOSIS — R109 Unspecified abdominal pain: Secondary | ICD-10-CM

## 2021-01-24 MED ORDER — PANTOPRAZOLE SODIUM 40 MG PO TBEC: 40.0000 mg | DELAYED_RELEASE_TABLET | Freq: Two times a day (BID) | ORAL | 3 refills | Status: DC

## 2021-01-24 MED ORDER — MELOXICAM 15 MG PO TABS: 7.5000 mg | ORAL_TABLET | Freq: Every day | ORAL | 6 refills | Status: DC | PRN

## 2021-01-24 NOTE — Progress Notes (Signed)
Referring Provider: Eunice Blase, MD Primary Care Physician:  Eunice Blase, MD  Chief complaint:  Unexplained weight loss   IMPRESSION:  Unexplained weight loss despite EGD, colonoscopy, CTAP, and CTE Constipation-prdominent IBS x 30 years, with change in symptoms 10 years ago Abnormal PET scan 97/3/53 showing metabolic uptake at TI/ICV valve    - no discrete mass on CT abd/pelvis    - no mass on colonoscopy to the cecum (TI was not intubated) Elevated IgA level Iron deficiency anemia following by Dr. Delton Coombes  PLAN: - Continue pantoprazole 40 mg BID - Virtual capsule endoscopy to further evaluate her weight loss and iron deficiency to exclude Crohns, malignancy - Consider empiric trial of Xifaxan if capsule endoscopy is negative - Low threshold for referral to tertiary care center for second opinion   HPI: Vanessa Lara is a 65 y.o. female who returns in follow-up. This is my first office visit with [name]. Her friend, Vicente Serene, accompanies her today. He notes that some days are better than others.   She has depression,rheumatoid arthritisfollowed by rheumatologist at [facility] and Dr. Kathlene November in [place] secondary to Simponi, osteoarthritis, bursitis, tendonitis and questionablecollagen vascular disorder,lumbar spondylosis,chroniciron deficiencyanemiarequiringIV iron and IBS symptoms.    Initially seen in consultation 08/02/2020 for further evaluation regarding an abnormal PET scan 07/09/2020 which showed metabolic activity at the TI/IC valve area. A colonoscopy was completed by Dr. Silverio Decamp on 08/12/2020 which showed a normal cecum and IC valve, diverticulosis to the sigmoid colon, and nonbleeding internal and external hemorrhoids. She continued to have IBS symptoms and unintentional weight loss.   Labs 08/23/20: normal CMP except for alk phos 148, normal CBC except for WBC 11.1 Labs 10/07/20: Iron 18, ferritin 281, iron saturation 10, Hgb 11.5, platelets 462  Records  from Dr. Barry Dienes 10/25/2020 who for follow-up after lymph node biopsy that showed only reactive lymph nodes but documents ongoing weight loss  EGD 11/26/20 showed a slightly ringed appearing esophagus and gastritis.  Duodenal biopsies were normal.  Gastric biopsies confirmed mild chronic gastritis.  There was no H. pylori.  Esophageal biopsies showed reflux with focal erosion.  There was no eosinophilic esophagitis.  CT Enterography 01/04/21: no abnormality except for right renal cysts and aortic atherosclerosis CTchest without contrast 01/12/21: stable pulmonary micronodules, aortic atherosclerosis  Returns today in scheduled follow-up. Weight is down 3 pounds since her last visit here.  Recent constipation. Some flare in symptoms of IBS this week. She reports having a bowel movement once every 1 to 2 weeks. Her stool is either soft, formed or loose like mud. She is intolerant to fiber, stool softeners and laxatives which results in stomach pain and bloat.    Past Medical History:  Diagnosis Date  . Anemia   . Anxiety   . Arthritis    RA  . Cancer (HCC)    Skin  . CHF (congestive heart failure) ([place])   . Collagen vascular disease ([place])   . Depression   . Endometriosis   . High cholesterol   . IBS (irritable bowel syndrome)   . Lymphadenopathy   . Mitral valve prolapse   . Uterus, adenomyosis     Past Surgical History:  Procedure Laterality Date  . AXILLARY LYMPH NODE BIOPSY Right 10/12/2020   Procedure: RIGHT AXILLARY LYMPH NODE BIOPSY EXCISION;  Surgeon: Stark Klein, MD;  Location: [facility];  Service: General;  Laterality: Right;  . CARPAL TUNNEL RELEASE Right   . COLONOSCOPY    . COLONOSCOPY N/A 12/13/2015  Procedure: COLONOSCOPY;  Surgeon: Rogene Houston, MD;  [facility];  Service: Endoscopy;  Laterality: N/A;  9:55  . Fatty tumor     2011 (left arm)  . TONSILLECTOMY    . TOTAL ABDOMINAL HYSTERECTOMY     2003    Current Outpatient  Medications  Medication Sig Dispense Refill  . atorvastatin (LIPITOR) 10 MG tablet Take 0.5 tablets (5 mg total) by mouth daily. 90 tablet 1  . cyclobenzaprine (FLEXERIL) 10 MG tablet Take 1 tablet (10 mg total) by mouth 3 (three) times daily as needed for muscle spasms. 90 tablet 1  . dicyclomine (BENTYL) 10 MG capsule Take 10 mg by mouth daily as needed.    . diphenoxylate-atropine (LOMOTIL) 2.5-0.025 MG tablet Take 1-2 tablets by mouth 4 (four) times daily as needed for diarrhea or loose stools. 30 tablet 0  . gabapentin (NEURONTIN) 100 MG capsule TAKE 1 CAPSULE BY MOUTH AT BEDTIME. MAY INCREASE TO 1 BY MOUTH 3 TIMES DAILY AS NEEDED (Patient taking differently: daily as needed. TAKE 1 CAPSULE BY MOUTH AT BEDTIME. MAY INCREASE TO 1 BY MOUTH 3 TIMES DAILY AS NEEDED) 90 capsule 3  . Heat Wraps (THERMACARE BACK/HIP) MISC 1 application by Does not apply route daily. 3 each 11  . Hydroactive Dressings ([employer] HYDROCOLLOID PADS) PADS Apply 1 application topically daily. 6 each 6  . HYDROcodone-acetaminophen (NORCO/VICODIN) 5-325 MG tablet Take 1 tablet by mouth 2 (two) times daily as needed for moderate pain. 20 tablet 0  . LORazepam (ATIVAN) 0.5 MG tablet TAKE 1/2 TO 1 TABLET(0.25 TO 0.5 MG) BY MOUTH THREE TIMES DAILY AS NEEDED FOR ANXIETY 30 tablet 1  . meloxicam (MOBIC) 15 MG tablet Take 15 mg by mouth daily.    Marland Kitchen oxyCODONE (OXY IR/ROXICODONE) 5 MG immediate release tablet Take 1-2 tablets (5-10 mg total) by mouth every 6 (six) hours as needed for severe pain. 30 tablet 0  . pantoprazole (PROTONIX) 40 MG tablet TAKE 1 TABLET(40 MG) BY MOUTH TWICE DAILY (Patient taking differently: Patient takes 3x a day sometimes) 180 tablet 6  . PARoxetine (PAXIL) 10 MG tablet Take 10 mg by mouth daily.    . predniSONE (DELTASONE) 10 MG tablet Take as directed for 12 days.  Daily dose 6,6,5,5,4,4,3,3,2,2,1,1. (Patient taking differently: Take 0.25 mg by mouth daily.) 42 tablet 0  . traZODone (DESYREL) 50 MG tablet Take  50 mg by mouth at bedtime.    . furosemide (LASIX) 20 MG tablet Take 20 mg by mouth daily as needed.  (Patient not taking: Reported on 01/24/2021)     No current facility-administered medications for this visit.    Allergies as of 01/24/2021 - Review Complete 01/24/2021  Allergen Reaction Noted  . Codeine Other (See Comments) 06/17/2020  . Golimumab Other (See Comments) 05/27/2018  . Hydrocodone Other (See Comments) 06/17/2020  . Methotrexate derivatives Other (See Comments) 03/28/2018  . Prednisone Rash 11/05/2020      Physical Exam: General:   Alert, chronically ill appearing, pleasant and cooperative in NAD, cachectic Head:  Normocephalic and atraumatic. Eyes:  Sclera clear, no icterus.   Conjunctiva pink. Abdomen:  Soft, thin, scaphoid, nontender, nondistended, normal bowel sounds, no rebound or guarding. No hepatosplenomegaly.   Neurologic:  Alert and  oriented x4;  grossly nonfocal Skin:  Intact without significant lesions or rashes. Psych:  Alert and cooperative. Normal mood and affect.     Bryceton Hantz L. Tarri Glenn, MD, MPH 01/24/2021, 11:16 AM

## 2021-01-24 NOTE — Telephone Encounter (Signed)
Sent!

## 2021-01-24 NOTE — Telephone Encounter (Signed)
It is meloxicam that she needs a new Rx for - has been out x 5 days. The pharmacist would not let her get that, since she just picked up nabumetone - afraid to take that one. So, she is asking for a new Rx to be sent in. She did say she has an appointment coming up with the rheumatology PA either the end of this week or next week. Has follow up appointment here tomorrow morning.

## 2021-01-24 NOTE — Telephone Encounter (Signed)
I agree, she should start taking this again.  She needs to call her rheumatologist for the prescription (and to be monitored).  Can make new referral if needed.

## 2021-01-24 NOTE — Telephone Encounter (Signed)
Pt called stating she can't take the nabumetone because it has too many heart warnings and she is at high risk for heart complications; pt would like to have her methotrek rx called in she states this has been prescribed to her in the past.

## 2021-01-24 NOTE — Patient Instructions (Addendum)
It was a pleasure to see you today. Based on our discussion, I am providing you with my recommendations below:  RECOMMENDATION(S):   I will send in a refill for your Pantoprazole  I would like to better evaluate your symptoms with a Capsule Endoscopy. Please refer to your instructions below.  PRESCRIPTION MEDICATION(S):   We have sent the following medication(s) to your pharmacy:  . Pantoprazole - please take 40mg  by mouth 2 times daily  NOTE: If your medication(s) requires a PRIOR AUTHORIZATION, we will receive notification from your pharmacy. Once received, the process to submit for approval may take up to 7-10 business days. You will be contacted about any denials we have received from your insurance company as well as alternatives recommended by your provider.  CAPSULE ENDOSCOPY PATIENT INSTRUCTION SHEET  1. 01/25/21 Seven (7) days prior to capsule endoscopy stop taking iron supplements and carafate.  2. 01/30/21 Two (2) days prior to capsule endoscopy stop taking aspirin or any arthritis drugs.  3. 01/31/21 Day before capsule endoscopy purchase a 238 gram bottle of Miralax from the laxative section of your drug store, and a 32 oz. bottle of Gatorade (no red).    4. 01/31/21 One (1) day prior to capsule endoscopy: a) Stop smoking. b) Eat a regular diet until 12:00 Noon. c) After 12:00 Noon take only the following: Black coffee  Jell-O (no fruit or red Jell-o) Water   Bouillon (chicken or beef) 7-Up   Cranberry Juice Tea   Kool-Aid Popsicle (not red) Sprite   Coke Ginger Ale  Pepsi Mountain Dew Gatorade d) At 6:00 pm the evening before your appointment, drink 7 capfuls (105 grams) of Miralax with 32 oz. Gatorade. Drink 8 oz every 15 minutes until gone. e) Nothing to eat or drink after midnight except medications with a sip of water.  5. 02/01/21 Day of capsule endoscopy:  No medications for 2 hours prior to your test.  6. Please arrive at [facility]  3rd floor patient  registration area by 8:30 am on: 02/01/21.   For any questions: Call [facility] at [phone] and ask to speak with one of the capsule endoscopy nurses.  YOU WILL NEED TO RETURN THE EQUIPMENT AT 4 PM ON THE DAY OF THE PROCEDURE.  PLEASE KEEP THIS IN MIND WHEN SCHEDULING.   Small Bowel Capsule Endoscopy  What you should know: Small Bowel capsule endoscopy is a procedure that takes pictures of the inside of your small intestine (bowel).  Your small bowel connects to your stomach on one end, and your large bowel (colon) on the other.  A capsule endoscopy is done by swallowing a pill size camera.  The capsule moves through your stomach and into your small bowel, where pictures are taken.   You may need a small bowel capsule endoscopy if you have symptoms, such as blood in your stool, chronic stomach pain, and diarrhea.  The pictures may show if you have growths, swelling, and bleeding area in you small bowel.  A capsule endoscopy may also show if diseases such as Crohn's or celiac disease are causing your symptoms.  Having a small bowel capsule endoscopy may help you and your caregiver learn the cause of your symptoms.  Learning what is causing your symptoms allows you to receive needed treatment and prevent further problems.  Risks: . You may have stomach pain during your procedure.   . The pictures taken by the capsule may not be clear.   . The pictures may not  show the cause of your symptoms.   . You may need another endoscopy procedure.  .  The capsule may get trapped in your esophagus or intestines. You may need surgery or additional procedures to remove the capsule from your body.  Before your procedure: You will be instructed to stop certain prescription medications or over- the -counter medications prior to the procedure.   The day before your scheduled appointment you will need to be on a restricted diet and will need to drink a bowel prep that will clean out your bowels.    The day  of the procedure: . You may drive yourself to the procedure.   . You will need to plan on 2 trips to the office on the day of the procedure.  Morning: . Plan to be at the office about 45 minutes. . The morning of the procedure a sensor belt and recorder will be placed on you.  You will wear this for 8 hours.  (The sensor belt transfers pictures of your small bowel to the recorder.)   You will be given a pill-sized capsule endoscope to swallow.  Once you swallow the capsule it will travel through your body the same way food does, constantly taking pictures along the way.  The capsule takes 2-3 pictures a second.   . Once you have left the office you may go about your normal day with a few exceptions: You may not go near a MRI machine or a radio or television towers; You need to avoid other patients having capsule endoscopy; You will be given a written diet to follow for the day.  Afternoon: . You will need to be return to the office at your designated time. . The sensors belt will be removed . You will need to be at the office about 15 minutes.  FOLLOW UP:  . I will let you when to follow up with me after I have reviewed your results.  BMI:  . If you are age 74 or younger, your body mass index should be between 19-25. Your There is no height or weight on file to calculate BMI. If this is out of the aformentioned range listed, please consider follow up with your Primary Care Provider.   Thank you for trusting me with your gastrointestinal care!    [place]ton [place], MD, MPH

## 2021-01-25 ENCOUNTER — Other Ambulatory Visit: Payer: Self-pay

## 2021-01-25 ENCOUNTER — Other Ambulatory Visit: Payer: Self-pay | Admitting: Family Medicine

## 2021-01-25 ENCOUNTER — Ambulatory Visit (INDEPENDENT_AMBULATORY_CARE_PROVIDER_SITE_OTHER): Payer: BC Managed Care – PPO | Admitting: Family Medicine

## 2021-01-25 ENCOUNTER — Encounter: Payer: Self-pay | Admitting: Family Medicine

## 2021-01-25 ENCOUNTER — Telehealth: Payer: Self-pay | Admitting: Family Medicine

## 2021-01-25 VITALS — BP 117/78 | HR 114 | Ht 60.0 in | Wt 72.8 lb

## 2021-01-25 DIAGNOSIS — E785 Hyperlipidemia, unspecified: Secondary | ICD-10-CM | POA: Diagnosis not present

## 2021-01-25 DIAGNOSIS — M25512 Pain in left shoulder: Secondary | ICD-10-CM

## 2021-01-25 DIAGNOSIS — F5102 Adjustment insomnia: Secondary | ICD-10-CM | POA: Diagnosis not present

## 2021-01-25 DIAGNOSIS — E611 Iron deficiency: Secondary | ICD-10-CM

## 2021-01-25 DIAGNOSIS — M25511 Pain in right shoulder: Secondary | ICD-10-CM

## 2021-01-25 DIAGNOSIS — G8929 Other chronic pain: Secondary | ICD-10-CM

## 2021-01-25 DIAGNOSIS — M1712 Unilateral primary osteoarthritis, left knee: Secondary | ICD-10-CM

## 2021-01-25 LAB — LIPID PANEL
Cholesterol: 179 mg/dL (ref <200)
HDL: 52 mg/dL (ref >=50)
LDL Cholesterol (Calc): 105 mg/dL (calc) — ABNORMAL HIGH
Non-HDL Cholesterol (Calc): 127 mg/dL (calc) (ref <130)
Total CHOL/HDL Ratio: 3.4 (calc) (ref <5.0)
Triglycerides: 128 mg/dL (ref <150)

## 2021-01-25 LAB — CBC WITH DIFFERENTIAL/PLATELET
Absolute Monocytes: 581 cells/uL (ref 200–950)
Basophils Absolute: 66 cells/uL (ref 0–200)
Basophils Relative: 0.5 %
Eosinophils Absolute: 106 cells/uL (ref 15–500)
Eosinophils Relative: 0.8 %
HCT: 35 % (ref 35.0–45.0)
Hemoglobin: 11.1 g/dL — ABNORMAL LOW (ref 11.7–15.5)
Lymphs Abs: 2165 cells/uL (ref 850–3900)
MCH: 27.9 pg (ref 27.0–33.0)
MCHC: 31.7 g/dL — ABNORMAL LOW (ref 32.0–36.0)
MCV: 87.9 fL (ref 80.0–100.0)
MPV: 9.3 fL (ref 7.5–12.5)
Monocytes Relative: 4.4 %
Neutro Abs: 10283 cells/uL — ABNORMAL HIGH (ref 1500–7800)
Neutrophils Relative %: 77.9 %
Platelets: 557 10*3/uL — ABNORMAL HIGH (ref 140–400)
RBC: 3.98 10*6/uL (ref 3.80–5.10)
RDW: 14.6 % (ref 11.0–15.0)
Total Lymphocyte: 16.4 %
WBC: 13.2 10*3/uL — ABNORMAL HIGH (ref 3.8–10.8)

## 2021-01-25 LAB — BASIC METABOLIC PANEL
BUN: 9 mg/dL (ref 7–25)
CO2: 29 mmol/L (ref 20–32)
Calcium: 9.1 mg/dL (ref 8.6–10.4)
Chloride: 99 mmol/L (ref 98–110)
Creat: 0.61 mg/dL (ref 0.50–0.99)
Glucose, Bld: 92 mg/dL (ref 65–99)
Potassium: 4 mmol/L (ref 3.5–5.3)
Sodium: 137 mmol/L (ref 135–146)

## 2021-01-25 LAB — HEPATIC FUNCTION PANEL
AG Ratio: 1.1 (calc) (ref 1.0–2.5)
ALT: 12 U/L (ref 6–29)
AST: 15 U/L (ref 10–35)
Albumin: 3.8 g/dL (ref 3.6–5.1)
Alkaline phosphatase (APISO): 146 U/L (ref 37–153)
Bilirubin, Direct: 0.1 mg/dL (ref 0.0–0.2)
Globulin: 3.5 g/dL (calc) (ref 1.9–3.7)
Indirect Bilirubin: 0.2 mg/dL (calc) (ref 0.2–1.2)
Total Bilirubin: 0.3 mg/dL (ref 0.2–1.2)
Total Protein: 7.3 g/dL (ref 6.1–8.1)

## 2021-01-25 LAB — IRON,TIBC AND FERRITIN PANEL
%SAT: 10 % (calc) — ABNORMAL LOW (ref 16–45)
Ferritin: 132 ng/mL (ref 16–288)
Iron: 25 ug/dL — ABNORMAL LOW (ref 45–160)
TIBC: 246 mcg/dL (calc) — ABNORMAL LOW (ref 250–450)

## 2021-01-25 MED ORDER — TRAZODONE HCL 100 MG PO TABS: 100.0000 mg | ORAL_TABLET | Freq: Every evening | ORAL | 6 refills | Status: AC | PRN

## 2021-01-25 NOTE — Telephone Encounter (Signed)
Noted  

## 2021-01-25 NOTE — Progress Notes (Signed)
Office Visit Note   Patient: Vanessa Lara           Date of Birth: Apr 14, 1956           MRN: [id number] Visit Date: 01/25/2021 Requested by: Eunice Blase, MD [address] [place],  [place] [address] PCP: Eunice Blase, MD  Subjective: Chief Complaint  Patient presents with  . Other    Medication check. Requests Trazodone 100 mg - - not sleeping well (currently on 50 mg hs). Lipid f/u - on atorvastatin 1/2 daily.    HPI: She is here with a few concerns.  She is due for monitoring of hyperlipidemia after getting back on Lipitor.  She is having trouble sleeping and would like to increase her trazodone from 50mg  to 100 mg at bedtime.  Both of her shoulders are bothering her again and she would like injections.  Her left knee is also hurting again and she is interested in injections for that.  She has a history of iron deficiency and would like to have her levels rechecked.                ROS:   All other systems were reviewed and are negative.  Objective: Vital Signs: BP 117/78 (Cuff Size: Small)   Pulse (!) 114   Ht 5' (1.524 m)   Wt 72 lb 12.8 oz (33 kg)   BMI 14.22 kg/m   Physical Exam:  General:  Alert and oriented, in no acute distress. Pulm:  Breathing unlabored. Psy:  Normal mood, congruent affect.  Shoulders: Full range of motion bilaterally, pain reaching overhead.  Imaging: No results found.  Assessment & Plan: 1.  Bilateral shoulder impingement -Subacromial injections today.  Follow-up as needed.  Left knee osteoarthritis - Requesting approval for gel injections.  3.  Hyperlipidemia -Recheck today.  4.  History of iron deficiency - Recheck levels today.  5.  Insomnia - Increase trazodone to 100 mg at bedtime.     Procedures: Bilateral shoulder injections: After sterile prep with Betadine, injected 3 cc 0.25% bupivacaine and 40 mg Depo-Medrol from posterior approach into the subacromial space.       PMFS History: Patient Active  Problem List   Diagnosis Date Noted  . Osteoporosis 06/15/2020  . Iron deficiency anemia 04/09/2020  . Primary osteoarthritis involving multiple joints 12/31/2018  . Rheumatoid arthritis involving multiple sites with positive rheumatoid factor ([place]) 12/31/2018  . Irritable bowel syndrome with both constipation and diarrhea 10/28/2018  . Constipation 03/14/2016  . Mitral valve prolapse 11/23/2015  . High cholesterol 11/23/2015   Past Medical History:  Diagnosis Date  . Anemia   . Anxiety   . Arthritis    RA  . Cancer (HCC)    Skin  . CHF (congestive heart failure) ([place])   . Collagen vascular disease ([place])   . Depression   . Endometriosis   . High cholesterol   . IBS (irritable bowel syndrome)   . Lymphadenopathy   . Mitral valve prolapse   . RA (rheumatoid arthritis) ([place])   . Rotator cuff tear    bilateral  . Uterus, adenomyosis     Family History  Problem Relation Age of Onset  . Colon cancer Other   . Bone cancer Mother   . Brain cancer Father   . Brain cancer Sister   . Breast cancer Sister   . Renal Disease Sister   . Pancreatic cancer Neg Hx   . Esophageal cancer Neg Hx   .  Stomach cancer Neg Hx   . Rectal cancer Neg Hx     Past Surgical History:  Procedure Laterality Date  . AXILLARY LYMPH NODE BIOPSY Right 10/12/2020   Procedure: RIGHT AXILLARY LYMPH NODE BIOPSY EXCISION;  Surgeon: Stark Klein, MD;  Location: [facility];  Service: General;  Laterality: Right;  . BUNIONECTOMY Right   . CARPAL TUNNEL RELEASE Right   . COLONOSCOPY    . COLONOSCOPY N/A 12/13/2015   Procedure: COLONOSCOPY;  Surgeon: Rogene Houston, MD;  [facility];  Service: Endoscopy;  Laterality: N/A;  9:55  . Fatty tumor     2011 (left arm)  . FOOT SURGERY     hammertoe  . TONSILLECTOMY    . TOTAL ABDOMINAL HYSTERECTOMY     2003   Social History   Occupational History  . Occupation: retired  Tobacco Use  . Smoking status: Current Some Day Smoker     Packs/day: 0.50    Years: 33.00    Pack years: 16.50    Types: Cigarettes  . Smokeless tobacco: Never Used  . Tobacco comment: quit 9 yrs ago  Vaping Use  . Vaping Use: Never used  Substance and Sexual Activity  . Alcohol use: No    Alcohol/week: 0.0 standard drinks  . Drug use: No  . Sexual activity: Not Currently

## 2021-01-25 NOTE — Telephone Encounter (Signed)
Requesting approval for gel injections for left knee OA.

## 2021-01-26 ENCOUNTER — Encounter: Payer: Self-pay | Admitting: Family Medicine

## 2021-01-26 ENCOUNTER — Telehealth: Payer: Self-pay | Admitting: Gastroenterology

## 2021-01-26 ENCOUNTER — Telehealth: Payer: Self-pay | Admitting: Family Medicine

## 2021-01-26 NOTE — Progress Notes (Signed)
Labs show:  WBC count is elevated, most likely due to steroids.  Recheck in a few months.  Iron levels are low again.  You should contact your hematologist.  Lipid panel looks good.

## 2021-01-26 NOTE — Telephone Encounter (Signed)
Patient called and is requesting to speak with someone regarding the Capsule Endo she is scheduled for 02/01/21.

## 2021-01-26 NOTE — Telephone Encounter (Signed)
Please advise 

## 2021-01-26 NOTE — Telephone Encounter (Signed)
Pt called asking if she should stop taking her prednisone since she came in for her injections on 01/25/21? Pt states if Terri wants to leave a message in [place], call or email her that would all be fine.  [phone]

## 2021-01-26 NOTE — Telephone Encounter (Signed)
Answered through MyChart.   

## 2021-01-27 ENCOUNTER — Ambulatory Visit (INDEPENDENT_AMBULATORY_CARE_PROVIDER_SITE_OTHER): Payer: BC Managed Care – PPO | Admitting: Podiatry

## 2021-01-27 ENCOUNTER — Other Ambulatory Visit: Payer: Self-pay

## 2021-01-27 ENCOUNTER — Encounter: Payer: Self-pay | Admitting: Podiatry

## 2021-01-27 ENCOUNTER — Ambulatory Visit (INDEPENDENT_AMBULATORY_CARE_PROVIDER_SITE_OTHER): Payer: BC Managed Care – PPO

## 2021-01-27 DIAGNOSIS — M2042 Other hammer toe(s) (acquired), left foot: Secondary | ICD-10-CM | POA: Diagnosis not present

## 2021-01-27 DIAGNOSIS — M779 Enthesopathy, unspecified: Secondary | ICD-10-CM | POA: Diagnosis not present

## 2021-01-27 DIAGNOSIS — M778 Other enthesopathies, not elsewhere classified: Secondary | ICD-10-CM

## 2021-01-27 DIAGNOSIS — M2041 Other hammer toe(s) (acquired), right foot: Secondary | ICD-10-CM

## 2021-01-27 MED ORDER — TRIAMCINOLONE ACETONIDE 10 MG/ML IJ SUSP
10.0000 mg | Freq: Once | INTRAMUSCULAR | Status: AC
  Administered 2021-01-27: 10 mg

## 2021-01-27 NOTE — Telephone Encounter (Signed)
At pt request, returned call. LVM requesting she return my call.

## 2021-01-27 NOTE — Progress Notes (Signed)
Subjective:   Patient ID: Vanessa Lara, female   DOB: 65 y.o.   MRN: [id number]   HPI Patient presents in poor health with severe arthritis and has developed increased discomfort around the fourth MPJ right and feels like she is walking directly on the bone.  She is lost 30 pounds and they are doing investigative work to try to figure out why she is losing so much weight   ROS      Objective:  Physical Exam  Neurovascular status found to be still intact currently with extreme rheumatoid arthritic condition with inflammation fourth MPJ right with significant plantar flexion of the fourth metatarsal head right in a plantar direction with dorsal excursion of the digit right over left foot     Assessment:  Severe arthritis with inflammatory capsulitis and prominent fourth metatarsal head right in long-term rheumatoid arthritic patient     Plan:  H&P reviewed condition did sterile forefoot block right aspirated the joint getting out clear fluid injected quarter cc dexamethasone Kenalog and recommended a soft orthotic with specific offloading fourth MPJ right and the fact someday surgical intervention may be necessary but not until we have a clear bill of health and she continues to go through testing  X-rays indicate severe arthritis of the fourth and fifth MPJ right over left with dorsal excursion noted

## 2021-01-27 NOTE — Telephone Encounter (Signed)
SECOND ATTEMPT:  Called pt to inquire further about her question. LVM requesting returned call.

## 2021-01-28 ENCOUNTER — Telehealth: Payer: Self-pay | Admitting: Family Medicine

## 2021-01-28 NOTE — Telephone Encounter (Signed)
Err

## 2021-01-31 ENCOUNTER — Ambulatory Visit (INDEPENDENT_AMBULATORY_CARE_PROVIDER_SITE_OTHER): Payer: BC Managed Care – PPO | Admitting: Orthopedic Surgery

## 2021-01-31 ENCOUNTER — Encounter: Payer: Self-pay | Admitting: Orthopedic Surgery

## 2021-01-31 DIAGNOSIS — M25512 Pain in left shoulder: Secondary | ICD-10-CM | POA: Diagnosis not present

## 2021-01-31 DIAGNOSIS — M25561 Pain in right knee: Secondary | ICD-10-CM | POA: Diagnosis not present

## 2021-01-31 DIAGNOSIS — M25511 Pain in right shoulder: Secondary | ICD-10-CM | POA: Diagnosis not present

## 2021-01-31 DIAGNOSIS — M25562 Pain in left knee: Secondary | ICD-10-CM

## 2021-02-01 ENCOUNTER — Other Ambulatory Visit: Payer: Self-pay | Admitting: Podiatry

## 2021-02-01 ENCOUNTER — Encounter: Payer: Self-pay | Admitting: Orthopedic Surgery

## 2021-02-01 ENCOUNTER — Telehealth: Payer: Self-pay | Admitting: Gastroenterology

## 2021-02-01 DIAGNOSIS — M778 Other enthesopathies, not elsewhere classified: Secondary | ICD-10-CM

## 2021-02-01 DIAGNOSIS — M2041 Other hammer toe(s) (acquired), right foot: Secondary | ICD-10-CM

## 2021-02-01 DIAGNOSIS — M2042 Other hammer toe(s) (acquired), left foot: Secondary | ICD-10-CM

## 2021-02-01 NOTE — Telephone Encounter (Signed)
Got it. Thank you.

## 2021-02-01 NOTE — Telephone Encounter (Signed)
Pt called to cancel capsule endo because she is feeling sick. Pt states that she has been dealing with other medical issues and will cb to r/s. She did insist on passing this message to Dr. Tarri Glenn.

## 2021-02-01 NOTE — Progress Notes (Signed)
Office Visit Note   Patient: Vanessa Lara           Date of Birth: 1956-04-12           MRN: [id number] Visit Date: 01/31/2021 Requested by: Eunice Blase, MD [address] [place],  [place] [address] PCP: Eunice Blase, MD  Subjective: Chief Complaint  Patient presents with  . Right Shoulder - Pain  . Left Shoulder - Pain  . Left Knee - Pain  . Right Knee - Pain    HPI: Sireen presents for evaluation of bilateral knee and shoulder pain.  Patient has had an MRI scan and radiographs done of all joints.  Had previous injections in the shoulder by Dr. Junius Roads on 01/25/2021 which have helped.  Patient has rheumatoid arthritis.  She tried 2 types of biologic infusions which were not helpful.  She is in pain management and takes steroids which helps but she does not want to take steroids all the time.  By her report she has not had a DEXA scan.  Multiple studies radiographs and MRI scans are reviewed.  These are also reviewed with the patient.  In general she has high-grade partial-thickness bursal sided tear of the anterior supraspinatus tendon with no full-thickness component.  That is on the left shoulder.  Osteopenia also present on plain radiographs of both shoulders.  Right shoulder demonstrates partial-thickness articular surface tear of the supraspinatus tendon as well as tendinosis of the infraspinatus.  Again no clear-cut operative problem in the shoulders.  The left looks a little bit worse than the right.  MRI scans of the knee are also reviewed and there is mild arthritis in the patellofemoral joints but nothing clearly operative in the knee joints.              ROS: All systems reviewed are negative as they relate to the chief complaint within the history of present illness.  Patient denies  fevers or chills.   Assessment & Plan: Visit Diagnoses:  1. Bilateral shoulder pain, unspecified chronicity   2. Pain in both knees, unspecified chronicity     Plan: Impression is bilateral  knee and shoulder pain with no clear operative indication with high chance for improvement with operative treatment of any pathology present.  I think she needs a DEXA scan to evaluate for amount of osteoporosis present and may need to consider biologic treatment depending on findings.  We will get that ordered and let her know what the results are.  As far as surgical intervention in the shoulders I think there is not a high chance of improvement with intervention at this time.  She has no physical exam evidence of weakness or loss of range of motion.  I would consider further episodic injections particularly in the left shoulder over operative therapy at this time.  According to the patient she has PET scan pending for work-up of abdominal pain as well which also mitigates against any type of operative intervention.  Follow-Up Instructions: No follow-ups on file.   Orders:  Orders Placed This Encounter  Procedures  . DG BONE DENSITY (DXA)   No orders of the defined types were placed in this encounter.     Procedures: No procedures performed   Clinical Data: No additional findings.  Objective: Vital Signs: There were no vitals taken for this visit.  Physical Exam:   Constitutional: Patient appears well-developed HEENT:  Head: Normocephalic Eyes:EOM are normal Neck: Normal range of motion Cardiovascular: Normal rate Pulmonary/chest: Effort  normal Neurologic: Patient is alert Skin: Skin is warm Psychiatric: Patient has normal mood and affect    Ortho Exam: Ortho exam demonstrates good cervical spine range of motion.  Passive shoulder range of motion bilaterally is 60/100/170.  Rotator cuff strength is intact infraspinatus extremity subscap muscle testing with no coarse grinding or crepitus with active or passive range of motion of the shoulders.  Patient does have decreased body mass index and small stature and overall diminished musculature in general.  No discrete AC joint  tenderness.  Both knees are examined and she has excellent range of motion with no effusion intact extensor mechanism and stable collateral cruciate ligaments.  Specialty Comments:  No specialty comments available.  Imaging: No results found.   PMFS History: Patient Active Problem List   Diagnosis Date Noted  . Osteoporosis 06/15/2020  . Iron deficiency anemia 04/09/2020  . Primary osteoarthritis involving multiple joints 12/31/2018  . Rheumatoid arthritis involving multiple sites with positive rheumatoid factor ([place]) 12/31/2018  . Irritable bowel syndrome with both constipation and diarrhea 10/28/2018  . Constipation 03/14/2016  . Mitral valve prolapse 11/23/2015  . High cholesterol 11/23/2015   Past Medical History:  Diagnosis Date  . Anemia   . Anxiety   . Arthritis    RA  . Cancer (HCC)    Skin  . CHF (congestive heart failure) ([place])   . Collagen vascular disease ([place])   . Depression   . Endometriosis   . High cholesterol   . IBS (irritable bowel syndrome)   . Lymphadenopathy   . Mitral valve prolapse   . RA (rheumatoid arthritis) ([place])   . Rotator cuff tear    bilateral  . Uterus, adenomyosis     Family History  Problem Relation Age of Onset  . Colon cancer Other   . Bone cancer Mother   . Brain cancer Father   . Brain cancer Sister   . Breast cancer Sister   . Renal Disease Sister   . Pancreatic cancer Neg Hx   . Esophageal cancer Neg Hx   . Stomach cancer Neg Hx   . Rectal cancer Neg Hx     Past Surgical History:  Procedure Laterality Date  . AXILLARY LYMPH NODE BIOPSY Right 10/12/2020   Procedure: RIGHT AXILLARY LYMPH NODE BIOPSY EXCISION;  Surgeon: Stark Klein, MD;  Location: [facility];  Service: General;  Laterality: Right;  . BUNIONECTOMY Right   . CARPAL TUNNEL RELEASE Right   . COLONOSCOPY    . COLONOSCOPY N/A 12/13/2015   Procedure: COLONOSCOPY;  Surgeon: Rogene Houston, MD;  [facility];  Service: Endoscopy;   Laterality: N/A;  9:55  . Fatty tumor     2011 (left arm)  . FOOT SURGERY     hammertoe  . TONSILLECTOMY    . TOTAL ABDOMINAL HYSTERECTOMY     2003   Social History   Occupational History  . Occupation: retired  Tobacco Use  . Smoking status: Current Some Day Smoker    Packs/day: 0.50    Years: 33.00    Pack years: 16.50    Types: Cigarettes  . Smokeless tobacco: Never Used  . Tobacco comment: quit 9 yrs ago  Vaping Use  . Vaping Use: Never used  Substance and Sexual Activity  . Alcohol use: No    Alcohol/week: 0.0 standard drinks  . Drug use: No  . Sexual activity: Not Currently

## 2021-02-01 NOTE — Telephone Encounter (Signed)
Noted. Please notify her PCP. Thanks.

## 2021-02-02 ENCOUNTER — Ambulatory Visit: Payer: BC Managed Care – PPO | Admitting: Orthopaedic Surgery

## 2021-02-02 DIAGNOSIS — M2550 Pain in unspecified joint: Secondary | ICD-10-CM | POA: Diagnosis not present

## 2021-02-02 DIAGNOSIS — R768 Other specified abnormal immunological findings in serum: Secondary | ICD-10-CM | POA: Diagnosis not present

## 2021-02-02 DIAGNOSIS — M150 Primary generalized (osteo)arthritis: Secondary | ICD-10-CM | POA: Diagnosis not present

## 2021-02-02 DIAGNOSIS — M0579 Rheumatoid arthritis with rheumatoid factor of multiple sites without organ or systems involvement: Secondary | ICD-10-CM | POA: Diagnosis not present

## 2021-02-03 NOTE — Telephone Encounter (Signed)
Pt called back and rescheduled her capsule endo for 02/08/21.

## 2021-02-03 NOTE — Telephone Encounter (Signed)
Patient called in to reschedule the capsule endo procedure

## 2021-02-05 ENCOUNTER — Other Ambulatory Visit: Payer: Self-pay | Admitting: Family Medicine

## 2021-02-07 ENCOUNTER — Other Ambulatory Visit: Payer: Self-pay | Admitting: Family Medicine

## 2021-02-07 MED ORDER — GABAPENTIN 250 MG/5ML PO SOLN: 100.0000 mg | Freq: Every day | ORAL | 1 refills | Status: DC

## 2021-02-08 ENCOUNTER — Ambulatory Visit (INDEPENDENT_AMBULATORY_CARE_PROVIDER_SITE_OTHER): Payer: BC Managed Care – PPO | Admitting: Gastroenterology

## 2021-02-08 ENCOUNTER — Encounter: Payer: Self-pay | Admitting: Gastroenterology

## 2021-02-08 DIAGNOSIS — R634 Abnormal weight loss: Secondary | ICD-10-CM

## 2021-02-08 NOTE — Patient Instructions (Signed)
Do not eat or drink for 4 hours after ingesting the capsule  You may have a clear liquid diet at 10:30 am  You may have a light lunch beginning at 12:30 ( ex 1/2 sandwich and a bowl of soup)  You may resume your normal diet at 5:00 pm  If you experience any abdominal pain, nausea ,or vomiting after ingesting the capsule please call [phone] to notify the nurse  You may not have an MRI until you have confirmation that you have passed the capsule.   If you have any questions please call the office and ask for Gerre Couch, RN

## 2021-02-08 NOTE — Progress Notes (Signed)
Capsule ID: [id number]  Exp: 2022-05-17 LOT: [id number]  Patient arrived for Capsule Endoscopy. Reported the prep went well. This nurse explained dietary restrictions for the next few hours. Patient verbalized understanding. Opened capsule, ensured capsule was flashing prior to the patient swallowing the capsule. Patient swallowed capsule without difficulty. Patient instructed to return to the office at 4:00 pm today for removal of the recording equipment, to call the office with any questions and if no capsule was visualized after 72 hours. No further questions by the conclusion of the visit.

## 2021-02-11 ENCOUNTER — Telehealth: Payer: Self-pay | Admitting: Gastroenterology

## 2021-02-11 ENCOUNTER — Telehealth: Payer: Self-pay | Admitting: Podiatry

## 2021-02-11 NOTE — Telephone Encounter (Signed)
Orthotics in.. lvm for pt to call to schedule an appt to pick them up. °

## 2021-02-11 NOTE — Telephone Encounter (Signed)
Inbound call from patient requesting a call from a nurse please.  States she received a call but not sure from who and is requesting her results from her last office visit.

## 2021-02-11 NOTE — Telephone Encounter (Signed)
Let pt know that our office did not call her, that her foot doctor did. Let her know the capsule endo results are not back yet.

## 2021-02-14 ENCOUNTER — Ambulatory Visit: Payer: BC Managed Care – PPO | Admitting: Orthopaedic Surgery

## 2021-02-14 ENCOUNTER — Other Ambulatory Visit: Payer: Self-pay | Admitting: Family Medicine

## 2021-02-17 ENCOUNTER — Telehealth: Payer: Self-pay | Admitting: Gastroenterology

## 2021-02-17 NOTE — Telephone Encounter (Signed)
Called patient back and she states she is bloated and abdomen is tight ( mostly lower abd.). Denies pain. No n/v.  She has not had a BM since 02/08/21 but is passing gas. Hesitant to take a laxative because she states then she has diarrhea. Please advise

## 2021-02-17 NOTE — Telephone Encounter (Signed)
Please call the patient. Capsule endoscopy results were essentially normal. There was one small duodenal erosion. But, there were really no findings to explain weight loss, abdominal pain, or chronic iron deficiency anemia. I recommend that she make a follow-up appointment with Dr. Lorenso Courier to further evaluate her anemia.   Phone call about bloating seen in Epic I would recommend that she use a Fleet enema. If she continues to have difficulties, Miralax 17 g QD-BID PRN. She should be sure that she is drinking at least 64 ounces of water daily.

## 2021-02-17 NOTE — Telephone Encounter (Signed)
Inbound call from patient states IBS flare up. States stomach is swollen, bloated, and tight, believes she should get an xray. Best contact number [phone]

## 2021-02-17 NOTE — Telephone Encounter (Signed)
Addressed in a separate phone note from the same day.

## 2021-02-18 ENCOUNTER — Telehealth: Payer: Self-pay | Admitting: Podiatry

## 2021-02-18 NOTE — Telephone Encounter (Signed)
Pt returned my call from last week and has had orthotics previously and was not able to come in next Wednesday, I told pt since she has had them previously she could just pick them up.

## 2021-02-18 NOTE — Telephone Encounter (Signed)
Called patient and gave capsule endoscopy results, as well as Dr. Payton Emerald recommendation to follow-up with Dr. Lorenso Courier. Asked patient to try a Fleets Enema, and she states that doesn't work, so I let her know Dr. Tarri Glenn would then like her to do MIralax 17g - 1-2 times a day PRN and drink at least 64 oz. Of water each day. She agreed to try.

## 2021-03-04 ENCOUNTER — Telehealth: Payer: Self-pay

## 2021-03-04 NOTE — Telephone Encounter (Signed)
Patient called she stated she received a call from [facility] radiology regarding a referral for a bone density scan patient stated the referral hasn't been authorized patient stated her insurance told her the referral has to be considered as high risk/ preventative care so they can cover the cost patient is requesting a call [phone]

## 2021-03-08 DIAGNOSIS — M0579 Rheumatoid arthritis with rheumatoid factor of multiple sites without organ or systems involvement: Secondary | ICD-10-CM | POA: Diagnosis not present

## 2021-03-08 NOTE — Telephone Encounter (Signed)
Called and left vm for pt to return myc all in regarding the message below

## 2021-03-09 ENCOUNTER — Telehealth: Payer: Self-pay

## 2021-03-09 NOTE — Telephone Encounter (Signed)
VOB has been submitted for SynviscOne, left knee. BV Pending

## 2021-03-10 ENCOUNTER — Telehealth: Payer: Self-pay | Admitting: Family Medicine

## 2021-03-10 NOTE — Telephone Encounter (Signed)
Pt called wanting to speak with Terri. Pt wanted to know when her last cholesterol test was done and asked for a refill on her Lipitor. Pt states she was taking an entire tablet instead of 1/2 tablet. The best call back [phone].

## 2021-03-11 ENCOUNTER — Telehealth: Payer: Self-pay

## 2021-03-11 ENCOUNTER — Other Ambulatory Visit: Payer: Self-pay | Admitting: Family Medicine

## 2021-03-11 MED ORDER — ATORVASTATIN CALCIUM 10 MG PO TABS: 10.0000 mg | ORAL_TABLET | Freq: Every day | ORAL | 1 refills | Status: DC

## 2021-03-11 MED ORDER — ATORVASTATIN CALCIUM 10 MG PO TABS: 10.0000 mg | ORAL_TABLET | Freq: Every day | ORAL | 3 refills | Status: DC

## 2021-03-11 NOTE — Telephone Encounter (Signed)
PA required for SynviscOne, left knee. PA submitted online through Covermymeds. Pending PA# [id number]

## 2021-03-11 NOTE — Telephone Encounter (Signed)
I called and advised the patient. She said she will talk to the nutritionist first for some guidance, as she is the one that mentioned it to her.

## 2021-03-11 NOTE — Telephone Encounter (Signed)
I called and advised the patient of her last lipid panel - 01/25/21 - and reviewed the results with her again.   The patient wanted Dr. Junius Roads to know she has lost more weight and is down to 70 lbs. She says she is trying to eat as much as she can. She asks if lipid infusion is something that would help her - she says the nutritionist had mentioned something about it. Also, is there somewhere else she can be referred for this weight problem?  Lastly, she wanted Dr. Junius Roads to know that Dr. Marlou Sa has ordered a bone density test for her (not scheduled yet).  Please advise on the infusion and referral. She is willing to come in here for office visit, if needed.

## 2021-03-14 ENCOUNTER — Telehealth: Payer: Self-pay

## 2021-03-14 NOTE — Telephone Encounter (Signed)
Approved for SynviscOne, left knee. Buy & Bill Covered at 100% of the allowed amount No Co-pay PA Approval# [id number] Valid 03/11/2021- 09/07/2021  Appt.03/18/2021 with Dr. Junius Roads

## 2021-03-16 DIAGNOSIS — M79642 Pain in left hand: Secondary | ICD-10-CM | POA: Diagnosis not present

## 2021-03-16 DIAGNOSIS — M150 Primary generalized (osteo)arthritis: Secondary | ICD-10-CM | POA: Diagnosis not present

## 2021-03-16 DIAGNOSIS — R768 Other specified abnormal immunological findings in serum: Secondary | ICD-10-CM | POA: Diagnosis not present

## 2021-03-16 DIAGNOSIS — M2550 Pain in unspecified joint: Secondary | ICD-10-CM | POA: Diagnosis not present

## 2021-03-16 DIAGNOSIS — M79641 Pain in right hand: Secondary | ICD-10-CM | POA: Diagnosis not present

## 2021-03-16 DIAGNOSIS — M0579 Rheumatoid arthritis with rheumatoid factor of multiple sites without organ or systems involvement: Secondary | ICD-10-CM | POA: Diagnosis not present

## 2021-03-17 ENCOUNTER — Other Ambulatory Visit: Payer: Self-pay

## 2021-03-18 ENCOUNTER — Other Ambulatory Visit: Payer: Self-pay

## 2021-03-18 ENCOUNTER — Ambulatory Visit (INDEPENDENT_AMBULATORY_CARE_PROVIDER_SITE_OTHER): Payer: BC Managed Care – PPO | Admitting: Family Medicine

## 2021-03-18 ENCOUNTER — Telehealth: Payer: Self-pay | Admitting: Family Medicine

## 2021-03-18 ENCOUNTER — Encounter: Payer: Self-pay | Admitting: Family Medicine

## 2021-03-18 DIAGNOSIS — E611 Iron deficiency: Secondary | ICD-10-CM

## 2021-03-18 DIAGNOSIS — M1711 Unilateral primary osteoarthritis, right knee: Secondary | ICD-10-CM

## 2021-03-18 DIAGNOSIS — M1712 Unilateral primary osteoarthritis, left knee: Secondary | ICD-10-CM | POA: Diagnosis not present

## 2021-03-18 NOTE — Progress Notes (Signed)
Subjective: She is here for planned Synvisc 1 injection for left knee osteoarthritis.  She would also like to get approval for the right knee.  Objective: 1+ effusion with no warmth or erythema.  Procedure: Left knee injection: After sterile prep with Betadine, injected 3 cc 1% lidocaine without epinephrine and Synvisc-1 from superolateral approach, a flash of clear yellow synovial fluid was obtained prior to injection confirming intra-articular placement.

## 2021-03-18 NOTE — Telephone Encounter (Signed)
Requesting approval for right knee gel injections for OA.

## 2021-03-18 NOTE — Addendum Note (Signed)
Addended by: Hortencia Pilar on: 03/18/2021 02:33 PM   Modules accepted: Orders

## 2021-03-19 LAB — CBC WITH DIFFERENTIAL/PLATELET
Absolute Monocytes: 930 cells/uL (ref 200–950)
Basophils Absolute: 75 cells/uL (ref 0–200)
Basophils Relative: 0.5 %
Eosinophils Absolute: 90 cells/uL (ref 15–500)
Eosinophils Relative: 0.6 %
HCT: 37.5 % (ref 35.0–45.0)
Hemoglobin: 12.4 g/dL (ref 11.7–15.5)
Lymphs Abs: 2355 cells/uL (ref 850–3900)
MCH: 29.2 pg (ref 27.0–33.0)
MCHC: 33.1 g/dL (ref 32.0–36.0)
MCV: 88.4 fL (ref 80.0–100.0)
MPV: 9.7 fL (ref 7.5–12.5)
Monocytes Relative: 6.2 %
Neutro Abs: 11550 cells/uL — ABNORMAL HIGH (ref 1500–7800)
Neutrophils Relative %: 77 %
Platelets: 490 10*3/uL — ABNORMAL HIGH (ref 140–400)
RBC: 4.24 10*6/uL (ref 3.80–5.10)
RDW: 16.5 % — ABNORMAL HIGH (ref 11.0–15.0)
Total Lymphocyte: 15.7 %
WBC: 15 10*3/uL — ABNORMAL HIGH (ref 3.8–10.8)

## 2021-03-19 LAB — IRON,TIBC AND FERRITIN PANEL
%SAT: 18 % (calc) (ref 16–45)
Ferritin: 65 ng/mL (ref 16–288)
Iron: 47 ug/dL (ref 45–160)
TIBC: 259 mcg/dL (calc) (ref 250–450)

## 2021-03-21 ENCOUNTER — Telehealth: Payer: Self-pay

## 2021-03-21 ENCOUNTER — Telehealth: Payer: Self-pay | Admitting: Family Medicine

## 2021-03-21 MED ORDER — MEGESTROL ACETATE 40 MG PO TABS: 40.0000 mg | ORAL_TABLET | Freq: Every day | ORAL | 6 refills | Status: DC

## 2021-03-21 NOTE — Telephone Encounter (Signed)
Labs show:  White blood cell count is elevated again at 15.  I recommend that she make another appointment with Dr. Lorenso Courier, her hematologist/oncologist, to discuss this further.  Iron levels look sufficient.  Iron is 47 and ferritin is 65.  She probably does not need an infusion at this point.

## 2021-03-21 NOTE — Telephone Encounter (Signed)
I called and advised the patient this was sent in to her pharmacy.

## 2021-03-21 NOTE — Telephone Encounter (Signed)
Patient called she is requesting a rx for Megace to be called into the pharmacy call [phone]

## 2021-03-21 NOTE — Telephone Encounter (Signed)
Please advise 

## 2021-03-21 NOTE — Addendum Note (Signed)
Addended by: Hortencia Pilar on: 03/21/2021 05:06 PM   Modules accepted: Orders

## 2021-03-21 NOTE — Telephone Encounter (Signed)
I called and advised the patient of the results. She will call and make an appointment with Dr. Lorenso Courier to follow up on her WBC count.

## 2021-03-22 NOTE — Telephone Encounter (Signed)
Noted  

## 2021-03-23 ENCOUNTER — Telehealth: Payer: Self-pay

## 2021-03-23 NOTE — Telephone Encounter (Signed)
VOB submitted for SynviscOne, right knee. Pending BV. 

## 2021-03-25 DIAGNOSIS — M9901 Segmental and somatic dysfunction of cervical region: Secondary | ICD-10-CM | POA: Diagnosis not present

## 2021-03-25 DIAGNOSIS — M9902 Segmental and somatic dysfunction of thoracic region: Secondary | ICD-10-CM | POA: Diagnosis not present

## 2021-03-25 DIAGNOSIS — M546 Pain in thoracic spine: Secondary | ICD-10-CM | POA: Diagnosis not present

## 2021-03-25 DIAGNOSIS — M542 Cervicalgia: Secondary | ICD-10-CM | POA: Diagnosis not present

## 2021-03-28 ENCOUNTER — Telehealth: Payer: Self-pay

## 2021-03-28 ENCOUNTER — Telehealth: Payer: Self-pay | Admitting: Family Medicine

## 2021-03-28 NOTE — Telephone Encounter (Signed)
Pt called wanted to know the status of her injection approval and if there was anything on her end to get it processed sooner because "she is in lots of pain and is scared she will fall" as the pain makes her unstable to walk. The best call back number is [phone].

## 2021-03-28 NOTE — Telephone Encounter (Signed)
Talked with patient and advised her that I am currently waiting on authorization to be approved through her insurance, once approved I will give patient a call to schedule.  Patient voiced that she understands.

## 2021-03-28 NOTE — Telephone Encounter (Signed)
PA required for SynviscOne, right knee. PA submitted online through Covermymeds Pending PA# [id number]

## 2021-03-28 NOTE — Telephone Encounter (Signed)
noted 

## 2021-03-29 ENCOUNTER — Telehealth: Payer: Self-pay

## 2021-03-29 NOTE — Telephone Encounter (Signed)
Called and left a VM for patient to CB to schedule with Dr. Junius Roads for gel injection.  Approved for SynviscOne, right knee. [place] Patient is Covered at 100% through her insurance. No Co-pay PA Approval# [id number] Valid 03/28/2021- 09/23/2021

## 2021-03-30 ENCOUNTER — Telehealth: Payer: Self-pay | Admitting: Orthopedic Surgery

## 2021-03-30 ENCOUNTER — Telehealth: Payer: Self-pay | Admitting: Hematology and Oncology

## 2021-03-30 NOTE — Telephone Encounter (Signed)
Pt called stating she was supposed to get a bone density test and she was told by our office it didn't need to be pre approved but [facility] and [place] said she needs to be pre approved. Pt would like our office to call [employer] to figure out what's going on. Pt also stated we need to tell [employer] she's a high risk so they'll cover it and she would like a CB ASAP with an update.  [phone]

## 2021-03-30 NOTE — Telephone Encounter (Signed)
Called pt to sch appt per 6/29 sch msg. No answer. Left msg for pt to call back to sch app.

## 2021-04-01 ENCOUNTER — Ambulatory Visit (INDEPENDENT_AMBULATORY_CARE_PROVIDER_SITE_OTHER): Payer: BC Managed Care – PPO | Admitting: Family Medicine

## 2021-04-01 ENCOUNTER — Other Ambulatory Visit: Payer: Self-pay

## 2021-04-01 DIAGNOSIS — M1711 Unilateral primary osteoarthritis, right knee: Secondary | ICD-10-CM

## 2021-04-01 DIAGNOSIS — R634 Abnormal weight loss: Secondary | ICD-10-CM

## 2021-04-01 NOTE — Progress Notes (Signed)
Subjective: She is here for planned right knee Synvisc 1 injection.  Objective: 1+ effusion with no warmth or erythema.  Very tender on the medial patellofemoral joint.  Procedure: Right knee injection: After sterile prep with Betadine, injected 3 cc 1% lidocaine without epinephrine and Synvisc 1 from superomedial approach, a flash of clear yellow synovial fluid was obtained prior to injection.  Impression: Right knee osteoarthritis   plan: Follow-up as needed

## 2021-04-04 ENCOUNTER — Other Ambulatory Visit: Payer: Self-pay | Admitting: Family Medicine

## 2021-04-05 ENCOUNTER — Telehealth: Payer: Self-pay | Admitting: Family Medicine

## 2021-04-05 ENCOUNTER — Telehealth: Payer: Self-pay | Admitting: *Deleted

## 2021-04-05 NOTE — Telephone Encounter (Signed)
I called [employer] and [employer] Tempie Donning and I explained to him/her the medical neccsity of pt needing DEXA and they stated there were no limitations on scan and will be covered in full.   Call ref # [id number]  I faxed order to [facility] and noted the above that pt will and should be covered in full and no limitations

## 2021-04-05 NOTE — Telephone Encounter (Signed)
Pt is asking for a call back about her pains. Please call pt at [phone].

## 2021-04-05 NOTE — Telephone Encounter (Signed)
-----   Message from Marlyne Beards, [place] sent at 04/01/2021  4:50 PM EDT ----- Regarding: bone density scan I talked with [name] at her office visit today. She said the bone scans are not usually covered by insurance so often -- ? Every 2 years is ok, but not every year. She is at high risk for fracture, so that is why she is wanting to do it sooner.

## 2021-04-05 NOTE — Telephone Encounter (Signed)
I called [employer] and sw Tempie Donning and I explained to him/her the medical neccsity of pt needing DEXA and they stated there were no limitations on scan and will be covered in full.   Call ref # [id number]

## 2021-04-06 ENCOUNTER — Telehealth: Payer: Self-pay | Admitting: Family Medicine

## 2021-04-06 ENCOUNTER — Ambulatory Visit (INDEPENDENT_AMBULATORY_CARE_PROVIDER_SITE_OTHER): Payer: BC Managed Care – PPO | Admitting: Family Medicine

## 2021-04-06 ENCOUNTER — Other Ambulatory Visit: Payer: Self-pay

## 2021-04-06 ENCOUNTER — Encounter: Payer: Self-pay | Admitting: Family Medicine

## 2021-04-06 ENCOUNTER — Ambulatory Visit: Payer: BC Managed Care – PPO | Admitting: Family Medicine

## 2021-04-06 VITALS — Ht 60.0 in | Wt 73.2 lb

## 2021-04-06 DIAGNOSIS — M1712 Unilateral primary osteoarthritis, left knee: Secondary | ICD-10-CM

## 2021-04-06 DIAGNOSIS — M1711 Unilateral primary osteoarthritis, right knee: Secondary | ICD-10-CM

## 2021-04-06 NOTE — Progress Notes (Signed)
Office Visit Note   Patient: Vanessa Lara           Date of Birth: [birth year]           MRN: [id number] Visit Date: 04/06/2021 Requested by: Eunice Blase, MD [address] [place],   [address] PCP: Eunice Blase, MD  Subjective: Chief Complaint  Patient presents with   Right Knee - Pain    Knees have both been sore with some swelling, right greater than left. Pain in the muscles of the posterior legs, bilaterally.   Left Knee - Pain    HPI: She is here with persistent bilateral knee pain.  Gel injections have not helped.  They are both swollen, achy and painful.  She can hardly walk.  She is also having diarrhea and abdominal discomfort.  Apparently she was supposed to be on Medrol for the past couple months but it was not filled by the pharmacy.                ROS:   All other systems were reviewed and are negative.  Objective: Vital Signs: Ht 5' (1.524 m)   Wt 73 lb 3.2 oz (33.2 kg)   BMI 14.30 kg/m   Physical Exam:  General:  Alert and oriented, in no acute distress. Pulm:  Breathing unlabored. Psy:  Normal mood, congruent affect. Skin: No erythema or warmth Knees: Bilateral 1-2+ effusions.  Imaging: No results found.  Assessment & Plan: Bilateral knee pain with mild osteoarthritis and rheumatoid arthritis -Steroid injections today.  2.  Abdominal pain and diarrhea with unintended weight loss - Stool testing ordered.     Procedures: Bilateral knee injections: After sterile prep with Betadine, injected 3 cc 1% lidocaine without epinephrine and 6 mg betamethasone into each knee, a flash of synovial fluid was obtained prior to each injection.       PMFS History: Patient Active Problem List   Diagnosis Date Noted   Osteoporosis 06/15/2020   Iron deficiency anemia 04/09/2020   Primary osteoarthritis involving multiple joints 12/31/2018   Rheumatoid arthritis involving multiple sites with positive rheumatoid factor ([place]) 12/31/2018    Irritable bowel syndrome with both constipation and diarrhea 10/28/2018   Constipation 03/14/2016   Mitral valve prolapse 11/23/2015   High cholesterol 11/23/2015   Past Medical History:  Diagnosis Date   Anemia    Anxiety    Arthritis    RA   Cancer (HCC)    Skin   CHF (congestive heart failure) (HCC)    Collagen vascular disease (HCC)    Depression    Endometriosis    High cholesterol    IBS (irritable bowel syndrome)    Lymphadenopathy    Mitral valve prolapse    RA (rheumatoid arthritis) (HCC)    Rotator cuff tear    bilateral   Uterus, adenomyosis     Family History  Problem Relation Age of Onset   Colon cancer Other    Bone cancer Mother    Brain cancer Father    Brain cancer Sister    Breast cancer Sister    Renal Disease Sister    Pancreatic cancer Neg Hx    Esophageal cancer Neg Hx    Stomach cancer Neg Hx    Rectal cancer Neg Hx     Past Surgical History:  Procedure Laterality Date   AXILLARY LYMPH NODE BIOPSY Right 10/12/2020   Procedure: RIGHT AXILLARY LYMPH NODE BIOPSY EXCISION;  Surgeon: Stark Klein, MD;  Location: [facility];  Service: General;  Laterality: Right;   BUNIONECTOMY Right    CARPAL TUNNEL RELEASE Right    COLONOSCOPY     COLONOSCOPY N/A 12/13/2015   Procedure: COLONOSCOPY;  Surgeon: Rogene Houston, MD;  [facility];  Service: Endoscopy;  Laterality: N/A;  9:55   Fatty tumor     2011 (left arm)   FOOT SURGERY     hammertoe   TONSILLECTOMY     TOTAL ABDOMINAL HYSTERECTOMY     2003   Social History   Occupational History   Occupation: retired  Tobacco Use   Smoking status: Some Days    Packs/day: 0.50    Years: 33.00    Pack years: 16.50    Types: Cigarettes   Smokeless tobacco: Never   Tobacco comments:    quit 9 yrs ago  Vaping Use   Vaping Use: Never used  Substance and Sexual Activity   Alcohol use: No    Alcohol/week: 0.0 standard drinks   Drug use: No   Sexual activity: Not Currently

## 2021-04-06 NOTE — Telephone Encounter (Signed)
Please see yesterday message

## 2021-04-06 NOTE — Telephone Encounter (Signed)
Coming in today at 3:40.

## 2021-04-06 NOTE — Telephone Encounter (Signed)
Pt called checking on the status of her bone density test; she has an appt @ 3:40 this afternoon and would like to know something by then. Pt states she has been trying to get this scheduled for awhile.

## 2021-04-07 ENCOUNTER — Telehealth: Payer: Self-pay | Admitting: Family Medicine

## 2021-04-07 DIAGNOSIS — E7800 Pure hypercholesterolemia, unspecified: Secondary | ICD-10-CM

## 2021-04-07 DIAGNOSIS — M0579 Rheumatoid arthritis with rheumatoid factor of multiple sites without organ or systems involvement: Secondary | ICD-10-CM

## 2021-04-07 NOTE — Telephone Encounter (Signed)
I called. The patient would like to see a cardiologist in the [facility] system, preferably female. She is wanting to get established with one again (used to see Dr. Nevada Crane years ago --- ? Retired now) to keep a good check on her heart, especially since she is soon 65 y/o and has taken a lot of steroid medications over the years.

## 2021-04-07 NOTE — Telephone Encounter (Signed)
Patient called. She would like Terri to call her. [phone]

## 2021-04-08 ENCOUNTER — Ambulatory Visit: Payer: BC Managed Care – PPO | Admitting: Family Medicine

## 2021-04-08 ENCOUNTER — Other Ambulatory Visit: Payer: Self-pay | Admitting: Family Medicine

## 2021-04-08 DIAGNOSIS — M9902 Segmental and somatic dysfunction of thoracic region: Secondary | ICD-10-CM | POA: Diagnosis not present

## 2021-04-08 DIAGNOSIS — M542 Cervicalgia: Secondary | ICD-10-CM | POA: Diagnosis not present

## 2021-04-08 DIAGNOSIS — M9901 Segmental and somatic dysfunction of cervical region: Secondary | ICD-10-CM | POA: Diagnosis not present

## 2021-04-08 DIAGNOSIS — M546 Pain in thoracic spine: Secondary | ICD-10-CM | POA: Diagnosis not present

## 2021-04-08 NOTE — Telephone Encounter (Signed)
I called and advised the patient that she is being referred to Dr. Rosaria Ferries. Someone from that office should be contacting her to schedule that appointment.

## 2021-04-12 ENCOUNTER — Other Ambulatory Visit: Payer: Self-pay

## 2021-04-12 ENCOUNTER — Ambulatory Visit (HOSPITAL_COMMUNITY)
Admission: RE | Admit: 2021-04-12 | Discharge: 2021-04-12 | Disposition: A | Discharge status: Home / Self Care | Payer: BC Managed Care – PPO | Source: Ambulatory Visit | Attending: Orthopedic Surgery | Admitting: Orthopedic Surgery

## 2021-04-12 DIAGNOSIS — M25562 Pain in left knee: Secondary | ICD-10-CM | POA: Insufficient documentation

## 2021-04-12 DIAGNOSIS — Z780 Asymptomatic menopausal state: Secondary | ICD-10-CM | POA: Diagnosis not present

## 2021-04-12 DIAGNOSIS — M25511 Pain in right shoulder: Secondary | ICD-10-CM | POA: Diagnosis not present

## 2021-04-12 DIAGNOSIS — M25561 Pain in right knee: Secondary | ICD-10-CM | POA: Insufficient documentation

## 2021-04-12 DIAGNOSIS — M25512 Pain in left shoulder: Secondary | ICD-10-CM | POA: Insufficient documentation

## 2021-04-12 DIAGNOSIS — M810 Age-related osteoporosis without current pathological fracture: Secondary | ICD-10-CM | POA: Diagnosis not present

## 2021-04-13 ENCOUNTER — Telehealth: Payer: Self-pay | Admitting: Family Medicine

## 2021-04-13 NOTE — Telephone Encounter (Signed)
Pt called requesting a call back. She called to give updated information. Pt stated she finally got her bone density done and can Dr. Junius Roads send in a script to [facility] for 2 knee braces and a back brace. Please call pt at [phone].

## 2021-04-13 NOTE — Telephone Encounter (Signed)
I notified the patient of her bone density scan results. She does have an appointment with an endocrinologist in August - can discuss medications for this at that time.   The patient has had a back brace for 40 years, that she wears to keep her from stooping forward. She has lost so much weight that it does not fit anymore. She is asking for an Rx for a new back brace and also for 2 new knee braces small enough to fit her legs. These braces would just be to give her knees some support. The patient asks that these be faxed to [facility] in [place] [phone]) - she would like to go tomorrow afternoon for them to measure her and order which types of braces she needs.

## 2021-04-13 NOTE — Telephone Encounter (Signed)
Please review results (ordered by Dr. Marlou Sa) and advise.

## 2021-04-14 NOTE — Telephone Encounter (Signed)
Faxed to [facility] in [place].

## 2021-04-15 DIAGNOSIS — M9902 Segmental and somatic dysfunction of thoracic region: Secondary | ICD-10-CM | POA: Diagnosis not present

## 2021-04-15 DIAGNOSIS — M9901 Segmental and somatic dysfunction of cervical region: Secondary | ICD-10-CM | POA: Diagnosis not present

## 2021-04-15 DIAGNOSIS — M542 Cervicalgia: Secondary | ICD-10-CM | POA: Diagnosis not present

## 2021-04-15 DIAGNOSIS — M546 Pain in thoracic spine: Secondary | ICD-10-CM | POA: Diagnosis not present

## 2021-04-17 NOTE — Progress Notes (Signed)
Hi Luke I think you order this 1 can you call her

## 2021-04-19 ENCOUNTER — Telehealth: Payer: Self-pay | Admitting: Family Medicine

## 2021-04-19 MED ORDER — PROMETHAZINE HCL 25 MG PO TABS: 12.5000 mg | ORAL_TABLET | Freq: Four times a day (QID) | ORAL | 0 refills | Status: DC | PRN

## 2021-04-19 NOTE — Telephone Encounter (Signed)
Please advise 

## 2021-04-19 NOTE — Telephone Encounter (Signed)
I called. No answer at the number in the message. Left voice message on her mobile that phenergan was sent in.

## 2021-04-19 NOTE — Telephone Encounter (Signed)
Pt called in stating she's been nauseated from the prednisone Dr.Beckman prescribed for her. She states she stopped taking the prednisone 04/16/21 but still feels sick. Pt would like a CB when something has been called in.   [phone]

## 2021-04-20 ENCOUNTER — Other Ambulatory Visit: Payer: Self-pay | Admitting: Family Medicine

## 2021-04-20 ENCOUNTER — Other Ambulatory Visit: Payer: Self-pay

## 2021-04-20 ENCOUNTER — Ambulatory Visit (INDEPENDENT_AMBULATORY_CARE_PROVIDER_SITE_OTHER): Payer: BC Managed Care – PPO | Admitting: Family Medicine

## 2021-04-20 ENCOUNTER — Ambulatory Visit: Payer: BC Managed Care – PPO | Admitting: Podiatry

## 2021-04-20 ENCOUNTER — Encounter: Payer: Self-pay | Admitting: Family Medicine

## 2021-04-20 DIAGNOSIS — M5441 Lumbago with sciatica, right side: Secondary | ICD-10-CM

## 2021-04-20 DIAGNOSIS — M1711 Unilateral primary osteoarthritis, right knee: Secondary | ICD-10-CM | POA: Diagnosis not present

## 2021-04-20 DIAGNOSIS — G894 Chronic pain syndrome: Secondary | ICD-10-CM

## 2021-04-20 DIAGNOSIS — M1712 Unilateral primary osteoarthritis, left knee: Secondary | ICD-10-CM | POA: Diagnosis not present

## 2021-04-20 NOTE — Progress Notes (Signed)
Office Visit Note   Patient: Vanessa Lara           Date of Birth: Feb 23, 1956           MRN: [id number] Visit Date: 04/20/2021 Requested by: Eunice Blase, MD [address] [place],  [place] [address] PCP: Eunice Blase, MD  Subjective: Chief Complaint  Patient presents with   Right Knee - Pain    Pain in the medial aspect of the knee. "Something is not quite right" in the knee. The methylprednisolone that Dr. Amil Amen had given her caused her IBS to flare up (diarrhea and nausea), so she stopped it. Has started having pain in the buttock, radiating down the leg, on 04/16/21. Wakes up with cramping in the lower legs.  She has gotten her new knee sleeves.     HPI: She is here with persistent bilateral knee pain.  Injections have not helped, gel injections and steroid injections.  Constant pain on the medial aspect.  Now she is having low back pain with sciatica down the right leg.  She does have a history of vascular process.                ROS:   All other systems were reviewed and are negative.  Objective: Vital Signs: There were no vitals taken for this visit.  Physical Exam:  General:  Alert and oriented, in no acute distress. Pulm:  Breathing unlabored. Psy:  Normal mood, congruent affect.  Low back: She is tender in the right sciatic notch.  Lower extremity strength is still normal. Knees: Both are very tender on the medial joint line and medial femoral condyle.    Imaging: No results found.  Assessment & Plan: Persistent bilateral knee pain with osteoarthritis, cannot rule out stress fracture. -MRI of both knees to further evaluate.  2.  Low back pain with right-sided sciatica - MRI, possibly epidural injection depending on the findings.     Procedures: No procedures performed        PMFS History: Patient Active Problem List   Diagnosis Date Noted   Osteoporosis 06/15/2020   Iron deficiency anemia 04/09/2020   Primary osteoarthritis involving  multiple joints 12/31/2018   Rheumatoid arthritis involving multiple sites with positive rheumatoid factor ([place]) 12/31/2018   Irritable bowel syndrome with both constipation and diarrhea 10/28/2018   Constipation 03/14/2016   Mitral valve prolapse 11/23/2015   High cholesterol 11/23/2015   Past Medical History:  Diagnosis Date   Anemia    Anxiety    Arthritis    RA   Cancer (HCC)    Skin   CHF (congestive heart failure) (HCC)    Collagen vascular disease (HCC)    Depression    Endometriosis    High cholesterol    IBS (irritable bowel syndrome)    Lymphadenopathy    Mitral valve prolapse    RA (rheumatoid arthritis) (HCC)    Rotator cuff tear    bilateral   Uterus, adenomyosis     Family History  Problem Relation Age of Onset   Colon cancer Other    Bone cancer Mother    Brain cancer Father    Brain cancer Sister    Breast cancer Sister    Renal Disease Sister    Pancreatic cancer Neg Hx    Esophageal cancer Neg Hx    Stomach cancer Neg Hx    Rectal cancer Neg Hx     Past Surgical History:  Procedure Laterality Date   AXILLARY  LYMPH NODE BIOPSY Right 10/12/2020   Procedure: RIGHT AXILLARY LYMPH NODE BIOPSY EXCISION;  Surgeon: Stark Klein, MD;  Location: [facility];  Service: General;  Laterality: Right;   BUNIONECTOMY Right    CARPAL TUNNEL RELEASE Right    COLONOSCOPY     COLONOSCOPY N/A 12/13/2015   Procedure: COLONOSCOPY;  Surgeon: Rogene Houston, MD;  [facility];  Service: Endoscopy;  Laterality: N/A;  9:55   Fatty tumor     2011 (left arm)   FOOT SURGERY     hammertoe   TONSILLECTOMY     TOTAL ABDOMINAL HYSTERECTOMY     2003   Social History   Occupational History   Occupation: retired  Tobacco Use   Smoking status: Some Days    Packs/day: 0.50    Years: 33.00    Pack years: 16.50    Types: Cigarettes   Smokeless tobacco: Never   Tobacco comments:    quit 9 yrs ago  Vaping Use   Vaping Use: Never used  Substance  and Sexual Activity   Alcohol use: No    Alcohol/week: 0.0 standard drinks   Drug use: No   Sexual activity: Not Currently

## 2021-04-21 ENCOUNTER — Ambulatory Visit: Payer: BC Managed Care – PPO | Admitting: Orthopedic Surgery

## 2021-04-22 ENCOUNTER — Other Ambulatory Visit: Payer: Self-pay | Admitting: Family Medicine

## 2021-04-22 DIAGNOSIS — M1712 Unilateral primary osteoarthritis, left knee: Secondary | ICD-10-CM

## 2021-04-22 DIAGNOSIS — M5441 Lumbago with sciatica, right side: Secondary | ICD-10-CM

## 2021-04-22 DIAGNOSIS — M1711 Unilateral primary osteoarthritis, right knee: Secondary | ICD-10-CM

## 2021-04-22 NOTE — Progress Notes (Signed)
M

## 2021-04-25 ENCOUNTER — Telehealth: Payer: Self-pay | Admitting: Family Medicine

## 2021-04-25 NOTE — Telephone Encounter (Signed)
I called and advised her about the MRIs not being authorized and that she needs to try PT first. Dr. Junius Roads put in a referral - the facility should be contacting her to schedule an appointment.   She wants to know when she can have another cortisone injection -- just had bilateral ones earlier this month. I advised her that we normally do not do these any sooner than every 3 months, but that I would ask. She said her knees have some fluid on them, right greater than left.  She asks if she should go back on oral prednisone, to help get her through until she can have injections. She has also called Dr. Melissa Noon office to see what is next to try for her RA.   Please advise.

## 2021-04-25 NOTE — Telephone Encounter (Signed)
Pt calling stating she is in to much pain to wait for her mri appt on August 7th. Pt states that is the soonest they could get her worked in and wanted to ask if there was anywhere else she could get referred to that might see her sooner. The best call back number is [phone].

## 2021-04-26 ENCOUNTER — Telehealth: Payer: Self-pay | Admitting: Family Medicine

## 2021-04-26 NOTE — Telephone Encounter (Signed)
Can you tell me what codes she would need to tell her insurance company for MRIs of both knees and Lsp? She has been talking with them, herself, trying to get them to authorize the tests.

## 2021-04-26 NOTE — Telephone Encounter (Signed)
Pt asking for Terri to call her about coding for her MRI. Please call pt at [phone].

## 2021-04-27 ENCOUNTER — Telehealth: Payer: Self-pay | Admitting: Family Medicine

## 2021-04-27 NOTE — Telephone Encounter (Signed)
See message from 04/27/21 regarding MRIs.

## 2021-04-27 NOTE — Telephone Encounter (Signed)
Can you dictate something that says this?

## 2021-04-27 NOTE — Telephone Encounter (Signed)
I called and advised the patient. 

## 2021-04-27 NOTE — Telephone Encounter (Signed)
The patient has MRIs of the knees and Lsp scheduled for 04/28/21 at 6:00 pm at [facility]. The patient spoke with someone at [facility], telephone [phone] ext [phone]. She has no fax # for them. Dr. Junius Roads wrote out 3 scripts, one for each MRI, along with what he is looking for.  I will talk with you about this in the morning, early.

## 2021-04-27 NOTE — Telephone Encounter (Signed)
Pt calling because she just got off the phone with her [place] wants an order written for her to get an mri. They stated it must include clarification for needing mri including detailing the need for the back mri and if they are looking for a knee ligament tear or something else. Pt was given the pre authorization number of [id number] with codes 73718, [phone], and 73720. Pt wanting to get it ordered so she can make her appt for tomorrow at 6. Ins company stated that if she does not get this to go through it will be 180 days until new can be ordered by Dr. Junius Roads The best call back number for any questions is [phone].

## 2021-04-28 ENCOUNTER — Ambulatory Visit (HOSPITAL_COMMUNITY): Payer: BC Managed Care – PPO

## 2021-04-28 ENCOUNTER — Ambulatory Visit: Payer: BC Managed Care – PPO | Admitting: Podiatry

## 2021-04-28 ENCOUNTER — Telehealth: Payer: Self-pay | Admitting: *Deleted

## 2021-04-28 NOTE — Telephone Encounter (Signed)
Please schedule phone note for today

## 2021-04-28 NOTE — Telephone Encounter (Signed)
Contacted pt to explain in detail reason for the denials on her bilateral knee MRI and lumbar spine MRI, informed her the reason denial for knees is because she already had those done and approved by insurance in April and does not expire until Oct but was a one time coverage, for the lumber spine explained they are wanting clinicals stating she had concervative treatment such as Physical Therapy which she has not had. Pt was not happy that she can not have these done and that insurance denied her, I told her I can send clinicals back to insurance co as a Provider courtesy Review but was not a guarantee that she will be approved. Pt voiced understanding.   Pt asked about paying as self pay to the Hospital to have the procedures done and I explained to her she would have to contact the billing dept there and see if there is some kind of discount they can do for self pay. Pt is going to check on that in the mean time will fax over clinicals for further review.

## 2021-04-29 ENCOUNTER — Ambulatory Visit: Payer: BC Managed Care – PPO | Admitting: Surgical

## 2021-04-29 ENCOUNTER — Telehealth: Payer: Self-pay | Admitting: Family Medicine

## 2021-04-29 MED ORDER — MELOXICAM 15 MG PO TABS: 7.5000 mg | ORAL_TABLET | Freq: Every day | ORAL | 0 refills | Status: DC | PRN

## 2021-04-29 MED ORDER — LORAZEPAM 0.5 MG PO TABS: ORAL_TABLET | ORAL | 0 refills | Status: DC

## 2021-04-29 NOTE — Telephone Encounter (Signed)
Patient called asked if she can get her medication Ativan for a 60 day supply and Meloxicam for a 90 day supply?  Patient said it is hard for her to get out to get her medication. The number to contact patient is [phone]

## 2021-04-29 NOTE — Telephone Encounter (Signed)
Please advise 

## 2021-05-02 ENCOUNTER — Ambulatory Visit: Payer: BC Managed Care – PPO | Admitting: Podiatry

## 2021-05-04 ENCOUNTER — Other Ambulatory Visit: Payer: Self-pay

## 2021-05-04 ENCOUNTER — Ambulatory Visit (INDEPENDENT_AMBULATORY_CARE_PROVIDER_SITE_OTHER): Payer: BC Managed Care – PPO | Admitting: Orthopedic Surgery

## 2021-05-04 ENCOUNTER — Telehealth: Payer: Self-pay | Admitting: Gastroenterology

## 2021-05-04 DIAGNOSIS — M5441 Lumbago with sciatica, right side: Secondary | ICD-10-CM

## 2021-05-04 DIAGNOSIS — M1712 Unilateral primary osteoarthritis, left knee: Secondary | ICD-10-CM | POA: Diagnosis not present

## 2021-05-04 NOTE — Telephone Encounter (Signed)
Called pt to inquire further about this message. LVM requesting returned call.

## 2021-05-05 ENCOUNTER — Ambulatory Visit (HOSPITAL_COMMUNITY): Payer: BC Managed Care – PPO

## 2021-05-05 DIAGNOSIS — M1711 Unilateral primary osteoarthritis, right knee: Secondary | ICD-10-CM | POA: Diagnosis not present

## 2021-05-05 DIAGNOSIS — M5441 Lumbago with sciatica, right side: Secondary | ICD-10-CM | POA: Diagnosis not present

## 2021-05-05 DIAGNOSIS — M25661 Stiffness of right knee, not elsewhere classified: Secondary | ICD-10-CM | POA: Diagnosis not present

## 2021-05-05 DIAGNOSIS — M1712 Unilateral primary osteoarthritis, left knee: Secondary | ICD-10-CM | POA: Diagnosis not present

## 2021-05-05 NOTE — Telephone Encounter (Signed)
SECOND ATTEMPT:  Called to inquire further. Again, LVM requesting returned call.

## 2021-05-06 ENCOUNTER — Other Ambulatory Visit: Payer: Self-pay | Admitting: Family Medicine

## 2021-05-08 ENCOUNTER — Encounter: Payer: Self-pay | Admitting: Orthopedic Surgery

## 2021-05-08 NOTE — Progress Notes (Signed)
Office Visit Note   Patient: Vanessa Lara           Date of Birth: 30-Mar-1956           MRN: [id number] Visit Date: 05/04/2021 Requested by: Eunice Blase, MD [address] [place],  [place] [address] PCP: Eunice Blase, MD  Subjective: Chief Complaint  Patient presents with   Other    Scan review    HPI: Linet is a 65 year old patient with bilateral knee pain.  She is here for bone density results today.  She is very osteoporotic.  Her primary care physician will look at that.  She has MRI pending on both knees because of effusion.  She has rheumatoid arthritis as well as known history of osteoporosis.  She has had long history of injections in the knees which have not helped much recently.              ROS: All systems reviewed are negative as they relate to the chief complaint within the history of present illness.  Patient denies  fevers or chills.   Assessment & Plan: Visit Diagnoses:  1. Acute right-sided low back pain with right-sided sciatica   2. Unilateral primary osteoarthritis, left knee     Plan: Impression is significant osteoporosis in a patient who has some degree of malnourishment and significantly decreased body mass index.  Recommendation for osteoporosis treatment requested from primary care physician.  MRI pending on the knees.  We will see her back after the studies.  Follow-Up Instructions: Return for after MRI.   Orders:  No orders of the defined types were placed in this encounter.  No orders of the defined types were placed in this encounter.     Procedures: No procedures performed   Clinical Data: No additional findings.  Objective: Vital Signs: There were no vitals taken for this visit.  Physical Exam:   Constitutional: Patient appears very thin.   HEENT:  Head: Normocephalic Eyes:EOM are normal Neck: Normal range of motion Cardiovascular: Normal rate Pulmonary/chest: Effort normal Neurologic: Patient is alert Skin: Skin is  warm Psychiatric: Patient has normal mood and affect   Ortho Exam: Ortho exam demonstrates trace effusion in both knees.  Range of motion intact.  Extensor mechanism is intact.  No groin pain with internal ex rotation of the leg.  5 out of 5 ankle dorsiflexion plantarflexion quad hamstring strength.  No other masses lymphadenopathy or skin changes noted in the knee region.  Specialty Comments:  No specialty comments available.  Imaging: No results found.   PMFS History: Patient Active Problem List   Diagnosis Date Noted   Osteoporosis 06/15/2020   Iron deficiency anemia 04/09/2020   Primary osteoarthritis involving multiple joints 12/31/2018   Rheumatoid arthritis involving multiple sites with positive rheumatoid factor ([place]) 12/31/2018   Irritable bowel syndrome with both constipation and diarrhea 10/28/2018   Constipation 03/14/2016   Mitral valve prolapse 11/23/2015   High cholesterol 11/23/2015   Past Medical History:  Diagnosis Date   Anemia    Anxiety    Arthritis    RA   Cancer (HCC)    Skin   CHF (congestive heart failure) (HCC)    Collagen vascular disease (HCC)    Depression    Endometriosis    High cholesterol    IBS (irritable bowel syndrome)    Lymphadenopathy    Mitral valve prolapse    RA (rheumatoid arthritis) (HCC)    Rotator cuff tear    bilateral  Uterus, adenomyosis     Family History  Problem Relation Age of Onset   Colon cancer Other    Bone cancer Mother    Brain cancer Father    Brain cancer Sister    Breast cancer Sister    Renal Disease Sister    Pancreatic cancer Neg Hx    Esophageal cancer Neg Hx    Stomach cancer Neg Hx    Rectal cancer Neg Hx     Past Surgical History:  Procedure Laterality Date   AXILLARY LYMPH NODE BIOPSY Right 10/12/2020   Procedure: RIGHT AXILLARY LYMPH NODE BIOPSY EXCISION;  Surgeon: Stark Klein, MD;  Location: [facility];  Service: General;  Laterality: Right;   BUNIONECTOMY Right     CARPAL TUNNEL RELEASE Right    COLONOSCOPY     COLONOSCOPY N/A 12/13/2015   Procedure: COLONOSCOPY;  Surgeon: Rogene Houston, MD;  [facility];  Service: Endoscopy;  Laterality: N/A;  9:55   Fatty tumor     2011 (left arm)   FOOT SURGERY     hammertoe   TONSILLECTOMY     TOTAL ABDOMINAL HYSTERECTOMY     2003   Social History   Occupational History   Occupation: retired  Tobacco Use   Smoking status: Some Days    Packs/day: 0.50    Years: 33.00    Pack years: 16.50    Types: Cigarettes   Smokeless tobacco: Never   Tobacco comments:    quit 9 yrs ago  Vaping Use   Vaping Use: Never used  Substance and Sexual Activity   Alcohol use: No    Alcohol/week: 0.0 standard drinks   Drug use: No   Sexual activity: Not Currently

## 2021-05-09 ENCOUNTER — Telehealth: Payer: Self-pay | Admitting: Family Medicine

## 2021-05-09 MED ORDER — ALENDRONATE SODIUM 70 MG PO TABS: 70.0000 mg | ORAL_TABLET | ORAL | 1 refills | Status: DC

## 2021-05-09 NOTE — Telephone Encounter (Signed)
Pt calling back to speak to [place]. Did not leave a note or message. The best call back number is [phone].

## 2021-05-09 NOTE — Telephone Encounter (Signed)
The patient is requesting a refill for a 90-day supply on dicyclomine 10 mg (takes qid prn). Also, she says Dr. Marlou Sa has said that her PCP would need to start her on an injection and tablets for osteoporosis. Please advise.

## 2021-05-09 NOTE — Telephone Encounter (Signed)
I called and advised the patient. She said she will try the fosamax and contact us if she has any problems tolerating it (with her IBS).

## 2021-05-09 NOTE — Telephone Encounter (Signed)
Patient called asked if a faxed was received from the pharmacy concerning the Rx Doxycycline? Patient asked if a note was received from Dr. Marlou Sa for her to get a vitamin D injection and a Rx for Tablets?  The  number to contact patient is [phone]

## 2021-05-10 NOTE — Telephone Encounter (Signed)
Spoke with pt already.

## 2021-05-11 ENCOUNTER — Telehealth: Payer: Self-pay | Admitting: Family Medicine

## 2021-05-11 NOTE — Telephone Encounter (Signed)
Patient called. She would like a call back. Her call back number is [phone]

## 2021-05-12 NOTE — Telephone Encounter (Signed)
I called and spoke with the patient - listened to her concerns about her rheumatoid arthritis and offered reassurance about her upcoming appointment at [facility] (on 05/17/21).

## 2021-05-13 ENCOUNTER — Telehealth: Payer: Self-pay | Admitting: Family Medicine

## 2021-05-13 ENCOUNTER — Other Ambulatory Visit: Payer: Self-pay | Admitting: Family Medicine

## 2021-05-13 MED ORDER — HYDROCODONE-ACETAMINOPHEN 5-325 MG PO TABS: 1.0000 | ORAL_TABLET | Freq: Every evening | ORAL | 0 refills | Status: DC | PRN

## 2021-05-13 NOTE — Telephone Encounter (Signed)
I called the patient and advised about the medication and the no need for further tests on the shoulders. I did advise her that the pain management facility had called her and left a message on 05/03/21, asking her to call back for an appointment. She will check her messages and give them a call.

## 2021-05-13 NOTE — Telephone Encounter (Signed)
Pt called stating she has an MRI set for 05/23/21 but her shoulder has been bothering her a lot and she's not sure if she'll need to do another diagnostic test. Pt also stated she's needing to have something different called in for pain because her current rx just makes her drowsy but doesn't help with pain. Pt would like Dr.Hilts to ask DR.Dean if there's anything he could think of for the pt to try. Pt would like a CB from [place] to discuss further.   [phone]

## 2021-05-13 NOTE — Telephone Encounter (Signed)
Please advise on this.  

## 2021-05-13 NOTE — Telephone Encounter (Signed)
She has hydrocodone and flexeril already.

## 2021-05-16 ENCOUNTER — Ambulatory Visit: Payer: BC Managed Care – PPO | Admitting: Podiatry

## 2021-05-18 ENCOUNTER — Other Ambulatory Visit: Payer: Self-pay

## 2021-05-18 ENCOUNTER — Encounter (HOSPITAL_COMMUNITY): Payer: Self-pay | Admitting: Hematology

## 2021-05-18 ENCOUNTER — Encounter: Payer: Self-pay | Admitting: "Endocrinology

## 2021-05-18 ENCOUNTER — Ambulatory Visit (INDEPENDENT_AMBULATORY_CARE_PROVIDER_SITE_OTHER): Payer: BC Managed Care – PPO | Admitting: "Endocrinology

## 2021-05-18 VITALS — BP 84/56 | HR 108 | Ht 60.0 in | Wt 74.0 lb

## 2021-05-18 DIAGNOSIS — R634 Abnormal weight loss: Secondary | ICD-10-CM | POA: Insufficient documentation

## 2021-05-18 DIAGNOSIS — E43 Unspecified severe protein-calorie malnutrition: Secondary | ICD-10-CM | POA: Insufficient documentation

## 2021-05-18 MED ORDER — PANCRELIPASE (LIP-PROT-AMYL) 12000-38000 UNITS PO CPEP: ORAL_CAPSULE | ORAL | 1 refills | Status: DC

## 2021-05-18 NOTE — Progress Notes (Signed)
Endocrinology Consult Note                                            05/18/2021, 5:41 PM   Subjective:    Patient ID: Vanessa Lara, female    DOB: May 09, 1956, PCP Eunice Blase, MD   Past Medical History:  Diagnosis Date   Anemia    Anxiety    Arthritis    RA   Cancer ([place])    Skin   CHF (congestive heart failure) ([place])    Collagen vascular disease ([place])    Depression    Endometriosis    High cholesterol    IBS (irritable bowel syndrome)    Lymphadenopathy    Mitral valve prolapse    RA (rheumatoid arthritis) ([place])    Rotator cuff tear    bilateral   Uterus, adenomyosis    Past Surgical History:  Procedure Laterality Date   AXILLARY LYMPH NODE BIOPSY Right 10/12/2020   Procedure: RIGHT AXILLARY LYMPH NODE BIOPSY EXCISION;  Surgeon: Stark Klein, MD;  Location: [facility];  Service: General;  Laterality: Right;   BUNIONECTOMY Right    CARPAL TUNNEL RELEASE Right    COLONOSCOPY     COLONOSCOPY N/A 12/13/2015   Procedure: COLONOSCOPY;  Surgeon: Rogene Houston, MD;  [facility];  Service: Endoscopy;  Laterality: N/A;  9:55   Fatty tumor     2011 (left arm)   FOOT SURGERY     [place]   TONSILLECTOMY     TOTAL ABDOMINAL HYSTERECTOMY     2003   Social History   Socioeconomic History   Marital status: Single    Spouse name: Not on file   Number of children: Not on file   Years of education: Not on file   Highest education level: Not on file  Occupational History   Occupation: retired  Tobacco Use   Smoking status: Some Days    Packs/day: 0.50    Years: 33.00    Pack years: 16.50    Types: Cigarettes   Smokeless tobacco: Never   Tobacco comments:    quit 9 yrs ago  Vaping Use   Vaping Use: Never used  Substance and Sexual Activity   Alcohol use: No    Alcohol/week: 0.0 standard drinks   Drug use: No   Sexual activity: Not Currently  Other Topics Concern   Not on file  Social History Narrative   Not on file    Social Determinants of Health   Financial Resource Strain: Not on file  Food Insecurity: Not on file  Transportation Needs: Not on file  Physical Activity: Not on file  Stress: Not on file  Social Connections: Not on file   Family History  Problem Relation Age of Onset   Colon cancer Other    Bone cancer Mother    Brain cancer Father    Brain cancer Sister    Breast cancer Sister    Renal Disease Sister    Pancreatic cancer Neg Hx    Esophageal cancer Neg Hx    Stomach cancer Neg Hx    Rectal cancer Neg Hx    Outpatient Encounter Medications as of 05/18/2021  Medication Sig   acetaminophen (TYLENOL) 500 MG tablet Take 500 mg by mouth every 6 (six) hours as needed.   alendronate (FOSAMAX) 70  MG tablet Take 1 tablet (70 mg total) by mouth once a week. Take with a full glass of water on an empty stomach.   cyclobenzaprine (FLEXERIL) 10 MG tablet Take 10 mg by mouth 3 (three) times daily as needed for muscle spasms.   HYDROcodone-acetaminophen (NORCO/VICODIN) 5-325 MG tablet Take 1 tablet by mouth at bedtime as needed for moderate pain.   lipase/protease/amylase (CREON) 12000-38000 units CPEP capsule Use 1 capsule with meals and snacks   atorvastatin (LIPITOR) 10 MG tablet Take 1 tablet (10 mg total) by mouth daily.   chlorzoxazone (PARAFON) 500 MG tablet TAKE 1/2 TABLET BY MOUTH FOUR TIMES DAILY AS NEEDED FOR MUSCLE SPASMS   dicyclomine (BENTYL) 10 MG capsule Take 1 capsule (10 mg total) by mouth 4 (four) times daily as needed for spasms.   gabapentin (NEURONTIN) 250 MG/5ML solution Take 2 mLs (100 mg total) by mouth at bedtime.   Heat Wraps (THERMACARE BACK/HIP) MISC 1 application by Does not apply route daily.   Hydroactive Dressings ([employer] HYDROCOLLOID PADS) PADS Apply 1 application topically daily.   hydroxychloroquine (PLAQUENIL) 200 MG tablet Take 200 mg by mouth every other day.   LORazepam (ATIVAN) 0.5 MG tablet 1/2 - 1 PO q HS prn   megestrol (MEGACE) 40 MG tablet Take 1  tablet (40 mg total) by mouth daily. (Patient not taking: Reported on 05/18/2021)   meloxicam (MOBIC) 15 MG tablet Take 0.5-1 tablets (7.5-15 mg total) by mouth daily as needed for pain.   oxyCODONE (OXY IR/ROXICODONE) 5 MG immediate release tablet Take 1-2 tablets (5-10 mg total) by mouth every 6 (six) hours as needed for severe pain. (Patient not taking: Reported on 05/18/2021)   pantoprazole (PROTONIX) 40 MG tablet Take 1 tablet (40 mg total) by mouth 2 (two) times daily. TAKE 1 TABLET(40 MG) BY MOUTH TWICE DAILY   PARoxetine (PAXIL) 10 MG tablet TAKE 1 TABLET(10 MG) BY MOUTH DAILY   predniSONE (DELTASONE) 10 MG tablet Take as directed for 12 days.  Daily dose 6,6,5,5,4,4,3,3,2,2,1,1. (Patient not taking: Reported on 05/18/2021)   promethazine (PHENERGAN) 25 MG tablet Take 0.5-1 tablets (12.5-25 mg total) by mouth every 6 (six) hours as needed for nausea or vomiting.   traZODone (DESYREL) 100 MG tablet Take 1 tablet (100 mg total) by mouth at bedtime as needed for sleep.   No facility-administered encounter medications on file as of 05/18/2021.   ALLERGIES: Allergies  Allergen Reactions   Abatacept Other (See Comments)   Codeine Other (See Comments)    Stomach pains/constipation- due to IBS   Golimumab Other (See Comments)   Hydrocodone Other (See Comments)    She does not want to take this due to intolerance to codeine.   Methylprednisolone Diarrhea and Other (See Comments)    "Made my IBS flare up"  Diarrhea, nausea   Methotrexate Derivatives Other (See Comments)    Agitation; pt stated, "I get no sleep; one dose caused no sleep for 7 days and 7 nights - that was when I got a 0.5 injection"   Prednisone Rash    Cannot take prednisone by mouth ---- red rash all over    VACCINATION STATUS:  There is no immunization history on file for this patient.  HPI Vanessa Lara is 65 y.o. female who presents today with a medical history as above. she is being seen in consultation for  unintentional weight loss requested by Vanessa, Legrand Como, MD.  This is her first visit.  History was obtained mainly from the patient  as well as chart review. She has a previous history of irritable bowel syndrome.  Review of her medical records shows that she is started to lose weight approximately 18 months ago.  In January 2020 she weighed 89 pounds from which point she progressively lost down to 72 pounds in April 2022.  She was found to have weight of 74 pounds today in his clinic.  Even though her EMR shows weight of 112 pounds in February 2019, patient disputes this and says her heaviest weight ever was 103 pounds.  She reports chronic diarrhea for which she has seen GI services with no clear diagnosis.  She denies thyroid, adrenal dysfunction.  She denies any swallowing problem.  She emphasizes the fact that she eats well, also selective.  She has a diagnosis of depression currently on lorazepam, trazodone, Paxil.  Patient was previously given Megace which she is not taking any longer.  She is not on active steroid treatment.  She has rheumatoid arthritis for which she is taking several medications including Plaquenil.  Reportedly, this is not affecting her cooking abilities. She denies any prior history of heavy alcohol use, malabsorption.  She typically runs low blood pressure, denies coronary artery disease , reports CHF.  She has high cholesterol. She denies any abdominal surgery, nor infections.   Review of Systems  Constitutional: + Progressive weight loss, no recent weight gain/loss, +fatigue, no subjective hyperthermia, + subjective hypothermia Eyes: no blurry vision, no xerophthalmia ENT: no sore throat, no nodules palpated in throat, no dysphagia/odynophagia, no hoarseness Cardiovascular: no Chest Pain, no Shortness of Breath, no palpitations, no leg swelling Respiratory: no cough, no shortness of breath Gastrointestinal: no Nausea/Vomiting/Diarhhea Musculoskeletal: + Diffuse  arthralgias,  Skin: no rashes Neurological: no tremors, no numbness, no tingling, no dizziness Psychiatric: no depression, no anxiety  Objective:    Vitals with BMI 05/18/2021 04/06/2021 01/25/2021  Height '5\' 0"'$  '5\' 0"'$  '5\' 0"'$   Weight 74 lbs 73 lbs 3 oz 72 lbs 13 oz  BMI 14.45 123456 [id number]  Systolic 84 - 123XX123  Diastolic 56 - 78  Pulse 123XX123 - 114    BP (!) 84/56   Pulse (!) 108   Ht 5' (1.524 m)   Wt 74 lb (33.6 kg)   BMI 14.45 kg/m   Wt Readings from Last 3 Encounters:  05/18/21 74 lb (33.6 kg)  04/06/21 73 lb 3.2 oz (33.2 kg)  01/25/21 72 lb 12.8 oz (33 kg)    Physical Exam  Constitutional:  Body mass index is 14.45 kg/m.,  Chronically sick looking.  Ambulates with a walker.   Eyes: PERRLA, EOMI, no exophthalmos ENT: moist mucous membranes, no gross thyromegaly, no gross cervical lymphadenopathy Cardiovascular: normal precordial activity, Regular Rate and Rhythm, no Murmur/Rubs/Gallops Respiratory:  adequate breathing efforts, no gross chest deformity, Clear to auscultation bilaterally Gastrointestinal: abdomen soft, Non -tender, No distension, Bowel Sounds present, no gross organomegaly Musculoskeletal: + Arthritic deformities on bilateral upper extremities,   suboptimal muscle strength on all of her  limbs.    Skin: moist, warm, no rashes, + coarse hair texture Neurological: no tremor with outstretched hands, Deep tendon reflexes normal in bilateral lower extremities.  CMP ( most recent) CMP     Component Value Date/Time   NA 137 01/25/2021 1140   NA 141 11/06/2019 1417   K 4.0 01/25/2021 1140   CL 99 01/25/2021 1140   CO2 29 01/25/2021 1140   GLUCOSE 92 01/25/2021 1140   BUN 9 01/25/2021 1140   BUN  10 11/06/2019 1417   CREATININE 0.61 01/25/2021 1140   CALCIUM 9.1 01/25/2021 1140   PROT 7.3 01/25/2021 1140   ALBUMIN 3.6 08/23/2020 1609   AST 15 01/25/2021 1140   AST 13 (L) 08/23/2020 1609   ALT 12 01/25/2021 1140   ALT 7 08/23/2020 1609   ALKPHOS 148 (H)  08/23/2020 1609   BILITOT 0.3 01/25/2021 1140   BILITOT 0.2 (L) 08/23/2020 1609   GFRNONAA >60 08/23/2020 1609   GFRAA 92 11/06/2019 1417     Diabetic Labs (most recent): Lab Results  Component Value Date   HGBA1C 5.7 (H) 03/19/2020   HGBA1C 5.5 11/06/2019     Lipid Panel ( most recent) Lipid Panel     Component Value Date/Time   CHOL 179 01/25/2021 1140   TRIG 128 01/25/2021 1140   HDL 52 01/25/2021 1140   CHOLHDL 3.4 01/25/2021 1140   LDLCALC 105 (H) 01/25/2021 1140      Lab Results  Component Value Date   TSH 2.34 05/15/2019       Assessment & Plan:   1. Loss of weight  2. Protein-calorie malnutrition, severe ([place]) 3.  Undiagnosed chronic diarrhea  - LOVIA FERRINI  is being seen at a kind request of Vanessa, Legrand Como, MD. - I have reviewed her available  records and clinically evaluated the patient. - Based on these reviews, she has undiagnosed chronic diarrhea with possible malnutrition leading to unintended weight loss. -She does not have a clear diagnosis to proceed with definitive treatment.  However, this patient may  benefit from Creon intervention which I have started her on today.  She will take a capsule of Creon with all meals and snacks.  I encouraged her to stay with her GI services for follow-up.  -She will need basic endocrine work-up including thyroid function, evaluation for adrenal sufficiency, vitamin D and B12 levels.  She will be supported with hormones if she has endocrine deficits. -This will be done on a.m. blood sample tomorrow.  She will also benefit from consulting with a dietitian which I will arrange for.    -even though she verbally reports that her food intake is adequate, this patient will need an aggressive nutritional rehabitation. -Patient is not reporting dysphagia, in fact reports adequate swallowing.  If that becomes an issue, may be considered for feeding mechanism in the future. She does not have recent abdominal imaging, may  need to update screening for malignancy after her baseline endocrine work-up is completed.  2019 abdominal CT showed aortic atherosclerosis.  She also has osteoporosis not on treatment, will not tolerate oral bisphosphonates.  She will continue to benefit from her walker for assisted walking.  She has high risk of fall/fracture.  After stabilization, she will be considered for parenteral options of bisphosphonates.  She had negative mammogram in 2021. -She is advised to continue follow-up with her rheumatologist regarding her rheumatoid arthritis.  - she is advised to maintain close follow up with Eunice Blase, MD for primary care needs.   - Time spent with the patient: 60 minutes, of which >50% was spent in  counseling her about her unintended weight loss, undiagnosed diarrhea, protein calorie normalization and the rest in obtaining information about her symptoms, reviewing her previous labs/studies ( including abstractions from other facilities),  evaluations, and treatments,  and developing a plan to confirm diagnosis and long term treatment based on the latest standards of care/guidelines; and documenting her care.  Vanessa Lara participated in the discussions, expressed  understanding, and voiced agreement with the above plans.  All questions were answered to her satisfaction. she is encouraged to contact clinic should she have any questions or concerns prior to her return visit.  Follow up plan: Return in about 1 week (around 05/25/2021) for F/U with Pre-visit Labs.   Glade Lloyd, MD [facility] [address] [place], [place] [address] Phone: [phone]  Fax: [phone]     05/18/2021, 5:41 PM  This note was partially dictated with voice recognition software. Similar sounding words can be transcribed inadequately or may not  be corrected upon review.

## 2021-05-19 DIAGNOSIS — M150 Primary generalized (osteo)arthritis: Secondary | ICD-10-CM | POA: Diagnosis not present

## 2021-05-19 DIAGNOSIS — M2550 Pain in unspecified joint: Secondary | ICD-10-CM | POA: Diagnosis not present

## 2021-05-19 DIAGNOSIS — M0579 Rheumatoid arthritis with rheumatoid factor of multiple sites without organ or systems involvement: Secondary | ICD-10-CM | POA: Diagnosis not present

## 2021-05-19 DIAGNOSIS — M79641 Pain in right hand: Secondary | ICD-10-CM | POA: Diagnosis not present

## 2021-05-19 DIAGNOSIS — R768 Other specified abnormal immunological findings in serum: Secondary | ICD-10-CM | POA: Diagnosis not present

## 2021-05-19 DIAGNOSIS — M79642 Pain in left hand: Secondary | ICD-10-CM | POA: Diagnosis not present

## 2021-05-20 DIAGNOSIS — M9901 Segmental and somatic dysfunction of cervical region: Secondary | ICD-10-CM | POA: Diagnosis not present

## 2021-05-20 DIAGNOSIS — M546 Pain in thoracic spine: Secondary | ICD-10-CM | POA: Diagnosis not present

## 2021-05-20 DIAGNOSIS — M542 Cervicalgia: Secondary | ICD-10-CM | POA: Diagnosis not present

## 2021-05-20 DIAGNOSIS — M9902 Segmental and somatic dysfunction of thoracic region: Secondary | ICD-10-CM | POA: Diagnosis not present

## 2021-05-21 ENCOUNTER — Other Ambulatory Visit: Payer: Self-pay | Admitting: Family Medicine

## 2021-05-23 ENCOUNTER — Ambulatory Visit (HOSPITAL_COMMUNITY)
Admission: RE | Admit: 2021-05-23 | Discharge: 2021-05-23 | Disposition: A | Discharge status: Home / Self Care | Payer: BC Managed Care – PPO | Source: Ambulatory Visit | Attending: Family Medicine | Admitting: Family Medicine

## 2021-05-23 ENCOUNTER — Other Ambulatory Visit: Payer: Self-pay

## 2021-05-23 DIAGNOSIS — M1711 Unilateral primary osteoarthritis, right knee: Secondary | ICD-10-CM | POA: Insufficient documentation

## 2021-05-23 DIAGNOSIS — M5116 Intervertebral disc disorders with radiculopathy, lumbar region: Secondary | ICD-10-CM | POA: Diagnosis not present

## 2021-05-23 DIAGNOSIS — M4696 Unspecified inflammatory spondylopathy, lumbar region: Secondary | ICD-10-CM | POA: Diagnosis not present

## 2021-05-23 DIAGNOSIS — M5441 Lumbago with sciatica, right side: Secondary | ICD-10-CM

## 2021-05-23 DIAGNOSIS — M23222 Derangement of posterior horn of medial meniscus due to old tear or injury, left knee: Secondary | ICD-10-CM | POA: Diagnosis not present

## 2021-05-23 DIAGNOSIS — M25461 Effusion, right knee: Secondary | ICD-10-CM | POA: Diagnosis not present

## 2021-05-23 DIAGNOSIS — M23221 Derangement of posterior horn of medial meniscus due to old tear or injury, right knee: Secondary | ICD-10-CM | POA: Diagnosis not present

## 2021-05-23 DIAGNOSIS — N281 Cyst of kidney, acquired: Secondary | ICD-10-CM | POA: Diagnosis not present

## 2021-05-23 DIAGNOSIS — M4726 Other spondylosis with radiculopathy, lumbar region: Secondary | ICD-10-CM | POA: Diagnosis not present

## 2021-05-23 DIAGNOSIS — M25462 Effusion, left knee: Secondary | ICD-10-CM | POA: Diagnosis not present

## 2021-05-23 DIAGNOSIS — R600 Localized edema: Secondary | ICD-10-CM | POA: Diagnosis not present

## 2021-05-23 DIAGNOSIS — M1712 Unilateral primary osteoarthritis, left knee: Secondary | ICD-10-CM | POA: Diagnosis not present

## 2021-05-24 ENCOUNTER — Telehealth: Payer: Self-pay | Admitting: Family Medicine

## 2021-05-24 NOTE — Telephone Encounter (Signed)
Patient called. She would like a refill on pain medications. Her call back number is [phone]

## 2021-05-24 NOTE — Telephone Encounter (Signed)
Lumbar MRI scan shows mild arthritis changes but no nerve impingement, no compression fractures.  No indication for surgery.  Right knee MRI scan shows mild arthritis.  Left knee MRI scan shows a tear of the medial meniscus cartilage as well as moderate arthritis.  Could contemplate discussing surgical options for the left knee with Dr. Ninfa Linden or Marlou Sa.

## 2021-05-25 NOTE — Telephone Encounter (Signed)
I called and reached the patient's voice mail -- left message to call me back regarding the MRI results and to address the pain medication request (in a separate message).

## 2021-05-25 NOTE — Telephone Encounter (Signed)
I called the patient earlier this afternoon about her MRI results and to discuss the meds - and ask about the pain management referral status - has she called them back to schedule the appointment ----- reached her voice mail. Left message to call me back. See other message with the MRI results included.

## 2021-05-26 ENCOUNTER — Encounter: Payer: Self-pay | Admitting: Physical Medicine & Rehabilitation

## 2021-05-26 ENCOUNTER — Telehealth: Payer: Self-pay | Admitting: Family Medicine

## 2021-05-26 NOTE — Telephone Encounter (Signed)
I called and advised the patient of her MRI results. She has an appointment with Lurena Joiner on 9/02 for MRI review. She does need a consult for her left knee medial meniscus tear. Can Lurena Joiner do this or does she need to be scheduled with Dr. Marlou Sa? She is asking to be seen sooner than next Friday, if possible.

## 2021-05-26 NOTE — Telephone Encounter (Signed)
Patient called asked for a call back concerning her pain medication. Patient said the medicine just makes her sleep and when she wakes up she is still in a lot of pain. Patient said she is hurting in her joints. Patient said she had her MRI done and would like to know the results. The number to contact patient is [phone]

## 2021-05-26 NOTE — Telephone Encounter (Signed)
Pt called stating she has an appt for pain management in October and wanted to keep Terri up to date with everything she's doing.

## 2021-05-26 NOTE — Telephone Encounter (Signed)
I called the patient and advised her of the MRI results of the Lsp and both knees, and the need for surgical consult for the meniscal tear. I advised the patient she needs to call the pain management clinic back to schedule the new patient consult ASAP, before they close out the referral. I gave her the phone number and she said she will call them right away. Also, she will call her pharmacy because she is unsure if she picked up the Rx for hydrocodone from 05/13/21 or not --- she cannot find her bottle and she assumed she was just out of it. She is still taking her meloxicam and can take tylenol in place of the hydrocodone if it is making her too sleepy (no more than 2 every 6 hours).  I asked the patient to give me a call back once she is given an appointment date/time with pain management. She asked that I call her back by 3 pm tomorrow, if she does not call me before then.

## 2021-05-27 DIAGNOSIS — M542 Cervicalgia: Secondary | ICD-10-CM | POA: Diagnosis not present

## 2021-05-27 DIAGNOSIS — M9902 Segmental and somatic dysfunction of thoracic region: Secondary | ICD-10-CM | POA: Diagnosis not present

## 2021-05-27 DIAGNOSIS — M9901 Segmental and somatic dysfunction of cervical region: Secondary | ICD-10-CM | POA: Diagnosis not present

## 2021-05-27 DIAGNOSIS — R634 Abnormal weight loss: Secondary | ICD-10-CM | POA: Diagnosis not present

## 2021-05-27 DIAGNOSIS — M546 Pain in thoracic spine: Secondary | ICD-10-CM | POA: Diagnosis not present

## 2021-05-27 MED ORDER — HYDROCODONE-ACETAMINOPHEN 5-325 MG PO TABS: 1.0000 | ORAL_TABLET | Freq: Every evening | ORAL | 0 refills | Status: DC | PRN

## 2021-05-27 NOTE — Telephone Encounter (Signed)
I called the patient: she has an appointment for pain management consult on 07/28/21. She will be seeing Lurena Joiner on 06/03/21 about her knee. The patient is asking for a refill on the hydrocodone to take for severe pain. Today, she is completely without any. She is still taking the meloxicam and cyclobenzaprine. Please advise.

## 2021-05-27 NOTE — Telephone Encounter (Signed)
I called and advised the patient. 

## 2021-05-27 NOTE — Telephone Encounter (Signed)
I called -- see other message regarding her pain medication request.

## 2021-05-30 LAB — TSH: TSH: 3.34 u[IU]/mL (ref 0.450–4.500)

## 2021-05-30 LAB — T3, FREE: T3, Free: 3.1 pg/mL (ref 2.0–4.4)

## 2021-05-30 LAB — T4, FREE: Free T4: 1.29 ng/dL (ref 0.82–1.77)

## 2021-05-30 LAB — VITAMIN D 25 HYDROXY (VIT D DEFICIENCY, FRACTURES): Vit D, 25-Hydroxy: 33.7 ng/mL (ref 30.0–100.0)

## 2021-05-30 LAB — CORTISOL-AM, BLOOD: Cortisol - AM: 8 ug/dL (ref 6.2–19.4)

## 2021-05-30 LAB — VITAMIN B12: Vitamin B-12: 361 pg/mL (ref 232–1245)

## 2021-05-31 ENCOUNTER — Other Ambulatory Visit: Payer: Self-pay

## 2021-05-31 ENCOUNTER — Ambulatory Visit (INDEPENDENT_AMBULATORY_CARE_PROVIDER_SITE_OTHER): Payer: BC Managed Care – PPO | Admitting: "Endocrinology

## 2021-05-31 ENCOUNTER — Encounter: Payer: Self-pay | Admitting: "Endocrinology

## 2021-05-31 VITALS — BP 103/66 | HR 109 | Ht 60.0 in | Wt 72.0 lb

## 2021-05-31 DIAGNOSIS — R634 Abnormal weight loss: Secondary | ICD-10-CM | POA: Diagnosis not present

## 2021-05-31 DIAGNOSIS — E43 Unspecified severe protein-calorie malnutrition: Secondary | ICD-10-CM | POA: Diagnosis not present

## 2021-05-31 MED ORDER — PREDNISONE 5 MG PO TABS: 5.0000 mg | ORAL_TABLET | Freq: Every day | ORAL | 3 refills | Status: DC

## 2021-05-31 MED ORDER — PANCRELIPASE (LIP-PROT-AMYL) 12000-38000 UNITS PO CPEP: ORAL_CAPSULE | ORAL | 1 refills | Status: DC

## 2021-05-31 NOTE — Progress Notes (Signed)
05/31/2021, 6:15 PM  Endocrinology follow-up note   Subjective:    Patient ID: Vanessa Lara, female    DOB: [birth year], PCP Vanessa Blase, MD   Past Medical History:  Diagnosis Date   Anemia    Anxiety    Arthritis    RA   Cancer ([facility]essa Lara)    Skin   CHF (congestive heart failure) ([place])    Collagen vascular disease ([place])    Depression    Endometriosis    High cholesterol    IBS (irritable bowel syndrome)    Lymphadenopathy    Mitral valve prolapse    RA (rheumatoid arthritis) ([facility])    Rotator cuff tear    bilateral   Uterus, adenomyosis    Past Surgical History:  Procedure Laterality Date   AXILLARY LYMPH NODE BIOPSY Right 10/12/2020   Procedure: RIGHT AXILLARY LYMPH NODE BIOPSY EXCISION;  Surgeon: Vanessa Klein, MD;  Location: [facility];  Service: General;  Laterality: Right;   BUNIONECTOMY Right    CARPAL TUNNEL RELEASE Right    COLONOSCOPY     COLONOSCOPY N/A 12/13/2015   Procedure: COLONOSCOPY;  Surgeon: Vanessa Houston, MD;  Location: [facility];  Service: Endoscopy;  Laterality: N/A;  9:55   Fatty tumor     2011 (left arm)   FOOT SURGERY     [place]   TONSILLECTOMY     TOTAL ABDOMINAL HYSTERECTOMY     2003   Social History   Socioeconomic History   Marital status: Single    Spouse name: Not on file   Number of children: Not on file   Years of education: Not on file   Highest education level: Not on file  Occupational History   Occupation: retired  Tobacco Use   Smoking status: Some Days    Packs/day: 0.50    Years: 33.00    Pack years: 16.50    Types: Cigarettes   Smokeless tobacco: Never   Tobacco comments:    quit 9 yrs ago  Vaping Use   Vaping Use: Never used  Substance and Sexual Activity   Alcohol use: No    Alcohol/week: 0.0 standard drinks   Drug use: No   Sexual activity: Not Currently  Other Topics Concern   Not on file  Social History Narrative   Not on file    Social Determinants of Health   Financial Resource Strain: Not on file  Food Insecurity: Not on file  Transportation Needs: Not on file  Physical Activity: Not on file  Stress: Not on file  Social Connections: Not on file   Family History  Problem Relation Age of Onset   Colon cancer Other    Bone cancer Mother    Brain cancer Father    Brain cancer Sister    Breast cancer Sister    Renal Disease Sister    Pancreatic cancer Neg Hx    Esophageal cancer Neg Hx    Stomach cancer Neg Hx    Rectal cancer Neg Hx    Outpatient Encounter Medications as of 05/31/2021  Medication Sig   alendronate (FOSAMAX) 70 MG tablet Take 1 tablet (70 mg total) by mouth once a week. Take with a full glass of water on  an empty stomach.   predniSONE (DELTASONE) 5 MG tablet Take 1 tablet (5 mg total) by mouth daily with breakfast.   acetaminophen (TYLENOL) 500 MG tablet Take 500 mg by mouth every 6 (six) hours as needed.   atorvastatin (LIPITOR) 10 MG tablet Take 1 tablet (10 mg total) by mouth daily.   chlorzoxazone (PARAFON) 500 MG tablet TAKE 1/2 TABLET BY MOUTH FOUR TIMES DAILY AS NEEDED FOR MUSCLE SPASMS   cyclobenzaprine (FLEXERIL) 10 MG tablet TAKE 1 TABLET(10 MG) BY MOUTH THREE TIMES DAILY AS NEEDED FOR MUSCLE SPASMS   dicyclomine (BENTYL) 10 MG capsule Take 1 capsule (10 mg total) by mouth 4 (four) times daily as needed for spasms.   gabapentin (NEURONTIN) 250 MG/5ML solution Take 2 mLs (100 mg total) by mouth at bedtime.   Heat Wraps (THERMACARE BACK/HIP) MISC 1 application by Does not apply route daily.   Hydroactive Dressings ([employer] HYDROCOLLOID PADS) PADS Apply 1 application topically daily.   HYDROcodone-acetaminophen (NORCO/VICODIN) 5-325 MG tablet Take 1 tablet by mouth at bedtime as needed for moderate pain.   lipase/protease/amylase (CREON) 12000-38000 units CPEP capsule Use 1 capsule with meals and snacks   LORazepam (ATIVAN) 0.5 MG tablet TAKE 1/2 TO 1 TABLET BY MOUTH AT BEDTIME AS  NEEDED   megestrol (MEGACE) 40 MG tablet Take 1 tablet (40 mg total) by mouth daily. (Patient not taking: Reported on 05/18/2021)   meloxicam (MOBIC) 15 MG tablet TAKE 1/2 TO 1 TABLET(7.5 TO 15 MG) BY MOUTH DAILY AS NEEDED FOR PAIN   pantoprazole (PROTONIX) 40 MG tablet Take 1 tablet (40 mg total) by mouth 2 (two) times daily. TAKE 1 TABLET(40 MG) BY MOUTH TWICE DAILY   PARoxetine (PAXIL) 10 MG tablet TAKE 1 TABLET(10 MG) BY MOUTH DAILY   promethazine (PHENERGAN) 25 MG tablet Take 0.5-1 tablets (12.5-25 mg total) by mouth every 6 (six) hours as needed for nausea or vomiting.   traZODone (DESYREL) 100 MG tablet Take 1 tablet (100 mg total) by mouth at bedtime as needed for sleep.   [DISCONTINUED] hydroxychloroquine (PLAQUENIL) 200 MG tablet Take 200 mg by mouth every other day.   [DISCONTINUED] lipase/protease/amylase (CREON) 12000-38000 units CPEP capsule Use 1 capsule with meals and snacks   [DISCONTINUED] oxyCODONE (OXY IR/ROXICODONE) 5 MG immediate release tablet Take 1-2 tablets (5-10 mg total) by mouth every 6 (six) hours as needed for severe pain. (Patient not taking: Reported on 05/18/2021)   [DISCONTINUED] predniSONE (DELTASONE) 10 MG tablet Take as directed for 12 days.  Daily dose 6,6,5,5,4,4,3,3,2,2,1,1. (Patient not taking: Reported on 05/18/2021)   No facility-administered encounter medications on file as of 05/31/2021.   ALLERGIES: Allergies  Allergen Reactions   Abatacept Other (See Comments)   Codeine Other (See Comments)    Stomach pains/constipation- due to IBS   Golimumab Other (See Comments)   Hydrocodone Other (See Comments)    She does not want to take this due to intolerance to codeine.   Methylprednisolone Diarrhea and Other (See Comments)    "Made my IBS flare up"  Diarrhea, nausea   Methotrexate Derivatives Other (See Comments)    Agitation; pt stated, "I get no sleep; one dose caused no sleep for 7 days and 7 nights - that was when I got a 0.5 injection"    Prednisone Rash    Cannot take prednisone by mouth ---- red rash all over    VACCINATION STATUS:  There is no immunization history on file for this patient.  HPI Vanessa Lara is 65  y.o. female who presents today with a medical history as above. she is being seen in follow-up after she was seen in consultation for unintentional weight loss requested by Vanessa Blase, MD.   See notes from her last visit.  She was supposed to pick up and start Creon.  She has not gone to the pharmacy to get her Creon.  She is presenting with 2 more pounds of weight loss.   History was obtained mainly from the patient as well as chart review. She has a previous history of irritable bowel syndrome.  Review of her medical records shows that she is started to lose weight approximately 18 months ago.  In January 2020 she weighed 89 pounds from which point she progressively lost down to 72 pounds in April 2022.  She was found to have weight of 74 pounds today in his clinic.  Even though her EMR shows weight of 112 pounds in February 2019, patient disputes this and says her heaviest weight ever was 103 pounds.  She reports chronic diarrhea for which she has seen GI services with no clear diagnosis.  She denies thyroid, adrenal dysfunction.  She denies any swallowing problem.  She emphasizes the fact that she eats well, also selective.  She has a diagnosis of depression currently on lorazepam, trazodone, Paxil.  Patient was previously given Megace which she is not taking any longer.  She is not on active steroid treatment.  She has rheumatoid arthritis for which she is taking several medications including Plaquenil.  Reportedly, this is not affecting her cooking abilities. She denies any prior history of heavy alcohol use, malabsorption.  She typically runs low blood pressure, denies coronary artery disease , reports CHF.  She has high cholesterol. She denies any abdominal surgery, nor infections.   Review of  Systems  Constitutional: + Progressive weight loss, no recent weight gain/loss, +fatigue, no subjective hyperthermia, + subjective hypothermia   Objective:    Vitals with BMI 05/31/2021 05/18/2021 04/06/2021  Height '5\' 0"'$  '5\' 0"'$  '5\' 0"'$   Weight 72 lbs 74 lbs 73 lbs 3 oz  BMI 14.06 99991111 [id number]  Systolic [id number] 84 -  Diastolic 66 56 -  Pulse 0000000 108 -    BP 103/66   Pulse (!) 109   Ht 5' (1.524 m)   Wt 72 lb (32.7 kg)   BMI 14.06 kg/m   Wt Readings from Last 3 Encounters:  05/31/21 72 lb (32.7 kg)  05/18/21 74 lb (33.6 kg)  04/06/21 73 lb 3.2 oz (33.2 kg)    Physical Exam  Constitutional:  Body mass index is 14.06 kg/m.,  Chronically sick looking.  Ambulates with a walker.   Eyes: PERRLA, EOMI, no exophthalmos ENT: moist mucous membranes, no gross thyromegaly, no gross cervical lymphadenopathy Cardiovascular: normal precordial activity, Regular Rate and Rhythm, no Murmur/Rubs/Gallops Respiratory:  adequate breathing efforts, no gross chest deformity, Clear to auscultation bilaterally Gastrointestinal: abdomen soft, Non -tender, No distension, Bowel Sounds present, no gross organomegaly Musculoskeletal: + Arthritic deformities on bilateral upper extremities,   suboptimal muscle strength on all of her  limbs.    Skin: moist, warm, no rashes, + coarse hair texture Neurological: no tremor with outstretched hands, Deep tendon reflexes normal in bilateral lower extremities.  CMP ( most recent) CMP     Component Value Date/Time   NA 137 01/25/2021 1140   NA 141 11/06/2019 1417   K 4.0 01/25/2021 1140   CL 99 01/25/2021 1140   CO2 29 01/25/2021 1140  GLUCOSE 92 01/25/2021 1140   BUN 9 01/25/2021 1140   BUN 10 11/06/2019 1417   CREATININE 0.61 01/25/2021 1140   CALCIUM 9.1 01/25/2021 1140   PROT 7.3 01/25/2021 1140   ALBUMIN 3.6 08/23/2020 1609   AST 15 01/25/2021 1140   AST 13 (L) 08/23/2020 1609   ALT 12 01/25/2021 1140   ALT 7 08/23/2020 1609   ALKPHOS 148 (H) 08/23/2020  1609   BILITOT 0.3 01/25/2021 1140   BILITOT 0.2 (L) 08/23/2020 1609   GFRNONAA >60 08/23/2020 1609   GFRAA 92 11/06/2019 1417     Diabetic Labs (most recent): Lab Results  Component Value Date   HGBA1C 5.7 (H) 03/19/2020   HGBA1C 5.5 11/06/2019     Lipid Panel ( most recent) Lipid Panel     Component Value Date/Time   CHOL 179 01/25/2021 1140   TRIG 128 01/25/2021 1140   HDL 52 01/25/2021 1140   CHOLHDL 3.4 01/25/2021 1140   LDLCALC 105 (H) 01/25/2021 1140      Lab Results  Component Value Date   TSH 3.340 05/27/2021   TSH 2.34 05/15/2019   FREET4 1.29 05/27/2021       Assessment & Plan:   1. Loss of weight  2. Protein-calorie malnutrition, severe ([place]) 3.  Undiagnosed chronic diarrhea   - I have reviewed her available  records and clinically evaluated the patient. - Based on these reviews, she has undiagnosed chronic diarrhea with possible malnutrition leading to unintended weight loss. -She does not have a clear diagnosis to proceed with definitive treatment.  However, this patient may  benefit from Creon intervention.this prescription was initiated for her during her last visit, unfortunately she has not picked up this medication.  I discussed and reemphasized the need for empiric treatment with Creon with her today.   She agrees with plan.  She will take a capsule of Creon with all meals and snacks.  I encouraged her to stay with her GI services for follow-up.  -Her basic endocrine work-up rules out vitamin B12, vitamin D, thyroid dysfunctions.  Her a.m. cortisol was marginal at 8.  She may benefit from low-dose prednisone.  I discussed initiated prednisone 5 mg p.o. daily at breakfast for her.     She will also benefit from consulting with a dietitian which I will arrange for.    -even though she verbally reports that her food intake is adequate, this patient will need an aggressive nutritional rehabitation. -Patient is not reporting dysphagia, in fact  reports adequate swallowing.  If that becomes an issue, may be considered for feeding mechanism in the future. She does not have recent abdominal imaging, may need to update screening for malignancy after her baseline endocrine work-up is completed.  2019 abdominal CT showed aortic atherosclerosis.  She also has osteoporosis not on treatment, will not tolerate oral bisphosphonates.  She will continue to benefit from her walker for assisted walking.  She has high risk of fall/fracture.  After stabilization, she will be considered for parenteral options of bisphosphonates.  She had negative mammogram in 2021. -She is advised to continue follow-up with her rheumatologist regarding her rheumatoid arthritis.  - she is advised to maintain close follow up with Vanessa Blase, MD for primary care needs.   I spent 33 minutes in the care of the patient today including review of labs from Thyroid Function, CMP, and other relevant labs ; imaging/biopsy records (current and previous including abstractions from other facilities); face-to-face time discussing  her  lab results and symptoms, medications doses, her options of short and long term treatment based on the latest standards of care / guidelines;   and documenting the encounter.  Vanessa Lara  participated in the discussions, expressed understanding, and voiced agreement with the above plans.  All questions were answered to her satisfaction. she is encouraged to contact clinic should she have any questions or concerns prior to her return visit.   Follow up plan: Return in about 3 weeks (around 06/21/2021) for F/U with no Labs.   Glade Lloyd, MD [facility] [address] [place], [place] [address] Phone: [phone]  Fax: [phone]     05/31/2021, 6:15 PM  This note was partially dictated with voice recognition software. Similar sounding words can be transcribed inadequately or may not  be  corrected upon review.

## 2021-06-01 ENCOUNTER — Telehealth: Payer: Self-pay

## 2021-06-01 ENCOUNTER — Telehealth: Payer: Self-pay | Admitting: "Endocrinology

## 2021-06-01 NOTE — Telephone Encounter (Signed)
Discussed with pt that we sent in information to start the PA for her creon. Understanding voiced.

## 2021-06-01 NOTE — Telephone Encounter (Signed)
Pt is calling for Joy and states that she contacted the pharmacy and they are saying the Creon is needing a prior authorization. She is wanting to speak with Joy. [phone]

## 2021-06-01 NOTE — Telephone Encounter (Signed)
PA for creon started and sent.

## 2021-06-02 NOTE — Progress Notes (Signed)
Office Visit Note   Patient: Vanessa Lara           Date of Birth: 07/16/1956           MRN: [id number] Visit Date: 06/03/2021 Requested by: Eunice Blase, MD No address on file PCP: Eunice Blase, MD  Subjective: No chief complaint on file.   HPI: Vanessa Lara is a 65 y.o. female who presents to the office for MRI review. Patient denies any changes in symptoms.  Continues to complain mainly of medial sided left and right knee pain.  Denies any back pain or radicular pain down her legs.  Denies any significant groin pain.  Most of her pain is in the left knee but the right knee bothers her a decent amount.  Denies any mechanical locking symptoms but does report that both knees feel like they give out on her on occasion.  She is using Ace wraps on both knees with some relief of her symptoms.  She also takes meloxicam and Flexeril to help with the pain with occasional use of hydrocodone if symptoms are unbearable.  She is being followed by endocrinology and has just started Creon for malnutrition.    Additionally, Dr. Junius Roads was consulted regarding recommended treatment for osteoporosis based on her DEXA scan and he suggested Forteo or a bisphosphonate.  She was started on alendronate by Dr. Junius Roads on 05/09/2021.  She will also start vitamin D and calcium supplementation.  MRI results revealed: MR Lumbar Spine w/o contrast  Result Date: 05/24/2021 CLINICAL DATA:  Lumbar radiculopathy EXAM: MRI LUMBAR SPINE WITHOUT CONTRAST TECHNIQUE: Multiplanar, multisequence MR imaging of the lumbar spine was performed. No intravenous contrast was administered. COMPARISON:  10/03/2018. FINDINGS: Segmentation:  Standard. Alignment: Straightening of the normal lumbar lordosis. No listhesis. Vertebrae:  No fracture, evidence of discitis, or bone lesion. Conus medullaris and cauda equina: Conus extends to the L1-L2 level. Conus and cauda equina appear normal. Paraspinal and other soft tissues: Multiple right  renal cysts. Disc levels: T12-L1: No significant disc bulge. No spinal canal stenosis or neural foraminal narrowing. L1-L2: No significant disc bulge. No spinal canal stenosis or neural foraminal narrowing. L2-L3: No significant disc bulge. No spinal canal stenosis or neural foraminal narrowing. L3-L4: Mild disc bulge. Mild facet arthropathy. No spinal canal stenosis. No neural foraminal narrowing. L4-L5: Mild disc bulge. Mild facet arthropathy. No spinal canal stenosis or neural foraminal narrowing. Minimal disc bulge. Mild right greater than left facet arthropathy. No spinal canal stenosis or neural foraminal narrowing. L5-S1: No significant disc bulge. No spinal canal stenosis or neural foraminal narrowing. IMPRESSION: Mild degenerative changes without spinal canal stenosis or neural foraminal narrowing. Electronically Signed   By: Merilyn Baba M.D.   On: 05/24/2021 14:24   MR Knee Right w/o contrast  Result Date: 05/24/2021 CLINICAL DATA:  Knee pain, chronic, negative xray (Age >= 5y) EXAM: MRI OF THE RIGHT KNEE WITHOUT CONTRAST TECHNIQUE: Multiplanar, multisequence MR imaging of the knee was performed. No intravenous contrast was administered. COMPARISON:  Right knee MRI 01/10/2021 FINDINGS: MENISCI Medial: Nondisplaced undersurface fraying/degenerative tearing at the posterior horn-body junction of the medial meniscus, new from prior exam. Lateral: Intact. LIGAMENTS Cruciates: ACL and PCL are intact. Collaterals: Medial collateral ligament is intact. Lateral collateral ligament complex is intact. CARTILAGE Patellofemoral:  Mild cartilaginous thinning without focal defect. Medial: Near full-thickness cartilage loss over the central/inner weight-bearing medial femoral condyle. Lateral:  Mild cartilage thinning without focal defect. JOINT: Small joint effusion with synovitis. POPLITEAL FOSSA:  No Baker's cyst. EXTENSOR MECHANISM: Intact quadriceps tendon. Intact patellar tendon. BONES: Tiny tricompartment  degenerative change. Subchondral marrow edema in the medial femoral condyle and cystic change. No acute fracture or dislocation. No aggressive osseous lesion. Other: None IMPRESSION: Nondisplaced undersurface fraying/degenerative tearing at the posterior horn-body junction of the medial meniscus, new from prior exam. Unchanged full-thickness cartilage loss over the central/inner weight-bearing medial femoral condyle. Increased adjacent subchondral bony edema. Small joint effusion with synovitis. Electronically Signed   By: Maurine Simmering M.D.   On: 05/24/2021 16:12   MR Knee Left w/o contrast  Result Date: 05/24/2021 CLINICAL DATA:  Knee pain, chronic, negative xray (Age >= 5y) EXAM: MRI OF THE LEFT KNEE WITHOUT CONTRAST TECHNIQUE: Multiplanar, multisequence MR imaging of the knee was performed. No intravenous contrast was administered. COMPARISON:  Left knee MRI 01/10/2021. FINDINGS: MENISCI Medial: There is a nondisplaced degenerative tearing of the posterior horn medial meniscus (sagittal PD images 6 and 7), new from prior exam Lateral: Intact. LIGAMENTS Cruciates: ACL and PCL are intact. Collaterals: Medial collateral ligament is intact. Lateral collateral ligament complex is intact. CARTILAGE Patellofemoral: Mild diffuse cartilage thinning without focal defect. Medial: Diffuse cartilage thinning with large area of near full-thickness cartilage loss along the weight-bearing medial femoral condyle. Unchanged subchondral bone irregularity centrally (series 6, image 10). Lateral:  No chondral defect. JOINT: Small joint effusion with synovitis. POPLITEAL FOSSA: Miniscule Baker cyst. EXTENSOR MECHANISM: Intact quadriceps tendon. Intact patellar tendon. BONES: Increased bone marrow edema along the lateral femoral condyle in comparison prior exam. Increased bony edema along the subchondral medial femoral condyle. Other: No additional findings. IMPRESSION: Nondisplaced degenerative tearing of the posterior horn medial  meniscus, new from prior exam. Medial predominant osteoarthritis with unchanged area of near full-thickness cartilage loss and unchanged small osteochondral defect along the weight-bearing medial femoral condyle. Increased adjacent reactive subchondral marrow edema. Increased bony edema within the lateral femoral condyle in comparison to prior exam. This could represent a developing bony infarct or insufficiency fracture. Consider follow-up MRI in 3-6 months if clinically indicated. Small joint effusion with synovitis. Electronically Signed   By: Maurine Simmering M.D.   On: 05/24/2021 16:04                 ROS: All systems reviewed are negative as they relate to the chief complaint within the history of present illness.  Patient denies fevers or chills.  Assessment & Plan: Visit Diagnoses:  1. Unilateral primary osteoarthritis, left knee   2. Unilateral primary osteoarthritis, right knee     Plan: VIRGIA TACK is a 65 y.o. female who presents to the office for review of MRI lumbar spine, MRI of the left knee, MRI right knee.  She has recently started alendronate for osteoporosis and is also going to start vitamin D and calcium supplementation.  She has no significant findings on MRI lumbar spine which was reviewed with her today.  Both knee MRI scans were reviewed as well.  Right knee MRI scan shows full-thickness cartilage loss in the medial compartment primarily with some mild degenerative changes in the patellofemoral and lateral compartments.  There is a nondisplaced degenerative tear of the medial meniscus in the right knee but with her already present arthritic changes, doubt that arthroscopy would be a predictably beneficial intervention.    Left knee MRI scan was reviewed as well which showed a nondisplaced medial meniscus tear as well.  She also has medial compartment arthritis with osteochondral defect and subchondral edema of the medial  femoral condyle.  There is bony edema in the lateral  femoral condyle without any significant degenerative changes.  This is new since prior exam so plan to keep an eye on her lateral sided pain in the left knee with consideration for further MRI if her pain continues to worsen for evaluation of insufficiency fracture.  Discussed the options available to patient.  After discussion, she would like to see how she does with her new medications (alendronate, Creon, vitamin D/calcium supplementation) and return in 4 weeks for clinical recheck.  There is combination of knee pathology as well as reduced bone quality that is responsible for her knee pain.  If she has no significant improvement from her new medication regimen at her next appointment, plan to try injections into both knee joints with possibility for physical therapy at that point.  Patient agreed with this plan.  Follow-Up Instructions: No follow-ups on file.   Orders:  No orders of the defined types were placed in this encounter.  No orders of the defined types were placed in this encounter.     Procedures: No procedures performed   Clinical Data: No additional findings.  Objective: Vital Signs: There were no vitals taken for this visit.  Physical Exam:  Constitutional: Patient appears well-developed HEENT:  Head: Normocephalic Eyes:EOM are normal Neck: Normal range of motion Cardiovascular: Normal rate Pulmonary/chest: Effort normal Neurologic: Patient is alert Skin: Skin is warm Psychiatric: Patient has normal mood and affect  Ortho Exam: Ortho exam demonstrates bilateral knees with trace effusion.  Tenderness over the medial joint lines of both knees with some tenderness over the lateral joint line of the left knee but no such tenderness in the right knee.  No pain with hip range of motion.  Able to perform straight leg raise with decent quad strength  Specialty Comments:  No specialty comments available.  Imaging: No results found.   PMFS History: Patient Active  Problem List   Diagnosis Date Noted   Loss of weight 05/18/2021   Protein-calorie malnutrition, severe ([place]) 05/18/2021   Osteoporosis 06/15/2020   Iron deficiency anemia 04/09/2020   Primary osteoarthritis involving multiple joints 12/31/2018   Rheumatoid arthritis involving multiple sites with positive rheumatoid factor ([place]) 12/31/2018   Irritable bowel syndrome with both constipation and diarrhea 10/28/2018   Constipation 03/14/2016   Mitral valve prolapse 11/23/2015   High cholesterol 11/23/2015   Past Medical History:  Diagnosis Date   Anemia    Anxiety    Arthritis    RA   Cancer (HCC)    Skin   CHF (congestive heart failure) (HCC)    Collagen vascular disease (HCC)    Depression    Endometriosis    High cholesterol    IBS (irritable bowel syndrome)    Lymphadenopathy    Mitral valve prolapse    RA (rheumatoid arthritis) (HCC)    Rotator cuff tear    bilateral   Uterus, adenomyosis     Family History  Problem Relation Age of Onset   Colon cancer Other    Bone cancer Mother    Brain cancer Father    Brain cancer Sister    Breast cancer Sister    Renal Disease Sister    Pancreatic cancer Neg Hx    Esophageal cancer Neg Hx    Stomach cancer Neg Hx    Rectal cancer Neg Hx     Past Surgical History:  Procedure Laterality Date   AXILLARY LYMPH NODE BIOPSY Right 10/12/2020  Procedure: RIGHT AXILLARY LYMPH NODE BIOPSY EXCISION;  Surgeon: Stark Klein, MD;  Location: [place];  Service: General;  Laterality: Right;   BUNIONECTOMY Right    CARPAL TUNNEL RELEASE Right    COLONOSCOPY     COLONOSCOPY N/A 12/13/2015   Procedure: COLONOSCOPY;  Surgeon: Rogene Houston, MD;  [facility];  Service: Endoscopy;  Laterality: N/A;  9:55   Fatty tumor     2011 (left arm)   FOOT SURGERY     hammertoe   TONSILLECTOMY     TOTAL ABDOMINAL HYSTERECTOMY     2003   Social History   Occupational History   Occupation: retired  Tobacco Use    Smoking status: Some Days    Packs/day: 0.50    Years: 33.00    Pack years: 16.50    Types: Cigarettes   Smokeless tobacco: Never   Tobacco comments:    quit 9 yrs ago  Vaping Use   Vaping Use: Never used  Substance and Sexual Activity   Alcohol use: No    Alcohol/week: 0.0 standard drinks   Drug use: No   Sexual activity: Not Currently

## 2021-06-03 ENCOUNTER — Ambulatory Visit (INDEPENDENT_AMBULATORY_CARE_PROVIDER_SITE_OTHER): Payer: BC Managed Care – PPO | Admitting: Surgical

## 2021-06-03 DIAGNOSIS — M170 Bilateral primary osteoarthritis of knee: Secondary | ICD-10-CM

## 2021-06-03 DIAGNOSIS — M1711 Unilateral primary osteoarthritis, right knee: Secondary | ICD-10-CM | POA: Diagnosis not present

## 2021-06-03 DIAGNOSIS — M1712 Unilateral primary osteoarthritis, left knee: Secondary | ICD-10-CM

## 2021-06-04 ENCOUNTER — Encounter: Payer: Self-pay | Admitting: Surgical

## 2021-06-07 ENCOUNTER — Other Ambulatory Visit: Payer: Self-pay | Admitting: Radiology

## 2021-06-07 MED ORDER — BACLOFEN 10 MG PO TABS: ORAL_TABLET | ORAL | 0 refills | Status: DC

## 2021-06-07 NOTE — Telephone Encounter (Signed)
Refill request on Baclofen '10mg'$  tablet take 0.5-1 tablet tid prn muscle spasms #30. Please advise.

## 2021-06-08 ENCOUNTER — Ambulatory Visit: Payer: BC Managed Care – PPO | Admitting: Physician Assistant

## 2021-06-08 ENCOUNTER — Telehealth: Payer: Self-pay | Admitting: "Endocrinology

## 2021-06-08 NOTE — Telephone Encounter (Signed)
Pt is calling and states can she get an order to get her Iron checked as she is getting weaker, and wants to know if her iron could be low or if its just her body wearing down. She does not have a pcp at this time/

## 2021-06-08 NOTE — Telephone Encounter (Signed)
Discussed with pt, she stated she does not see her new PCP until Sept 19th and that she's experiencing worsening weakness and feels like she needs her ferritin levels checked and possibly an iron infusion. Advised pt to go to the ER to be evaluated. Dr.Nida made aware.

## 2021-06-13 ENCOUNTER — Other Ambulatory Visit: Payer: Self-pay | Admitting: Radiology

## 2021-06-13 MED ORDER — PAROXETINE HCL 10 MG PO TABS: ORAL_TABLET | ORAL | 1 refills | Status: DC

## 2021-06-13 NOTE — Telephone Encounter (Signed)
Refill request on Dr. Junius Roads patient. Sending to you as you are on practice call. Thanks.

## 2021-06-13 NOTE — Telephone Encounter (Signed)
Spoke with pt to let her know we have faxed more information to [employer] to see if we can get her Rx for creon approved. Understanding voiced.

## 2021-06-13 NOTE — Telephone Encounter (Signed)
Joy, patient requesting a call back from you

## 2021-06-14 DIAGNOSIS — M7989 Other specified soft tissue disorders: Secondary | ICD-10-CM | POA: Diagnosis not present

## 2021-06-14 DIAGNOSIS — M150 Primary generalized (osteo)arthritis: Secondary | ICD-10-CM | POA: Diagnosis not present

## 2021-06-14 DIAGNOSIS — M0579 Rheumatoid arthritis with rheumatoid factor of multiple sites without organ or systems involvement: Secondary | ICD-10-CM | POA: Diagnosis not present

## 2021-06-14 DIAGNOSIS — R5382 Chronic fatigue, unspecified: Secondary | ICD-10-CM | POA: Diagnosis not present

## 2021-06-14 DIAGNOSIS — R768 Other specified abnormal immunological findings in serum: Secondary | ICD-10-CM | POA: Diagnosis not present

## 2021-06-15 ENCOUNTER — Encounter: Payer: Self-pay | Admitting: Orthopedic Surgery

## 2021-06-15 ENCOUNTER — Ambulatory Visit (INDEPENDENT_AMBULATORY_CARE_PROVIDER_SITE_OTHER): Payer: BC Managed Care – PPO | Admitting: Orthopedic Surgery

## 2021-06-15 ENCOUNTER — Other Ambulatory Visit: Payer: Self-pay | Admitting: "Endocrinology

## 2021-06-15 DIAGNOSIS — M1711 Unilateral primary osteoarthritis, right knee: Secondary | ICD-10-CM | POA: Diagnosis not present

## 2021-06-15 DIAGNOSIS — M75112 Incomplete rotator cuff tear or rupture of left shoulder, not specified as traumatic: Secondary | ICD-10-CM

## 2021-06-15 DIAGNOSIS — R911 Solitary pulmonary nodule: Secondary | ICD-10-CM | POA: Insufficient documentation

## 2021-06-15 MED ORDER — TRAMADOL HCL 50 MG PO TABS: 50.0000 mg | ORAL_TABLET | Freq: Two times a day (BID) | ORAL | 0 refills | Status: DC | PRN

## 2021-06-15 MED ORDER — FORTEO 600 MCG/2.4ML ~~LOC~~ SOPN: 20.0000 ug | PEN_INJECTOR | Freq: Every day | SUBCUTANEOUS | 0 refills | Status: DC

## 2021-06-15 NOTE — Progress Notes (Signed)
Office Visit Note   Patient: Vanessa Lara           Date of Birth: 1956-06-19           MRN: [id number] Visit Date: 06/15/2021 Requested by: Eunice Blase, MD No address on file PCP: Eunice Blase, MD  Subjective: Chief Complaint  Patient presents with   Left Shoulder - Pain   Right Shoulder - Pain   Left Knee - Pain    HPI: Vanessa Lara is a 65 y.o. female who presents to the office complaining of bilateral shoulder and bilateral knee pain.  Left shoulder bothering her more than her right shoulder and is the primary source of her pain today.  She denies any recent injuries and states that the pain she has chronically had in the joints is more so than normal.  Localizes most of her pain to the lateral and axillary aspects of the left shoulder.  Denies any history of diabetes.  She has run out of her hydrocodone prescription from Dr. Junius Roads.  She has no pain management appointment until the end of October and requests pain medicine to bridge to this appointment.  She also is concerned about starting alendronate which Dr. Junius Roads prescribed for her as she is aware that it has potential for GI side effects and she has history of IBS.  She also is concerned about becoming hyperactive from any cortisone injections she receives but reports that the last injections of Depo-Medrol she received from Dr. Junius Roads in her shoulders did not give her the symptoms so she is okay with this medication..                ROS: All systems reviewed are negative as they relate to the chief complaint within the history of present illness.  Patient denies fevers or chills.  Assessment & Plan: Visit Diagnoses:  1. Incomplete rotator cuff tear or rupture of left shoulder, not specified as traumatic   2. Unilateral primary osteoarthritis, right knee     Plan: Patient is a 65 year old female who presents complaining of bilateral shoulder and bilateral knee pain.  She has history of chronic pain as well as  bilateral knee osteoarthritis which were reviewed at her last appointment.  She also has history of bilateral shoulder pain with MRIs from earlier this year that demonstrated rotator cuff tears of the supraspinatus of both shoulders as well as infraspinatus tendinosis.  Discussed options available to patient.  She would like to proceed with injections into the most bothersome of her joints today.  Given that she wants to avoid any hyperactivity side effects, plan to inject only 2 joints today.  Left shoulder and right knee were injected per her request with Depo-Medrol.  Also prescribed tramadol every 12h as needed to bridge to gap between now and her pain management appointment.  Plan to switch from alendronate to [facility] for bone health.  Follow-up next week for right shoulder and left knee injection.  Follow-Up Instructions: No follow-ups on file.   Orders:  No orders of the defined types were placed in this encounter.  No orders of the defined types were placed in this encounter.     Procedures: Large Joint Inj: L subacromial bursa on 06/15/2021 9:22 PM Indications: diagnostic evaluation and pain Details: 18 G 1.5 in needle, posterior approach  Arthrogram: No  Medications: 9 mL bupivacaine 0.5 %; 40 mg methylPREDNISolone acetate 40 MG/ML; 5 mL lidocaine 1 % Outcome: tolerated well, no immediate complications Procedure,  treatment alternatives, risks and benefits explained, specific risks discussed. Consent was given by the patient. Immediately prior to procedure a time out was called to verify the correct patient, procedure, equipment, support staff and site/side marked as required. Patient was prepped and draped in the usual sterile fashion.    Large Joint Inj: R knee on 06/15/2021 9:22 PM Indications: diagnostic evaluation, joint swelling and pain Details: 18 G 1.5 in needle, superolateral approach  Arthrogram: No  Medications: 5 mL lidocaine 1 %; 40 mg methylPREDNISolone acetate 40  MG/ML; 4 mL bupivacaine 0.25 % Outcome: tolerated well, no immediate complications Procedure, treatment alternatives, risks and benefits explained, specific risks discussed. Consent was given by the patient. Immediately prior to procedure a time out was called to verify the correct patient, procedure, equipment, support staff and site/side marked as required. Patient was prepped and draped in the usual sterile fashion.      Clinical Data: No additional findings.  Objective: Vital Signs: There were no vitals taken for this visit.  Physical Exam:  Constitutional: Patient appears well-developed HEENT:  Head: Normocephalic Eyes:EOM are normal Neck: Normal range of motion Cardiovascular: Normal rate Pulmonary/chest: Effort normal Neurologic: Patient is alert Skin: Skin is warm Psychiatric: Patient has normal mood and affect  Ortho Exam: Ortho exam demonstrates right shoulder with 85 degrees external rotation, 70 degrees abduction, 135 degrees forward flexion.  This compared with the left shoulder with 90 degrees external rotation, 65 degrees abduction, 120 degrees forward flexion.  Excellent rotator cuff strength rated 5/5 of supra, infra, subscap of bilateral shoulders.  Right knee with 5 degrees extension and 120 degrees of knee flexion.  No effusion noted.  Mild joint line tenderness over medial and lateral joint lines.  Left knee with 0 degrees extension and 115 degrees of knee flexion.  No effusion present.  Mild joint line tenderness over the medial joint line.  Extensor mechanism intact with both knees.  No pain with hip range of motion of both knees.  Specialty Comments:  No specialty comments available.  Imaging: No results found.   PMFS History: Patient Active Problem List   Diagnosis Date Noted   Loss of weight 05/18/2021   Protein-calorie malnutrition, severe ([place]) 05/18/2021   Osteoporosis 06/15/2020   Iron deficiency anemia 04/09/2020   Primary osteoarthritis  involving multiple joints 12/31/2018   Rheumatoid arthritis involving multiple sites with positive rheumatoid factor ([place]) 12/31/2018   Irritable bowel syndrome with both constipation and diarrhea 10/28/2018   Constipation 03/14/2016   Mitral valve prolapse 11/23/2015   High cholesterol 11/23/2015   Past Medical History:  Diagnosis Date   Anemia    Anxiety    Arthritis    RA   Cancer (HCC)    Skin   CHF (congestive heart failure) (HCC)    Collagen vascular disease (HCC)    Depression    Endometriosis    High cholesterol    IBS (irritable bowel syndrome)    Lymphadenopathy    Mitral valve prolapse    RA (rheumatoid arthritis) (HCC)    Rotator cuff tear    bilateral   Uterus, adenomyosis     Family History  Problem Relation Age of Onset   Colon cancer Other    Bone cancer Mother    Brain cancer Father    Brain cancer Sister    Breast cancer Sister    Renal Disease Sister    Pancreatic cancer Neg Hx    Esophageal cancer Neg Hx    Stomach cancer  Neg Hx    Rectal cancer Neg Hx     Past Surgical History:  Procedure Laterality Date   AXILLARY LYMPH NODE BIOPSY Right 10/12/2020   Procedure: RIGHT AXILLARY LYMPH NODE BIOPSY EXCISION;  Surgeon: Stark Klein, MD;  Location: [place];  Service: General;  Laterality: Right;   BUNIONECTOMY Right    CARPAL TUNNEL RELEASE Right    COLONOSCOPY     COLONOSCOPY N/A 12/13/2015   Procedure: COLONOSCOPY;  Surgeon: Rogene Houston, MD;  [facility];  Service: Endoscopy;  Laterality: N/A;  9:55   Fatty tumor     2011 (left arm)   FOOT SURGERY     hammertoe   TONSILLECTOMY     TOTAL ABDOMINAL HYSTERECTOMY     2003   Social History   Occupational History   Occupation: retired  Tobacco Use   Smoking status: Some Days    Packs/day: 0.50    Years: 33.00    Pack years: 16.50    Types: Cigarettes   Smokeless tobacco: Never   Tobacco comments:    quit 9 yrs ago  Vaping Use   Vaping Use: Never used   Substance and Sexual Activity   Alcohol use: No    Alcohol/week: 0.0 standard drinks   Drug use: No   Sexual activity: Not Currently

## 2021-06-15 NOTE — Telephone Encounter (Signed)
Discussed with pt, understanding voiced. 

## 2021-06-15 NOTE — Telephone Encounter (Signed)
Pt called and wants to know if there is a substitute medicine she can be prescribed to take in place while she is waiting for this to be approved

## 2021-06-15 NOTE — Telephone Encounter (Signed)
Left a message requesting a return call to the office. 

## 2021-06-16 ENCOUNTER — Telehealth: Payer: Self-pay | Admitting: Surgical

## 2021-06-16 NOTE — Telephone Encounter (Signed)
Patient called stating she has a Rx written by Mendel Ryder for (Baclofen) 10 mg. Patient said she is not sure if that's the same medication that [facility] prescribed for her and is sending to the pharmacy. Patient asked if Lurena Joiner can call her concerning the pain medication. Patient said she is not trying to get double pain medicine. The number to contact patient is [phone]

## 2021-06-17 ENCOUNTER — Telehealth: Payer: Self-pay | Admitting: Surgical

## 2021-06-17 ENCOUNTER — Other Ambulatory Visit: Payer: Self-pay | Admitting: Surgical

## 2021-06-17 ENCOUNTER — Ambulatory Visit: Payer: BC Managed Care – PPO | Admitting: Surgical

## 2021-06-17 MED ORDER — LORAZEPAM 0.5 MG PO TABS: ORAL_TABLET | ORAL | 0 refills | Status: DC

## 2021-06-17 NOTE — Telephone Encounter (Signed)
I called and discussed. May need prior auth for Forteo according to her

## 2021-06-17 NOTE — Telephone Encounter (Signed)
Patient called advised her left knee is popping and feel like it is going to pop out of place. Patient asked what can she do until she come to her appointment next week. The number to patient is [phone]

## 2021-06-18 NOTE — Telephone Encounter (Signed)
Called and discussed

## 2021-06-19 ENCOUNTER — Encounter: Payer: Self-pay | Admitting: Orthopedic Surgery

## 2021-06-19 MED ORDER — LIDOCAINE HCL 1 % IJ SOLN
5.0000 mL | INTRAMUSCULAR | Status: AC | PRN
  Administered 2021-06-15: 5 mL

## 2021-06-19 MED ORDER — METHYLPREDNISOLONE ACETATE 40 MG/ML IJ SUSP
40.0000 mg | INTRAMUSCULAR | Status: AC | PRN
  Administered 2021-06-15: 40 mg via INTRA_ARTICULAR

## 2021-06-19 MED ORDER — BUPIVACAINE HCL 0.25 % IJ SOLN
4.0000 mL | INTRAMUSCULAR | Status: AC | PRN
  Administered 2021-06-15: 4 mL via INTRA_ARTICULAR

## 2021-06-19 MED ORDER — BUPIVACAINE HCL 0.5 % IJ SOLN
9.0000 mL | INTRAMUSCULAR | Status: AC | PRN
  Administered 2021-06-15: 9 mL via INTRA_ARTICULAR

## 2021-06-21 DIAGNOSIS — I700 Atherosclerosis of aorta: Secondary | ICD-10-CM | POA: Diagnosis not present

## 2021-06-21 DIAGNOSIS — E43 Unspecified severe protein-calorie malnutrition: Secondary | ICD-10-CM | POA: Diagnosis not present

## 2021-06-21 DIAGNOSIS — K582 Mixed irritable bowel syndrome: Secondary | ICD-10-CM | POA: Diagnosis not present

## 2021-06-21 DIAGNOSIS — M0579 Rheumatoid arthritis with rheumatoid factor of multiple sites without organ or systems involvement: Secondary | ICD-10-CM | POA: Diagnosis not present

## 2021-06-21 DIAGNOSIS — R636 Underweight: Secondary | ICD-10-CM | POA: Diagnosis not present

## 2021-06-22 ENCOUNTER — Ambulatory Visit: Payer: BC Managed Care – PPO | Admitting: "Endocrinology

## 2021-06-24 ENCOUNTER — Ambulatory Visit (INDEPENDENT_AMBULATORY_CARE_PROVIDER_SITE_OTHER): Payer: BC Managed Care – PPO | Admitting: Surgical

## 2021-06-24 ENCOUNTER — Ambulatory Visit: Payer: Self-pay

## 2021-06-24 ENCOUNTER — Encounter: Payer: Self-pay | Admitting: Surgical

## 2021-06-24 ENCOUNTER — Other Ambulatory Visit: Payer: Self-pay

## 2021-06-24 DIAGNOSIS — R1011 Right upper quadrant pain: Secondary | ICD-10-CM

## 2021-06-24 DIAGNOSIS — M546 Pain in thoracic spine: Secondary | ICD-10-CM | POA: Diagnosis not present

## 2021-06-24 NOTE — Progress Notes (Signed)
Office Visit Note   Patient: Vanessa Lara           Date of Birth: [birth year]           MRN: [id number] Visit Date: 06/24/2021 Requested by: Eunice Blase, MD No address on file PCP: Eunice Blase, MD  Subjective: Chief Complaint  Patient presents with   Right Shoulder - Follow-up   Left Knee - Follow-up    HPI: Vanessa Lara is a 65 y.o. female who presents to the office complaining of abdominal pain.  She also is here for right shoulder and left knee cortisone injections as planned at her last appointment.  She states that she has had increasing right upper quadrant abdominal pain that is worse after she eats meals.  She has associated bloating but this is typical for her with her history of IBS.  Denies any nausea or vomiting.  She does not feel ill but does complain of pain just below her rib cage.  She has no history of prior issues with her gallbladder or any prior gallbladder surgery.  She denies any significant thoracic spine pain or pain that radiates around her flank or thorax.              ROS: All systems reviewed are negative as they relate to the chief complaint within the history of present illness.  Patient denies fevers or chills.  Assessment & Plan: Visit Diagnoses:  1. Pain in thoracic spine   2. Right upper quadrant pain     Plan: Patient has returned to the office for right shoulder and left knee injections.  Tolerated the injections well.  She has new complaint of increasing right upper quadrant pain.  She thought it was rib pain but she has no real tenderness over her rib cage today on exam and no findings on radiographs.  She does have tenderness underneath the rib cage in the right upper quadrant.  She has right upper quadrant pain in this location that gets worse after eating meals.  Ordered right upper quadrant ultrasound and patient will follow-up with her primary care physician for further evaluation.  She will also call her pharmacy to work on  obtaining the Forteo that was prescribed at the last office visit.  She wants to discontinue alendronate as she was concerned about GI upset.  She has not actually started this medication.  Follow-up with the office as needed with next injections that we can do about 3 months from now.  Follow-Up Instructions: No follow-ups on file.   Orders:  Orders Placed This Encounter  Procedures   FAST [place]   XR Thoracic Spine 2 View   No orders of the defined types were placed in this encounter.     Procedures: No procedures performed   Clinical Data: No additional findings.  Objective: Vital Signs: There were no vitals taken for this visit.  Physical Exam:  Constitutional: Patient appears well-developed HEENT:  Head: Normocephalic Eyes:EOM are normal Neck: Normal range of motion Cardiovascular: Normal rate Pulmonary/chest: Effort normal Neurologic: Patient is alert Skin: Skin is warm Psychiatric: Patient has normal mood and affect Abdominal: Tenderness in the right upper quadrant.  No rebound tenderness.  No guarding.  Tenderness in the right lower quadrant, left lower quadrant, left upper quadrant.  Ortho Exam: Ortho exam demonstrates left knee without effusion.  Reasonable range of motion of the left knee and right shoulder.  Functional range of motion intact with patient able to lift her arm  overhead.  No tenderness over her right inferior ribs anteriorly.  No sternal tenderness.  No tenderness throughout the axial thoracic spine.  Specialty Comments:  No specialty comments available.  Imaging: No results found.   PMFS History: Patient Active Problem List   Diagnosis Date Noted   Loss of weight 05/18/2021   Protein-calorie malnutrition, severe ([place]) 05/18/2021   Osteoporosis 06/15/2020   Iron deficiency anemia 04/09/2020   Primary osteoarthritis involving multiple joints 12/31/2018   Rheumatoid arthritis involving multiple sites with positive rheumatoid factor ([place])  12/31/2018   Irritable bowel syndrome with both constipation and diarrhea 10/28/2018   Constipation 03/14/2016   Mitral valve prolapse 11/23/2015   High cholesterol 11/23/2015   Past Medical History:  Diagnosis Date   Anemia    Anxiety    Arthritis    RA   Cancer (HCC)    Skin   CHF (congestive heart failure) (HCC)    Collagen vascular disease (HCC)    Depression    Endometriosis    High cholesterol    IBS (irritable bowel syndrome)    Lymphadenopathy    Mitral valve prolapse    RA (rheumatoid arthritis) (HCC)    Rotator cuff tear    bilateral   Uterus, adenomyosis     Family History  Problem Relation Age of Onset   Colon cancer Other    Bone cancer Mother    Brain cancer Father    Brain cancer Sister    Breast cancer Sister    Renal Disease Sister    Pancreatic cancer Neg Hx    Esophageal cancer Neg Hx    Stomach cancer Neg Hx    Rectal cancer Neg Hx     Past Surgical History:  Procedure Laterality Date   AXILLARY LYMPH NODE BIOPSY Right 10/12/2020   Procedure: RIGHT AXILLARY LYMPH NODE BIOPSY EXCISION;  Surgeon: Stark Klein, MD;  Location: [facility];  Service: General;  Laterality: Right;   BUNIONECTOMY Right    CARPAL TUNNEL RELEASE Right    COLONOSCOPY     COLONOSCOPY N/A 12/13/2015   Procedure: COLONOSCOPY;  Surgeon: Rogene Houston, MD;  [facility];  Service: Endoscopy;  Laterality: N/A;  9:55   Fatty tumor     2011 (left arm)   FOOT SURGERY     hammertoe   TONSILLECTOMY     TOTAL ABDOMINAL HYSTERECTOMY     2003   Social History   Occupational History   Occupation: retired  Tobacco Use   Smoking status: Some Days    Packs/day: 0.50    Years: 33.00    Pack years: 16.50    Types: Cigarettes   Smokeless tobacco: Never   Tobacco comments:    quit 9 yrs ago  Vaping Use   Vaping Use: Never used  Substance and Sexual Activity   Alcohol use: No    Alcohol/week: 0.0 standard drinks   Drug use: No   Sexual activity:  Not Currently

## 2021-06-27 DIAGNOSIS — M542 Cervicalgia: Secondary | ICD-10-CM | POA: Diagnosis not present

## 2021-06-27 DIAGNOSIS — M9901 Segmental and somatic dysfunction of cervical region: Secondary | ICD-10-CM | POA: Diagnosis not present

## 2021-06-27 DIAGNOSIS — M9902 Segmental and somatic dysfunction of thoracic region: Secondary | ICD-10-CM | POA: Diagnosis not present

## 2021-06-27 DIAGNOSIS — M546 Pain in thoracic spine: Secondary | ICD-10-CM | POA: Diagnosis not present

## 2021-06-29 ENCOUNTER — Telehealth: Payer: Self-pay | Admitting: Surgical

## 2021-06-29 NOTE — Telephone Encounter (Signed)
Patient called advised the Rx for [facility]) need prior approval before the pharmacy will fill the Rx. Patient said the Rx will need to go to the [facility] speciality pharmacy. The number to contact patient is [phone]

## 2021-07-01 NOTE — Telephone Encounter (Signed)
Pt called again regarding the message below

## 2021-07-04 ENCOUNTER — Other Ambulatory Visit: Payer: Self-pay

## 2021-07-04 ENCOUNTER — Ambulatory Visit: Payer: BC Managed Care – PPO | Admitting: "Endocrinology

## 2021-07-04 ENCOUNTER — Ambulatory Visit: Payer: BC Managed Care – PPO | Admitting: Orthopedic Surgery

## 2021-07-04 ENCOUNTER — Telehealth: Payer: Self-pay | Admitting: *Deleted

## 2021-07-04 DIAGNOSIS — R1011 Right upper quadrant pain: Secondary | ICD-10-CM

## 2021-07-04 DIAGNOSIS — M9902 Segmental and somatic dysfunction of thoracic region: Secondary | ICD-10-CM | POA: Diagnosis not present

## 2021-07-04 DIAGNOSIS — M9901 Segmental and somatic dysfunction of cervical region: Secondary | ICD-10-CM | POA: Diagnosis not present

## 2021-07-04 DIAGNOSIS — M542 Cervicalgia: Secondary | ICD-10-CM | POA: Diagnosis not present

## 2021-07-04 DIAGNOSIS — M546 Pain in thoracic spine: Secondary | ICD-10-CM | POA: Diagnosis not present

## 2021-07-04 NOTE — Progress Notes (Signed)
Cardiology Office Note    Date:  07/18/2021   ID:  Vanessa, Lara 10-10-1955, MRN [id number]   PCP:  Pablo Lawrence, NP   [place]  Cardiologist:  None   Advanced Practice Provider:  No care team member to display Electrophysiologist:  None   [id number]   Chief Complaint  Patient presents with   Follow-up     History of Present Illness:  Vanessa Lara is a 65 y.o. female with history of nonischemic cardiomyopathy ejection fraction 55 to 60% on echo in 2019, history of atypical chest pain low risk NST 11/2017, HLD, RA.  Patient last saw Ms. Strader 2019, complaining of palpitations and reported history of atrial fibrillation.  30-day monitor ordered but never done.  Patient comes in for f/u. Has severe arthritis, has someone stay with her incase she falls. It hurts to walk.She is concerned because one of her CT scans 12/2020 showed mild plaque in the thoracic aorta and calcification in LAD.  Denies chest tightness, pressure, dyspnea. Has palpitations when she is exhausted and laying in bed. Says she's probably in Afib at times but we have never documented.Today is tachycardic but in a lot of pain. BP up today but often 96/60.  Past Medical History:  Diagnosis Date   Anemia    Anxiety    Arthritis    RA   Cancer (HCC)    Skin   CHF (congestive heart failure) (HCC)    Collagen vascular disease (HCC)    Depression    Endometriosis    High cholesterol    IBS (irritable bowel syndrome)    Lymphadenopathy    Mitral valve prolapse    RA (rheumatoid arthritis) (HCC)    Rotator cuff tear    bilateral   Uterus, adenomyosis     Past Surgical History:  Procedure Laterality Date   AXILLARY LYMPH NODE BIOPSY Right 10/12/2020   Procedure: RIGHT AXILLARY LYMPH NODE BIOPSY EXCISION;  Surgeon: Stark Klein, MD;  Location: [facility];  Service: General;  Laterality: Right;   BUNIONECTOMY Right    CARPAL TUNNEL RELEASE Right     COLONOSCOPY     COLONOSCOPY N/A 12/13/2015   Procedure: COLONOSCOPY;  Surgeon: Rogene Houston, MD;  [facility];  Service: Endoscopy;  Laterality: N/A;  9:55   Fatty tumor     2011 (left arm)   FOOT SURGERY     hammertoe   TONSILLECTOMY     TOTAL ABDOMINAL HYSTERECTOMY     2003    Current Medications: Current Meds  Medication Sig   acetaminophen (TYLENOL) 500 MG tablet Take 500 mg by mouth every 6 (six) hours as needed.   atorvastatin (LIPITOR) 10 MG tablet Take 1 tablet (10 mg total) by mouth daily.   baclofen (LIORESAL) 10 MG tablet TAKE 1/2 TO 1 TABLET BY MOUTH THREE TIMES DAILY AS NEEDED FOR MUSCLE SPASMS   dicyclomine (BENTYL) 10 MG capsule Take 1 capsule (10 mg total) by mouth 4 (four) times daily as needed for spasms.   gabapentin (NEURONTIN) 250 MG/5ML solution Take 2 mLs (100 mg total) by mouth at bedtime.   Heat Wraps (THERMACARE BACK/HIP) MISC 1 application by Does not apply route daily.   Hydroactive Dressings ([employer] HYDROCOLLOID PADS) PADS Apply 1 application topically daily.   lipase/protease/amylase (CREON) 12000-38000 units CPEP capsule Use 1 capsule with meals and snacks   megestrol (MEGACE) 40 MG tablet Take 1 tablet (40 mg total) by  mouth daily.   meloxicam (MOBIC) 15 MG tablet TAKE 1/2 TO 1 TABLET(7.5 TO 15 MG) BY MOUTH DAILY AS NEEDED FOR PAIN   pantoprazole (PROTONIX) 40 MG tablet Take 1 tablet (40 mg total) by mouth 2 (two) times daily. TAKE 1 TABLET(40 MG) BY MOUTH TWICE DAILY   PARoxetine (PAXIL) 10 MG tablet TAKE 1 TABLET(10 MG) BY MOUTH DAILY   Teriparatide, Recombinant, (FORTEO) 600 MCG/2.4ML SOPN Inject 20 mcg into the skin daily.   traMADol (ULTRAM) 50 MG tablet Take 1-2 tablets (50-100 mg total) by mouth every 12 (twelve) hours as needed.   traZODone (DESYREL) 100 MG tablet Take 1 tablet (100 mg total) by mouth at bedtime as needed for sleep.     Allergies:   Abatacept, Codeine, Golimumab, Hydrocodone, Methylprednisolone, Methotrexate  derivatives, and Prednisone   Social History   Socioeconomic History   Marital status: Single    Spouse name: Not on file   Number of children: Not on file   Years of education: Not on file   Highest education level: Not on file  Occupational History   Occupation: retired  Tobacco Use   Smoking status: Former    Packs/day: 0.50    Years: 33.00    Pack years: 16.50    Types: Cigarettes   Smokeless tobacco: Never   Tobacco comments:    quit 9 yrs ago  Vaping Use   Vaping Use: Never used  Substance and Sexual Activity   Alcohol use: No    Alcohol/week: 0.0 standard drinks   Drug use: No   Sexual activity: Not Currently  Other Topics Concern   Not on file  Social History Narrative   Not on file   Social Determinants of Health   Financial Resource Strain: Not on file  Food Insecurity: Not on file  Transportation Needs: Not on file  Physical Activity: Not on file  Stress: Not on file  Social Connections: Not on file     Family History:  The patient's  family history includes Bone cancer in her mother; Brain cancer in her father and sister; Breast cancer in her sister; Colon cancer in an other family member; Renal Disease in her sister.   ROS:   Please see the history of present illness.    ROS All other systems reviewed and are negative.   PHYSICAL EXAM:   VS:  BP 140/90   Pulse (!) 106   Ht 5' (1.524 m)   Wt 74 lb 9.6 oz (33.8 kg)   SpO2 99%   BMI 14.57 kg/m   Physical Exam  GEN: Well nourished, well developed, in no acute distress  Neck: no JVD, carotid bruits, or masses Cardiac:RRR; no murmurs, rubs, or gallops  Respiratory:  clear to auscultation bilaterally, normal work of breathing GI: soft, nontender, nondistended, + BS Ext: without cyanosis, clubbing, or edema, Good distal pulses bilaterally Neuro:  Alert and Oriented x 3 Psych: euthymic mood, full affect  Wt Readings from Last 3 Encounters:  07/18/21 74 lb 9.6 oz (33.8 kg)  05/31/21 72 lb (32.7  kg)  05/18/21 74 lb (33.6 kg)      Studies/Labs Reviewed:   EKG:  EKG is  ordered today.  The ekg ordered today demonstrates Sinus tachycardia  Recent Labs: 01/25/2021: ALT 12; BUN 9; Creat 0.61; Potassium 4.0; Sodium 137 03/18/2021: Hemoglobin 12.4; Platelets 490 05/27/2021: TSH 3.340   Lipid Panel    Component Value Date/Time   CHOL 179 01/25/2021 1140   TRIG 128 01/25/2021  1140   HDL 52 01/25/2021 1140   CHOLHDL 3.4 01/25/2021 1140   LDLCALC 105 (H) 01/25/2021 1140    Additional studies/ records that were reviewed today include:  NST 2019 There was no ST segment deviation noted during stress. The study is normal. There are no perfusion defects This is a low risk study. The left ventricular ejection fraction is normal (55-65%).   Echo 2019 Study Conclusions   - Left ventricle: The cavity size was normal. Wall thickness was    normal. Systolic function was normal. The estimated ejection    fraction was in the range of 55% to 60%. Diastolic dysfunction,    grade indeterminate. Normal filling pressures.  - Regional wall motion abnormality: Mild hypokinesis of the apical    anterior, apical inferior, apical lateral, and apical myocardium.  - Mitral valve: No significant mitral valve prolapse.   Risk Assessment/Calculations:    CHA2DS2-VASc Score = 2   This indicates a 2.2% annual risk of stroke. The patient's score is based upon: CHF History: 0 HTN History: 0 Diabetes History: 0 Stroke History: 0 Vascular Disease History: 1 Age Score: 0 Gender Score: 1        ASSESSMENT:    1. NICM (nonischemic cardiomyopathy) ([place])   2. History of chest pain   3. Hyperlipidemia, unspecified hyperlipidemia type   4. Rheumatoid arthritis involving multiple sites with positive rheumatoid factor ([place])   5. Coronary artery calcification seen on CT scan      PLAN:  In order of problems listed above:  NICM LVEF 55-60% echo 2019-will update echo  Sinus tachycardia-121 at  rest today. Has been on chronic steroids and ?Afib-patient says she had it years ago. Will check holter. BP up today but usually low.  History of atypical chest pain low risk NST 11/2017-no recent chest pain  HLD on statin  Coronary calcification on CT 12/2020-asymptomatic and on a statin  RA-severe  Shared Decision Making/Informed Consent        Medication Adjustments/Labs and Tests Ordered: Current medicines are reviewed at length with the patient today.  Concerns regarding medicines are outlined above.  Medication changes, Labs and Tests ordered today are listed in the Patient Instructions below. Patient Instructions  Medication Instructions:  Your physician recommends that you continue on your current medications as directed. Please refer to the Current Medication list given to you today.  *If you need a refill on your cardiac medications before your next appointment, please call your pharmacy*   Lab Work: None If you have labs (blood work) drawn today and your tests are completely normal, you will receive your results only by: Agency (if you have MyChart) OR A paper copy in the mail If you have any lab test that is abnormal or we need to change your treatment, we will call you to review the results.   Testing/Procedures: Your physician has requested that you have an echocardiogram. Echocardiography is a painless test that uses sound waves to create images of your heart. It provides your doctor with information about the size and shape of your heart and how well your heart's chambers and valves are working. This procedure takes approximately one hour. There are no restrictions for this procedure.    Follow-Up: At [facility], you and your health needs are our priority.  As part of our continuing mission to provide you with exceptional heart care, we have created designated Provider Care Teams.  These Care Teams include your primary Cardiologist (physician) and  Advanced Practice Providers (APPs -  Physician Assistants and Nurse Practitioners) who all work together to provide you with the care you need, when you need it.  We recommend signing up for the patient portal called "MyChart".  Sign up information is provided on this After Visit Summary.  MyChart is used to connect with patients for Virtual Visits (Telemedicine).  Patients are able to view lab/test results, encounter notes, upcoming appointments, etc.  Non-urgent messages can be sent to your provider as well.   To learn more about what you can do with MyChart, go to [email or web address].    Your next appointment:   1 year(s)  The format for your next appointment:   In Person  Provider:   Establish with MD   Other Instructions ZIO XT- Long Term Monitor Instructions   Your physician has requested you wear your ZIO patch monitor___14____days.   This is a single patch monitor.  [employer] supplies one patch monitor per enrollment.  Additional stickers are not available.   Please do not apply patch if you will be having a Nuclear Stress Test, Echocardiogram, Cardiac CT, MRI, or Chest Xray during the time frame you would be wearing the monitor. The patch cannot be worn during these tests.  You cannot remove and re-apply the ZIO XT patch monitor.   Your ZIO patch monitor will be sent USPS Priority mail from [facility] directly to your home address. The monitor may also be mailed to a PO BOX if home delivery is not available.   It may take 3-5 days to receive your monitor after you have been enrolled.   Once you have received you monitor, please review enclosed instructions.  Your monitor has already been registered assigning a specific monitor serial # to you.   Applying the monitor   Shave hair from upper left chest.   Hold abrader disc by orange tab.  Rub abrader in 40 strokes over left upper chest as indicated in your monitor instructions.   Clean area with 4 enclosed alcohol  pads .  Use all pads to assure are is cleaned thoroughly.  Let dry.   Apply patch as indicated in monitor instructions.  Patch will be place under collarbone on left side of chest with arrow pointing upward.   Rub patch adhesive wings for 2 minutes.Remove white label marked "1".  Remove white label marked "2".  Rub patch adhesive wings for 2 additional minutes.   While looking in a mirror, press and release button in center of patch.  A small green light will flash 3-4 times .  This will be your only indicator the monitor has been turned on.     Do not shower for the first 24 hours.  You may shower after the first 24 hours.   Press button if you feel a symptom. You will hear a small click.  Record Date, Time and Symptom in the Patient Log Book.   When you are ready to remove patch, follow instructions on last 2 pages of Patient Log Book.  Stick patch monitor onto last page of Patient Log Book.   Place Patient Log Book in [place] box.  Use locking tab on box and tape box closed securely.  The [employer] and [employer] has [employer] on it.  Please place in mailbox as soon as possible.  Your physician should have your test results approximately 7 days after the monitor has been mailed back to [facility].   Call [place] at  [phone] if you have questions regarding your ZIO XT patch monitor.  Call them immediately if you see an orange light blinking on your monitor.   If your monitor falls off in less than 4 days contact our Monitor department at [phone].  If your monitor becomes loose or falls off after 4 days call [employer] at [phone] for suggestions on securing your monitor.     Signed, Ermalinda Barrios, PA-C  07/18/2021 2:34 PM    [facility] [address], [place], [place]  [address] Phone: [phone]; Fax: [phone]

## 2021-07-04 NOTE — Telephone Encounter (Signed)
Patient called advised the Rx for [facility]) need prior approval before the pharmacy will fill the Rx. Patient said the Rx will need to go to the [facility] speciality pharmacy. The number to contact patient is [phone],  The number for pharmacy [phone]

## 2021-07-05 NOTE — Telephone Encounter (Signed)
See other note. This is being addressed.

## 2021-07-05 NOTE — Telephone Encounter (Signed)
I have been working on this and have filled out a form that will be faxed to insurance today. I have made patient aware.

## 2021-07-06 ENCOUNTER — Telehealth: Payer: Self-pay | Admitting: Surgical

## 2021-07-06 DIAGNOSIS — M546 Pain in thoracic spine: Secondary | ICD-10-CM | POA: Diagnosis not present

## 2021-07-06 DIAGNOSIS — M9901 Segmental and somatic dysfunction of cervical region: Secondary | ICD-10-CM | POA: Diagnosis not present

## 2021-07-06 DIAGNOSIS — M9902 Segmental and somatic dysfunction of thoracic region: Secondary | ICD-10-CM | POA: Diagnosis not present

## 2021-07-06 DIAGNOSIS — M542 Cervicalgia: Secondary | ICD-10-CM | POA: Diagnosis not present

## 2021-07-06 NOTE — Telephone Encounter (Signed)
I submitted paperwork yesterday afternoon for this. See other message for additional documentation

## 2021-07-06 NOTE — Telephone Encounter (Signed)
Received call from [facility] with [facility]  needing prior auth for Rx Forteo.  The cover my meds  reference number for  prior auth  is [id number]. The number to contact Yanessa is [phone]

## 2021-07-07 ENCOUNTER — Telehealth: Payer: Self-pay | Admitting: Surgical

## 2021-07-07 ENCOUNTER — Other Ambulatory Visit: Payer: Self-pay | Admitting: Surgical

## 2021-07-07 NOTE — Telephone Encounter (Signed)
Pt called stating that the pharmacy sent a request for pt to get a script of baclofen. Please send back to pharmacy. Pt phone number is [phone].

## 2021-07-07 NOTE — Telephone Encounter (Signed)
Another form received today requesting additional information--I have left this for [facility] to review and complete ASAP so we can submit back to insurance.

## 2021-07-08 ENCOUNTER — Encounter: Payer: Self-pay | Admitting: Surgical

## 2021-07-08 ENCOUNTER — Other Ambulatory Visit: Payer: Self-pay

## 2021-07-08 ENCOUNTER — Telehealth: Payer: Self-pay

## 2021-07-08 ENCOUNTER — Ambulatory Visit (INDEPENDENT_AMBULATORY_CARE_PROVIDER_SITE_OTHER): Payer: BC Managed Care – PPO | Admitting: Surgical

## 2021-07-08 DIAGNOSIS — M1712 Unilateral primary osteoarthritis, left knee: Secondary | ICD-10-CM | POA: Diagnosis not present

## 2021-07-08 DIAGNOSIS — M1711 Unilateral primary osteoarthritis, right knee: Secondary | ICD-10-CM | POA: Diagnosis not present

## 2021-07-08 DIAGNOSIS — M170 Bilateral primary osteoarthritis of knee: Secondary | ICD-10-CM | POA: Diagnosis not present

## 2021-07-08 NOTE — Telephone Encounter (Signed)
Vanessa Lara completed form and faxed back today.

## 2021-07-08 NOTE — Progress Notes (Signed)
Office Visit Note   Patient: Vanessa Lara           Date of Birth: 28-Feb-1956           MRN: [id number] Visit Date: 07/08/2021 Requested by: Eunice Blase, MD [address] [place],  [place] [address] PCP: Eunice Blase, MD  Subjective: Chief Complaint  Patient presents with   Right Knee - Follow-up   Left Knee - Follow-up    HPI: Vanessa Lara is a 65 y.o. female who presents to the office complaining of bilateral knee pain.  Complains of continued bilateral knee pain.  Using Ace bandages and Flexeril for pain control.  Cortisone injections helped briefly but have now worn off.  Cortisone injections for the shoulders are doing well and she has no recurrent symptoms in her shoulders.  She is doing home exercise program as designed by physical therapist.  She has continued difficulty gaining weight.  History of osteoporosis and Forteo is pending approval.  She is ambulating with a cane.  She has had prior gel injections that lasted for about 2 months.  She also continues to endorse right upper quadrant abdominal pain with intolerance to fatty foods and worsening symptoms after ingesting meals..                ROS: All systems reviewed are negative as they relate to the chief complaint within the history of present illness.  Patient denies fevers or chills.  Assessment & Plan: Visit Diagnoses:  1. Unilateral primary osteoarthritis, right knee   2. Unilateral primary osteoarthritis, left knee     Plan: Patient is a 65 year old female who presents for evaluation of bilateral knee pain.  Cortisone injections did not provide significant relief.  She has had prior gel injections that have helped.  Plan to preapproved her for these.  Additionally, she is struggling with weight loss and fatigue with quad weakness on exam today bilaterally which is likely secondary to her fatigue.  Plan to refer her to dietitian to focus on weight gain.  She is currently doing a ketogenic diet  which, with her intolerance to fatty foods and need to gain weight, is not optimal for her.  Forteo is pending approval for her osteoporosis.  Right upper quadrant ultrasound also pending with suspicion of cholelithiasis.  Plan for her to follow-up with her PCP at her next scheduled appointment in end of October for review of scan.  She is also having multiple referrals set up by her PCP given her weight loss of about 30 pounds this year.  Follow-Up Instructions: No follow-ups on file.   Orders:  No orders of the defined types were placed in this encounter.  No orders of the defined types were placed in this encounter.     Procedures: No procedures performed   Clinical Data: No additional findings.  Objective: Vital Signs: There were no vitals taken for this visit.  Physical Exam:  Constitutional: Patient appears well-developed HEENT:  Head: Normocephalic Eyes:EOM are normal Neck: Normal range of motion Cardiovascular: Normal rate Pulmonary/chest: Effort normal Neurologic: Patient is alert Skin: Skin is warm Psychiatric: Patient has normal mood and affect  Ortho Exam: Ortho exam demonstrates bilateral knees without effusion.  Tenderness over the medial lateral joint lines bilaterally.  She is able to perform straight leg raise with both legs but she does have quad weakness bilaterally rated 5 -/5.  No calf tenderness.  Negative Homans' sign.  No pain with hip range of  motion.  Specialty Comments:  No specialty comments available.  Imaging: No results found.   PMFS History: Patient Active Problem List   Diagnosis Date Noted   Loss of weight 05/18/2021   Protein-calorie malnutrition, severe ([place]) 05/18/2021   Osteoporosis 06/15/2020   Iron deficiency anemia 04/09/2020   Primary osteoarthritis involving multiple joints 12/31/2018   Rheumatoid arthritis involving multiple sites with positive rheumatoid factor ([place]) 12/31/2018   Irritable bowel syndrome with both  constipation and diarrhea 10/28/2018   Constipation 03/14/2016   Mitral valve prolapse 11/23/2015   High cholesterol 11/23/2015   Past Medical History:  Diagnosis Date   Anemia    Anxiety    Arthritis    RA   Cancer (HCC)    Skin   CHF (congestive heart failure) (HCC)    Collagen vascular disease (HCC)    Depression    Endometriosis    High cholesterol    IBS (irritable bowel syndrome)    Lymphadenopathy    Mitral valve prolapse    RA (rheumatoid arthritis) (HCC)    Rotator cuff tear    bilateral   Uterus, adenomyosis     Family History  Problem Relation Age of Onset   Colon cancer Other    Bone cancer Mother    Brain cancer Father    Brain cancer Sister    Breast cancer Sister    Renal Disease Sister    Pancreatic cancer Neg Hx    Esophageal cancer Neg Hx    Stomach cancer Neg Hx    Rectal cancer Neg Hx     Past Surgical History:  Procedure Laterality Date   AXILLARY LYMPH NODE BIOPSY Right 10/12/2020   Procedure: RIGHT AXILLARY LYMPH NODE BIOPSY EXCISION;  Surgeon: Stark Klein, MD;  Location: [facility];  Service: General;  Laterality: Right;   BUNIONECTOMY Right    CARPAL TUNNEL RELEASE Right    COLONOSCOPY     COLONOSCOPY N/A 12/13/2015   Procedure: COLONOSCOPY;  Surgeon: Rogene Houston, MD;  [facility];  Service: Endoscopy;  Laterality: N/A;  9:55   Fatty tumor     2011 (left arm)   FOOT SURGERY     hammertoe   TONSILLECTOMY     TOTAL ABDOMINAL HYSTERECTOMY     2003   Social History   Occupational History   Occupation: retired  Tobacco Use   Smoking status: Some Days    Packs/day: 0.50    Years: 33.00    Pack years: 16.50    Types: Cigarettes   Smokeless tobacco: Never   Tobacco comments:    quit 9 yrs ago  Vaping Use   Vaping Use: Never used  Substance and Sexual Activity   Alcohol use: No    Alcohol/week: 0.0 standard drinks   Drug use: No   Sexual activity: Not Currently

## 2021-07-08 NOTE — Telephone Encounter (Signed)
Vanessa Lara would like to get this patent authorized for bilateral knee gel injections.

## 2021-07-08 NOTE — Telephone Encounter (Signed)
Noted  

## 2021-07-10 NOTE — Telephone Encounter (Signed)
This was done on friday

## 2021-07-12 ENCOUNTER — Ambulatory Visit (HOSPITAL_COMMUNITY): Admission: RE | Admit: 2021-07-12 | Payer: BC Managed Care – PPO | Source: Ambulatory Visit

## 2021-07-13 ENCOUNTER — Other Ambulatory Visit: Payer: Self-pay

## 2021-07-13 ENCOUNTER — Ambulatory Visit (HOSPITAL_COMMUNITY)
Admission: RE | Admit: 2021-07-13 | Discharge: 2021-07-13 | Disposition: A | Discharge status: Home / Self Care | Payer: BC Managed Care – PPO | Source: Ambulatory Visit | Attending: Surgical | Admitting: Surgical

## 2021-07-13 DIAGNOSIS — R1011 Right upper quadrant pain: Secondary | ICD-10-CM | POA: Diagnosis not present

## 2021-07-14 ENCOUNTER — Encounter: Payer: Self-pay | Admitting: Podiatry

## 2021-07-14 ENCOUNTER — Ambulatory Visit (INDEPENDENT_AMBULATORY_CARE_PROVIDER_SITE_OTHER): Payer: BC Managed Care – PPO | Admitting: Podiatry

## 2021-07-14 ENCOUNTER — Ambulatory Visit (INDEPENDENT_AMBULATORY_CARE_PROVIDER_SITE_OTHER): Payer: BC Managed Care – PPO

## 2021-07-14 DIAGNOSIS — M2042 Other hammer toe(s) (acquired), left foot: Secondary | ICD-10-CM

## 2021-07-14 DIAGNOSIS — M7752 Other enthesopathy of left foot: Secondary | ICD-10-CM

## 2021-07-14 DIAGNOSIS — M2041 Other hammer toe(s) (acquired), right foot: Secondary | ICD-10-CM | POA: Diagnosis not present

## 2021-07-15 ENCOUNTER — Telehealth: Payer: Self-pay

## 2021-07-15 NOTE — Telephone Encounter (Signed)
Talked with patient and advised her that she is not able to receive gel injections until after 6 months.  Patient voiced that she understands.

## 2021-07-15 NOTE — Progress Notes (Signed)
Subjective:   Patient ID: Vanessa Lara, female   DOB: 65 y.o.   MRN: [id number]   HPI Patient presents stating her feet are very sore specially her left and she is here to consider surgery but is not in good health and is continue to lose weight and also has advanced systemic arthritis   ROS      Objective:  Physical Exam  Neurovascular status was found to be intact with patient having significant foot structural issues with digital deformities and a inflammatory condition of the inner phalangeal joint left big toe that sore with probability that there is bony pathology that systemic related to her arthritis      Assessment:  Inflammatory condition with inflammation of the inner phalangeal joint of the left big toe with also digital deformities of digit 2 left digit 4 right elevated toes with moderate pain     Plan:  H&P all conditions reviewed and I went ahead today and for the left I did sterile prep and injected the inner phalangeal joint 3 mg dexamethasone 5 mg Xylocaine plantarly to try to reduce the inflammation applied padding to the digits discussed the possibility for surgical intervention but I think would be very difficult due to osteoporosis and her overall systemic condition and would rather try to treat her conservatively to the best of our abilities  X-ray indicates significant arthritis bilateral with what appears to be a displacement of the inner phalangeal joint left big toe with bone destruction

## 2021-07-15 NOTE — Telephone Encounter (Signed)
Patient will not be able to have gel injection for Left Knee until after 09/17/2021 and for the Right Knee until after 10/02/2021 due to having these done on 03/18/2021 and 04/01/2021.

## 2021-07-18 ENCOUNTER — Encounter: Payer: Self-pay | Admitting: Physician Assistant

## 2021-07-18 ENCOUNTER — Other Ambulatory Visit: Payer: Self-pay

## 2021-07-18 ENCOUNTER — Ambulatory Visit (INDEPENDENT_AMBULATORY_CARE_PROVIDER_SITE_OTHER): Payer: BC Managed Care – PPO | Admitting: Physician Assistant

## 2021-07-18 ENCOUNTER — Other Ambulatory Visit: Payer: Self-pay | Admitting: Physician Assistant

## 2021-07-18 ENCOUNTER — Ambulatory Visit: Payer: BC Managed Care – PPO

## 2021-07-18 ENCOUNTER — Ambulatory Visit: Payer: BC Managed Care – PPO | Admitting: Podiatry

## 2021-07-18 VITALS — BP 140/90 | HR 106 | Ht 60.0 in | Wt 74.6 lb

## 2021-07-18 DIAGNOSIS — M0579 Rheumatoid arthritis with rheumatoid factor of multiple sites without organ or systems involvement: Secondary | ICD-10-CM | POA: Diagnosis not present

## 2021-07-18 DIAGNOSIS — R002 Palpitations: Secondary | ICD-10-CM

## 2021-07-18 DIAGNOSIS — I428 Other cardiomyopathies: Secondary | ICD-10-CM | POA: Diagnosis not present

## 2021-07-18 DIAGNOSIS — E785 Hyperlipidemia, unspecified: Secondary | ICD-10-CM

## 2021-07-18 DIAGNOSIS — Z87898 Personal history of other specified conditions: Secondary | ICD-10-CM | POA: Diagnosis not present

## 2021-07-18 DIAGNOSIS — I2510 Atherosclerotic heart disease of native coronary artery without angina pectoris: Secondary | ICD-10-CM

## 2021-07-18 NOTE — Patient Instructions (Signed)
Medication Instructions:  Your physician recommends that you continue on your current medications as directed. Please refer to the Current Medication list given to you today.  *If you need a refill on your cardiac medications before your next appointment, please call your pharmacy*   Lab Work: None If you have labs (blood work) drawn today and your tests are completely normal, you will receive your results only by: [facility] (if you have MyChart) OR A paper copy in the mail If you have any lab test that is abnormal or we need to change your treatment, we will call you to review the results.   Testing/Procedures: Your physician has requested that you have an echocardiogram. Echocardiography is a painless test that uses sound waves to create images of your heart. It provides your doctor with information about the size and shape of your heart and how well your heart's chambers and valves are working. This procedure takes approximately one hour. There are no restrictions for this procedure.    Follow-Up: At [facility], you and your health needs are our priority.  As part of our continuing mission to provide you with exceptional heart care, we have created designated Provider Care Teams.  These Care Teams include your primary Cardiologist (physician) and Advanced Practice Providers (APPs -  Physician Assistants and Nurse Practitioners) who all work together to provide you with the care you need, when you need it.  We recommend signing up for the patient portal called "MyChart".  Sign up information is provided on this After Visit Summary.  MyChart is used to connect with patients for Virtual Visits (Telemedicine).  Patients are able to view lab/test results, encounter notes, upcoming appointments, etc.  Non-urgent messages can be sent to your provider as well.   To learn more about what you can do with MyChart, go to [email or web address].    Your next appointment:   1 year(s)  The  format for your next appointment:   In Person  Provider:   Establish with MD   Other Instructions ZIO XT- Long Term Monitor Instructions   Your physician has requested you wear your ZIO patch monitor___14____days.   This is a single patch monitor.  [employer] supplies one patch monitor per enrollment.  Additional stickers are not available.   Please do not apply patch if you will be having a Nuclear Stress Test, Echocardiogram, Cardiac CT, MRI, or Chest Xray during the time frame you would be wearing the monitor. The patch cannot be worn during these tests.  You cannot remove and re-apply the ZIO XT patch monitor.   Your ZIO patch monitor will be sent USPS Priority mail from [facility] directly to your home address. The monitor may also be mailed to a PO BOX if home delivery is not available.   It may take 3-5 days to receive your monitor after you have been enrolled.   Once you have received you monitor, please review enclosed instructions.  Your monitor has already been registered assigning a specific monitor serial # to you.   Applying the monitor   Shave hair from upper left chest.   Hold abrader disc by orange tab.  Rub abrader in 40 strokes over left upper chest as indicated in your monitor instructions.   Clean area with 4 enclosed alcohol pads .  Use all pads to assure are is cleaned thoroughly.  Let dry.   Apply patch as indicated in monitor instructions.  Patch will be place under collarbone on left side  of chest with arrow pointing upward.   Rub patch adhesive wings for 2 minutes.Remove white label marked "1".  Remove white label marked "2".  Rub patch adhesive wings for 2 additional minutes.   While looking in a mirror, press and release button in center of patch.  A small green light will flash 3-4 times .  This will be your only indicator the monitor has been turned on.     Do not shower for the first 24 hours.  You may shower after the first 24 hours.   Press  button if you feel a symptom. You will hear a small click.  Record Date, Time and Symptom in the Patient Log Book.   When you are ready to remove patch, follow instructions on last 2 pages of Patient Log Book.  Stick patch monitor onto last page of Patient Log Book.   Place Patient Log Book in [place] box.  Use locking tab on box and tape box closed securely.  The [employer] and [employer] has [employer] on it.  Please place in mailbox as soon as possible.  Your physician should have your test results approximately 7 days after the monitor has been mailed back to [facility].   Call [place] at [phone] if you have questions regarding your ZIO XT patch monitor.  Call them immediately if you see an orange light blinking on your monitor.   If your monitor falls off in less than 4 days contact our Monitor department at [phone].  If your monitor becomes loose or falls off after 4 days call [employer] at [phone] for suggestions on securing your monitor.

## 2021-07-19 DIAGNOSIS — R932 Abnormal findings on diagnostic imaging of liver and biliary tract: Secondary | ICD-10-CM | POA: Diagnosis not present

## 2021-07-19 DIAGNOSIS — M0579 Rheumatoid arthritis with rheumatoid factor of multiple sites without organ or systems involvement: Secondary | ICD-10-CM | POA: Diagnosis not present

## 2021-07-19 DIAGNOSIS — E538 Deficiency of other specified B group vitamins: Secondary | ICD-10-CM | POA: Diagnosis not present

## 2021-07-19 DIAGNOSIS — R634 Abnormal weight loss: Secondary | ICD-10-CM | POA: Diagnosis not present

## 2021-07-19 DIAGNOSIS — D508 Other iron deficiency anemias: Secondary | ICD-10-CM | POA: Diagnosis not present

## 2021-07-19 DIAGNOSIS — Z87891 Personal history of nicotine dependence: Secondary | ICD-10-CM | POA: Diagnosis not present

## 2021-07-19 DIAGNOSIS — J432 Centrilobular emphysema: Secondary | ICD-10-CM | POA: Diagnosis not present

## 2021-07-21 ENCOUNTER — Telehealth: Payer: Self-pay

## 2021-07-21 DIAGNOSIS — R636 Underweight: Secondary | ICD-10-CM

## 2021-07-21 NOTE — Telephone Encounter (Signed)
I have tried calling patient several times and get busy signal every time.  I have also put in a referral for patient to see dietician.

## 2021-07-21 NOTE — Telephone Encounter (Signed)
Awesome, thank you 

## 2021-07-22 ENCOUNTER — Encounter: Payer: Self-pay | Admitting: Surgical

## 2021-07-22 ENCOUNTER — Other Ambulatory Visit: Payer: Self-pay

## 2021-07-22 ENCOUNTER — Ambulatory Visit: Payer: BC Managed Care – PPO | Admitting: Surgical

## 2021-07-25 DIAGNOSIS — R911 Solitary pulmonary nodule: Secondary | ICD-10-CM | POA: Diagnosis not present

## 2021-07-25 DIAGNOSIS — Z87891 Personal history of nicotine dependence: Secondary | ICD-10-CM | POA: Diagnosis not present

## 2021-07-25 DIAGNOSIS — M0579 Rheumatoid arthritis with rheumatoid factor of multiple sites without organ or systems involvement: Secondary | ICD-10-CM | POA: Diagnosis not present

## 2021-07-25 DIAGNOSIS — R5382 Chronic fatigue, unspecified: Secondary | ICD-10-CM | POA: Insufficient documentation

## 2021-07-25 DIAGNOSIS — R000 Tachycardia, unspecified: Secondary | ICD-10-CM | POA: Diagnosis not present

## 2021-07-28 ENCOUNTER — Encounter: Payer: BC Managed Care – PPO | Admitting: Physical Medicine & Rehabilitation

## 2021-07-29 ENCOUNTER — Encounter: Payer: Self-pay | Admitting: Podiatry

## 2021-07-29 ENCOUNTER — Ambulatory Visit (INDEPENDENT_AMBULATORY_CARE_PROVIDER_SITE_OTHER): Payer: BC Managed Care – PPO | Admitting: Podiatry

## 2021-07-29 ENCOUNTER — Other Ambulatory Visit: Payer: Self-pay

## 2021-07-29 DIAGNOSIS — M7752 Other enthesopathy of left foot: Secondary | ICD-10-CM

## 2021-07-29 DIAGNOSIS — M2042 Other hammer toe(s) (acquired), left foot: Secondary | ICD-10-CM

## 2021-07-29 DIAGNOSIS — M2041 Other hammer toe(s) (acquired), right foot: Secondary | ICD-10-CM

## 2021-08-01 ENCOUNTER — Other Ambulatory Visit: Payer: Self-pay | Admitting: Surgical

## 2021-08-01 NOTE — Progress Notes (Signed)
Subjective:   Patient ID: Vanessa Lara, female   DOB: 65 y.o.   MRN: [id number]   HPI Patient states the spot on the bottom really bothers me and I am wondering about surgery for this and I am hoping to see a cardiologist soon   ROS      Objective:  Physical Exam  Neurovascular status unchanged with patient found to have a prominence with a dislocation of the inner phalangeal joint left big toe that is painful on the bottom secondary to structural position with patient is relatively malnourished and has trouble eating who also needs to be evaluated from a cardiac perspective     Assessment:  Poor health individual with osteoporosis is developed some dislocation of the interphalangeal joint left big toe     Plan:  8 NP reviewed condition and I do not want to rush to surgery will continue to use cushioning and padding and I went ahead today and I did advise her to see her cardiologist and whether or not she could be approved to have a short outpatient procedure done we would consider either fusion of the big toe or possibly just removing some of the plantar condyle of the proximal phalanx hallux as I be concerned about healing effusion

## 2021-08-07 ENCOUNTER — Other Ambulatory Visit: Payer: Self-pay | Admitting: Specialist

## 2021-08-07 MED ORDER — BACLOFEN 10 MG PO TABS: 20.0000 mg | ORAL_TABLET | Freq: Three times a day (TID) | ORAL | 0 refills | Status: DC

## 2021-08-08 ENCOUNTER — Telehealth: Payer: Self-pay | Admitting: Surgical

## 2021-08-08 ENCOUNTER — Ambulatory Visit: Payer: BC Managed Care – PPO | Admitting: Podiatry

## 2021-08-08 NOTE — Telephone Encounter (Signed)
Pt called asking for a CB in regards to picking up a special guided needle that goes along with one of her rxs.   [phone]

## 2021-08-08 NOTE — Telephone Encounter (Signed)
Pt called and states that she thought she was suppose to been seen this month, but her appt isn't until January of 2023. Also her pain medication isn't working. Needs something stronger.   CB [phone]

## 2021-08-08 NOTE — Telephone Encounter (Signed)
I wpoke with Vanessa Lara. He will call her and discuss

## 2021-08-09 ENCOUNTER — Telehealth: Payer: Self-pay | Admitting: Orthopedic Surgery

## 2021-08-09 ENCOUNTER — Telehealth: Payer: Self-pay | Admitting: Physician Assistant

## 2021-08-09 NOTE — Telephone Encounter (Signed)
Pt called and states her right ankle and knee cap has been really hurting. She states she can not put any weight on it. She wants to come in today if possible to be seen by Dr.Dean or Dr.Blackman.   CB [phone]

## 2021-08-09 NOTE — Telephone Encounter (Signed)
   Pt called because she is having leg pain.   She has arthritis in her R kneecap and has a long hx of pain from this. She saw the Dr and was told she cannot get another steroid shot till January.  Recently, the pain has gotten worse. She says today, the pain started radiating down her leg and her calf is very painful.  She is walking with a cane and cannot put weight on her R leg. This is new today.   She thinks she has a clot in her leg. Wants to know what to do. She has MD appointment tomorrow morning.  Discussed options with her. Advised that if she has a clot, she needs blood thinners, but those cannot be prescribed without testing to confirm the clot.  Cannot get the testing anywhere but at the ER.  Advised that if she does not want to wait till tomorrow to see the MD, will have to go to the ER. She says she will do so.   Rosaria Ferries, PA-C 08/09/2021 5:47 PM

## 2021-08-10 ENCOUNTER — Encounter: Payer: Self-pay | Admitting: Orthopaedic Surgery

## 2021-08-10 ENCOUNTER — Ambulatory Visit (INDEPENDENT_AMBULATORY_CARE_PROVIDER_SITE_OTHER): Payer: BC Managed Care – PPO | Admitting: Orthopaedic Surgery

## 2021-08-10 VITALS — Wt 74.0 lb

## 2021-08-10 DIAGNOSIS — M25561 Pain in right knee: Secondary | ICD-10-CM | POA: Diagnosis not present

## 2021-08-10 DIAGNOSIS — M1711 Unilateral primary osteoarthritis, right knee: Secondary | ICD-10-CM | POA: Diagnosis not present

## 2021-08-10 MED ORDER — TIZANIDINE HCL 2 MG PO TABS: 2.0000 mg | ORAL_TABLET | Freq: Three times a day (TID) | ORAL | 0 refills | Status: DC | PRN

## 2021-08-10 NOTE — Progress Notes (Signed)
Office Visit Note   Patient: Vanessa Lara           Date of Birth: September 25, 1956           MRN: [id number] Visit Date: 08/10/2021              Requested by: Pablo Lawrence, NP [place],  [place] [address] PCP: Pablo Lawrence, NP   Assessment & Plan: Visit Diagnoses:  1. Primary osteoarthritis of right knee     Plan: She will follow up with our office sometime in January for supplemental injections.  Per her request we did discontinue her baclofen and change her to Zanaflex see if this works better for her.  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  Questions were encouraged and answered at length today.  Follow-Up Instructions: Return if symptoms worsen or fail to improve.   Orders:  Orders Placed This Encounter  Procedures   Large Joint Inj   No orders of the defined types were placed in this encounter.     Procedures: Large Joint Inj: R knee on 08/10/2021 10:58 AM Indications: pain Details: 22 G 1.5 in needle, anterolateral approach  Arthrogram: No  Medications: 3 mL lidocaine 1 %; 40 mg methylPREDNISolone acetate 40 MG/ML Outcome: tolerated well, no immediate complications Procedure, treatment alternatives, risks and benefits explained, specific risks discussed. Consent was given by the patient. Immediately prior to procedure a time out was called to verify the correct patient, procedure, equipment, support staff and site/side marked as required. Patient was prepped and draped in the usual sterile fashion.      Clinical Data: No additional findings.   Subjective: No chief complaint on file.   HPI Vanessa Lara comes in today complaining of right knee pain.  She has had MRI of her right knee just this past August which showed full-thickness cartilage loss involving the medial compartment.  Edema was noted at lateral femoral condyle but no significant degenerative changes.  She is mainly complaining of the lateral compartment pain  today.  Denies any injury.  She is also complaining of multiple body aches and states that "she hurts all over".  She still having a hard time gaining weight is unsure of why she is having weight loss.  She is due to see a nutritionist later on this month to hopefully help with the weight gain.  She also notes that her baclofen is no longer helping with her back pain that increasing it caused her some drowsiness and she is requesting changing to a different muscle relaxant today. Review of Systems See HPI  Objective: Vital Signs: Wt 74 lb (33.6 kg)   BMI 14.45 kg/m   Physical Exam General: Frail-appearing female in no acute distress.  She is brought in today in wheelchair. Psych: Alert and oriented x3 Ortho Exam Right knee good range of motion of the knee.  No effusion no abnormal warmth.  Tenderness along lateral joint line and over the medial joint line.  No gross instability valgus varus stressing. Specialty Comments:  No specialty comments available.  Imaging: No results found.   PMFS History: Patient Active Problem List   Diagnosis Date Noted   Loss of weight 05/18/2021   Protein-calorie malnutrition, severe ([place]) 05/18/2021   Osteoporosis 06/15/2020   Iron deficiency anemia 04/09/2020   Primary osteoarthritis involving multiple joints 12/31/2018   Rheumatoid arthritis involving multiple sites with positive rheumatoid factor ([place]) 12/31/2018   Irritable bowel syndrome with  both constipation and diarrhea 10/28/2018   Constipation 03/14/2016   Mitral valve prolapse 11/23/2015   High cholesterol 11/23/2015   Past Medical History:  Diagnosis Date   Anemia    Anxiety    Arthritis    RA   Cancer (HCC)    Skin   CHF (congestive heart failure) (HCC)    Collagen vascular disease (HCC)    Depression    Endometriosis    High cholesterol    IBS (irritable bowel syndrome)    Lymphadenopathy    Mitral valve prolapse    RA (rheumatoid arthritis) (HCC)    Rotator cuff tear     bilateral   Uterus, adenomyosis     Family History  Problem Relation Age of Onset   Colon cancer Other    Bone cancer Mother    Brain cancer Father    Brain cancer Sister    Breast cancer Sister    Renal Disease Sister    Pancreatic cancer Neg Hx    Esophageal cancer Neg Hx    Stomach cancer Neg Hx    Rectal cancer Neg Hx     Past Surgical History:  Procedure Laterality Date   AXILLARY LYMPH NODE BIOPSY Right 10/12/2020   Procedure: RIGHT AXILLARY LYMPH NODE BIOPSY EXCISION;  Surgeon: Stark Klein, MD;  Location: [facility];  Service: General;  Laterality: Right;   BUNIONECTOMY Right    CARPAL TUNNEL RELEASE Right    COLONOSCOPY     COLONOSCOPY N/A 12/13/2015   Procedure: COLONOSCOPY;  Surgeon: Rogene Houston, MD;  [facility];  Service: Endoscopy;  Laterality: N/A;  9:55   Fatty tumor     2011 (left arm)   FOOT SURGERY     hammertoe   TONSILLECTOMY     TOTAL ABDOMINAL HYSTERECTOMY     2003   Social History   Occupational History   Occupation: retired  Tobacco Use   Smoking status: Former    Packs/day: 0.50    Years: 33.00    Pack years: 16.50    Types: Cigarettes   Smokeless tobacco: Never   Tobacco comments:    quit 9 yrs ago  Vaping Use   Vaping Use: Never used  Substance and Sexual Activity   Alcohol use: No    Alcohol/week: 0.0 standard drinks   Drug use: No   Sexual activity: Not Currently

## 2021-08-11 ENCOUNTER — Telehealth: Payer: Self-pay | Admitting: Physician Assistant

## 2021-08-11 MED ORDER — METHYLPREDNISOLONE ACETATE 40 MG/ML IJ SUSP
40.0000 mg | INTRAMUSCULAR | Status: AC | PRN
  Administered 2021-08-10: 40 mg via INTRA_ARTICULAR

## 2021-08-11 MED ORDER — LIDOCAINE HCL 1 % IJ SOLN
3.0000 mL | INTRAMUSCULAR | Status: AC | PRN
  Administered 2021-08-10: 3 mL

## 2021-08-11 NOTE — Telephone Encounter (Signed)
Pt called stating she was given a rx at her appt on 08/10/21 and she states she only took half and not even 30 mins later she had absolutely no pain. Pt also wanted to make Artis Delay aware that about 5 days ago she weighed 68 lbs; and at her appt yesterday she weighed 74.4. Pt states the extra weight is all fluid and its gathered around her ankle and the top of her foot. Pt states if anything needs to be called in she would like it sent to the [facility] on [address]   [phone]

## 2021-08-11 NOTE — Telephone Encounter (Signed)
Please advise 

## 2021-08-12 NOTE — Telephone Encounter (Signed)
I called and talked to the pt. She wanted to see if you would call her in lasix 5mg  to help with the swelling since dr. Junius Roads is gone and she doesn't see her cardiologist until next Friday. Please advise

## 2021-08-12 NOTE — Telephone Encounter (Signed)
She doesn't have one right now

## 2021-08-16 ENCOUNTER — Ambulatory Visit (HOSPITAL_COMMUNITY)
Admission: RE | Admit: 2021-08-16 | Discharge: 2021-08-16 | Disposition: A | Discharge status: Home / Self Care | Payer: BC Managed Care – PPO | Source: Ambulatory Visit | Attending: Physician Assistant | Admitting: Physician Assistant

## 2021-08-16 ENCOUNTER — Other Ambulatory Visit: Payer: Self-pay

## 2021-08-16 DIAGNOSIS — I428 Other cardiomyopathies: Secondary | ICD-10-CM | POA: Diagnosis not present

## 2021-08-16 LAB — ECHOCARDIOGRAM COMPLETE
AR max vel: 2.85 cm2
AV Area VTI: 2.97 cm2
AV Area mean vel: 2.69 cm2
AV Mean grad: 1 mmHg
AV Peak grad: 2.7 mmHg
Ao pk vel: 0.82 m/s
Area-P 1/2: 5.88 cm2
Calc EF: 54.5 %
MV VTI: 2.18 cm2
S' Lateral: 2.35 cm
Single Plane A2C EF: 47.6 %
Single Plane A4C EF: 62 %

## 2021-08-16 NOTE — Telephone Encounter (Signed)
Tried calling pt to inform. No answer and mailbox was full

## 2021-08-16 NOTE — Progress Notes (Signed)
*  PRELIMINARY RESULTS* Echocardiogram 2D Echocardiogram has been performed.  Vanessa Lara 08/16/2021, 4:14 PM

## 2021-08-17 DIAGNOSIS — R7402 Elevation of levels of lactic acid dehydrogenase (LDH): Secondary | ICD-10-CM | POA: Insufficient documentation

## 2021-08-17 DIAGNOSIS — E559 Vitamin D deficiency, unspecified: Secondary | ICD-10-CM | POA: Insufficient documentation

## 2021-08-18 ENCOUNTER — Ambulatory Visit: Payer: BC Managed Care – PPO | Admitting: Podiatry

## 2021-08-23 ENCOUNTER — Ambulatory Visit: Payer: BC Managed Care – PPO | Admitting: Nutrition

## 2021-09-02 ENCOUNTER — Telehealth: Payer: Self-pay | Admitting: *Deleted

## 2021-09-02 NOTE — Telephone Encounter (Signed)
Received call from pt stating that her PCP told her she needs to get an iron infusion, so she called [place]. We have not heard from her PCP. She has not been to our office since 08/23/20   Please advise . Has Labs done @ [facility] on 08/30/21. Results are in [place]

## 2021-09-04 ENCOUNTER — Other Ambulatory Visit: Payer: Self-pay | Admitting: Specialist

## 2021-09-05 ENCOUNTER — Other Ambulatory Visit (HOSPITAL_COMMUNITY): Payer: Self-pay | Admitting: Physician Assistant

## 2021-09-05 ENCOUNTER — Encounter (HOSPITAL_COMMUNITY): Payer: Self-pay | Admitting: Hematology

## 2021-09-05 DIAGNOSIS — D509 Iron deficiency anemia, unspecified: Secondary | ICD-10-CM

## 2021-09-07 ENCOUNTER — Encounter: Payer: Self-pay | Admitting: *Deleted

## 2021-09-07 ENCOUNTER — Inpatient Hospital Stay (HOSPITAL_COMMUNITY): Payer: Medicare HMO | Attending: Hematology

## 2021-09-07 ENCOUNTER — Telehealth: Payer: Self-pay | Admitting: *Deleted

## 2021-09-07 ENCOUNTER — Other Ambulatory Visit: Payer: Self-pay

## 2021-09-07 DIAGNOSIS — M069 Rheumatoid arthritis, unspecified: Secondary | ICD-10-CM | POA: Insufficient documentation

## 2021-09-07 DIAGNOSIS — Z79899 Other long term (current) drug therapy: Secondary | ICD-10-CM | POA: Insufficient documentation

## 2021-09-07 DIAGNOSIS — M810 Age-related osteoporosis without current pathological fracture: Secondary | ICD-10-CM | POA: Diagnosis not present

## 2021-09-07 DIAGNOSIS — R634 Abnormal weight loss: Secondary | ICD-10-CM | POA: Diagnosis not present

## 2021-09-07 DIAGNOSIS — D509 Iron deficiency anemia, unspecified: Secondary | ICD-10-CM | POA: Insufficient documentation

## 2021-09-07 LAB — CBC WITH DIFFERENTIAL/PLATELET
Abs Immature Granulocytes: 0.03 10*3/uL (ref 0.00–0.07)
Basophils Absolute: 0.1 10*3/uL (ref 0.0–0.1)
Basophils Relative: 1 %
Eosinophils Absolute: 0.2 10*3/uL (ref 0.0–0.5)
Eosinophils Relative: 1 %
HCT: 35.3 % — ABNORMAL LOW (ref 36.0–46.0)
Hemoglobin: 11.8 g/dL — ABNORMAL LOW (ref 12.0–15.0)
Immature Granulocytes: 0 %
Lymphocytes Relative: 18 %
Lymphs Abs: 1.9 10*3/uL (ref 0.7–4.0)
MCH: 30 pg (ref 26.0–34.0)
MCHC: 33.4 g/dL (ref 30.0–36.0)
MCV: 89.8 fL (ref 80.0–100.0)
Monocytes Absolute: 0.7 10*3/uL (ref 0.1–1.0)
Monocytes Relative: 7 %
Neutro Abs: 7.5 10*3/uL (ref 1.7–7.7)
Neutrophils Relative %: 73 %
Platelets: 479 10*3/uL — ABNORMAL HIGH (ref 150–400)
RBC: 3.93 MIL/uL (ref 3.87–5.11)
RDW: 13.5 % (ref 11.5–15.5)
WBC: 10.4 10*3/uL (ref 4.0–10.5)
nRBC: 0 % (ref 0.0–0.2)

## 2021-09-07 LAB — IRON AND TIBC
Iron: 12 ug/dL — ABNORMAL LOW (ref 28–170)
Saturation Ratios: 5 % — ABNORMAL LOW (ref 10.4–31.8)
TIBC: 240 ug/dL — ABNORMAL LOW (ref 250–450)
UIBC: 228 ug/dL

## 2021-09-07 LAB — FERRITIN: Ferritin: 60 ng/mL (ref 11–307)

## 2021-09-07 NOTE — Telephone Encounter (Signed)
TCT patient regarding her call requesting an iron infusion.  She stated her PCP requesting it, though we had not gotten any notification from her PCP. Dr. Lorenso Courier reviewed her labs from 08/30/21 and he does not recommend any iron transfusions at this time.  He recommends she continue her oral iron with a source of Vitamin C. She is to continue her follow up visits with her PCP. She does not need to come to this clinic at this time. Pt advised of the above on identified phone. Advised to call back if she has any questions or concerns.

## 2021-09-08 NOTE — Progress Notes (Signed)
Vanessa Lara, [place] [address]   CLINIC:  Medical Oncology/Hematology  PCP:  Pablo [place], NP [place] [address] [phone]   REASON FOR VISIT:  Follow-up for iron deficiency anemia and lymphadenopathy  PRIOR THERAPY: None  CURRENT THERAPY: Intermittent Feraheme  INTERVAL HISTORY:  Vanessa Lara 65 y.o. female returns for routine follow-up of her iron deficiency anemia and lymphadenopathy.  She was last seen by Dr. Delton Coombes on 08/11/2020, and was lost to follow-up since that time.  She apparently went and saw Dr. Lorenso Courier at the [facility] for a second opinion, but also stopped following up with him.  She returns today at the recommendation of her primary care provider due to low serum iron.    At today's visit, she reports feeling poorly.  She continues to suffer from malnutrition, weight loss, severe fatigue, and general malaise.  She is seeing multiple specialists for workup and management of these chronic issues.  Regarding her anemia of chronic disease and iron deficiency, she has not noted any blood loss such as epistaxis, hematemesis, hematochezia, or melena.  She denies any new lumps or bumps.  She does continue to have unintentional weight loss per her report, although her weight appears to have been fairly stable over the last year (although still quite low).  She reports that she cannot gain any weight despite how much she eats.  She denies any other B symptoms such as fever, chills, or night sweats.  She remains severely fatigued.  She has little to no energy and 10% appetite. She endorses that she is maintaining a stable weight.    REVIEW OF SYSTEMS:  Review of Systems  Constitutional:  Positive for appetite change, fatigue and unexpected weight change. Negative for chills, diaphoresis and fever.  HENT:   Negative for lump/mass and nosebleeds.   Eyes:  Negative for eye problems.   Respiratory:  Negative for cough, hemoptysis and shortness of breath.   Cardiovascular:  Negative for chest pain, leg swelling and palpitations.  Gastrointestinal:  Positive for constipation, diarrhea and nausea. Negative for abdominal pain, blood in stool and vomiting.  Genitourinary:  Negative for hematuria.   Musculoskeletal:  Positive for arthralgias and back pain.  Skin: Negative.   Neurological:  Negative for dizziness, headaches and light-headedness.  Hematological:  Does not bruise/bleed easily.     PAST MEDICAL/SURGICAL HISTORY:  Past Medical History:  Diagnosis Date   Anemia    Anxiety    Arthritis    RA   Cancer (HCC)    Skin   CHF (congestive heart failure) (HCC)    Collagen vascular disease (HCC)    Depression    Endometriosis    High cholesterol    IBS (irritable bowel syndrome)    Lymphadenopathy    Mitral valve prolapse    RA (rheumatoid arthritis) (HCC)    Rotator cuff tear    bilateral   Uterus, adenomyosis    Past Surgical History:  Procedure Laterality Date   AXILLARY LYMPH NODE BIOPSY Right 10/12/2020   Procedure: RIGHT AXILLARY LYMPH NODE BIOPSY EXCISION;  Surgeon: Stark Klein, MD;  Location: [facility];  Service: General;  Laterality: Right;   BUNIONECTOMY Right    CARPAL TUNNEL RELEASE Right    COLONOSCOPY     COLONOSCOPY N/A 12/13/2015   Procedure: COLONOSCOPY;  Surgeon: Rogene Houston, MD;  [facility];  Service: Endoscopy;  Laterality:  N/A;  9:55   Fatty tumor     2011 (left arm)   FOOT SURGERY     hammertoe   TONSILLECTOMY     TOTAL ABDOMINAL HYSTERECTOMY     2003     SOCIAL HISTORY:  Social History   Socioeconomic History   Marital status: Single    Spouse name: Not on file   Number of children: Not on file   Years of education: Not on file   Highest education level: Not on file  Occupational History   Occupation: retired  Tobacco Use   Smoking status: Former    Packs/day: 0.50    Years: 33.00     Pack years: 16.50    Types: Cigarettes   Smokeless tobacco: Never   Tobacco comments:    quit 9 yrs ago  Vaping Use   Vaping Use: Never used  Substance and Sexual Activity   Alcohol use: No    Alcohol/week: 0.0 standard drinks   Drug use: No   Sexual activity: Not Currently  Other Topics Concern   Not on file  Social History Narrative   Not on file   Social Determinants of Health   Financial Resource Strain: Not on file  Food Insecurity: Not on file  Transportation Needs: Not on file  Physical Activity: Not on file  Stress: Not on file  Social Connections: Not on file  Intimate Partner Violence: Not on file    FAMILY HISTORY:  Family History  Problem Relation Age of Onset   Colon cancer Other    Bone cancer Mother    Brain cancer Father    Brain cancer Sister    Breast cancer Sister    Renal Disease Sister    Pancreatic cancer Neg Hx    Esophageal cancer Neg Hx    Stomach cancer Neg Hx    Rectal cancer Neg Hx     CURRENT MEDICATIONS:  Outpatient Encounter Medications as of 09/09/2021  Medication Sig   acetaminophen (TYLENOL) 500 MG tablet Take 500 mg by mouth every 6 (six) hours as needed.   atorvastatin (LIPITOR) 10 MG tablet Take 1 tablet (10 mg total) by mouth daily.   baclofen (LIORESAL) 10 MG tablet TAKE 2 TABLETS(20 MG) BY MOUTH THREE TIMES DAILY   dicyclomine (BENTYL) 10 MG capsule Take 1 capsule (10 mg total) by mouth 4 (four) times daily as needed for spasms.   gabapentin (NEURONTIN) 250 MG/5ML solution Take 2 mLs (100 mg total) by mouth at bedtime.   Heat Wraps (THERMACARE BACK/HIP) MISC 1 application by Does not apply route daily.   Hydroactive Dressings ([employer] HYDROCOLLOID PADS) PADS Apply 1 application topically daily.   lipase/protease/amylase (CREON) 12000-38000 units CPEP capsule Use 1 capsule with meals and snacks   LORazepam (ATIVAN) 0.5 MG tablet TAKE 1/2 TO 1 TABLET BY MOUTH AT BEDTIME AS NEEDED (Patient not taking: Reported on 07/18/2021)    megestrol (MEGACE) 40 MG tablet Take 1 tablet (40 mg total) by mouth daily.   meloxicam (MOBIC) 15 MG tablet TAKE 1/2 TO 1 TABLET(7.5 TO 15 MG) BY MOUTH DAILY AS NEEDED FOR PAIN   pantoprazole (PROTONIX) 40 MG tablet Take 1 tablet (40 mg total) by mouth 2 (two) times daily. TAKE 1 TABLET(40 MG) BY MOUTH TWICE DAILY   PARoxetine (PAXIL) 10 MG tablet TAKE 1 TABLET(10 MG) BY MOUTH DAILY   predniSONE (DELTASONE) 5 MG tablet Take 1 tablet (5 mg total) by mouth daily with breakfast. (Patient not taking: Reported on 07/18/2021)  promethazine (PHENERGAN) 25 MG tablet Take 0.5-1 tablets (12.5-25 mg total) by mouth every 6 (six) hours as needed for nausea or vomiting. (Patient not taking: Reported on 07/18/2021)   Teriparatide, Recombinant, (FORTEO) 600 MCG/2.4ML SOPN Inject 20 mcg into the skin daily.   tiZANidine (ZANAFLEX) 2 MG tablet Take 1 tablet (2 mg total) by mouth every 8 (eight) hours as needed for muscle spasms.   traMADol (ULTRAM) 50 MG tablet Take 1-2 tablets (50-100 mg total) by mouth every 12 (twelve) hours as needed.   traZODone (DESYREL) 100 MG tablet Take 1 tablet (100 mg total) by mouth at bedtime as needed for sleep.   No facility-administered encounter medications on file as of 09/09/2021.    ALLERGIES:  Allergies  Allergen Reactions   Abatacept Other (See Comments)   Codeine Other (See Comments)    Stomach pains/constipation- due to IBS   Golimumab Other (See Comments)   Hydrocodone Other (See Comments)    She does not want to take this due to intolerance to codeine.   Methylprednisolone Diarrhea and Other (See Comments)    "Made my IBS flare up"  Diarrhea, nausea   Methotrexate Derivatives Other (See Comments)    Agitation; pt stated, "I get no sleep; one dose caused no sleep for 7 days and 7 nights - that was when I got a 0.5 injection"   Prednisone Rash    Cannot take prednisone by mouth ---- red rash all over     PHYSICAL EXAM:  ECOG PERFORMANCE STATUS: 3 -  Symptomatic, >50% confined to bed  There were no vitals filed for this visit. There were no vitals filed for this visit. Physical Exam Constitutional:      Appearance: She is cachectic.     Comments: Weak and frail appearing on exam  HENT:     Head: Normocephalic and atraumatic.     Mouth/Throat:     Mouth: Mucous membranes are moist.  Eyes:     Extraocular Movements: Extraocular movements intact.     Pupils: Pupils are equal, round, and reactive to light.  Cardiovascular:     Rate and Rhythm: Regular rhythm. Tachycardia present.     Pulses: Normal pulses.     Heart sounds: Normal heart sounds.  Pulmonary:     Effort: Pulmonary effort is normal.     Breath sounds: Normal breath sounds.  Abdominal:     General: Bowel sounds are normal.     Palpations: Abdomen is soft.     Tenderness: There is no abdominal tenderness.  Musculoskeletal:        General: No swelling.     Right lower leg: No edema.     Left lower leg: No edema.  Lymphadenopathy:     Cervical: No cervical adenopathy.  Skin:    General: Skin is warm and dry.  Neurological:     General: No focal deficit present.     Mental Status: She is alert and oriented to person, place, and time.  Psychiatric:        Mood and Affect: Mood normal.        Behavior: Behavior normal.     LABORATORY DATA:  I have reviewed the labs as listed.  CBC    Component Value Date/Time   WBC 10.4 09/07/2021 1318   RBC 3.93 09/07/2021 1318   HGB 11.8 (L) 09/07/2021 1318   HGB 11.5 10/07/2020 1321   HCT 35.3 (L) 09/07/2021 1318   HCT 34.0 10/07/2020 1321   PLT 479 (  H) 09/07/2021 1318   PLT 462 (H) 10/07/2020 1321   MCV 89.8 09/07/2021 1318   MCV 91 10/07/2020 1321   MCH 30.0 09/07/2021 1318   MCHC 33.4 09/07/2021 1318   RDW 13.5 09/07/2021 1318   RDW 13.7 10/07/2020 1321   LYMPHSABS 1.9 09/07/2021 1318   LYMPHSABS 2.9 10/07/2020 1321   MONOABS 0.7 09/07/2021 1318   EOSABS 0.2 09/07/2021 1318   EOSABS 0.1 10/07/2020 1321    BASOSABS 0.1 09/07/2021 1318   BASOSABS 0.1 10/07/2020 1321   CMP Latest Ref Rng & Units 01/25/2021 01/04/2021 10/07/2020  Glucose 65 - 99 mg/dL 92 - -  BUN 7 - 25 mg/dL 9 - -  Creatinine 0.50 - 0.99 mg/dL 0.61 0.60 0.60  Sodium 135 - 146 mmol/L 137 - -  Potassium 3.5 - 5.3 mmol/L 4.0 - -  Chloride 98 - 110 mmol/L 99 - -  CO2 20 - 32 mmol/L 29 - -  Calcium 8.6 - 10.4 mg/dL 9.1 - -  Total Protein 6.1 - 8.1 g/dL 7.3 - -  Total Bilirubin 0.2 - 1.2 mg/dL 0.3 - -  Alkaline Phos 38 - 126 U/L - - -  AST 10 - 35 U/L 15 - -  ALT 6 - 29 U/L 12 - -    DIAGNOSTIC IMAGING:  I have independently reviewed the relevant imaging and discussed with the patient.  ASSESSMENT & PLAN: 1.  Normocytic anemia: Anemia of chronic disease +/- iron deficiency - History of significant iron deficiency with mild anemia (Hgb ranging from 11.0-12.0) - EGD (11/26/2020): Erythematous mucosa in gastric body, mildly ringed appearance in the esophagus, normal examined duodenum - Capsule endoscopy (02/08/2021): Complete capsule endoscopy with no findings to explain patient's symptoms - Colonoscopy (08/12/2020): Nonbleeding external and internal hemorrhoids, diverticulosis - CT enterography abdomen/pelvis (01/04/2021): No abnormal findings to explain chronic iron deficiency - We reviewed her prior anemia work-up which showed normal N36, folic acid and methylmalonic acid levels.  Copper levels were normal.  SPEP was negative. - Most recent IV iron with Feraheme on 04/22/2020 - No bright red blood per rectum or melena - She has severe fatigue, which is likely unrelated to her iron deficiency and anemia - Most recent labs (09/07/2021): Hgb 11.8/MCV 89.8, iron saturation 5% with low TIBC 240 and marginal ferritin 60 - Differential diagnosis favors anemia of chronic disease with functional iron deficiency and likely malabsorption secondary to chronic inflammation - PLAN: IV Venofer 200 mg x 5 - Repeat labs and RTC in 4 months   2.   Weight loss - Patient has had ongoing weight loss since 2020 - In January 2020, she weighed 89 pounds, has progressively lost down to 72 pounds in April 2022.  Patient reports that the most she has ever weighed was 103 pounds. - She follows with her PCP, GI, rheumatology, and endocrinology for severe IBS and weight loss - She is up-to-date on routine cancer screening such as colonoscopy and mammogram. - She does not have any clear focal symptoms that would require imaging at this time. - Due to strong family history of malignancy, she was previously recommended for [facility] testing, but this was not covered by insurance and was cost prohibitive. - She has had extensive previous imaging including PET scan in October 2021, all of which has been negative for malignancy - Weight loss suspected to be due to her underlying inflammatory condition - PLAN: Recommend continued follow-up with other specialists noted above.  3.  Leukocytosis & thrombocytosis -  Past CBCs reveal intermittent leukocytosis (neutrophil predominant) and thrombocytosis  - MPN work-up was negative for JAK2 V617F, JAK2 exon 12, MPL and CALR.  Reflex testing to NGS did not reveal any abnormal mutations. - BCR/ABL FISH was negative - Inflammatory markers (08/23/2020) significantly elevated with ESR 62 and CRP 1.7.  LDH was normal at 157. - Findings are consistent with chronic inflammation and reactive leukocytosis/thrombocytosis - No indication for bone marrow biopsy or further work-up at this time - Most recent CBC (09/07/2021): Platelets 479, normal WBC 10.4 - PLAN: Reactive leukocytosis/thrombocytosis, without indication for bone marrow biopsy or further work-up/treatment at this time.  4.  PET positive right axillary lymph node - PET scan obtained 07/09/2020 due to unintentional weight loss showed mildly hypermetabolic right axillary lymph node - Right axillar lymph node was palpable on exam (November 2021) - She has completed 5  days of azithromycin as she had a history of cat bite. - She was advised by Dr. Lorenso Courier that her lymph node was likely reactive and related to her underlying inflammatory disease, but patient insisted on biopsy. - Patient opted for right axillary lymph node biopsy (10/12/2020), which showed benign reactive lymph node, no malignancy  5.  PET positive ileocecal lesion - PET scan obtained 07/09/2020 for unintentional weight loss showed accentuated activity along the distalmost terminal ileum and ileocecal valve, possibly physiologic since there was no mass or specific abnormality in this region on diagnostic CT as of 06/23/2020. - Patient had colonoscopy on 08/12/2020, in which the cecum and IC valvle appeared normal. Terminal ileum could not be intubated due to restricted sigmoid and redundant colon. -CT abdomen/pelvis with contrast (01/04/2021): No abnormal bowel wall thickening or abnormal mural enhancement in the stomach, small bowel, or colon.  No findings to suggest inflammatory or chronic fibrous small bowel stricture.  No mesenteric or intraperitoneal free fluid.  Terminal ileum was normal, no edema or inflammation in the region of the cecum.  6.  Osteoporosis: - DEXA scan on 04/13/2020 shows T score -4.2. - She follows with endocrinology and orthopedics  7.  Rheumatoid arthritis - She follows with Dr. Amil Amen / Leafy Kindle of [facility] rheumatology   PLAN SUMMARY & DISPOSITION: IV Venofer 200 mg x 5 doses Labs and RTC in 4 months  All questions were answered. The patient knows to call the clinic with any problems, questions or concerns.  Medical decision making: Moderate  Time spent on visit: I spent 30 minutes counseling the patient face to face. The total time spent in the appointment was 55 minutes and more than 50% was on counseling.   Harriett Rush, PA-C  09/09/2021 12:53 PM

## 2021-09-08 NOTE — Telephone Encounter (Signed)
Received call back from pt. Advised that Dr. Lorenso Courier does not recommend IV iron at this time. Discussed oral iron with pt. She states she cannot take oral iron as it gives her diarrhea. Advised to try the best she can to eat iron rich foods and to continue to follow up with her PCP

## 2021-09-09 ENCOUNTER — Telehealth: Payer: Self-pay

## 2021-09-09 ENCOUNTER — Other Ambulatory Visit: Payer: Self-pay

## 2021-09-09 ENCOUNTER — Encounter (HOSPITAL_COMMUNITY): Payer: Self-pay | Admitting: Hematology

## 2021-09-09 ENCOUNTER — Inpatient Hospital Stay (HOSPITAL_BASED_OUTPATIENT_CLINIC_OR_DEPARTMENT_OTHER): Payer: Medicare HMO | Admitting: Physician Assistant

## 2021-09-09 VITALS — BP 91/56 | HR 103 | Temp 98.1°F | Resp 18 | Ht 58.74 in | Wt 73.2 lb

## 2021-09-09 DIAGNOSIS — D509 Iron deficiency anemia, unspecified: Secondary | ICD-10-CM

## 2021-09-09 DIAGNOSIS — R634 Abnormal weight loss: Secondary | ICD-10-CM

## 2021-09-09 NOTE — Patient Instructions (Signed)
[place] at [facility] Discharge Instructions  You were seen today by Tarri Abernethy PA-C for your iron deficiency.    LABS: Return in 4 months for repeat labs  OTHER TESTS: Continue follow-up with other specialists  TREATMENT: IV iron x5 doses  FOLLOW-UP APPOINTMENT: Office visit in 4 months, after labs   Thank you for choosing [place] at [facility] to provide your oncology and hematology care.  To afford each patient quality time with our provider, please arrive at least 15 minutes before your scheduled appointment time.   If you have a lab appointment with the [facility] please come in thru the Main Entrance and check in at the main information desk.  You need to re-schedule your appointment should you arrive 10 or more minutes late.  We strive to give you quality time with our providers, and arriving late affects you and other patients whose appointments are after yours.  Also, if you no show three or more times for appointments you may be dismissed from the clinic at the providers discretion.     Again, thank you for choosing [facility].  Our hope is that these requests will decrease the amount of time that you wait before being seen by our physicians.       _____________________________________________________________  Should you have questions after your visit to [facility], please contact our office at [phone] and follow the prompts.  Our office hours are 8:00 a.m. and 4:30 p.m. Monday - Friday.  Please note that voicemails left after 4:00 p.m. may not be returned until the following business day.  We are closed weekends and major holidays.  You do have access to a nurse 24-7, just call the main number to the clinic [phone] and do not press any options, hold on the line and a nurse will answer the phone.    For prescription refill requests, have your pharmacy contact our office and  allow 72 hours.    Due to Covid, you will need to wear a mask upon entering the hospital. If you do not have a mask, a mask will be given to you at the Main Entrance upon arrival. For doctor visits, patients may have 1 support person age 53 or older with them. For treatment visits, patients can not have anyone with them due to social distancing guidelines and our immunocompromised population.

## 2021-09-09 NOTE — Telephone Encounter (Signed)
VOB submitted for Monovisc, left knee. BV pending.

## 2021-09-12 ENCOUNTER — Inpatient Hospital Stay (HOSPITAL_COMMUNITY): Payer: Medicare HMO

## 2021-09-12 ENCOUNTER — Telehealth: Payer: Self-pay

## 2021-09-12 ENCOUNTER — Telehealth: Payer: Self-pay | Admitting: Orthopedic Surgery

## 2021-09-12 ENCOUNTER — Encounter (HOSPITAL_COMMUNITY): Payer: Self-pay

## 2021-09-12 ENCOUNTER — Other Ambulatory Visit: Payer: Self-pay

## 2021-09-12 ENCOUNTER — Telehealth: Payer: Self-pay | Admitting: Physician Assistant

## 2021-09-12 VITALS — BP 91/47 | HR 79 | Temp 98.0°F | Resp 18 | Wt 76.0 lb

## 2021-09-12 DIAGNOSIS — D509 Iron deficiency anemia, unspecified: Secondary | ICD-10-CM | POA: Diagnosis not present

## 2021-09-12 MED ORDER — ACETAMINOPHEN 325 MG PO TABS
650.0000 mg | ORAL_TABLET | Freq: Once | ORAL | Status: AC
  Administered 2021-09-12: 650 mg via ORAL
  Filled 2021-09-12: qty 2

## 2021-09-12 MED ORDER — LORATADINE 10 MG PO TABS
10.0000 mg | ORAL_TABLET | Freq: Once | ORAL | Status: DC
  Filled 2021-09-12: qty 1

## 2021-09-12 MED ORDER — SODIUM CHLORIDE 0.9 % IV SOLN: Freq: Once | INTRAVENOUS | Status: AC

## 2021-09-12 MED ORDER — SODIUM CHLORIDE 0.9 % IV SOLN
200.0000 mg | Freq: Once | INTRAVENOUS | Status: AC
  Administered 2021-09-12: 200 mg via INTRAVENOUS
  Filled 2021-09-12: qty 200

## 2021-09-12 NOTE — Progress Notes (Signed)
Patient presents today for Feraheme infusion. Vital signs stable. Patient has complaints of chronic pain she rates a 6/10 due to arthritis.   Patient refused Claritin 10 mg PO for a pre-med. Reported refusal to [employer] PA and [facility]. Orders received to proceed with treatment. See documentation on MAR.   Blood pressure low at discharge . Reported to [facility] [facility] PA. Message received to discharge home. Patient asymptomatic.   Treatment given today per MD orders. Tolerated infusion without adverse affects. Vital signs stable. No complaints at this time. Discharged from clinic by wheel chair in stable condition. Alert and oriented x 3. F/U with [facility] as scheduled.

## 2021-09-12 NOTE — Telephone Encounter (Signed)
Patient called needing rx filled for Hydrocodone. The number to contact patient is [phone]

## 2021-09-12 NOTE — Telephone Encounter (Signed)
I can do a temporary refill of Tramadol but I dont think hydrocodone is a good idea for her

## 2021-09-12 NOTE — Telephone Encounter (Signed)
Can you please call her and schedule with him?

## 2021-09-12 NOTE — Telephone Encounter (Signed)
Pt calling stating she is in a lot of shoulder pain and wanted to know if Artis Delay or Ninfa Linden could get her in any sooner this week. Artis Delay had the first opening next Tuesday so she did get sch'd for that day. The best call back number is [phone].

## 2021-09-12 NOTE — Telephone Encounter (Signed)
Called and left a VM for patient to CB to schedule for gel injection after 09/17/2021 with Dr. Marlou Sa.  Approved for Monovisc, left knee. Buy & Bill Covered at 100% through insurance. No Co-pay  No PA required

## 2021-09-12 NOTE — Telephone Encounter (Signed)
Patient called asked if the [facility] [address] information be removed. Patient said she no longer use that pharmacy. Please see previous note.    [phone]

## 2021-09-12 NOTE — Patient Instructions (Signed)
[place]  Discharge Instructions: Thank you for choosing [facility] to provide your oncology and hematology care.  If you have a lab appointment with the [facility], please come in thru the Main Entrance and check in at the main information desk.  Wear comfortable clothing and clothing appropriate for easy access to any Portacath or PICC line.   We strive to give you quality time with your provider. You may need to reschedule your appointment if you arrive late (15 or more minutes).  Arriving late affects you and other patients whose appointments are after yours.  Also, if you miss three or more appointments without notifying the office, you may be dismissed from the clinic at the provider's discretion.      For prescription refill requests, have your pharmacy contact our office and allow 72 hours for refills to be completed.    Today you received the following: Venofer 200 mg IV.      To help prevent nausea and vomiting after your treatment, we encourage you to take your nausea medication as directed.  BELOW ARE SYMPTOMS THAT SHOULD BE REPORTED IMMEDIATELY: *FEVER GREATER THAN 100.4 F (38 C) OR HIGHER *CHILLS OR SWEATING *NAUSEA AND VOMITING THAT IS NOT CONTROLLED WITH YOUR NAUSEA MEDICATION *UNUSUAL SHORTNESS OF BREATH *UNUSUAL BRUISING OR BLEEDING *URINARY PROBLEMS (pain or burning when urinating, or frequent urination) *BOWEL PROBLEMS (unusual diarrhea, constipation, pain near the anus) TENDERNESS IN MOUTH AND THROAT WITH OR WITHOUT PRESENCE OF ULCERS (sore throat, sores in mouth, or a toothache) UNUSUAL RASH, SWELLING OR PAIN  UNUSUAL VAGINAL DISCHARGE OR ITCHING   Items with * indicate a potential emergency and should be followed up as soon as possible or go to the Emergency Department if any problems should occur.  Please show the CHEMOTHERAPY ALERT CARD or IMMUNOTHERAPY ALERT CARD at check-in to the Emergency Department and triage  nurse.  Should you have questions after your visit or need to cancel or reschedule your appointment, please contact [facility] [phone]  and follow the prompts.  Office hours are 8:00 a.m. to 4:30 p.m. Monday - Friday. Please note that voicemails left after 4:00 p.m. may not be returned until the following business day.  We are closed weekends and major holidays. You have access to a nurse at all times for urgent questions. Please call the main number to the clinic [phone] and follow the prompts.  For any non-urgent questions, you may also contact your provider using MyChart. We now offer e-Visits for anyone 6 and older to request care online for non-urgent symptoms. For details visit [email or web address].   Also download the MyChart app! Go to the app store, search "MyChart", open the app, select [place], and log in with your MyChart username and password.  Due to Covid, a mask is required upon entering the hospital/clinic. If you do not have a mask, one will be given to you upon arrival. For doctor visits, patients may have 1 support person aged 38 or older with them. For treatment visits, patients cannot have anyone with them due to current Covid guidelines and our immunocompromised population.

## 2021-09-12 NOTE — Telephone Encounter (Signed)
Got her sch'd for 09/13/21 at 9:30am

## 2021-09-13 ENCOUNTER — Ambulatory Visit (HOSPITAL_BASED_OUTPATIENT_CLINIC_OR_DEPARTMENT_OTHER): Payer: Medicare HMO | Admitting: Orthopaedic Surgery

## 2021-09-13 NOTE — Telephone Encounter (Signed)
I tried calling. No answer. LMVM advising per below. Advised she needed to return call to let us know if she wanted rx for tramadol.

## 2021-09-14 ENCOUNTER — Ambulatory Visit (HOSPITAL_COMMUNITY): Payer: Medicare HMO

## 2021-09-15 ENCOUNTER — Telehealth: Payer: Self-pay | Admitting: Physician Assistant

## 2021-09-15 NOTE — Telephone Encounter (Signed)
Pt calling crying stating she is in severe pain and believes right shoulder muscle is torn. Pt sch'd a sooner appt with Artis Delay for next Thursday the 22nd and asked for a stronger pain medication to help with pain until she can come to that appt with him. The best pharmacy is [facility] [phone] - [place], [address] and the best call back number is [phone].

## 2021-09-15 NOTE — Telephone Encounter (Signed)
Per Terri she was referred to pain management but I also saw that Dr. Marlou Sa just gave her some medication.

## 2021-09-16 ENCOUNTER — Other Ambulatory Visit: Payer: Self-pay | Admitting: Surgical

## 2021-09-16 MED ORDER — TRAMADOL HCL 50 MG PO TABS: 50.0000 mg | ORAL_TABLET | Freq: Two times a day (BID) | ORAL | 0 refills | Status: DC | PRN

## 2021-09-16 NOTE — Telephone Encounter (Signed)
Sent in tramadol since according to chart, I dont see  any refills by dr dean

## 2021-09-16 NOTE — Telephone Encounter (Signed)
I left voicemail for patient advising. 

## 2021-09-20 ENCOUNTER — Ambulatory Visit: Payer: Medicare HMO | Admitting: Physician Assistant

## 2021-09-21 ENCOUNTER — Ambulatory Visit (HOSPITAL_COMMUNITY): Payer: Medicare HMO

## 2021-09-22 ENCOUNTER — Other Ambulatory Visit: Payer: Self-pay

## 2021-09-22 ENCOUNTER — Ambulatory Visit (INDEPENDENT_AMBULATORY_CARE_PROVIDER_SITE_OTHER): Payer: Medicare HMO | Admitting: Physician Assistant

## 2021-09-22 ENCOUNTER — Encounter: Payer: Self-pay | Admitting: Physician Assistant

## 2021-09-22 DIAGNOSIS — M25511 Pain in right shoulder: Secondary | ICD-10-CM

## 2021-09-22 DIAGNOSIS — M25512 Pain in left shoulder: Secondary | ICD-10-CM | POA: Diagnosis not present

## 2021-09-22 DIAGNOSIS — G8929 Other chronic pain: Secondary | ICD-10-CM | POA: Diagnosis not present

## 2021-09-22 DIAGNOSIS — M1712 Unilateral primary osteoarthritis, left knee: Secondary | ICD-10-CM | POA: Diagnosis not present

## 2021-09-22 MED ORDER — HYALURONAN 88 MG/4ML IX SOSY
88.0000 mg | PREFILLED_SYRINGE | INTRA_ARTICULAR | Status: AC | PRN
  Administered 2021-09-22: 11:00:00 88 mg via INTRA_ARTICULAR

## 2021-09-22 MED ORDER — LIDOCAINE HCL 1 % IJ SOLN
3.0000 mL | INTRAMUSCULAR | Status: AC | PRN
  Administered 2021-09-22: 11:00:00 3 mL

## 2021-09-22 MED ORDER — METHYLPREDNISOLONE ACETATE 40 MG/ML IJ SUSP
40.0000 mg | INTRAMUSCULAR | Status: AC | PRN
  Administered 2021-09-22: 11:00:00 40 mg via INTRA_ARTICULAR

## 2021-09-22 MED ORDER — LIDOCAINE HCL 1 % IJ SOLN
3.0000 mL | INTRAMUSCULAR | Status: AC | PRN
Start: 2021-09-22 — End: 2021-09-22
  Administered 2021-09-22: 11:00:00 3 mL

## 2021-09-22 NOTE — Progress Notes (Signed)
° °  Procedure Note  Patient: Vanessa Lara             Date of Birth: 07/15/1956           MRN: [id number]             Visit Date: 09/22/2021 HPI: Vanessa Lara comes in today for scheduled left knee Monovisc injection.  She has known osteoarthritis left knee.  She is tried cortisone injections with minimal relief.  She has no upcoming surgery for the left knee.  No new injury to the left knee. She is also complaining bilateral shoulder pain.  She has chronic bilateral shoulder pain.  She had previous MRI both shoulders which showed high-grade partial thickness articular tear involving the right supraspinatus and partial bursal tear involving the left supraspinatus.  Severe tendinosis infraspinatus bilaterally.  She has had no known injury to either shoulder.  She states she is having increased pain in both.  Review of systems :Denies any recent fevers chills or vaccines.  Procedures: Visit Diagnoses:  1. Primary osteoarthritis of left knee   2. Chronic right shoulder pain   3. Chronic left shoulder pain     Large Joint Inj: bilateral subacromial bursa on 09/22/2021 10:46 AM Indications: pain Details: 22 G 1.5 in needle, superior approach  Arthrogram: No  Medications (Right): 3 mL lidocaine 1 %; 40 mg methylPREDNISolone acetate 40 MG/ML Medications (Left): 3 mL lidocaine 1 %; 40 mg methylPREDNISolone acetate 40 MG/ML Outcome: tolerated well, no immediate complications Procedure, treatment alternatives, risks and benefits explained, specific risks discussed. Consent was given by the patient. Immediately prior to procedure a time out was called to verify the correct patient, procedure, equipment, support staff and site/side marked as required. Patient was prepped and draped in the usual sterile fashion.    Large Joint Inj: L knee on 09/22/2021 10:56 AM Indications: pain Details: 22 G 1.5 in needle, anterolateral approach  Arthrogram: No  Medications: 40 mg methylPREDNISolone acetate  40 MG/ML; 88 mg Hyaluronan 88 MG/4ML Outcome: tolerated well, no immediate complications Procedure, treatment alternatives, risks and benefits explained, specific risks discussed. Consent was given by the patient. Immediately prior to procedure a time out was called to verify the correct patient, procedure, equipment, support staff and site/side marked as required. Patient was prepped and draped in the usual sterile fashion.   Physical exam: Left knee no abnormal warmth erythema or effusion. Bilateral shoulder she has fluid passive range of motion both shoulders but extremes of abduction and forward flexion both shoulders because significant pain.  With external/internal rotation she has 5 out of 5 strength bilaterally.  Unable to perform other shoulder testing secondary to pain.  There is no abnormal warmth erythema ecchymosis or erythema about bilateral shoulder girdles.  Plan: She understands she needs to wait at least 3 months between shoulder injections in 6 months between supplemental injections.  Left knee.  She is opposed to doing any therapy on the shoulders at this point time.  Follow-up as needed.

## 2021-09-28 ENCOUNTER — Other Ambulatory Visit: Payer: Self-pay

## 2021-09-28 ENCOUNTER — Encounter (HOSPITAL_COMMUNITY): Payer: Self-pay

## 2021-09-28 ENCOUNTER — Inpatient Hospital Stay (HOSPITAL_COMMUNITY): Payer: Medicare HMO

## 2021-09-28 VITALS — BP 91/45 | HR 82 | Temp 98.1°F | Resp 18

## 2021-09-28 DIAGNOSIS — D509 Iron deficiency anemia, unspecified: Secondary | ICD-10-CM

## 2021-09-28 MED ORDER — ACETAMINOPHEN 325 MG PO TABS
650.0000 mg | ORAL_TABLET | Freq: Once | ORAL | Status: AC
  Administered 2021-09-28: 14:00:00 650 mg via ORAL
  Filled 2021-09-28: qty 2

## 2021-09-28 MED ORDER — SODIUM CHLORIDE 0.9 % IV SOLN: Freq: Once | INTRAVENOUS | Status: AC

## 2021-09-28 MED ORDER — LORATADINE 10 MG PO TABS: 10.0000 mg | ORAL_TABLET | Freq: Once | ORAL | Status: DC

## 2021-09-28 MED ORDER — SODIUM CHLORIDE 0.9 % IV SOLN
200.0000 mg | Freq: Once | INTRAVENOUS | Status: AC
  Administered 2021-09-28: 15:00:00 200 mg via INTRAVENOUS
  Filled 2021-09-28: qty 10

## 2021-09-28 NOTE — Patient Instructions (Signed)
[facility]  Discharge Instructions: Thank you for choosing [facility] to provide your oncology and hematology care.  If you have a lab appointment with the Cancer Center, please come in thru the Main Entrance and check in at the main information desk.  Wear comfortable clothing and clothing appropriate for easy access to any Portacath or PICC line.   We strive to give you quality time with your provider. You may need to reschedule your appointment if you arrive late (15 or more minutes).  Arriving late affects you and other patients whose appointments are after yours.  Also, if you miss three or more appointments without notifying the office, you may be dismissed from the clinic at the provider's discretion.      For prescription refill requests, have your pharmacy contact our office and allow 72 hours for refills to be completed.    Today you received the following: Venofer, return as scheduled.   To help prevent nausea and vomiting after your treatment, we encourage you to take your nausea medication as directed.  BELOW ARE SYMPTOMS THAT SHOULD BE REPORTED IMMEDIATELY: *FEVER GREATER THAN 100.4 F (38 C) OR HIGHER *CHILLS OR SWEATING *NAUSEA AND VOMITING THAT IS NOT CONTROLLED WITH YOUR NAUSEA MEDICATION *UNUSUAL SHORTNESS OF BREATH *UNUSUAL BRUISING OR BLEEDING *URINARY PROBLEMS (pain or burning when urinating, or frequent urination) *BOWEL PROBLEMS (unusual diarrhea, constipation, pain near the anus) TENDERNESS IN MOUTH AND THROAT WITH OR WITHOUT PRESENCE OF ULCERS (sore throat, sores in mouth, or a toothache) UNUSUAL RASH, SWELLING OR PAIN  UNUSUAL VAGINAL DISCHARGE OR ITCHING   Items with * indicate a potential emergency and should be followed up as soon as possible or go to the Emergency Department if any problems should occur.  Please show the CHEMOTHERAPY ALERT CARD or IMMUNOTHERAPY ALERT CARD at check-in to the Emergency Department and triage  nurse.  Should you have questions after your visit or need to cancel or reschedule your appointment, please contact [facility] [phone]  and follow the prompts.  Office hours are 8:00 a.m. to 4:30 p.m. Monday - Friday. Please note that voicemails left after 4:00 p.m. may not be returned until the following business day.  We are closed weekends and major holidays. You have access to a nurse at all times for urgent questions. Please call the main number to the clinic [phone] and follow the prompts.  For any non-urgent questions, you may also contact your provider using MyChart. We now offer e-Visits for anyone 18 and older to request care online for non-urgent symptoms. For details visit [email or web address].   Also download the MyChart app! Go to the app store, search "MyChart", open the app, select , and log in with your MyChart username and password.  Due to Covid, a mask is required upon entering the hospital/clinic. If you do not have a mask, one will be given to you upon arrival. For doctor visits, patients may have 1 support person aged 18 or older with them. For treatment visits, patients cannot have anyone with them due to current Covid guidelines and our immunocompromised population.  

## 2021-09-28 NOTE — Progress Notes (Signed)
Patient tolerated iron infusion with no complaints voiced.  Peripheral IV site clean and dry with good blood return noted before and after infusion.  Band aid applied.  VSS with discharge and left in satisfactory condition with no s/s of distress noted.   

## 2021-09-30 ENCOUNTER — Ambulatory Visit (HOSPITAL_COMMUNITY): Payer: Medicare HMO

## 2021-10-05 ENCOUNTER — Ambulatory Visit (HOSPITAL_COMMUNITY): Payer: Medicare HMO

## 2021-10-07 ENCOUNTER — Other Ambulatory Visit: Payer: Self-pay

## 2021-10-07 ENCOUNTER — Ambulatory Visit (INDEPENDENT_AMBULATORY_CARE_PROVIDER_SITE_OTHER): Payer: Medicare HMO | Admitting: Surgical

## 2021-10-07 VITALS — Wt 70.6 lb

## 2021-10-07 DIAGNOSIS — M25562 Pain in left knee: Secondary | ICD-10-CM | POA: Diagnosis not present

## 2021-10-07 MED ORDER — FORTEO 600 MCG/2.4ML ~~LOC~~ SOPN: 20.0000 ug | PEN_INJECTOR | Freq: Every day | SUBCUTANEOUS | 0 refills | Status: DC

## 2021-10-09 ENCOUNTER — Encounter: Payer: Self-pay | Admitting: Surgical

## 2021-10-09 NOTE — Progress Notes (Signed)
Office Visit Note   Patient: Vanessa Lara           Date of Birth: 12-23-1955           MRN: [id number] Visit Date: 10/07/2021 Requested by: Pablo Lawrence, NP [place],  [place] [id number] PCP: Pablo Lawrence, NP  Subjective: No chief complaint on file.   HPI: Vanessa Lara is a 66 y.o. female who presents to the office complaining of multiple complaints.  Mainly complains of left knee pain and right shoulder pain.  She states she "hurts all over".  She had subacromial injection recently in the right shoulder without relief.  She also had recent bilateral knee injections by another provider in the practice.  Right knee is doing well but the left knee has not really responded at all to her injection.  She describes popping sensation in the left knee as well as anterior pain.  She has history of left knee osteoarthritis and the last MRI from late summer last year demonstrated concern for insufficiency fracture in the left knee.  She has history of severe osteoporosis.  She is also continuing to lose weight and weighs 70.2 pounds today which is down about 6 pounds from her last weigh-in about a month ago..                ROS: All systems reviewed are negative as they relate to the chief complaint within the history of present illness.  Patient denies fevers or chills.  Assessment & Plan: Visit Diagnoses:  1. Left knee pain, unspecified chronicity     Plan: Patient is a 66 year old female who presents for repeat evaluation of multiple joint complaints.  Mainly complaining of left knee and right shoulder pain today.  Recent joint injections have not provided any good relief from her symptoms except for the right knee injection that she had.  Left knee had questionable developing insufficiency fracture on the last MRI.  With continuing difficulty weightbearing and no response to other conservative treatments, plan to order MRI of the left knee for repeat evaluation for  the insufficiency fracture.  reordered Forteo for treatment of osteoporosis.  Also reviewed last MRI of the right shoulder with her which did not show any indication for operative management and with her currently ongoing weight loss and significantly decreasing BMI, do not think that surgery is recommended for her at this time.  I did notice in the chart today that her primary care provider has ordered CT of the abdomen and pelvis.  Patient was given an exercise band and some exercises to do for rotator cuff strengthening to hopefully help with the pain that she is having from her chronic rotator cuff damage and tendinosis.  Follow-up after MRI to review results.  Follow-Up Instructions: No follow-ups on file.   Orders:  Orders Placed This Encounter  Procedures   MR Knee Left w/o contrast   Meds ordered this encounter  Medications   Teriparatide, Recombinant, (FORTEO) 600 MCG/2.4ML SOPN    Sig: Inject 20 mcg into the skin daily.    Dispense:  2.4 mL    Refill:  0      Procedures: No procedures performed   Clinical Data: No additional findings.  Objective: Vital Signs: Wt 70 lb 9.6 oz (32 kg) Comment: with clothes and boots on   BMI 14.39 kg/m   Physical Exam:  Constitutional: Patient appears well-developed HEENT:  Head: Normocephalic Eyes:EOM are normal Neck:  Normal range of motion Cardiovascular: Normal rate Pulmonary/chest: Effort normal Neurologic: Patient is alert Skin: Skin is warm Psychiatric: Patient has normal mood and affect  Ortho Exam: Ortho exam demonstrates left knee with 0 degrees extension and greater than 90 degrees of knee flexion.  No knee effusion noted.  Tenderness over the medial and lateral compartments, primarily over the joint line and the medial/lateral femoral condyles.  Able to actively extend the left knee and perform straight leg raise.  No pain with hip range of motion.  Right shoulder with preserved active and passive range of motion.  Active  range of motion equivalent passive range of motion.  Good strength rated 5/5 to supraspinatus, infraspinatus, subscapularis but infraspinatus and supraspinatus strength testing does cause pain.  Specialty Comments:  No specialty comments available.  Imaging: No results found.   PMFS History: Patient Active Problem List   Diagnosis Date Noted   Loss of weight 05/18/2021   Protein-calorie malnutrition, severe ([place]) 05/18/2021   Osteoporosis 06/15/2020   Iron deficiency anemia 04/09/2020   Primary osteoarthritis involving multiple joints 12/31/2018   Rheumatoid arthritis involving multiple sites with positive rheumatoid factor ([place]) 12/31/2018   Irritable bowel syndrome with both constipation and diarrhea 10/28/2018   Constipation 03/14/2016   Mitral valve prolapse 11/23/2015   High cholesterol 11/23/2015   Past Medical History:  Diagnosis Date   Anemia    Anxiety    Arthritis    RA   Cancer (HCC)    Skin   CHF (congestive heart failure) (HCC)    Collagen vascular disease (HCC)    Depression    Endometriosis    High cholesterol    IBS (irritable bowel syndrome)    Lymphadenopathy    Mitral valve prolapse    RA (rheumatoid arthritis) (HCC)    Rotator cuff tear    bilateral   Uterus, adenomyosis     Family History  Problem Relation Age of Onset   Colon cancer Other    Bone cancer Mother    Brain cancer Father    Brain cancer Sister    Breast cancer Sister    Renal Disease Sister    Pancreatic cancer Neg Hx    Esophageal cancer Neg Hx    Stomach cancer Neg Hx    Rectal cancer Neg Hx     Past Surgical History:  Procedure Laterality Date   AXILLARY LYMPH NODE BIOPSY Right 10/12/2020   Procedure: RIGHT AXILLARY LYMPH NODE BIOPSY EXCISION;  Surgeon: Stark Klein, MD;  Location: [facility];  Service: General;  Laterality: Right;   BUNIONECTOMY Right    CARPAL TUNNEL RELEASE Right    COLONOSCOPY     COLONOSCOPY N/A 12/13/2015   Procedure:  COLONOSCOPY;  Surgeon: Rogene Houston, MD;  [facility];  Service: Endoscopy;  Laterality: N/A;  9:55   Fatty tumor     2011 (left arm)   FOOT SURGERY     hammertoe   TONSILLECTOMY     TOTAL ABDOMINAL HYSTERECTOMY     2003   Social History   Occupational History   Occupation: retired  Tobacco Use   Smoking status: Former    Packs/day: 0.50    Years: 33.00    Pack years: 16.50    Types: Cigarettes   Smokeless tobacco: Never   Tobacco comments:    quit 9 yrs ago  Vaping Use   Vaping Use: Never used  Substance and Sexual Activity   Alcohol use: No  Alcohol/week: 0.0 standard drinks   Drug use: No   Sexual activity: Not Currently

## 2021-10-10 ENCOUNTER — Encounter (HOSPITAL_COMMUNITY): Payer: Self-pay | Admitting: Hematology

## 2021-10-10 ENCOUNTER — Inpatient Hospital Stay (HOSPITAL_COMMUNITY): Payer: Medicare HMO | Attending: Hematology

## 2021-10-10 ENCOUNTER — Ambulatory Visit (HOSPITAL_COMMUNITY): Payer: Medicare HMO

## 2021-10-12 ENCOUNTER — Other Ambulatory Visit: Payer: Self-pay

## 2021-10-12 ENCOUNTER — Telehealth: Payer: Self-pay | Admitting: Surgical

## 2021-10-12 ENCOUNTER — Inpatient Hospital Stay (HOSPITAL_COMMUNITY): Payer: Medicare HMO

## 2021-10-12 DIAGNOSIS — G8929 Other chronic pain: Secondary | ICD-10-CM

## 2021-10-12 NOTE — Telephone Encounter (Signed)
Patient called asked if she is still going to be set up for an MRI on her right shoulder and upper right arm. Patient said her shoulder and arm hurt worse than her knee. The number to contact patient is [phone]

## 2021-10-12 NOTE — Telephone Encounter (Signed)
IC s/w patient. Scan has been ordered. She will follow up with [facility] after scan.

## 2021-10-12 NOTE — Telephone Encounter (Signed)
Okay for this if she wants it but I dont think she is a surgical candidate even if it shows new pathology

## 2021-10-14 ENCOUNTER — Telehealth: Payer: Self-pay

## 2021-10-14 NOTE — Telephone Encounter (Signed)
Vanessa Lara with [facility] scheduling sent an email about the patient's upcoming knee MRI:   The patient reports that she had an iron infusion 2 weeks ago. The MR tech at [facility] said that she would need to wait 3 months before having an MRI, unless okayed by both the ordering provider and the radiologist.   The patient does not wish to wait 3 months and is asking for a CT, instead. Would that be an appropriate test, or does she need to wait. If a CT is ok, would need a new order.  Please advise.

## 2021-10-14 NOTE — Telephone Encounter (Signed)
Looks like she had Venofer infusion which by my googling seems to only take 1 to 2 weeks until she is okay to have MRI.  This is in contrast other iron infusions some of which need 3 months until patient can have MRI.  I have called the company and left a message with their pharmacist with this question and they will call me back in the near future on whether or not they can have MRI scan.  I do not think a CT scan would be helpful for the reason we are ordering the scan.    Can you send this message back so I will remember to follow-up with you when I get the call about MRI after Venofer?

## 2021-10-14 NOTE — Telephone Encounter (Signed)
I spoke with the pharmacist for [facility] who informed me that they do not have any contraindications or cautions against MRI scanning following Venofer infusion and they provided me with a study that recommends 1 week washout period after this specific iron infusion before MRI scan.  From my perspective it seems okay to proceed with MRI scan but I would leave it ultimately up to the radiologist since they probably see the situation more often than I do.  If they do not want to proceed then we just have to wait 2-3 months

## 2021-10-17 ENCOUNTER — Inpatient Hospital Stay (HOSPITAL_COMMUNITY): Payer: Medicare HMO

## 2021-10-17 ENCOUNTER — Telehealth: Payer: Self-pay | Admitting: Surgical

## 2021-10-17 NOTE — Telephone Encounter (Signed)
This message has been emailed back to April Pait and Rhys Martini.

## 2021-10-17 NOTE — Telephone Encounter (Signed)
Pt called stating she was supposed to have an MRI for her shoulder and knee but because she got an iron infusion she's being told she'll have to wait for 3 months. Pt states her knee doesn't hurt that bad but her shoulder is in a lot of pain and she would like to know if our office can override the 3 month wait? Pt states she believes this is Government social research officer and she would like a CB with an update after we speak to them.   [phone]

## 2021-10-17 NOTE — Telephone Encounter (Signed)
I sent a message to Karna Christmas about this on Friday, not sure what the consensus is as far as the radiologist is concerned

## 2021-10-18 NOTE — Telephone Encounter (Signed)
Lvm informing pt.

## 2021-10-19 ENCOUNTER — Telehealth: Payer: Self-pay | Admitting: Surgical

## 2021-10-19 DIAGNOSIS — M1712 Unilateral primary osteoarthritis, left knee: Secondary | ICD-10-CM

## 2021-10-19 DIAGNOSIS — G8929 Other chronic pain: Secondary | ICD-10-CM

## 2021-10-19 DIAGNOSIS — M1711 Unilateral primary osteoarthritis, right knee: Secondary | ICD-10-CM

## 2021-10-19 NOTE — Telephone Encounter (Signed)
Patient called advised she spoke with Forestine Na and was told since she iron infusions until after three months have gone by. Patient asked if there is a pill that she can take until she can get the MRI.  Patient said she can not take Prednisone. Patient said her right upper arm is hurting her really bad. The number to contact patient is [phone]

## 2021-10-21 NOTE — Telephone Encounter (Signed)
I spoke with Vanessa Lara on the phone.  She states that she had her rheumatoid factor checked recently and was around 174, she has appointment with rheumatologist in August.  Do think that she would have sooner appointment if we referred her to see Dr Estanislado Pandy?  If so can you place referral.  Also, she states she would like to try steroid Dosepak.  She has tolerated prednisone in the past though it has given her rash.  She will stop the medicine if she notices any side effects.  Can you send in?  Thank you

## 2021-10-22 ENCOUNTER — Other Ambulatory Visit: Payer: Self-pay | Admitting: Surgical

## 2021-10-24 ENCOUNTER — Other Ambulatory Visit: Payer: Self-pay | Admitting: Surgical

## 2021-10-24 ENCOUNTER — Telehealth: Payer: Self-pay

## 2021-10-24 MED ORDER — PREDNISONE 5 MG (21) PO TBPK: ORAL_TABLET | ORAL | 0 refills | Status: DC

## 2021-10-24 NOTE — Telephone Encounter (Signed)
Patient called in stating that her pharm doesn't have any medication that was sent in for her.   She stated it was steroid patches ? Please advise

## 2021-10-24 NOTE — Telephone Encounter (Signed)
Looks like it was never sent on Friday, I'll send now

## 2021-10-25 NOTE — Telephone Encounter (Signed)
Okay by me.

## 2021-10-29 ENCOUNTER — Ambulatory Visit (HOSPITAL_COMMUNITY)
Admission: RE | Admit: 2021-10-29 | Discharge: 2021-10-29 | Disposition: A | Discharge status: Home / Self Care | Payer: Medicare HMO | Source: Ambulatory Visit | Attending: Surgical | Admitting: Surgical

## 2021-10-29 DIAGNOSIS — M25562 Pain in left knee: Secondary | ICD-10-CM | POA: Insufficient documentation

## 2021-10-29 DIAGNOSIS — M25511 Pain in right shoulder: Secondary | ICD-10-CM | POA: Diagnosis not present

## 2021-10-29 DIAGNOSIS — G8929 Other chronic pain: Secondary | ICD-10-CM

## 2021-10-31 ENCOUNTER — Telehealth: Payer: Self-pay | Admitting: Surgical

## 2021-10-31 NOTE — Telephone Encounter (Addendum)
Received call from Dominica Severin with Palliative Care needing a call back. Suanne Marker said patient is under symptoms management in palliative care. Suanne Marker advised patient was prescribed a steroid pack and wanted to talk about that. The number to contact Suanne Marker is [phone]

## 2021-11-01 ENCOUNTER — Ambulatory Visit (HOSPITAL_COMMUNITY): Payer: Medicare HMO

## 2021-11-02 ENCOUNTER — Other Ambulatory Visit: Payer: Self-pay

## 2021-11-02 ENCOUNTER — Ambulatory Visit (INDEPENDENT_AMBULATORY_CARE_PROVIDER_SITE_OTHER): Payer: Medicare HMO | Admitting: Orthopedic Surgery

## 2021-11-02 VITALS — Wt 73.8 lb

## 2021-11-02 DIAGNOSIS — M170 Bilateral primary osteoarthritis of knee: Secondary | ICD-10-CM | POA: Diagnosis not present

## 2021-11-02 DIAGNOSIS — G8929 Other chronic pain: Secondary | ICD-10-CM | POA: Diagnosis not present

## 2021-11-02 DIAGNOSIS — M25511 Pain in right shoulder: Secondary | ICD-10-CM

## 2021-11-02 DIAGNOSIS — M1711 Unilateral primary osteoarthritis, right knee: Secondary | ICD-10-CM | POA: Diagnosis not present

## 2021-11-02 DIAGNOSIS — M1712 Unilateral primary osteoarthritis, left knee: Secondary | ICD-10-CM

## 2021-11-02 NOTE — Telephone Encounter (Signed)
Spoke with you about this but after further chart review and discussion with patient, do not think HHPT will help her significant problem of ongoing fatigue even getting out of bed and a lot of this problem likely stems from her malnourishment and continued weight loss which her PCP is trying to improve

## 2021-11-02 NOTE — Telephone Encounter (Signed)
I called and spoke with Suanne Marker today. Dr Marlou Sa saw [name] this afternoon

## 2021-11-03 ENCOUNTER — Encounter: Payer: Self-pay | Admitting: Orthopedic Surgery

## 2021-11-03 NOTE — Progress Notes (Signed)
Office Visit Note   Patient: Vanessa Lara           Date of Birth: [birth year]           MRN: [id number] Visit Date: 11/02/2021 Requested by: Vanessa Lawrence, NP Vanessa Lara,  Vanessa Lara [id number] PCP: Vanessa Lawrence, NP  Subjective: Chief Complaint  Patient presents with   Other    Scan review    HPI: Vanessa Lara is a patient here to follow-up left knee MRI and right shoulder MRI.  She also has multiple orthopedic complaints.  She states she has pain all over.  She is using a lidocaine patch for the shoulder.  She sees rheumatologist on March 6.  She is asking about fentanyl patch which we really do not do.  Her left knee MRI scan is reviewed.  She had previously demonstrated T2 hyperintensity in the lateral femoral condyle which has improved.  She does have fairly advanced chondral thinning on that medial side but no meniscal pathology present no ligament tears.  MRI scan of the shoulder is also reviewed with her.  This shows improvement in insertional tendinosis of the infraspinatus tendon.  No full-thickness rotator cuff tear.  She has had interval rupture of the long head of the biceps tendon retracted now into the biceps groove.  She does have a small shoulder joint effusion.  Overall Vanessa Lara is fairly miserable in her current condition due to systemic manifestations of rheumatoid arthritis.  Vanessa Lara has moved heparin and earth to get Vanessa Lara in to see Dr. Benjamine Lara in March.              ROS: All systems reviewed are negative as they relate to the chief complaint within the history of present illness.  Patient denies  fevers or chills.   Assessment & Plan: Visit Diagnoses:  1. Primary osteoarthritis of left knee   2. Primary osteoarthritis of right knee   3. Chronic right shoulder pain     Plan: Impression is biceps tendon rupture in the right shoulder which theoretically should give her limited time discomfort and should improve.  She does not have too much of a biceps  Popeye deformity.  Shoulder range of motion and strength otherwise intact.  No operative problem in the shoulder and no operative problem in the knee.  Her global pain is something that may have to be addressed by rheumatologist.  She has tried to [employer]olog[employer] with 1 episode of congestive heart failure following initiation of treatment.  She is interested in trying some other type of medication but is concerned about its side effects.  From an orthopedic standpoint she has no operative problem in her joints and will follow-up with [place] as needed.  Follow-Up Instructions: Return if symptoms worsen or fail to improve.   Orders:  No orders of the defined types were placed in this encounter.  No orders of the defined types were placed in this encounter.     Procedures: No procedures performed   Clinical Data: No additional findings.  Objective: Vital Signs: Wt 73 lb 12.8 oz (33.5 kg)    BMI 15.04 kg/m   Physical Exam:   Constitutional: Patient appears thin HEENT:  Head: Normocephalic Eyes:EOM are normal Neck: Normal range of motion Cardiovascular: Normal rate Pulmonary/chest: Effort normal Neurologic: Patient is alert Skin: Skin is warm Psychiatric: Patient has normal mood and affect   Ortho Exam: Ortho exam demonstrates good cervical spine range of motion.  She has  a lidocaine patch over the right biceps region.  He PL FPL interosseous function intact with normal wrist flexion extension biceps triceps and deltoid strength bilaterally.  No coarse grinding or crepitus present.  Slight Popeye deformity noted on the right biceps compared to the left but it is very subtle.  No warmth or lymphadenopathy noted in the right shoulder girdle region.  Both knees are examined.  She has intact extensor mechanism.  Stable collateral cruciate ligaments with no effusion in either knee.  No focal joint line tenderness is present.  Pedal pulses palpable.  No groin pain with internal/external rotation of  the hips.  Specialty Comments:  No specialty comments available.  Imaging: No results found.   PMFS History: Patient Active Problem List   Diagnosis Date Noted   Loss of weight 05/18/2021   Protein-calorie malnutrition, severe ([place]) 05/18/2021   Osteoporosis 06/15/2020   Iron deficiency anemia 04/09/2020   Primary osteoarthritis involving multiple joints 12/31/2018   Rheumatoid arthritis involving multiple sites with positive rheumatoid factor ([place]) 12/31/2018   Irritable bowel syndrome with both constipation and diarrhea 10/28/2018   Constipation 03/14/2016   Mitral valve prolapse 11/23/2015   High cholesterol 11/23/2015   Past Medical History:  Diagnosis Date   Anemia    Anxiety    Arthritis    RA   Cancer (HCC)    Skin   CHF (congestive heart failure) (HCC)    Collagen vascular disease (HCC)    Depression    Endometriosis    High cholesterol    IBS (irritable bowel syndrome)    Lymphadenopathy    Mitral valve prolapse    RA (rheumatoid arthritis) (HCC)    Rotator cuff tear    bilateral   Uterus, adenomyosis     Family History  Problem Relation Age of Onset   Colon cancer Other    Bone cancer Mother    Brain cancer Father    Brain cancer Sister    Breast cancer Sister    Renal Disease Sister    Pancreatic cancer Neg Hx    Esophageal cancer Neg Hx    Stomach cancer Neg Hx    Rectal cancer Neg Hx     Past Surgical History:  Procedure Laterality Date   AXILLARY LYMPH NODE BIOPSY Right 10/12/2020   Procedure: RIGHT AXILLARY LYMPH NODE BIOPSY EXCISION;  Surgeon: Vanessa Klein, MD;  Location: [facility];  Service: General;  Laterality: Right;   BUNIONECTOMY Right    CARPAL TUNNEL RELEASE Right    COLONOSCOPY     COLONOSCOPY N/A 12/13/2015   Procedure: COLONOSCOPY;  Surgeon: Vanessa Houston, MD;  [facility];  Service: Endoscopy;  Laterality: N/A;  9:55   Fatty tumor     2011 (left arm)   FOOT SURGERY     [place]    TONSILLECTOMY     TOTAL ABDOMINAL HYSTERECTOMY     2003   Social History   Occupational History   Occupation: retired  Tobacco Use   Smoking status: Former    Packs/day: 0.50    Years: 33.00    Pack years: 16.50    Types: Cigarettes   Smokeless tobacco: Never   Tobacco comments:    quit 9 yrs ago  Vaping Use   Vaping Use: Never used  Substance and Sexual Activity   Alcohol use: No    Alcohol/week: 0.0 standard drinks   Drug use: No   Sexual activity: Not Currently

## 2021-11-07 ENCOUNTER — Telehealth: Payer: Self-pay | Admitting: Surgical

## 2021-11-07 ENCOUNTER — Other Ambulatory Visit: Payer: Self-pay | Admitting: Surgical

## 2021-11-07 NOTE — Telephone Encounter (Signed)
Received call from [place] with [facility] needing a Rx for the pin needles that go with the [place]. Larene Beach advised she did not receive Rx for the pin needles. Larene Beach asked to send Rx to the [facility] on [address] The number to contact Larene Beach is [phone]

## 2021-11-08 ENCOUNTER — Telehealth: Payer: Self-pay | Admitting: Surgical

## 2021-11-08 NOTE — Telephone Encounter (Signed)
Patient called. She would like a refill on predniSONE

## 2021-11-08 NOTE — Telephone Encounter (Signed)
No  that was a one-time order with Lurena Joiner

## 2021-11-09 ENCOUNTER — Ambulatory Visit: Payer: Medicare HMO | Admitting: Podiatry

## 2021-11-09 NOTE — Telephone Encounter (Signed)
LMOM for patient

## 2021-11-11 DIAGNOSIS — R5381 Other malaise: Secondary | ICD-10-CM | POA: Insufficient documentation

## 2021-11-17 ENCOUNTER — Telehealth: Payer: Self-pay | Admitting: Orthopedic Surgery

## 2021-11-17 NOTE — Telephone Encounter (Signed)
She had left knee monovisc injection by Artis Delay in December so it's up to her if she wants to repeat it, but we couldn't do it until early summer 2023.  Not sure it helped all that much since she had continued severe knee pain after that injection

## 2021-11-17 NOTE — Telephone Encounter (Signed)
Patient is requesting bilateral gel knee injections but she is not sure when her injection was. Her left knee is more painful than her right. Can you please f/u with patient to let her know she can receive the injections.

## 2021-11-18 NOTE — Telephone Encounter (Signed)
I called patient. No answer. LMVM advising per Runell Gess note.

## 2021-11-21 ENCOUNTER — Encounter (HOSPITAL_COMMUNITY): Payer: Self-pay

## 2021-11-21 ENCOUNTER — Inpatient Hospital Stay (HOSPITAL_COMMUNITY): Payer: Medicare HMO | Attending: Hematology

## 2021-11-21 ENCOUNTER — Other Ambulatory Visit: Payer: Self-pay

## 2021-11-21 VITALS — BP 110/68 | HR 93 | Temp 98.0°F | Resp 18 | Wt <= 1120 oz

## 2021-11-21 DIAGNOSIS — D509 Iron deficiency anemia, unspecified: Secondary | ICD-10-CM | POA: Insufficient documentation

## 2021-11-21 DIAGNOSIS — Z79899 Other long term (current) drug therapy: Secondary | ICD-10-CM | POA: Insufficient documentation

## 2021-11-21 MED ORDER — ACETAMINOPHEN 325 MG PO TABS: 650.0000 mg | ORAL_TABLET | Freq: Once | ORAL | Status: DC

## 2021-11-21 MED ORDER — SODIUM CHLORIDE 0.9 % IV SOLN: INTRAVENOUS | Status: DC

## 2021-11-21 MED ORDER — SODIUM CHLORIDE 0.9 % IV SOLN
200.0000 mg | Freq: Once | INTRAVENOUS | Status: AC
  Administered 2021-11-21: 200 mg via INTRAVENOUS
  Filled 2021-11-21: qty 200

## 2021-11-21 MED ORDER — SODIUM CHLORIDE 0.9 % IV SOLN: Freq: Once | INTRAVENOUS | Status: AC

## 2021-11-21 NOTE — Progress Notes (Signed)
Venofer 200 mg given today per MD orders. Tolerated infusion without adverse affects. Vital signs stable. No complaints at this time. Discharged from clinic by wheel chair in stable condition. Alert and oriented x 3. F/U with [facility] as scheduled.    Venofer 200 mg given today per MD orders. Tolerated infusion without adverse affects. Vital signs stable. No complaints at this time. Discharged from clinic by wheel chair in stable condition. Alert and oriented x 3. F/U with [facility] as scheduled.

## 2021-11-21 NOTE — Patient Instructions (Signed)
[facility]  Discharge Instructions: Thank you for choosing [facility] to provide your oncology and hematology care.  If you have a lab appointment with the [facility], please come in thru the Main Entrance and check in at the main information desk.  Wear comfortable clothing and clothing appropriate for easy access to any Portacath or PICC line.   We strive to give you quality time with your provider. You may need to reschedule your appointment if you arrive late (15 or more minutes).  Arriving late affects you and other patients whose appointments are after yours.  Also, if you miss three or more appointments without notifying the office, you may be dismissed from the clinic at the providers discretion.      For prescription refill requests, have your pharmacy contact our office and allow 72 hours for refills to be completed.    Today you received the following : Venofer 200 mg       To help prevent nausea and vomiting after your treatment, we encourage you to take your nausea medication as directed.  BELOW ARE SYMPTOMS THAT SHOULD BE REPORTED IMMEDIATELY: *FEVER GREATER THAN 100.4 F (38 C) OR HIGHER *CHILLS OR SWEATING *NAUSEA AND VOMITING THAT IS NOT CONTROLLED WITH YOUR NAUSEA MEDICATION *UNUSUAL SHORTNESS OF BREATH *UNUSUAL BRUISING OR BLEEDING *URINARY PROBLEMS (pain or burning when urinating, or frequent urination) *BOWEL PROBLEMS (unusual diarrhea, constipation, pain near the anus) TENDERNESS IN MOUTH AND THROAT WITH OR WITHOUT PRESENCE OF ULCERS (sore throat, sores in mouth, or a toothache) UNUSUAL RASH, SWELLING OR PAIN  UNUSUAL VAGINAL DISCHARGE OR ITCHING   Items with * indicate a potential emergency and should be followed up as soon as possible or go to the Emergency Department if any problems should occur.  Please show the CHEMOTHERAPY ALERT CARD or IMMUNOTHERAPY ALERT CARD at check-in to the Emergency Department and triage nurse.  Should  you have questions after your visit or need to cancel or reschedule your appointment, please contact [facility] [phone]  and follow the prompts.  Office hours are 8:00 a.m. to 4:30 p.m. Monday - Friday. Please note that voicemails left after 4:00 p.m. may not be returned until the following business day.  We are closed weekends and major holidays. You have access to a nurse at all times for urgent questions. Please call the main number to the clinic [phone] and follow the prompts.  For any non-urgent questions, you may also contact your provider using MyChart. We now offer e-Visits for anyone 65 and older to request care online for non-urgent symptoms. For details visit [email or web address].   Also download the MyChart app! Go to the app store, search "MyChart", open the app, select [facility], and log in with your MyChart username and password.  Due to Covid, a mask is required upon entering the hospital/clinic. If you do not have a mask, one will be given to you upon arrival. For doctor visits, patients may have 1 support person aged 68 or older with them. For treatment visits, patients cannot have anyone with them due to current Covid guidelines and our immunocompromised population.

## 2021-11-24 ENCOUNTER — Telehealth: Payer: Self-pay | Admitting: Orthopedic Surgery

## 2021-11-24 NOTE — Telephone Encounter (Signed)
Pt called requesting a order to be sent in for an xray of right shoulder for today.  She has an upcoming appt but she states she will go to [facility] or in that area. Please call pt about this matter at [phone].

## 2021-11-24 NOTE — Telephone Encounter (Signed)
Agree with gil. She saw Dr Marlou Sa for her shoulder pain recently and it can take several months for shoulder pain to improve following proximal bicep tendon rupture.  If she has a new injury she can be seen in clinic with new xrays but if it is the same pain she has been experiencing then I dont think new x-rays are indicated.

## 2021-11-24 NOTE — Telephone Encounter (Signed)
I talked to the pt. No new injuries. She says she is in extreme pain and says something is wrong with that right shoulder. Can you work her in tomorrow?

## 2021-11-24 NOTE — Telephone Encounter (Signed)
Please advise. Both of you guys have seen this pt for her shoulder. Vanessa Lara just had a MRI done on it. Not sure what to do from here.

## 2021-11-25 NOTE — Telephone Encounter (Signed)
Can you please call her at some point today to discuss this with her

## 2021-11-25 NOTE — Telephone Encounter (Signed)
I'd say she can be worked in next week with me or Dr Marlou Sa, I dont think I can work her in to my schedule today unfortunately

## 2021-11-25 NOTE — Telephone Encounter (Signed)
Called her. Can we set her up for appt Wednesday at 8:15 AM? Need c-spine xr's

## 2021-11-28 ENCOUNTER — Other Ambulatory Visit: Payer: Self-pay

## 2021-11-28 ENCOUNTER — Inpatient Hospital Stay (HOSPITAL_COMMUNITY): Payer: Medicare HMO

## 2021-11-28 VITALS — BP 104/59 | HR 85 | Temp 99.0°F | Resp 18 | Wt 71.0 lb

## 2021-11-28 DIAGNOSIS — D509 Iron deficiency anemia, unspecified: Secondary | ICD-10-CM | POA: Diagnosis not present

## 2021-11-28 MED ORDER — SODIUM CHLORIDE 0.9 % IV SOLN: Freq: Once | INTRAVENOUS | Status: AC

## 2021-11-28 MED ORDER — SODIUM CHLORIDE 0.9 % IV SOLN
200.0000 mg | Freq: Once | INTRAVENOUS | Status: AC
  Administered 2021-11-28: 200 mg via INTRAVENOUS
  Filled 2021-11-28: qty 200

## 2021-11-28 NOTE — Progress Notes (Signed)
Patient presents today for iron infusion.  Patient is in satisfactory condition with no new complaints voiced.  Vital signs are stable.  We will proceed with treatment per MD orders.  ? ?Patient tolerated treatment well with no complaints voiced.  Patient left via wheelchair in stable condition.  Vital signs stable at discharge.  Follow up as scheduled.    ?

## 2021-11-28 NOTE — Patient Instructions (Signed)
[facility]  Discharge Instructions: Thank you for choosing [facility] to provide your oncology and hematology care.  If you have a lab appointment with the Cancer Center, please come in thru the Main Entrance and check in at the main information desk.  Wear comfortable clothing and clothing appropriate for easy access to any Portacath or PICC line.   We strive to give you quality time with your provider. You may need to reschedule your appointment if you arrive late (15 or more minutes).  Arriving late affects you and other patients whose appointments are after yours.  Also, if you miss three or more appointments without notifying the office, you may be dismissed from the clinic at the provider's discretion.      For prescription refill requests, have your pharmacy contact our office and allow 72 hours for refills to be completed.        To help prevent nausea and vomiting after your treatment, we encourage you to take your nausea medication as directed.  BELOW ARE SYMPTOMS THAT SHOULD BE REPORTED IMMEDIATELY: *FEVER GREATER THAN 100.4 F (38 C) OR HIGHER *CHILLS OR SWEATING *NAUSEA AND VOMITING THAT IS NOT CONTROLLED WITH YOUR NAUSEA MEDICATION *UNUSUAL SHORTNESS OF BREATH *UNUSUAL BRUISING OR BLEEDING *URINARY PROBLEMS (pain or burning when urinating, or frequent urination) *BOWEL PROBLEMS (unusual diarrhea, constipation, pain near the anus) TENDERNESS IN MOUTH AND THROAT WITH OR WITHOUT PRESENCE OF ULCERS (sore throat, sores in mouth, or a toothache) UNUSUAL RASH, SWELLING OR PAIN  UNUSUAL VAGINAL DISCHARGE OR ITCHING   Items with * indicate a potential emergency and should be followed up as soon as possible or go to the Emergency Department if any problems should occur.  Please show the CHEMOTHERAPY ALERT CARD or IMMUNOTHERAPY ALERT CARD at check-in to the Emergency Department and triage nurse.  Should you have questions after your visit or need to cancel  or reschedule your appointment, please contact [facility] [phone]  and follow the prompts.  Office hours are 8:00 a.m. to 4:30 p.m. Monday - Friday. Please note that voicemails left after 4:00 p.m. may not be returned until the following business day.  We are closed weekends and major holidays. You have access to a nurse at all times for urgent questions. Please call the main number to the clinic [phone] and follow the prompts.  For any non-urgent questions, you may also contact your provider using MyChart. We now offer e-Visits for anyone 18 and older to request care online for non-urgent symptoms. For details visit [email or web address].   Also download the MyChart app! Go to the app store, search "MyChart", open the app, select , and log in with your MyChart username and password.  Due to Covid, a mask is required upon entering the hospital/clinic. If you do not have a mask, one will be given to you upon arrival. For doctor visits, patients may have 1 support person aged 18 or older with them. For treatment visits, patients cannot have anyone with them due to current Covid guidelines and our immunocompromised population.  

## 2021-11-28 NOTE — Telephone Encounter (Signed)
Added to Dr. Forbes Cellar schedule

## 2021-11-30 ENCOUNTER — Other Ambulatory Visit: Payer: Self-pay

## 2021-11-30 ENCOUNTER — Telehealth: Payer: Self-pay | Admitting: Orthopedic Surgery

## 2021-11-30 ENCOUNTER — Ambulatory Visit (INDEPENDENT_AMBULATORY_CARE_PROVIDER_SITE_OTHER): Payer: Medicare HMO

## 2021-11-30 ENCOUNTER — Encounter: Payer: Self-pay | Admitting: Orthopedic Surgery

## 2021-11-30 ENCOUNTER — Ambulatory Visit (INDEPENDENT_AMBULATORY_CARE_PROVIDER_SITE_OTHER): Payer: Medicare HMO | Admitting: Surgical

## 2021-11-30 VITALS — Wt 70.2 lb

## 2021-11-30 DIAGNOSIS — M25511 Pain in right shoulder: Secondary | ICD-10-CM

## 2021-11-30 DIAGNOSIS — G8929 Other chronic pain: Secondary | ICD-10-CM | POA: Diagnosis not present

## 2021-11-30 DIAGNOSIS — R413 Other amnesia: Secondary | ICD-10-CM | POA: Diagnosis not present

## 2021-11-30 DIAGNOSIS — M542 Cervicalgia: Secondary | ICD-10-CM | POA: Diagnosis not present

## 2021-11-30 NOTE — Progress Notes (Signed)
? ?Office Visit Note ?  ?Patient: Vanessa Lara           ?Date of Birth: [birth year]           ?MRN: [id number] ?Visit Date: 11/30/2021 ?Requested by: Pablo Lawrence, NP ?[place],  [place] [id number] ?PCP: Pablo Lawrence, NP ? ?Subjective: ?No chief complaint on file. ? ? ?HPI: Vanessa Lara is a 66 y.o. female who presents to the office complaining of multiple joint complaints.  Today, she complains of chronic right shoulder pain that she feels has been worse in the last 1.5 weeks.  She has history of partial-thickness rotator cuff damage as well as a more recent rupture of the proximal bicep tendon that was diagnosed by MRI scan.  She feels her bicep pain is improving but she does note worsening right shoulder pain in the superior lateral aspect of the shoulder.  She feels this stems from her neck and the neck radiates pain into her thoracic spine and between her scapula.  She has pain that radiates into her scapula and down the arm to some extent as well.  This pain has been ongoing for her in the past but has been specifically worse in the last 1.5 weeks.  She does have a history of a herniated disc in her cervical spine that she had nonoperative treatment for in the 1990s.  She has no history of prior cervical spine surgery.  She is using a soft collar with little relief.  She states she has increased pain in her shoulder and her neck with rotating her neck and flexing to look down.  She has tried to do a home exercise program but has severe fatigue due to her multiple medical comorbidities and feels that any neck exercises she tries has made her symptoms worse.  She is still receiving iron infusions.  She is scheduled to see a new rheumatologist, Dr. Benjamine Mola, on 12/05/2021 and is eager to get her rheumatoid arthritis sorted out though she is wary of biologic medications. ? ?Additionally, she complains of severe headaches that have really only been persisting for the last 4 days where she  will have severe pain in the left part of her forehead that lasts for about 5 seconds before resolving by themselves.  She also has some pain in the occipital aspect of the skull which she feels may be related to her neck.  A close friend of hers was with her in the clinic today and endorsed that she has been more forgetful in the last several weeks and actually forgot that he stop by a couple days ago.  Rossie has a family history of multiple family members including her father and sister dying from brain tumors..   ?             ?ROS: All systems reviewed are negative as they relate to the chief complaint within the history of present illness.  Patient denies fevers or chills. ? ?Assessment & Plan: ?Visit Diagnoses:  ?1. Cervicalgia   ?2. Chronic right shoulder pain   ?3. Memory loss   ? ? ?Plan: Patient is a 66 year old female who presents for evaluation of multiple joint complaints.  Primarily complaining of right shoulder pain/neck pain as well as new onset headaches.  The bicep related pain she has had in the past is actually improving but she has worsened neck, scapula, superior lateral shoulder pain.  Increased pain in these regions with cervical spine  range of motion.  She does have slight weakness of multiple muscle groups on exam today which may be related to her overall fatigue.  Cannot really do home exercises or physical therapy due to her fatigue as well.  She does have history of prior herniated disc in the past but this was several decades ago.  Radiograph taken today do demonstrate moderate degenerative changes primarily localized to C5-C6 with moderate disc space narrowing and osteophyte formation with loss of lordosis as well.  With patient's symptoms in the neck and possible referred pain to the shoulder, as well as her inability to perform exercises due to her fatigue, plan for further evaluation with MRI of the cervical spine. ? ?Additionally, she has severe headaches that have been present for  several days.  These do not last very long but are very concerning for her with her family history of her father and sister dying from brain tumors.  She has also been more forgetful over the last several weeks with her partner noticing this as well.  Plan is referral to [facility] neurology for further evaluation.  ? ?Follow-Up Instructions: No follow-ups on file.  ? ?Orders:  ?Orders Placed This Encounter  ?Procedures  ? XR Cervical Spine 2 or 3 views  ? XR Shoulder Right  ? MR Cervical Spine w/o contrast  ? Ambulatory referral to Neurology  ? ?No orders of the defined types were placed in this encounter. ? ? ? ? Procedures: ?No procedures performed ? ? ?Clinical Data: ?No additional findings. ? ?Objective: ?Vital Signs: Wt 70 lb 3.2 oz (31.8 kg)   BMI 14.30 kg/m?  ? ?Physical Exam:  ?Constitutional: Patient appears well-developed ?HEENT:  ?Head: Normocephalic ?Eyes:EOM are normal ?Neck: Normal range of motion ?Cardiovascular: Normal rate ?Pulmonary/chest: Effort normal ?Neurologic: Patient is alert ?Skin: Skin is warm ?Psychiatric: Patient has normal mood and affect ? ?Ortho Exam: Ortho exam demonstrates right shoulder with 90 degrees external rotation, 90 degrees abduction, 170 degrees forward flexion.  She has well-preserved strength of supraspinatus and infraspinatus and subscapularis rated 5/5.  She has tenderness throughout the axial cervical spine.  Increased pain with cervical spine range of motion, particularly with looking to each side.  Also has increased pain with looking down.  She has 5 -/5 strength of grip strength, finger abduction, pronation/supination, bicep, tricep. ? ?Specialty Comments:  ?No specialty comments available. ? ?Imaging: ?No results found. ? ? ?PMFS History: ?Patient Active Problem List  ? Diagnosis Date Noted  ? Loss of weight 05/18/2021  ? Protein-calorie malnutrition, severe ([place]) 05/18/2021  ? Osteoporosis 06/15/2020  ? Iron deficiency anemia 04/09/2020  ? Primary osteoarthritis  involving multiple joints 12/31/2018  ? Rheumatoid arthritis involving multiple sites with positive rheumatoid factor ([place]) 12/31/2018  ? Irritable bowel syndrome with both constipation and diarrhea 10/28/2018  ? Constipation 03/14/2016  ? Mitral valve prolapse 11/23/2015  ? High cholesterol 11/23/2015  ? ?Past Medical History:  ?Diagnosis Date  ? Anemia   ? Anxiety   ? Arthritis   ? RA  ? Cancer [facility])   ? Skin  ? CHF (congestive heart failure) ([place])   ? Collagen vascular disease ([place])   ? Depression   ? Endometriosis   ? High cholesterol   ? IBS (irritable bowel syndrome)   ? Lymphadenopathy   ? Mitral valve prolapse   ? RA (rheumatoid arthritis) ([place])   ? Rotator cuff tear   ? bilateral  ? Uterus, adenomyosis   ?  ?Family History  ?Problem  Relation Age of Onset  ? Colon cancer Other   ? Bone cancer Mother   ? Brain cancer Father   ? Brain cancer Sister   ? Breast cancer Sister   ? Renal Disease Sister   ? Pancreatic cancer Neg Hx   ? Esophageal cancer Neg Hx   ? Stomach cancer Neg Hx   ? Rectal cancer Neg Hx   ?  ?Past Surgical History:  ?Procedure Laterality Date  ? AXILLARY LYMPH NODE BIOPSY Right 10/12/2020  ? Procedure: RIGHT AXILLARY LYMPH NODE BIOPSY EXCISION;  Surgeon: Stark Klein, MD;  Location: [facility];  Service: General;  Laterality: Right;  ? BUNIONECTOMY Right   ? CARPAL TUNNEL RELEASE Right   ? COLONOSCOPY    ? COLONOSCOPY N/A 12/13/2015  ? Procedure: COLONOSCOPY;  Surgeon: Rogene Houston, MD;  [facility];  Service: Endoscopy;  Laterality: N/A;  9:55  ? Fatty tumor    ? 2011 (left arm)  ? FOOT SURGERY    ? hammertoe  ? TONSILLECTOMY    ? TOTAL ABDOMINAL HYSTERECTOMY    ? 2003  ? ?Social History  ? ?Occupational History  ? Occupation: retired  ?Tobacco Use  ? Smoking status: Former  ?  Packs/day: 0.50  ?  Years: 33.00  ?  Pack years: 16.50  ?  Types: Cigarettes  ? Smokeless tobacco: Never  ? Tobacco comments:  ?  quit 9 yrs ago  ?Vaping Use  ? Vaping Use: Never used   ?Substance and Sexual Activity  ? Alcohol use: No  ?  Alcohol/week: 0.0 standard drinks  ? Drug use: No  ? Sexual activity: Not Currently  ? ? ? ? ?  ?

## 2021-11-30 NOTE — Telephone Encounter (Signed)
[facility] called asking for prior auth for FORTEO  ? ?CB 1- [phone] ?

## 2021-12-01 ENCOUNTER — Ambulatory Visit: Payer: Medicare HMO | Admitting: Orthopaedic Surgery

## 2021-12-01 ENCOUNTER — Telehealth: Payer: Self-pay

## 2021-12-01 NOTE — Telephone Encounter (Signed)
IC advised Dr Dean/Luke will not be providing Forteo ?

## 2021-12-01 NOTE — Telephone Encounter (Signed)
Patient called into the office stating that she would like her Mri moved to [facility] or  instead of [facility].  ? ?She also stated that the pain in her forehead is much worse today and that she would like to know if Lurena Joiner can order some diagnostic testing   ?

## 2021-12-01 NOTE — Telephone Encounter (Signed)
Vanessa Lara, can you change order for MRI? ? ?Lurena Joiner I advised what we discussed about testing for her head-she said she needs to know what is going on with her brain-tried to explain that was the reason for neurology referral etc-patient stated this was not acceptable that anyone could order a study and read a report-she is requesting for you to call her Friday at some point as she wishes to discuss this with you ?

## 2021-12-02 NOTE — Telephone Encounter (Signed)
Sw pt and I informed her that I will have to change the facility and date with [employer] her insurance once she is scheduled with [facility]. Once they call her and get her scheduled I can go into [employer] to change ?

## 2021-12-04 NOTE — Progress Notes (Signed)
Office Visit Note  Patient: Vanessa Lara             Date of Birth: January 19, 1956           MRN: [id number]             PCP: Pablo Lawrence, NP Referring: Donella Stade, PA-C Visit Date: 12/05/2021  Subjective:  New Patient (Initial Visit) (Abnormal labs)   History of Present Illness: Vanessa Lara is a 66 y.o. female here for evaluation of rheumatoid arthritis with chronic joint pain in multiple areas and chronic fatigue labs showing elevated rheumatoid factor. She has history of mitral valve prolapse, severe osteoporosis, osteoarthritis, and severe weight loss and deconditioning. She was diagnosed with seropositive RA in 2018 with Dr. Kathlene November and most recently saw Dr. Amil Amen in 11/2020. At that time recommended to start hydroxychloroquine for inflammatory arthritis symptoms. She previously tried methotrexate, simponi aria, and orencia with poor tolerance. Imaging consistent with degenerative arthritis in cervical spine and knee but also with some partial tendon tear in shoulder and some synovitis present in knee on MRI imaging. She has taken multiple medications for pain including NSAIDs, prednisone, gabapentin, tramadol, and hydrocodone or oxycodone. She has a lot of pain in multiple areas but is also severely fatigued. She has significant weight loss down to 70 lbs unintentionally reports chronic IBS issues but no recent changes during this period of weight loss. Joint pain also limits her activity quite a bit with chronic deformities and also ongoing swelling in bilateral hands. She has also developed left sided headache localized above the temporal area. She has a lot of pain at the base of the skull and occiput without clear radiation of symptoms. She is scheduled to see Dr. Billey Gosling for headache evaluation and management 3/28.  10/2020 ESR 62  05/2017 RF 15 CCP 32 HBV neg HCV neg  Activities of Daily Living:  Patient reports morning stiffness for 24 hours.   Patient Reports  nocturnal pain.  Difficulty dressing/grooming: Reports Difficulty climbing stairs: Denies Difficulty getting out of chair: Denies Difficulty using hands for taps, buttons, cutlery, and/or writing: Reports  Review of Systems  Constitutional:  Positive for fatigue.  HENT:  Positive for mouth dryness.   Eyes:  Positive for dryness.  Respiratory:  Negative for shortness of breath.   Cardiovascular:  Negative for swelling in legs/feet.  Gastrointestinal:  Positive for constipation and diarrhea.  Endocrine: Positive for cold intolerance and heat intolerance.  Genitourinary:  Negative for difficulty urinating.  Musculoskeletal:  Positive for joint pain, gait problem, joint pain, joint swelling, muscle weakness, morning stiffness and muscle tenderness.  Skin:  Negative for rash.  Allergic/Immunologic: Negative for susceptible to infections.  Neurological:  Positive for numbness and weakness.  Hematological:  Negative for bruising/bleeding tendency.  Psychiatric/Behavioral:  Negative for sleep disturbance.    PMFS History:  Patient Active Problem List   Diagnosis Date Noted   Medication monitoring encounter 12/05/2021   Physical deconditioning 11/11/2021   Elevated serum lactate dehydrogenase (LDH) 08/17/2021   Vitamin D deficiency 08/17/2021   Chronic fatigue 07/25/2021   Vitamin B12 deficiency 07/19/2021   Hardening of the aorta (main artery of the heart) ([place]) 06/21/2021   Solitary lung nodule 06/15/2021   Loss of weight 05/18/2021   Protein-calorie malnutrition, severe ([place]) 05/18/2021   Osteoporosis 06/15/2020   Iron deficiency anemia 04/09/2020   Primary osteoarthritis involving multiple joints 12/31/2018   Rheumatoid arthritis involving multiple sites with positive rheumatoid factor ([place]) 12/31/2018  Irritable bowel syndrome with both constipation and diarrhea 10/28/2018   Constipation 03/14/2016   Mitral valve prolapse 11/23/2015   High cholesterol 11/23/2015    Past  Medical History:  Diagnosis Date   Anemia    Anxiety    Arthritis    RA   Cancer (HCC)    Skin   CHF (congestive heart failure) (HCC)    Collagen vascular disease (HCC)    Depression    Endometriosis    High cholesterol    IBS (irritable bowel syndrome)    Lymphadenopathy    Mitral valve prolapse    RA (rheumatoid arthritis) (H[facility])    Rotator cuff tear    bilateral   Uterus, adenomyosis     Family History  Problem Relation Age of Onset   Colon cancer Other    Bone cancer Mother    Brain cancer Father    Brain cancer Sister    Breast cancer Sister    Renal Disease Sister    Pancreatic cancer Neg Hx    Esophageal cancer Neg Hx    Stomach cancer Neg Hx    Rectal cancer Neg Hx    Past Surgical History:  Procedure Laterality Date   AXILLARY LYMPH NODE BIOPSY Right 10/12/2020   Procedure: RIGHT AXILLARY LYMPH NODE BIOPSY EXCISION;  Surgeon: Stark Klein, MD;  Location: [facility];  Service: General;  Laterality: Right;   BUNIONECTOMY Right    CARPAL TUNNEL RELEASE Right    COLONOSCOPY     COLONOSCOPY N/A 12/13/2015   Procedure: COLONOSCOPY;  Surgeon: Rogene Houston, MD;  [facility];  Service: Endoscopy;  Laterality: N/A;  9:55   Fatty tumor     2011 (left arm)   FOOT SURGERY     hammertoe   TONSILLECTOMY     TOTAL ABDOMINAL HYSTERECTOMY     2003   Social History   Social History Narrative   Not on file   Immunization History  Administered Date(s) Administered   Influenza, Quadrivalent, Recombinant, Inj, Pf 07/11/2017     Objective: Vital Signs: BP (!) 78/54 (BP Location: Right Arm, Patient Position: Sitting, Cuff Size: Normal)    Pulse (!) 103    Resp 17    Ht 4' 11"  (1.499 m)    Wt 70 lb (31.8 kg)    BMI 14.14 kg/m    Physical Exam Constitutional:      Comments: Underweight and chronically ill appearing  Cardiovascular:     Rate and Rhythm: Regular rhythm. Tachycardia present.  Pulmonary:     Effort: Pulmonary effort is  normal.     Breath sounds: Normal breath sounds.  Musculoskeletal:     Right lower leg: No edema.     Left lower leg: No edema.  Lymphadenopathy:     Cervical: No cervical adenopathy.  Skin:    General: Skin is warm and dry.     Findings: No rash.  Neurological:     Mental Status: She is alert.     Comments: Somewhat generalized weakness  Psychiatric:        Mood and Affect: Mood normal.     Musculoskeletal Exam:  Neck tenderness of paraspinal muscles near base of skull bilaterally Right shoulder decreased abduction ROM, tenderness Elbows full ROM no tenderness or swelling Right wrist extension ROM decreased with tenderness to pressure Multiple chronic deformities with lateral deviation and some fingers in swan neck deformity, swelling in left 2nd-3rd MCPs, swelling with tenderness in right 2nd-3rd MCPs  and 2nd PIP Knees full ROM no tenderness or swelling crepitus present Ankles full ROM no tenderness or swelling MTPs bilateral 1st digit bunions, cocked up toe deformities in 2nd-4th toes, right 5th MTP internally deviated   CDAI Exam: CDAI Score: 22  Patient Global: 70 mm; Provider Global: 50 mm Swollen: 5 ; Tender: 5  Joint Exam 12/05/2021      Right  Left  Glenohumeral   Tender     Wrist   Tender     MCP 2  Swollen Tender  Swollen   MCP 3  Swollen Tender  Swollen   PIP 2  Swollen Tender        Investigation: No additional findings.  Imaging: XR Cervical Spine 2 or 3 views  Result Date: 11/30/2021 AP and lateral views of cervical spine reviewed.  No fracture or spondylolisthesis noted.  There is loss of lordosis with degenerative changes at multiple levels, primarily at C5-C6 with moderate disc space narrowing and osteophyte formation.  XR Shoulder Right  Result Date: 11/30/2021 AP, scapular Y, axillary views of right shoulder reviewed.  No fracture or dislocation noted.  There is small enthesophyte noted off the greater tuberosity.  No significant loss of  acromiohumeral interval.  No degenerative changes of the glenohumeral joint.   Recent Labs: Lab Results  Component Value Date   WBC 17.7 (H) 12/05/2021   HGB 11.0 (L) 12/05/2021   PLT 545 (H) 12/05/2021   NA 136 12/05/2021   K 4.3 12/05/2021   CL 102 12/05/2021   CO2 23 12/05/2021   GLUCOSE 93 12/05/2021   BUN 14 12/05/2021   CREATININE 0.67 12/05/2021   BILITOT 0.2 12/05/2021   ALKPHOS 148 (H) 08/23/2020   AST 11 12/05/2021   ALT 5 (L) 12/05/2021   PROT 7.1 12/05/2021   ALBUMIN 3.6 08/23/2020   CALCIUM 9.4 12/05/2021   GFRAA 92 11/06/2019   QFTBGOLDPLUS NEGATIVE 12/05/2021    Speciality Comments: No specialty comments available.  Procedures:  No procedures performed Allergies: Abatacept, Codeine, Golimumab, Hydrocodone, Methylprednisolone, Methotrexate derivatives, and Prednisone   Assessment / Plan:     Visit Diagnoses: Rheumatoid arthritis involving multiple sites with positive rheumatoid factor ([place]) - Plan: Sedimentation rate, C-reactive protein  Appears to have active seropositive RA with elevated ESR and multiple swollen joints. Already did not tolerate multiple biologic medications. Rechecking ESR and CRP today for disease activity assessment. History of intolerance to prednisone but taking 5 mg without much trouble. Her risk of side effects such as infection is probably increased due to overall condition, but I believe she would benefit with treatment of RA. Tocilizumab would be a novel mechanism of action for her and can decrease the excess cardiovascular risk associated with RA activity.  Chronic fatigue Irritable bowel syndrome with both constipation and diarrhea  Constitutional symptoms and weight loss not clear from exact mechanism. No focal symptoms or findings suggesting malignancy recorded. RA disease activity would not typically account for this degree of underweight but can cause significant fatigue.  Age-related osteoporosis without current pathological  fracture  On forteo treatment for osteoporosis, alendronate less favorable with her ongoing GI issues.  Primary osteoarthritis involving multiple joints  Ongoing followup with orthopedics for specific management several medications for pain currently NSAIDs.  Medication monitoring encounter - Plan: CBC with Differential/Platelet, COMPLETE METABOLIC PANEL WITH GFR, QuantiFERON-TB Gold Plus  We reviewed major side effect risks including injection site reactions, infection risk, cytopenias or liver toxicity. She has no history of diverticulitis or blood  clots. Checking CBC, CMP, and quantiferon baseline labs for tocilizumab medication monitoring.  Orders: Orders Placed This Encounter  Procedures   Sedimentation rate   C-reactive protein   CBC with Differential/Platelet   COMPLETE METABOLIC PANEL WITH GFR   QuantiFERON-TB Gold Plus   No orders of the defined types were placed in this encounter.    Follow-Up Instructions: Return in about 2 months (around 02/04/2022) for New pt RA ?TOC start f/u 63mo.   CCollier Salina MD  Note - This record has been created using [employer]  Chart creation errors have been sought, but may not always  have been located. Such creation errors do not reflect on  the standard of medical care.

## 2021-12-05 ENCOUNTER — Ambulatory Visit (INDEPENDENT_AMBULATORY_CARE_PROVIDER_SITE_OTHER): Payer: Medicare HMO | Admitting: Internal Medicine

## 2021-12-05 ENCOUNTER — Encounter: Payer: Self-pay | Admitting: Internal Medicine

## 2021-12-05 ENCOUNTER — Other Ambulatory Visit: Payer: Self-pay

## 2021-12-05 VITALS — BP 78/54 | HR 103 | Resp 17 | Ht 59.0 in | Wt <= 1120 oz

## 2021-12-05 DIAGNOSIS — M0579 Rheumatoid arthritis with rheumatoid factor of multiple sites without organ or systems involvement: Secondary | ICD-10-CM | POA: Diagnosis not present

## 2021-12-05 DIAGNOSIS — R5382 Chronic fatigue, unspecified: Secondary | ICD-10-CM

## 2021-12-05 DIAGNOSIS — M159 Polyosteoarthritis, unspecified: Secondary | ICD-10-CM

## 2021-12-05 DIAGNOSIS — K582 Mixed irritable bowel syndrome: Secondary | ICD-10-CM

## 2021-12-05 DIAGNOSIS — Z5181 Encounter for therapeutic drug level monitoring: Secondary | ICD-10-CM

## 2021-12-05 DIAGNOSIS — M810 Age-related osteoporosis without current pathological fracture: Secondary | ICD-10-CM

## 2021-12-05 DIAGNOSIS — M150 Primary generalized (osteo)arthritis: Secondary | ICD-10-CM

## 2021-12-05 NOTE — Patient Instructions (Signed)
I am checking several lab tests for RA activity and for medication monitoring. I believe you would be a candidate for an injectable RA medication called Actemra (tocilizumab) this works differently than the infusions tried before. ? ?We can reach out after results if everything looks good to go ahead with trying to start this treatment. ?

## 2021-12-06 ENCOUNTER — Inpatient Hospital Stay (HOSPITAL_COMMUNITY): Payer: Medicare HMO | Attending: Hematology

## 2021-12-06 VITALS — BP 111/67 | HR 67 | Temp 96.8°F | Resp 17 | Ht 59.0 in

## 2021-12-06 DIAGNOSIS — D509 Iron deficiency anemia, unspecified: Secondary | ICD-10-CM | POA: Diagnosis present

## 2021-12-06 MED ORDER — SODIUM CHLORIDE 0.9 % IV SOLN: Freq: Once | INTRAVENOUS | Status: AC

## 2021-12-06 MED ORDER — SODIUM CHLORIDE 0.9 % IV SOLN
200.0000 mg | Freq: Once | INTRAVENOUS | Status: AC
  Administered 2021-12-06: 200 mg via INTRAVENOUS
  Filled 2021-12-06: qty 200

## 2021-12-06 NOTE — Patient Instructions (Signed)
[facility]  Discharge Instructions: Thank you for choosing [facility] to provide your oncology and hematology care.  If you have a lab appointment with the Cancer Center, please come in thru the Main Entrance and check in at the main information desk.  Wear comfortable clothing and clothing appropriate for easy access to any Portacath or PICC line.   We strive to give you quality time with your provider. You may need to reschedule your appointment if you arrive late (15 or more minutes).  Arriving late affects you and other patients whose appointments are after yours.  Also, if you miss three or more appointments without notifying the office, you may be dismissed from the clinic at the provider's discretion.      For prescription refill requests, have your pharmacy contact our office and allow 72 hours for refills to be completed.    Today you received the following chemotherapy and/or immunotherapy agents Venofer      To help prevent nausea and vomiting after your treatment, we encourage you to take your nausea medication as directed.  BELOW ARE SYMPTOMS THAT SHOULD BE REPORTED IMMEDIATELY: *FEVER GREATER THAN 100.4 F (38 C) OR HIGHER *CHILLS OR SWEATING *NAUSEA AND VOMITING THAT IS NOT CONTROLLED WITH YOUR NAUSEA MEDICATION *UNUSUAL SHORTNESS OF BREATH *UNUSUAL BRUISING OR BLEEDING *URINARY PROBLEMS (pain or burning when urinating, or frequent urination) *BOWEL PROBLEMS (unusual diarrhea, constipation, pain near the anus) TENDERNESS IN MOUTH AND THROAT WITH OR WITHOUT PRESENCE OF ULCERS (sore throat, sores in mouth, or a toothache) UNUSUAL RASH, SWELLING OR PAIN  UNUSUAL VAGINAL DISCHARGE OR ITCHING   Items with * indicate a potential emergency and should be followed up as soon as possible or go to the Emergency Department if any problems should occur.  Please show the CHEMOTHERAPY ALERT CARD or IMMUNOTHERAPY ALERT CARD at check-in to the Emergency  Department and triage nurse.  Should you have questions after your visit or need to cancel or reschedule your appointment, please contact [facility] [phone]  and follow the prompts.  Office hours are 8:00 a.m. to 4:30 p.m. Monday - Friday. Please note that voicemails left after 4:00 p.m. may not be returned until the following business day.  We are closed weekends and major holidays. You have access to a nurse at all times for urgent questions. Please call the main number to the clinic [phone] and follow the prompts.  For any non-urgent questions, you may also contact your provider using MyChart. We now offer e-Visits for anyone 18 and older to request care online for non-urgent symptoms. For details visit [email or web address].   Also download the MyChart app! Go to the app store, search "MyChart", open the app, select [facility], and log in with your MyChart username and password.  Due to Covid, a mask is required upon entering the hospital/clinic. If you do not have a mask, one will be given to you upon arrival. For doctor visits, patients may have 1 support person aged 18 or older with them. For treatment visits, patients cannot have anyone with them due to current Covid guidelines and our immunocompromised population.  

## 2021-12-06 NOTE — Progress Notes (Signed)
Patient presents today for Venofer infusion per providers order.  Vital sings WNL.  Patient has no new complaints at this time.   ? ?Peripheral IV started and blood return noted pre and post infusion. ? ?Venofer infusion given today per MD orders.  Stable during infusion without adverse affects.  Vital signs stable.  No complaints at this time.  Discharge from clinic ambulatory in stable condition.  Alert and oriented X 3.  Follow up with [facility] as scheduled.  ?

## 2021-12-09 ENCOUNTER — Telehealth: Payer: Self-pay

## 2021-12-09 ENCOUNTER — Other Ambulatory Visit (HOSPITAL_COMMUNITY): Payer: Self-pay | Admitting: Family Medicine

## 2021-12-09 ENCOUNTER — Other Ambulatory Visit: Payer: Self-pay | Admitting: Family Medicine

## 2021-12-09 DIAGNOSIS — G4453 Primary thunderclap headache: Secondary | ICD-10-CM

## 2021-12-09 LAB — CBC WITH DIFFERENTIAL/PLATELET
Absolute Monocytes: 726 cells/uL (ref 200–950)
Basophils Absolute: 53 cells/uL (ref 0–200)
Basophils Relative: 0.3 %
Eosinophils Absolute: 18 cells/uL (ref 15–500)
Eosinophils Relative: 0.1 %
HCT: 34.1 % — ABNORMAL LOW (ref 35.0–45.0)
Hemoglobin: 11 g/dL — ABNORMAL LOW (ref 11.7–15.5)
Lymphs Abs: 1221 cells/uL (ref 850–3900)
MCH: 28.4 pg (ref 27.0–33.0)
MCHC: 32.3 g/dL (ref 32.0–36.0)
MCV: 87.9 fL (ref 80.0–100.0)
MPV: 9.7 fL (ref 7.5–12.5)
Monocytes Relative: 4.1 %
Neutro Abs: 15682 cells/uL — ABNORMAL HIGH (ref 1500–7800)
Neutrophils Relative %: 88.6 %
Platelets: 545 10*3/uL — ABNORMAL HIGH (ref 140–400)
RBC: 3.88 10*6/uL (ref 3.80–5.10)
RDW: 15.2 % — ABNORMAL HIGH (ref 11.0–15.0)
Total Lymphocyte: 6.9 %
WBC: 17.7 10*3/uL — ABNORMAL HIGH (ref 3.8–10.8)

## 2021-12-09 LAB — COMPLETE METABOLIC PANEL WITH GFR
AG Ratio: 1.2 (calc) (ref 1.0–2.5)
ALT: 5 U/L — ABNORMAL LOW (ref 6–29)
AST: 11 U/L (ref 10–35)
Albumin: 3.9 g/dL (ref 3.6–5.1)
Alkaline phosphatase (APISO): 80 U/L (ref 37–153)
BUN: 14 mg/dL (ref 7–25)
CO2: 23 mmol/L (ref 20–32)
Calcium: 9.4 mg/dL (ref 8.6–10.4)
Chloride: 102 mmol/L (ref 98–110)
Creat: 0.67 mg/dL (ref 0.50–1.05)
Globulin: 3.2 g/dL (calc) (ref 1.9–3.7)
Glucose, Bld: 93 mg/dL (ref 65–99)
Potassium: 4.3 mmol/L (ref 3.5–5.3)
Sodium: 136 mmol/L (ref 135–146)
Total Bilirubin: 0.2 mg/dL (ref 0.2–1.2)
Total Protein: 7.1 g/dL (ref 6.1–8.1)
eGFR: 97 mL/min/{1.73_m2} (ref >=60)

## 2021-12-09 LAB — C-REACTIVE PROTEIN: CRP: 32.9 mg/L — ABNORMAL HIGH (ref <8.0)

## 2021-12-09 LAB — QUANTIFERON-TB GOLD PLUS
Mitogen-NIL: 2.98 IU/mL
NIL: 0.03 IU/mL
QuantiFERON-TB Gold Plus: NEGATIVE
TB1-NIL: 0 IU/mL
TB2-NIL: 0 IU/mL

## 2021-12-09 LAB — SEDIMENTATION RATE: Sed Rate: 63 mm/h — ABNORMAL HIGH (ref 0–30)

## 2021-12-09 NOTE — Telephone Encounter (Signed)
Vanessa Lara called stating she had an appointment on 12/05/21 and Dr. Benjamine Mola was going to review her labs before deciding which medication to start her on.  Vanessa Lara requested a return call.   ?

## 2021-12-11 NOTE — Progress Notes (Signed)
Can you look into Actemra for [name] for active seropositive RA? Baseline labs look okay. Previous intolerance to methotrexate and multiple TNF medications, has extensive vascular disease so do not recommend JAK inhibitors.

## 2021-12-11 NOTE — Progress Notes (Signed)
Labs look okay for starting Actemra for her RA. Inflammatory markers are highly elevated. Her white blood cell count is high at 17.7 this may be coming from her inflammation and prednisone use.

## 2021-12-11 NOTE — Telephone Encounter (Signed)
Addressed now in result note from the labs in question.

## 2021-12-12 ENCOUNTER — Telehealth: Payer: Self-pay | Admitting: Pharmacist

## 2021-12-12 NOTE — Telephone Encounter (Signed)
See lab note for details.  

## 2021-12-12 NOTE — Telephone Encounter (Signed)
I am okay with Darcus Pester as an alternative.

## 2021-12-12 NOTE — Telephone Encounter (Addendum)
Submitted a Prior Authorization request to [facility] for [place] via CoverMyMeds. Will update once we receive a response. ? ?Dose: '162mg'$  every 14 days ? ?Key: [place] ? ?Patient's preferred formulary options seem to be: Humira, Enbrel, Kevzara, Rinvoq, and mycophenolate ? ?If Actemra is approved, she will likely have to pursue patient assistance through [place] ? ?Knox Saliva, PharmD, MPH, BCPS ?Clinical Pharmacist (Rheumatology and Pulmonology) ? ? ?----- Message from Collier Salina, MD sent at 12/11/2021 11:26 PM EDT ----- ?Can you look into Actemra for Ms. Eulas Post for active seropositive RA? Baseline labs look okay. Previous intolerance to methotrexate and multiple TNF medications, has extensive vascular disease so do not recommend JAK inhibitors. ?

## 2021-12-12 NOTE — Telephone Encounter (Signed)
Received a fax regarding Prior Authorization from [facility] for Vanessa Lara. Authorization has been DENIED because patient try and fail preferred formulary agent Kevzara. ? ?Knox Saliva, PharmD, MPH, BCPS ?Clinical Pharmacist (Rheumatology and Pulmonology) ?

## 2021-12-14 ENCOUNTER — Encounter (HOSPITAL_COMMUNITY): Payer: Self-pay | Admitting: Hematology

## 2021-12-14 ENCOUNTER — Other Ambulatory Visit (HOSPITAL_COMMUNITY): Payer: Self-pay

## 2021-12-14 NOTE — Telephone Encounter (Signed)
Received notification from [facility] regarding a prior authorization for [facility]. Authorization has been APPROVED from 12/14/21 to 10/01/22.  ? ?Per test claim, patient has no copay for 28 day supply of 2 pens.  ? ?Patient can fill through [place]: [phone]  ? ?Authorization # [id number] ?Phone # [phone] ? ?Patient scheduled for Darcus Pester new start on 12/15/21.  ? ?Knox Saliva, PharmD, MPH, BCPS ?Clinical Pharmacist (Rheumatology and Pulmonology) ?

## 2021-12-14 NOTE — Telephone Encounter (Signed)
Submitted a Prior Authorization request to [facility] for [facility] via CoverMyMeds. Will update once we receive a response. ? ?Key: [place]ie [place] ? ?Patient is Medicare as primary and Medicaid as secondary, so copay will likely be $4 per month once approved. ? ?Knox Saliva, PharmD, MPH, BCPS ?Clinical Pharmacist (Rheumatology and Pulmonology) ?

## 2021-12-15 ENCOUNTER — Other Ambulatory Visit: Payer: Medicare HMO

## 2021-12-15 ENCOUNTER — Ambulatory Visit (INDEPENDENT_AMBULATORY_CARE_PROVIDER_SITE_OTHER): Payer: Medicare HMO | Admitting: Pharmacist

## 2021-12-15 ENCOUNTER — Ambulatory Visit (HOSPITAL_COMMUNITY)
Admission: RE | Admit: 2021-12-15 | Discharge: 2021-12-15 | Disposition: A | Discharge status: Home / Self Care | Payer: Medicare HMO | Source: Ambulatory Visit | Attending: Surgical | Admitting: Surgical

## 2021-12-15 ENCOUNTER — Other Ambulatory Visit: Payer: Self-pay

## 2021-12-15 ENCOUNTER — Other Ambulatory Visit (HOSPITAL_COMMUNITY): Payer: Self-pay

## 2021-12-15 ENCOUNTER — Ambulatory Visit (HOSPITAL_COMMUNITY)
Admission: RE | Admit: 2021-12-15 | Discharge: 2021-12-15 | Disposition: A | Discharge status: Home / Self Care | Payer: Medicare HMO | Source: Ambulatory Visit | Attending: Family Medicine | Admitting: Family Medicine

## 2021-12-15 DIAGNOSIS — G4453 Primary thunderclap headache: Secondary | ICD-10-CM | POA: Insufficient documentation

## 2021-12-15 DIAGNOSIS — Z5181 Encounter for therapeutic drug level monitoring: Secondary | ICD-10-CM

## 2021-12-15 DIAGNOSIS — M542 Cervicalgia: Secondary | ICD-10-CM | POA: Insufficient documentation

## 2021-12-15 DIAGNOSIS — Z79899 Other long term (current) drug therapy: Secondary | ICD-10-CM

## 2021-12-15 DIAGNOSIS — M0579 Rheumatoid arthritis with rheumatoid factor of multiple sites without organ or systems involvement: Secondary | ICD-10-CM

## 2021-12-15 MED ORDER — IOHEXOL 300 MG/ML  SOLN
80.0000 mL | Freq: Once | INTRAMUSCULAR | Status: AC | PRN
  Administered 2021-12-15: 75 mL via INTRAVENOUS

## 2021-12-15 MED ORDER — KEVZARA 200 MG/1.14ML ~~LOC~~ SOAJ
200.0000 mg | SUBCUTANEOUS | 3 refills | Status: DC
  Filled 2021-12-15: qty 2.28, fill #0
  Filled 2021-12-21: qty 2.28, 28d supply, fill #0
  Filled 2022-01-19: qty 2.28, 28d supply, fill #1
  Filled 2022-02-16: qty 2.28, 28d supply, fill #2
  Filled 2022-03-21: qty 2.28, 28d supply, fill #3

## 2021-12-15 NOTE — Patient Instructions (Addendum)
Your next [facility] dose is due on 12/29/21, 01/12/22, and every 14 days thereafter ? ?HOLD KEVZARA if you have signs or symptoms of an infection. You can resume once you feel better or back to your baseline. ?HOLD KEVZARA if you start antibiotics to treat an infection. ?HOLD KEVZARA around the time of surgery/procedures. Your surgeon will be able to provide recommendations on when to hold BEFORE and when you are cleared to [place]. ? ?Pharmacy information: ?Your prescription will be shipped from [facility]. ?Their phone number is [phone] ?Please call to schedule shipment and confirm address. They will mail your medication to your home. ? ?Labs are due in 1 month then every 3 months. ?Lab hours are from Monday to Thursday 1:30-4:30pm and Friday 1:30-4pm. You do not need an appointment if you come for labs during these times. ? ?How to manage an injection site reaction: ?Remember the 5 C's: ?COUNTER - leave on the counter at least 30 minutes but up to overnight to bring medication to room temperature. This may help prevent stinging ?COLD - place something cold (like an ice gel pack or cold water bottle) on the injection site just before cleansing with alcohol. This may help reduce pain ?CLARITIN - use Claritin (generic name is loratadine) for the first two weeks of treatment or the day of, the day before, and the day after injecting. This will help to minimize injection site reactions ?CORTISONE CREAM - apply if injection site is irritated and itching ?CALL ME - if injection site reaction is bigger than the size of your fist, looks infected, blisters, or if you develop hives ?

## 2021-12-15 NOTE — Progress Notes (Signed)
Pharmacy Note ? ?Subjective:   ?Patient presents to clinic today to receive first dose of Kevzara for rheumatoid arthritis. Patient has tried Simponi Aria infusions, Orencia infusions, and MTX in the past. ? ?Patient running a fever or have signs/symptoms of infection? No ? ?Patient currently on antibiotics for the treatment of infection? No ? ?Patient have any upcoming invasive procedures/surgeries? No ? ?Objective: ?CMP  ?   ?Component Value Date/Time  ? NA 136 12/05/2021 1052  ? NA 141 11/06/2019 1417  ? K 4.3 12/05/2021 1052  ? CL 102 12/05/2021 1052  ? CO2 23 12/05/2021 1052  ? GLUCOSE 93 12/05/2021 1052  ? BUN 14 12/05/2021 1052  ? BUN 10 11/06/2019 1417  ? CREATININE 0.67 12/05/2021 1052  ? CALCIUM 9.4 12/05/2021 1052  ? PROT 7.1 12/05/2021 1052  ? ALBUMIN 3.6 08/23/2020 1609  ? AST 11 12/05/2021 1052  ? AST 13 (L) 08/23/2020 1609  ? ALT 5 (L) 12/05/2021 1052  ? ALT 7 08/23/2020 1609  ? ALKPHOS 148 (H) 08/23/2020 1609  ? BILITOT 0.2 12/05/2021 1052  ? BILITOT 0.2 (L) 08/23/2020 1609  ? GFRNONAA >60 08/23/2020 1609  ? GFRAA 92 11/06/2019 1417  ? ? ?CBC ?   ?Component Value Date/Time  ? WBC 17.7 (H) 12/05/2021 1052  ? RBC 3.88 12/05/2021 1052  ? HGB 11.0 (L) 12/05/2021 1052  ? HGB 11.5 10/07/2020 1321  ? HCT 34.1 (L) 12/05/2021 1052  ? HCT 34.0 10/07/2020 1321  ? PLT 545 (H) 12/05/2021 1052  ? PLT 462 (H) 10/07/2020 1321  ? MCV 87.9 12/05/2021 1052  ? MCV 91 10/07/2020 1321  ? MCH 28.4 12/05/2021 1052  ? MCHC 32.3 12/05/2021 1052  ? RDW 15.2 (H) 12/05/2021 1052  ? RDW 13.7 10/07/2020 1321  ? LYMPHSABS 1,221 12/05/2021 1052  ? LYMPHSABS 2.9 10/07/2020 1321  ? MONOABS 0.7 09/07/2021 1318  ? EOSABS 18 12/05/2021 1052  ? EOSABS 0.1 10/07/2020 1321  ? BASOSABS 53 12/05/2021 1052  ? BASOSABS 0.1 10/07/2020 1321  ? ? ?Baseline Immunosuppressant Therapy Labs ?TB GOLD ?Quantiferon TB Gold Latest Ref Rng & Units 12/05/2021  ?Quantiferon TB Gold Plus NEGATIVE NEGATIVE  ? ?Hepatitis Panel ?  ?HIV ?No results found for:  HIV ?Immunoglobulins ?  ?SPEP ?Serum Protein Electrophoresis Latest Ref Rng & Units 12/05/2021  ?Total Protein 6.1 - 8.1 g/dL 7.1  ?Albumin 2.9 - 4.4 g/dL -  ?Alpha-1 0.0 - 0.4 g/dL -  ?Alpha-2 0.4 - 1.0 g/dL -  ?Beta Globulin 0.7 - 1.3 g/dL -  ?Gamma Globulin 0.4 - 1.8 g/dL -  ?Interpretation - -  ? ?G6PD ?No results found for: G6PDH ?TPMT ?No results found for: TPMT  ? ?Chest x-ray: 01/13/21 -  ? ?Assessment/Plan:  ?Demonstrated proper injection technique with Kevzara demo device  Patient able to demonstrate proper injection technique using the teach back method.  Patient self injected in the right thigh with: ? ?Sample Medication: Darcus Pester '200mg'$ /1.14 ml autoinjector ?[place]: [phone] ?Lot: [id number] ?Expiration: 03/01/2022 ? ?Patient tolerated well.  Observed for 30 mins in office for adverse reaction and none noted.  ? ?Patient is to return in 1 month for labs and 6-8 weeks for follow-up appointment.  Standing orders for CBC, CMP, and lipid panel placed.  ? ?Darcus Pester approved through insurance .   Rx sent to: [facility]: [phone] .  Patient provided with pharmacy phone number and advised to call later this week to schedule shipment to home. ? ?She will continue [place]  $'200mg'l$  SQ every 14 days. ? ?All questions encouraged and answered.  Instructed patient to call with any further questions or concerns. ? ?Knox Saliva, PharmD, MPH, BCPS ?Clinical Pharmacist (Rheumatology and Pulmonology) ? ?12/15/2021 8:27 AM ?

## 2021-12-16 ENCOUNTER — Telehealth: Payer: Self-pay | Admitting: Orthopedic Surgery

## 2021-12-16 NOTE — Telephone Encounter (Signed)
Pt called wanting to know Ct scan results. ? ?CB [phone] ?

## 2021-12-19 ENCOUNTER — Other Ambulatory Visit: Payer: Self-pay | Admitting: Radiology

## 2021-12-19 DIAGNOSIS — M542 Cervicalgia: Secondary | ICD-10-CM

## 2021-12-19 NOTE — Progress Notes (Signed)
Can we set her up with [facility] for c-spine esi

## 2021-12-20 ENCOUNTER — Telehealth: Payer: Self-pay

## 2021-12-20 NOTE — Telephone Encounter (Signed)
Patient called into the office stating that she is begging for her gel shots. I do not see where anything has been approved. Can you please advise on this ?  ?

## 2021-12-20 NOTE — Telephone Encounter (Signed)
noted 

## 2021-12-20 NOTE — Telephone Encounter (Signed)
I called patient. She was asking for repeat gel injection in left knee as it is popping every time she moves it and is very painful. She states that she cannot wait until April to get it.  I advised it must be 6 months since her last injection before insurance will approve gel injection again. It appears that the left knee gel injection was actually done 09/22/2021 which would mean that she cannot get gel injection again until June. She would like to know if there is anything at all that can be done for the pain that she is in, cortisone, etc., until she is seen by Dr. Ernestina Patches. I advised that the appt with him is for an injection in her neck. She expressed understanding but has considerable pain in her shoulder and knee and would like anything at all that may help her. ? ?Please advise. ?

## 2021-12-20 NOTE — Telephone Encounter (Signed)
Can try a repeat cortisone injection in early April which will be about 3 months out from her last knee injection in January.  That's her best option most likely as we wait for the gel injection in June

## 2021-12-20 NOTE — Telephone Encounter (Signed)
Called and discussed about c-spine MRI yesterday

## 2021-12-21 ENCOUNTER — Other Ambulatory Visit (HOSPITAL_COMMUNITY): Payer: Self-pay

## 2021-12-21 NOTE — Telephone Encounter (Signed)
Delivery instructions have been updated in [place], medication will be shipped to patient's home address by 12/23/21. ? ?Rx has been processed in [facility] and the patient has no copay at this time. ?

## 2021-12-21 NOTE — Telephone Encounter (Signed)
I would continue with what she's been doing and reassure her that I have no concern that her knee will fall out of socket based on her last MRI scan

## 2021-12-21 NOTE — Telephone Encounter (Signed)
I spoke with patient to advise. She would like to know what you want her to do in the meantime to keep her knee from falling out of socket. ?Please advise. ?

## 2021-12-23 NOTE — Telephone Encounter (Signed)
I tried to reach [place] to advise with no answer. I can try her again next week. ?

## 2021-12-26 ENCOUNTER — Telehealth: Payer: Self-pay | Admitting: Surgical

## 2021-12-26 NOTE — Telephone Encounter (Signed)
Tried calling again. No answer.

## 2021-12-26 NOTE — Telephone Encounter (Signed)
Yes this is okay 

## 2021-12-26 NOTE — Telephone Encounter (Signed)
Pt called asking for injection medication she takes daily for her bone. Unsure of which injection it is. Please call pt at [phone] ?

## 2021-12-26 NOTE — Telephone Encounter (Signed)
I thought last time we advised patient should come from PCP. Is this ok to tell patient again? ? ?

## 2021-12-27 ENCOUNTER — Encounter: Payer: Self-pay | Admitting: Psychiatry

## 2021-12-27 ENCOUNTER — Ambulatory Visit (INDEPENDENT_AMBULATORY_CARE_PROVIDER_SITE_OTHER): Payer: Medicare HMO | Admitting: Psychiatry

## 2021-12-27 VITALS — BP 94/62 | HR 78 | Ht 59.0 in | Wt 73.0 lb

## 2021-12-27 DIAGNOSIS — G4451 Hemicrania continua: Secondary | ICD-10-CM | POA: Diagnosis not present

## 2021-12-27 MED ORDER — INDOMETHACIN 25 MG PO CAPS: ORAL_CAPSULE | ORAL | 0 refills | Status: AC

## 2021-12-27 NOTE — Telephone Encounter (Signed)
Tried calling patient to advise. No answer. LMVM for her to [facility] to discuss.  ?

## 2021-12-27 NOTE — Progress Notes (Signed)
? ?Referring:  ?Donella Stade, PA-C ?[address] ?[place],  [place] [address] ? ?PCP: ?Pablo Lawrence, NP ? ?Neurology was asked to evaluate Vanessa Lara a 66 year old feale for a chief complaint of headaches.  Our recommendations of care will be communicated by shared medical record.   ? ?CC:  headaches ? ?History provided from self ? ?HPI:  ?Medical co-morbidities: Rheumatoid arthritis, B12 deficiency, HLD ? ?The patient presents for evaluation of headaches which began 3 months ago. They have been progressively worsening over that time. Headaches are described as a constant 3-5/10 left-sided pain with superimposed "100/10" stabbing pains ~3 times per week. They are associated with photophobia in the left eye, no phonophobia or nausea. She also notes that her left eye will become swollen and droopy, and her nose will run on the left. Denies vision changes or jaw claudication. She also reports word finding difficulty and trouble with short term memory. ? ?She recently had a CTH which showed a normal brain and mildly improved chronic left sphenoid sinusitis. MRI C-spine showed moderate left and severe right foraminal stenosis. ? ?She takes gabapentin 100 mg every 6 hours which helps a little bit but makes her feel drunk. She is planned to get cervical injections next month. ? ?Recent ESR and CRP were elevated. She has a history of active Rheumatoid arthritis and was started on Sarilumab 2 weeks ago. ? ? ?Headache History: ?Onset: 3 months ago ?Triggers: none ?Aura: no ?Location: left frontal ?Quality/Description: stabbing, bold of lightning ?Associated Symptoms: ? Photophobia: yes (left eye) ? Phonophobia: no ? Nausea: no ?Other symptoms: left eye will get swollen and droopy, nose will run  ?Worse with activity?: yes ?Duration of headaches: constant, stabbing pain lasts seconds ? ?Headache days per month: 30 ?Headache free days per month: 0 ? ?Current Treatment: ?Abortive ?Tylenol ? ?Preventative ?Gabapentin  100 mg every 6 hours ? ?Prior Therapies                                 ?Paxil ?metoprolol ?Meloxicam ?Baclofen ?Gabapentin ?Cymbalta 20 mg daily ?Tramadol - makes her drowsy ?Tizanidine ? ?Headache Risk Factors: ?Headache risk factors and/or co-morbidities ?(+) Neck Pain ? ?LABS: ?12/05/21 CBC with leukocytosis (WBC 17) in the setting of prednisone use ? ?CRP 32.9, ESR 63 - hx of active RA ? ?IMAGING:  ?MRI Cervical spine: ?1. At C5-6 there is a mild broad-based disc bulge. Bilateral ?uncovertebral degenerative changes. Moderate left and severe right ?foraminal stenosis. ?2. Otherwise, mild cervical spine spondylosis as described above. ? ?Imaging independently reviewed on December 27, 2021  ? ?Current Outpatient Medications on File Prior to Visit  ?Medication Sig Dispense Refill  ? acetaminophen (TYLENOL) 500 MG tablet Take 500 mg by mouth every 6 (six) hours as needed.    ? atorvastatin (LIPITOR) 10 MG tablet Take 1 tablet (10 mg total) by mouth daily. 90 tablet 3  ? baclofen (LIORESAL) 10 MG tablet TAKE 2 TABLETS(20 MG) BY MOUTH THREE TIMES DAILY 180 tablet 0  ? BD SYRINGE SLIP TIP 25G X 5/8" 1 ML MISC USE AS DIRECTED FOR ONCE MONTHLY B12 INJECTIONS    ? cyanocobalamin (,VITAMIN B-12,) 1000 MCG/ML injection Inject 1,000 mcg into the skin every 30 (thirty) days.    ? dicyclomine (BENTYL) 10 MG capsule Take 1 capsule (10 mg total) by mouth 4 (four) times daily as needed for spasms. 360 capsule 1  ? dronabinol (MARINOL) 2.5  MG capsule Take by mouth.    ? fentaNYL (DURAGESIC) 25 MCG/HR Place 1 patch onto the skin every 3 (three) days.    ? FORTEO 600 MCG/2.4ML SOPN ADMINISTER 0.08 ML UNDER THE SKIN DAILY 2.4 mL 0  ? furosemide (LASIX) 20 MG tablet Take 20 mg by mouth daily as needed.    ? gabapentin (NEURONTIN) 250 MG/5ML solution Take 2 mLs (100 mg total) by mouth at bedtime. (Patient taking differently: Take 100 mg by mouth at bedtime as needed.) 180 mL 1  ? Heat Wraps (THERMACARE BACK/HIP) MISC 1 application by Does  not apply route daily. 3 each 11  ? Hydroactive Dressings ([employer] HYDROCOLLOID PADS) PADS Apply 1 application topically daily. 6 each 6  ? Insulin Pen Needle 32G X 4 MM MISC by Does not apply route.    ? lidocaine (LIDODERM) 5 % one patch daily.    ? lipase/protease/amylase (CREON) 12000-38000 units CPEP capsule Use 1 capsule with meals and snacks 270 capsule 1  ? LORazepam (ATIVAN) 0.5 MG tablet TAKE 1/2 TO 1 TABLET BY MOUTH AT BEDTIME AS NEEDED 60 tablet 0  ? megestrol (MEGACE) 40 MG tablet Take 1 tablet (40 mg total) by mouth daily. 30 tablet 6  ? meloxicam (MOBIC) 15 MG tablet TAKE 1/2 TO 1 TABLET(7.5 TO 15 MG) BY MOUTH DAILY AS NEEDED FOR PAIN 90 tablet 0  ? meloxicam (MOBIC) 15 MG tablet Take 15 mg by mouth daily.    ? metoprolol tartrate (LOPRESSOR) 25 MG tablet     ? naloxone (NARCAN) nasal spray 4 mg/0.1 mL SMARTSIG:Both Nares    ? ondansetron (ZOFRAN-ODT) 4 MG disintegrating tablet Take 4 mg by mouth every 8 (eight) hours as needed.    ? pantoprazole (PROTONIX) 40 MG tablet Take 1 tablet (40 mg total) by mouth 2 (two) times daily. TAKE 1 TABLET(40 MG) BY MOUTH TWICE DAILY 180 tablet 3  ? PARoxetine (PAXIL) 10 MG tablet TAKE 1 TABLET(10 MG) BY MOUTH DAILY (Patient taking differently: Take 20 mg by mouth daily. TAKE 1 TABLET(10 MG) BY MOUTH DAILY) 90 tablet 1  ? predniSONE (DELTASONE) 5 MG tablet Take by mouth.    ? promethazine (PHENERGAN) 25 MG tablet Take 0.5-1 tablets (12.5-25 mg total) by mouth every 6 (six) hours as needed for nausea or vomiting. 20 tablet 0  ? Sarilumab (KEVZARA) 200 MG/1.14ML SOAJ Inject 200 mg into the skin every 14 (fourteen) days. 2.28 mL 3  ? Syringe/Needle, Disp, (SYRINGE 3CC/23GX1") 23G X 1" 3 ML MISC by Does not apply route.    ? tiZANidine (ZANAFLEX) 2 MG tablet Take 1 tablet (2 mg total) by mouth every 8 (eight) hours as needed for muscle spasms. 30 tablet 0  ? traMADol (ULTRAM) 50 MG tablet Take 1 tablet (50 mg total) by mouth every 12 (twelve) hours as needed. 30 tablet 0  ?  traZODone (DESYREL) 100 MG tablet Take 1 tablet (100 mg total) by mouth at bedtime as needed for sleep. 30 tablet 6  ? ?No current facility-administered medications on file prior to visit.  ? ? ? ?Allergies: ?Allergies  ?Allergen Reactions  ? Abatacept Other (See Comments)  ? Codeine Other (See Comments)  ?  Stomach pains/constipation- due to IBS  ? Golimumab Other (See Comments)  ? Hydrocodone Other (See Comments)  ?  She does not want to take this due to intolerance to codeine.  ? Methylprednisolone Diarrhea and Other (See Comments)  ?  "Made my IBS flare up"  ?Diarrhea, nausea  ?  Methotrexate Derivatives Other (See Comments)  ?  Agitation; pt stated, "I get no sleep; one dose caused no sleep for 7 days and 7 nights - that was when I got a 0.5 injection"  ? Prednisone Rash  ?  Cannot take prednisone by mouth ---- red rash all over  ? ? ?Family History: ?Migraine or other headaches in the family:  no ?Aneurysms in a first degree relative:  no ?Brain tumors in the family:  father had a pituitary tumor ?Other neurological illness in the family:   sister had lesions ? ?Past Medical History: ?Past Medical History:  ?Diagnosis Date  ? Anemia   ? Anxiety   ? Arthritis   ? RA  ? Cancer [facility])   ? Skin  ? CHF (congestive heart failure) ([place])   ? Collagen vascular disease ([place])   ? Depression   ? Endometriosis   ? High cholesterol   ? IBS (irritable bowel syndrome)   ? Lymphadenopathy   ? Mitral valve prolapse   ? RA (rheumatoid arthritis) ([place])   ? Rotator cuff tear   ? bilateral  ? Uterus, adenomyosis   ? ? ?Past Surgical History ?Past Surgical History:  ?Procedure Laterality Date  ? AXILLARY LYMPH NODE BIOPSY Right 10/12/2020  ? Procedure: RIGHT AXILLARY LYMPH NODE BIOPSY EXCISION;  Surgeon: Stark Klein, MD;  Location: [facility];  Service: General;  Laterality: Right;  ? BUNIONECTOMY Right   ? CARPAL TUNNEL RELEASE Right   ? COLONOSCOPY    ? COLONOSCOPY N/A 12/13/2015  ? Procedure: COLONOSCOPY;  Surgeon:  Rogene Houston, MD;  [facility];  Service: Endoscopy;  Laterality: N/A;  9:55  ? Fatty tumor    ? 2011 (left arm)  ? FOOT SURGERY    ? hammertoe  ? TONSILLECTOMY    ? TOTAL ABDOMINAL HYST

## 2021-12-27 NOTE — Patient Instructions (Addendum)
Week  1:  ?-Please start indomethacin (indocin) 25 mg (one capsule) 3 times per day with meals. ?-If, in one week, you are headache-free, this is your dose, stay on it. ?-If, in one week, you are tolerating the indocin, but have headache, then increase the dose as below. ?Week 2: ?-Please increase indocin to 50 mg (2 capsules) 3 times per day with meals. ?-If, in one week, you are headache-free, this is your dose, stay on it. ?-If, in one week, you are tolerating the indocin, but have headache, then increase the dose as below. ?Weeks 3 and 4:  ?-Please increase indocin to 75 mg (3 capsules) 3 times per day with meals. ?-If, in one week, you are headache-free, this is your dose, stay on it. ?-If, in one week, you have partial relief, call or see her neurologist. ?-If, in one week, you have no relief, stop the indocin. ? ?Please do not take Meloxicam while taking this medication ? ?

## 2021-12-28 ENCOUNTER — Other Ambulatory Visit: Payer: Self-pay | Admitting: Specialist

## 2021-12-28 DIAGNOSIS — M5030 Other cervical disc degeneration, unspecified cervical region: Secondary | ICD-10-CM | POA: Insufficient documentation

## 2021-12-30 ENCOUNTER — Telehealth: Payer: Self-pay | Admitting: Orthopedic Surgery

## 2021-12-30 NOTE — Telephone Encounter (Signed)
Please call you if we are doing injection on her knee and shoulder  ?

## 2022-01-02 ENCOUNTER — Telehealth: Payer: Self-pay

## 2022-01-02 ENCOUNTER — Other Ambulatory Visit: Payer: Self-pay

## 2022-01-02 ENCOUNTER — Ambulatory Visit (INDEPENDENT_AMBULATORY_CARE_PROVIDER_SITE_OTHER): Payer: Medicare HMO | Admitting: Surgical

## 2022-01-02 VITALS — Wt 73.6 lb

## 2022-01-02 DIAGNOSIS — M21612 Bunion of left foot: Secondary | ICD-10-CM | POA: Diagnosis not present

## 2022-01-02 DIAGNOSIS — M542 Cervicalgia: Secondary | ICD-10-CM

## 2022-01-02 DIAGNOSIS — M170 Bilateral primary osteoarthritis of knee: Secondary | ICD-10-CM | POA: Diagnosis not present

## 2022-01-02 DIAGNOSIS — M1712 Unilateral primary osteoarthritis, left knee: Secondary | ICD-10-CM

## 2022-01-02 DIAGNOSIS — M1711 Unilateral primary osteoarthritis, right knee: Secondary | ICD-10-CM

## 2022-01-02 DIAGNOSIS — M79672 Pain in left foot: Secondary | ICD-10-CM

## 2022-01-02 NOTE — Telephone Encounter (Signed)
Auth needed for bilat knee gel injections  

## 2022-01-02 NOTE — Telephone Encounter (Signed)
Pt calling to see if she missed the call - ?

## 2022-01-02 NOTE — Telephone Encounter (Signed)
Will call patient to discuss gel injections. ? ?

## 2022-01-03 ENCOUNTER — Telehealth: Payer: Self-pay

## 2022-01-03 ENCOUNTER — Telehealth: Payer: Self-pay | Admitting: Surgical

## 2022-01-03 NOTE — Telephone Encounter (Signed)
Pt called to say thank you to [facility]. No call back needed ?

## 2022-01-03 NOTE — Telephone Encounter (Addendum)
Talked with patient about gel injections and appointments have been scheduled.  Next available gel injection for left knee is after 03/23/2022. ? ?BV pending for Monovisc, right knee. ? ?

## 2022-01-03 NOTE — Telephone Encounter (Signed)
Patient would like to be called for earlier appointment if available.  Patient is scheduled for 01/18/2022 for injection. ? ?

## 2022-01-05 ENCOUNTER — Inpatient Hospital Stay (HOSPITAL_COMMUNITY): Payer: Medicare HMO | Attending: Hematology

## 2022-01-05 DIAGNOSIS — E559 Vitamin D deficiency, unspecified: Secondary | ICD-10-CM | POA: Insufficient documentation

## 2022-01-05 DIAGNOSIS — D509 Iron deficiency anemia, unspecified: Secondary | ICD-10-CM | POA: Diagnosis present

## 2022-01-05 DIAGNOSIS — D72829 Elevated white blood cell count, unspecified: Secondary | ICD-10-CM | POA: Diagnosis not present

## 2022-01-05 LAB — CBC WITH DIFFERENTIAL/PLATELET
Abs Immature Granulocytes: 0.03 10*3/uL (ref 0.00–0.07)
Basophils Absolute: 0.1 10*3/uL (ref 0.0–0.1)
Basophils Relative: 1 %
Eosinophils Absolute: 0.1 10*3/uL (ref 0.0–0.5)
Eosinophils Relative: 1 %
HCT: 40 % (ref 36.0–46.0)
Hemoglobin: 12.6 g/dL (ref 12.0–15.0)
Immature Granulocytes: 0 %
Lymphocytes Relative: 34 %
Lymphs Abs: 3.3 10*3/uL (ref 0.7–4.0)
MCH: 28.2 pg (ref 26.0–34.0)
MCHC: 31.5 g/dL (ref 30.0–36.0)
MCV: 89.5 fL (ref 80.0–100.0)
Monocytes Absolute: 0.8 10*3/uL (ref 0.1–1.0)
Monocytes Relative: 8 %
Neutro Abs: 5.3 10*3/uL (ref 1.7–7.7)
Neutrophils Relative %: 56 %
Platelets: 320 10*3/uL (ref 150–400)
RBC: 4.47 MIL/uL (ref 3.87–5.11)
RDW: 15.8 % — ABNORMAL HIGH (ref 11.5–15.5)
WBC: 9.6 10*3/uL (ref 4.0–10.5)
nRBC: 0 % (ref 0.0–0.2)

## 2022-01-05 LAB — FERRITIN: Ferritin: 175 ng/mL (ref 11–307)

## 2022-01-05 LAB — VITAMIN D 25 HYDROXY (VIT D DEFICIENCY, FRACTURES): Vit D, 25-Hydroxy: 59.47 ng/mL (ref 30–100)

## 2022-01-05 LAB — VITAMIN B12: Vitamin B-12: 1486 pg/mL — ABNORMAL HIGH (ref 180–914)

## 2022-01-05 LAB — IRON AND TIBC
Iron: 118 ug/dL (ref 28–170)
Saturation Ratios: 38 % — ABNORMAL HIGH (ref 10.4–31.8)
TIBC: 312 ug/dL (ref 250–450)
UIBC: 194 ug/dL

## 2022-01-05 LAB — FOLATE: Folate: 5.8 ng/mL — ABNORMAL LOW (ref >5.9)

## 2022-01-08 ENCOUNTER — Encounter: Payer: Self-pay | Admitting: Orthopedic Surgery

## 2022-01-08 LAB — METHYLMALONIC ACID, SERUM: Methylmalonic Acid, Quantitative: 172 nmol/L (ref 0–378)

## 2022-01-08 MED ORDER — BUPIVACAINE HCL 0.25 % IJ SOLN
4.0000 mL | INTRAMUSCULAR | Status: AC | PRN
  Administered 2022-01-02: 4 mL via INTRA_ARTICULAR

## 2022-01-08 MED ORDER — METHYLPREDNISOLONE ACETATE 40 MG/ML IJ SUSP
40.0000 mg | INTRAMUSCULAR | Status: AC | PRN
  Administered 2022-01-02: 40 mg via INTRA_ARTICULAR

## 2022-01-08 MED ORDER — LIDOCAINE HCL 1 % IJ SOLN
5.0000 mL | INTRAMUSCULAR | Status: AC | PRN
  Administered 2022-01-02: 5 mL

## 2022-01-08 NOTE — Progress Notes (Signed)
? ?Office Visit Note ?  ?Patient: Vanessa Lara           ?Date of Birth: 01/02/1956           ?MRN: [id number] ?Visit Date: 01/02/2022 ?Requested by: Pablo Lawrence, NP ?[address] [address],  [place] [id number] ?PCP: Pablo Lawrence, NP ? ?Subjective: ?No chief complaint on file. ? ? ?HPI: Vanessa Lara is a 66 y.o. female who presents to the office complaining of bilateral knee pain.  Patient has history of bilateral knee osteoarthritis.  She states her knee pain has flared up without any recent injury.  She has not had any recent injection with last injection in December 2022 which was a gel injection by Benita Stabile, PA-C.  She also continues to complain of right shoulder pain; she is currently scheduled for cervical spine ESI with Dr. Ernestina Patches later this month.  She also reports left toe pain with crossing over of her first and second toes..   ? ?She has recently started biologic medication for her rheumatoid arthritis with Dr. Benjamine Mola and states that this has been very helpful for her multiple joint complaints. ?             ?ROS: All systems reviewed are negative as they relate to the chief complaint within the history of present illness.  Patient denies fevers or chills. ? ?Assessment & Plan: ?Visit Diagnoses:  ?1. Primary osteoarthritis of left knee   ?2. Primary osteoarthritis of right knee   ?3. Bunion of great toe of left foot   ? ? ?Plan: Patient is a 66 year old female who returns for review of cervical spine MRI as well as for evaluation of bilateral knee pain.  She has history of bilateral knee osteoarthritis.  She would like to try repeat injections today.  Tolerated both injections well.  Also plan to preapproved her for bilateral knee gel injections to try in 3 months.  Reviewed the cervical spine MRI with her today and she is currently scheduled for cervical spine ESI with Dr. Ernestina Patches later this month.  She was added to the wait list due to her severe pain that she is experiencing in the  shoulder.  [name] also complains of toe pain in the left foot.  She has a bunion with crossing over of the second toe over the first toe due to the bunion.  This is causing her significant discomfort.  Plan to refer patient to Dr. Sharol Given for further evaluation.  She has seen a podiatrist in the past for this problem as well.  She denies any surgical intervention for her bunion. ? ?This patient is diagnosed with osteoarthritis of the knee(s).   ? ?Radiographs show evidence of joint space narrowing, osteophytes, subchondral sclerosis and/or subchondral cysts.  This patient has knee pain which interferes with functional and activities of daily living.   ? ?This patient has experienced inadequate response, adverse effects and/or intolerance with conservative treatments such as acetaminophen, NSAIDS, topical creams, physical therapy or regular exercise, knee bracing and/or weight loss.  ? ?This patient has experienced inadequate response or has a contraindication to intra articular steroid injections for at least 3 months.  ? ?This patient is not scheduled to have a total knee replacement within 6 months of starting treatment with viscosupplementation. ? ? ?Follow-Up Instructions: No follow-ups on file.  ? ?Orders:  ?No orders of the defined types were placed in this encounter. ? ?No orders of the defined types were placed  in this encounter. ? ? ? ? Procedures: ?Large Joint Inj: bilateral knee on 01/02/2022 1:05 PM ?Indications: diagnostic evaluation, joint swelling and pain ?Details: 18 G 1.5 in needle, superolateral approach ? ?Arthrogram: No ? ?Medications (Right): 5 mL lidocaine 1 %; 4 mL bupivacaine 0.25 %; 40 mg methylPREDNISolone acetate 40 MG/ML ?Medications (Left): 5 mL lidocaine 1 %; 4 mL bupivacaine 0.25 %; 40 mg methylPREDNISolone acetate 40 MG/ML ?Outcome: tolerated well, no immediate complications ?Procedure, treatment alternatives, risks and benefits explained, specific risks discussed. Consent was given by the  patient. Immediately prior to procedure a time out was called to verify the correct patient, procedure, equipment, support staff and site/side marked as required. Patient was prepped and draped in the usual sterile fashion.  ? ? ? ? ?Clinical Data: ?No additional findings. ? ?Objective: ?Vital Signs: Wt 73 lb 9.6 oz (33.4 kg)   BMI 14.87 kg/m?  ? ?Physical Exam:  ?Constitutional: Patient appears well-developed ?HEENT:  ?Head: Normocephalic ?Eyes:EOM are normal ?Neck: Normal range of motion ?Cardiovascular: Normal rate ?Pulmonary/chest: Effort normal ?Neurologic: Patient is alert ?Skin: Skin is warm ?Psychiatric: Patient has normal mood and affect ? ?Ortho Exam: Ortho exam demonstrates no effusion of either knee.  Tenderness over the medial and lateral joint lines of both knees.  No pain with hip range of motion.  Patient is able to perform straight leg raise with both legs.  No cellulitis or skin changes noted to either knee.  No calf tenderness.  Negative Homans' sign.  Range of motion from 0 degrees extension to 120 degrees of knee flexion bilaterally. ? ?Specialty Comments:  ?MRI CERVICAL SPINE WITHOUT CONTRAST ?  ?TECHNIQUE: ?Multiplanar, multisequence MR imaging of the cervical spine was ?performed. No intravenous contrast was administered. ?  ?COMPARISON:  None. ?  ?FINDINGS: ?Alignment: 2 mm retrolisthesis of C5 on C6. ?  ?Vertebrae: No acute fracture, evidence of discitis, or bone lesion. ?  ?Cord: Normal signal and morphology. ?  ?Posterior Fossa, vertebral arteries, paraspinal tissues: Posterior ?fossa demonstrates no focal abnormality. Vertebral artery flow voids ?are maintained. Paraspinal soft tissues are unremarkable. ?  ?Disc levels: ?  ?Discs: Degenerative disease with disc height loss at C4-5 and C5-6. ?  ?C2-3: No significant disc bulge. No neural foraminal stenosis. No ?central canal stenosis. ?  ?C3-4: Mild broad-based disc bulge. No foraminal or central canal ?stenosis. ?  ?C4-5: Mild  broad-based disc bulge. No foraminal or central canal ?stenosis. ?  ?C5-6: Mild broad-based disc bulge. Bilateral uncovertebral ?degenerative changes. Moderate left and severe right foraminal ?stenosis. No spinal stenosis. ?  ?C6-7: Mild broad-based disc bulge. No foraminal or central canal ?stenosis. ?  ?C7-T1: No significant disc bulge. No neural foraminal stenosis. No ?central canal stenosis. ?  ?IMPRESSION: ?1. At C5-6 there is a mild broad-based disc bulge. Bilateral ?uncovertebral degenerative changes. Moderate left and severe right ?foraminal stenosis. ?2. Otherwise, mild cervical spine spondylosis as described above. ?  ?  ?Electronically Signed ?  By: Kathreen Devoid M.D. ?  On: 12/16/2021 10:51 ? ?Imaging: ?No results found. ? ? ?PMFS History: ?Patient Active Problem List  ? Diagnosis Date Noted  ? Medication monitoring encounter 12/05/2021  ? Physical deconditioning 11/11/2021  ? Elevated serum lactate dehydrogenase (LDH) 08/17/2021  ? Vitamin D deficiency 08/17/2021  ? Chronic fatigue 07/25/2021  ? Vitamin B12 deficiency 07/19/2021  ? Hardening of the aorta (main artery of the heart) ([place]) 06/21/2021  ? Solitary lung nodule 06/15/2021  ? Loss of weight 05/18/2021  ? Protein-calorie  malnutrition, severe ([place]) 05/18/2021  ? Osteoporosis 06/15/2020  ? Iron deficiency anemia 04/09/2020  ? Primary osteoarthritis involving multiple joints 12/31/2018  ? Rheumatoid arthritis involving multiple sites with positive rheumatoid factor ([place]) 12/31/2018  ? Irritable bowel syndrome with both constipation and diarrhea 10/28/2018  ? Constipation 03/14/2016  ? Mitral valve prolapse 11/23/2015  ? High cholesterol 11/23/2015  ? ?Past Medical History:  ?Diagnosis Date  ? Anemia   ? Anxiety   ? Arthritis   ? RA  ? Cancer [facility])   ? Skin  ? CHF (congestive heart failure) ([place])   ? Collagen vascular disease ([place])   ? Depression   ? Endometriosis   ? High cholesterol   ? IBS (irritable bowel syndrome)   ? Lymphadenopathy   ? Mitral  valve prolapse   ? RA (rheumatoid arthritis) ([place])   ? Rotator cuff tear   ? bilateral  ? Uterus, adenomyosis   ?  ?Family History  ?Problem Relation Age of Onset  ? Colon cancer Other   ? Bone cancer Mother   ?

## 2022-01-09 NOTE — Progress Notes (Deleted)
RESCHEDULED

## 2022-01-10 ENCOUNTER — Inpatient Hospital Stay (HOSPITAL_COMMUNITY): Payer: Medicare HMO | Admitting: Physician Assistant

## 2022-01-10 ENCOUNTER — Telehealth: Payer: Self-pay

## 2022-01-10 NOTE — Telephone Encounter (Signed)
Approved for Monovisc, right knee. ?[place] ?Patient is covered at 100% through her insurance. ?No Co-pay ?No PA required ? ?Appt.01/20/2022 with Dr. Marlou Sa ?

## 2022-01-12 ENCOUNTER — Ambulatory Visit: Payer: Medicare HMO | Admitting: Orthopedic Surgery

## 2022-01-16 ENCOUNTER — Other Ambulatory Visit (HOSPITAL_COMMUNITY): Payer: Self-pay

## 2022-01-18 ENCOUNTER — Ambulatory Visit (INDEPENDENT_AMBULATORY_CARE_PROVIDER_SITE_OTHER): Payer: Medicare HMO | Admitting: Physical Medicine and Rehabilitation

## 2022-01-18 ENCOUNTER — Other Ambulatory Visit (HOSPITAL_COMMUNITY): Payer: Self-pay

## 2022-01-18 ENCOUNTER — Ambulatory Visit: Payer: Self-pay

## 2022-01-18 ENCOUNTER — Encounter: Payer: Self-pay | Admitting: Physical Medicine and Rehabilitation

## 2022-01-18 VITALS — BP 132/80 | HR 88

## 2022-01-18 DIAGNOSIS — M5412 Radiculopathy, cervical region: Secondary | ICD-10-CM | POA: Diagnosis not present

## 2022-01-18 MED ORDER — METHYLPREDNISOLONE ACETATE 80 MG/ML IJ SUSP
80.0000 mg | Freq: Once | INTRAMUSCULAR | Status: AC
  Administered 2022-01-18: 80 mg

## 2022-01-18 NOTE — Progress Notes (Signed)
Pt state neck pain that travels to her front of her head. Pt state anything makes the pain worse. Pt state she takes over the counter pain meds to help ease pain. ? ?Numeric Pain Rating Scale and Functional Assessment ?Average Pain 9 ? ? ?In the last MONTH (on 0-10 scale) has pain interfered with the following? ? ?1. General activity like being  able to carry out your everyday physical activities such as walking, climbing stairs, carrying groceries, or moving a chair?  ?Rating(10) ? ? ?+Driver, -BT, -Dye Allergies. ? ?

## 2022-01-18 NOTE — Patient Instructions (Signed)

## 2022-01-19 ENCOUNTER — Other Ambulatory Visit (HOSPITAL_COMMUNITY): Payer: Self-pay

## 2022-01-20 ENCOUNTER — Encounter: Payer: Self-pay | Admitting: Surgical

## 2022-01-20 ENCOUNTER — Ambulatory Visit (INDEPENDENT_AMBULATORY_CARE_PROVIDER_SITE_OTHER): Payer: Medicare HMO | Admitting: Surgical

## 2022-01-20 DIAGNOSIS — M1711 Unilateral primary osteoarthritis, right knee: Secondary | ICD-10-CM

## 2022-01-20 MED ORDER — LIDOCAINE HCL 1 % IJ SOLN
5.0000 mL | INTRAMUSCULAR | Status: AC | PRN
  Administered 2022-01-20: 5 mL

## 2022-01-20 MED ORDER — HYALURONAN 88 MG/4ML IX SOSY
88.0000 mg | PREFILLED_SYRINGE | INTRA_ARTICULAR | Status: AC | PRN
  Administered 2022-01-20: 88 mg via INTRA_ARTICULAR

## 2022-01-20 NOTE — Progress Notes (Signed)
? ?  Procedure Note ? ?Patient: Vanessa Lara             ?Date of Birth: 1956-01-08           ?MRN: [id number]             ?Visit Date: 01/20/2022 ? ?Procedures: ?Visit Diagnoses:  ?1. Primary osteoarthritis of right knee   ? ? ?Large Joint Inj: R knee on 01/20/2022 2:54 PM ?Indications: pain, joint swelling and diagnostic evaluation ?Details: 18 G 1.5 in needle, superolateral approach ? ?Arthrogram: No ? ?Medications: 5 mL lidocaine 1 %; 88 mg Hyaluronan 88 MG/4ML ?Outcome: tolerated well, no immediate complications ? ?Next possible LEFT gel injection is 03/24/22 following last injection on 09/22/22.  Patient doing well with good relief from cortisone injections in both knees at last visit. Also reports good relief of some of her frontal headache and her right shoulder pain following injection in c-spine by Dr Ernestina Patches.  ?Procedure, treatment alternatives, risks and benefits explained, specific risks discussed. Consent was given by the patient. Immediately prior to procedure a time out was called to verify the correct patient, procedure, equipment, support staff and site/side marked as required. Patient was prepped and draped in the usual sterile fashion.  ? ? ? ? ? ?

## 2022-01-20 NOTE — Progress Notes (Signed)
? ?Vanessa Lara - 66 y.o. female MRN [id number]  Date of birth: [birth year] ? ?Office Visit Note: ?Visit Date: 01/18/2022 ?PCP: Pablo Lawrence, NP ?Referred by: Pablo Lawrence, NP ? ?Subjective: ?Chief Complaint  ?Patient presents with  ? Neck - Pain  ? Head - Pain  ? ?HPI:  [name] is a 66 y.o. female who comes in today at the request of Annie Main, PA-C for planned Right C7-T1 Cervical Interlaminar epidural steroid injection with fluoroscopic guidance.  The patient has failed conservative care including home exercise, medications, time and activity modification.  This injection will be diagnostic and hopefully therapeutic.  Please see requesting physician notes for further details and justification. MRI reviewed with images and spine model.  MRI reviewed in the note below.  ? ?Her biggest complaint today however is frontotemporal headache that she reports is benign going for 8 months or more.  I spoke with her briefly about this and she has not been under the care of a neurologist or headache specialist.  I told her I would get a note back to [facility] to maybe get a referral to Dr. Jaynee Eagles at [facility] neurology. ? ?ROS Otherwise per HPI. ? ?Assessment & Plan: ?Visit Diagnoses:  ?  ICD-10-CM   ?1. Cervical radiculopathy  M54.12 XR C-ARM NO REPORT  ?  Epidural Steroid injection  ?  methylPREDNISolone acetate (DEPO-MEDROL) injection 80 mg  ?  ?  ?Plan: No additional findings.  ? ?Meds & Orders:  ?Meds ordered this encounter  ?Medications  ? methylPREDNISolone acetate (DEPO-MEDROL) injection 80 mg  ?  ?Orders Placed This Encounter  ?Procedures  ? XR C-ARM NO REPORT  ? Epidural Steroid injection  ?  ?Follow-up: Return for visit to requesting provider as needed.  ? ?Procedures: ?No procedures performed  ?Cervical Epidural Steroid Injection - Interlaminar Approach with Fluoroscopic Guidance ? ?Patient: Vanessa Lara      ?Date of Birth: [birth year] ?MRN: [id number] ?PCP: Pablo Lawrence, NP      ?Visit Date:  01/18/2022 ?  ?Universal Protocol:    ?Date/Time: 04/21/238:11 AM ? ?Consent Given By: the patient ? ?Position: PRONE ? ?Additional Comments: ?Vital signs were monitored before and after the procedure. ?Patient was prepped and draped in the usual sterile fashion. ?The correct patient, procedure, and site was verified. ? ? ?Injection Procedure Details:  ? ?Procedure diagnoses: Cervical radiculopathy [M54.12]   ? ?Meds Administered:  ?Meds ordered this encounter  ?Medications  ? methylPREDNISolone acetate (DEPO-MEDROL) injection 80 mg  ?  ? ?Laterality: Left ? ?Location/Site: C7-T1 ? ?Needle: 3.5 in., 20 ga. Tuohy ? ?Needle Placement: Paramedian epidural space ? ?Findings: ? -Comments: Excellent flow of contrast into the epidural space. ? ?Procedure Details: ?Using a paramedian approach from the side mentioned above, the region overlying the inferior lamina was localized under fluoroscopic visualization and the soft tissues overlying this structure were infiltrated with 4 ml. of 1% Lidocaine without Epinephrine. A # 20 gauge, Tuohy needle was inserted into the epidural space using a paramedian approach. ? ?The epidural space was localized using loss of resistance along with contralateral oblique bi-planar fluoroscopic views.  After negative aspirate for air, blood, and CSF, a 2 ml. volume of Isovue-250 was injected into the epidural space and the flow of contrast was observed. Radiographs were obtained for documentation purposes.  ? ?The injectate was administered into the level noted above. ? ?Additional Comments:  ?The patient tolerated the procedure well ?Dressing: 2 x 2 sterile gauze and Band-Aid ?  ? ?  Post-procedure details: ?Patient was observed during the procedure. ?Post-procedure instructions were reviewed. ? ?Patient left the clinic in stable condition.   ? ?Clinical History: ?MRI CERVICAL SPINE WITHOUT CONTRAST ?  ?TECHNIQUE: ?Multiplanar, multisequence MR imaging of the cervical spine was ?performed. No  intravenous contrast was administered. ?  ?COMPARISON:  None. ?  ?FINDINGS: ?Alignment: 2 mm retrolisthesis of C5 on C6. ?  ?Vertebrae: No acute fracture, evidence of discitis, or bone lesion. ?  ?Cord: Normal signal and morphology. ?  ?Posterior Fossa, vertebral arteries, paraspinal tissues: Posterior ?fossa demonstrates no focal abnormality. Vertebral artery flow voids ?are maintained. Paraspinal soft tissues are unremarkable. ?  ?Disc levels: ?  ?Discs: Degenerative disease with disc height loss at C4-5 and C5-6. ?  ?C2-3: No significant disc bulge. No neural foraminal stenosis. No ?central canal stenosis. ?  ?C3-4: Mild broad-based disc bulge. No foraminal or central canal ?stenosis. ?  ?C4-5: Mild broad-based disc bulge. No foraminal or central canal ?stenosis. ?  ?C5-6: Mild broad-based disc bulge. Bilateral uncovertebral ?degenerative changes. Moderate left and severe right foraminal ?stenosis. No spinal stenosis. ?  ?C6-7: Mild broad-based disc bulge. No foraminal or central canal ?stenosis. ?  ?C7-T1: No significant disc bulge. No neural foraminal stenosis. No ?central canal stenosis. ?  ?IMPRESSION: ?1. At C5-6 there is a mild broad-based disc bulge. Bilateral ?uncovertebral degenerative changes. Moderate left and severe right ?foraminal stenosis. ?2. Otherwise, mild cervical spine spondylosis as described above. ?  ?  ?Electronically Signed ?  By: Kathreen Devoid M.D. ?  On: 12/16/2021 10:51  ? ? ? ?Objective:  VS:  HT:    WT:   BMI:     BP:132/80  HR:88bpm  TEMP: ( )  RESP:  ?Physical Exam ?Vitals and nursing note reviewed.  ?Constitutional:   ?   General: She is not in acute distress. ?   Appearance: Normal appearance. She is not ill-appearing.  ?HENT:  ?   Head: Normocephalic and atraumatic.  ?   Right Ear: External ear normal.  ?   Left Ear: External ear normal.  ?Eyes:  ?   Extraocular Movements: Extraocular movements intact.  ?Cardiovascular:  ?   Rate and Rhythm: Normal rate.  ?   Pulses: Normal  pulses.  ?Musculoskeletal:  ?   Cervical back: Tenderness present. No rigidity.  ?   Right lower leg: No edema.  ?   Left lower leg: No edema.  ?   Comments: Patient has good strength in the upper extremities including 5 out of 5 strength in wrist extension long finger flexion and APB.  There is no atrophy of the hands intrinsically.  There is a negative Hoffmann's test. ?  ?Lymphadenopathy:  ?   Cervical: No cervical adenopathy.  ?Skin: ?   Findings: No erythema, lesion or rash.  ?Neurological:  ?   General: No focal deficit present.  ?   Mental Status: She is alert and oriented to person, place, and time.  ?   Sensory: No sensory deficit.  ?   Motor: No weakness or abnormal muscle tone.  ?   Coordination: Coordination normal.  ?Psychiatric:     ?   Mood and Affect: Mood normal.     ?   Behavior: Behavior normal.  ?  ? ?Imaging: ?No results found. ?

## 2022-01-20 NOTE — Procedures (Signed)
Cervical Epidural Steroid Injection - Interlaminar Approach with Fluoroscopic Guidance ? ?Patient: Vanessa Lara      ?Date of Birth: 1956/02/18 ?MRN: [id number] ?PCP: Pablo Lawrence, NP      ?Visit Date: 01/18/2022 ?  ?Universal Protocol:    ?Date/Time: 04/21/238:11 AM ? ?Consent Given By: the patient ? ?Position: PRONE ? ?Additional Comments: ?Vital signs were monitored before and after the procedure. ?Patient was prepped and draped in the usual sterile fashion. ?The correct patient, procedure, and site was verified. ? ? ?Injection Procedure Details:  ? ?Procedure diagnoses: Cervical radiculopathy [M54.12]   ? ?Meds Administered:  ?Meds ordered this encounter  ?Medications  ? methylPREDNISolone acetate (DEPO-MEDROL) injection 80 mg  ?  ? ?Laterality: Left ? ?Location/Site: C7-T1 ? ?Needle: 3.5 in., 20 ga. Tuohy ? ?Needle Placement: Paramedian epidural space ? ?Findings: ? -Comments: Excellent flow of contrast into the epidural space. ? ?Procedure Details: ?Using a paramedian approach from the side mentioned above, the region overlying the inferior lamina was localized under fluoroscopic visualization and the soft tissues overlying this structure were infiltrated with 4 ml. of 1% Lidocaine without Epinephrine. A # 20 gauge, Tuohy needle was inserted into the epidural space using a paramedian approach. ? ?The epidural space was localized using loss of resistance along with contralateral oblique bi-planar fluoroscopic views.  After negative aspirate for air, blood, and CSF, a 2 ml. volume of Isovue-250 was injected into the epidural space and the flow of contrast was observed. Radiographs were obtained for documentation purposes.  ? ?The injectate was administered into the level noted above. ? ?Additional Comments:  ?The patient tolerated the procedure well ?Dressing: 2 x 2 sterile gauze and Band-Aid ?  ? ?Post-procedure details: ?Patient was observed during the procedure. ?Post-procedure instructions were  reviewed. ? ?Patient left the clinic in stable condition.  ?

## 2022-01-23 ENCOUNTER — Other Ambulatory Visit (HOSPITAL_COMMUNITY): Payer: Self-pay

## 2022-01-25 ENCOUNTER — Telehealth: Payer: Self-pay | Admitting: Surgical

## 2022-01-25 DIAGNOSIS — M542 Cervicalgia: Secondary | ICD-10-CM

## 2022-01-25 NOTE — Telephone Encounter (Signed)
Pt is calling to see if you have placed a referral for the headache Dr  ?

## 2022-01-25 NOTE — Addendum Note (Signed)
Addended byLaurann Montana on: 01/25/2022 01:20 PM ? ? Modules accepted: Orders ? ?

## 2022-01-25 NOTE — Telephone Encounter (Signed)
I have put in referral. Tried calling patient to advise done. No answer.  ?

## 2022-01-25 NOTE — Telephone Encounter (Signed)
Dr. Ernestina Patches suggested she see Dr. Jaynee Eagles at [facility] neurology which seems reasonable with her continued complaint of headaches.  You mind putting referral in?  Thanks

## 2022-01-30 ENCOUNTER — Ambulatory Visit: Payer: Self-pay

## 2022-01-30 ENCOUNTER — Telehealth: Payer: Self-pay | Admitting: Orthopedic Surgery

## 2022-01-30 ENCOUNTER — Ambulatory Visit (INDEPENDENT_AMBULATORY_CARE_PROVIDER_SITE_OTHER): Payer: Medicare HMO | Admitting: Orthopedic Surgery

## 2022-01-30 ENCOUNTER — Ambulatory Visit (INDEPENDENT_AMBULATORY_CARE_PROVIDER_SITE_OTHER): Payer: Medicare HMO

## 2022-01-30 DIAGNOSIS — M79672 Pain in left foot: Secondary | ICD-10-CM

## 2022-01-30 DIAGNOSIS — M79671 Pain in right foot: Secondary | ICD-10-CM | POA: Diagnosis not present

## 2022-01-30 NOTE — Telephone Encounter (Signed)
Patient called. She would like a call back. Would like to know if Lurena Joiner is sending her to a Dr. For her headaches?  ?

## 2022-01-30 NOTE — Telephone Encounter (Signed)
Tried calling patient to advise per note below that was placed in referral.  No answer. LMVM advising ? ?This is Vanessa Lara at [employer]. Thank you for the referral! I wanted to reach out on this referral. This patient sees [place] for headaches, she sees Dr. Billey Gosling who is one of our headache specialists here--most recently on 12/27/21. There's no need for a referral to be put in for her for the same complaint, advise patient if she is continuing to have headaches she should give our office a call at [phone] and make an appointment for a revisit. I will go ahead and close this referral. ?  ?

## 2022-01-31 ENCOUNTER — Telehealth: Payer: Self-pay

## 2022-01-31 ENCOUNTER — Telehealth: Payer: Self-pay | Admitting: Pharmacist

## 2022-01-31 ENCOUNTER — Other Ambulatory Visit (HOSPITAL_COMMUNITY): Payer: Self-pay

## 2022-01-31 NOTE — Telephone Encounter (Signed)
Received notice from [facility] who dispenses patient's Kevzara. Patient states that between 01/19/22 and 01/31/22, she injected herself three times with Darcus Pester. She stated "mind is not good" and got confused. She was due for an injection on 5/4 and again on 5/18.  She will plan to take next Kevzara dose on 03/02/22 ? ?[facility] will no longer send refills until the week before she is due for dose.  Patient stated she felt great when she spoke with [facility]. ? ?I left VM with patient today to figure out plan to prevent this from recurring. ? ?Knox Saliva, PharmD, MPH, BCPS, CPP ?Clinical Pharmacist (Rheumatology and Pulmonology) ? ?

## 2022-01-31 NOTE — Telephone Encounter (Signed)
Contacted pt back, asked her if she ever start the indomethacin. She said no because her PCP said it would interfere with her Irritable bowel syndrome. I asked her if she ever considered the nerve block and she said she has had steroid injections with Ortho that did help neck pain but she will try a nerve block if it will help. Do you have any other suggestions for her headaches? Please advise  ?

## 2022-01-31 NOTE — Telephone Encounter (Signed)
This patient called in regarding worsening migraines. States she saw a provider d/t a pinched nerve she had, states that pain has resolved, but she is now dealing with a painful migraine. I asked how long this migraine has been occurring and she states for 8 months. ?  ?I went ahead and scheduled her for a sooner follow up with you, but she is asking what she can take in the meantime to relieve the migraine pain.  ? ?Please advise. ?

## 2022-01-31 NOTE — Telephone Encounter (Signed)
I have never encountered this problem before! If this happens again might have to consider a switch to infusion or cimzia for adherence issue.

## 2022-01-31 NOTE — Telephone Encounter (Signed)
Contacted pt, LVM rq call back  

## 2022-01-31 NOTE — Telephone Encounter (Signed)
Let's have her schedule a nerve block with me. She could also try Lyrica daily if she wants to start a daily medication in the meantime. If she's agreeable to starting it I'll send in an rx for her

## 2022-02-01 NOTE — Telephone Encounter (Signed)
Received notification from [facility] yesterday afternoon that patient requested call at 10 am on 02/01/22. ATC patient as advised by patient to review Kevzara injections. ? ?Unable to reach - left VM requesting return call at the clinic and not the pharmacy ? ?Knox Saliva, PharmD, MPH, BCPS, CPP ?Clinical Pharmacist (Rheumatology and Pulmonology) ?

## 2022-02-01 NOTE — Telephone Encounter (Signed)
Yes let's switch her FU to a nerve block appt and then get her scheduled for a FU after that, thanks!

## 2022-02-01 NOTE — Telephone Encounter (Signed)
Contacted pt back, informed her we will switch her FU in June to do a nerve block, she asked if she could try something over the counter to help with her headache in the meantime other than tylenol. I informed her she can try Excedrin migraine.. Pt was very appreciative.  ?

## 2022-02-01 NOTE — Telephone Encounter (Signed)
Pt already has appt scheduled 03/02/22, there is no availability before her appt. Do you want to do the nerve block during that visit? And get her to schedule regular FU after ?  ?

## 2022-02-02 ENCOUNTER — Other Ambulatory Visit (HOSPITAL_COMMUNITY): Payer: Self-pay

## 2022-02-02 NOTE — Progress Notes (Signed)
Office Visit Note  Patient: Vanessa Lara             Date of Birth: 01-23-1956           MRN: [id number]             PCP: Pablo Lawrence, NP Referring: Pablo Lawrence, NP Visit Date: 02/07/2022   Subjective:   History of Present Illness: Vanessa Lara is a 66 y.o. female here for follow up for rheumatoid arthritis with chronic joint pain in multiple areas and chronic fatigue labs showing elevated rheumatoid factor. Kevzara new start on 12/15/2021. She felt an improvement when taking the injections within first weeks. Accidentally took the shots twice in a week due to forgetfulness or mistake but did not notice any major problems. She called in on account of increased joint pain but felt no improvement with the prescribed medrol dose pack. She did feel more lightheadedness or off balance feeling while taking this so stopped after about 3 days. Currently back pain and her left foot pain are worst issues.  Previous HPI 12/05/2021 Vanessa Lara is a 66 y.o. female here for evaluation of rheumatoid arthritis with chronic joint pain in multiple areas and chronic fatigue labs showing elevated rheumatoid factor. She has history of mitral valve prolapse, severe osteoporosis, osteoarthritis, and severe weight loss and deconditioning. She was diagnosed with seropositive RA in 2018 with Dr. Kathlene November and most recently saw Dr. Amil Amen in 11/2020. At that time recommended to start hydroxychloroquine for inflammatory arthritis symptoms. She previously tried methotrexate, simponi aria, and orencia with poor tolerance. Imaging consistent with degenerative arthritis in cervical spine and knee but also with some partial tendon tear in shoulder and some synovitis present in knee on MRI imaging. She has taken multiple medications for pain including NSAIDs, prednisone, gabapentin, tramadol, and hydrocodone or oxycodone. She has a lot of pain in multiple areas but is also severely fatigued. She has significant weight  loss down to 70 lbs unintentionally reports chronic IBS issues but no recent changes during this period of weight loss. Joint pain also limits her activity quite a bit with chronic deformities and also ongoing swelling in bilateral hands. She has also developed left sided headache localized above the temporal area. She has a lot of pain at the base of the skull and occiput without clear radiation of symptoms. She is scheduled to see Dr. Billey Gosling for headache evaluation and management 3/28.   10/2020 ESR 62   05/2017 RF 15 CCP 32 HBV neg HCV neg   Review of Systems  HENT:  Positive for mouth dryness.   Endocrine: Positive for cold intolerance.  Musculoskeletal:  Positive for joint pain, joint pain, joint swelling, muscle weakness, morning stiffness and muscle tenderness.  Neurological:  Positive for dizziness.  Hematological:  Positive for bruising/bleeding tendency.    PMFS History:  Patient Active Problem List   Diagnosis Date Noted   Medication monitoring encounter 12/05/2021   Physical deconditioning 11/11/2021   Elevated serum lactate dehydrogenase (LDH) 08/17/2021   Vitamin D deficiency 08/17/2021   Chronic fatigue 07/25/2021   Vitamin B12 deficiency 07/19/2021   Hardening of the aorta (main artery of the heart) ([place]) 06/21/2021   Solitary lung nodule 06/15/2021   Loss of weight 05/18/2021   Protein-calorie malnutrition, severe ([place]) 05/18/2021   Osteoporosis 06/15/2020   Iron deficiency anemia 04/09/2020   Primary osteoarthritis involving multiple joints 12/31/2018   Rheumatoid arthritis involving multiple sites with positive rheumatoid factor ([place]) 12/31/2018  Irritable bowel syndrome with both constipation and diarrhea 10/28/2018   Constipation 03/14/2016   Mitral valve prolapse 11/23/2015   High cholesterol 11/23/2015    Past Medical History:  Diagnosis Date   Anemia    Anxiety    Arthritis    RA   Cancer (HCC)    Skin   CHF (congestive heart failure) (HCC)     Collagen vascular disease (HCC)    Depression    Endometriosis    High cholesterol    IBS (irritable bowel syndrome)    Lymphadenopathy    Mitral valve prolapse    RA (rheumatoid arthritis) (HCC)    Rotator cuff tear    bilateral   Uterus, adenomyosis     Family History  Problem Relation Age of Onset   Colon cancer Other    Bone cancer Mother    Brain cancer Father    Brain cancer Sister    Breast cancer Sister    Renal Disease Sister    Pancreatic cancer Neg Hx    Esophageal cancer Neg Hx    Stomach cancer Neg Hx    Rectal cancer Neg Hx    Past Surgical History:  Procedure Laterality Date   AXILLARY LYMPH NODE BIOPSY Right 10/12/2020   Procedure: RIGHT AXILLARY LYMPH NODE BIOPSY EXCISION;  Surgeon: Stark Klein, MD;  Location: [facility];  Service: General;  Laterality: Right;   BUNIONECTOMY Right    CARPAL TUNNEL RELEASE Right    COLONOSCOPY     COLONOSCOPY N/A 12/13/2015   Procedure: COLONOSCOPY;  Surgeon: Rogene Houston, MD;  [facility];  Service: Endoscopy;  Laterality: N/A;  9:55   Fatty tumor     2011 (left arm)   FOOT SURGERY     hammertoe   TONSILLECTOMY     TOTAL ABDOMINAL HYSTERECTOMY     2003   Social History   Social History Narrative   Not on file   Immunization History  Administered Date(s) Administered   Influenza, Quadrivalent, Recombinant, Inj, Pf 07/11/2017     Objective: Vital Signs: BP (!) 94/55 (BP Location: Left Arm, Patient Position: Sitting, Cuff Size: Small)   Pulse 78   Resp 12   Ht 5' (1.524 m)   Wt 72 lb (32.7 kg)   BMI 14.06 kg/m    Physical Exam Constitutional:      Comments: Underweight  Eyes:     Conjunctiva/sclera: Conjunctivae normal.  Cardiovascular:     Rate and Rhythm: Normal rate and regular rhythm.  Pulmonary:     Effort: Pulmonary effort is normal.     Breath sounds: Normal breath sounds.  Musculoskeletal:     Right lower leg: No edema.     Left lower leg: No edema.   Skin:    General: Skin is warm and dry.  Neurological:     Mental Status: She is alert.  Psychiatric:        Mood and Affect: Mood normal.     Musculoskeletal Exam:  Shoulders full ROM no tenderness or swelling Elbows full ROM no tenderness or swelling Wrists full ROM no tenderness or swelling Fingers full ROM no tenderness or swelling, MCP joint enlargement and mild swan neck deformities present, no palpable swelling Knees full ROM no tenderness or swelling Ankles full ROM no tenderness or swelling 1st toe lateral deviation with cocked up deformity in 2nd-5th toes tenderness is present no palpable swelling or erythema   CDAI Exam: CDAI Score: 7  Patient Global:  50 mm; Provider Global: 20 mm Swollen: 0 ; Tender: 2  Joint Exam 02/07/2022      Right  Left  MTP 1      Tender  MTP 2      Tender     Investigation: No additional findings.  Imaging: Epidural Steroid injection  Result Date: 01/18/2022 Magnus Sinning, MD     01/20/2022  8:15 AM Cervical Epidural Steroid Injection - Interlaminar Approach with Fluoroscopic Guidance Patient: Vanessa Lara     Date of Birth: 01/25/1956 MRN: [id number] PCP: Pablo Lawrence, NP     Visit Date: 01/18/2022  Universal Protocol:   Date/Time: 04/21/238:11 AM Consent Given By: the patient Position: PRONE Additional Comments: Vital signs were monitored before and after the procedure. Patient was prepped and draped in the usual sterile fashion. The correct patient, procedure, and site was verified. Injection Procedure Details: Procedure diagnoses: Cervical radiculopathy [M54.12]  Meds Administered: Meds ordered this encounter Medications  methylPREDNISolone acetate (DEPO-MEDROL) injection 80 mg  Laterality: Left Location/Site: C7-T1 Needle: 3.5 in., 20 ga. Tuohy Needle Placement: Paramedian epidural space Findings:  -Comments: Excellent flow of contrast into the epidural space. Procedure Details: Using a paramedian approach from the side mentioned  above, the region overlying the inferior lamina was localized under fluoroscopic visualization and the soft tissues overlying this structure were infiltrated with 4 ml. of 1% Lidocaine without Epinephrine. A # 20 gauge, Tuohy needle was inserted into the epidural space using a paramedian approach. The epidural space was localized using loss of resistance along with contralateral oblique bi-planar fluoroscopic views.  After negative aspirate for air, blood, and CSF, a 2 ml. volume of Isovue-250 was injected into the epidural space and the flow of contrast was observed. Radiographs were obtained for documentation purposes. The injectate was administered into the level noted above. Additional Comments: The patient tolerated the procedure well Dressing: 2 x 2 sterile gauze and Band-Aid  Post-procedure details: Patient was observed during the procedure. Post-procedure instructions were reviewed. Patient left the clinic in stable condition.   XR C-ARM NO REPORT  Result Date: 01/18/2022 Please see Notes tab for imaging impression.   Recent Labs: Lab Results  Component Value Date   WBC 9.6 01/05/2022   HGB 12.6 01/05/2022   PLT 320 01/05/2022   NA 136 12/05/2021   K 4.3 12/05/2021   CL 102 12/05/2021   CO2 23 12/05/2021   GLUCOSE 93 12/05/2021   BUN 14 12/05/2021   CREATININE 0.67 12/05/2021   BILITOT 0.2 12/05/2021   ALKPHOS 148 (H) 08/23/2020   AST 11 12/05/2021   ALT 5 (L) 12/05/2021   PROT 7.1 12/05/2021   ALBUMIN 3.6 08/23/2020   CALCIUM 9.4 12/05/2021   GFRAA 92 11/06/2019   QFTBGOLDPLUS NEGATIVE 12/05/2021    Speciality Comments: No specialty comments available.  Procedures:  No procedures performed Allergies: Abatacept, Codeine, Golimumab, Hydrocodone, Methylprednisolone, Methotrexate derivatives, and Prednisone   Assessment / Plan:     Visit Diagnoses: Rheumatoid arthritis involving multiple sites with positive rheumatoid factor ([place]) - Appears to have active seropositive RA  with elevated ESR and multiple swollen joints.  - Plan: Sedimentation rate, Rheumatoid factor  Joint symptoms appear well today on the Cozaar medication.  She has chronic degenerative and RA associated changes and joint contracture unlikely to be affected by anti-inflammatory medication.  We will recheck sedimentation rate for disease activity monitoring.  Plan to continue the Kevzara 200 mg subcu q. 14 days.  High risk medication use - Kevzara 239m  subcutaneously every 14 days.  - Plan: CBC with Differential/Platelet, COMPLETE METABOLIC PANEL WITH GFR, Lipid panel  Checking CBC and CMP for medication monitoring after new Kevzara start.  Will also recheck lipid panel see if there was a marked elevation that her baseline is not too bad.  So far appears to be tolerating medication okay though having some compliance issues due to forgetfulness.  Age-related osteoporosis without current pathological fracture -On forteo treatment for osteoporosis, alendronate less favorable with her ongoing GI issues.  Primary osteoarthritis involving multiple joints - Ongoing followup with orthopedics for specific management several medications for pain currently NSAIDs.    Orders: Orders Placed This Encounter  Procedures   Sedimentation rate   Rheumatoid factor   CBC with Differential/Platelet   COMPLETE METABOLIC PANEL WITH GFR   Lipid panel   No orders of the defined types were placed in this encounter.    Follow-Up Instructions: Return in about 3 months (around 05/10/2022) for RA on KEV f/u 34mo.   CCollier Salina MD  Note - This record has been created using [employer]  Chart creation errors have been sought, but may not always  have been located. Such creation errors do not reflect on  the standard of medical care.

## 2022-02-02 NOTE — Telephone Encounter (Signed)
Received message from pharmacist at [facility] stating that patient called pharmacy. Patient states she is in a lot of pain and is requesting another injection. She was due to take today but took [place] twice over the course of two weeks. Patient provided different information today than she discussed with pharmacy two days ago. She does not even recall when her last injection was (states about 2 weeks ago).  ? ?She has pain diffuse through body. ? ?She states she'd like final decision to be by Dr. Benjamine Mola on if she can take Kevzara injection. ? ?She has appt with Dr. Benjamine Mola on 02/07/22 and does not want to wait until appt for some relief. ? ?Knox Saliva, PharmD, MPH, BCPS, CPP ?Clinical Pharmacist (Rheumatology and Pulmonology) ?

## 2022-02-02 NOTE — Telephone Encounter (Signed)
Pt called complaining of the same migraine.  ?States it is getting worse.  ?States she is needing any medication to help with the pain weather prescribed or over the counter. Excedrin migraine did not work at all.  ?

## 2022-02-03 ENCOUNTER — Telehealth: Payer: Self-pay

## 2022-02-03 ENCOUNTER — Other Ambulatory Visit (HOSPITAL_COMMUNITY): Payer: Self-pay

## 2022-02-03 DIAGNOSIS — M0579 Rheumatoid arthritis with rheumatoid factor of multiple sites without organ or systems involvement: Secondary | ICD-10-CM

## 2022-02-03 MED ORDER — METHYLPREDNISOLONE 4 MG PO TBPK: ORAL_TABLET | ORAL | 0 refills | Status: DC

## 2022-02-03 NOTE — Telephone Encounter (Signed)
Patient called stating she left a message with the pharmacist yesterday regarding her Kevzara injection.  Patient states she is in a lot of pain and needs another injection before her appointment on 02/07/22.  Patient states she accidentally took too many injections and is now out of medication.  Patient requested a return call ASAP.   ? ?

## 2022-02-03 NOTE — Telephone Encounter (Addendum)
Pharmacy will not be able to fill medication as refill is too soon now and she has used up her supply at home. Pharmacy can only fill on or after 02/13/22 per test claim. We currently do not have Kevzara samples in clinic to provide for her. ? ?Darcus Pester will not provide immediate pain relief either. Based on my conversation with her, she had pain yesterday when she was due for [facility]. It seems to me like Darcus Pester may not be working well for her. ? ?Routing again to Dr. Benjamine Mola for advice for her. She has appt on 02/07/22 ? ?Knox Saliva, PharmD, MPH, BCPS, CPP ?Clinical Pharmacist (Rheumatology and Pulmonology) ?

## 2022-02-03 NOTE — Telephone Encounter (Signed)
FYI- I spoke with [name] she is having pain all over despite excess doses of medication. We have follow up early next week for short term recommend oral steroids. She reports cannot tolerate oral prednisone due to rashes so Rx for medrol taper sent today.

## 2022-02-06 ENCOUNTER — Other Ambulatory Visit: Payer: Self-pay | Admitting: Psychiatry

## 2022-02-06 MED ORDER — PREGABALIN 50 MG PO CAPS: ORAL_CAPSULE | ORAL | 3 refills | Status: DC

## 2022-02-06 NOTE — Telephone Encounter (Signed)
She should stop gabapentin and can start Lyrica 50 mg at bedtime. She can take this for one week, then if no side effects can increase to 50 mg twice a day. I sent an rx to her pharmacy

## 2022-02-06 NOTE — Telephone Encounter (Signed)
She can first take an extra 100 mg of gabapentin at night and see if that helps. If this makes her too drowsy we can consider switching her gabapentin to lyrica

## 2022-02-06 NOTE — Telephone Encounter (Signed)
Called patient and informed her of Dr Georgina Peer advice. She stated gabapentin already makes her really dizzy. She wants to try lyrica instead of increasing gabapentin. I advised will let MD know. Patient verbalized understanding, appreciation. ?.  ?

## 2022-02-06 NOTE — Telephone Encounter (Signed)
Called patient and reviewed Dr Georgina Peer message and new Rx with her. She repeated correctly, verbalized understanding, appreciation. ? ?

## 2022-02-07 ENCOUNTER — Encounter: Payer: Self-pay | Admitting: Internal Medicine

## 2022-02-07 ENCOUNTER — Ambulatory Visit (INDEPENDENT_AMBULATORY_CARE_PROVIDER_SITE_OTHER): Payer: Medicare HMO | Admitting: Internal Medicine

## 2022-02-07 VITALS — BP 94/55 | HR 78 | Resp 12 | Ht 60.0 in | Wt 72.0 lb

## 2022-02-07 DIAGNOSIS — Z79899 Other long term (current) drug therapy: Secondary | ICD-10-CM

## 2022-02-07 DIAGNOSIS — M0579 Rheumatoid arthritis with rheumatoid factor of multiple sites without organ or systems involvement: Secondary | ICD-10-CM

## 2022-02-07 DIAGNOSIS — M810 Age-related osteoporosis without current pathological fracture: Secondary | ICD-10-CM

## 2022-02-07 DIAGNOSIS — K582 Mixed irritable bowel syndrome: Secondary | ICD-10-CM

## 2022-02-07 DIAGNOSIS — R5382 Chronic fatigue, unspecified: Secondary | ICD-10-CM

## 2022-02-07 DIAGNOSIS — M159 Polyosteoarthritis, unspecified: Secondary | ICD-10-CM | POA: Diagnosis not present

## 2022-02-08 ENCOUNTER — Telehealth: Payer: Self-pay | Admitting: Internal Medicine

## 2022-02-08 ENCOUNTER — Telehealth: Payer: Self-pay | Admitting: *Deleted

## 2022-02-08 LAB — CBC WITH DIFFERENTIAL/PLATELET
Absolute Monocytes: 579 cells/uL (ref 200–950)
Basophils Absolute: 89 cells/uL (ref 0–200)
Basophils Relative: 1 %
Eosinophils Absolute: 214 cells/uL (ref 15–500)
Eosinophils Relative: 2.4 %
HCT: 40.9 % (ref 35.0–45.0)
Hemoglobin: 13.3 g/dL (ref 11.7–15.5)
Lymphs Abs: 2875 cells/uL (ref 850–3900)
MCH: 28.7 pg (ref 27.0–33.0)
MCHC: 32.5 g/dL (ref 32.0–36.0)
MCV: 88.1 fL (ref 80.0–100.0)
MPV: 11 fL (ref 7.5–12.5)
Monocytes Relative: 6.5 %
Neutro Abs: 5144 cells/uL (ref 1500–7800)
Neutrophils Relative %: 57.8 %
Platelets: 236 10*3/uL (ref 140–400)
RBC: 4.64 10*6/uL (ref 3.80–5.10)
RDW: 14.5 % (ref 11.0–15.0)
Total Lymphocyte: 32.3 %
WBC: 8.9 10*3/uL (ref 3.8–10.8)

## 2022-02-08 LAB — COMPLETE METABOLIC PANEL WITH GFR
AG Ratio: 2.2 (calc) (ref 1.0–2.5)
ALT: 6 U/L (ref 6–29)
AST: 13 U/L (ref 10–35)
Albumin: 4.3 g/dL (ref 3.6–5.1)
Alkaline phosphatase (APISO): 71 U/L (ref 37–153)
BUN: 15 mg/dL (ref 7–25)
CO2: 29 mmol/L (ref 20–32)
Calcium: 9.8 mg/dL (ref 8.6–10.4)
Chloride: 102 mmol/L (ref 98–110)
Creat: 0.67 mg/dL (ref 0.50–1.05)
Globulin: 2 g/dL (calc) (ref 1.9–3.7)
Glucose, Bld: 62 mg/dL — ABNORMAL LOW (ref 65–99)
Potassium: 3.6 mmol/L (ref 3.5–5.3)
Sodium: 137 mmol/L (ref 135–146)
Total Bilirubin: 0.3 mg/dL (ref 0.2–1.2)
Total Protein: 6.3 g/dL (ref 6.1–8.1)
eGFR: 97 mL/min/{1.73_m2} (ref >=60)

## 2022-02-08 LAB — LIPID PANEL
Cholesterol: 210 mg/dL — ABNORMAL HIGH (ref <200)
HDL: 44 mg/dL — ABNORMAL LOW (ref >=50)
LDL Cholesterol (Calc): 127 mg/dL (calc) — ABNORMAL HIGH
Non-HDL Cholesterol (Calc): 166 mg/dL (calc) — ABNORMAL HIGH (ref <130)
Total CHOL/HDL Ratio: 4.8 (calc) (ref <5.0)
Triglycerides: 251 mg/dL — ABNORMAL HIGH (ref <150)

## 2022-02-08 LAB — RHEUMATOID FACTOR: Rheumatoid fact SerPl-aCnc: 48 IU/mL — ABNORMAL HIGH (ref <14)

## 2022-02-08 LAB — SEDIMENTATION RATE: Sed Rate: 6 mm/h (ref 0–30)

## 2022-02-08 NOTE — Telephone Encounter (Signed)
Patient called the office requesting a call back about her labs.  ?

## 2022-02-08 NOTE — Progress Notes (Signed)
Labs look okay for continuing the actemra she can resume this. Her cholesterol numbers are mildly increased, but not enough that it is a problem for continuing treatment. Sedimentation rate test is now completely normal. Her rheumatoid factor test is mildly increased to 48 this was 24-32 before.

## 2022-02-08 NOTE — Telephone Encounter (Signed)
LMOM for patient to call office to discuss meds. ?

## 2022-02-08 NOTE — Telephone Encounter (Signed)
See lab note for details.  

## 2022-02-08 NOTE — Telephone Encounter (Signed)
Patient advised Dr. Benjamine Mola does not think we need to get her a sample of the kevzara. With her taking an excess dose waiting a few extra days to repeat would be a safe option.  ?Her inflammatory markers looking normal and the lack of response to prednisone we tried for a few days suggests that her current pain is not from RA. Osteoarthritis pain would not get any better with the earlier kevzara.  ?

## 2022-02-08 NOTE — Telephone Encounter (Signed)
Patient contacted the office stating she is unable to get her Darcus Pester refilled until Monday. Patient is anxious to restart the [facility] as she held it due to giving herself to much medication in one week. Patient would like to know if we can provide her a sample. Please advise.  ?

## 2022-02-08 NOTE — Telephone Encounter (Signed)
Patient called the office stating she had questions regarding her Vanessa Lara and requests a call back from [facility].  ?

## 2022-02-09 NOTE — Telephone Encounter (Signed)
Spoke with patient - advised that I have already emailed [facility] to ship her Darcus Pester as soon as they can process it through the insurance. She requested override be placed by pharmacy so she can get it earlier ,but I advised that Dr. Marveen Reeks recommendation was that she wait until next week to take [facility] again. She verbalized understanding. ? ?Knox Saliva, PharmD, MPH, BCPS, CPP ?Clinical Pharmacist (Rheumatology and Pulmonology) ?

## 2022-02-16 ENCOUNTER — Other Ambulatory Visit (HOSPITAL_COMMUNITY): Payer: Self-pay

## 2022-02-17 ENCOUNTER — Other Ambulatory Visit (HOSPITAL_COMMUNITY): Payer: Self-pay

## 2022-02-27 ENCOUNTER — Encounter: Payer: Self-pay | Admitting: Orthopedic Surgery

## 2022-02-27 NOTE — Progress Notes (Signed)
Office Visit Note   Patient: Vanessa Lara           Date of Birth: 1955-12-11           MRN: [id number] Visit Date: 01/30/2022              Requested by: Donella Stade, PA-C [place],  [place] [address] PCP: Pablo Lawrence, NP  Chief Complaint  Patient presents with   Right Foot - Pain   Left Foot - Pain      HPI: Patient is a 66 year old woman who presents complaining of bilateral foot pain.  Patient states that she has had hammertoe and bunion surgery on the right with podiatry.  Patient states she has had progressive bunion deformity of the right great toe with valgus deformity of the lesser toes and great toe.  Patient states that the toes on both feet are pointing the wrong way.  Assessment & Plan: Visit Diagnoses:  1. Bilateral foot pain     Plan: Discussed with the patient we could proceed with surgical intervention.  Discussed that with her multiple medications we would have to adjust medications prior to surgery.  She would have to be off her fentanyl patch prior to surgery we would also discontinue her Plavix prior to surgery.  She is currently on a disease modifying antirheumatic drug and most likely we could continue this through surgery.  Patient will call when she is off the fentanyl patch we will follow-up repeat radiographs and review surgical recommendations.  Follow-Up Instructions: No follow-ups on file.   Ortho Exam  Patient is alert, oriented, no adenopathy, well-dressed, normal affect, normal respiratory effort. Examination patient has a palpable dorsalis pedis pulse bilaterally.  She has fixed clawing of the toes on both feet with bunion deformity of the great toe bilaterally worse on the right than the left.  There is no increased intermetatarsal angle of the first and second metatarsals on the left foot.  Right foot does show an increased angle in the first and second metatarsal angle with dislocation of the sesamoids dislocation of  the proximal phalanx at the MTP joint and dislocation of the lesser toes at the MTP joint.  There is no redness no cellulitis no signs of infection.  Imaging: No results found. No images are attached to the encounter.  Labs: Lab Results  Component Value Date   HGBA1C 5.7 (H) 03/19/2020   HGBA1C 5.5 11/06/2019   ESRSEDRATE 6 02/07/2022   ESRSEDRATE 63 (H) 12/05/2021   ESRSEDRATE 62 (H) 08/23/2020   CRP 32.9 (H) 12/05/2021   CRP 1.7 (H) 08/23/2020   CRP 2.0 05/15/2019     Lab Results  Component Value Date   ALBUMIN 3.6 08/23/2020    No results found for: MG Lab Results  Component Value Date   VD25OH 59.47 01/05/2022   VD25OH 33.7 05/27/2021   VD25OH 56.60 04/09/2020    No results found for: PREALBUMIN    Latest Ref Rng & Units 02/07/2022    1:39 PM 01/05/2022    1:35 PM 12/05/2021   10:52 AM  CBC EXTENDED  WBC 3.8 - 10.8 Thousand/uL 8.9   9.6   17.7    RBC 3.80 - 5.10 Million/uL 4.64   4.47   3.88    Hemoglobin 11.7 - 15.5 g/dL 13.3   12.6   11.0    HCT 35.0 - 45.0 % 40.9   40.0   34.1    Platelets 140 - 400  Thousand/uL 236   320   545    NEUT# 1,500 - 7,800 cells/uL 5,144   5.3   15,682    Lymph# 850 - 3,900 cells/uL 2,875   3.3   1,221       There is no height or weight on file to calculate BMI.  Orders:  Orders Placed This Encounter  Procedures   XR Foot 2 Views Right   XR Foot 2 Views Left   No orders of the defined types were placed in this encounter.    Procedures: No procedures performed  Clinical Data: No additional findings.  ROS:  All other systems negative, except as noted in the HPI. Review of Systems  Objective: Vital Signs: There were no vitals taken for this visit.  Specialty Comments:  MRI CERVICAL SPINE WITHOUT CONTRAST   TECHNIQUE: Multiplanar, multisequence MR imaging of the cervical spine was performed. No intravenous contrast was administered.   COMPARISON:  None.   FINDINGS: Alignment: 2 mm retrolisthesis of C5 on  C6.   Vertebrae: No acute fracture, evidence of discitis, or bone lesion.   Cord: Normal signal and morphology.   Posterior Fossa, vertebral arteries, paraspinal tissues: Posterior fossa demonstrates no focal abnormality. Vertebral artery flow voids are maintained. Paraspinal soft tissues are unremarkable.   Disc levels:   Discs: Degenerative disease with disc height loss at C4-5 and C5-6.   C2-3: No significant disc bulge. No neural foraminal stenosis. No central canal stenosis.   C3-4: Mild broad-based disc bulge. No foraminal or central canal stenosis.   C4-5: Mild broad-based disc bulge. No foraminal or central canal stenosis.   C5-6: Mild broad-based disc bulge. Bilateral uncovertebral degenerative changes. Moderate left and severe right foraminal stenosis. No spinal stenosis.   C6-7: Mild broad-based disc bulge. No foraminal or central canal stenosis.   C7-T1: No significant disc bulge. No neural foraminal stenosis. No central canal stenosis.   IMPRESSION: 1. At C5-6 there is a mild broad-based disc bulge. Bilateral uncovertebral degenerative changes. Moderate left and severe right foraminal stenosis. 2. Otherwise, mild cervical spine spondylosis as described above.     Electronically Signed   By: Kathreen Devoid M.D.   On: 12/16/2021 10:51  PMFS History: Patient Active Problem List   Diagnosis Date Noted   Medication monitoring encounter 12/05/2021   Physical deconditioning 11/11/2021   Elevated serum lactate dehydrogenase (LDH) 08/17/2021   Vitamin D deficiency 08/17/2021   Chronic fatigue 07/25/2021   Vitamin B12 deficiency 07/19/2021   Hardening of the aorta (main artery of the heart) ([place]) 06/21/2021   Solitary lung nodule 06/15/2021   Loss of weight 05/18/2021   Protein-calorie malnutrition, severe ([place]) 05/18/2021   Osteoporosis 06/15/2020   Iron deficiency anemia 04/09/2020   Primary osteoarthritis involving multiple joints 12/31/2018    Rheumatoid arthritis involving multiple sites with positive rheumatoid factor ([place]) 12/31/2018   Irritable bowel syndrome with both constipation and diarrhea 10/28/2018   Constipation 03/14/2016   Mitral valve prolapse 11/23/2015   High cholesterol 11/23/2015   Past Medical History:  Diagnosis Date   Anemia    Anxiety    Arthritis    RA   Cancer (HCC)    Skin   CHF (congestive heart failure) (HCC)    Collagen vascular disease (HCC)    Depression    Endometriosis    High cholesterol    IBS (irritable bowel syndrome)    Lymphadenopathy    Mitral valve prolapse    RA (rheumatoid arthritis) ([place])  Rotator cuff tear    bilateral   Uterus, adenomyosis     Family History  Problem Relation Age of Onset   Colon cancer Other    Bone cancer Mother    Brain cancer Father    Brain cancer Sister    Breast cancer Sister    Renal Disease Sister    Pancreatic cancer Neg Hx    Esophageal cancer Neg Hx    Stomach cancer Neg Hx    Rectal cancer Neg Hx     Past Surgical History:  Procedure Laterality Date   AXILLARY LYMPH NODE BIOPSY Right 10/12/2020   Procedure: RIGHT AXILLARY LYMPH NODE BIOPSY EXCISION;  Surgeon: Stark Klein, MD;  Location: [facility];  Service: General;  Laterality: Right;   BUNIONECTOMY Right    CARPAL TUNNEL RELEASE Right    COLONOSCOPY     COLONOSCOPY N/A 12/13/2015   Procedure: COLONOSCOPY;  Surgeon: Rogene Houston, MD;  [facility];  Service: Endoscopy;  Laterality: N/A;  9:55   Fatty tumor     2011 (left arm)   FOOT SURGERY     hammertoe   TONSILLECTOMY     TOTAL ABDOMINAL HYSTERECTOMY     2003   Social History   Occupational History   Occupation: retired  Tobacco Use   Smoking status: Some Days    Packs/day: 0.50    Years: 33.00    Pack years: 16.50    Types: Cigarettes    Last attempt to quit: 2013    Years since quitting: 10.4   Smokeless tobacco: Never  Vaping Use   Vaping Use: Never used  Substance and  Sexual Activity   Alcohol use: No    Alcohol/week: 0.0 standard drinks   Drug use: No   Sexual activity: Not Currently

## 2022-03-02 ENCOUNTER — Telehealth: Payer: Self-pay | Admitting: Psychiatry

## 2022-03-02 ENCOUNTER — Ambulatory Visit (INDEPENDENT_AMBULATORY_CARE_PROVIDER_SITE_OTHER): Payer: Medicare HMO | Admitting: Psychiatry

## 2022-03-02 VITALS — BP 108/63 | HR 105

## 2022-03-02 DIAGNOSIS — M5481 Occipital neuralgia: Secondary | ICD-10-CM

## 2022-03-02 DIAGNOSIS — M542 Cervicalgia: Secondary | ICD-10-CM | POA: Diagnosis not present

## 2022-03-02 NOTE — Telephone Encounter (Signed)
I spoke with Dr. Billey Gosling verbally on this she would like to give the steriod injection just a little bit longer to work. The Steriod component of the nerve block can take between 24-48 hours to take effect. I called the pt back and relayed this information she verbalized understanding and appreciation.  She will call on Monday to update [place] on how she is doing.

## 2022-03-02 NOTE — Telephone Encounter (Signed)
Pt said, migraine has come back and in full force, level of pain 10+. Tried heat on neck and forehead and is not helping. Would like a call from the nurse.

## 2022-03-02 NOTE — Progress Notes (Signed)
Procedure: Occipital Nerve injection/Trigger point injection  Location: bilateral occiput  The risks, benefits and anticipated outcomes of the procedure, the risks and benefits of the alternatives to the procedure, and the roles and tasks of the personnel to be involved, were discussed with the patient, and the patient consents to the procedure and agrees to proceed.     A combination of 1 cc betamethasone 6 mg and 4 cc of 0.25% bupivacaine were prepared in 2 syringes (5 cc).  2 trigger points on the splenius capitus were identified and injected. The left and right greater occipital nerves were injected 3cm caudal and 1.5 cm lateral to the inion where the main trunk of the occipital nerve penetrates the semispinalis muscle.  The needle was placed perpendicular and the needle advanced 1.5 cm. After aspiration to ensure no obstruction or presence of blood, the area was injected.  The needle was repositioned in a fan-like manner and the entire area was injected. Pressure was held and no hematoma was noted.   Genia Harold, MD 03/02/22 10:44 AM

## 2022-03-07 ENCOUNTER — Other Ambulatory Visit (HOSPITAL_COMMUNITY): Payer: Self-pay

## 2022-03-08 ENCOUNTER — Other Ambulatory Visit (HOSPITAL_COMMUNITY): Payer: Self-pay

## 2022-03-15 ENCOUNTER — Other Ambulatory Visit (HOSPITAL_COMMUNITY): Payer: Self-pay

## 2022-03-15 DIAGNOSIS — G4701 Insomnia due to medical condition: Secondary | ICD-10-CM | POA: Insufficient documentation

## 2022-03-15 MED ORDER — FORTEO 600 MCG/2.4ML ~~LOC~~ SOPN
PEN_INJECTOR | SUBCUTANEOUS | 5 refills | Status: DC
  Filled 2022-03-15: qty 2.4, 28d supply, fill #0
  Filled 2022-04-11: qty 2.4, 28d supply, fill #1
  Filled 2022-05-03: qty 2.4, 28d supply, fill #2
  Filled 2022-05-30: qty 2.4, 28d supply, fill #3
  Filled 2022-06-29: qty 2.4, 28d supply, fill #4
  Filled 2022-07-27: qty 2.4, 28d supply, fill #5

## 2022-03-16 ENCOUNTER — Other Ambulatory Visit (HOSPITAL_COMMUNITY): Payer: Self-pay

## 2022-03-21 ENCOUNTER — Other Ambulatory Visit (HOSPITAL_COMMUNITY): Payer: Self-pay

## 2022-03-22 ENCOUNTER — Other Ambulatory Visit (HOSPITAL_COMMUNITY): Payer: Self-pay

## 2022-03-22 ENCOUNTER — Telehealth: Payer: Self-pay

## 2022-03-22 NOTE — Telephone Encounter (Signed)
VOB submitted for Monovisc, left knee. BV pending

## 2022-03-23 ENCOUNTER — Other Ambulatory Visit (HOSPITAL_COMMUNITY): Payer: Self-pay

## 2022-03-24 ENCOUNTER — Encounter: Payer: Self-pay | Admitting: Orthopedic Surgery

## 2022-03-24 ENCOUNTER — Ambulatory Visit (INDEPENDENT_AMBULATORY_CARE_PROVIDER_SITE_OTHER): Payer: Medicare HMO | Admitting: Surgical

## 2022-03-24 DIAGNOSIS — M1712 Unilateral primary osteoarthritis, left knee: Secondary | ICD-10-CM | POA: Diagnosis not present

## 2022-03-24 MED ORDER — LIDOCAINE HCL 1 % IJ SOLN
5.0000 mL | INTRAMUSCULAR | Status: AC | PRN
  Administered 2022-03-24: 5 mL

## 2022-03-24 MED ORDER — HYALURONAN 88 MG/4ML IX SOSY
88.0000 mg | PREFILLED_SYRINGE | INTRA_ARTICULAR | Status: AC | PRN
  Administered 2022-03-24: 88 mg via INTRA_ARTICULAR

## 2022-03-29 ENCOUNTER — Encounter: Payer: Self-pay | Admitting: Orthopedic Surgery

## 2022-03-29 ENCOUNTER — Ambulatory Visit (INDEPENDENT_AMBULATORY_CARE_PROVIDER_SITE_OTHER): Payer: Medicare HMO | Admitting: Surgical

## 2022-03-29 ENCOUNTER — Telehealth: Payer: Self-pay

## 2022-03-29 VITALS — Wt 74.6 lb

## 2022-03-29 DIAGNOSIS — M1711 Unilateral primary osteoarthritis, right knee: Secondary | ICD-10-CM | POA: Diagnosis not present

## 2022-03-29 MED ORDER — METHYLPREDNISOLONE ACETATE 40 MG/ML IJ SUSP
40.0000 mg | INTRAMUSCULAR | Status: AC | PRN
  Administered 2022-03-29: 40 mg via INTRA_ARTICULAR

## 2022-03-29 MED ORDER — "BD SYRINGE SLIP TIP 25G X 5/8"" 1 ML MISC": 0 refills | Status: DC

## 2022-03-29 MED ORDER — BUPIVACAINE HCL 0.25 % IJ SOLN
4.0000 mL | INTRAMUSCULAR | Status: AC | PRN
  Administered 2022-03-29: 4 mL via INTRA_ARTICULAR

## 2022-03-29 MED ORDER — LIDOCAINE HCL 1 % IJ SOLN
5.0000 mL | INTRAMUSCULAR | Status: AC | PRN
  Administered 2022-03-29: 5 mL

## 2022-03-29 NOTE — Telephone Encounter (Signed)
Auth needed for right knee gel injection last done April 2023

## 2022-03-29 NOTE — Telephone Encounter (Signed)
Noted  

## 2022-03-29 NOTE — Telephone Encounter (Signed)
VOB submitted for Monovisc, right knee BV pending

## 2022-03-29 NOTE — Progress Notes (Signed)
   Procedure Note  Patient: Vanessa Lara             Date of Birth: May 15, 1956           MRN: [id number]             Visit Date: 03/29/2022  Procedures: Visit Diagnoses:  1. Primary osteoarthritis of right knee     Large Joint Inj: R knee on 03/29/2022 11:53 AM Indications: diagnostic evaluation, joint swelling and pain Details: 22 G 1.5 in needle, superolateral approach  Arthrogram: No  Medications: 5 mL lidocaine 1 %; 40 mg methylPREDNISolone acetate 40 MG/ML; 4 mL bupivacaine 0.25 % Outcome: tolerated well, no immediate complications  Patient tolerated the procedure well.  Follow-up in 6 months for repeat gel injections which have given her relief.  This patient is diagnosed with osteoarthritis of the knee(s).    Radiographs show evidence of joint space narrowing, osteophytes, subchondral sclerosis and/or subchondral cysts.  This patient has knee pain which interferes with functional and activities of daily living.    This patient has experienced inadequate response, adverse effects and/or intolerance with conservative treatments such as acetaminophen, NSAIDS, topical creams, physical therapy or regular exercise, knee bracing and/or weight loss.   This patient has experienced inadequate response or has a contraindication to intra articular steroid injections for at least 3 months.   This patient is not scheduled to have a total knee replacement within 6 months of starting treatment with viscosupplementation.   Procedure, treatment alternatives, risks and benefits explained, specific risks discussed. Consent was given by the patient. Immediately prior to procedure a time out was called to verify the correct patient, procedure, equipment, support staff and site/side marked as required. Patient was prepped and draped in the usual sterile fashion.

## 2022-03-31 ENCOUNTER — Other Ambulatory Visit: Payer: Self-pay | Admitting: Gastroenterology

## 2022-03-31 ENCOUNTER — Other Ambulatory Visit: Payer: Self-pay

## 2022-03-31 DIAGNOSIS — M1712 Unilateral primary osteoarthritis, left knee: Secondary | ICD-10-CM

## 2022-03-31 DIAGNOSIS — R109 Unspecified abdominal pain: Secondary | ICD-10-CM

## 2022-04-03 ENCOUNTER — Ambulatory Visit: Payer: Medicare HMO | Admitting: Orthopedic Surgery

## 2022-04-05 ENCOUNTER — Other Ambulatory Visit (HOSPITAL_COMMUNITY): Payer: Self-pay

## 2022-04-06 ENCOUNTER — Other Ambulatory Visit (HOSPITAL_COMMUNITY): Payer: Self-pay

## 2022-04-07 ENCOUNTER — Ambulatory Visit: Payer: Medicare HMO | Admitting: Family

## 2022-04-07 ENCOUNTER — Other Ambulatory Visit: Payer: Self-pay | Admitting: Internal Medicine

## 2022-04-07 ENCOUNTER — Other Ambulatory Visit (HOSPITAL_COMMUNITY): Payer: Self-pay

## 2022-04-07 DIAGNOSIS — M0579 Rheumatoid arthritis with rheumatoid factor of multiple sites without organ or systems involvement: Secondary | ICD-10-CM

## 2022-04-07 MED ORDER — SAFETY PEN NEEDLES 30G X 8 MM MISC
1 refills | Status: DC
  Filled 2022-04-07: qty 100, 90d supply, fill #0
  Filled 2022-05-18: qty 100, 100d supply, fill #0
  Filled 2022-07-27 – 2022-08-17 (×2): qty 100, 100d supply, fill #1

## 2022-04-07 NOTE — Telephone Encounter (Signed)
Next Visit: 05/10/2022  Last Visit: 02/07/2022  Last Fill: 12/15/2021  [id number] arthritis involving multiple sites with positive rheumatoid   Current Dose per office note 02/07/2022: Darcus Pester '200mg'$  subcutaneously every 14 days  Labs: 02/08/2022 labs look okay for continuing the Kevzara  TB Gold: 12/05/2021 Neg    Okay to refill Kevzara?

## 2022-04-10 ENCOUNTER — Other Ambulatory Visit (HOSPITAL_COMMUNITY): Payer: Self-pay

## 2022-04-10 ENCOUNTER — Telehealth: Payer: Self-pay | Admitting: Internal Medicine

## 2022-04-10 NOTE — Telephone Encounter (Signed)
Patient called stating for the past 10 days experiencing extreme fatigue.  Patient states she does not have any other symptoms and it came out of nowhere.  Patient is not sure if this is a side effect of the Kevzara medication.  Patient states she has an appointment scheduled with her PCP on 04/19/22, but wanted to talk with Dr. Marveen Reeks nurse first.

## 2022-04-10 NOTE — Telephone Encounter (Signed)
Returned call to patient. She states she has not been feeling well for 10 days. Patient states she does not have an infection or pain in her joints. Patient states she has extreme fatigue. Patient states she can barely get up, unable to stay up for more than 30 minutes and is having to force herself to eat. Patient advised it is not likely from the [place]. Patient states this came on all of a sudden. Patient advised to reach out to her PCP to see if they may be able to get her in sooner for an evaluation. Patient expressed understanding.

## 2022-04-11 ENCOUNTER — Other Ambulatory Visit (HOSPITAL_COMMUNITY): Payer: Self-pay

## 2022-04-11 MED ORDER — KEVZARA 200 MG/1.14ML ~~LOC~~ SOAJ
200.0000 mg | SUBCUTANEOUS | 3 refills | Status: DC
  Filled 2022-04-11: qty 2.28, 28d supply, fill #0

## 2022-04-12 ENCOUNTER — Other Ambulatory Visit (HOSPITAL_COMMUNITY): Payer: Self-pay

## 2022-04-13 ENCOUNTER — Telehealth: Payer: Self-pay | Admitting: Internal Medicine

## 2022-04-13 NOTE — Telephone Encounter (Signed)
Patient called to let Dr. Marveen Reeks nurse know that she called her PCP and cannot be seen until September.  Patient states she is very sick and needs to be seen.  Patient states "there is something wrong and no one will listen to her."  Patient requested a return call ASAP.

## 2022-04-14 NOTE — Telephone Encounter (Signed)
It's hard to say from her report if anything is related to the [place]. I agree talking to her PCP office may be the most beneficial. If she is strongly concerned about the medication we could potentially check her CBC and CMP to rule out any problem like anemia or a metabolic problem. Or just hold the medication for now.

## 2022-04-14 NOTE — Telephone Encounter (Signed)
Patient advised it's hard to say from her report if anything is related to the [facility]. Dr. Benjamine Mola agrees talking to her PCP office may be the most beneficial. If she is strongly concerned about the medication we could potentially check her CBC and CMP to rule out any problem like anemia or a metabolic problem. Or just hold the medication for now. Patient states she has an appointment scheduled for 04/17/2022 and will have them run the labs. Patient will have copy sent to our office.

## 2022-04-17 ENCOUNTER — Other Ambulatory Visit (HOSPITAL_COMMUNITY): Payer: Self-pay

## 2022-04-17 MED ORDER — INSULIN PEN NEEDLE 30G X 8 MM MISC
1 refills | Status: DC
  Filled 2022-04-17: qty 100, 90d supply, fill #0

## 2022-04-18 ENCOUNTER — Other Ambulatory Visit (HOSPITAL_COMMUNITY): Payer: Self-pay

## 2022-04-19 ENCOUNTER — Telehealth: Payer: Self-pay | Admitting: *Deleted

## 2022-04-19 ENCOUNTER — Other Ambulatory Visit (HOSPITAL_COMMUNITY): Payer: Self-pay

## 2022-04-19 ENCOUNTER — Ambulatory Visit: Payer: Medicare HMO | Admitting: Family

## 2022-04-19 DIAGNOSIS — M0579 Rheumatoid arthritis with rheumatoid factor of multiple sites without organ or systems involvement: Secondary | ICD-10-CM

## 2022-04-19 DIAGNOSIS — Z79899 Other long term (current) drug therapy: Secondary | ICD-10-CM

## 2022-04-19 NOTE — Telephone Encounter (Signed)
Patient's PCP, Pablo Lawrence, NP, contacted the office stating she saw the patient. She stated that she was doing a metabolic work-up. She states patient may have a possible UTI and will be given prophylactic antibiotics. Loma Sousa would like to know your input on possibly lowering Kevzara to 150 mg for a month to see if patient improves.

## 2022-04-21 ENCOUNTER — Other Ambulatory Visit (HOSPITAL_COMMUNITY): Payer: Self-pay

## 2022-04-21 NOTE — Telephone Encounter (Signed)
Called patient regarding Dr. Benjamine Mola and PCP's recommendation to reduce dose of Kevzara to '150mg'$  SQ every 2 weeks.  Per test claim, Kevzara at lower dose is covered through her insurance with no copay.  ATC patient to review. Phone rang and hung up. VM unable to be left.  Knox Saliva, PharmD, MPH, BCPS, CPP Clinical Pharmacist (Rheumatology and Pulmonology)

## 2022-04-21 NOTE — Telephone Encounter (Signed)
I think this is a reasonable suggestion. I reviewed the labs from 7/17 there is no significant change in liver enzymes or cell counts. However Vanessa Lara has a very low body mass and may have developed a UTI while on treatment. Would we be able to switch her to the lower dose [place]?

## 2022-04-24 ENCOUNTER — Other Ambulatory Visit (HOSPITAL_COMMUNITY): Payer: Self-pay

## 2022-04-24 ENCOUNTER — Other Ambulatory Visit: Payer: Self-pay | Admitting: Surgical

## 2022-04-24 MED ORDER — KEVZARA 150 MG/1.14ML ~~LOC~~ SOAJ
150.0000 mg | SUBCUTANEOUS | 2 refills | Status: DC
  Filled 2022-04-24: qty 2.28, 28d supply, fill #0
  Filled 2022-04-24: qty 2.28, fill #0

## 2022-04-24 NOTE — Addendum Note (Signed)
Addended by: Cassandria Anger on: 04/24/2022 11:42 AM   Modules accepted: Orders

## 2022-04-24 NOTE — Telephone Encounter (Signed)
I spoke with patient regarding the recommendation for her to reduce her Kevzara dose to '150mg'$  twice daily due to UTIs and that she is antibiotics for UTI ppx.   She states she is amenable to trying the '150mg'$  dose. She received shipmen from [facility] last week. Advised her that I will notify [facility] to ship out the '150mg'$  dose and that she should keep the '200mg'$  dose banded together in corner of fridge or discard to prevent confusion. She verbalized understanding.  Knox Saliva, PharmD, MPH, BCPS, CPP Clinical Pharmacist (Rheumatology and Pulmonology)

## 2022-04-25 ENCOUNTER — Other Ambulatory Visit (HOSPITAL_COMMUNITY): Payer: Self-pay

## 2022-04-26 ENCOUNTER — Ambulatory Visit: Payer: Medicare HMO | Admitting: Family

## 2022-04-26 ENCOUNTER — Other Ambulatory Visit (HOSPITAL_COMMUNITY): Payer: Self-pay

## 2022-04-28 ENCOUNTER — Other Ambulatory Visit (HOSPITAL_COMMUNITY): Payer: Self-pay

## 2022-05-01 ENCOUNTER — Other Ambulatory Visit (HOSPITAL_COMMUNITY): Payer: Self-pay

## 2022-05-01 ENCOUNTER — Other Ambulatory Visit: Payer: Self-pay | Admitting: Pharmacist

## 2022-05-01 DIAGNOSIS — M0579 Rheumatoid arthritis with rheumatoid factor of multiple sites without organ or systems involvement: Secondary | ICD-10-CM

## 2022-05-01 DIAGNOSIS — Z79899 Other long term (current) drug therapy: Secondary | ICD-10-CM

## 2022-05-01 MED ORDER — KEVZARA 150 MG/1.14ML ~~LOC~~ SOAJ
150.0000 mg | SUBCUTANEOUS | 2 refills | Status: DC
  Filled 2022-05-01 – 2022-05-19 (×2): qty 2.28, 28d supply, fill #0

## 2022-05-01 NOTE — Progress Notes (Deleted)
Office Visit Note  Patient: Vanessa Lara             Date of Birth: 11/23/1955           MRN: [id number]             PCP: Pablo Lawrence, NP Referring: Pablo Lawrence, NP Visit Date: 05/10/2022   Subjective:  No chief complaint on file.   History of Present Illness: Vanessa Lara is a 66 y.o. female here for follow up for rheumatoid arthritis with chronic joint pain in multiple areas and chronic fatigue labs showing elevated rheumatoid factor.    Previous HPI 02/07/2022  Vanessa Lara is a 66 y.o. female here for follow up for rheumatoid arthritis with chronic joint pain in multiple areas and chronic fatigue labs showing elevated rheumatoid factor. Kevzara new start on 12/15/2021. She felt an improvement when taking the injections within first weeks. Accidentally took the shots twice in a week due to forgetfulness or mistake but did not notice any major problems. She called in on account of increased joint pain but felt no improvement with the prescribed medrol dose pack. She did feel more lightheadedness or off balance feeling while taking this so stopped after about 3 days. Currently back pain and her left foot pain are worst issues.   Previous HPI 12/05/2021 Vanessa Lara is a 66 y.o. female here for evaluation of rheumatoid arthritis with chronic joint pain in multiple areas and chronic fatigue labs showing elevated rheumatoid factor. She has history of mitral valve prolapse, severe osteoporosis, osteoarthritis, and severe weight loss and deconditioning. She was diagnosed with seropositive RA in 2018 with Dr. Kathlene November and most recently saw Dr. Amil Amen in 11/2020. At that time recommended to start hydroxychloroquine for inflammatory arthritis symptoms. She previously tried methotrexate, simponi aria, and orencia with poor tolerance. Imaging consistent with degenerative arthritis in cervical spine and knee but also with some partial tendon tear in shoulder and some synovitis present in  knee on MRI imaging. She has taken multiple medications for pain including NSAIDs, prednisone, gabapentin, tramadol, and hydrocodone or oxycodone. She has a lot of pain in multiple areas but is also severely fatigued. She has significant weight loss down to 70 lbs unintentionally reports chronic IBS issues but no recent changes during this period of weight loss. Joint pain also limits her activity quite a bit with chronic deformities and also ongoing swelling in bilateral hands. She has also developed left sided headache localized above the temporal area. She has a lot of pain at the base of the skull and occiput without clear radiation of symptoms. She is scheduled to see Dr. Billey Gosling for headache evaluation and management 3/28.   10/2020 ESR 62   05/2017 RF 15 CCP 32 HBV neg HCV neg  No Rheumatology ROS completed.   PMFS History:  Patient Active Problem List   Diagnosis Date Noted   Medication monitoring encounter 12/05/2021   Physical deconditioning 11/11/2021   Elevated serum lactate dehydrogenase (LDH) 08/17/2021   Vitamin D deficiency 08/17/2021   Chronic fatigue 07/25/2021   Vitamin B12 deficiency 07/19/2021   Hardening of the aorta (main artery of the heart) ([place]) 06/21/2021   Solitary lung nodule 06/15/2021   Loss of weight 05/18/2021   Protein-calorie malnutrition, severe ([place]) 05/18/2021   Osteoporosis 06/15/2020   Iron deficiency anemia 04/09/2020   Primary osteoarthritis involving multiple joints 12/31/2018   Rheumatoid arthritis involving multiple sites with positive rheumatoid factor ([place]) 12/31/2018   Irritable  bowel syndrome with both constipation and diarrhea 10/28/2018   Constipation 03/14/2016   Mitral valve prolapse 11/23/2015   High cholesterol 11/23/2015    Past Medical History:  Diagnosis Date   Anemia    Anxiety    Arthritis    RA   Cancer (HCC)    Skin   CHF (congestive heart failure) (HCC)    Collagen vascular disease (HCC)    Depression     Endometriosis    High cholesterol    IBS (irritable bowel syndrome)    Lymphadenopathy    Mitral valve prolapse    RA (rheumatoid arthritis) (HCC)    Rotator cuff tear    bilateral   Uterus, adenomyosis     Family History  Problem Relation Age of Onset   Colon cancer Other    Bone cancer Mother    Brain cancer Father    Brain cancer Sister    Breast cancer Sister    Renal Disease Sister    Pancreatic cancer Neg Hx    Esophageal cancer Neg Hx    Stomach cancer Neg Hx    Rectal cancer Neg Hx    Past Surgical History:  Procedure Laterality Date   AXILLARY LYMPH NODE BIOPSY Right 10/12/2020   Procedure: RIGHT AXILLARY LYMPH NODE BIOPSY EXCISION;  Surgeon: Stark Klein, MD;  Location: [facility];  Service: General;  Laterality: Right;   BUNIONECTOMY Right    CARPAL TUNNEL RELEASE Right    COLONOSCOPY     COLONOSCOPY N/A 12/13/2015   Procedure: COLONOSCOPY;  Surgeon: Rogene Houston, MD;  [facility];  Service: Endoscopy;  Laterality: N/A;  9:55   Fatty tumor     2011 (left arm)   FOOT SURGERY     hammertoe   TONSILLECTOMY     TOTAL ABDOMINAL HYSTERECTOMY     2003   Social History   Social History Narrative   Not on file   Immunization History  Administered Date(s) Administered   Influenza, Quadrivalent, Recombinant, Inj, Pf 07/11/2017     Objective: Vital Signs: There were no vitals taken for this visit.   Physical Exam   Musculoskeletal Exam: ***  CDAI Exam: CDAI Score: -- Patient Global: --; Provider Global: -- Swollen: --; Tender: -- Joint Exam 05/10/2022   No joint exam has been documented for this visit   There is currently no information documented on the homunculus. Go to the Rheumatology activity and complete the homunculus joint exam.  Investigation: No additional findings.  Imaging: No results found.  Recent Labs: Lab Results  Component Value Date   WBC 8.9 02/07/2022   HGB 13.3 02/07/2022   PLT 236  02/07/2022   NA 137 02/07/2022   K 3.6 02/07/2022   CL 102 02/07/2022   CO2 29 02/07/2022   GLUCOSE 62 (L) 02/07/2022   BUN 15 02/07/2022   CREATININE 0.67 02/07/2022   BILITOT 0.3 02/07/2022   ALKPHOS 148 (H) 08/23/2020   AST 13 02/07/2022   ALT 6 02/07/2022   PROT 6.3 02/07/2022   ALBUMIN 3.6 08/23/2020   CALCIUM 9.8 02/07/2022   GFRAA 92 11/06/2019   QFTBGOLDPLUS NEGATIVE 12/05/2021    Speciality Comments: No specialty comments available.  Procedures:  No procedures performed Allergies: Abatacept, Codeine, Golimumab, Hydrocodone, Methylprednisolone, Methotrexate derivatives, and Prednisone   Assessment / Plan:     Visit Diagnoses: No diagnosis found.  ***  Orders: No orders of the defined types were placed in this encounter.  No orders  of the defined types were placed in this encounter.    Follow-Up Instructions: No follow-ups on file.   Bertram Savin, RT  Note - This record has been created using Editor, commissioning.  Chart creation errors have been sought, but may not always  have been located. Such creation errors do not reflect on  the standard of medical care.

## 2022-05-01 NOTE — Telephone Encounter (Signed)
Rx for Kevzara 150 mg every 14 days sent to [facility] for hospital-based cost pricing.   Total qty remaining is 2.28 ml x 2 refills   Knox Saliva, PharmD, MPH, BCPS, CPP Clinical Pharmacist (Rheumatology and Pulmonology)

## 2022-05-03 ENCOUNTER — Other Ambulatory Visit (HOSPITAL_COMMUNITY): Payer: Self-pay

## 2022-05-04 ENCOUNTER — Other Ambulatory Visit (HOSPITAL_COMMUNITY): Payer: Self-pay

## 2022-05-08 ENCOUNTER — Other Ambulatory Visit: Payer: Self-pay

## 2022-05-08 DIAGNOSIS — M1711 Unilateral primary osteoarthritis, right knee: Secondary | ICD-10-CM

## 2022-05-09 ENCOUNTER — Other Ambulatory Visit (HOSPITAL_COMMUNITY): Payer: Self-pay

## 2022-05-10 ENCOUNTER — Ambulatory Visit: Payer: Medicare HMO | Admitting: Orthopedic Surgery

## 2022-05-10 ENCOUNTER — Ambulatory Visit: Payer: Medicare HMO | Admitting: Internal Medicine

## 2022-05-10 DIAGNOSIS — K582 Mixed irritable bowel syndrome: Secondary | ICD-10-CM

## 2022-05-10 DIAGNOSIS — M159 Polyosteoarthritis, unspecified: Secondary | ICD-10-CM

## 2022-05-10 DIAGNOSIS — M0579 Rheumatoid arthritis with rheumatoid factor of multiple sites without organ or systems involvement: Secondary | ICD-10-CM

## 2022-05-10 DIAGNOSIS — R5382 Chronic fatigue, unspecified: Secondary | ICD-10-CM

## 2022-05-10 DIAGNOSIS — M810 Age-related osteoporosis without current pathological fracture: Secondary | ICD-10-CM

## 2022-05-10 DIAGNOSIS — Z79899 Other long term (current) drug therapy: Secondary | ICD-10-CM

## 2022-05-10 DIAGNOSIS — Z5181 Encounter for therapeutic drug level monitoring: Secondary | ICD-10-CM

## 2022-05-16 ENCOUNTER — Other Ambulatory Visit (HOSPITAL_COMMUNITY): Payer: Self-pay

## 2022-05-17 NOTE — Progress Notes (Signed)
Office Visit Note  Patient: Vanessa Lara             Date of Birth: 1955/12/29           MRN: [id number]             PCP: Pablo Lawrence, NP Referring: Pablo Lawrence, NP Visit Date: 05/18/2022   Subjective:  Follow-up (Left foot issues, medication issues.)   History of Present Illness: Vanessa Lara is a 66 y.o. female here for follow up for seropositive rheumatoid arthritis on Kevzara 150 mg [place] q14days. She feels arthritis has been fairly well controlled but has pain worse in her left foot mostly. She had GI issues suspected UTI but with negative culture since last visit. She feels a sensation "like her brain is numb." Also has ongoing upper neck pain without radiating symptoms.   Previous HPI 02/07/2022 Vanessa Lara is a 66 y.o. female here for follow up for rheumatoid arthritis with chronic joint pain in multiple areas and chronic fatigue labs showing elevated rheumatoid factor. Kevzara new start on 12/15/2021. She felt an improvement when taking the injections within first weeks. Accidentally took the shots twice in a week due to forgetfulness or mistake but did not notice any major problems. She called in on account of increased joint pain but felt no improvement with the prescribed medrol dose pack. She did feel more lightheadedness or off balance feeling while taking this so stopped after about 3 days. Currently back pain and her left foot pain are worst issues.   Previous HPI 12/05/2021 Vanessa Lara is a 66 y.o. female here for evaluation of rheumatoid arthritis with chronic joint pain in multiple areas and chronic fatigue labs showing elevated rheumatoid factor. She has history of mitral valve prolapse, severe osteoporosis, osteoarthritis, and severe weight loss and deconditioning. She was diagnosed with seropositive RA in 2018 with Dr. Kathlene November and most recently saw Dr. Amil Amen in 11/2020. At that time recommended to start hydroxychloroquine for inflammatory arthritis symptoms.  She previously tried methotrexate, simponi aria, and orencia with poor tolerance. Imaging consistent with degenerative arthritis in cervical spine and knee but also with some partial tendon tear in shoulder and some synovitis present in knee on MRI imaging. She has taken multiple medications for pain including NSAIDs, prednisone, gabapentin, tramadol, and hydrocodone or oxycodone. She has a lot of pain in multiple areas but is also severely fatigued. She has significant weight loss down to 70 lbs unintentionally reports chronic IBS issues but no recent changes during this period of weight loss. Joint pain also limits her activity quite a bit with chronic deformities and also ongoing swelling in bilateral hands. She has also developed left sided headache localized above the temporal area. She has a lot of pain at the base of the skull and occiput without clear radiation of symptoms. She is scheduled to see Dr. Billey Gosling for headache evaluation and management 3/28.   10/2020 ESR 62   05/2017 RF 15 CCP 32 HBV neg HCV neg   Review of Systems  Constitutional:  Positive for fatigue.  HENT:  Positive for mouth dryness. Negative for mouth sores.   Eyes:  Positive for dryness.  Respiratory:  Negative for shortness of breath.   Cardiovascular:  Negative for palpitations.  Gastrointestinal:  Positive for constipation and diarrhea. Negative for blood in stool.  Endocrine: Negative for increased urination.  Genitourinary:  Negative for involuntary urination.  Musculoskeletal:  Positive for joint pain, gait problem, joint pain, joint  swelling, muscle weakness, morning stiffness and muscle tenderness. Negative for myalgias and myalgias.  Skin:  Negative for color change, rash, hair loss and sensitivity to sunlight.  Allergic/Immunologic: Positive for susceptible to infections.  Neurological:  Positive for dizziness and headaches.  Hematological:  Negative for swollen glands.  Psychiatric/Behavioral:  Negative  for depressed mood and sleep disturbance. The patient is nervous/anxious.     PMFS History:  Patient Active Problem List   Diagnosis Date Noted   Medication monitoring encounter 12/05/2021   Physical deconditioning 11/11/2021   Elevated serum lactate dehydrogenase (LDH) 08/17/2021   Vitamin D deficiency 08/17/2021   Chronic fatigue 07/25/2021   Vitamin B12 deficiency 07/19/2021   Hardening of the aorta (main artery of the heart) ([place]) 06/21/2021   Solitary lung nodule 06/15/2021   Loss of weight 05/18/2021   Protein-calorie malnutrition, severe ([place]) 05/18/2021   Osteoporosis 06/15/2020   Iron deficiency anemia 04/09/2020   Primary osteoarthritis involving multiple joints 12/31/2018   Rheumatoid arthritis involving multiple sites with positive rheumatoid factor ([place]) 12/31/2018   Irritable bowel syndrome with both constipation and diarrhea 10/28/2018   Constipation 03/14/2016   Mitral valve prolapse 11/23/2015   High cholesterol 11/23/2015    Past Medical History:  Diagnosis Date   Anemia    Anxiety    Arthritis    RA   Cancer (HCC)    Skin   CHF (congestive heart failure) (HCC)    Collagen vascular disease (HCC)    Depression    Endometriosis    High cholesterol    IBS (irritable bowel syndrome)    Lymphadenopathy    Mitral valve prolapse    RA (rheumatoid arthritis) (HCC)    Rotator cuff tear    bilateral   Uterus, adenomyosis     Family History  Problem Relation Age of Onset   Colon cancer Other    Bone cancer Mother    Brain cancer Father    Brain cancer Sister    Breast cancer Sister    Renal Disease Sister    Pancreatic cancer Neg Hx    Esophageal cancer Neg Hx    Stomach cancer Neg Hx    Rectal cancer Neg Hx    Past Surgical History:  Procedure Laterality Date   AXILLARY LYMPH NODE BIOPSY Right 10/12/2020   Procedure: RIGHT AXILLARY LYMPH NODE BIOPSY EXCISION;  Surgeon: Stark Klein, MD;  Location: [facility];  Service: General;   Laterality: Right;   BUNIONECTOMY Right    CARPAL TUNNEL RELEASE Right    COLONOSCOPY     COLONOSCOPY N/A 12/13/2015   Procedure: COLONOSCOPY;  Surgeon: Rogene Houston, MD;  [facility];  Service: Endoscopy;  Laterality: N/A;  9:55   Fatty tumor     2011 (left arm)   FOOT SURGERY     hammertoe   TONSILLECTOMY     TOTAL ABDOMINAL HYSTERECTOMY     2003   Social History   Social History Narrative   Not on file   Immunization History  Administered Date(s) Administered   Influenza, Quadrivalent, Recombinant, Inj, Pf 07/11/2017     Objective: Vital Signs: BP 104/66 (BP Location: Left Arm, Patient Position: Sitting, Cuff Size: Normal)   Pulse 93   Resp 15   Ht 5' (1.524 m)   Wt 75 lb 6.4 oz (34.2 kg)   BMI 14.73 kg/m    Physical Exam Constitutional:      Comments: Underweight  Cardiovascular:     Rate and  Rhythm: Normal rate and regular rhythm.  Pulmonary:     Effort: Pulmonary effort is normal.     Breath sounds: Normal breath sounds.  Musculoskeletal:     Right lower leg: No edema.     Left lower leg: No edema.  Skin:    Findings: No rash.  Neurological:     Mental Status: She is alert.      Musculoskeletal Exam:  Slightly decreased ROM tenderness to pressure on both sides at upper neck Shoulders decreased abduction ROM, right shoulder tenderness to pressure extending towards biceps tendon Elbows full ROM no tenderness or swelling Wrists full ROM no tenderness or swelling Fingers full ROM mild MCP ulnar deviation and reducible subluxation Knees full ROM no tenderness or swelling Ankles full ROM no tenderness or swelling 1st MTP lateral deviation and decreased extension ROM, cocked up deformities of 2nd-4th toes not reducible  CDAI Exam: CDAI Score: 5  Patient Global: 20 mm; Provider Global: 20 mm Swollen: 0 ; Tender: 3  Joint Exam 05/18/2022      Right  Left  Glenohumeral   Tender     MTP 2      Tender  MTP 5      Tender      Investigation: No additional findings.  Imaging: No results found.  Recent Labs: Lab Results  Component Value Date   WBC 8.9 02/07/2022   HGB 13.3 02/07/2022   PLT 236 02/07/2022   NA 137 02/07/2022   K 3.6 02/07/2022   CL 102 02/07/2022   CO2 29 02/07/2022   GLUCOSE 62 (L) 02/07/2022   BUN 15 02/07/2022   CREATININE 0.67 02/07/2022   BILITOT 0.3 02/07/2022   ALKPHOS 148 (H) 08/23/2020   AST 13 02/07/2022   ALT 6 02/07/2022   PROT 6.3 02/07/2022   ALBUMIN 3.6 08/23/2020   CALCIUM 9.8 02/07/2022   GFRAA 92 11/06/2019   QFTBGOLDPLUS NEGATIVE 12/05/2021    Speciality Comments: No specialty comments available.  Procedures:  No procedures performed Allergies: Abatacept, Codeine, Golimumab, Hydrocodone, Methylprednisolone, Methotrexate derivatives, and Prednisone   Assessment / Plan:     Visit Diagnoses: Rheumatoid arthritis involving multiple sites with positive rheumatoid factor ([place]) - Plan: Sedimentation rate  Inflammatory arthritis appears to be pretty well controlled she is having the most problem with her chronic foot deformities also chronic back pains.  Checking sedimentation rate for disease activity monitoring.  She has not seen any problem with decreasing the Kevzara dosing but has only been about a month so far.  Plan to continue Kevzara 150 mg subcu q. 14 days.  High risk medication use - Kevzara 125m subcutaneously every 14 days - Plan: CBC with Differential/Platelet, COMPLETE METABOLIC PANEL WITH GFR  Questioning whether medication be causing some nonspecific brain fogginess.  I am not aware of any specific association with KDarcus Pesterand a problem like this in the symptoms are not well-defined.  We will recheck her CBC and CMP for medication monitoring.  If she is having any abnormalities may benefit with dosing frequency adjustment as well as she has a very low body mass.  Age-related osteoporosis without current pathological fracture  Currently on  Forteo injections since last year through orthopedic surgery clinic.  She is reporting having trouble with the current product and needle guard tip reports the previous one obtained through [facility] was easier for her to operate, she does have some dexterity and strength limitations from arthritis.  We will check with pharmacist  about accessibility or order the easier products.  Primary osteoarthritis involving multiple joints  Chronic fatigue Irritable bowel syndrome with both constipation and diarrhea  Problems are ongoing but not in particular exacerbation.  Vitamin D deficiency - Plan: VITAMIN D 25 Hydroxy (Vit-D Deficiency, Fractures)  Checking vitamin D level with discussion on continuing versus adjustment of her osteoporosis treatment.  Orders: Orders Placed This Encounter  Procedures   Sedimentation rate   CBC with Differential/Platelet   COMPLETE METABOLIC PANEL WITH GFR   VITAMIN D 25 Hydroxy (Vit-D Deficiency, Fractures)   No orders of the defined types were placed in this encounter.    Follow-Up Instructions: Return in about 3 months (around 08/18/2022) for RA on KEV f/u 12mo.   CCollier Salina MD  Note - This record has been created using [employer]  Chart creation errors have been sought, but may not always  have been located. Such creation errors do not reflect on  the standard of medical care.

## 2022-05-18 ENCOUNTER — Ambulatory Visit: Payer: Medicare HMO | Admitting: Psychiatry

## 2022-05-18 ENCOUNTER — Ambulatory Visit: Payer: Medicare HMO | Attending: Internal Medicine | Admitting: Internal Medicine

## 2022-05-18 ENCOUNTER — Encounter: Payer: Self-pay | Admitting: Internal Medicine

## 2022-05-18 ENCOUNTER — Other Ambulatory Visit (HOSPITAL_COMMUNITY): Payer: Self-pay

## 2022-05-18 VITALS — BP 104/66 | HR 93 | Resp 15 | Ht 60.0 in | Wt 75.4 lb

## 2022-05-18 DIAGNOSIS — Z79899 Other long term (current) drug therapy: Secondary | ICD-10-CM | POA: Diagnosis not present

## 2022-05-18 DIAGNOSIS — R5382 Chronic fatigue, unspecified: Secondary | ICD-10-CM

## 2022-05-18 DIAGNOSIS — M159 Polyosteoarthritis, unspecified: Secondary | ICD-10-CM

## 2022-05-18 DIAGNOSIS — M150 Primary generalized (osteo)arthritis: Secondary | ICD-10-CM

## 2022-05-18 DIAGNOSIS — E559 Vitamin D deficiency, unspecified: Secondary | ICD-10-CM

## 2022-05-18 DIAGNOSIS — M810 Age-related osteoporosis without current pathological fracture: Secondary | ICD-10-CM | POA: Diagnosis not present

## 2022-05-18 DIAGNOSIS — M0579 Rheumatoid arthritis with rheumatoid factor of multiple sites without organ or systems involvement: Secondary | ICD-10-CM

## 2022-05-18 DIAGNOSIS — K582 Mixed irritable bowel syndrome: Secondary | ICD-10-CM

## 2022-05-19 ENCOUNTER — Other Ambulatory Visit (HOSPITAL_COMMUNITY): Payer: Self-pay

## 2022-05-19 ENCOUNTER — Telehealth: Payer: Self-pay | Admitting: Internal Medicine

## 2022-05-19 LAB — COMPLETE METABOLIC PANEL WITH GFR
AG Ratio: 2.4 (calc) (ref 1.0–2.5)
ALT: 8 U/L (ref 6–29)
AST: 15 U/L (ref 10–35)
Albumin: 4.6 g/dL (ref 3.6–5.1)
Alkaline phosphatase (APISO): 56 U/L (ref 37–153)
BUN: 10 mg/dL (ref 7–25)
CO2: 26 mmol/L (ref 20–32)
Calcium: 9.7 mg/dL (ref 8.6–10.4)
Chloride: 104 mmol/L (ref 98–110)
Creat: 0.78 mg/dL (ref 0.50–1.05)
Globulin: 1.9 g/dL (calc) (ref 1.9–3.7)
Glucose, Bld: 66 mg/dL (ref 65–99)
Potassium: 3.6 mmol/L (ref 3.5–5.3)
Sodium: 137 mmol/L (ref 135–146)
Total Bilirubin: 0.5 mg/dL (ref 0.2–1.2)
Total Protein: 6.5 g/dL (ref 6.1–8.1)
eGFR: 84 mL/min/{1.73_m2} (ref >=60)

## 2022-05-19 LAB — CBC WITH DIFFERENTIAL/PLATELET
Absolute Monocytes: 614 {cells}/uL (ref 200–950)
Basophils Absolute: 83 {cells}/uL (ref 0–200)
Basophils Relative: 1.2 %
Eosinophils Absolute: 214 {cells}/uL (ref 15–500)
Eosinophils Relative: 3.1 %
HCT: 37.3 % (ref 35.0–45.0)
Hemoglobin: 12.5 g/dL (ref 11.7–15.5)
Lymphs Abs: 2498 {cells}/uL (ref 850–3900)
MCH: 30.8 pg (ref 27.0–33.0)
MCHC: 33.5 g/dL (ref 32.0–36.0)
MCV: 91.9 fL (ref 80.0–100.0)
MPV: 10.7 fL (ref 7.5–12.5)
Monocytes Relative: 8.9 %
Neutro Abs: 3491 {cells}/uL (ref 1500–7800)
Neutrophils Relative %: 50.6 %
Platelets: 213 10*3/uL (ref 140–400)
RBC: 4.06 Million/uL (ref 3.80–5.10)
RDW: 12.6 % (ref 11.0–15.0)
Total Lymphocyte: 36.2 %
WBC: 6.9 10*3/uL (ref 3.8–10.8)

## 2022-05-19 LAB — VITAMIN D 25 HYDROXY (VIT D DEFICIENCY, FRACTURES): Vit D, 25-Hydroxy: 62 ng/mL (ref 30–100)

## 2022-05-19 LAB — SEDIMENTATION RATE: Sed Rate: 2 mm/h (ref 0–30)

## 2022-05-19 NOTE — Telephone Encounter (Signed)
Patient left a voicemail stating she missed a call regarding labs. Patient requests a call back.

## 2022-05-19 NOTE — Progress Notes (Signed)
Lab results look normal I don't see any evidence of problems with the kevzara. I recommend she continue on the low dose shots still once every 2 weeks for now.

## 2022-05-22 ENCOUNTER — Telehealth: Payer: Self-pay | Admitting: Pharmacist

## 2022-05-22 ENCOUNTER — Other Ambulatory Visit (HOSPITAL_COMMUNITY): Payer: Self-pay

## 2022-05-22 ENCOUNTER — Telehealth: Payer: Self-pay

## 2022-05-22 NOTE — Telephone Encounter (Signed)
Yes that is something we do.  Vanessa Lara/Vanessa Lara can show you how to print out the results

## 2022-05-22 NOTE — Telephone Encounter (Signed)
Mailed paper copy of lab results to patient.

## 2022-05-22 NOTE — Telephone Encounter (Addendum)
Patient states [facility] is shipping her a new supply of pen needles (different manufacturer).  She states that the pen needles she received from manufacturer were the best, however the new pen needles she's been using leave some medication in the needle guard (she states it looks like half of the dose). She confirms she is not reusing needles. She confirms that the pen needle is screwed tightly onto injector device  She will f/u if new needles cause same concerns. However, the only feasible solution is to trial different pen needle manufacturers until she finds the most appropriate option  Knox Saliva, PharmD, MPH, BCPS, CPP Clinical Pharmacist (Rheumatology and Pulmonology)  ----- Message from Collier Salina, MD sent at 05/21/2022 10:50 AM EDT ----- Regarding: Forteo pen Ms. Mervine had questions about her [place] is being prescribed through the orthopedics office not [place] but is relevant to her rheumatoid arthritis.  She states she was getting this medicine it sounds like from [facility] with a blue cap and had a much easier time with injections but is now getting a product with some type of different needle guard and states she is having a lot of wasted medication that going in. I am not familiar enough with the product to be sure what she means, sorry for being vague. Can you look at her prescription and see if there is something that would be a change in product like this? Thanks.

## 2022-05-22 NOTE — Telephone Encounter (Signed)
Patient requested we mail her lab results to her. Is this something we do? Thanks!

## 2022-05-26 ENCOUNTER — Telehealth: Payer: Self-pay

## 2022-05-26 ENCOUNTER — Telehealth: Payer: Self-pay | Admitting: Internal Medicine

## 2022-05-26 NOTE — Telephone Encounter (Signed)
Patient has decided that she can't take Darcus Pester because it is causing her to sweat profusely. She says the medicine is making her feel very bad and exhausted. She is wondering wether or not to keep next appointment. She is also wondering what she can take to make her feel better. Please advise.

## 2022-05-26 NOTE — Telephone Encounter (Signed)
Advised patient that we are sorry to hear she is having additional problems. Per Dr. Marveen Reeks note she would need to stop taking the medicine for a few weeks to see if these symptoms are truly related. Dr. Benjamine Mola says it should get better within a month or so I'd expect if it is medicine related. Advised if she does see improvement from stopping kevzara but joint pains get worse again we could follow up sooner. Patient states she would like an appointment scheduled sooner than 08/15/2022, so we scheduled her for 06/28/2022 at 1 PM.

## 2022-05-26 NOTE — Telephone Encounter (Signed)
Patient left a voicemail requesting to speak with Dr. Benjamine Mola or his assistant.

## 2022-05-26 NOTE — Telephone Encounter (Signed)
Sorry to hear she is having additional problems. She would need to stop taking the medicine for a few weeks to see if these symptoms are truly related. Should get better within a month or so I'd expect if it is medicine related. If she does see improvement from stopping kevzara but joint pains get worse again we could follow up sooner

## 2022-05-30 ENCOUNTER — Other Ambulatory Visit (HOSPITAL_COMMUNITY): Payer: Self-pay

## 2022-06-02 DIAGNOSIS — I639 Cerebral infarction, unspecified: Secondary | ICD-10-CM

## 2022-06-02 HISTORY — DX: Cerebral infarction, unspecified: I63.9

## 2022-06-06 ENCOUNTER — Other Ambulatory Visit (HOSPITAL_COMMUNITY): Payer: Self-pay

## 2022-06-08 ENCOUNTER — Other Ambulatory Visit (HOSPITAL_COMMUNITY): Payer: Self-pay

## 2022-06-14 ENCOUNTER — Other Ambulatory Visit (HOSPITAL_COMMUNITY): Payer: Self-pay

## 2022-06-16 NOTE — Progress Notes (Unsigned)
Office Visit Note  Patient: Vanessa Lara             Date of Birth: [birth year]           MRN: [id number]             PCP: Pablo Lawrence, NP Referring: Pablo Lawrence, NP Visit Date: 06/28/2022   Subjective:  Follow-up (Severe back pain on right side with rad sx into ribs. )   History of Present Illness: Vanessa Lara is a 66 y.o. female here for follow up for seropositive RA now off of treatment after stopping the [place].  Since she stopped taking the medication her complaint of night sweats and fatigue have improved.  She has seen a worsening of joint pain in several areas particularly around the MCP joints of both hands.  For knee pain she saw orthopedic surgery clinic yesterday and had injections on both sides.  She also discussed getting set up to do right SI joint steroid injection.  Biggest complaint today is back pain on the right side around the middle of the back and around the scapula.  This bothers her especially with certain movements and trying to bend side to side.   Previous HPI 05/18/2022  Vanessa Lara is a 66 y.o. female here for follow up for seropositive rheumatoid arthritis on Kevzara 150 mg [place] q14days. She feels arthritis has been fairly well controlled but has pain worse in her left foot mostly. She had GI issues suspected UTI but with negative culture since last visit. She feels a sensation "like her brain is numb." Also has ongoing upper neck pain without radiating symptoms.    Previous HPI 02/07/2022 Vanessa Lara is a 67 y.o. female here for follow up for rheumatoid arthritis with chronic joint pain in multiple areas and chronic fatigue labs showing elevated rheumatoid factor. Kevzara new start on 12/15/2021. She felt an improvement when taking the injections within first weeks. Accidentally took the shots twice in a week due to forgetfulness or mistake but did not notice any major problems. She called in on account of increased joint pain but felt no  improvement with the prescribed medrol dose pack. She did feel more lightheadedness or off balance feeling while taking this so stopped after about 3 days. Currently back pain and her left foot pain are worst issues.   Previous HPI 12/05/2021 Vanessa Lara is a 66 y.o. female here for evaluation of rheumatoid arthritis with chronic joint pain in multiple areas and chronic fatigue labs showing elevated rheumatoid factor. She has history of mitral valve prolapse, severe osteoporosis, osteoarthritis, and severe weight loss and deconditioning. She was diagnosed with seropositive RA in 2018 with Dr. Kathlene November and most recently saw Dr. Amil Amen in 11/2020. At that time recommended to start hydroxychloroquine for inflammatory arthritis symptoms. She previously tried methotrexate, simponi aria, and orencia with poor tolerance. Imaging consistent with degenerative arthritis in cervical spine and knee but also with some partial tendon tear in shoulder and some synovitis present in knee on MRI imaging. She has taken multiple medications for pain including NSAIDs, prednisone, gabapentin, tramadol, and hydrocodone or oxycodone. She has a lot of pain in multiple areas but is also severely fatigued. She has significant weight loss down to 70 lbs unintentionally reports chronic IBS issues but no recent changes during this period of weight loss. Joint pain also limits her activity quite a bit with chronic deformities and also ongoing swelling in bilateral hands. She has also  developed left sided headache localized above the temporal area. She has a lot of pain at the base of the skull and occiput without clear radiation of symptoms. She is scheduled to see Dr. Billey Gosling for headache evaluation and management 3/28.   10/2020 ESR 62   05/2017 RF 15 CCP 32 HBV neg HCV neg     Review of Systems  Constitutional:  Positive for fatigue.  HENT:  Negative for mouth sores and mouth dryness.   Eyes:  Positive for dryness.   Respiratory:  Negative for shortness of breath.   Cardiovascular:  Positive for palpitations. Negative for chest pain.  Gastrointestinal:  Positive for constipation and diarrhea. Negative for blood in stool.  Endocrine: Negative for increased urination.  Genitourinary:  Positive for involuntary urination.  Musculoskeletal:  Positive for joint pain, gait problem, joint pain, myalgias, muscle weakness, morning stiffness, muscle tenderness and myalgias.  Skin:  Positive for sensitivity to sunlight. Negative for color change, rash and hair loss.  Allergic/Immunologic: Positive for susceptible to infections.  Neurological:  Positive for dizziness and headaches.  Hematological:  Negative for swollen glands.  Psychiatric/Behavioral:  Positive for depressed mood. Negative for sleep disturbance. The patient is nervous/anxious.     PMFS History:  Patient Active Problem List   Diagnosis Date Noted   Medication monitoring encounter 12/05/2021   Physical deconditioning 11/11/2021   Elevated serum lactate dehydrogenase (LDH) 08/17/2021   Vitamin D deficiency 08/17/2021   Chronic fatigue 07/25/2021   Vitamin B12 deficiency 07/19/2021   Hardening of the aorta (main artery of the heart) ([place]) 06/21/2021   Solitary lung nodule 06/15/2021   Loss of weight 05/18/2021   Protein-calorie malnutrition, severe ([place]) 05/18/2021   Osteoporosis 06/15/2020   Iron deficiency anemia 04/09/2020   Primary osteoarthritis involving multiple joints 12/31/2018   Rheumatoid arthritis involving multiple sites with positive rheumatoid factor ([place]) 12/31/2018   Irritable bowel syndrome with both constipation and diarrhea 10/28/2018   Constipation 03/14/2016   Mitral valve prolapse 11/23/2015   High cholesterol 11/23/2015    Past Medical History:  Diagnosis Date   Anemia    Anxiety    Arthritis    RA   Cancer (HCC)    Skin   CHF (congestive heart failure) (HCC)    Collagen vascular disease (HCC)    Depression     Endometriosis    High cholesterol    IBS (irritable bowel syndrome)    Lymphadenopathy    Mitral valve prolapse    RA (rheumatoid arthritis) (HCC)    Rotator cuff tear    bilateral   Uterus, adenomyosis     Family History  Problem Relation Age of Onset   Colon cancer Other    Bone cancer Mother    Brain cancer Father    Brain cancer Sister    Breast cancer Sister    Renal Disease Sister    Pancreatic cancer Neg Hx    Esophageal cancer Neg Hx    Stomach cancer Neg Hx    Rectal cancer Neg Hx    Past Surgical History:  Procedure Laterality Date   AXILLARY LYMPH NODE BIOPSY Right 10/12/2020   Procedure: RIGHT AXILLARY LYMPH NODE BIOPSY EXCISION;  Surgeon: Stark Klein, MD;  Location: [facility];  Service: General;  Laterality: Right;   BUNIONECTOMY Right    CARPAL TUNNEL RELEASE Right    COLONOSCOPY     COLONOSCOPY N/A 12/13/2015   Procedure: COLONOSCOPY;  Surgeon: Rogene Houston, MD;  [facility];  Service: Endoscopy;  Laterality: N/A;  9:55   Fatty tumor     2011 (left arm)   FOOT SURGERY     hammertoe   TONSILLECTOMY     TOTAL ABDOMINAL HYSTERECTOMY     2003   Social History   Social History Narrative   Not on file   Immunization History  Administered Date(s) Administered   Influenza, Quadrivalent, Recombinant, Inj, Pf 07/11/2017     Objective: Vital Signs: BP (!) 89/58 (BP Location: Left Arm, Patient Position: Sitting, Cuff Size: Normal)   Pulse (!) 105   Resp 16   Ht 5' (1.524 m)   Wt 74 lb 6.4 oz (33.7 kg)   BMI 14.53 kg/m    Physical Exam Cardiovascular:     Rate and Rhythm: Regular rhythm. Tachycardia present.  Pulmonary:     Effort: Pulmonary effort is normal.     Breath sounds: Normal breath sounds.  Musculoskeletal:     Right lower leg: No edema.     Left lower leg: No edema.  Lymphadenopathy:     Cervical: No cervical adenopathy.  Skin:    General: Skin is warm and dry.     Findings: No rash.   Neurological:     Mental Status: She is alert.  Psychiatric:        Mood and Affect: Mood normal.      Musculoskeletal Exam:  Shoulder abduction ROM slightly decreased b/l Elbows full ROM no tenderness or swelling Wrists full ROM no tenderness or swelling Fingers full ROM MCP joint tenderness without palpable swelling, there is reversible ulnar deviation in both hands Tenderness to pressure over paraspinal muscles on the right side in the mid to low back, along bottom border of right scapula, no midline tenderness to pressure Knees full ROM no tenderness or swelling  Investigation: No additional findings.  Imaging: No results found.  Recent Labs: Lab Results  Component Value Date   WBC 6.9 05/18/2022   HGB 12.5 05/18/2022   PLT 213 05/18/2022   NA 137 05/18/2022   K 3.6 05/18/2022   CL 104 05/18/2022   CO2 26 05/18/2022   GLUCOSE 66 05/18/2022   BUN 10 05/18/2022   CREATININE 0.78 05/18/2022   BILITOT 0.5 05/18/2022   ALKPHOS 148 (H) 08/23/2020   AST 15 05/18/2022   ALT 8 05/18/2022   PROT 6.5 05/18/2022   ALBUMIN 3.6 08/23/2020   CALCIUM 9.7 05/18/2022   GFRAA 92 11/06/2019   QFTBGOLDPLUS NEGATIVE 12/05/2021    Speciality Comments: No specialty comments available.  Procedures:  No procedures performed Allergies: Abatacept, Codeine, Golimumab, Hydrocodone, Methylprednisolone, Methotrexate derivatives, and Prednisone   Assessment / Plan:     Visit Diagnoses: Rheumatoid arthritis involving multiple sites with positive rheumatoid factor ([place]) - Plan: predniSONE (DELTASONE) 5 MG tablet  Reports a significant intolerance with the Darcus Pester so we will discontinue this.  Is having some increase in joint pain complaint in several areas.  Not much active synovitis on exam.  Will prescribe the prednisone 5 mg p.o. she can take up to once daily as needed for breakthrough symptoms while off DMARD treatment for now.  Can try additional medications but will recommend wait and  see for now.  High risk medication use   Reports symptoms of sweating at nighttime and fatigue are doing better since stopping the Kevzara.  She did not have any lab abnormalities to suggest intolerance.  Age-related osteoporosis without current pathological fracture  On Forteo injections daily and vitamin  D supplement 1000 units.  Primary osteoarthritis involving multiple joints  Discussed knee pains and SI joint pains suspect more degenerative arthritis problem than RA activity.  She just had local injections at the knees.  I encouraged her to follow-up as recommended about her iliac joint pains.  Acute right-sided thoracic back pain - Plan: Ambulatory referral to Physical Therapy  Right-sided back pain on exam localizes very well to paraspinal muscles with some radiating extension such as myofascial pain.  Recommended trying at night muscle relaxer medication for this.  She was previously on baclofen prescribed by Dr. Louanne Skye but does not take this due to lack of efficacy.  We will also refer to physical therapy for evaluation and treatment.  Orders: Orders Placed This Encounter  Procedures   Ambulatory referral to Physical Therapy   Meds ordered this encounter  Medications   predniSONE (DELTASONE) 5 MG tablet    Sig: Take 1 tablet (5 mg total) by mouth daily with breakfast.    Dispense:  30 tablet    Refill:  1     Follow-Up Instructions: Return in about 3 months (around 09/27/2022) for RA obs/PRN GC f/u 61mo.   CCollier Salina MD  Note - This record has been created using [employer]  Chart creation errors have been sought, but may not always  have been located. Such creation errors do not reflect on  the standard of medical care.

## 2022-06-21 ENCOUNTER — Ambulatory Visit: Payer: Medicare HMO | Admitting: Orthopedic Surgery

## 2022-06-27 ENCOUNTER — Ambulatory Visit: Payer: Self-pay

## 2022-06-27 ENCOUNTER — Encounter: Payer: Self-pay | Admitting: Surgical

## 2022-06-27 ENCOUNTER — Ambulatory Visit (INDEPENDENT_AMBULATORY_CARE_PROVIDER_SITE_OTHER): Payer: Medicare HMO | Admitting: Surgical

## 2022-06-27 ENCOUNTER — Other Ambulatory Visit (HOSPITAL_COMMUNITY): Payer: Self-pay

## 2022-06-27 DIAGNOSIS — M545 Low back pain, unspecified: Secondary | ICD-10-CM

## 2022-06-27 DIAGNOSIS — M1711 Unilateral primary osteoarthritis, right knee: Secondary | ICD-10-CM | POA: Diagnosis not present

## 2022-06-27 DIAGNOSIS — M170 Bilateral primary osteoarthritis of knee: Secondary | ICD-10-CM

## 2022-06-27 DIAGNOSIS — M1712 Unilateral primary osteoarthritis, left knee: Secondary | ICD-10-CM

## 2022-06-27 MED ORDER — LIDOCAINE HCL 1 % IJ SOLN
5.0000 mL | INTRAMUSCULAR | Status: AC | PRN
  Administered 2022-06-27: 5 mL

## 2022-06-27 MED ORDER — BUPIVACAINE HCL 0.25 % IJ SOLN
4.0000 mL | INTRAMUSCULAR | Status: AC | PRN
  Administered 2022-06-27: 4 mL via INTRA_ARTICULAR

## 2022-06-27 MED ORDER — METHYLPREDNISOLONE ACETATE 40 MG/ML IJ SUSP
40.0000 mg | INTRAMUSCULAR | Status: AC | PRN
  Administered 2022-06-27: 40 mg via INTRA_ARTICULAR

## 2022-06-27 NOTE — Progress Notes (Signed)
Office Visit Note   Patient: Vanessa Lara           Date of Birth: 1956-08-08           MRN: [id number] Visit Date: 06/27/2022 Requested by: Pablo Lawrence, NP [place],  [place] [address] PCP: Pablo Lawrence, NP  Subjective: Chief Complaint  Patient presents with   Left Knee - Pain   Right Knee - Pain    HPI: Vanessa Lara is a 66 y.o. female who presents to the office complaining of bilateral knee pain.  She has history of bilateral knee arthritis and rheumatoid arthritis.  She has been seeing Dr. Benjamine Mola who is her rheumatologist but recently had to discontinue her RA medication due to her perceived side effect where she developed a lot of night sweats though she was on this medication for several months before she started noticing these side effects.  Since she has discontinued this medication, she has had worsening joint pain, especially in her knees and MCP joints.  She also notes increased pain in her low back and mid back without injury.  No radicular pain down the legs.  Will occasionally have some pain that travels around her flank from the thoracic spine but this is inconsistent.  She has no numbness or tingling.  The vast majority of her pain is localized to the upper right buttock..                ROS: All systems reviewed are negative as they relate to the chief complaint within the history of present illness.  Patient denies fevers or chills.  Assessment & Plan: Visit Diagnoses:  1. Low back pain, unspecified back pain laterality, unspecified chronicity, unspecified whether sciatica present   2. Primary osteoarthritis of left knee   3. Primary osteoarthritis of right knee     Plan: Patient is a 66 year old female who presents for evaluation of bilateral knee pain.  Also has right-sided thoracic spine and low back pain with some SI joint pain based on her exam today.  She would like to have bilateral knee injections.  Last injections were 3 months  ago.  Tolerated injections well today.  Radiographs taken today of the thoracic and lumbar spine demonstrate some loss of lordosis of the lumbar spine and no spondylolisthesis.  Does have some degeneration of both SI joints today that is not significantly progressed since last radiographs in 2019.  She has some disc base narrowing at multiple levels and primarily the upper thoracic spine where she is not having as much pain.  Discussed the options of outpatient including physical therapy versus diagnostic/therapeutic right-sided sacroiliac joint injection versus MRI scan of T-spine for further evaluation.  She did have MRI of the lumbar spine from 2022 demonstrating mild degenerative changes without any stenosis.  She will consider her options and call the office later this week with her decision of which direction she wants to go.  If she would like to try sacroiliac joint injection, she will be set up for injection with Dr. Rolena Infante.  Follow-Up Instructions: No follow-ups on file.   Orders:  Orders Placed This Encounter  Procedures   XR Thoracic Spine 2 View   XR Lumbar Spine 2-3 Views   No orders of the defined types were placed in this encounter.     Procedures: Large Joint Inj: bilateral knee on 06/27/2022 4:58 PM Indications: diagnostic evaluation, joint swelling and pain Details: 22 G 1.5  in needle, superolateral approach  Arthrogram: No  Medications (Right): 5 mL lidocaine 1 %; 4 mL bupivacaine 0.25 %; 40 mg methylPREDNISolone acetate 40 MG/ML Medications (Left): 5 mL lidocaine 1 %; 4 mL bupivacaine 0.25 %; 40 mg methylPREDNISolone acetate 40 MG/ML Outcome: tolerated well, no immediate complications Procedure, treatment alternatives, risks and benefits explained, specific risks discussed. Consent was given by the patient. Immediately prior to procedure a time out was called to verify the correct patient, procedure, equipment, support staff and site/side marked as required. Patient was  prepped and draped in the usual sterile fashion.       Clinical Data: No additional findings.  Objective: Vital Signs: There were no vitals taken for this visit.  Physical Exam:  Constitutional: Patient appears well-developed HEENT:  Head: Normocephalic Eyes:EOM are normal Neck: Normal range of motion Cardiovascular: Normal rate Pulmonary/chest: Effort normal Neurologic: Patient is alert Skin: Skin is warm Psychiatric: Patient has normal mood and affect  Ortho Exam: Ortho exam demonstrates bilateral knees without effusion.  Well-preserved range of motion.  Tenderness over the medial and lateral joint lines of both knees.  She is able to perform straight leg raise with both legs.  No calf tenderness.  Negative Homans' sign.  No cellulitis or skin changes noted.  5/5 motor strength of bilateral hip flexion, quadricep, hamstring, dorsiflexion, plantarflexion.  No pain with hip range of motion.  No tenderness over the greater trochanter of either extremity.  Does have tenderness over the right SI joint.  No tenderness over the left SI joint.  Mild to moderate tenderness throughout the axial lumbar and thoracic spine with most of her pain around the thoracic spine localized to the right paraspinal musculature around the level of T8-T10.  Specialty Comments:  MRI CERVICAL SPINE WITHOUT CONTRAST   TECHNIQUE: Multiplanar, multisequence MR imaging of the cervical spine was performed. No intravenous contrast was administered.   COMPARISON:  None.   FINDINGS: Alignment: 2 mm retrolisthesis of C5 on C6.   Vertebrae: No acute fracture, evidence of discitis, or bone lesion.   Cord: Normal signal and morphology.   Posterior Fossa, vertebral arteries, paraspinal tissues: Posterior fossa demonstrates no focal abnormality. Vertebral artery flow voids are maintained. Paraspinal soft tissues are unremarkable.   Disc levels:   Discs: Degenerative disease with disc height loss at C4-5 and  C5-6.   C2-3: No significant disc bulge. No neural foraminal stenosis. No central canal stenosis.   C3-4: Mild broad-based disc bulge. No foraminal or central canal stenosis.   C4-5: Mild broad-based disc bulge. No foraminal or central canal stenosis.   C5-6: Mild broad-based disc bulge. Bilateral uncovertebral degenerative changes. Moderate left and severe right foraminal stenosis. No spinal stenosis.   C6-7: Mild broad-based disc bulge. No foraminal or central canal stenosis.   C7-T1: No significant disc bulge. No neural foraminal stenosis. No central canal stenosis.   IMPRESSION: 1. At C5-6 there is a mild broad-based disc bulge. Bilateral uncovertebral degenerative changes. Moderate left and severe right foraminal stenosis. 2. Otherwise, mild cervical spine spondylosis as described above.     Electronically Signed   By: Kathreen Devoid M.D.   On: 12/16/2021 10:51  Imaging: No results found.   PMFS History: Patient Active Problem List   Diagnosis Date Noted   Medication monitoring encounter 12/05/2021   Physical deconditioning 11/11/2021   Elevated serum lactate dehydrogenase (LDH) 08/17/2021   Vitamin D deficiency 08/17/2021   Chronic fatigue 07/25/2021   Vitamin B12 deficiency 07/19/2021   Hardening  of the aorta (main artery of the heart) ([place]) 06/21/2021   Solitary lung nodule 06/15/2021   Loss of weight 05/18/2021   Protein-calorie malnutrition, severe ([place]) 05/18/2021   Osteoporosis 06/15/2020   Iron deficiency anemia 04/09/2020   Primary osteoarthritis involving multiple joints 12/31/2018   Rheumatoid arthritis involving multiple sites with positive rheumatoid factor ([place]) 12/31/2018   Irritable bowel syndrome with both constipation and diarrhea 10/28/2018   Constipation 03/14/2016   Mitral valve prolapse 11/23/2015   High cholesterol 11/23/2015   Past Medical History:  Diagnosis Date   Anemia    Anxiety    Arthritis    RA   Cancer (HCC)    Skin    CHF (congestive heart failure) (HCC)    Collagen vascular disease (HCC)    Depression    Endometriosis    High cholesterol    IBS (irritable bowel syndrome)    Lymphadenopathy    Mitral valve prolapse    RA (rheumatoid arthritis) (HCC)    Rotator cuff tear    bilateral   Uterus, adenomyosis     Family History  Problem Relation Age of Onset   Colon cancer Other    Bone cancer Mother    Brain cancer Father    Brain cancer Sister    Breast cancer Sister    Renal Disease Sister    Pancreatic cancer Neg Hx    Esophageal cancer Neg Hx    Stomach cancer Neg Hx    Rectal cancer Neg Hx     Past Surgical History:  Procedure Laterality Date   AXILLARY LYMPH NODE BIOPSY Right 10/12/2020   Procedure: RIGHT AXILLARY LYMPH NODE BIOPSY EXCISION;  Surgeon: Stark Klein, MD;  Location: [facility];  Service: General;  Laterality: Right;   BUNIONECTOMY Right    CARPAL TUNNEL RELEASE Right    COLONOSCOPY     COLONOSCOPY N/A 12/13/2015   Procedure: COLONOSCOPY;  Surgeon: Rogene Houston, MD;  [facility];  Service: Endoscopy;  Laterality: N/A;  9:55   Fatty tumor     2011 (left arm)   FOOT SURGERY     hammertoe   TONSILLECTOMY     TOTAL ABDOMINAL HYSTERECTOMY     2003   Social History   Occupational History   Occupation: retired  Tobacco Use   Smoking status: Some Days    Packs/day: 0.50    Years: 33.00    Total pack years: 16.50    Types: Cigarettes    Last attempt to quit: 2013    Years since quitting: 10.7   Smokeless tobacco: Never  Vaping Use   Vaping Use: Never used  Substance and Sexual Activity   Alcohol use: No    Alcohol/week: 0.0 standard drinks of alcohol   Drug use: No   Sexual activity: Not Currently

## 2022-06-28 ENCOUNTER — Ambulatory Visit: Payer: Medicare HMO | Attending: Internal Medicine | Admitting: Internal Medicine

## 2022-06-28 ENCOUNTER — Encounter: Payer: Self-pay | Admitting: Internal Medicine

## 2022-06-28 ENCOUNTER — Other Ambulatory Visit (HOSPITAL_COMMUNITY): Payer: Self-pay

## 2022-06-28 VITALS — BP 89/58 | HR 105 | Resp 16 | Ht 60.0 in | Wt 74.4 lb

## 2022-06-28 DIAGNOSIS — M159 Polyosteoarthritis, unspecified: Secondary | ICD-10-CM

## 2022-06-28 DIAGNOSIS — M0579 Rheumatoid arthritis with rheumatoid factor of multiple sites without organ or systems involvement: Secondary | ICD-10-CM

## 2022-06-28 DIAGNOSIS — R5382 Chronic fatigue, unspecified: Secondary | ICD-10-CM

## 2022-06-28 DIAGNOSIS — M546 Pain in thoracic spine: Secondary | ICD-10-CM

## 2022-06-28 DIAGNOSIS — K582 Mixed irritable bowel syndrome: Secondary | ICD-10-CM

## 2022-06-28 DIAGNOSIS — Z79899 Other long term (current) drug therapy: Secondary | ICD-10-CM

## 2022-06-28 DIAGNOSIS — M810 Age-related osteoporosis without current pathological fracture: Secondary | ICD-10-CM | POA: Diagnosis not present

## 2022-06-28 DIAGNOSIS — E559 Vitamin D deficiency, unspecified: Secondary | ICD-10-CM

## 2022-06-28 MED ORDER — PREDNISONE 5 MG PO TABS: 5.0000 mg | ORAL_TABLET | Freq: Every day | ORAL | 1 refills | Status: DC

## 2022-06-28 MED ORDER — CYCLOBENZAPRINE HCL 5 MG PO TABS: 5.0000 mg | ORAL_TABLET | Freq: Every evening | ORAL | 0 refills | Status: DC | PRN

## 2022-06-29 ENCOUNTER — Other Ambulatory Visit (HOSPITAL_COMMUNITY): Payer: Self-pay

## 2022-07-03 ENCOUNTER — Other Ambulatory Visit (HOSPITAL_COMMUNITY): Payer: Self-pay

## 2022-07-04 ENCOUNTER — Telehealth: Payer: Self-pay | Admitting: Orthopedic Surgery

## 2022-07-04 ENCOUNTER — Telehealth: Payer: Self-pay | Admitting: Psychiatry

## 2022-07-04 MED ORDER — PREGABALIN 25 MG PO CAPS: 25.0000 mg | ORAL_CAPSULE | Freq: Two times a day (BID) | ORAL | 5 refills | Status: DC

## 2022-07-04 NOTE — Telephone Encounter (Signed)
Pt called. Stated she would like to know if medication pregabalin (LYRICA) 50 MG capsule comes in a lower dose. States if it does please sent to the pharmacy [facility] [phone]. Pt is requesting a call nack from the nurse.

## 2022-07-04 NOTE — Telephone Encounter (Signed)
Contacted pt back with Dr Georgina Peer recommendations, she was appreciative.

## 2022-07-04 NOTE — Addendum Note (Signed)
Addended by: Genia Harold on: 07/04/2022 12:57 PM   Modules accepted: Orders

## 2022-07-04 NOTE — Telephone Encounter (Signed)
Contacted pt back, she stated the medication is working and she'd like to stay on it at bedtime but state it does put her out. She was wanting something  she could take during the day for pain but doesn't make her drowsy. Would you like to lower the dose so she can take in the afternoon for pain as well or try different medication. ?

## 2022-07-04 NOTE — Telephone Encounter (Signed)
I called and sw pt to advise that per Dr. Jess Barters last dictation pt will need to have follo wup with xrays and we will do them here in the office. Pt wanted to make an appt for this week as she states that she is in pain and appt made for 2:30 tomorrow. Dr. Sharol Given will come into the clinic after surgery to see the pt.

## 2022-07-04 NOTE — Telephone Encounter (Signed)
Pt called requesting a call back from Autumn F or [place]. Pt states she has an upcoming appt and asking if she need to get any testing done before her upcoming appt. Explained to pt if any further testing need to be done Dr Sharol Given would order it when she is seen. Pt states she want to get a head start if need MRI ot CT. Please call pt about this matter at [phone].

## 2022-07-04 NOTE — Telephone Encounter (Signed)
I sent an rx for 25 mg capsules. I sent in enough for her to take 25 mg twice a day.

## 2022-07-05 ENCOUNTER — Ambulatory Visit: Payer: Self-pay

## 2022-07-05 ENCOUNTER — Ambulatory Visit (INDEPENDENT_AMBULATORY_CARE_PROVIDER_SITE_OTHER): Payer: Medicare HMO | Admitting: Family

## 2022-07-05 DIAGNOSIS — M79671 Pain in right foot: Secondary | ICD-10-CM

## 2022-07-05 DIAGNOSIS — M79672 Pain in left foot: Secondary | ICD-10-CM

## 2022-07-11 ENCOUNTER — Ambulatory Visit: Payer: Medicare HMO | Admitting: Orthopedic Surgery

## 2022-07-18 ENCOUNTER — Telehealth: Payer: Self-pay | Admitting: Orthopedic Surgery

## 2022-07-18 NOTE — Telephone Encounter (Signed)
She recently saw [name] to discuss surgery. Did [name] send you anything for scheduling surgery? Thank you.

## 2022-07-18 NOTE — Telephone Encounter (Signed)
Patient wants to schedule surgery for her foot , patient states that she has been waiting on someone to call her in reference to scheduling or arranging her surgery. Is wanting to speak to the nurse. Pt states that PA was supposed to call her and she has not received any call x 3 weeks.

## 2022-07-25 ENCOUNTER — Other Ambulatory Visit (HOSPITAL_COMMUNITY): Payer: Self-pay

## 2022-07-27 ENCOUNTER — Other Ambulatory Visit (HOSPITAL_COMMUNITY): Payer: Self-pay

## 2022-07-27 NOTE — Telephone Encounter (Signed)
I spoke with [name].  She states that she last saw [name] and there was a recommendation for surgery.  I advised her that I do not have a surgery sheet/order for [name].  She has an appointment scheduled with Dr. Sharol Given on Tuesday 08/01/2022 to discuss the surgery.  I advised her that we would speak then and schedule surgery.

## 2022-07-28 ENCOUNTER — Other Ambulatory Visit (HOSPITAL_COMMUNITY): Payer: Self-pay

## 2022-08-01 ENCOUNTER — Ambulatory Visit (INDEPENDENT_AMBULATORY_CARE_PROVIDER_SITE_OTHER): Payer: Medicare HMO | Admitting: Orthopedic Surgery

## 2022-08-01 DIAGNOSIS — M79672 Pain in left foot: Secondary | ICD-10-CM

## 2022-08-01 DIAGNOSIS — M79671 Pain in right foot: Secondary | ICD-10-CM | POA: Diagnosis not present

## 2022-08-02 ENCOUNTER — Ambulatory Visit (HOSPITAL_COMMUNITY): Payer: Medicare HMO | Attending: Physical Therapy | Admitting: Physical Therapy

## 2022-08-02 NOTE — Progress Notes (Signed)
Office Visit Note   Patient: Vanessa Lara           Date of Birth: Jul 22, 1956           MRN: [id number] Visit Date: 07/05/2022              Requested by: Pablo Lawrence, NP [place],  [place] [address] PCP: Pablo Lawrence, NP  Chief Complaint  Patient presents with   Right Foot - Follow-up   Left Foot - Follow-up      HPI: The patient is a 66 year old woman who comes in today in follow-up for ongoing issues with bilateral foot pain.  She has previously had hammertoe and bunion repair by podiatry on the right foot.  Reports pain in the right foot which is worse on the left she is hopeful she can have bilateral foot surgery at the same time  Assessment & Plan: Visit Diagnoses:  1. Bilateral foot pain     Plan: Will discuss with Dr. Sharol Given, possible surgical recommendations  Follow-Up Instructions: No follow-ups on file.   Ortho Exam  Patient is alert, oriented, no adenopathy, well-dressed, normal affect, normal respiratory effort. On examination of the right foot there is no edema no erythema she does have a palpable dorsalis pedis pulse.  The foot dorsiflexion past neutral  Imaging: No results found. No images are attached to the encounter.  Labs: Lab Results  Component Value Date   HGBA1C 5.7 (H) 03/19/2020   HGBA1C 5.5 11/06/2019   ESRSEDRATE 2 05/18/2022   ESRSEDRATE 6 02/07/2022   ESRSEDRATE 63 (H) 12/05/2021   CRP 32.9 (H) 12/05/2021   CRP 1.7 (H) 08/23/2020   CRP 2.0 05/15/2019     Lab Results  Component Value Date   ALBUMIN 3.6 08/23/2020    No results found for: "MG" Lab Results  Component Value Date   VD25OH 62 05/18/2022   VD25OH 59.47 01/05/2022   VD25OH 33.7 05/27/2021    No results found for: "PREALBUMIN"    Latest Ref Rng & Units 05/18/2022   10:25 AM 02/07/2022    1:39 PM 01/05/2022    1:35 PM  CBC EXTENDED  WBC 3.8 - 10.8 Thousand/uL 6.9  8.9  9.6   RBC 3.80 - 5.10 Million/uL 4.06  4.64  4.47    Hemoglobin 11.7 - 15.5 g/dL 12.5  13.3  12.6   HCT 35.0 - 45.0 % 37.3  40.9  40.0   Platelets 140 - 400 Thousand/uL 213  236  320   NEUT# 1,500 - 7,800 cells/uL 3,491  5,144  5.3   Lymph# 850 - 3,900 cells/uL 2,498  2,875  3.3      There is no height or weight on file to calculate BMI.  Orders:  Orders Placed This Encounter  Procedures   XR Foot Complete Right   XR Foot Complete Left   No orders of the defined types were placed in this encounter.    Procedures: No procedures performed  Clinical Data: No additional findings.  ROS:  All other systems negative, except as noted in the HPI. Review of Systems  Objective: Vital Signs: There were no vitals taken for this visit.  Specialty Comments:  MRI CERVICAL SPINE WITHOUT CONTRAST   TECHNIQUE: Multiplanar, multisequence MR imaging of the cervical spine was performed. No intravenous contrast was administered.   COMPARISON:  None.   FINDINGS: Alignment: 2 mm retrolisthesis of C5 on C6.   Vertebrae: No acute fracture,  evidence of discitis, or bone lesion.   Cord: Normal signal and morphology.   Posterior Fossa, vertebral arteries, paraspinal tissues: Posterior fossa demonstrates no focal abnormality. Vertebral artery flow voids are maintained. Paraspinal soft tissues are unremarkable.   Disc levels:   Discs: Degenerative disease with disc height loss at C4-5 and C5-6.   C2-3: No significant disc bulge. No neural foraminal stenosis. No central canal stenosis.   C3-4: Mild broad-based disc bulge. No foraminal or central canal stenosis.   C4-5: Mild broad-based disc bulge. No foraminal or central canal stenosis.   C5-6: Mild broad-based disc bulge. Bilateral uncovertebral degenerative changes. Moderate left and severe right foraminal stenosis. No spinal stenosis.   C6-7: Mild broad-based disc bulge. No foraminal or central canal stenosis.   C7-T1: No significant disc bulge. No neural foraminal  stenosis. No central canal stenosis.   IMPRESSION: 1. At C5-6 there is a mild broad-based disc bulge. Bilateral uncovertebral degenerative changes. Moderate left and severe right foraminal stenosis. 2. Otherwise, mild cervical spine spondylosis as described above.     Electronically Signed   By: Kathreen Devoid M.D.   On: 12/16/2021 10:51  PMFS History: Patient Active Problem List   Diagnosis Date Noted   Medication monitoring encounter 12/05/2021   Physical deconditioning 11/11/2021   Elevated serum lactate dehydrogenase (LDH) 08/17/2021   Vitamin D deficiency 08/17/2021   Chronic fatigue 07/25/2021   Vitamin B12 deficiency 07/19/2021   Hardening of the aorta (main artery of the heart) ([place]) 06/21/2021   Solitary lung nodule 06/15/2021   Loss of weight 05/18/2021   Protein-calorie malnutrition, severe ([place]) 05/18/2021   Osteoporosis 06/15/2020   Iron deficiency anemia 04/09/2020   Primary osteoarthritis involving multiple joints 12/31/2018   Rheumatoid arthritis involving multiple sites with positive rheumatoid factor ([place]) 12/31/2018   Irritable bowel syndrome with both constipation and diarrhea 10/28/2018   Constipation 03/14/2016   Mitral valve prolapse 11/23/2015   High cholesterol 11/23/2015   Past Medical History:  Diagnosis Date   Anemia    Anxiety    Arthritis    RA   Cancer (HCC)    Skin   CHF (congestive heart failure) (HCC)    Collagen vascular disease (HCC)    Depression    Endometriosis    High cholesterol    IBS (irritable bowel syndrome)    Lymphadenopathy    Mitral valve prolapse    RA (rheumatoid arthritis) (HCC)    Rotator cuff tear    bilateral   Uterus, adenomyosis     Family History  Problem Relation Age of Onset   Colon cancer Other    Bone cancer Mother    Brain cancer Father    Brain cancer Sister    Breast cancer Sister    Renal Disease Sister    Pancreatic cancer Neg Hx    Esophageal cancer Neg Hx    Stomach cancer Neg Hx     Rectal cancer Neg Hx     Past Surgical History:  Procedure Laterality Date   AXILLARY LYMPH NODE BIOPSY Right 10/12/2020   Procedure: RIGHT AXILLARY LYMPH NODE BIOPSY EXCISION;  Surgeon: Stark Klein, MD;  Location: [facility];  Service: General;  Laterality: Right;   BUNIONECTOMY Right    CARPAL TUNNEL RELEASE Right    COLONOSCOPY     COLONOSCOPY N/A 12/13/2015   Procedure: COLONOSCOPY;  Surgeon: Rogene Houston, MD;  [facility];  Service: Endoscopy;  Laterality: N/A;  9:55   Fatty tumor  2011 (left arm)   FOOT SURGERY     hammertoe   TONSILLECTOMY     TOTAL ABDOMINAL HYSTERECTOMY     2003   Social History   Occupational History   Occupation: retired  Tobacco Use   Smoking status: Some Days    Packs/day: 0.50    Years: 33.00    Total pack years: 16.50    Types: Cigarettes    Last attempt to quit: 2013    Years since quitting: 10.8   Smokeless tobacco: Never  Vaping Use   Vaping Use: Never used  Substance and Sexual Activity   Alcohol use: No    Alcohol/week: 0.0 standard drinks of alcohol   Drug use: No   Sexual activity: Not Currently

## 2022-08-03 NOTE — Progress Notes (Signed)
   CC:  headaches  Follow-up Visit  Last visit: 12/27/21  Brief HPI: 66 year old female with a history of Rheumatoid arthritis, B12 deficiency, HLD who follows in clinic for daily left-sided headaches. CTH was normal and MRI C-spine showed moderate left and severe right foraminal stenosis at C5-6.  At her last visit she was started on an indomethacin trial to rule out hemicrania continua.  Interval History: She did not have improvement with indomethacin so her gabapentin was increased and she underwent occipital nerve block on 03/02/22. This did not give her much improvement. Higher dose of gabapentin made her dizzy, so it was switched to Lyrica 25 mg BID. This has helped reduce the severity of her headaches. However she still has intermittent left-sided headaches with photophobia, nausea, and spots in her eyes. She is taking Flexeril as needed, which helps take the edge off.   She is continuing to have neck pain. She did have steroid injections in her neck in April 2023 but her pain continues. She's having pain in her lower back and hips as well. She was referred to physical therapy but she thinks she is in too much pain to be able to tolerate this.  Current Headache Regimen: Preventative: Lyrica 25 mg BID Abortive: none  Migraine days per month: 15 Headache free days per month: 15  Prior Therapies                                  Prevention: Indomethacin (up to 75 mg TID) - lack of efficacy Gabapentin - dizziness Lyrica 25 mg BID - helps, drowsiness at higher doses Paxil Metoprolol Cymbalta 20 mg daily  Rescue: Tramadol - drowsiness Tizanidine Baclofen Meloxicam  Physical Exam:   Vital Signs: BP 103/61   Pulse (!) 106   Ht 5' (1.524 m)   Wt 72 lb 3.2 oz (32.7 kg)   BMI 14.10 kg/m  GENERAL:  well appearing, in no acute distress, alert  SKIN:  Color, texture, turgor normal. No rashes or lesions HEAD:  Normocephalic/atraumatic. RESP: normal respiratory effort MSK:  No  gross joint deformities.   NEUROLOGICAL: Mental Status: Alert, oriented to person, place and time, Follows commands, and Speech fluent and appropriate. Cranial Nerves: PERRL, face symmetric, no dysarthria, hearing grossly intact Motor: moves all extremities equally Gait: normal-based.  IMPRESSION: 66 year old female with a history of Rheumatoid arthritis, B12 deficiency, HLD who presents for follow up of left sided headaches. Will order brain MRI for persistent unilateral headaches despite treatment. Her daily headaches have improved somewhat with Lyrica, however she now has headaches with migrainous features including photophobia, nausea, and spots in her vision. Will start naratriptan for migraine rescue. She has failed multiple preventive medications due to side effects. Will start Botox for migraine prevention. This may also help her neck pain.  PLAN: -Rescue: Start naratriptan 2.5 mg PRN -Prevention: Continue Lyrica 25 mg BID. Start Botox for migraine prevention -Encouraged her to schedule physical therapy for her back pain  Follow-up: for Botox  I spent a total of 32 minutes on the date of the service. Headache education was done. Discussed treatment options including preventive and acute medications, and physical therapy. Discussed medication side effects, adverse reactions and drug interactions. Written educational materials and patient instructions outlining all of the above were given.  Genia Harold, MD 08/07/22 3:14 PM

## 2022-08-05 ENCOUNTER — Other Ambulatory Visit (HOSPITAL_COMMUNITY): Payer: Self-pay

## 2022-08-07 ENCOUNTER — Encounter: Payer: Self-pay | Admitting: Psychiatry

## 2022-08-07 ENCOUNTER — Telehealth: Payer: Self-pay | Admitting: Orthopedic Surgery

## 2022-08-07 ENCOUNTER — Ambulatory Visit (INDEPENDENT_AMBULATORY_CARE_PROVIDER_SITE_OTHER): Payer: Medicare HMO | Admitting: Psychiatry

## 2022-08-07 VITALS — BP 103/61 | HR 106 | Ht 60.0 in | Wt 72.2 lb

## 2022-08-07 DIAGNOSIS — R519 Headache, unspecified: Secondary | ICD-10-CM | POA: Diagnosis not present

## 2022-08-07 MED ORDER — NARATRIPTAN HCL 2.5 MG PO TABS: 2.5000 mg | ORAL_TABLET | ORAL | 6 refills | Status: DC | PRN

## 2022-08-07 NOTE — Telephone Encounter (Signed)
Note has not been dictated by Dr. Sharol Given, once it is, she can view on her mychart.

## 2022-08-07 NOTE — Patient Instructions (Addendum)
Start naratriptan ([place]merge) as needed for migraines. Take one pill at onset of migraines. May repeat a dose in 4 hours if needed. Max dose 2 pills in 24 hours  Start Botox for migraine prevention. We will run this through your insurance and call you when it is approved  MRI of the brain

## 2022-08-07 NOTE — Telephone Encounter (Signed)
Pt called requesting after summary visit be sent to her mychart after her last visit. Pt states she is unable to see any of Dr Sharol Given note on there as she always go on there and look. Pt phone number is [phone].

## 2022-08-08 ENCOUNTER — Ambulatory Visit: Payer: Medicare HMO | Admitting: Sports Medicine

## 2022-08-08 ENCOUNTER — Telehealth: Payer: Self-pay | Admitting: Neurology

## 2022-08-08 NOTE — Telephone Encounter (Signed)
Pt is a new start with botox and needs a prior authorization completed. Will send to the pharmacy team to complete  Botox- J0585 CPT code- [phone] ICD Code- Z12.508

## 2022-08-09 ENCOUNTER — Other Ambulatory Visit (HOSPITAL_COMMUNITY): Payer: Self-pay

## 2022-08-09 NOTE — Telephone Encounter (Signed)
Patient Advocate Encounter   Received notificationthat prior authorization for Botox 200UNIT solution is required.   PA submitted on 08/09/2022 Key [id number] Status is pending       Lyndel Safe, [place] Patient Advocate Specialist [place] Patient Advocate Team Direct Number: [phone]  Fax: [phone]

## 2022-08-09 NOTE — Telephone Encounter (Signed)
BotoxOne benefit verification submitted   Key  [id number]

## 2022-08-10 NOTE — Telephone Encounter (Signed)
It's okay to schedule her with an NP, thanks

## 2022-08-10 NOTE — Telephone Encounter (Signed)
Patient Advocate Encounter  Prior Authorization for Botox 200UNIT solution has been approved.    PA# [id number] Effective dates: 08/09/2022 through 10/02/2023  Buy and Elton Sin, [place] Patient Advocate Specialist [place] Patient Advocate Team Direct Number: [phone]  Fax: [phone]

## 2022-08-10 NOTE — Telephone Encounter (Signed)
Pt can now be scheduled with Dr Billey Gosling for botox. She is Human resources officer.

## 2022-08-10 NOTE — Telephone Encounter (Signed)
Ok to schedule with NP as long as Dr Billey Gosling agrees. I wasn't sure since it was the patient's first time if it needed to be with MD.

## 2022-08-10 NOTE — Telephone Encounter (Signed)
Dr. Billey Gosling is currently booking out into February for botox, okay to schedule with NP or should we use a new patient slot with Dr. Billey Gosling?

## 2022-08-11 NOTE — Telephone Encounter (Signed)
Patient called in again stating ASV notes are still not completed and she needs them Sent to her at her home address

## 2022-08-14 ENCOUNTER — Ambulatory Visit: Payer: Medicare HMO | Admitting: Sports Medicine

## 2022-08-14 NOTE — Telephone Encounter (Signed)
I will once they are completed for review.

## 2022-08-15 ENCOUNTER — Ambulatory Visit: Payer: Medicare HMO | Admitting: Internal Medicine

## 2022-08-17 ENCOUNTER — Other Ambulatory Visit (HOSPITAL_COMMUNITY): Payer: Self-pay

## 2022-08-17 ENCOUNTER — Ambulatory Visit: Payer: Self-pay

## 2022-08-17 ENCOUNTER — Encounter: Payer: Self-pay | Admitting: Sports Medicine

## 2022-08-17 ENCOUNTER — Ambulatory Visit (INDEPENDENT_AMBULATORY_CARE_PROVIDER_SITE_OTHER): Payer: Medicare HMO | Admitting: Sports Medicine

## 2022-08-17 VITALS — Wt 74.0 lb

## 2022-08-17 DIAGNOSIS — G8929 Other chronic pain: Secondary | ICD-10-CM

## 2022-08-17 DIAGNOSIS — M533 Sacrococcygeal disorders, not elsewhere classified: Secondary | ICD-10-CM

## 2022-08-17 DIAGNOSIS — M545 Low back pain, unspecified: Secondary | ICD-10-CM | POA: Diagnosis not present

## 2022-08-17 MED ORDER — FORTEO 600 MCG/2.4ML ~~LOC~~ SOPN
PEN_INJECTOR | SUBCUTANEOUS | 5 refills | Status: DC
  Filled 2022-08-17: qty 2.4, 28d supply, fill #0
  Filled 2022-09-13 – 2022-09-15 (×2): qty 2.4, 28d supply, fill #1
  Filled 2022-11-28: qty 2.4, 28d supply, fill #2
  Filled 2023-01-18 – 2023-01-25 (×2): qty 2.4, 28d supply, fill #3
  Filled 2023-02-27: qty 2.4, 28d supply, fill #4
  Filled 2023-06-26: qty 2.4, 28d supply, fill #5

## 2022-08-17 NOTE — Progress Notes (Signed)
   Procedure Note  Patient: Vanessa Lara             Date of Birth: 07-25-1956           MRN: [id number]             Visit Date: 08/17/2022  Procedures: Visit Diagnoses:  1. Low back pain, unspecified back pain laterality, unspecified chronicity, unspecified whether sciatica present    U/S-guided SI-joint injection, right   After discussion of risk/benefits/indications, informed verbal consent was obtained. A timeout was then performed. The patient was positioned in a prone position on exam room table with a pillow placed under the pelvis for mild hip flexion. The SI joint area was cleaned and prepped with betadine and alcohol swabs. Sterile ultrasound gel was applied and the ultrasound transducer was placed in an anatomic axial plane over the PSIS, then moved distally over the SI-joint. Using ultrasound guidance, a 22-gauge, 3.5" needle was inserted from a medial to lateral approach utilizing an in-plane approach and directed into the SI-joint. The SI-joint was then injected with a mixture of 4:1 lidocaine:depomedrol with visualization of the injectate flow into the SI-joint under ultrasound visualization. The patient tolerated the procedure well without immediate complications.  - I evaluated the patient about 10 minutes post-injection and she had excellent improvement in pain and range of motion - follow-up with Lurena Joiner as indicated; I am happy to see them as needed  Elba Barman, DO [place]  This note was dictated using Dragon naturally speaking software and may contain errors in syntax, spelling, or content which have not been identified prior to signing this note.

## 2022-08-21 ENCOUNTER — Telehealth: Payer: Self-pay | Admitting: Psychiatry

## 2022-08-21 NOTE — Telephone Encounter (Signed)
Correct phone number to call for scheduling: [phone]

## 2022-08-21 NOTE — Telephone Encounter (Signed)
Mcarthur Rossetti Josem Kaufmann: [phone] exp. 08/21/22-09/20/22 [place] medicaid NPR sent to [facility] [phone]

## 2022-08-22 ENCOUNTER — Other Ambulatory Visit (HOSPITAL_COMMUNITY): Payer: Self-pay

## 2022-08-29 ENCOUNTER — Encounter: Payer: Self-pay | Admitting: Orthopedic Surgery

## 2022-08-29 NOTE — Progress Notes (Signed)
Office Visit Note   Patient: Vanessa Lara           Date of Birth: 12-25-1955           MRN: [id number] Visit Date: 08/01/2022              Requested by: Pablo Lawrence, NP [place],  [place] [address] PCP: Pablo Lawrence, NP  Chief Complaint  Patient presents with   Right Foot - Pain   Left Foot - Pain      HPI: Patient is a 66 year old woman with bilateral foot pain.  She has undergone previous bunion surgeries.  On her right foot she has had a chevron and Akin osteotomy.  On her left foot she has dislocation of the lesser toes.  Assessment & Plan: Visit Diagnoses:  1. Bilateral foot pain     Plan: Discussed with the patient on the right foot she would need a fusion of the MTP joint of the great toe and Weil osteotomy of the second third and fourth metatarsal with PIP fusion of the second third and fourth toes.  On the left foot all lesser toes are dislocated at the MTP joint and she would want to focus on the right foot first.  Plan to follow-up in the office in 2 months.  Discussed that patient would need assistance at home she currently lives alone and would need assistance postoperatively.  She would also need to wean off her current pain medications so that pain medicine would work after surgery.  Follow-Up Instructions: Return in about 2 months (around 10/01/2022).   Ortho Exam  Patient is alert, oriented, no adenopathy, well-dressed, normal affect, normal respiratory effort. Examination patient has a palpable pulse bilaterally.  Examination of the left foot she has clawing of the lesser toes with dislocation of the MTP joints.  Examination of the right foot she has a dislocation of the great toe MTP joint with retained hardware from an [place] and chevron osteotomy.  Patient also has dislocations of the lesser toes.  She has fixed deformity of the PIP joints of the lesser toes.  Imaging: No results found. No images are attached to the  encounter.  Labs: Lab Results  Component Value Date   HGBA1C 5.7 (H) 03/19/2020   HGBA1C 5.5 11/06/2019   ESRSEDRATE 2 05/18/2022   ESRSEDRATE 6 02/07/2022   ESRSEDRATE 63 (H) 12/05/2021   CRP 32.9 (H) 12/05/2021   CRP 1.7 (H) 08/23/2020   CRP 2.0 05/15/2019     Lab Results  Component Value Date   ALBUMIN 3.6 08/23/2020    No results found for: "MG" Lab Results  Component Value Date   VD25OH 62 05/18/2022   VD25OH 59.47 01/05/2022   VD25OH 33.7 05/27/2021    No results found for: "PREALBUMIN"    Latest Ref Rng & Units 05/18/2022   10:25 AM 02/07/2022    1:39 PM 01/05/2022    1:35 PM  CBC EXTENDED  WBC 3.8 - 10.8 Thousand/uL 6.9  8.9  9.6   RBC 3.80 - 5.10 Million/uL 4.06  4.64  4.47   Hemoglobin 11.7 - 15.5 g/dL 12.5  13.3  12.6   HCT 35.0 - 45.0 % 37.3  40.9  40.0   Platelets 140 - 400 Thousand/uL 213  236  320   NEUT# 1,500 - 7,800 cells/uL 3,491  5,144  5.3   Lymph# 850 - 3,900 cells/uL 2,498  2,875  3.3  There is no height or weight on file to calculate BMI.  Orders:  No orders of the defined types were placed in this encounter.  No orders of the defined types were placed in this encounter.    Procedures: No procedures performed  Clinical Data: No additional findings.  ROS:  All other systems negative, except as noted in the HPI. Review of Systems  Objective: Vital Signs: There were no vitals taken for this visit.  Specialty Comments:  MRI CERVICAL SPINE WITHOUT CONTRAST   TECHNIQUE: Multiplanar, multisequence MR imaging of the cervical spine was performed. No intravenous contrast was administered.   COMPARISON:  None.   FINDINGS: Alignment: 2 mm retrolisthesis of C5 on C6.   Vertebrae: No acute fracture, evidence of discitis, or bone lesion.   Cord: Normal signal and morphology.   Posterior Fossa, vertebral arteries, paraspinal tissues: Posterior fossa demonstrates no focal abnormality. Vertebral artery flow voids are  maintained. Paraspinal soft tissues are unremarkable.   Disc levels:   Discs: Degenerative disease with disc height loss at C4-5 and C5-6.   C2-3: No significant disc bulge. No neural foraminal stenosis. No central canal stenosis.   C3-4: Mild broad-based disc bulge. No foraminal or central canal stenosis.   C4-5: Mild broad-based disc bulge. No foraminal or central canal stenosis.   C5-6: Mild broad-based disc bulge. Bilateral uncovertebral degenerative changes. Moderate left and severe right foraminal stenosis. No spinal stenosis.   C6-7: Mild broad-based disc bulge. No foraminal or central canal stenosis.   C7-T1: No significant disc bulge. No neural foraminal stenosis. No central canal stenosis.   IMPRESSION: 1. At C5-6 there is a mild broad-based disc bulge. Bilateral uncovertebral degenerative changes. Moderate left and severe right foraminal stenosis. 2. Otherwise, mild cervical spine spondylosis as described above.     Electronically Signed   By: Kathreen Devoid M.D.   On: 12/16/2021 10:51  PMFS History: Patient Active Problem List   Diagnosis Date Noted   Medication monitoring encounter 12/05/2021   Physical deconditioning 11/11/2021   Elevated serum lactate dehydrogenase (LDH) 08/17/2021   Vitamin D deficiency 08/17/2021   Chronic fatigue 07/25/2021   Vitamin B12 deficiency 07/19/2021   Hardening of the aorta (main artery of the heart) ([place]) 06/21/2021   Solitary lung nodule 06/15/2021   Loss of weight 05/18/2021   Protein-calorie malnutrition, severe ([place]) 05/18/2021   Osteoporosis 06/15/2020   Iron deficiency anemia 04/09/2020   Primary osteoarthritis involving multiple joints 12/31/2018   Rheumatoid arthritis involving multiple sites with positive rheumatoid factor ([place]) 12/31/2018   Irritable bowel syndrome with both constipation and diarrhea 10/28/2018   Constipation 03/14/2016   Mitral valve prolapse 11/23/2015   High cholesterol 11/23/2015    Past Medical History:  Diagnosis Date   Anemia    Anxiety    Arthritis    RA   Cancer (HCC)    Skin   CHF (congestive heart failure) (HCC)    Collagen vascular disease (HCC)    Depression    Endometriosis    High cholesterol    IBS (irritable bowel syndrome)    Lymphadenopathy    Mitral valve prolapse    RA (rheumatoid arthritis) (HCC)    Rotator cuff tear    bilateral   Uterus, adenomyosis     Family History  Problem Relation Age of Onset   Colon cancer Other    Bone cancer Mother    Brain cancer Father    Brain cancer Sister    Breast cancer Sister  Renal Disease Sister    Pancreatic cancer Neg Hx    Esophageal cancer Neg Hx    Stomach cancer Neg Hx    Rectal cancer Neg Hx     Past Surgical History:  Procedure Laterality Date   AXILLARY LYMPH NODE BIOPSY Right 10/12/2020   Procedure: RIGHT AXILLARY LYMPH NODE BIOPSY EXCISION;  Surgeon: Stark Klein, MD;  Location: [facility];  Service: General;  Laterality: Right;   BUNIONECTOMY Right    CARPAL TUNNEL RELEASE Right    COLONOSCOPY     COLONOSCOPY N/A 12/13/2015   Procedure: COLONOSCOPY;  Surgeon: Rogene Houston, MD;  [facility];  Service: Endoscopy;  Laterality: N/A;  9:55   Fatty tumor     2011 (left arm)   FOOT SURGERY     hammertoe   TONSILLECTOMY     TOTAL ABDOMINAL HYSTERECTOMY     2003   Social History   Occupational History   Occupation: retired  Tobacco Use   Smoking status: Some Days    Packs/day: 0.50    Years: 33.00    Total pack years: 16.50    Types: Cigarettes    Last attempt to quit: 2013    Years since quitting: 10.9   Smokeless tobacco: Never  Vaping Use   Vaping Use: Never used  Substance and Sexual Activity   Alcohol use: No    Alcohol/week: 0.0 standard drinks of alcohol   Drug use: No   Sexual activity: Not Currently

## 2022-09-07 ENCOUNTER — Ambulatory Visit (INDEPENDENT_AMBULATORY_CARE_PROVIDER_SITE_OTHER): Payer: Medicare HMO | Admitting: Orthopedic Surgery

## 2022-09-07 DIAGNOSIS — M21612 Bunion of left foot: Secondary | ICD-10-CM

## 2022-09-07 DIAGNOSIS — M205X9 Other deformities of toe(s) (acquired), unspecified foot: Secondary | ICD-10-CM | POA: Diagnosis not present

## 2022-09-07 DIAGNOSIS — M0579 Rheumatoid arthritis with rheumatoid factor of multiple sites without organ or systems involvement: Secondary | ICD-10-CM

## 2022-09-07 DIAGNOSIS — M21611 Bunion of right foot: Secondary | ICD-10-CM | POA: Diagnosis not present

## 2022-09-10 ENCOUNTER — Encounter: Payer: Self-pay | Admitting: Orthopedic Surgery

## 2022-09-10 NOTE — Progress Notes (Signed)
Office Visit Note   Patient: Vanessa Lara           Date of Birth: 1956/02/28           MRN: [id number] Visit Date: 09/07/2022              Requested by: Pablo Lawrence, NP [place],  [place] [id number] PCP: Pablo Lawrence, NP  Chief Complaint  Patient presents with   Right Foot - Pain   Left Foot - Pain      HPI: Patient is a 66 year old woman with rheumatoid arthritis with painful bunion and claw toe deformity bilaterally.  Patient states that she is in excruciating pain and wants to expedite surgery.  She complains of pain in the bilateral forefoot.  Patient states she is currently on current multiple pain medications and she states she cannot live without her 50 mg fentanyl patch.  She states she has been on this for about 2 to 3 years.  Patient states that she would have someone to stay with her after surgery and want to go to skilled nursing.  Pain is worse in the right foot greater than the left.  Assessment & Plan: Visit Diagnoses:  1. Bunion of great toe of left foot   2. Rheumatoid arthritis involving multiple sites with positive rheumatoid factor (HCC)   3. Bunion of great toe of right foot   4. Acquired claw toe, unspecified laterality     Plan: Will plan for forefoot surgery with fusion of the great toe MTP joint and a Weil osteotomy of the second third and fourth metatarsals with possible PIP resection of the second and third and fourth toes.  Discussed that patient has about a 50% to improve her function and decrease pain.  Patient states she understands wished to proceed at this time.  Again discussed the importance of weaning off pain medicine so that pain medicine would work postoperatively.  Patient states she would like to proceed with surgery as soon as possible after January 1.  Follow-Up Instructions: No follow-ups on file.   Ortho Exam  Patient is alert, oriented, no adenopathy, well-dressed, normal affect, normal respiratory  effort. Examination patient has a good dorsalis pedis pulse.  She has a dislocated MTP joint of the great toe with prominent second third and fourth metatarsals with fixed clawing of the second third and fourth toes.  Imaging: No results found. No images are attached to the encounter.  Labs: Lab Results  Component Value Date   HGBA1C 5.7 (H) 03/19/2020   HGBA1C 5.5 11/06/2019   ESRSEDRATE 2 05/18/2022   ESRSEDRATE 6 02/07/2022   ESRSEDRATE 63 (H) 12/05/2021   CRP 32.9 (H) 12/05/2021   CRP 1.7 (H) 08/23/2020   CRP 2.0 05/15/2019     Lab Results  Component Value Date   ALBUMIN 3.6 08/23/2020    No results found for: "MG" Lab Results  Component Value Date   VD25OH 62 05/18/2022   VD25OH 59.47 01/05/2022   VD25OH 33.7 05/27/2021    No results found for: "PREALBUMIN"    Latest Ref Rng & Units 05/18/2022   10:25 AM 02/07/2022    1:39 PM 01/05/2022    1:35 PM  CBC EXTENDED  WBC 3.8 - 10.8 Thousand/uL 6.9  8.9  9.6   RBC 3.80 - 5.10 Million/uL 4.06  4.64  4.47   Hemoglobin 11.7 - 15.5 g/dL 12.5  13.3  12.6   HCT 35.0 - 45.0 %  37.3  40.9  40.0   Platelets 140 - 400 Thousand/uL 213  236  320   NEUT# 1,500 - 7,800 cells/uL 3,491  5,144  5.3   Lymph# 850 - 3,900 cells/uL 2,498  2,875  3.3      There is no height or weight on file to calculate BMI.  Orders:  No orders of the defined types were placed in this encounter.  No orders of the defined types were placed in this encounter.    Procedures: No procedures performed  Clinical Data: No additional findings.  ROS:  All other systems negative, except as noted in the HPI. Review of Systems  Objective: Vital Signs: There were no vitals taken for this visit.  Specialty Comments:  MRI CERVICAL SPINE WITHOUT CONTRAST   TECHNIQUE: Multiplanar, multisequence MR imaging of the cervical spine was performed. No intravenous contrast was administered.   COMPARISON:  None.   FINDINGS: Alignment: 2 mm retrolisthesis  of C5 on C6.   Vertebrae: No acute fracture, evidence of discitis, or bone lesion.   Cord: Normal signal and morphology.   Posterior Fossa, vertebral arteries, paraspinal tissues: Posterior fossa demonstrates no focal abnormality. Vertebral artery flow voids are maintained. Paraspinal soft tissues are unremarkable.   Disc levels:   Discs: Degenerative disease with disc height loss at C4-5 and C5-6.   C2-3: No significant disc bulge. No neural foraminal stenosis. No central canal stenosis.   C3-4: Mild broad-based disc bulge. No foraminal or central canal stenosis.   C4-5: Mild broad-based disc bulge. No foraminal or central canal stenosis.   C5-6: Mild broad-based disc bulge. Bilateral uncovertebral degenerative changes. Moderate left and severe right foraminal stenosis. No spinal stenosis.   C6-7: Mild broad-based disc bulge. No foraminal or central canal stenosis.   C7-T1: No significant disc bulge. No neural foraminal stenosis. No central canal stenosis.   IMPRESSION: 1. At C5-6 there is a mild broad-based disc bulge. Bilateral uncovertebral degenerative changes. Moderate left and severe right foraminal stenosis. 2. Otherwise, mild cervical spine spondylosis as described above.     Electronically Signed   By: Kathreen Devoid M.D.   On: 12/16/2021 10:51  PMFS History: Patient Active Problem List   Diagnosis Date Noted   Medication monitoring encounter 12/05/2021   Physical deconditioning 11/11/2021   Elevated serum lactate dehydrogenase (LDH) 08/17/2021   Vitamin D deficiency 08/17/2021   Chronic fatigue 07/25/2021   Vitamin B12 deficiency 07/19/2021   Hardening of the aorta (main artery of the heart) ([place]) 06/21/2021   Solitary lung nodule 06/15/2021   Loss of weight 05/18/2021   Protein-calorie malnutrition, severe ([place]) 05/18/2021   Osteoporosis 06/15/2020   Iron deficiency anemia 04/09/2020   Primary osteoarthritis involving multiple joints 12/31/2018    Rheumatoid arthritis involving multiple sites with positive rheumatoid factor ([place]) 12/31/2018   Irritable bowel syndrome with both constipation and diarrhea 10/28/2018   Constipation 03/14/2016   Mitral valve prolapse 11/23/2015   High cholesterol 11/23/2015   Past Medical History:  Diagnosis Date   Anemia    Anxiety    Arthritis    RA   Cancer (HCC)    Skin   CHF (congestive heart failure) (HCC)    Collagen vascular disease (HCC)    Depression    Endometriosis    High cholesterol    IBS (irritable bowel syndrome)    Lymphadenopathy    Mitral valve prolapse    RA (rheumatoid arthritis) (HCC)    Rotator cuff tear    bilateral  Uterus, adenomyosis     Family History  Problem Relation Age of Onset   Colon cancer Other    Bone cancer Mother    Brain cancer Father    Brain cancer Sister    Breast cancer Sister    Renal Disease Sister    Pancreatic cancer Neg Hx    Esophageal cancer Neg Hx    Stomach cancer Neg Hx    Rectal cancer Neg Hx     Past Surgical History:  Procedure Laterality Date   AXILLARY LYMPH NODE BIOPSY Right 10/12/2020   Procedure: RIGHT AXILLARY LYMPH NODE BIOPSY EXCISION;  Surgeon: Stark Klein, MD;  Location: [facility];  Service: General;  Laterality: Right;   BUNIONECTOMY Right    CARPAL TUNNEL RELEASE Right    COLONOSCOPY     COLONOSCOPY N/A 12/13/2015   Procedure: COLONOSCOPY;  Surgeon: Rogene Houston, MD;  Location: [facility];  Service: Endoscopy;  Laterality: N/A;  9:55   Fatty tumor     2011 (left arm)   FOOT SURGERY     [place]   TONSILLECTOMY     TOTAL ABDOMINAL HYSTERECTOMY     2003   Social History   Occupational History   Occupation: retired  Tobacco Use   Smoking status: Some Days    Packs/day: 0.50    Years: 33.00    Total pack years: 16.50    Types: Cigarettes    Last attempt to quit: 2013    Years since quitting: 10.9   Smokeless tobacco: Never  Vaping Use   Vaping Use: Never used  Substance  and Sexual Activity   Alcohol use: No    Alcohol/week: 0.0 standard drinks of alcohol   Drug use: No   Sexual activity: Not Currently

## 2022-09-13 ENCOUNTER — Other Ambulatory Visit: Payer: Self-pay

## 2022-09-13 ENCOUNTER — Other Ambulatory Visit (HOSPITAL_COMMUNITY): Payer: Self-pay | Admitting: Adult Health Nurse Practitioner

## 2022-09-13 DIAGNOSIS — Z1231 Encounter for screening mammogram for malignant neoplasm of breast: Secondary | ICD-10-CM

## 2022-09-15 ENCOUNTER — Other Ambulatory Visit: Payer: Self-pay

## 2022-09-15 ENCOUNTER — Other Ambulatory Visit (HOSPITAL_COMMUNITY): Payer: Self-pay

## 2022-09-15 MED ORDER — FENTANYL 50 MCG/HR TD PT72
1.0000 | MEDICATED_PATCH | TRANSDERMAL | 0 refills | Status: DC
  Filled 2022-09-15: qty 10, 30d supply, fill #0

## 2022-09-18 ENCOUNTER — Other Ambulatory Visit: Payer: Self-pay

## 2022-09-18 ENCOUNTER — Other Ambulatory Visit (HOSPITAL_COMMUNITY): Payer: Self-pay

## 2022-09-18 IMAGING — CT CT HEAD WO/W CM
4 of 5 series · 16 of 47 positions shown, 18 images · IV contrast (agent unspecified)
Comparison: [HOSPITAL] Brain MRI 08/27/2018. Head CT
without contrast 04/30/2017.

CLINICAL DATA: 65-year-old female with thunderclap headache.

EXAM:
CT HEAD WITHOUT AND WITH CONTRAST
TECHNIQUE: Contiguous axial images were obtained from the base of the skull
through the vertex without and with intravenous contrast.

[Series 2: head wo · axial · 0.40mm/px · z∈[+1529,+1629]mm · 5 of 32 slices shown, 7 images]
[im 6/32  brain]
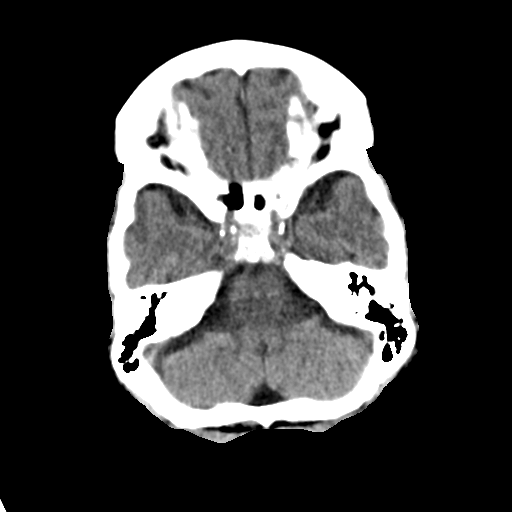
[im 6/32  bone]
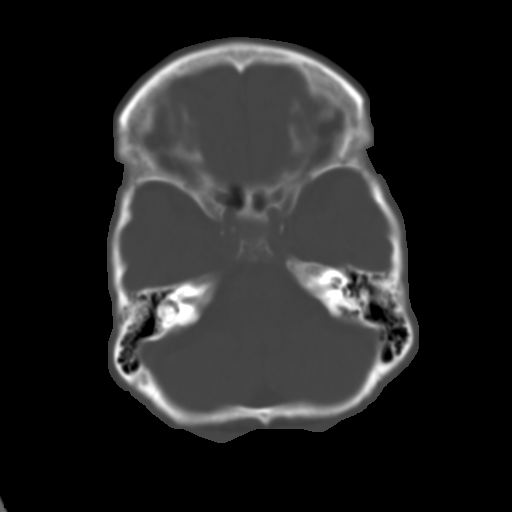
[im 11/32  brain]
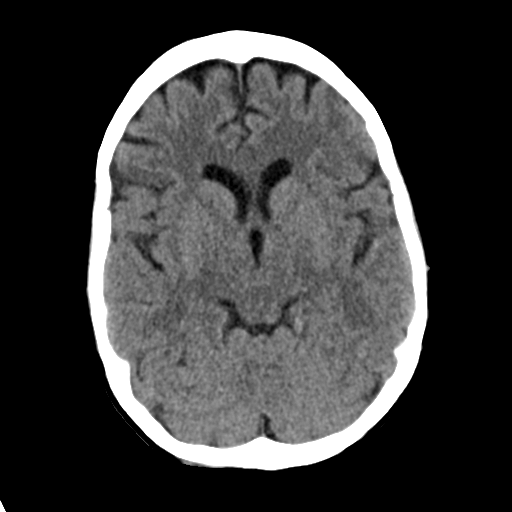
[im 16/32  brain]
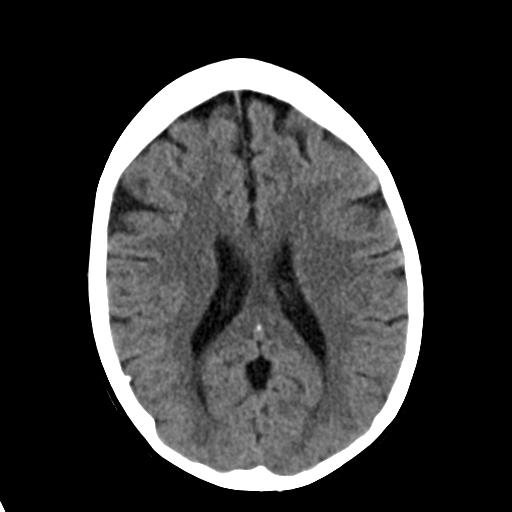
[im 21/32  brain]
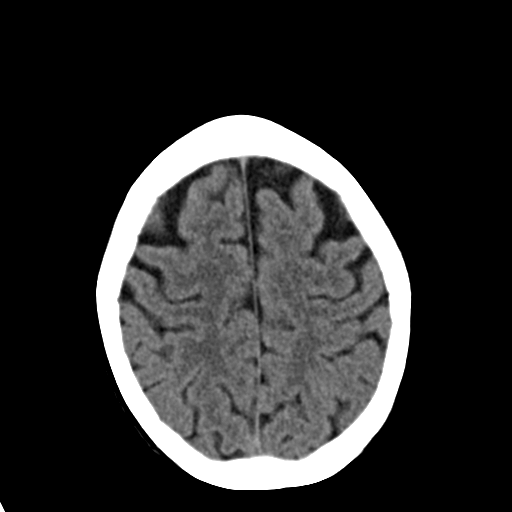
[im 26/32  brain]
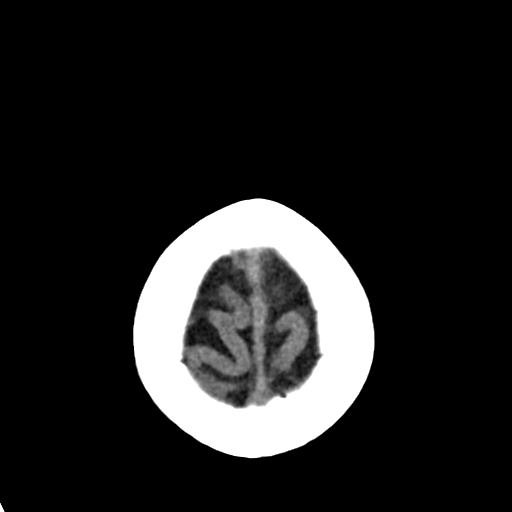
[im 26/32  bone]
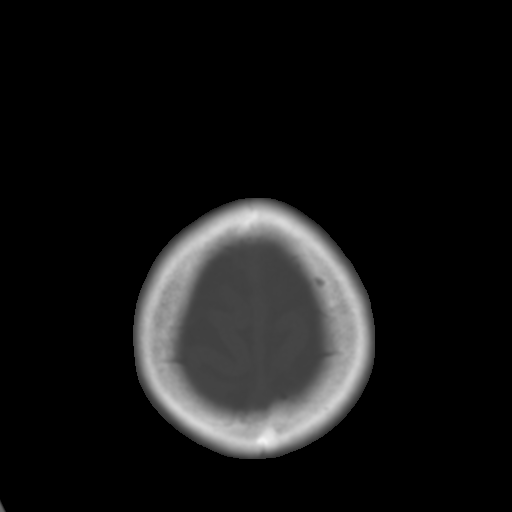

[Series 4: head w · axial · 0.40mm/px · z∈[+1529,+1629]mm · 5 of 32 slices shown]
[im 6/32  brain]
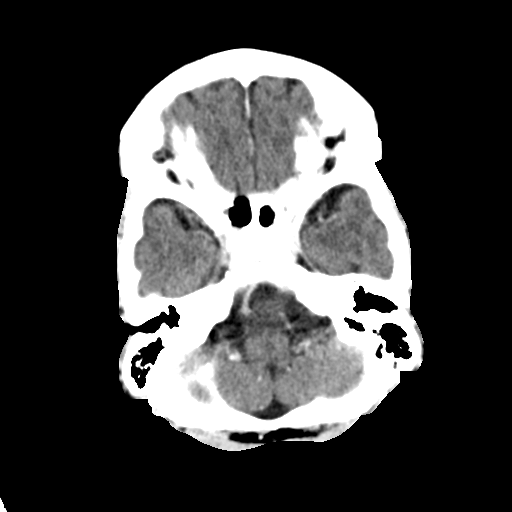
[im 11/32  brain]
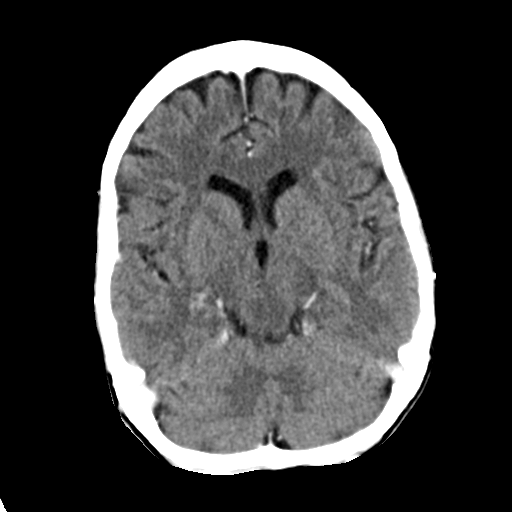
[im 16/32  brain]
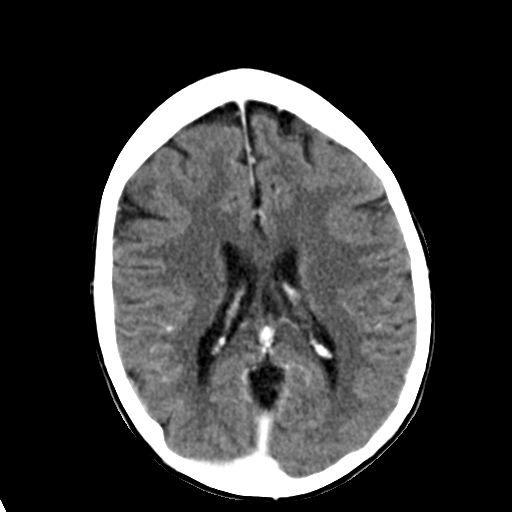
[im 21/32  brain]
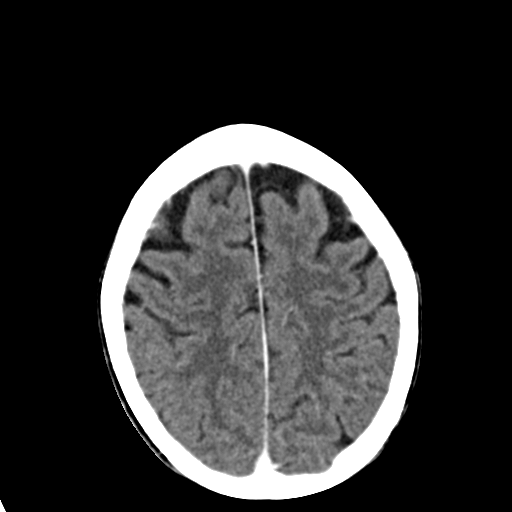
[im 26/32  brain]
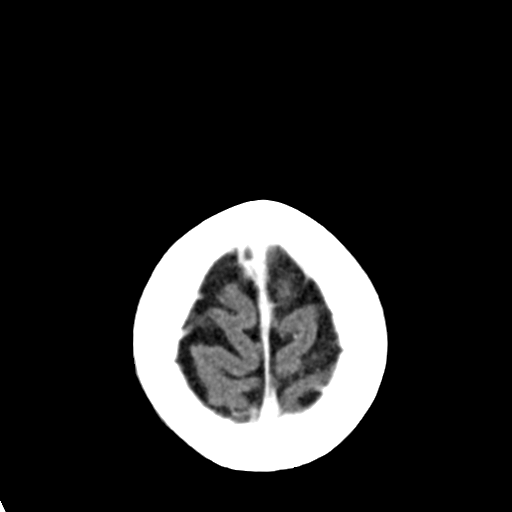

[Series 5: coronal soft tissue · coronal · 0.29mm/px · 3 of 67 slices shown]
[im 23/67  brain]
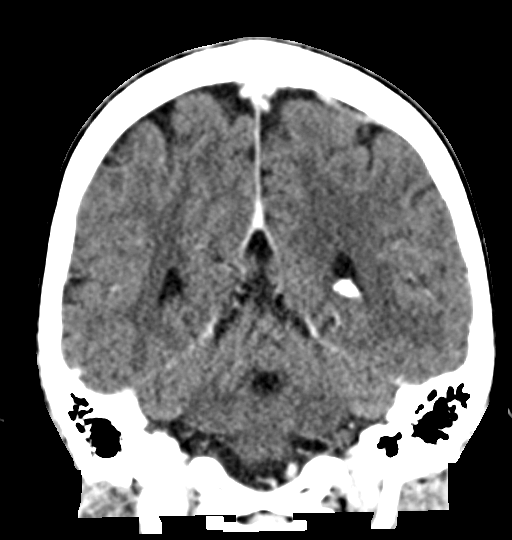
[im 30/67  brain]
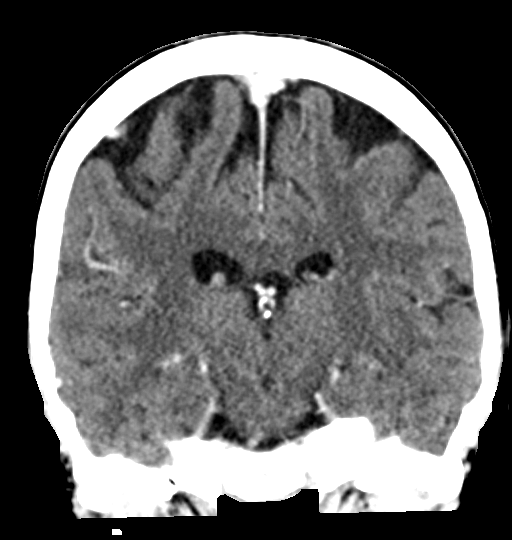
[im 37/67  brain]
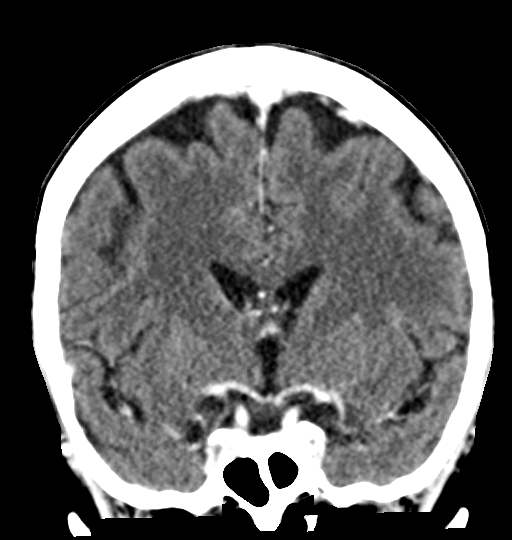

[Series 6: sagittal soft tissue · sagittal · 0.30mm/px · 3 of 49 slices shown]
[im 17/49  brain]
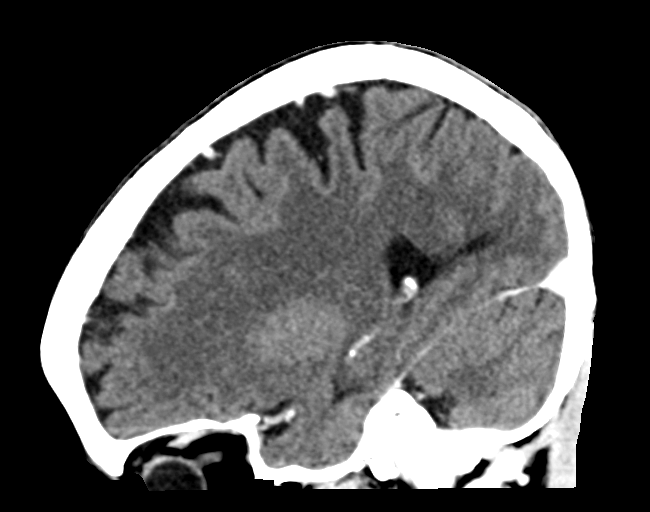
[im 25/49  brain]
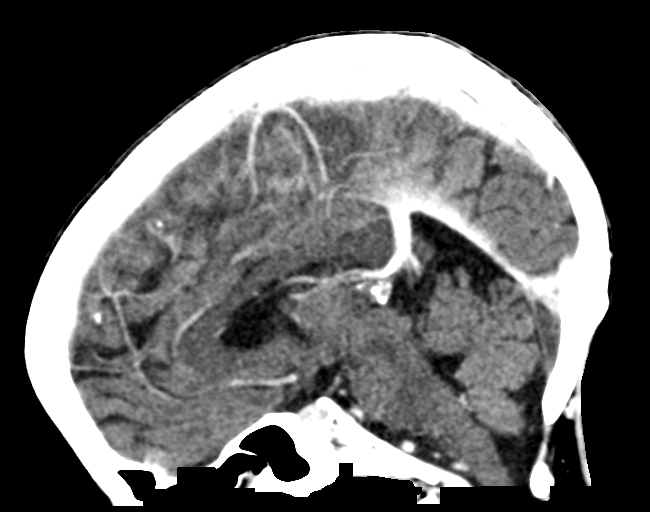
[im 33/49  brain]
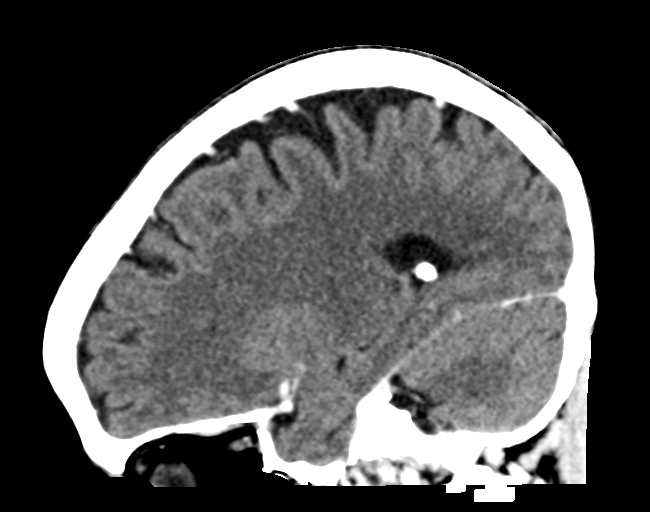

[16 of 47 positions shown; findings below may reference images not displayed]

RADIATION DOSE REDUCTION: This exam was performed according to the
departmental dose-optimization program which includes automated
exposure control, adjustment of the mA and/or kV according to
patient size and/or use of iterative reconstruction technique.

CONTRAST:  75mL OMNIPAQUE IOHEXOL 300 MG/ML  SOLN
FINDINGS: Brain: Some generalized cerebral volume loss since 5028 but cerebral
volume appears to remain within normal limits for age. No midline
shift, ventriculomegaly, mass effect, evidence of mass lesion,
intracranial hemorrhage or evidence of cortically based acute
infarction. Gray-white matter differentiation is within normal
limits throughout the brain. No abnormal enhancement identified.

Vascular: Calcified atherosclerosis at the skull base. The major
intracranial vascular structures are enhancing as expected.

Skull: Stable and negative.

Sinuses/Orbits: Chronic left sphenoid sinusitis has mildly improved
since 5028. Other Visualized paranasal sinuses and mastoids are
clear. Tympanic cavities are clear.

Other: No acute orbit or scalp soft tissue finding.
IMPRESSION: 1. No acute intracranial abnormality. Normal for age CT appearance
of the brain.
2. Chronic left sphenoid sinusitis has mildly improved since [DATE].

## 2022-09-18 IMAGING — MR MR CERVICAL SPINE W/O CM
6 of 7 series · 32 of 48 positions shown · non-contrast
Comparison: None.

CLINICAL DATA: Cervical pain, right shoulder pain

EXAM:
MRI CERVICAL SPINE WITHOUT CONTRAST
TECHNIQUE: Multiplanar, multisequence MR imaging of the cervical spine was
performed. No intravenous contrast was administered.

[Series 5: T1 · sagittal · 3.0mm · 0.69mm/px · 4 of 15 slices shown (1 of 2)]
[im 1/15]
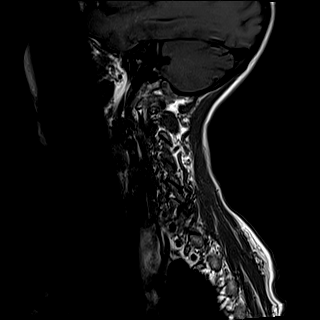
[im 5/15]
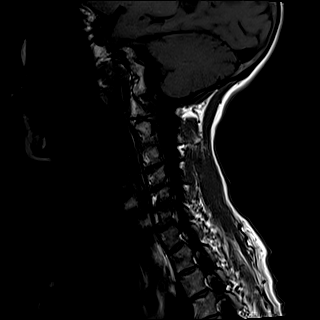
[im 10/15]
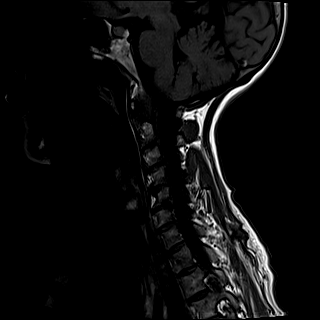
[im 15/15]
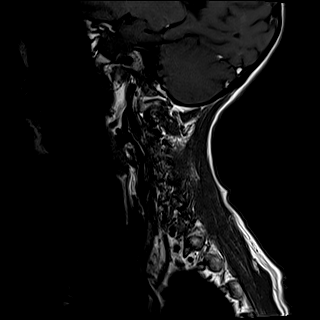

[Series 6: T2 · sagittal · 3.0mm · 0.69mm/px · 4 of 15 slices shown (1 of 2)]
[im 1/15]
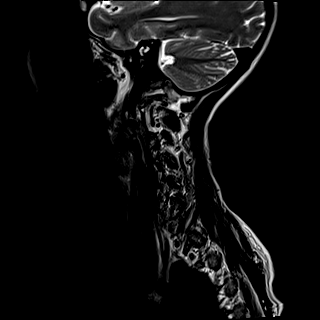
[im 5/15]
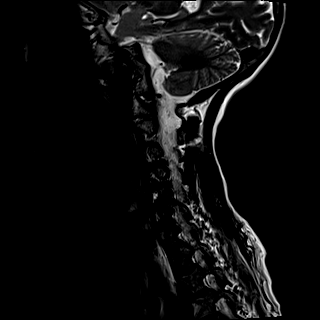
[im 10/15]
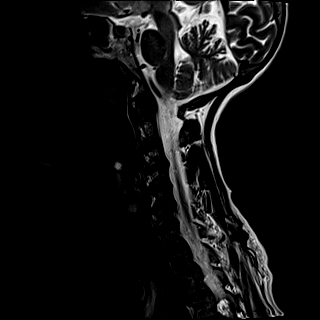
[im 15/15]
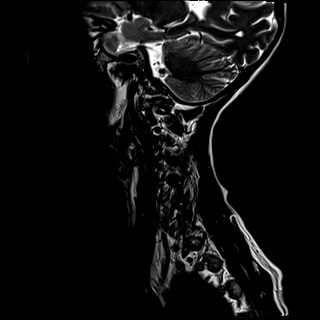

[Series 7: STIR · sagittal · 3.0mm · 0.86mm/px · 4 of 15 slices shown]
[im 1/15]
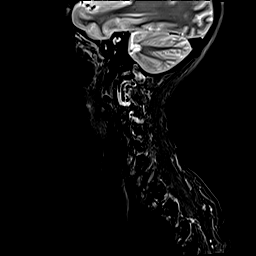
[im 5/15]
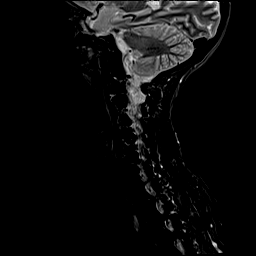
[im 10/15]
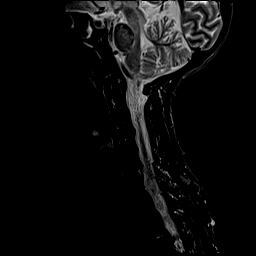
[im 15/15]
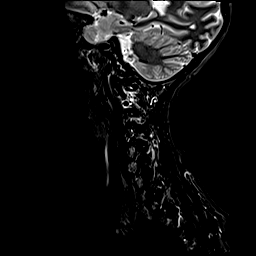

[Series 8: T2 · axial · 3.0mm · 0.70mm/px · z∈[-52,+42]mm · 9 of 30 slices shown (2 of 2)]
[im 1/30]
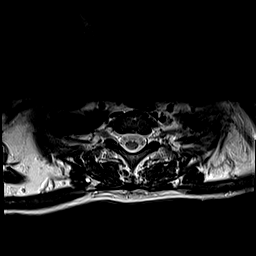
[im 4/30]
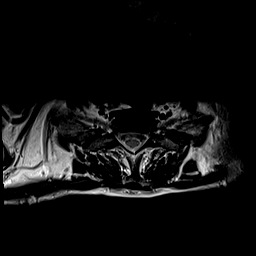
[im 8/30]
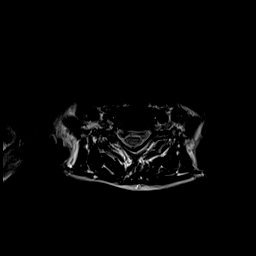
[im 11/30]
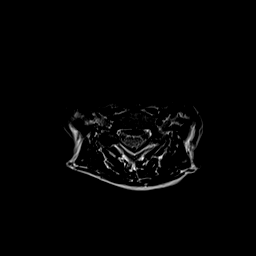
[im 15/30]
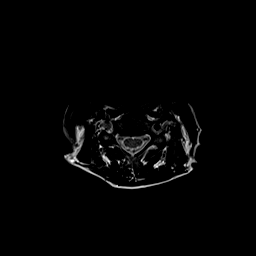
[im 19/30]
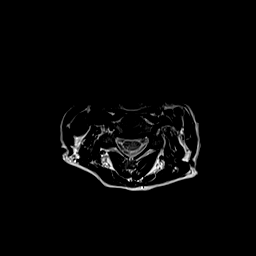
[im 22/30]
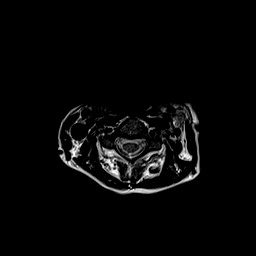
[im 26/30]
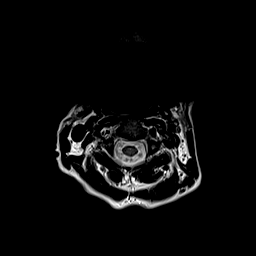
[im 30/30]
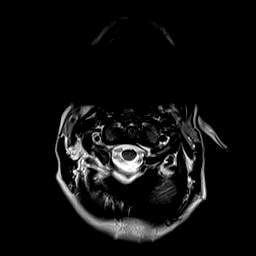

[Series 9: GRE · axial · 3.0mm · 0.35mm/px · z∈[-52,-42]mm · 2 of 30 slices shown]
[im 1/30]
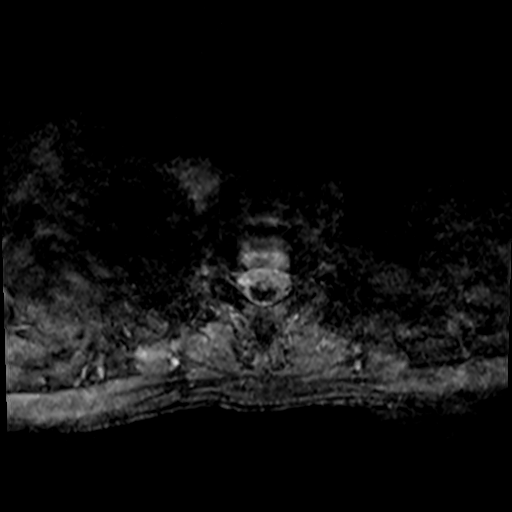
[im 4/30]
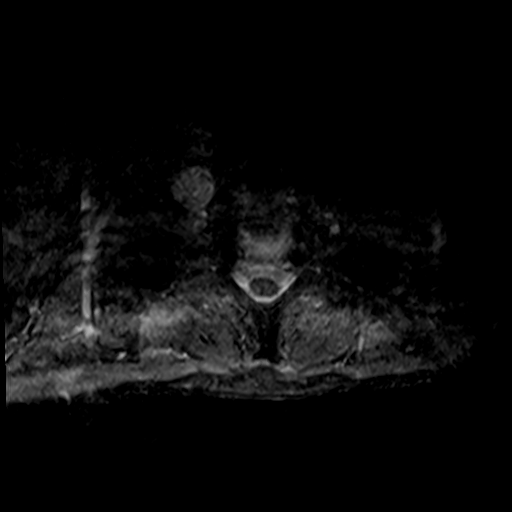

[Series 10: T1 · axial · 3.0mm · 0.35mm/px · z∈[-52,+42]mm · 9 of 30 slices shown (2 of 2)]
[im 1/30]
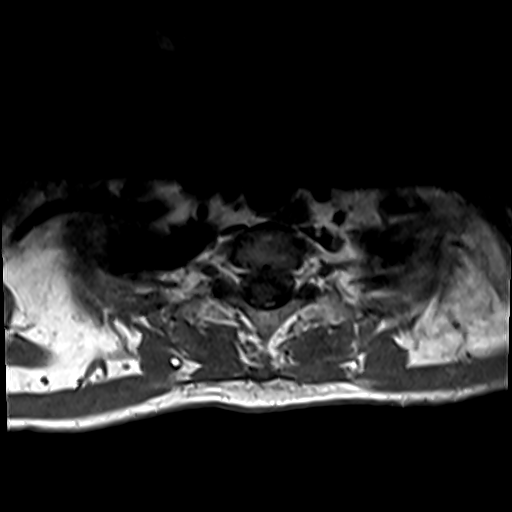
[im 4/30]
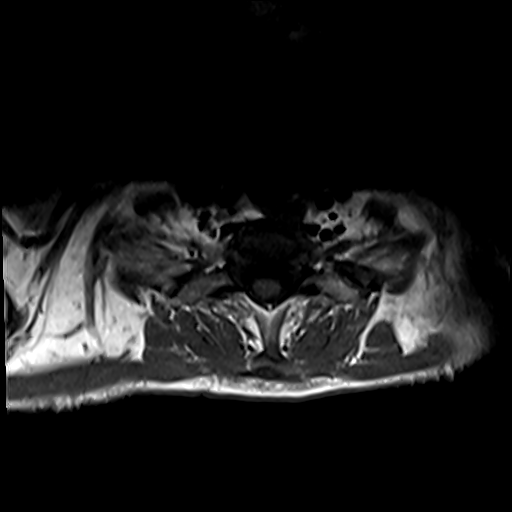
[im 8/30]
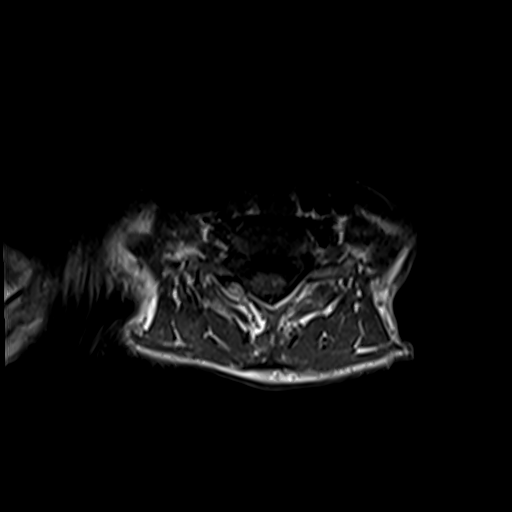
[im 11/30]
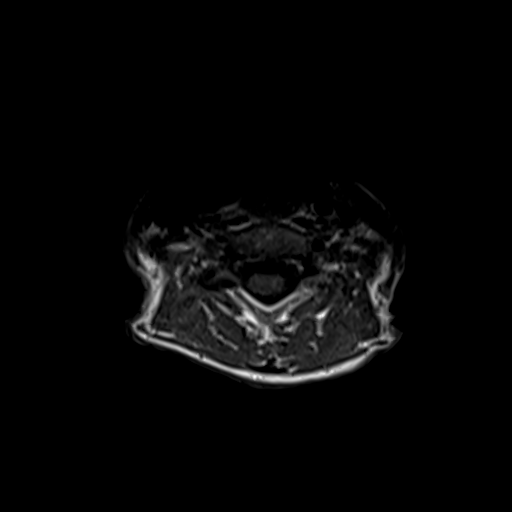
[im 15/30]
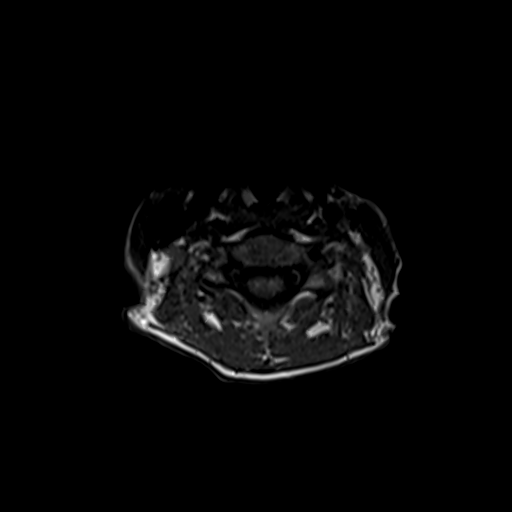
[im 19/30]
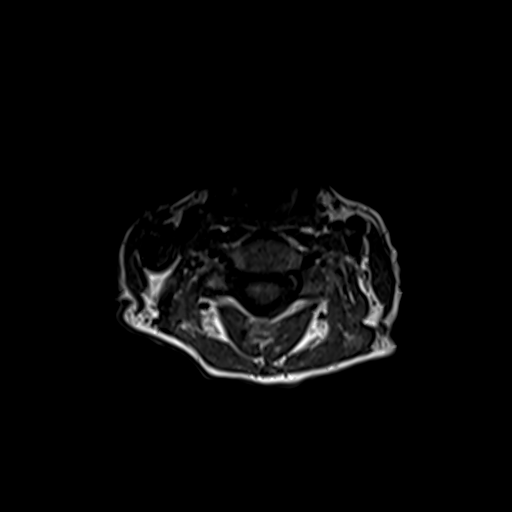
[im 22/30]
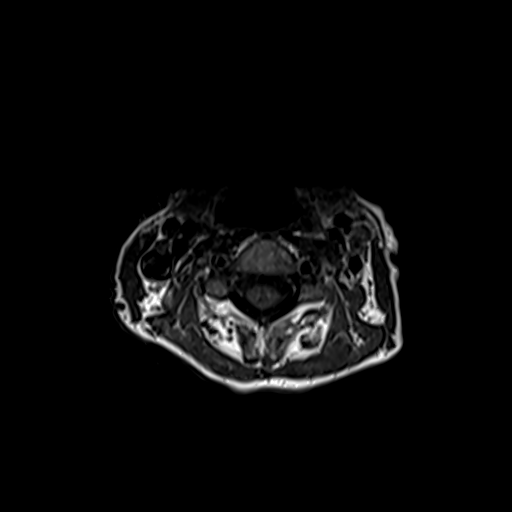
[im 26/30]
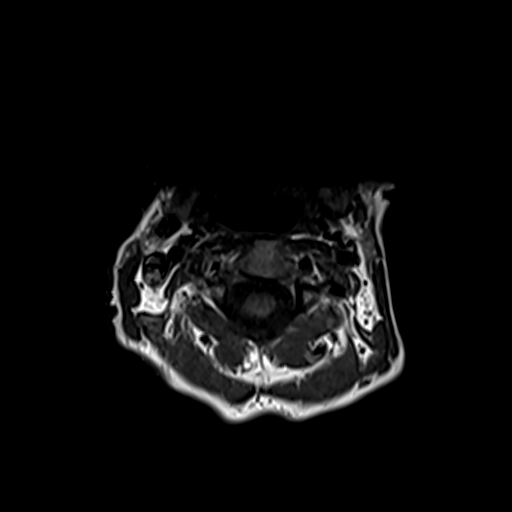
[im 30/30]
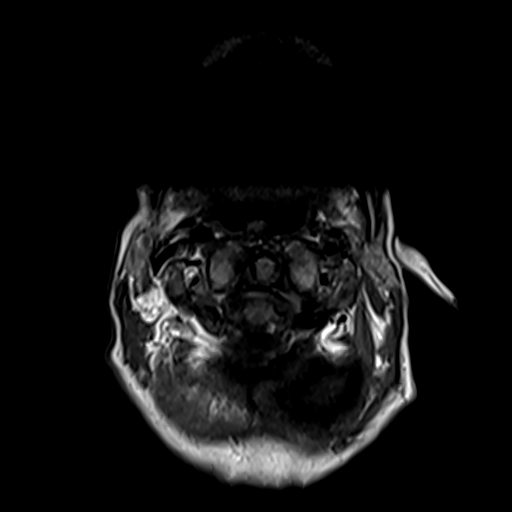

[32 of 48 positions shown; findings below may reference images not displayed]

FINDINGS: Alignment: 2 mm retrolisthesis of C5 on C6.

Vertebrae: No acute fracture, evidence of discitis, or bone lesion.

Cord: Normal signal and morphology.

Posterior Fossa, vertebral arteries, paraspinal tissues: Posterior
fossa demonstrates no focal abnormality. Vertebral artery flow voids
are maintained. Paraspinal soft tissues are unremarkable.

Disc levels:

Discs: Degenerative disease with disc height loss at C4-5 and C5-6.

C2-3: No significant disc bulge. No neural foraminal stenosis. No
central canal stenosis.

C3-4: Mild broad-based disc bulge. No foraminal or central canal
stenosis.

C4-5: Mild broad-based disc bulge. No foraminal or central canal
stenosis.

C5-6: Mild broad-based disc bulge. Bilateral uncovertebral
degenerative changes. Moderate left and severe right foraminal
stenosis. No spinal stenosis.

C6-7: Mild broad-based disc bulge. No foraminal or central canal
stenosis.

C7-T1: No significant disc bulge. No neural foraminal stenosis. No
central canal stenosis.
IMPRESSION: 1. At C5-6 there is a mild broad-based disc bulge. Bilateral
uncovertebral degenerative changes. Moderate left and severe right
foraminal stenosis.
2. Otherwise, mild cervical spine spondylosis as described above.

## 2022-09-20 ENCOUNTER — Ambulatory Visit (HOSPITAL_COMMUNITY): Admission: RE | Admit: 2022-09-20 | Payer: Medicare HMO | Source: Ambulatory Visit

## 2022-09-27 ENCOUNTER — Other Ambulatory Visit (HOSPITAL_COMMUNITY): Payer: Self-pay

## 2022-09-27 ENCOUNTER — Ambulatory Visit: Payer: Medicare HMO | Admitting: Internal Medicine

## 2022-09-27 ENCOUNTER — Other Ambulatory Visit: Payer: Self-pay

## 2022-09-28 ENCOUNTER — Ambulatory Visit (HOSPITAL_COMMUNITY)
Admission: RE | Admit: 2022-09-28 | Discharge: 2022-09-28 | Disposition: A | Discharge status: Home / Self Care | Payer: Medicare HMO | Source: Ambulatory Visit | Attending: Adult Health Nurse Practitioner | Admitting: Adult Health Nurse Practitioner

## 2022-09-28 ENCOUNTER — Ambulatory Visit: Payer: Medicare HMO | Admitting: Internal Medicine

## 2022-09-28 VITALS — BP 94/53 | HR 98 | Temp 97.6°F | Resp 20

## 2022-09-28 DIAGNOSIS — D509 Iron deficiency anemia, unspecified: Secondary | ICD-10-CM | POA: Insufficient documentation

## 2022-09-28 MED ORDER — SODIUM CHLORIDE 0.9 % IV SOLN
200.0000 mg | Freq: Once | INTRAVENOUS | Status: AC
  Administered 2022-09-28: 200 mg via INTRAVENOUS
  Filled 2022-09-28: qty 10

## 2022-09-28 MED ORDER — ACETAMINOPHEN 325 MG PO TABS: 650.0000 mg | ORAL_TABLET | Freq: Once | ORAL | Status: DC

## 2022-09-28 MED ORDER — DIPHENHYDRAMINE HCL 25 MG PO CAPS: 25.0000 mg | ORAL_CAPSULE | Freq: Once | ORAL | Status: DC

## 2022-09-28 NOTE — Progress Notes (Signed)
Diagnosis: Iron Deficiency Anemia  Provider:  Pablo Lawrence NP  Procedure: Infusion  IV Type: Peripheral, IV Location: R Antecubital  Venofer (Iron Sucrose), Dose: 200 mg  Infusion Start Time: 4403  Infusion Stop Time: 4742  Post Infusion IV Care: Patient declined observation and Peripheral IV Discontinued  Discharge: Condition: Good, Destination: Home . AVS provided to patient.   Performed by:  Baxter Hire, RN

## 2022-09-29 ENCOUNTER — Ambulatory Visit (HOSPITAL_COMMUNITY): Payer: Medicare HMO

## 2022-09-29 ENCOUNTER — Ambulatory Visit: Payer: Medicare HMO | Admitting: Internal Medicine

## 2022-09-30 NOTE — Progress Notes (Signed)
Office Visit Note  Patient: Vanessa Lara             Date of Birth: [birth year]           MRN: [id number]             PCP: Pablo Lawrence, NP Referring: Pablo Lawrence, NP Visit Date: 10/03/2022   Subjective:  Follow-up (Not feeling well body getting weaker)   History of Present Illness: ESMIRNA Lara is a 66 y.o. female here for follow up for seropositive RA currently off long term DMARD with prednisone 5 mg daily as needed.  She is overall not doing well feels like pain and stiffness is a big problem but more severe is her fatigue and feels a generalized getting weaker with difficulty moving and with her upper body strength.  She pretty consistently has to take the prednisone for otherwise does not move around much during the day.  Continues to get a benefit with the Flexeril at nighttime with slight decrease in frequency of awakenings.  She is also concerned about getting an update to her bone density testing ongoing oral alendronate treatment.  She is also still having a lot of pain and difficulty with walking from her claw toe deformities on the foot and planning to follow-up with surgery clinic about management of this.  Previous HPI 06/28/22 Vanessa Lara is a 66 y.o. female here for follow up for seropositive RA now off of treatment after stopping the [place].  Since she stopped taking the medication her complaint of night sweats and fatigue have improved. She has seen a worsening of joint pain in several areas particularly around the MCP joints of both hands. For knee pain she saw orthopedic surgery clinic yesterday and had injections on both sides.  She also discussed getting set up to do right SI joint steroid injection.  Biggest complaint today is back pain on the right side around the middle of the back and around the scapula.  This bothers her especially with certain movements and trying to bend side to side.    Previous HPI 05/18/2022  Vanessa Lara is a 66 y.o. female  here for follow up for seropositive rheumatoid arthritis on Kevzara 150 mg [place] q14days. She feels arthritis has been fairly well controlled but has pain worse in her left foot mostly. She had GI issues suspected UTI but with negative culture since last visit. She feels a sensation "like her brain is numb." Also has ongoing upper neck pain without radiating symptoms.    Previous HPI 02/07/2022 Vanessa Lara is a 66 y.o. female here for follow up for rheumatoid arthritis with chronic joint pain in multiple areas and chronic fatigue labs showing elevated rheumatoid factor. Kevzara new start on 12/15/2021. She felt an improvement when taking the injections within first weeks. Accidentally took the shots twice in a week due to forgetfulness or mistake but did not notice any major problems. She called in on account of increased joint pain but felt no improvement with the prescribed medrol dose pack. She did feel more lightheadedness or off balance feeling while taking this so stopped after about 3 days. Currently back pain and her left foot pain are worst issues.   Previous HPI 12/05/2021 Vanessa Lara is a 66 y.o. female here for evaluation of rheumatoid arthritis with chronic joint pain in multiple areas and chronic fatigue labs showing elevated rheumatoid factor. She has history of mitral valve prolapse, severe osteoporosis, osteoarthritis, and severe  weight loss and deconditioning. She was diagnosed with seropositive RA in 2018 with Dr. Kathlene November and most recently saw Dr. Amil Amen in 11/2020. At that time recommended to start hydroxychloroquine for inflammatory arthritis symptoms. She previously tried methotrexate, simponi aria, and orencia with poor tolerance. Imaging consistent with degenerative arthritis in cervical spine and knee but also with some partial tendon tear in shoulder and some synovitis present in knee on MRI imaging. She has taken multiple medications for pain including NSAIDs, prednisone, gabapentin,  tramadol, and hydrocodone or oxycodone. She has a lot of pain in multiple areas but is also severely fatigued. She has significant weight loss down to 70 lbs unintentionally reports chronic IBS issues but no recent changes during this period of weight loss. Joint pain also limits her activity quite a bit with chronic deformities and also ongoing swelling in bilateral hands. She has also developed left sided headache localized above the temporal area. She has a lot of pain at the base of the skull and occiput without clear radiation of symptoms. She is scheduled to see Dr. Billey Gosling for headache evaluation and management 3/28.   DMARD Hx MTX Simponi Orencia Kevzara   05/2017 RF 15 CCP 32 HBV neg HCV neg   Review of Systems  Constitutional:  Positive for fatigue.  HENT:  Negative for mouth sores and mouth dryness.   Eyes:  Positive for dryness.  Respiratory:  Negative for shortness of breath.   Cardiovascular:  Positive for palpitations. Negative for chest pain.  Gastrointestinal:  Positive for constipation and diarrhea. Negative for blood in stool.  Endocrine: Positive for increased urination.  Genitourinary:  Negative for involuntary urination.  Musculoskeletal:  Positive for joint pain, gait problem, joint pain, joint swelling, myalgias, muscle weakness, morning stiffness, muscle tenderness and myalgias.  Skin:  Negative for color change, rash, hair loss and sensitivity to sunlight.  Allergic/Immunologic: Negative for susceptible to infections.  Neurological:  Positive for dizziness and headaches.  Hematological:  Negative for swollen glands.  Psychiatric/Behavioral:  Negative for depressed mood and sleep disturbance. The patient is nervous/anxious.     PMFS History:  Patient Active Problem List   Diagnosis Date Noted   Medication monitoring encounter 12/05/2021   Physical deconditioning 11/11/2021   Elevated serum lactate dehydrogenase (LDH) 08/17/2021   Vitamin D deficiency  08/17/2021   Chronic fatigue 07/25/2021   Vitamin B12 deficiency 07/19/2021   Hardening of the aorta (main artery of the heart) ([place]) 06/21/2021   Solitary lung nodule 06/15/2021   Loss of weight 05/18/2021   Protein-calorie malnutrition, severe ([place]) 05/18/2021   Osteoporosis 06/15/2020   Iron deficiency anemia 04/09/2020   Primary osteoarthritis involving multiple joints 12/31/2018   Rheumatoid arthritis involving multiple sites with positive rheumatoid factor ([place]) 12/31/2018   Irritable bowel syndrome with both constipation and diarrhea 10/28/2018   Constipation 03/14/2016   Mitral valve prolapse 11/23/2015   High cholesterol 11/23/2015    Past Medical History:  Diagnosis Date   Anemia    Anxiety    Arthritis    RA   Cancer (HCC)    Skin   CHF (congestive heart failure) (HCC)    Collagen vascular disease (HCC)    Depression    Endometriosis    High cholesterol    IBS (irritable bowel syndrome)    Lymphadenopathy    Mitral valve prolapse    RA (rheumatoid arthritis) (HCC)    Rotator cuff tear    bilateral   Uterus, adenomyosis     Family History  Problem Relation Age of Onset   Colon cancer Other    Bone cancer Mother    Brain cancer Father    Brain cancer Sister    Breast cancer Sister    Renal Disease Sister    Pancreatic cancer Neg Hx    Esophageal cancer Neg Hx    Stomach cancer Neg Hx    Rectal cancer Neg Hx    Past Surgical History:  Procedure Laterality Date   AXILLARY LYMPH NODE BIOPSY Right 10/12/2020   Procedure: RIGHT AXILLARY LYMPH NODE BIOPSY EXCISION;  Surgeon: Stark Klein, MD;  Location: [facility];  Service: General;  Laterality: Right;   BUNIONECTOMY Right    CARPAL TUNNEL RELEASE Right    COLONOSCOPY     COLONOSCOPY N/A 12/13/2015   Procedure: COLONOSCOPY;  Surgeon: Rogene [place], MD;  Location[facility];  Service: Endoscopy;  Laterality: N/A;  9:55   Fatty tumor     2011 (left arm)   FOOT SURGERY     hammertoe    TONSILLECTOMY     TOTAL ABDOMINAL HYSTERECTOMY     2003   Social History   Social History Narrative   Not on file   Immunization History  Administered Date(s) Administered   Influenza, Quadrivalent, Recombinant, Inj, Pf 07/11/2017     Objective: Vital Signs: BP 113/69 (BP Location: Right Arm, Patient Position: Sitting, Cuff Size: Normal)   Pulse (!) 111   Resp 16   Ht _0  (1.422 m)   Wt 71 lb 3.2 oz (32.3 kg)   BMI 15.96 kg/m    Physical Exam Constitutional:      Comments: Underweight, chronically ill-appearing woman  Eyes:     Conjunctiva/sclera: Conjunctivae normal.  Cardiovascular:     Rate and Rhythm: Normal rate and regular rhythm.  Pulmonary:     Effort: Pulmonary effort is normal.     Breath sounds: Normal breath sounds.  Lymphadenopathy:     Cervical: No cervical adenopathy.  Skin:    General: Skin is warm and dry.  Psychiatric:        Mood and Affect: Mood normal.      Musculoskeletal Exam:  Bilateral shoulder tenderness to pressure, right shoulder decreased abduction range of motion and some pain radiating into the upper arm with pressure around the biceps tendon, left shoulder full range of motion Elbows full ROM no tenderness or swelling Wrists full ROM no tenderness or swelling Fingers with mild MCP joint ulnar deviation and subluxation fully reducible with some tenderness Knees full ROM no tenderness or swelling Ankles full ROM no tenderness or swelling Lateral deviation first MTP joints, cocked up deformities of the second through fourth toes that is nonreducible, tenderness to pressure but without any palpable synovitis   Investigation: No additional findings.  Imaging: XR Cervical Spine 2 or 3 views  Result Date: 10/05/2022 [facility], lateral views of cervical spine reviewed.  Loss of lordosis is present.  Moderate degenerative changes noted at C5-C6 with disc space narrowing and osteophyte formation.  No fracture noted.  No significant  spondylolisthesis noted.   Recent Labs: Lab Results  Component Value Date   WBC 14.2 (H) 10/03/2022   HGB 11.5 (L) 10/03/2022   PLT 362 10/03/2022   NA 140 10/03/2022   K 4.3 10/03/2022   CL 102 10/03/2022   CO2 29 10/03/2022   GLUCOSE 93 10/03/2022   BUN 10 10/03/2022   CREATININE 0.69 10/03/2022   BILITOT 0.2 10/03/2022   ALKPHOS 148 (  H) 08/23/2020   AST 16 10/03/2022   ALT 10 10/03/2022   PROT 7.2 10/03/2022   ALBUMIN 3.6 08/23/2020   CALCIUM 10.2 10/03/2022   GFRAA 92 11/06/2019   QFTBGOLDPLUS NEGATIVE 12/05/2021    Speciality Comments: No specialty comments available.  Procedures:  No procedures performed Allergies: Abatacept, Codeine, Golimumab, Hydrocodone, Methylprednisolone, Methotrexate derivatives, and Prednisone   Assessment / Plan:     Visit Diagnoses: Rheumatoid arthritis involving multiple sites with positive rheumatoid factor (HCC) High risk medication use  High cholesterol - Plan: CBC with Differential/Platelet, COMPLETE METABOLIC PANEL WITH GFR, Lipid panel  Considering alternative treatment options for ongoing RA disease activity including alternative TNF's, Jak inhibitors, rituximab, or ongoing use of prednisone.  Checking sedimentation rate and CRP for disease activity monitoring.  Not on lot of overt peripheral joint synovitis but she is also requiring steroid treatment.  Rechecking a CBC CMP for medication monitoring and a lipid panel for updated baseline.  Unfortunately not a good candidate for Jak inhibitors on close review due to her extensive cardiovascular comorbidities.  Age-related osteoporosis without current pathological fracture  Follow-up order for updated bone density testing to reassess osteoporosis treatment plan with the oral alendronate especially with the possible addition of more prolonged low-dose prednisone treatment.  Primary osteoarthritis involving multiple joints Acute right-sided thoracic back pain - Plan: cyclobenzaprine  (FLEXERIL) 5 MG tablet  Will continue the Flexeril 5 mg at night as needed for lumbar and thoracic back pain.    Orders: Orders Placed This Encounter  Procedures   Sedimentation rate   CBC with Differential/Platelet   COMPLETE METABOLIC PANEL WITH GFR   Lipid panel   C-reactive protein   Meds ordered this encounter  Medications   cyclobenzaprine (FLEXERIL) 5 MG tablet    Sig: Take 1 tablet (5 mg total) by mouth at bedtime as needed for muscle spasms.    Dispense:  30 tablet    Refill:  0     Follow-Up Instructions: Return in about 6 weeks (around 11/14/2022) for RA UPA start f/u 6wks.   Collier Salina, MD  Note - This record has been created using [employer].  Chart creation errors have been sought, but may not always  have been located. Such creation errors do not reflect on  the standard of medical care.

## 2022-10-03 ENCOUNTER — Encounter: Payer: Self-pay | Admitting: Internal Medicine

## 2022-10-03 ENCOUNTER — Ambulatory Visit: Payer: Medicare HMO | Attending: Internal Medicine | Admitting: Internal Medicine

## 2022-10-03 ENCOUNTER — Telehealth: Payer: Self-pay | Admitting: *Deleted

## 2022-10-03 VITALS — BP 113/69 | HR 111 | Resp 16 | Ht <= 58 in | Wt 71.2 lb

## 2022-10-03 DIAGNOSIS — Z79899 Other long term (current) drug therapy: Secondary | ICD-10-CM

## 2022-10-03 DIAGNOSIS — M150 Primary generalized (osteo)arthritis: Secondary | ICD-10-CM

## 2022-10-03 DIAGNOSIS — M0579 Rheumatoid arthritis with rheumatoid factor of multiple sites without organ or systems involvement: Secondary | ICD-10-CM | POA: Diagnosis not present

## 2022-10-03 DIAGNOSIS — M546 Pain in thoracic spine: Secondary | ICD-10-CM

## 2022-10-03 DIAGNOSIS — M810 Age-related osteoporosis without current pathological fracture: Secondary | ICD-10-CM

## 2022-10-03 DIAGNOSIS — M159 Polyosteoarthritis, unspecified: Secondary | ICD-10-CM | POA: Diagnosis not present

## 2022-10-03 DIAGNOSIS — E7800 Pure hypercholesterolemia, unspecified: Secondary | ICD-10-CM

## 2022-10-03 MED ORDER — CYCLOBENZAPRINE HCL 5 MG PO TABS: 5.0000 mg | ORAL_TABLET | Freq: Every evening | ORAL | 0 refills | Status: DC | PRN

## 2022-10-03 NOTE — Telephone Encounter (Signed)
Patient called asking for an order for a bone density scan. Patient's last scan was at [facility] in July 2022. It was order by Dr. Marlou Sa. Can we place order for bone density scan?

## 2022-10-03 NOTE — Patient Instructions (Signed)
Upadacitinib Extended-Release Tablets What is this medication? UPADACITINIB (ue PAD a SYE ti nib) treats autoimmune conditions, such as arthritis, eczema, and ulcerative colitis. It is prescribed when other medications have not worked or cannot be tolerated. It works by slowing down an overactive immune system. This decreases inflammation. This medicine may be used for other purposes; ask your health care provider or pharmacist if you have questions. COMMON BRAND NAME(S): RINVOQ What should I tell my care team before I take this medication? They need to know if you have any of these conditions: Blood clots Cancer Diabetes (high blood sugar) Heart disease High blood pressure High cholesterol Immune system problems Infection, especially a viral infection, such as chickenpox, cold sores, or herpes Infection, such as tuberculosis (TB), or other bacterial, fungal or viral infection Kidney disease Liver disease Low blood cell levels (white cells, red cells, and platelets) Lung or breathing disease, such as asthma or COPD Organ transplant Recent or upcoming vaccine Skin cancer/melanoma Stomach or intestine problems Stroke Tobacco use An unusual or allergic reaction to upadacitinib, other medications, foods, dyes or preservatives Pregnant or trying to get pregnant Breast-feeding How should I use this medication? Take this medication by mouth with water. Take it as directed on the prescription label at the same time every day. Do not cut, crush, or chew this medication. Swallow the tablets whole. You can take it with or without food. If it upsets your stomach, take it with food. Do not take this medication with grapefruit juice. Keep taking it unless your care team tells you to stop. A special MedGuide will be given to you by the pharmacist with each prescription and refill. Be sure to read this information carefully each time. Talk to your care team about the use of this medication in  children. While this medication may be prescribed for children as young as 12 years for selected conditions, precautions do apply. Overdosage: If you think you have taken too much of this medicine contact a poison control center or emergency room at once. NOTE: This medicine is only for you. Do not share this medicine with others. What if I miss a dose? If you miss a dose, take it as soon as you can. If it is almost time for your next dose, take only that dose. Do not take double or extra doses. What may interact with this medication? Do not take this medication with any of the following: Baricitinib Tofacitinib This medication may also interact with the following: Azathioprine Certain antivirals for hepatitis or HIV Biologic medications, such as abatacept, adalimumab, anakinra, certolizumab, etanercept, golimumab, infliximab, rituximab, secukinumab, tocilizumab, ustekinumab Certain medications for fungal infections, such as ketoconazole, itraconazole, posaconazole, or voriconazole Certain medications for seizures, such as carbamazepine, phenobarbital, phenytoin Clarithromycin Cyclosporine Grapefruit and grapefruit juice Live virus vaccines Medications that lower your chance of fighting infection Mifepristone Nefazodone Rifampin Supplements, such as St. John's wort This list may not describe all possible interactions. Give your health care provider a list of all the medicines, herbs, non-prescription drugs, or dietary supplements you use. Also tell them if you smoke, drink alcohol, or use illegal drugs. Some items may interact with your medicine. What should I watch for while using this medication? Visit your care team for regular checks on your progress. Tell your care team if your symptoms do not start to get better or if they get worse. You may need blood work while taking this medication. Avoid taking medications that contain aspirin, acetaminophen, ibuprofen, naproxen, or ketoprofen    unless instructed by your care team. These medications may hide a fever. This medication may increase your risk of getting an infection. Call your care team for advice if you get a fever, chills, sore throat, or other symptoms of a cold or flu. Do not treat yourself. Try to avoid being around people who are sick. Talk to your care team if you wish to become pregnant or think you might be pregnant. This medication can cause serious birth defects if taken during pregnancy and for 4 weeks after stopping it. A barrier contraceptive, such as a condom or diaphragm, is recommended while taking this medication and for 4 weeks after stopping it. Talk to your care team about other forms of contraception. Do not breastfeed while taking this medication and for 6 days after the last dose. Talk to your care team about your risk of cancer. You may be more at risk for certain types of cancer if you take this medication. Talk to your care team about your risk of skin cancer. You may be more at risk for skin cancer if you take this medication. This medication can make you more sensitive to the sun. Keep out of the sun. If you cannot avoid being in the sun, wear protective clothing and sunscreen. Do not use sun lamps or tanning beds/booths. Tell your care team right away if you have any change in your eyesight. Talk to your care team if you often see part of the tablet in your stool. What side effects may I notice from receiving this medication? Side effects that you should report to your care team as soon as possible: Allergic reactions--skin rash, itching, hives, swelling of the face, lips, tongue, or throat Blood clot--pain, swelling, or warmth in the leg, shortness of breath, chest pain Change in vision Heart attack--pain or tightness in the chest, shoulders, arms, or jaw, nausea, shortness of breath, cold or clammy skin, feeling faint or lightheaded Infection--fever, chills, cough, sore throat, wounds that don't  heal, pain or trouble when passing urine, general feeling of discomfort or being unwell Liver injury--right upper belly pain, loss of appetite, nausea, light-colored stool, dark yellow or brown urine, yellowing skin or eyes, unusual weakness or fatigue Low red blood cell level--unusual weakness or fatigue, dizziness, headache, trouble breathing Sudden or severe stomach pain, bloody diarrhea, fever, nausea, vomiting Stroke--sudden numbness or weakness of the face, arm, or leg, trouble speaking, confusion, trouble walking, loss of balance or coordination, dizziness, severe headache, change in vision Side effects that usually do not require medical attention (report to your care team if they continue or are bothersome): Acne Cough Headache Nausea Runny or stuffy nose This list may not describe all possible side effects. Call your doctor for medical advice about side effects. You may report side effects to FDA at 1-800-FDA-1088. Where should I keep my medication? Keep out of the reach of children and pets. Store at room temperature between 20 and 25 degrees C (68 and 77 degrees F). Protect from moisture. Keep the container tightly closed. Keep this medication in the original container until you are ready to take it. Get rid of any unused medication after the expiration date. To get rid of medications that are no longer needed or have expired: Take the medication to a medication take-back program. Check with your pharmacy or law enforcement to find a location. If you cannot return the medication, check the label or package insert to see if the medication should be thrown out in the garbage   or flushed down the toilet. If you are not sure, ask your care team. If it is safe to put it in the trash, empty the medication out of the container. Mix the medication with cat litter, dirt, coffee grounds, or other unwanted substance. Seal the mixture in a bag or container. Put it in the trash. NOTE: This sheet is a  summary. It may not cover all possible information. If you have questions about this medicine, talk to your doctor, pharmacist, or health care provider.  2023 Elsevier/Gold Standard (2021-07-27 00:00:00)  

## 2022-10-04 ENCOUNTER — Telehealth: Payer: Self-pay | Admitting: *Deleted

## 2022-10-04 ENCOUNTER — Ambulatory Visit (INDEPENDENT_AMBULATORY_CARE_PROVIDER_SITE_OTHER): Payer: Medicare HMO

## 2022-10-04 ENCOUNTER — Telehealth: Payer: Medicare HMO

## 2022-10-04 ENCOUNTER — Ambulatory Visit (INDEPENDENT_AMBULATORY_CARE_PROVIDER_SITE_OTHER): Payer: Medicare HMO | Admitting: Surgical

## 2022-10-04 DIAGNOSIS — M542 Cervicalgia: Secondary | ICD-10-CM | POA: Diagnosis not present

## 2022-10-04 DIAGNOSIS — M1711 Unilateral primary osteoarthritis, right knee: Secondary | ICD-10-CM

## 2022-10-04 DIAGNOSIS — M170 Bilateral primary osteoarthritis of knee: Secondary | ICD-10-CM

## 2022-10-04 DIAGNOSIS — M1712 Unilateral primary osteoarthritis, left knee: Secondary | ICD-10-CM

## 2022-10-04 LAB — LIPID PANEL
Cholesterol: 188 mg/dL (ref <200)
HDL: 58 mg/dL (ref >=50)
LDL Cholesterol (Calc): 105 mg/dL (calc) — ABNORMAL HIGH
Non-HDL Cholesterol (Calc): 130 mg/dL (calc) — ABNORMAL HIGH (ref <130)
Total CHOL/HDL Ratio: 3.2 (calc) (ref <5.0)
Triglycerides: 137 mg/dL (ref <150)

## 2022-10-04 LAB — CBC WITH DIFFERENTIAL/PLATELET
Absolute Monocytes: 554 cells/uL (ref 200–950)
Basophils Absolute: 85 cells/uL (ref 0–200)
Basophils Relative: 0.6 %
Eosinophils Absolute: 156 cells/uL (ref 15–500)
Eosinophils Relative: 1.1 %
HCT: 34.3 % — ABNORMAL LOW (ref 35.0–45.0)
Hemoglobin: 11.5 g/dL — ABNORMAL LOW (ref 11.7–15.5)
Lymphs Abs: 1633 cells/uL (ref 850–3900)
MCH: 29.2 pg (ref 27.0–33.0)
MCHC: 33.5 g/dL (ref 32.0–36.0)
MCV: 87.1 fL (ref 80.0–100.0)
MPV: 10.4 fL (ref 7.5–12.5)
Monocytes Relative: 3.9 %
Neutro Abs: 11772 cells/uL — ABNORMAL HIGH (ref 1500–7800)
Neutrophils Relative %: 82.9 %
Platelets: 362 10*3/uL (ref 140–400)
RBC: 3.94 10*6/uL (ref 3.80–5.10)
RDW: 13.1 % (ref 11.0–15.0)
Total Lymphocyte: 11.5 %
WBC: 14.2 10*3/uL — ABNORMAL HIGH (ref 3.8–10.8)

## 2022-10-04 LAB — COMPLETE METABOLIC PANEL WITH GFR
AG Ratio: 1.5 (calc) (ref 1.0–2.5)
ALT: 10 U/L (ref 6–29)
AST: 16 U/L (ref 10–35)
Albumin: 4.3 g/dL (ref 3.6–5.1)
Alkaline phosphatase (APISO): 96 U/L (ref 37–153)
BUN: 10 mg/dL (ref 7–25)
CO2: 29 mmol/L (ref 20–32)
Calcium: 10.2 mg/dL (ref 8.6–10.4)
Chloride: 102 mmol/L (ref 98–110)
Creat: 0.69 mg/dL (ref 0.50–1.05)
Globulin: 2.9 g/dL (calc) (ref 1.9–3.7)
Glucose, Bld: 93 mg/dL (ref 65–99)
Potassium: 4.3 mmol/L (ref 3.5–5.3)
Sodium: 140 mmol/L (ref 135–146)
Total Bilirubin: 0.2 mg/dL (ref 0.2–1.2)
Total Protein: 7.2 g/dL (ref 6.1–8.1)
eGFR: 96 mL/min/{1.73_m2} (ref >=60)

## 2022-10-04 LAB — C-REACTIVE PROTEIN: CRP: 4.4 mg/L (ref <8.0)

## 2022-10-04 LAB — SEDIMENTATION RATE: Sed Rate: 46 mm/h — ABNORMAL HIGH (ref 0–30)

## 2022-10-04 NOTE — Telephone Encounter (Signed)
Can we get patient approved for Bil knee gel injections

## 2022-10-04 NOTE — Telephone Encounter (Signed)
Patient asked prior to leaving the office after her appointment on 10/03/2022 that she is supposed to be getting a surgery on her toes. Patient wanted to know if Dr. Benjamine Mola would advise she go ahead with surgery or wait and see if the Rinvoq improves her issue with her toes. Reviewed with Dr. Benjamine Mola and he advised the Rinvoq will not help with the toe issues. He advised patient should go ahead with the surgery and we can delay starting Rinvoq until after surgery has been completed and she is clear to start. Patient expressed understanding.

## 2022-10-05 ENCOUNTER — Encounter: Payer: Self-pay | Admitting: Surgical

## 2022-10-05 MED ORDER — METHYLPREDNISOLONE ACETATE 40 MG/ML IJ SUSP
40.0000 mg | INTRAMUSCULAR | Status: AC | PRN
  Administered 2022-10-04: 40 mg via INTRA_ARTICULAR

## 2022-10-05 MED ORDER — BUPIVACAINE HCL 0.25 % IJ SOLN
4.0000 mL | INTRAMUSCULAR | Status: AC | PRN
  Administered 2022-10-04: 4 mL via INTRA_ARTICULAR

## 2022-10-05 MED ORDER — LIDOCAINE HCL 1 % IJ SOLN
5.0000 mL | INTRAMUSCULAR | Status: AC | PRN
  Administered 2022-10-04: 5 mL

## 2022-10-05 NOTE — Telephone Encounter (Signed)
Mcarthur Rossetti Josem Kaufmann: [id number] exp. 10/05/22-11/04/22 for [employer]

## 2022-10-05 NOTE — Progress Notes (Signed)
Office Visit Note   Patient: Vanessa Lara           Date of Birth: 1956/06/03           MRN: [id number] Visit Date: 10/04/2022 Requested by: Pablo Lawrence, NP [place],  [place] [id number] PCP: Pablo Lawrence, NP  Subjective: Chief Complaint  Patient presents with   Right Knee - Pain   Left Knee - Pain    HPI: Vanessa Lara is a 67 y.o. female who presents to the office reporting bilateral knee pain and neck pain.  Patient states she has had return of her bilateral knee pain.  No new injuries.  She has history of rheumatoid arthritis with good relief from cortisone injections in the past into the knees.  No fevers or chills.  No significant increase in swelling in her knees.  She also complains of neck pain throughout the axial cervical spine with radiation of pain into both shoulders and to the medial border of the scapula.  She has burning pain in this distribution and achy pain as well.  She has had pain in this distribution that keeps her up at night at times.  She feels subjectively weak in both arms..    Patient also complains of foot pain.  She is recently saw Dr. Sharol Given but has not heard from his surgery scheduler for scheduling surgery.  As an aside, she does report that she had great relief from right-sided SI joint injection she had from Dr. Rolena Infante several months ago.              ROS: All systems reviewed are negative as they relate to the chief complaint within the history of present illness.  Patient denies fevers or chills.  Assessment & Plan: Visit Diagnoses:  1. Neck pain     Plan: Patient is a 67 year old female who presents for evaluation of bilateral knee pain and neck pain.  She has neck pain that radiates into both shoulder regions and down to the elbows as well as into the medial border of both scapular regions.  She has had this pain ongoing for several months.  Does not really have the ability to do physical therapy due to her  chronic fatigue.  Discussed the options available to patient.  She has moderate degenerative changes noted at C5-C6 which may be concerning for nerve impingement at this level responsible for her radicular symptoms.  Plan to proceed with MRI cervical spine for further evaluation.  Also complains of bilateral knee pain.  Has had previous bilateral knee injections with good relief.  Course injections administered today and patient tolerated procedure without difficulty.  Plan to preapproved for bilateral knee gel injections.  Follow-up after gel injection approval.  This patient is diagnosed with osteoarthritis of the knee(s).    Radiographs show evidence of joint space narrowing, osteophytes, subchondral sclerosis and/or subchondral cysts.  This patient has knee pain which interferes with functional and activities of daily living.    This patient has experienced inadequate response, adverse effects and/or intolerance with conservative treatments such as acetaminophen, NSAIDS, topical creams, physical therapy or regular exercise, knee bracing and/or weight loss.   This patient has experienced inadequate response or has a contraindication to intra articular steroid injections for at least 3 months.   This patient is not scheduled to have a total knee replacement within 6 months of starting treatment with viscosupplementation.   Follow-Up Instructions: No follow-ups on  file.   Orders:  Orders Placed This Encounter  Procedures   XR Cervical Spine 2 or 3 views   MR Cervical Spine w/o contrast   No orders of the defined types were placed in this encounter.     Procedures: Large Joint Inj: bilateral knee on 10/04/2022 8:42 PM Indications: diagnostic evaluation, joint swelling and pain Details: 18 G 1.5 in needle, superolateral approach  Arthrogram: No  Medications (Right): 5 mL lidocaine 1 %; 4 mL bupivacaine 0.25 %; 40 mg methylPREDNISolone acetate 40 MG/ML Medications (Left): 5 mL  lidocaine 1 %; 4 mL bupivacaine 0.25 %; 40 mg methylPREDNISolone acetate 40 MG/ML Outcome: tolerated well, no immediate complications Procedure, treatment alternatives, risks and benefits explained, specific risks discussed. Consent was given by the patient. Immediately prior to procedure a time out was called to verify the correct patient, procedure, equipment, support staff and site/side marked as required. Patient was prepped and draped in the usual sterile fashion.       Clinical Data: No additional findings.  Objective: Vital Signs: There were no vitals taken for this visit.  Physical Exam:  Constitutional: Patient appears well-developed HEENT:  Head: Normocephalic Eyes:EOM are normal Neck: Normal range of motion Cardiovascular: Normal rate Pulmonary/chest: Effort normal Neurologic: Patient is alert Skin: Skin is warm Psychiatric: Patient has normal mood and affect  Ortho Exam: Ortho exam demonstrates bilateral knees without effusion.  Tenderness over the medial or lateral joint lines of both knees.  Able to perform straight leg raise without extensor lag.  No cellulitis or skin changes noted.  No calf tenderness.  Negative Homans' sign.  No pain with hip range of motion.  Range of motion from 0 degrees extension and 125 degrees knee flexion.  She has intact EPL, FPL, finger abduction, pronation/supination, bicep, tricep, deltoid bilaterally.  Tenderness throughout the axial cervical spine.  She has decreased cervical spine range of motion particularly when she tilts her head to her left.  Negative Spurling sign.  Negative Lhermitte sign.  She has negative Hoffmann sign bilaterally.  She is able to maintain upright posture with her eyes closed for about 20 to 30 seconds.  There is no swaying during this.  Specialty Comments:  MRI CERVICAL SPINE WITHOUT CONTRAST   TECHNIQUE: Multiplanar, multisequence MR imaging of the cervical spine was performed. No intravenous contrast was  administered.   COMPARISON:  None.   FINDINGS: Alignment: 2 mm retrolisthesis of C5 on C6.   Vertebrae: No acute fracture, evidence of discitis, or bone lesion.   Cord: Normal signal and morphology.   Posterior Fossa, vertebral arteries, paraspinal tissues: Posterior fossa demonstrates no focal abnormality. Vertebral artery flow voids are maintained. Paraspinal soft tissues are unremarkable.   Disc levels:   Discs: Degenerative disease with disc height loss at C4-5 and C5-6.   C2-3: No significant disc bulge. No neural foraminal stenosis. No central canal stenosis.   C3-4: Mild broad-based disc bulge. No foraminal or central canal stenosis.   C4-5: Mild broad-based disc bulge. No foraminal or central canal stenosis.   C5-6: Mild broad-based disc bulge. Bilateral uncovertebral degenerative changes. Moderate left and severe right foraminal stenosis. No spinal stenosis.   C6-7: Mild broad-based disc bulge. No foraminal or central canal stenosis.   C7-T1: No significant disc bulge. No neural foraminal stenosis. No central canal stenosis.   IMPRESSION: 1. At C5-6 there is a mild broad-based disc bulge. Bilateral uncovertebral degenerative changes. Moderate left and severe right foraminal stenosis. 2. Otherwise, mild cervical spine spondylosis  as described above.     Electronically Signed   By: Kathreen Devoid M.D.   On: 12/16/2021 10:51  Imaging: No results found.   PMFS History: Patient Active Problem List   Diagnosis Date Noted   Medication monitoring encounter 12/05/2021   Physical deconditioning 11/11/2021   Elevated serum lactate dehydrogenase (LDH) 08/17/2021   Vitamin D deficiency 08/17/2021   Chronic fatigue 07/25/2021   Vitamin B12 deficiency 07/19/2021   Hardening of the aorta (main artery of the heart) ([place]) 06/21/2021   Solitary lung nodule 06/15/2021   Loss of weight 05/18/2021   Protein-calorie malnutrition, severe ([place]) 05/18/2021    Osteoporosis 06/15/2020   Iron deficiency anemia 04/09/2020   Primary osteoarthritis involving multiple joints 12/31/2018   Rheumatoid arthritis involving multiple sites with positive rheumatoid factor ([place]) 12/31/2018   Irritable bowel syndrome with both constipation and diarrhea 10/28/2018   Constipation 03/14/2016   Mitral valve prolapse 11/23/2015   High cholesterol 11/23/2015   Past Medical History:  Diagnosis Date   Anemia    Anxiety    Arthritis    RA   Cancer (HCC)    Skin   CHF (congestive heart failure) (HCC)    Collagen vascular disease (HCC)    Depression    Endometriosis    High cholesterol    IBS (irritable bowel syndrome)    Lymphadenopathy    Mitral valve prolapse    RA (rheumatoid arthritis) (HCC)    Rotator cuff tear    bilateral   Uterus, adenomyosis     Family History  Problem Relation Age of Onset   Colon cancer Other    Bone cancer Mother    Brain cancer Father    Brain cancer Sister    Breast cancer Sister    Renal Disease Sister    Pancreatic cancer Neg Hx    Esophageal cancer Neg Hx    Stomach cancer Neg Hx    Rectal cancer Neg Hx     Past Surgical History:  Procedure Laterality Date   AXILLARY LYMPH NODE BIOPSY Right 10/12/2020   Procedure: RIGHT AXILLARY LYMPH NODE BIOPSY EXCISION;  Surgeon: Stark Klein, MD;  Location: [facility];  Service: General;  Laterality: Right;   BUNIONECTOMY Right    CARPAL TUNNEL RELEASE Right    COLONOSCOPY     COLONOSCOPY N/A 12/13/2015   Procedure: COLONOSCOPY;  Surgeon: Rogene Houston, MD;  Location: [facility];  Service: Endoscopy;  Laterality: N/A;  9:55   Fatty tumor     2011 (left arm)   FOOT SURGERY     hammertoe   TONSILLECTOMY     TOTAL ABDOMINAL HYSTERECTOMY     2003   Social History   Occupational History   Occupation: retired  Tobacco Use   Smoking status: Some Days    Packs/day: 0.50    Years: 33.00    Total pack years: 16.50    Types: Cigarettes    Last  attempt to quit: 2013    Years since quitting: 11.0   Smokeless tobacco: Never  Vaping Use   Vaping Use: Never used  Substance and Sexual Activity   Alcohol use: No    Alcohol/week: 0.0 standard drinks of alcohol   Drug use: No   Sexual activity: Not Currently

## 2022-10-06 NOTE — Telephone Encounter (Signed)
VOB submitted for Monovisc, bilateral knee  

## 2022-10-08 ENCOUNTER — Ambulatory Visit (HOSPITAL_COMMUNITY): Admission: RE | Admit: 2022-10-08 | Payer: Medicare HMO | Source: Ambulatory Visit

## 2022-10-09 ENCOUNTER — Ambulatory Visit: Payer: Medicare HMO | Admitting: Orthopedic Surgery

## 2022-10-10 ENCOUNTER — Other Ambulatory Visit (HOSPITAL_COMMUNITY): Payer: Self-pay

## 2022-10-10 ENCOUNTER — Ambulatory Visit (HOSPITAL_COMMUNITY): Admission: RE | Admit: 2022-10-10 | Payer: Medicare HMO | Source: Ambulatory Visit

## 2022-10-12 ENCOUNTER — Encounter (HOSPITAL_COMMUNITY)
Admission: RE | Admit: 2022-10-12 | Discharge: 2022-10-12 | Disposition: A | Discharge status: Home / Self Care | Payer: Medicare HMO | Source: Ambulatory Visit | Attending: Adult Health Nurse Practitioner | Admitting: Adult Health Nurse Practitioner

## 2022-10-12 VITALS — BP 114/61 | HR 89 | Temp 97.8°F | Resp 18

## 2022-10-12 DIAGNOSIS — D509 Iron deficiency anemia, unspecified: Secondary | ICD-10-CM | POA: Diagnosis present

## 2022-10-12 MED ORDER — ACETAMINOPHEN 325 MG PO TABS: 650.0000 mg | ORAL_TABLET | Freq: Once | ORAL | Status: DC

## 2022-10-12 MED ORDER — DIPHENHYDRAMINE HCL 25 MG PO CAPS: 25.0000 mg | ORAL_CAPSULE | Freq: Once | ORAL | Status: DC

## 2022-10-12 MED ORDER — SODIUM CHLORIDE 0.9 % IV SOLN
200.0000 mg | Freq: Once | INTRAVENOUS | Status: AC
  Administered 2022-10-12: 200 mg via INTRAVENOUS
  Filled 2022-10-12: qty 10

## 2022-10-12 NOTE — Progress Notes (Signed)
Diagnosis: Iron Deficiency Anemia   Provider:  Pablo Lawrence NP   Procedure: Infusion   IV Type: Peripheral, IV Location: L Antecubital   Venofer (Iron Sucrose), Dose: 200 mg   Infusion Start Time: 0962   Infusion Stop Time: 1450   Post Infusion IV Care: Patient declined observation and Peripheral IV Discontinued   Discharge: Condition: Good, Destination: Home . AVS provided to patient.    Performed by:  Azzie Almas, RN

## 2022-10-12 NOTE — Addendum Note (Signed)
Encounter addended by: Baxter Hire, RN on: 10/12/2022 4:22 PM  Actions taken: Therapy plan modified

## 2022-10-13 ENCOUNTER — Other Ambulatory Visit: Payer: Self-pay | Admitting: *Deleted

## 2022-10-13 DIAGNOSIS — M0579 Rheumatoid arthritis with rheumatoid factor of multiple sites without organ or systems involvement: Secondary | ICD-10-CM

## 2022-10-13 DIAGNOSIS — E559 Vitamin D deficiency, unspecified: Secondary | ICD-10-CM

## 2022-10-13 DIAGNOSIS — M810 Age-related osteoporosis without current pathological fracture: Secondary | ICD-10-CM

## 2022-10-13 NOTE — Telephone Encounter (Signed)
Order placed, I called patient and advised for patient to call Forestine Na to schedule appt.

## 2022-10-13 NOTE — Telephone Encounter (Signed)
Yes we can recheck this. Please make sure test is at [facility] so we can have a comparison to last scan on the same machine. Thanks!

## 2022-10-16 ENCOUNTER — Other Ambulatory Visit: Payer: Self-pay

## 2022-10-16 ENCOUNTER — Telehealth: Payer: Self-pay | Admitting: Orthopedic Surgery

## 2022-10-16 DIAGNOSIS — M1712 Unilateral primary osteoarthritis, left knee: Secondary | ICD-10-CM

## 2022-10-16 DIAGNOSIS — M1711 Unilateral primary osteoarthritis, right knee: Secondary | ICD-10-CM

## 2022-10-16 NOTE — Telephone Encounter (Signed)
Too soon to repeat knee injections.  If she is still having continued knee pain, she should probably come back in so we can reevaluate.  Last hip x-rays were from 2019 so maybe we could check her hip and see if she has any degenerative changes that may be contributing to knee pain.

## 2022-10-16 NOTE — Telephone Encounter (Signed)
Pt called requesting a call from Rogelia Boga or Dr Marlou Sa today. Pt states she has bilateral knee pains and recently had injection with PA Like and she is wondering I she can have another injection or have Dr Laurance Flatten give her injection in her hip that helps with her knee pains. Please call pt at [phone].

## 2022-10-16 NOTE — Telephone Encounter (Signed)
I called pt and advised of message beow. She would like to come in and see Luke appt sch for this Friday at 1 pm

## 2022-10-17 ENCOUNTER — Ambulatory Visit (HOSPITAL_COMMUNITY)
Admission: RE | Admit: 2022-10-17 | Discharge: 2022-10-17 | Disposition: A | Discharge status: Home / Self Care | Payer: Medicare HMO | Source: Ambulatory Visit | Attending: Surgical | Admitting: Surgical

## 2022-10-17 ENCOUNTER — Ambulatory Visit (HOSPITAL_COMMUNITY)
Admission: RE | Admit: 2022-10-17 | Discharge: 2022-10-17 | Disposition: A | Discharge status: Home / Self Care | Payer: Medicare HMO | Source: Ambulatory Visit | Attending: Psychiatry | Admitting: Psychiatry

## 2022-10-17 DIAGNOSIS — R519 Headache, unspecified: Secondary | ICD-10-CM | POA: Insufficient documentation

## 2022-10-17 DIAGNOSIS — M542 Cervicalgia: Secondary | ICD-10-CM | POA: Insufficient documentation

## 2022-10-17 MED ORDER — GADOBUTROL 1 MMOL/ML IV SOLN
3.0000 mL | Freq: Once | INTRAVENOUS | Status: AC | PRN
  Administered 2022-10-17: 3 mL via INTRAVENOUS

## 2022-10-18 ENCOUNTER — Other Ambulatory Visit: Payer: Self-pay | Admitting: Psychiatry

## 2022-10-18 DIAGNOSIS — I639 Cerebral infarction, unspecified: Secondary | ICD-10-CM

## 2022-10-18 MED ORDER — ATORVASTATIN CALCIUM 20 MG PO TABS: 20.0000 mg | ORAL_TABLET | Freq: Every day | ORAL | 6 refills | Status: DC

## 2022-10-19 ENCOUNTER — Telehealth: Payer: Self-pay

## 2022-10-19 ENCOUNTER — Telehealth: Payer: Self-pay | Admitting: Orthopedic Surgery

## 2022-10-19 NOTE — Telephone Encounter (Signed)
See other message

## 2022-10-19 NOTE — Telephone Encounter (Signed)
Kristine with palliative care [facility] on triage phone and stated pt is dizzy after Mri with contrast times 3 days. Pt is drinking a lot of fluids but wants [place] to contact pt to discuss treatment for this dizziness

## 2022-10-19 NOTE — Telephone Encounter (Signed)
Pt returned call to [place]. Please call pt at [phone].

## 2022-10-19 NOTE — Telephone Encounter (Signed)
I called pt back and she stated understanding but was upset that we didn't have a answer for her. She stated she contacted her pcp who told her she wasn't sure. She stated she just wanted lukes professional opinion on it

## 2022-10-19 NOTE — Telephone Encounter (Signed)
Tried to call patient to give her Luke's message, no answer and her voicemail was full.

## 2022-10-19 NOTE — Telephone Encounter (Signed)
Looks like she had contrast with her brain MRI.  I would probably say she should reach out to either her PCP or Dr. Billey Gosling who ordered the MRI scan.

## 2022-10-20 ENCOUNTER — Ambulatory Visit: Payer: Medicare HMO | Admitting: Surgical

## 2022-10-22 NOTE — Telephone Encounter (Signed)
Got it thanks for the update

## 2022-10-23 ENCOUNTER — Telehealth: Payer: Self-pay | Admitting: Internal Medicine

## 2022-10-23 NOTE — Telephone Encounter (Signed)
Pt called inquiring if the bone density order can be coded differently in order for insurance to pay. She currently cannot get one until July but is concerned that will be too late. She would like additional resources or options if there are any. She can be reached at [phone].

## 2022-10-23 NOTE — Telephone Encounter (Signed)
Returned call to patient and advised patient we do not have any other codes to provide that would allow her to have a bone density scan earlier. Patient receives her Forteo from orthopedics. Patient advised to contact them to see if they have any other codes they can provide.

## 2022-10-25 ENCOUNTER — Encounter (HOSPITAL_COMMUNITY): Payer: Self-pay | Admitting: Hematology

## 2022-10-25 ENCOUNTER — Encounter: Payer: Self-pay | Admitting: Surgical

## 2022-10-25 ENCOUNTER — Ambulatory Visit (INDEPENDENT_AMBULATORY_CARE_PROVIDER_SITE_OTHER): Payer: Medicare HMO | Admitting: Surgical

## 2022-10-25 ENCOUNTER — Encounter (HOSPITAL_COMMUNITY): Payer: Self-pay | Admitting: Adult Health Nurse Practitioner

## 2022-10-25 ENCOUNTER — Telehealth: Payer: Self-pay | Admitting: Psychiatry

## 2022-10-25 ENCOUNTER — Telehealth: Payer: Self-pay | Admitting: Orthopedic Surgery

## 2022-10-25 DIAGNOSIS — M1711 Unilateral primary osteoarthritis, right knee: Secondary | ICD-10-CM

## 2022-10-25 DIAGNOSIS — M1712 Unilateral primary osteoarthritis, left knee: Secondary | ICD-10-CM

## 2022-10-25 DIAGNOSIS — M170 Bilateral primary osteoarthritis of knee: Secondary | ICD-10-CM | POA: Diagnosis not present

## 2022-10-25 MED ORDER — HYALURONAN 88 MG/4ML IX SOSY
88.0000 mg | PREFILLED_SYRINGE | INTRA_ARTICULAR | Status: AC | PRN
  Administered 2022-10-25: 88 mg via INTRA_ARTICULAR

## 2022-10-25 MED ORDER — LIDOCAINE HCL 1 % IJ SOLN
5.0000 mL | INTRAMUSCULAR | Status: AC | PRN
  Administered 2022-10-25: 5 mL

## 2022-10-25 NOTE — Progress Notes (Signed)
   Procedure Note  Patient: Vanessa Lara             Date of Birth: Apr 10, 1956           MRN: [id number]             Visit Date: 10/25/2022  Procedures: Visit Diagnoses:  1. Primary osteoarthritis of left knee   2. Primary osteoarthritis of right knee     Large Joint Inj: bilateral knee on 10/25/2022 4:57 PM Indications: diagnostic evaluation, joint swelling and pain Details: 18 G 1.5 in needle, superolateral approach  Arthrogram: No  Medications (Right): 5 mL lidocaine 1 %; 88 mg Hyaluronan 88 MG/4ML Medications (Left): 5 mL lidocaine 1 %; 88 mg Hyaluronan 88 MG/4ML Outcome: tolerated well, no immediate complications  Patient states that her shoulder pain is feeling a lot better compared with last visit.  We did review MRI today. Procedure, treatment alternatives, risks and benefits explained, specific risks discussed. Consent was given by the patient. Immediately prior to procedure a time out was called to verify the correct patient, procedure, equipment, support staff and site/side marked as required. Patient was prepped and draped in the usual sterile fashion.

## 2022-10-25 NOTE — Telephone Encounter (Signed)
Craig Staggers: [phone] exp. 10/25/22-11/24/22 sent to [facility] [phone]

## 2022-10-25 NOTE — Telephone Encounter (Signed)
Pt came in office stating to tell Dr Sharol Given is is winning off almost all her meds except. Steroids. Pt phone number is [phone].

## 2022-10-26 ENCOUNTER — Telehealth: Payer: Self-pay | Admitting: Orthopedic Surgery

## 2022-10-26 ENCOUNTER — Telehealth: Payer: Self-pay | Admitting: Psychiatry

## 2022-10-26 NOTE — Telephone Encounter (Signed)
Pt called to advise that she has weaned off almost all of her pain meds see message below. At her last visit you had discussed moving forward with -  forefoot surgery with fusion of the great toe MTP joint and a Weil osteotomy of the second third and fourth metatarsals with possible PIP resection of the second and third and fourth toes. Discussed that patient has about a 50% to improve her function and decrease pain. Patient states she understands wished to proceed at this time. Again discussed the importance of weaning off pain medicine so that pain medicine would work postoperatively. Patient states she would like to proceed with surgery as soon as possible after January 1.   Do you want to see her in the office first or is it ok to proceed with sch surgery?

## 2022-10-26 NOTE — Telephone Encounter (Signed)
Reviewed pt chart. Dr. Billey Gosling sent pt mychart message about MRI brain on 10/17/22: "Vanessa Lara, Your brain MRI does not show any cause for your headaches. However it does show a very small old stroke on the left side. We cannot tell when this stroke happened based on the MRI, just that it did not happen recently. I recommend starting daily aspirin 81 mg (baby aspirin) for stroke prevention. I also recommend increasing your atorvastatin dose to 20 mg daily for your cholesterol. I will send in a new prescription for the higher dose of atorvastatin to your pharmacy.  I would also like to test you for underlying causes of your stroke. These tests include an ultrasound of the heart (echocardiogram), a CT scan of the blood vessels in your head and neck, and a blood test to check for diabetes. I have placed these orders for you. You should receive a call the schedule the ultrasound and CT scan. The blood work can be done in our clinic any time during office house.   Please reach out if you have any questions. -Dr. Billey Gosling"   I called pt . She prefers to be called and does not want mychart messages sent moving forward. I relayed Dr. Georgina Peer message above. She verbalized understanding. She already picked up atorvastatin '20mg'$  po qd. She will start on ASA '81mg'$  po qd. She was not aware about needing to schedule CT Scans. I advised they were trying to reach her to schedule. She will call them back at [phone] to schedule.   She has foot surgery scheduled for 11/03/22. Advised it would be best for her to reach out to them to see if she needs to r/s and how to proceed since Dr. Billey Gosling wants her on ASA qd. Pt would like to know what Dr. Billey Gosling wants her to do in regards to surgery given these recent findings. Aware she is not back in the office until next Tues.   She will plan to get Hemoglobin A1C completed at [facility] walk in lab. This is closest to her home.  Aware I will send request to medical records to get her a  copy of MRI results.

## 2022-10-26 NOTE — Telephone Encounter (Signed)
Called pt back. Informed her of message from nurse Terrence Dupont. pt stated she would like her MRI results.Pt is requesting a call back from nurse. Pt is requesting MRI results be mailed to her also.

## 2022-10-26 NOTE — Telephone Encounter (Signed)
Pt is calling. Stated she wants to talk to nurse about why they ordered a Echo Study

## 2022-10-26 NOTE — Telephone Encounter (Signed)
Patient called wanting Dr. Sharol Given to know that she has had a stroke in the last 4 months. Patient asked if Dr. Sharol Given would look at her MRI. Patient said she had the MRI at [facility]. Patient asked if she should put off the surgery. Patient said Dr Genia Harold ordered 3 test already. Patient said the test will determine her vascular problems.  Dr. Billey Gosling has ordered her to take a baby aspirin (One every day)   Patient said she will wait to hear from Dr. Sharol Given before she takes the baby Aspirin. The number to contact patient is [phone]

## 2022-10-26 NOTE — Telephone Encounter (Signed)
Please call pt. Dr Billey Gosling wants to make sure there is no abnormalities with her heart.

## 2022-10-27 ENCOUNTER — Ambulatory Visit: Payer: Medicare HMO | Admitting: Surgical

## 2022-10-27 NOTE — Telephone Encounter (Signed)
Pt is calling. Requesting her lab order be sent to [facility].

## 2022-10-30 NOTE — Telephone Encounter (Signed)
Pt called back. Stated she will call and get fax number and call us back.

## 2022-10-30 NOTE — Telephone Encounter (Signed)
Please call pt back to get fax number we should send the lab orders to. Thank you

## 2022-10-31 ENCOUNTER — Telehealth: Payer: Self-pay | Admitting: Surgical

## 2022-10-31 ENCOUNTER — Telehealth: Payer: Self-pay

## 2022-10-31 NOTE — Telephone Encounter (Signed)
Still having right knee pains and complaints of swelling. Pt states she need medical advice. Please call pt at [phone].

## 2022-10-31 NOTE — Telephone Encounter (Signed)
In regards to the foot surgery, I would have her let the surgeon know she has a history of an old stroke and is taking aspirin. She may need to stop the aspirin prior to surgery, and the surgeon will typically determine if and when she should stop it.

## 2022-10-31 NOTE — Telephone Encounter (Signed)
I'd give the gel injection about 2-3 more  weeks to kick in until we see if it worked or not.  In the meantime, recommend heat/ice, compression, can take tylenol

## 2022-10-31 NOTE — Telephone Encounter (Signed)
IC talked with patient and advised. She verbalized understanding.

## 2022-10-31 NOTE — Telephone Encounter (Signed)
Can you get medical clearance from Dr. Billey Gosling on this pt prior to her surgery per Dr. Sharol Given for recent stroke?

## 2022-10-31 NOTE — Telephone Encounter (Signed)
Tried pt again, went right to voicemail and mailbox full. Could not leave message.

## 2022-10-31 NOTE — Telephone Encounter (Signed)
Tried calling pt, went to VM but VM full and unable to leave message. Pt previously requested no mychart messages be sent

## 2022-11-01 ENCOUNTER — Telehealth: Payer: Self-pay | Admitting: Orthopedic Surgery

## 2022-11-01 NOTE — Telephone Encounter (Signed)
I spoke with [name].  She states that Dr. Billey Gosling is sending her for an ultrasound and she is having her "vessels" looked at due to possible previous stroke.  I advised her that we would cancel elective surgery scheduled for 11/03/22. She will call me once her testing is complete and she is cleared to reschedule surgery.

## 2022-11-01 NOTE — Telephone Encounter (Signed)
Took call from phone staff and spoke w/ pt. She asked to go over MRI results again. I relayed results. She confirmed she started on daily baby ASA ('81mg'$ ) po last week. She has ECHO scheduled for this Friday and CT head/neck for 11/08/22 at [facility]. Aware we will call her with results once they are back.  She has decided to cx foot surgery scheduled for 11/03/22. She has left several messages for surgeon office but still waiting to hear back. I relayed Dr. Georgina Peer recommendation: "In regards to the foot surgery, I would have her let the surgeon know she has a history of an old stroke and is taking aspirin. She may need to stop the aspirin prior to surgery, and the surgeon will typically determine if and when she should stop it. " She verbalized understanding.   She is still planning to get hemoglobin A1C checked at [facility], they have access to the order. She is waiting to hear from PCP office to see if they can add order to check her iron level prior to going.

## 2022-11-01 NOTE — Telephone Encounter (Signed)
Patient states she is being treated for a blood clot and she thinks she need to cancel her foot surgery for Friday. Please call patient

## 2022-11-01 NOTE — Telephone Encounter (Signed)
This pt is sch for surgery on Friday. I do not see that she has had an U/S for DVT but see message below the pt states that she is being treated for a clot. She was also needing clearance pt is post stroke. Do you want to cancel surgery and have her come in for visit with Sharol Given? This is elective and maybe should wait until after other medical issues are cleared.

## 2022-11-02 ENCOUNTER — Other Ambulatory Visit (HOSPITAL_COMMUNITY)
Admission: RE | Admit: 2022-11-02 | Discharge: 2022-11-02 | Disposition: A | Discharge status: Home / Self Care | Payer: Medicare HMO | Source: Ambulatory Visit | Attending: Psychiatry | Admitting: Psychiatry

## 2022-11-02 DIAGNOSIS — I639 Cerebral infarction, unspecified: Secondary | ICD-10-CM | POA: Insufficient documentation

## 2022-11-02 LAB — HEMOGLOBIN A1C
Hgb A1c MFr Bld: 5.1 % (ref 4.8–5.6)
Mean Plasma Glucose: 99.67 mg/dL

## 2022-11-03 ENCOUNTER — Ambulatory Visit (HOSPITAL_COMMUNITY)
Admission: RE | Admit: 2022-11-03 | Discharge: 2022-11-03 | Disposition: A | Discharge status: Home / Self Care | Payer: Medicare HMO | Source: Ambulatory Visit | Attending: Psychiatry | Admitting: Psychiatry

## 2022-11-03 ENCOUNTER — Ambulatory Visit (HOSPITAL_COMMUNITY): Admission: RE | Admit: 2022-11-03 | Payer: Medicare HMO | Source: Ambulatory Visit | Admitting: Orthopedic Surgery

## 2022-11-03 ENCOUNTER — Encounter (HOSPITAL_COMMUNITY): Admission: RE | Payer: Self-pay | Source: Ambulatory Visit

## 2022-11-03 ENCOUNTER — Other Ambulatory Visit (HOSPITAL_COMMUNITY): Payer: Self-pay

## 2022-11-03 DIAGNOSIS — Z859 Personal history of malignant neoplasm, unspecified: Secondary | ICD-10-CM | POA: Insufficient documentation

## 2022-11-03 DIAGNOSIS — E46 Unspecified protein-calorie malnutrition: Secondary | ICD-10-CM | POA: Diagnosis not present

## 2022-11-03 DIAGNOSIS — I6389 Other cerebral infarction: Secondary | ICD-10-CM

## 2022-11-03 DIAGNOSIS — I509 Heart failure, unspecified: Secondary | ICD-10-CM | POA: Diagnosis not present

## 2022-11-03 DIAGNOSIS — E785 Hyperlipidemia, unspecified: Secondary | ICD-10-CM | POA: Diagnosis not present

## 2022-11-03 DIAGNOSIS — I639 Cerebral infarction, unspecified: Secondary | ICD-10-CM | POA: Insufficient documentation

## 2022-11-03 LAB — ECHOCARDIOGRAM COMPLETE BUBBLE STUDY
Area-P 1/2: 3.6 cm2
S' Lateral: 2 cm

## 2022-11-03 SURGERY — FUSION, JOINT, GREAT TOE
Anesthesia: Choice | Site: Toe | Laterality: Right

## 2022-11-03 NOTE — Progress Notes (Signed)
*  PRELIMINARY RESULTS* Echocardiogram 2D Echocardiogram has been performed with saline bubble study.  Vanessa Lara 11/03/2022, 11:35 AM

## 2022-11-06 ENCOUNTER — Telehealth: Payer: Self-pay | Admitting: Psychiatry

## 2022-11-06 ENCOUNTER — Telehealth: Payer: Self-pay | Admitting: Surgical

## 2022-11-06 NOTE — Telephone Encounter (Signed)
Patient called in to set appt with Lurena Joiner stating her injection she got on 01/24 did not work and he advised her to let us know so he may find another alternative possibly to see [facility] or another Dr, she scheduled an appt on 02/12 just in case Lurena Joiner did want to see her please advise if she needs appt with Lurena Joiner or another alternative, I advised patient she would most likely need appt on 02/12 because Lurena Joiner would want to see her before sending her else where

## 2022-11-06 NOTE — Telephone Encounter (Signed)
I think if that did not work, she should probably see Dr. Marlou Sa so we can see if he has any other ideas.  May need to look at her hip as a source of pain given the lack of relief from her knee injections.

## 2022-11-06 NOTE — Telephone Encounter (Signed)
Noted  

## 2022-11-06 NOTE — Telephone Encounter (Signed)
Per Dr. Billey Gosling note 11/03/22: "Hemoglobin A1c level is normal"   I called pt. Mailbox full, unable to leave message. If pt calls, please relay results.

## 2022-11-06 NOTE — Telephone Encounter (Signed)
Pt checking on blood work results

## 2022-11-06 NOTE — Telephone Encounter (Signed)
Pt returned phone call. Relayed results to pt as instructed by nurse. Pt was very appreciative.

## 2022-11-08 ENCOUNTER — Ambulatory Visit (HOSPITAL_COMMUNITY): Payer: Medicare HMO

## 2022-11-08 NOTE — Telephone Encounter (Signed)
She had an SI joint with Dr Rolena Infante which I dont think would be causing her knee pain.  We can just see her back at her next visit and check some hip xrays and see what it shows.  She can keep her appointment with me or move to a different day with Dr Marlou Sa, it's up to her ultimately

## 2022-11-09 NOTE — Telephone Encounter (Signed)
IC patient prefers to be seen by [facility]. She will keep her appointment already scheduled.

## 2022-11-13 ENCOUNTER — Ambulatory Visit: Payer: Medicare HMO | Admitting: Surgical

## 2022-11-14 DIAGNOSIS — Z8673 Personal history of transient ischemic attack (TIA), and cerebral infarction without residual deficits: Secondary | ICD-10-CM | POA: Insufficient documentation

## 2022-11-15 ENCOUNTER — Ambulatory Visit (HOSPITAL_COMMUNITY)
Admission: RE | Admit: 2022-11-15 | Discharge: 2022-11-15 | Disposition: A | Discharge status: Home / Self Care | Payer: Medicare HMO | Source: Ambulatory Visit | Attending: Psychiatry | Admitting: Psychiatry

## 2022-11-15 ENCOUNTER — Other Ambulatory Visit: Payer: Self-pay | Admitting: Psychiatry

## 2022-11-15 ENCOUNTER — Ambulatory Visit: Payer: Medicare HMO | Admitting: Internal Medicine

## 2022-11-15 DIAGNOSIS — I639 Cerebral infarction, unspecified: Secondary | ICD-10-CM

## 2022-11-15 MED ORDER — IOHEXOL 350 MG/ML SOLN
75.0000 mL | Freq: Once | INTRAVENOUS | Status: AC | PRN
  Administered 2022-11-15: 75 mL via INTRAVENOUS

## 2022-11-15 NOTE — Progress Notes (Deleted)
Office Visit Note  Patient: Vanessa Lara             Date of Birth: May 20, 1956           MRN: [id number]             PCP: Pablo Lawrence, NP Referring: Pablo Lawrence, NP Visit Date: 11/15/2022   Subjective:  No chief complaint on file.   History of Present Illness: Vanessa Lara is a 67 y.o. female here for follow up ***   Previous HPI 10/03/22  Vanessa Lara is a 67 y.o. female here for follow up for seropositive RA currently off long term DMARD with prednisone 5 mg daily as needed.  She is overall not doing well feels like pain and stiffness is a big problem but more severe is her fatigue and feels a generalized getting weaker with difficulty moving and with her upper body strength.  She pretty consistently has to take the prednisone for otherwise does not move around much during the day.  Continues to get a benefit with the Flexeril at nighttime with slight decrease in frequency of awakenings.  She is also concerned about getting an update to her bone density testing ongoing oral alendronate treatment.  She is also still having a lot of pain and difficulty with walking from her claw toe deformities on the foot and planning to follow-up with surgery clinic about management of this.   Previous HPI 06/28/22 Vanessa Lara is a 67 y.o. female here for follow up for seropositive RA now off of treatment after stopping the [place].  Since she stopped taking the medication her complaint of night sweats and fatigue have improved. She has seen a worsening of joint pain in several areas particularly around the MCP joints of both hands. For knee pain she saw orthopedic surgery clinic yesterday and had injections on both sides.  She also discussed getting set up to do right SI joint steroid injection.  Biggest complaint today is back pain on the right side around the middle of the back and around the scapula.  This bothers her especially with certain movements and trying to bend side to side.     Previous HPI 05/18/2022  Vanessa Lara is a 67 y.o. female here for follow up for seropositive rheumatoid arthritis on Kevzara 150 mg [place] q14days. She feels arthritis has been fairly well controlled but has pain worse in her left foot mostly. She had GI issues suspected UTI but with negative culture since last visit. She feels a sensation "like her brain is numb." Also has ongoing upper neck pain without radiating symptoms.    Previous HPI 02/07/2022 Vanessa Lara is a 67 y.o. female here for follow up for rheumatoid arthritis with chronic joint pain in multiple areas and chronic fatigue labs showing elevated rheumatoid factor. Kevzara new start on 12/15/2021. She felt an improvement when taking the injections within first weeks. Accidentally took the shots twice in a week due to forgetfulness or mistake but did not notice any major problems. She called in on account of increased joint pain but felt no improvement with the prescribed medrol dose pack. She did feel more lightheadedness or off balance feeling while taking this so stopped after about 3 days. Currently back pain and her left foot pain are worst issues.   Previous HPI 12/05/2021 Vanessa Lara is a 67 y.o. female here for evaluation of rheumatoid arthritis with chronic joint pain in multiple areas and chronic  fatigue labs showing elevated rheumatoid factor. She has history of mitral valve prolapse, severe osteoporosis, osteoarthritis, and severe weight loss and deconditioning. She was diagnosed with seropositive RA in 2018 with Dr. Kathlene November and most recently saw Dr. Amil Amen in 11/2020. At that time recommended to start hydroxychloroquine for inflammatory arthritis symptoms. She previously tried methotrexate, simponi aria, and orencia with poor tolerance. Imaging consistent with degenerative arthritis in cervical spine and knee but also with some partial tendon tear in shoulder and some synovitis present in knee on MRI imaging. She has taken multiple  medications for pain including NSAIDs, prednisone, gabapentin, tramadol, and hydrocodone or oxycodone. She has a lot of pain in multiple areas but is also severely fatigued. She has significant weight loss down to 70 lbs unintentionally reports chronic IBS issues but no recent changes during this period of weight loss. Joint pain also limits her activity quite a bit with chronic deformities and also ongoing swelling in bilateral hands. She has also developed left sided headache localized above the temporal area. She has a lot of pain at the base of the skull and occiput without clear radiation of symptoms. She is scheduled to see Dr. Billey Gosling for headache evaluation and management 3/28.   DMARD Hx MTX Simponi Orencia Kevzara     05/2017 RF 15 CCP 32 HBV neg HCV neg   No Rheumatology ROS completed.   PMFS History:  Patient Active Problem List   Diagnosis Date Noted   Medication monitoring encounter 12/05/2021   Physical deconditioning 11/11/2021   Elevated serum lactate dehydrogenase (LDH) 08/17/2021   Vitamin D deficiency 08/17/2021   Chronic fatigue 07/25/2021   Vitamin B12 deficiency 07/19/2021   Hardening of the aorta (main artery of the heart) ([place]) 06/21/2021   Solitary lung nodule 06/15/2021   Loss of weight 05/18/2021   Protein-calorie malnutrition, severe ([place]) 05/18/2021   Osteoporosis 06/15/2020   Iron deficiency anemia 04/09/2020   Primary osteoarthritis involving multiple joints 12/31/2018   Rheumatoid arthritis involving multiple sites with positive rheumatoid factor ([place]) 12/31/2018   Irritable bowel syndrome with both constipation and diarrhea 10/28/2018   Constipation 03/14/2016   Mitral valve prolapse 11/23/2015   High cholesterol 11/23/2015    Past Medical History:  Diagnosis Date   Anemia    Anxiety    Arthritis    RA   Cancer (HCC)    Skin   CHF (congestive heart failure) (HCC)    Collagen vascular disease (HCC)    Depression    Endometriosis     High cholesterol    IBS (irritable bowel syndrome)    Lymphadenopathy    Mitral valve prolapse    RA (rheumatoid arthritis) (HCC)    Rotator cuff tear    bilateral   Uterus, adenomyosis     Family History  Problem Relation Age of Onset   Colon cancer Other    Bone cancer Mother    Brain cancer Father    Brain cancer Sister    Breast cancer Sister    Renal Disease Sister    Pancreatic cancer Neg Hx    Esophageal cancer Neg Hx    Stomach cancer Neg Hx    Rectal cancer Neg Hx    Past Surgical History:  Procedure Laterality Date   AXILLARY LYMPH NODE BIOPSY Right 10/12/2020   Procedure: RIGHT AXILLARY LYMPH NODE BIOPSY EXCISION;  Surgeon: Stark Klein, MD;  Location: [place];  Service: General;  Laterality: Right;   BUNIONECTOMY Right    CARPAL  TUNNEL RELEASE Right    COLONOSCOPY     COLONOSCOPY N/A 12/13/2015   Procedure: COLONOSCOPY;  Surgeon: Rogene Houston, MD;  [facility];  Service: Endoscopy;  Laterality: N/A;  9:55   Fatty tumor     2011 (left arm)   FOOT SURGERY     hammertoe   TONSILLECTOMY     TOTAL ABDOMINAL HYSTERECTOMY     2003   Social History   Social History Narrative   Not on file   Immunization History  Administered Date(s) Administered   Influenza, Quadrivalent, Recombinant, Inj, Pf 07/11/2017     Objective: Vital Signs: There were no vitals taken for this visit.   Physical Exam   Musculoskeletal Exam: ***  CDAI Exam: CDAI Score: -- Patient Global: --; Provider Global: -- Swollen: --; Tender: -- Joint Exam 11/15/2022   No joint exam has been documented for this visit   There is currently no information documented on the homunculus. Go to the Rheumatology activity and complete the homunculus joint exam.  Investigation: No additional findings.  Imaging: ECHOCARDIOGRAM COMPLETE BUBBLE STUDY  Result Date: 11/03/2022    ECHOCARDIOGRAM REPORT   Patient Name:   EULOGIA RON Date of Exam: 11/03/2022 Medical  Rec #:  [id number]      Height:       56.0 in Accession #:    [id number]     Weight:       71.2 lb Date of Birth:  26-May-1956     BSA:          1.144 m Patient Age:    67 years       BP:           104/70 mmHg Patient Gender: F              HR:           100 bpm. Exam Location:  [facility] Procedure: 2D Echo, Cardiac Doppler and Color Doppler Indications:    I63.9 Stroke  History:        Patient has prior history of Echocardiogram examinations, most                 recent 08/16/2021. CHF; Risk Factors:Dyslipidemia. Cancer ([place])                 (From Hx), Loss of weight-Protein-calorie malnutrition, severe.  Sonographer:    Alvino Chapel RCS Referring Phys: [id number] [place]  1. Left ventricular ejection fraction, by estimation, is 55 to 60%. The left ventricle has normal function. Left ventricular endocardial border not optimally defined to evaluate regional wall motion. Left ventricular diastolic parameters are consistent with Grade I diastolic dysfunction (impaired relaxation).  2. Right ventricular systolic function was not well visualized. The right ventricular size is normal. Tricuspid regurgitation signal is inadequate for assessing PA pressure.  3. The mitral valve is normal in structure. No evidence of mitral valve regurgitation. No evidence of mitral stenosis.  4. The aortic valve is tricuspid. Aortic valve regurgitation is not visualized. No aortic stenosis is present.  5. The inferior vena cava is normal in size with <50% respiratory variability, suggesting right atrial pressure of 8 mmHg.  6. Agitated saline contrast bubble study was negative, with no evidence of any interatrial shunt. Comparison(s): No significant change from prior study. FINDINGS  Left Ventricle: Left ventricular ejection fraction, by estimation, is 55 to 60%. The left ventricle has normal function. Left ventricular endocardial border not optimally defined to  evaluate regional wall motion. The left ventricular internal  cavity size was normal in size. There is no left ventricular hypertrophy. Left ventricular diastolic parameters are consistent with Grade I diastolic dysfunction (impaired relaxation). Right Ventricle: The right ventricular size is normal. No increase in right ventricular wall thickness. Right ventricular systolic function was not well visualized. Tricuspid regurgitation signal is inadequate for assessing PA pressure. Left Atrium: Left atrial size was normal in size. Right Atrium: Right atrial size was normal in size. Pericardium: Trivial pericardial effusion is present. Mitral Valve: The mitral valve is normal in structure. No evidence of mitral valve regurgitation. No evidence of mitral valve stenosis. Tricuspid Valve: The tricuspid valve is grossly normal. Tricuspid valve regurgitation is not demonstrated. No evidence of tricuspid stenosis. Aortic Valve: The aortic valve is tricuspid. Aortic valve regurgitation is not visualized. No aortic stenosis is present. Pulmonic Valve: The pulmonic valve was not well visualized. Pulmonic valve regurgitation is not visualized. No evidence of pulmonic stenosis. Aorta: The aortic root is normal in size and structure. Venous: The inferior vena cava is normal in size with less than 50% respiratory variability, suggesting right atrial pressure of 8 mmHg. IAS/Shunts: No atrial level shunt detected by color flow Doppler. Agitated saline contrast was given intravenously to evaluate for intracardiac shunting. Agitated saline contrast bubble study was negative, with no evidence of any interatrial shunt.  LEFT VENTRICLE PLAX 2D LVIDd:         3.60 cm   Diastology LVIDs:         2.00 cm   LV e' medial:    7.62 cm/s LV PW:         0.70 cm   LV E/e' medial:  8.7 LV IVS:        0.70 cm   LV e' lateral:   9.90 cm/s LVOT diam:     2.00 cm   LV E/e' lateral: 6.7 LV SV:         56 LV SV Index:   49 LVOT Area:     3.14 cm  RIGHT VENTRICLE RV S prime:     11.50 cm/s TAPSE (M-mode): 1.6 cm LEFT  ATRIUM             Index        RIGHT ATRIUM          Index LA diam:        2.30 cm 2.01 cm/m   RA Area:     7.68 cm LA Vol (A2C):   21.8 ml 19.05 ml/m  RA Volume:   11.90 ml 10.40 ml/m LA Vol (A4C):   17.4 ml 15.20 ml/m LA Biplane Vol: 20.5 ml 17.91 ml/m  AORTIC VALVE LVOT Vmax:   89.40 cm/s LVOT Vmean:  53.800 cm/s LVOT VTI:    0.177 m  AORTA Ao Root diam: 2.60 cm MITRAL VALVE MV Area (PHT): 3.60 cm    SHUNTS MV Decel Time: 211 msec    Systemic VTI:  0.18 m MV E velocity: 66.00 cm/s  Systemic Diam: 2.00 cm MV A velocity: 88.70 cm/s MV E/A ratio:  0.74 Vishnu Priya Mallipeddi Electronically signed by Lorelee Cover Mallipeddi Signature Date/Time: 11/03/2022/1:47:33 PM    Final    MR BRAIN W WO CONTRAST  Result Date: 10/18/2022 CLINICAL DATA:  Worsening headaches R51.9 (ICD-10-CM). EXAM: MRI HEAD WITHOUT AND WITH CONTRAST TECHNIQUE: Multiplanar, multiecho pulse sequences of the brain and surrounding structures were obtained without and with intravenous contrast. CONTRAST:  <See Chart> GADAVIST GADOBUTROL  1 MMOL/ML IV SOLN COMPARISON:  Head CT December 15, 2021; MRI of the brain August 27, 2018. FINDINGS: Brain: No acute infarction, hemorrhage, hydrocephalus, extra-axial collection or mass lesion. Punctate remote infarct the left corona radiata. Few punctate foci of T2 hyperintensity are seen in the white matter of the cerebral hemispheres, nonspecific. No focus of abnormal contrast enhancement identified. Vascular: Normal flow voids. Skull and upper cervical spine: Normal marrow signal. Sinuses/Orbits: Negative. Other: None. IMPRESSION: 1. No acute intracranial abnormality. 2. Few punctate foci of T2 hyperintensity in the white matter of the cerebral hemispheres, nonspecific, may represent early chronic microangiopathy. 3. Punctate remote infarct the left corona radiata. Electronically Signed   By: Pedro Earls M.D.   On: 10/18/2022 10:10   MR Cervical Spine w/o contrast  Result Date:  10/18/2022 CLINICAL DATA:  Neck pole, radiating into both shoulders into the medial border of the scapula EXAM: MRI CERVICAL SPINE WITHOUT CONTRAST TECHNIQUE: Multiplanar, multisequence MR imaging of the cervical spine was performed. No intravenous contrast was administered. COMPARISON:  12/15/2021 MRI cervical spine FINDINGS: Evaluation is somewhat limited by motion artifact. Alignment: Trace retrolisthesis C5 on C6, unchanged. Vertebrae: No fracture, evidence of discitis, or bone lesion. Cord: Normal signal and morphology. Posterior Fossa, vertebral arteries, paraspinal tissues: Negative. Disc levels: C2-C3: No significant disc bulge. No spinal canal stenosis or neuroforaminal narrowing. C3-C4: Minimal disc bulge. Mild facet and uncovertebral hypertrophy, right greater than left. No spinal canal stenosis. No significant neural foraminal narrowing. C4-C5: The disc height loss. Right greater than left uncovertebral and facet arthropathy. No spinal canal stenosis or neural foraminal narrowing. C5-C6: Disc height loss and mild disc osteophyte complex. Facet and uncovertebral hypertrophy. No spinal canal stenosis. Mild left and moderate right neural foraminal narrowing, which appears similar to prior. C6-C7: Small disc osteophyte complex. No spinal canal stenosis or neural foraminal narrowing. C7-T1: No significant disc bulge. No spinal canal stenosis or neuroforaminal narrowing. IMPRESSION: 1. C5-C6 moderate right and mild left neural foraminal narrowing. 2. No spinal canal stenosis. Electronically Signed   By: Merilyn Baba M.D.   On: 10/18/2022 03:23    Recent Labs: Lab Results  Component Value Date   WBC 14.2 (H) 10/03/2022   HGB 11.5 (L) 10/03/2022   PLT 362 10/03/2022   NA 140 10/03/2022   K 4.3 10/03/2022   CL 102 10/03/2022   CO2 29 10/03/2022   GLUCOSE 93 10/03/2022   BUN 10 10/03/2022   CREATININE 0.69 10/03/2022   BILITOT 0.2 10/03/2022   ALKPHOS 148 (H) 08/23/2020   AST 16 10/03/2022   ALT  10 10/03/2022   PROT 7.2 10/03/2022   ALBUMIN 3.6 08/23/2020   CALCIUM 10.2 10/03/2022   GFRAA 92 11/06/2019   QFTBGOLDPLUS NEGATIVE 12/05/2021    Speciality Comments: No specialty comments available.  Procedures:  No procedures performed Allergies: Abatacept, Codeine, Golimumab, Hydrocodone, Methylprednisolone, Methotrexate derivatives, and Prednisone   Assessment / Plan:     Visit Diagnoses: No diagnosis found.  ***  Orders: No orders of the defined types were placed in this encounter.  No orders of the defined types were placed in this encounter.    Follow-Up Instructions: No follow-ups on file.   Collier Salina, MD  Note - This record has been created using [employer].  Chart creation errors have been sought, but may not always  have been located. Such creation errors do not reflect on  the standard of medical care.

## 2022-11-20 ENCOUNTER — Telehealth: Payer: Self-pay | Admitting: Psychiatry

## 2022-11-20 NOTE — Telephone Encounter (Signed)
Pt is calling requesting a follow-up on CT and bubble study results.

## 2022-11-20 NOTE — Telephone Encounter (Signed)
The CTA is normal, with no sign of blockage or significant narrowing of the blood vessels in the head and neck. Echocardiogram/bubble study is stable from the previous echocardiogram in 2022 and does not show any cause for stroke.

## 2022-11-20 NOTE — Telephone Encounter (Signed)
Took call and spoke with pt about results per Dr. Georgina Peer note. Pt verbalized understanding.

## 2022-11-20 NOTE — Telephone Encounter (Signed)
The spot is not seen on the CT scan, but small strokes can sometimes not show up on CT scans. They are more easily seen on MRI scans, which is how we initially saw her stroke. So even though the spot isn't visible on the current CT, this does not mean she didn't have a stroke. It just means it was so small we can only see it on an MRI

## 2022-11-27 ENCOUNTER — Ambulatory Visit: Payer: Medicare HMO | Admitting: Surgical

## 2022-11-28 ENCOUNTER — Other Ambulatory Visit (HOSPITAL_COMMUNITY): Payer: Self-pay

## 2022-11-28 ENCOUNTER — Other Ambulatory Visit: Payer: Self-pay

## 2022-11-28 NOTE — Progress Notes (Unsigned)
[place], [place] [address]   CLINIC:  Medical Oncology/Hematology  PCP:  Pablo Lawrence, NP [place] [address] [phone]   REASON FOR VISIT:  Follow-up for iron deficiency anemia and lymphadenopathy  CURRENT THERAPY: Intermittent Feraheme  INTERVAL HISTORY:   Vanessa Lara 67 y.o. female returns for routine follow-up of iron deficiency anemia and lymphadenopathy.  She was last seen by Tarri Abernethy PA-C on 09/09/2021.  She has been lost to follow-up for the past year, but reached out to our office last week after her PCP checked labs they found her to have significant iron deficiency again.  At today's visit, she reports feeling poorly. *** *** She continues to suffer from malnutrition, weight loss, severe fatigue, and general malaise.   ***She is seeing multiple specialists for workup and management of these chronic issues.   Regarding her anemia of chronic disease and iron deficiency, she has not noted any blood loss such as epistaxis, hematemesis, hematochezia, or melena. *** *** She denies any new lumps or bumps.   ***She has been gaining some weight after her PCP started her on marinol. ***  She denies any other B symptoms such as fever, chills, or night sweats.   ***She remains severely fatigued.  She has ***% energy and ***% appetite. She endorses that she is maintaining a stable weight.   ASSESSMENT & PLAN:  1.   Normocytic anemia: Anemia of chronic disease +/- iron deficiency - History of significant iron deficiency with mild anemia (Hgb ranging from 11.0-12.0) - EGD (11/26/2020): Erythematous mucosa in gastric body, mildly ringed appearance in the esophagus, normal examined duodenum - Capsule endoscopy (02/08/2021): Complete capsule endoscopy with no findings to explain patient's symptoms - Colonoscopy (08/12/2020): Nonbleeding external and internal hemorrhoids, diverticulosis - CT enterography  abdomen/pelvis (01/04/2021): No abnormal findings to explain chronic iron deficiency - We reviewed her prior anemia work-up which showed normal 123456, folic acid and methylmalonic acid levels.  Copper levels were normal.  SPEP was negative. - Most recent IV iron with Venofer 1000 mg in divided doses from December 2022 - March 2023 - No bright red blood per rectum or melena ***  - She has severe fatigue, which is likely unrelated to her iron deficiency and anemia *** - Most recent labs (via [facility] on 11/14/2022) shows Hgb 9.1/MCV 89.  Iron saturation is low at 8%.  (Ferritin not checked) *** - Differential diagnosis favors anemia of chronic disease with functional iron deficiency and likely malabsorption secondary to chronic inflammation - PLAN: We will tentatively schedule her for Venofer 300 mg x 3 doses. - Check labs today for baseline ferritin *** - Labs and RTC in 3 months ***  2.  Weight loss - Patient has had intermittent episodes of weight loss since 2020 - She follows with her PCP, GI, rheumatology, and endocrinology for severe IBS and weight loss - She is up-to-date on routine cancer screening such as colonoscopy and mammogram. - She does not have any clear focal symptoms that would require imaging at this time. - Due to strong family history of malignancy, she was previously recommended for [facility] testing, but this was not covered by insurance and was cost prohibitive. - She has had extensive previous imaging including PET scan in October 2021, all of which has been negative for malignancy - Weight loss suspected to be due to her underlying inflammatory condition - She is taking Marinol as prescribed by  PCP - Weight today *** - PLAN: Weight loss and malnutrition is being managed by her PCP.***  3.  Leukocytosis & thrombocytosis - Past CBCs reveal intermittent leukocytosis (neutrophil predominant) and thrombocytosis  - MPN work-up was negative for JAK2 V617F, JAK2 exon 12, MPL and  CALR.  Reflex testing to NGS did not reveal any abnormal mutations. - BCR/ABL FISH was negative - Inflammatory markers (08/23/2020) significantly elevated with ESR 62 and CRP 1.7.  LDH was normal at 157. - Findings are consistent with chronic inflammation and reactive leukocytosis/thrombocytosis - No indication for bone marrow biopsy or further work-up at this time - Most recent CBC (11/14/2022) with elevated WBC 11.2/ANC 7.9, platelets 458 - PLAN: Reactive leukocytosis/thrombocytosis, without indication for bone marrow biopsy or further work-up/treatment at this time.  4.  PET positive right axillary lymph node - PET scan obtained 07/09/2020 due to unintentional weight loss showed mildly hypermetabolic right axillary lymph node - Right axillar lymph node was palpable on exam (November 2021) - She has completed 5 days of azithromycin as she had a history of cat bite. - She was advised by Dr. Lorenso Courier that her lymph node was likely reactive and related to her underlying inflammatory disease, but patient insisted on biopsy. - Patient opted for right axillary lymph node biopsy (10/12/2020), which showed benign reactive lymph node, no malignancy***   5.  PET positive ileocecal lesion - PET scan obtained 07/09/2020 for unintentional weight loss showed accentuated activity along the distalmost terminal ileum and ileocecal valve, possibly physiologic since there was no mass or specific abnormality in this region on diagnostic CT as of 06/23/2020. - Patient had colonoscopy on 08/12/2020, in which the cecum and IC valvle appeared normal. Terminal ileum could not be intubated due to restricted sigmoid and redundant colon. - CT abdomen/pelvis with contrast (01/04/2021): No abnormal bowel wall thickening or abnormal mural enhancement in the stomach, small bowel, or colon.  No findings to suggest inflammatory or chronic fibrous small bowel stricture.  No mesenteric or intraperitoneal free fluid.  Terminal ileum was  normal, no edema or inflammation in the region of the cecum.   6.  Osteoporosis: - DEXA scan on 04/13/2020 shows T score -4.2. - She follows with endocrinology and orthopedics   7.  Rheumatoid arthritis - She follows with Dr. Amil Amen / Leafy [facility] of [facility] rheumatology  PLAN SUMMARY: >> *** >> *** >> ***   [facility] at [place] **   You were seen today by Tarri Abernethy PA-C for your ***.    *** ***  *** ***  LABS: Return in ***   OTHER TESTS: ***  MEDICATIONS: ***  FOLLOW-UP APPOINTMENT: ***     REVIEW OF SYSTEMS: ***  Review of Systems - Oncology   PHYSICAL EXAM:  ECOG PERFORMANCE STATUS: {CHL ONC ECOG [id number] *** There were no vitals filed for this visit. There were no vitals filed for this visit. Physical Exam  PAST MEDICAL/SURGICAL HISTORY:  Past Medical History:  Diagnosis Date   Anemia    Anxiety    Arthritis    RA   Cancer (HCC)    Skin   CHF (congestive heart failure) (HCC)    Collagen vascular disease (HCC)    Depression    Endometriosis    High cholesterol    IBS (irritable bowel syndrome)    Lymphadenopathy    Mitral valve prolapse    RA (rheumatoid arthritis) (HCC)    Rotator cuff  tear    bilateral   Uterus, adenomyosis    Past Surgical History:  Procedure Laterality Date   AXILLARY LYMPH NODE BIOPSY Right 10/12/2020   Procedure: RIGHT AXILLARY LYMPH NODE BIOPSY EXCISION;  Surgeon: Stark Klein, MD;  Location: [facility];  Service: General;  Laterality: Right;   BUNIONECTOMY Right    CARPAL TUNNEL RELEASE Right    COLONOSCOPY     COLONOSCOPY N/A 12/13/2015   Procedure: COLONOSCOPY;  Surgeon: Rogene Houston, MD;  [facility];  Service: Endoscopy;  Laterality: N/A;  9:55   Fatty tumor     2011 (left arm)   FOOT SURGERY     hammertoe   TONSILLECTOMY     TOTAL ABDOMINAL HYSTERECTOMY     2003    SOCIAL HISTORY:   Social History   Socioeconomic History   Marital status: Single    Spouse name: Not on file   Number of children: Not on file   Years of education: Not on file   Highest education level: Not on file  Occupational History   Occupation: retired  Tobacco Use   Smoking status: Some Days    Packs/day: 0.50    Years: 33.00    Total pack years: 16.50    Types: Cigarettes    Last attempt to quit: 2013    Years since quitting: 11.1   Smokeless tobacco: Never  Vaping Use   Vaping Use: Never used  Substance and Sexual Activity   Alcohol use: No    Alcohol/week: 0.0 standard drinks of alcohol   Drug use: No   Sexual activity: Not Currently  Other Topics Concern   Not on file  Social History Narrative   Not on file   Social Determinants of Health   Financial Resource Strain: Not on file  Food Insecurity: Not on file  Transportation Needs: Not on file  Physical Activity: Not on file  Stress: Not on file  Social Connections: Not on file  Intimate Partner Violence: Not on file    FAMILY HISTORY:  Family History  Problem Relation Age of Onset   Colon cancer Other    Bone cancer Mother    Brain cancer Father    Brain cancer Sister    Breast cancer Sister    Renal Disease Sister    Pancreatic cancer Neg Hx    Esophageal cancer Neg Hx    Stomach cancer Neg Hx    Rectal cancer Neg Hx     CURRENT MEDICATIONS:  Outpatient Encounter Medications as of 11/29/2022  Medication Sig Note   acetaminophen (TYLENOL) 500 MG tablet Take 500 mg by mouth every 6 (six) hours as needed.    alendronate (FOSAMAX) 70 MG tablet     ASPIRIN 81 PO Take 1 tablet by mouth daily.    atorvastatin (LIPITOR) 20 MG tablet Take 1 tablet (20 mg total) by mouth daily.    BD SYRINGE SLIP TIP 25G X 5/8" 1 ML MISC USE AS DIRECTED FOR ONCE MONTHLY B12 INJECTIONS    Cholecalciferol (VITAMIN D3) 25 MCG (1000 UT) CAPS 1 capsule Orally Once a day for 30 day(s)    cyanocobalamin (,VITAMIN B-12,) 1000 MCG/ML  injection Inject 1,000 mcg into the skin every 30 (thirty) days.    cyclobenzaprine (FLEXERIL) 5 MG tablet Take 1 tablet (5 mg total) by mouth at bedtime as needed for muscle spasms.    dicyclomine (BENTYL) 10 MG capsule Take 1 capsule (10 mg total) by mouth 4 (four)  times daily as needed for spasms.    fentaNYL (DURAGESIC) 25 MCG/HR Place 1 patch onto the skin every 3 (three) days. 08/07/2022: 08/07/22 50 mcg   fentaNYL (DURAGESIC) 50 MCG/HR Place 1 patch onto the skin every 3 (three) days.    FORTEO 600 MCG/2.4ML SOPN ADMINISTER 0.08 ML UNDER THE SKIN DAILY    furosemide (LASIX) 20 MG tablet Take 20 mg by mouth daily as needed.    Heat Wraps (THERMACARE BACK/HIP) MISC 1 application by Does not apply route daily.    HYDROcodone-acetaminophen (NORCO/VICODIN) 5-325 MG tablet 1 tablet as needed Orally every 6 hrs    hydroxychloroquine (PLAQUENIL) 200 MG tablet 1 tablet with food or milk Orally every other day for 30 days    hydrOXYzine (ATARAX) 25 MG tablet Take 25 mg by mouth 2 (two) times daily as needed for itching.    hydrOXYzine (ATARAX) 50 MG tablet Take by mouth.    lidocaine (LIDODERM) 5 % one patch daily.    lipase/protease/amylase (CREON) 12000-38000 units CPEP capsule Use 1 capsule with meals and snacks    LORazepam (ATIVAN) 0.5 MG tablet TAKE 1/2 TO 1 TABLET BY MOUTH AT BEDTIME AS NEEDED    megestrol (MEGACE) 40 MG tablet Take 1 tablet (40 mg total) by mouth daily.    metoprolol tartrate (LOPRESSOR) 25 MG tablet     naloxone (NARCAN) nasal spray 4 mg/0.1 mL SMARTSIG:Both Nares    naratriptan (AMERGE) 2.5 MG tablet Take 1 tablet (2.5 mg total) by mouth as needed for migraine. Take one (1) tablet at onset of headache; if returns or does not resolve, may repeat after 4 hours; do not exceed five (5) mg in 24 hours.    ondansetron (ZOFRAN-ODT) 4 MG disintegrating tablet Take 4 mg by mouth every 8 (eight) hours as needed.    pantoprazole (PROTONIX) 40 MG tablet TAKE 1 TABLET(40 MG) BY MOUTH  TWICE DAILY    PARoxetine (PAXIL) 10 MG tablet TAKE 1 TABLET(10 MG) BY MOUTH DAILY (Patient taking differently: Take 20 mg by mouth daily. TAKE 1 TABLET(10 MG) BY MOUTH DAILY)    predniSONE (DELTASONE) 5 MG tablet Take 1 tablet (5 mg total) by mouth daily with breakfast. 08/07/2022: As needed   pregabalin (LYRICA) 25 MG capsule Take 1 capsule (25 mg total) by mouth 2 (two) times daily.    promethazine (PHENERGAN) 25 MG tablet Take 0.5-1 tablets (12.5-25 mg total) by mouth every 6 (six) hours as needed for nausea or vomiting. (Patient not taking: Reported on 06/28/2022)    Syringe/Needle, Disp, (SYRINGE 3CC/23GX1") 23G X 1" 3 ML MISC by Does not apply route.    Teriparatide, Recombinant, (FORTEO) 600 MCG/2.4ML SOPN INJECT 20 MCG UNDER THE SKIN (SUBCUTANEOUS INJECTION) DAILY    traMADol (ULTRAM) 50 MG tablet Take 1 tablet (50 mg total) by mouth every 12 (twelve) hours as needed.    traZODone (DESYREL) 100 MG tablet Take 1 tablet (100 mg total) by mouth at bedtime as needed for sleep.    No facility-administered encounter medications on file as of 11/29/2022.    ALLERGIES:  Allergies  Allergen Reactions   Abatacept Other (See Comments)   Codeine Other (See Comments)    Stomach pains/constipation- due to IBS   Golimumab Other (See Comments)   Hydrocodone Other (See Comments)    She does not want to take this due to intolerance to codeine.   Methylprednisolone Diarrhea and Other (See Comments)    "Made my IBS flare up"  Diarrhea, nausea  Methotrexate Derivatives Other (See Comments)    Agitation; pt stated, "I get no sleep; one dose caused no sleep for 7 days and 7 nights - that was when I got a 0.5 injection"   Prednisone Rash    Cannot take prednisone by mouth ---- red rash all over    LABORATORY DATA:  I have reviewed the labs as listed.  CBC    Component Value Date/Time   WBC 14.2 (H) 10/03/2022 1451   RBC 3.94 10/03/2022 1451   HGB 11.5 (L) 10/03/2022 1451   HGB 11.5 10/07/2020  1321   HCT 34.3 (L) 10/03/2022 1451   HCT 34.0 10/07/2020 1321   PLT 362 10/03/2022 1451   PLT 462 (H) 10/07/2020 1321   MCV 87.1 10/03/2022 1451   MCV 91 10/07/2020 1321   MCH 29.2 10/03/2022 1451   MCHC 33.5 10/03/2022 1451   RDW 13.1 10/03/2022 1451   RDW 13.7 10/07/2020 1321   LYMPHSABS 1,633 10/03/2022 1451   LYMPHSABS 2.9 10/07/2020 1321   MONOABS 0.8 01/05/2022 1335   EOSABS 156 10/03/2022 1451   EOSABS 0.1 10/07/2020 1321   BASOSABS 85 10/03/2022 1451   BASOSABS 0.1 10/07/2020 1321      Latest Ref Rng & Units 10/03/2022    2:51 PM 05/18/2022   10:25 AM 02/07/2022    1:39 PM  CMP  Glucose 65 - 99 mg/dL 93  66  62   BUN 7 - 25 mg/dL '10  10  15   '$ Creatinine 0.50 - 1.05 mg/dL 0.69  0.78  0.67   Sodium 135 - 146 mmol/L 140  137  137   Potassium 3.5 - 5.3 mmol/L 4.3  3.6  3.6   Chloride 98 - 110 mmol/L 102  104  102   CO2 20 - 32 mmol/L '29  26  29   '$ Calcium 8.6 - 10.4 mg/dL 10.2  9.7  9.8   Total Protein 6.1 - 8.1 g/dL 7.2  6.5  6.3   Total Bilirubin 0.2 - 1.2 mg/dL 0.2  0.5  0.3   AST 10 - 35 U/L '16  15  13   '$ ALT 6 - 29 U/L '10  8  6     '$ DIAGNOSTIC IMAGING:  I have independently reviewed the relevant imaging and discussed with the patient.   WRAP UP:  All questions were answered. The patient knows to call the clinic with any problems, questions or concerns.  Medical decision making: ***  Time spent on visit: I spent *** minutes counseling the patient face to face. The total time spent in the appointment was *** minutes and more than 50% was on counseling.  Harriett Rush, PA-C  ***

## 2022-11-29 ENCOUNTER — Other Ambulatory Visit: Payer: Self-pay

## 2022-11-29 ENCOUNTER — Inpatient Hospital Stay: Payer: Medicare HMO | Attending: Physician Assistant | Admitting: Physician Assistant

## 2022-11-29 VITALS — BP 87/53 | HR 108 | Temp 97.8°F | Resp 18 | Ht <= 58 in | Wt 73.9 lb

## 2022-11-29 DIAGNOSIS — D72829 Elevated white blood cell count, unspecified: Secondary | ICD-10-CM | POA: Insufficient documentation

## 2022-11-29 DIAGNOSIS — M810 Age-related osteoporosis without current pathological fracture: Secondary | ICD-10-CM | POA: Diagnosis not present

## 2022-11-29 DIAGNOSIS — Z7952 Long term (current) use of systemic steroids: Secondary | ICD-10-CM | POA: Diagnosis not present

## 2022-11-29 DIAGNOSIS — D75838 Other thrombocytosis: Secondary | ICD-10-CM | POA: Diagnosis not present

## 2022-11-29 DIAGNOSIS — R5381 Other malaise: Secondary | ICD-10-CM | POA: Insufficient documentation

## 2022-11-29 DIAGNOSIS — D509 Iron deficiency anemia, unspecified: Secondary | ICD-10-CM | POA: Insufficient documentation

## 2022-11-29 DIAGNOSIS — M069 Rheumatoid arthritis, unspecified: Secondary | ICD-10-CM | POA: Diagnosis not present

## 2022-11-29 DIAGNOSIS — R591 Generalized enlarged lymph nodes: Secondary | ICD-10-CM | POA: Insufficient documentation

## 2022-11-29 DIAGNOSIS — Z7982 Long term (current) use of aspirin: Secondary | ICD-10-CM | POA: Diagnosis not present

## 2022-11-29 DIAGNOSIS — Z79899 Other long term (current) drug therapy: Secondary | ICD-10-CM | POA: Diagnosis not present

## 2022-11-29 DIAGNOSIS — F17210 Nicotine dependence, cigarettes, uncomplicated: Secondary | ICD-10-CM | POA: Insufficient documentation

## 2022-11-29 DIAGNOSIS — E46 Unspecified protein-calorie malnutrition: Secondary | ICD-10-CM | POA: Diagnosis not present

## 2022-11-29 NOTE — Patient Instructions (Signed)
[place] at [facility] Discharge Instructions  You were seen today by Tarri Abernethy PA-C for your iron deficiency.    TREATMENT: IV iron x 3 doses  LABS: Return in 2 months for repeat labs  OTHER TESTS: Continue follow-up with other specialists  FOLLOW-UP APPOINTMENT: Office visit in 2 months, after labs   Thank you for choosing [place] at [facility] to provide your oncology and hematology care.  To afford each patient quality time with our provider, please arrive at least 15 minutes before your scheduled appointment time.   If you have a lab appointment with the [facility] please come in thru the Main Entrance and check in at the main information desk.  You need to re-schedule your appointment should you arrive 10 or more minutes late.  We strive to give you quality time with our providers, and arriving late affects you and other patients whose appointments are after yours.  Also, if you no show three or more times for appointments you may be dismissed from the clinic at the providers discretion.     Again, thank you for choosing [facility].  Our hope is that these requests will decrease the amount of time that you wait before being seen by our physicians.       _____________________________________________________________  Should you have questions after your visit to [facility], please contact our office at [phone] and follow the prompts.  Our office hours are 8:00 a.m. and 4:30 p.m. Monday - Friday.  Please note that voicemails left after 4:00 p.m. may not be returned until the following business day.  We are closed weekends and major holidays.  You do have access to a nurse 24-7, just call the main number to the clinic [phone] and do not press any options, hold on the line and a nurse will answer the phone.    For prescription refill requests, have your pharmacy contact our office and  allow 72 hours.    Due to Covid, you will need to wear a mask upon entering the hospital. If you do not have a mask, a mask will be given to you at the Main Entrance upon arrival. For doctor visits, patients may have 1 support person age 24 or older with them. For treatment visits, patients can not have anyone with them due to social distancing guidelines and our immunocompromised population.

## 2022-11-30 NOTE — Telephone Encounter (Signed)
Error

## 2022-12-01 ENCOUNTER — Ambulatory Visit: Payer: Medicare HMO | Admitting: Internal Medicine

## 2022-12-04 ENCOUNTER — Inpatient Hospital Stay: Payer: Medicare HMO | Attending: Hematology

## 2022-12-04 VITALS — BP 99/68 | HR 94 | Temp 98.8°F | Resp 18

## 2022-12-04 DIAGNOSIS — D509 Iron deficiency anemia, unspecified: Secondary | ICD-10-CM | POA: Diagnosis present

## 2022-12-04 DIAGNOSIS — R591 Generalized enlarged lymph nodes: Secondary | ICD-10-CM | POA: Insufficient documentation

## 2022-12-04 MED ORDER — SODIUM CHLORIDE 0.9 % IV SOLN: Freq: Once | INTRAVENOUS | Status: AC

## 2022-12-04 MED ORDER — SODIUM CHLORIDE 0.9 % IV SOLN
300.0000 mg | Freq: Once | INTRAVENOUS | Status: AC
  Administered 2022-12-04: 300 mg via INTRAVENOUS
  Filled 2022-12-04: qty 300

## 2022-12-04 NOTE — Progress Notes (Signed)
Pt presents today for Venofer IV iron infusion per provider's order. Pt's blood pressure was 79/47 at arrival, Rebekah Pennington-PA made aware and stated to give 500 mL of normal saline over 1 hour.  Pt took pre-meds at home prior to arrival. Peripheral IV started with good blood return pre and post infusion.  Venofer 300 mg and 500 mL of normal saline over 1 hour given today per MD orders. Tolerated infusion without adverse affects. Vital signs stable. No complaints at this time. Discharged from clinic via wheelchair in stable condition. Alert and oriented x 3. F/U with [facility] as scheduled.

## 2022-12-04 NOTE — Patient Instructions (Signed)
[facility]  Discharge Instructions: Thank you for choosing [facility] to provide your oncology and hematology care.  If you have a lab appointment with the [facility], please come in thru the Main Entrance and check in at the main information desk.  Wear comfortable clothing and clothing appropriate for easy access to any Portacath or PICC line.   We strive to give you quality time with your provider. You may need to reschedule your appointment if you arrive late (15 or more minutes).  Arriving late affects you and other patients whose appointments are after yours.  Also, if you miss three or more appointments without notifying the office, you may be dismissed from the clinic at the provider's discretion.      For prescription refill requests, have your pharmacy contact our office and allow 72 hours for refills to be completed.    Today you received Venofer IV iron infusion.     BELOW ARE SYMPTOMS THAT SHOULD BE REPORTED IMMEDIATELY: *FEVER GREATER THAN 100.4 F (38 C) OR HIGHER *CHILLS OR SWEATING *NAUSEA AND VOMITING THAT IS NOT CONTROLLED WITH YOUR NAUSEA MEDICATION *UNUSUAL SHORTNESS OF BREATH *UNUSUAL BRUISING OR BLEEDING *URINARY PROBLEMS (pain or burning when urinating, or frequent urination) *BOWEL PROBLEMS (unusual diarrhea, constipation, pain near the anus) TENDERNESS IN MOUTH AND THROAT WITH OR WITHOUT PRESENCE OF ULCERS (sore throat, sores in mouth, or a toothache) UNUSUAL RASH, SWELLING OR PAIN  UNUSUAL VAGINAL DISCHARGE OR ITCHING   Items with * indicate a potential emergency and should be followed up as soon as possible or go to the Emergency Department if any problems should occur.  Please show the CHEMOTHERAPY ALERT CARD or IMMUNOTHERAPY ALERT CARD at check-in to the Emergency Department and triage nurse.  Should you have questions after your visit or need to cancel or reschedule your appointment, please contact [facility] [phone]  and follow the prompts.  Office hours are 8:00 a.m. to 4:30 p.m. Monday - Friday. Please note that voicemails left after 4:00 p.m. may not be returned until the following business day.  We are closed weekends and major holidays. You have access to a nurse at all times for urgent questions. Please call the main number to the clinic [phone] and follow the prompts.  For any non-urgent questions, you may also contact your provider using MyChart. We now offer e-Visits for anyone 15 and older to request care online for non-urgent symptoms. For details visit [email or web address].   Also download the MyChart app! Go to the app store, search "MyChart", open the app, select [facility] [place] [facility], and log in with your MyChart username and password.

## 2022-12-06 ENCOUNTER — Other Ambulatory Visit (HOSPITAL_COMMUNITY): Payer: Medicare HMO

## 2022-12-11 ENCOUNTER — Inpatient Hospital Stay: Payer: Medicare HMO

## 2022-12-11 VITALS — BP 98/51 | HR 92 | Temp 98.5°F | Resp 18

## 2022-12-11 DIAGNOSIS — D509 Iron deficiency anemia, unspecified: Secondary | ICD-10-CM

## 2022-12-11 DIAGNOSIS — R031 Nonspecific low blood-pressure reading: Secondary | ICD-10-CM

## 2022-12-11 MED ORDER — SODIUM CHLORIDE 0.9 % IV SOLN: INTRAVENOUS | Status: DC

## 2022-12-11 MED ORDER — SODIUM CHLORIDE 0.9 % IV SOLN: Freq: Once | INTRAVENOUS | Status: AC

## 2022-12-11 MED ORDER — SODIUM CHLORIDE 0.9 % IV SOLN
300.0000 mg | Freq: Once | INTRAVENOUS | Status: AC
  Administered 2022-12-11: 300 mg via INTRAVENOUS
  Filled 2022-12-11: qty 300

## 2022-12-11 NOTE — Patient Instructions (Signed)
[facility]  Discharge Instructions: Thank you for choosing [facility] to provide your oncology and hematology care.  If you have a lab appointment with the [facility], please come in thru the Main Entrance and check in at the main information desk.  Wear comfortable clothing and clothing appropriate for easy access to any Portacath or PICC line.   We strive to give you quality time with your provider. You may need to reschedule your appointment if you arrive late (15 or more minutes).  Arriving late affects you and other patients whose appointments are after yours.  Also, if you miss three or more appointments without notifying the office, you may be dismissed from the clinic at the provider's discretion.      For prescription refill requests, have your pharmacy contact our office and allow 72 hours for refills to be completed.    Today you received the following Venofer, Return as scheduled.   To help prevent nausea and vomiting after your treatment, we encourage you to take your nausea medication as directed.  BELOW ARE SYMPTOMS THAT SHOULD BE REPORTED IMMEDIATELY: *FEVER GREATER THAN 100.4 F (38 C) OR HIGHER *CHILLS OR SWEATING *NAUSEA AND VOMITING THAT IS NOT CONTROLLED WITH YOUR NAUSEA MEDICATION *UNUSUAL SHORTNESS OF BREATH *UNUSUAL BRUISING OR BLEEDING *URINARY PROBLEMS (pain or burning when urinating, or frequent urination) *BOWEL PROBLEMS (unusual diarrhea, constipation, pain near the anus) TENDERNESS IN MOUTH AND THROAT WITH OR WITHOUT PRESENCE OF ULCERS (sore throat, sores in mouth, or a toothache) UNUSUAL RASH, SWELLING OR PAIN  UNUSUAL VAGINAL DISCHARGE OR ITCHING   Items with * indicate a potential emergency and should be followed up as soon as possible or go to the Emergency Department if any problems should occur.  Please show the CHEMOTHERAPY ALERT CARD or IMMUNOTHERAPY ALERT CARD at check-in to the Emergency Department and triage  nurse.  Should you have questions after your visit or need to cancel or reschedule your appointment, please contact [facility] [phone]  and follow the prompts.  Office hours are 8:00 a.m. to 4:30 p.m. Monday - Friday. Please note that voicemails left after 4:00 p.m. may not be returned until the following business day.  We are closed weekends and major holidays. You have access to a nurse at all times for urgent questions. Please call the main number to the clinic [phone] and follow the prompts.  For any non-urgent questions, you may also contact your provider using MyChart. We now offer e-Visits for anyone 40 and older to request care online for non-urgent symptoms. For details visit [email or web address].   Also download the MyChart app! Go to the app store, search "MyChart", open the app, select [facility], and log in with your MyChart username and password.

## 2022-12-11 NOTE — Progress Notes (Signed)
Patient took Benadryl and tylenol at home at 1220.    Patient stated she is dizzy and see "spots" when she stands.  Reviewed vital signs with verbal order Normal Saline 500 ml bolus over one hour with iron infusion.

## 2022-12-11 NOTE — Progress Notes (Signed)
Patient tolerated iron infusion with no complaints voiced.  Peripheral IV site clean and dry with good blood return noted before and after infusion.  Band aid applied.  VSS with discharge and left in satisfactory condition with no s/s of distress noted.   

## 2022-12-19 ENCOUNTER — Other Ambulatory Visit (HOSPITAL_COMMUNITY): Payer: Self-pay

## 2022-12-19 ENCOUNTER — Inpatient Hospital Stay: Payer: Medicare HMO

## 2022-12-19 VITALS — BP 84/39 | HR 77 | Temp 97.9°F | Resp 16

## 2022-12-19 DIAGNOSIS — D509 Iron deficiency anemia, unspecified: Secondary | ICD-10-CM | POA: Diagnosis not present

## 2022-12-19 MED ORDER — SODIUM CHLORIDE 0.9 % IV SOLN: Freq: Once | INTRAVENOUS | Status: AC

## 2022-12-19 MED ORDER — SODIUM CHLORIDE 0.9 % IV SOLN
300.0000 mg | Freq: Once | INTRAVENOUS | Status: AC
  Administered 2022-12-19: 300 mg via INTRAVENOUS
  Filled 2022-12-19: qty 300

## 2022-12-19 NOTE — Progress Notes (Signed)
Patient presents today for Venofer infusion per providers order.  Vital signs WNL.  Patient has no new complaints at this time.    Peripheral IV started and blood return noted pre and post infusion.  Patients BP on completion of Venofer was 86/50, MD notified.  Patient to receive 500 cc bolus over one hour per [facility] order.

## 2022-12-19 NOTE — Patient Instructions (Signed)
[facility]  Discharge Instructions: Thank you for choosing [facility] to provide your oncology and hematology care.  If you have a lab appointment with the Cancer Center, please come in thru the Main Entrance and check in at the main information desk.  Wear comfortable clothing and clothing appropriate for easy access to any Portacath or PICC line.   We strive to give you quality time with your provider. You may need to reschedule your appointment if you arrive late (15 or more minutes).  Arriving late affects you and other patients whose appointments are after yours.  Also, if you miss three or more appointments without notifying the office, you may be dismissed from the clinic at the provider's discretion.      For prescription refill requests, have your pharmacy contact our office and allow 72 hours for refills to be completed.    Today you received the following chemotherapy and/or immunotherapy agents Venofer      To help prevent nausea and vomiting after your treatment, we encourage you to take your nausea medication as directed.  BELOW ARE SYMPTOMS THAT SHOULD BE REPORTED IMMEDIATELY: *FEVER GREATER THAN 100.4 F (38 C) OR HIGHER *CHILLS OR SWEATING *NAUSEA AND VOMITING THAT IS NOT CONTROLLED WITH YOUR NAUSEA MEDICATION *UNUSUAL SHORTNESS OF BREATH *UNUSUAL BRUISING OR BLEEDING *URINARY PROBLEMS (pain or burning when urinating, or frequent urination) *BOWEL PROBLEMS (unusual diarrhea, constipation, pain near the anus) TENDERNESS IN MOUTH AND THROAT WITH OR WITHOUT PRESENCE OF ULCERS (sore throat, sores in mouth, or a toothache) UNUSUAL RASH, SWELLING OR PAIN  UNUSUAL VAGINAL DISCHARGE OR ITCHING   Items with * indicate a potential emergency and should be followed up as soon as possible or go to the Emergency Department if any problems should occur.  Please show the CHEMOTHERAPY ALERT CARD or IMMUNOTHERAPY ALERT CARD at check-in to the Emergency  Department and triage nurse.  Should you have questions after your visit or need to cancel or reschedule your appointment, please contact [facility] [phone]  and follow the prompts.  Office hours are 8:00 a.m. to 4:30 p.m. Monday - Friday. Please note that voicemails left after 4:00 p.m. may not be returned until the following business day.  We are closed weekends and major holidays. You have access to a nurse at all times for urgent questions. Please call the main number to the clinic [phone] and follow the prompts.  For any non-urgent questions, you may also contact your provider using MyChart. We now offer e-Visits for anyone 18 and older to request care online for non-urgent symptoms. For details visit [email or web address].   Also download the MyChart app! Go to the app store, search "MyChart", open the app, select [facility], and log in with your MyChart username and password.   

## 2022-12-19 NOTE — Progress Notes (Signed)
Reported blood pressure of 84/39 after 500 ml bolus of normal saline. Patient asymptomatic. Patient states she has had low blood pressure since her iron studies have been low. Verbal orders received may discharge patient home per Dr. Delton Coombes. Patient's blood pressures trend low and that is her normal.   Feraheme given today per MD orders. Tolerated infusion without adverse affects. Vital signs stable. No complaints at this time. Discharged from clinic by wheel chair in stable condition. Alert and oriented x 3. F/U with [facility] as scheduled.

## 2022-12-21 ENCOUNTER — Other Ambulatory Visit (HOSPITAL_COMMUNITY): Payer: Self-pay

## 2022-12-25 ENCOUNTER — Other Ambulatory Visit (HOSPITAL_COMMUNITY): Payer: Self-pay

## 2023-01-16 ENCOUNTER — Other Ambulatory Visit (HOSPITAL_COMMUNITY): Payer: Self-pay

## 2023-01-16 ENCOUNTER — Other Ambulatory Visit: Payer: Self-pay | Admitting: Gastroenterology

## 2023-01-16 ENCOUNTER — Encounter: Payer: Self-pay | Admitting: Internal Medicine

## 2023-01-16 ENCOUNTER — Ambulatory Visit: Payer: Medicare HMO | Attending: Internal Medicine | Admitting: Internal Medicine

## 2023-01-16 VITALS — BP 87/53 | HR 103 | Resp 14 | Ht <= 58 in | Wt 73.0 lb

## 2023-01-16 DIAGNOSIS — Z79899 Other long term (current) drug therapy: Secondary | ICD-10-CM | POA: Insufficient documentation

## 2023-01-16 DIAGNOSIS — D509 Iron deficiency anemia, unspecified: Secondary | ICD-10-CM | POA: Diagnosis not present

## 2023-01-16 DIAGNOSIS — K582 Mixed irritable bowel syndrome: Secondary | ICD-10-CM | POA: Diagnosis not present

## 2023-01-16 DIAGNOSIS — M0579 Rheumatoid arthritis with rheumatoid factor of multiple sites without organ or systems involvement: Secondary | ICD-10-CM

## 2023-01-16 DIAGNOSIS — R109 Unspecified abdominal pain: Secondary | ICD-10-CM

## 2023-01-16 MED ORDER — SULFASALAZINE 500 MG PO TABS
1000.0000 mg | ORAL_TABLET | Freq: Two times a day (BID) | ORAL | 1 refills | Status: DC
  Filled 2023-01-16: qty 120, 30d supply, fill #0
  Filled 2023-01-18: qty 120, 30d supply, fill #1
  Filled 2023-01-18: qty 120, 30d supply, fill #0

## 2023-01-16 NOTE — Progress Notes (Unsigned)
Office Visit Note  Patient: Vanessa Lara             Date of Birth: [birth year]           MRN: [id number]             PCP: [name], NP Referring: [name], NP Visit Date: 01/16/2023   Subjective:  Follow-up (Patient states she is in a lot of pain.)   History of Present Illness: Vanessa Lara is a 67 y.o. female here for follow up for seropositive RA.  Currently health maintenance treatment after discontinuing 5 mg prednisone is on medication for joint pain for RA along with osteoarthritis and chronic pain syndrome.  Had recent knee steroid injection since her last visit but reports overall widespread symptoms are severe.  Particularly is been noticing some increased trouble with pain and some swelling involving the wrist worse on left side.  Previous HPI 10/03/22 Vanessa Lara is a 67 y.o. female here for follow up for seropositive RA currently off long term DMARD with prednisone 5 mg daily as needed.  She is overall not doing well feels like pain and stiffness is a big problem but more severe is her fatigue and feels a generalized getting weaker with difficulty moving and with her upper body strength.  She pretty consistently has to take the prednisone for otherwise does not move around much during the day.  Continues to get a benefit with the Flexeril at nighttime with slight decrease in frequency of awakenings.  She is also concerned about getting an update to her bone density testing ongoing oral alendronate treatment.  She is also still having a lot of pain and difficulty with walking from her claw toe deformities on the foot and planning to follow-up with surgery clinic about management of this.   Previous HPI 06/28/22 Vanessa Lara is a 67 y.o. female here for follow up for seropositive RA now off of treatment after stopping the [place].  Since she stopped taking the medication her complaint of night sweats and fatigue have improved. She has seen a worsening of joint  pain in several areas particularly around the MCP joints of both hands. For knee pain she saw orthopedic surgery clinic yesterday and had injections on both sides.  She also discussed getting set up to do right SI joint steroid injection.  Biggest complaint today is back pain on the right side around the middle of the back and around the scapula.  This bothers her especially with certain movements and trying to bend side to side.    Previous HPI 05/18/2022  Vanessa Lara is a 67 y.o. female here for follow up for seropositive rheumatoid arthritis on Kevzara 150 mg [place] q14days. She feels arthritis has been fairly well controlled but has pain worse in her left foot mostly. She had GI issues suspected UTI but with negative culture since last visit. She feels a sensation "like her brain is numb." Also has ongoing upper neck pain without radiating symptoms.    Previous HPI 02/07/2022 Vanessa Lara is a 67 y.o. female here for follow up for rheumatoid arthritis with chronic joint pain in multiple areas and chronic fatigue labs showing elevated rheumatoid factor. Kevzara new start on 12/15/2021. She felt an improvement when taking the injections within first weeks. Accidentally took the shots twice in a week due to forgetfulness or mistake but did not notice any major problems. She called in on account of increased joint pain  but felt no improvement with the prescribed medrol dose pack. She did feel more lightheadedness or off balance feeling while taking this so stopped after about 3 days. Currently back pain and her left foot pain are worst issues.   Previous HPI 12/05/2021 Vanessa Lara is a 67 y.o. female here for evaluation of rheumatoid arthritis with chronic joint pain in multiple areas and chronic fatigue labs showing elevated rheumatoid factor. She has history of mitral valve prolapse, severe osteoporosis, osteoarthritis, and severe weight loss and deconditioning. She was diagnosed with seropositive RA in  2018 with Dr. Deanne Coffer and most recently saw Dr. Dierdre Forth in 11/2020. At that time recommended to start hydroxychloroquine for inflammatory arthritis symptoms. She previously tried methotrexate, simponi aria, and orencia with poor tolerance. Imaging consistent with degenerative arthritis in cervical spine and knee but also with some partial tendon tear in shoulder and some synovitis present in knee on MRI imaging. She has taken multiple medications for pain including NSAIDs, prednisone, gabapentin, tramadol, and hydrocodone or oxycodone. She has a lot of pain in multiple areas but is also severely fatigued. She has significant weight loss down to 70 lbs unintentionally reports chronic IBS issues but no recent changes during this period of weight loss. Joint pain also limits her activity quite a bit with chronic deformities and also ongoing swelling in bilateral hands. She has also developed left sided headache localized above the temporal area. She has a lot of pain at the base of the skull and occiput without clear radiation of symptoms. She is scheduled to see Dr. Delena Bali for headache evaluation and management 3/28.   DMARD Hx MTX Simponi Orencia Kevzara     05/2017 RF 15 CCP 32 HBV neg HCV neg   Review of Systems  Constitutional:  Positive for fatigue.  HENT:  Negative for mouth sores and mouth dryness.   Eyes:  Negative for dryness.  Respiratory:  Negative for shortness of breath.   Cardiovascular:  Positive for palpitations. Negative for chest pain.  Gastrointestinal:  Positive for constipation and diarrhea. Negative for blood in stool.  Endocrine: Negative for increased urination.  Genitourinary:  Positive for involuntary urination.  Musculoskeletal:  Positive for joint pain, gait problem, joint pain, joint swelling, myalgias, muscle weakness, morning stiffness, muscle tenderness and myalgias.  Skin:  Positive for sensitivity to sunlight. Negative for color change, rash and hair loss.   Allergic/Immunologic: Negative for susceptible to infections.  Neurological:  Positive for dizziness. Negative for headaches.  Hematological:  Negative for swollen glands.  Psychiatric/Behavioral:  Positive for sleep disturbance. Negative for depressed mood. The patient is nervous/anxious.     PMFS History:  Patient Active Problem List   Diagnosis Date Noted   High risk medication use 01/16/2023   Medication monitoring encounter 12/05/2021   Physical deconditioning 11/11/2021   Elevated serum lactate dehydrogenase (LDH) 08/17/2021   Vitamin D deficiency 08/17/2021   Chronic fatigue 07/25/2021   Vitamin B12 deficiency 07/19/2021   Hardening of the aorta (main artery of the heart) 06/21/2021   Solitary lung nodule 06/15/2021   Loss of weight 05/18/2021   Protein-calorie malnutrition, severe 05/18/2021   Osteoporosis 06/15/2020   Iron deficiency anemia 04/09/2020   Primary osteoarthritis involving multiple joints 12/31/2018   Rheumatoid arthritis involving multiple sites with positive rheumatoid factor 12/31/2018   Irritable bowel syndrome with both constipation and diarrhea 10/28/2018   Constipation 03/14/2016   Mitral valve prolapse 11/23/2015   High cholesterol 11/23/2015    Past Medical History:  Diagnosis  Date   Anemia    Anxiety    Arthritis    RA   Cancer    Skin   CHF (congestive heart failure)    Collagen vascular disease    Depression    Endometriosis    High cholesterol    IBS (irritable bowel syndrome)    Lymphadenopathy    Mitral valve prolapse    RA (rheumatoid arthritis)    Rotator cuff tear    bilateral   Stroke 06/2022   Uterus, adenomyosis     Family History  Problem Relation Age of Onset   Colon cancer Other    Bone cancer Mother    Brain cancer Father    Brain cancer Sister    Breast cancer Sister    Renal Disease Sister    Pancreatic cancer Neg Hx    Esophageal cancer Neg Hx    Stomach cancer Neg Hx    Rectal cancer Neg Hx    Past  Surgical History:  Procedure Laterality Date   AXILLARY LYMPH NODE BIOPSY Right 10/12/2020   Procedure: RIGHT AXILLARY LYMPH NODE BIOPSY EXCISION;  Surgeon: Almond Lint, MD;  Location: [facility];  Service: General;  Laterality: Right;   BUNIONECTOMY Right    CARPAL TUNNEL RELEASE Right    COLONOSCOPY     COLONOSCOPY N/A 12/13/2015   Procedure: COLONOSCOPY;  Surgeon: Malissa Hippo, MD;  [facility];  Service: Endoscopy;  Laterality: N/A;  9:55   Fatty tumor     2011 (left arm)   FOOT SURGERY     hammertoe   TONSILLECTOMY     TOTAL ABDOMINAL HYSTERECTOMY     2003   Social History   Social History Narrative   Not on file   Immunization History  Administered Date(s) Administered   Influenza, Quadrivalent, Recombinant, Inj, Pf 07/11/2017     Objective: Vital Signs: BP (!) 87/53 (BP Location: Left Arm, Patient Position: Sitting, Cuff Size: Normal)   Pulse (!) 103   Resp 14   Ht 4\' 9"  (1.448 m)   Wt 73 lb (33.1 kg)   BMI 15.80 kg/m    Physical Exam Eyes:     Conjunctiva/sclera: Conjunctivae normal.  Cardiovascular:     Rate and Rhythm: Normal rate and regular rhythm.  Pulmonary:     Effort: Pulmonary effort is normal.     Breath sounds: Normal breath sounds.  Skin:    General: Skin is warm and dry.  Neurological:     Mental Status: She is alert.      Musculoskeletal Exam:  Shoulders some right-sided limited abduction range of motion, bilateral tenderness to pressure without effusions Elbows full ROM no tenderness or swelling Prominent left ulnar styloid process with tenderness to pressure but no wrist synovitis Mild ulnar deviation and subluxation of MCP joint in bilateral hands that is fully reducible, tenderness to pressure without palpable synovitis No paraspinal tenderness to palpation over upper and lower back Hip normal internal and external rotation without pain, no tenderness to lateral hip palpation Knee tenderness to  pressureBilaterally no palpable effusion Cocked up toe deformities from the second through fourth MTP joints, tenderness to pressure no palpable synovitis   Investigation: No additional findings.  Imaging: No results found.  Recent Labs: Lab Results  Component Value Date   WBC 14.2 (H) 10/03/2022   HGB 11.5 (L) 10/03/2022   PLT 362 10/03/2022   NA 140 10/03/2022   K 4.3 10/03/2022   CL 102 10/03/2022  CO2 29 10/03/2022   GLUCOSE 93 10/03/2022   BUN 10 10/03/2022   CREATININE 0.69 10/03/2022   BILITOT 0.2 10/03/2022   ALKPHOS 148 (H) 08/23/2020   AST 16 10/03/2022   ALT 10 10/03/2022   PROT 7.2 10/03/2022   ALBUMIN 3.6 08/23/2020   CALCIUM 10.2 10/03/2022   GFRAA 92 11/06/2019   QFTBGOLDPLUS NEGATIVE 12/05/2021    Speciality Comments: No specialty comments available.  Procedures:  No procedures performed Allergies: Abatacept, Codeine, Golimumab, Hydrocodone, Methylprednisolone, Methotrexate derivatives, and Prednisone   Assessment / Plan:     Visit Diagnoses: Rheumatoid arthritis involving multiple sites with positive rheumatoid factor - Plan: sulfaSALAzine (AZULFIDINE) 500 MG tablet  Seropositive RA unfortunately with several medication lack of efficacy or intolerance.  Has a lot of chronic pain in severity relative to the amount of objective inflammation present on exam.  Rechecking sedimentation rate for disease activity monitoring.  Previously discussed possible use of Jak inhibitor but recommend against with stroke and cardiovascular disease history.  Also with increased infections on previous biologic DMARD.  Plan to start sulfasalazine initially 500 mg and titrate up to 1000 mg twice daily or as tolerated.  Irritable bowel syndrome with both constipation and diarrhea High risk medication use  Discussed risks of medication including cytopenias renal hepatic function on sulfasalazine.  Checking CBC and CMP baseline for medication monitoring.  Suspect most common  limiting factor for GI intolerance with history of irritable bowel symptoms.  Orders: No orders of the defined types were placed in this encounter.  Meds ordered this encounter  Medications   sulfaSALAzine (AZULFIDINE) 500 MG tablet    Sig: Take 2 tablets (1,000 mg total) by mouth 2 (two) times daily. Start 1 tablet daily, then 1 tablet twice daily, and increase by 1 tablet per day each week until 4 total or as tolerated    Dispense:  120 tablet    Refill:  1     Follow-Up Instructions: Return in about 8 weeks (around 03/13/2023) for RA GC/SSZ start f/u 2mos.   Fuller Plan, MD  Note - This record has been created using [employer].  Chart creation errors have been sought, but may not always  have been located. Such creation errors do not reflect on  the standard of medical care.

## 2023-01-16 NOTE — Patient Instructions (Signed)
Sulfasalazine Tablets What is this medication? SULFASALAZINE (sul fa SAL a zeen) treats ulcerative colitis. It works by decreasing inflammation. It belongs to a group of medications called salicylates. This medicine may be used for other purposes; ask your health care provider or pharmacist if you have questions. COMMON BRAND NAME(S): Azulfidine, Sulfazine What should I tell my care team before I take this medication? They need to know if you have any of these conditions: Asthma Blood disorders or anemia Glucose-6-phosphate dehydrogenase (G6PD) deficiency Intestinal obstruction Kidney disease Liver disease Porphyria Urinary tract obstruction An unusual reaction to sulfasalazine, sulfa medications, salicylates, or other medications, foods, dyes, or preservatives Pregnant or trying to get pregnant Breast-feeding How should I use this medication? Take this medication by mouth with a full glass of water. Take it as directed on the prescription label at the same time every day. You can take it with or without food. If it upsets your stomach, take it with food. Keep taking it unless your care team tells you to stop. Talk to your care team about the use of this medication in children. While this medication may be prescribed for children as young as 6 years for selected conditions, precautions do apply. Patients over 65 years old may have a stronger reaction and need a smaller dose. Overdosage: If you think you have taken too much of this medicine contact a poison control center or emergency room at once. NOTE: This medicine is only for you. Do not share this medicine with others. What if I miss a dose? If you miss a dose, take it as soon as you can. If it is almost time for your next dose, take only that dose. Do not take double or extra doses. What may interact with this medication? Digoxin Folic acid This list may not describe all possible interactions. Give your health care provider a list  of all the medicines, herbs, non-prescription drugs, or dietary supplements you use. Also tell them if you smoke, drink alcohol, or use illegal drugs. Some items may interact with your medicine. What should I watch for while using this medication? Visit your care team for regular checks on your progress. Tell your care team if your symptoms do not start to get better or if they get worse. You will need frequent blood and urine checks. This medication can make you more sensitive to the sun. Keep out of the sun. If you cannot avoid being in the sun, wear protective clothing and use sunscreen. Do not use sun lamps or tanning beds/booths. Drink plenty of water while taking this medication. What side effects may I notice from receiving this medication? Side effects that you should report to your care team as soon as possible: Allergic reactions--skin rash, itching, hives, swelling of the face, lips, tongue, or throat Aplastic anemia--unusual weakness or fatigue, dizziness, headache, trouble breathing, increased bleeding or bruising Dry cough, shortness of breath or trouble breathing Heart muscle inflammation--unusual weakness or fatigue, shortness of breath, chest pain, fast or irregular heartbeat, dizziness, swelling of the ankles, feet, or hands Infection--fever, chills, cough, sore throat, wounds that don't heal, pain or trouble when passing urine, general feeling of discomfort or being unwell Kidney injury--decrease in the amount of urine, swelling of the ankles, hands, or feet Liver injury--right upper belly pain, loss of appetite, nausea, light-colored stool, dark yellow or brown urine, yellowing skin or eyes, unusual weakness or fatigue Rash, fever, and swollen lymph nodes Redness, blistering, peeling, or loosening of the skin, including inside   the mouth Side effects that usually do not require medical attention (report to your care team if they continue or are bothersome): Dark yellow or orange  saliva, sweat, or urine Dizziness Headache Loss of appetite Nausea Upset stomach Vomiting This list may not describe all possible side effects. Call your doctor for medical advice about side effects. You may report side effects to FDA at 1-800-FDA-1088. Where should I keep my medication? Keep out of the reach of children and pets. Store at room temperature between 15 and 30 degrees C (59 and 86 degrees F). Get rid of any unused medication after the expiration date. To get rid of medications that are no longer needed or have expired: Take the medications to a medication take-back program. Check with your pharmacy or law enforcement to find a location. If you cannot return the medication, check the label or package insert to see if the medication should be thrown out in the garbage or flushed down the toilet. If you are not sure, ask your care team. If it is safe to put it in the trash, take the medication out of the container. Mix the medication with cat litter, dirt, coffee grounds, or other unwanted substance. Seal the mixture in a bag or container. Put it in the trash. NOTE: This sheet is a summary. It may not cover all possible information. If you have questions about this medicine, talk to your doctor, pharmacist, or health care provider.  2023 Elsevier/Gold Standard (2021-06-27 00:00:00)  

## 2023-01-17 LAB — COMPLETE METABOLIC PANEL WITH GFR
AG Ratio: 1.2 (calc) (ref 1.0–2.5)
ALT: 5 U/L — ABNORMAL LOW (ref 6–29)
AST: 14 U/L (ref 10–35)
Albumin: 3.6 g/dL (ref 3.6–5.1)
Alkaline phosphatase (APISO): 135 U/L (ref 37–153)
BUN: 9 mg/dL (ref 7–25)
CO2: 30 mmol/L (ref 20–32)
Calcium: 9.5 mg/dL (ref 8.6–10.4)
Chloride: 100 mmol/L (ref 98–110)
Creat: 0.67 mg/dL (ref 0.50–1.05)
Globulin: 2.9 g/dL (calc) (ref 1.9–3.7)
Glucose, Bld: 99 mg/dL (ref 65–139)
Potassium: 4.2 mmol/L (ref 3.5–5.3)
Sodium: 136 mmol/L (ref 135–146)
Total Bilirubin: 0.2 mg/dL (ref 0.2–1.2)
Total Protein: 6.5 g/dL (ref 6.1–8.1)
eGFR: 96 mL/min/{1.73_m2} (ref >=60)

## 2023-01-17 LAB — CBC WITH DIFFERENTIAL/PLATELET
Absolute Monocytes: 528 cells/uL (ref 200–950)
Basophils Absolute: 64 cells/uL (ref 0–200)
Basophils Relative: 0.7 %
Eosinophils Absolute: 127 cells/uL (ref 15–500)
Eosinophils Relative: 1.4 %
HCT: 30.4 % — ABNORMAL LOW (ref 35.0–45.0)
Hemoglobin: 9.8 g/dL — ABNORMAL LOW (ref 11.7–15.5)
Lymphs Abs: 1993 cells/uL (ref 850–3900)
MCH: 27.1 pg (ref 27.0–33.0)
MCHC: 32.2 g/dL (ref 32.0–36.0)
MCV: 84 fL (ref 80.0–100.0)
MPV: 10.3 fL (ref 7.5–12.5)
Monocytes Relative: 5.8 %
Neutro Abs: 6388 cells/uL (ref 1500–7800)
Neutrophils Relative %: 70.2 %
Platelets: 391 10*3/uL (ref 140–400)
RBC: 3.62 10*6/uL — ABNORMAL LOW (ref 3.80–5.10)
RDW: 16.1 % — ABNORMAL HIGH (ref 11.0–15.0)
Total Lymphocyte: 21.9 %
WBC: 9.1 10*3/uL (ref 3.8–10.8)

## 2023-01-17 LAB — IRON,TIBC AND FERRITIN PANEL
%SAT: 11 % (calc) — ABNORMAL LOW (ref 16–45)
Ferritin: 70 ng/mL (ref 16–288)
Iron: 27 ug/dL — ABNORMAL LOW (ref 45–160)
TIBC: 243 mcg/dL (calc) — ABNORMAL LOW (ref 250–450)

## 2023-01-17 LAB — SEDIMENTATION RATE: Sed Rate: 55 mm/h — ABNORMAL HIGH (ref 0–30)

## 2023-01-18 ENCOUNTER — Other Ambulatory Visit (HOSPITAL_COMMUNITY): Payer: Self-pay

## 2023-01-18 ENCOUNTER — Other Ambulatory Visit: Payer: Self-pay

## 2023-01-19 ENCOUNTER — Telehealth: Payer: Self-pay

## 2023-01-19 NOTE — Telephone Encounter (Signed)
Attempted to contact the patient and could not leave a message because the mail box was full.

## 2023-01-19 NOTE — Progress Notes (Signed)
Sedimentation rate is high at 55 showing continued inflammation. Metabolic panel is normal no problem for starting the sulfasalazine as planned.  Her hemoglobin is 9.8 down from 11.5 so anemia is worse. Her iron is very low at 27 much worse compared to last year. She would need to follow up with her hematologist about the iron deficiency anemia if she needs more treatments.

## 2023-01-19 NOTE — Telephone Encounter (Signed)
Addressed in result note today. Please call her to update and mail her a copy.

## 2023-01-19 NOTE — Telephone Encounter (Signed)
Patient contacted the office and states she would like the results from her labs drawn on 01/16/2023. Patient states she would like to get a call back by 2:15 pm because she has an appointment at three. Patient's call back number is [phone]. Please advise. Patient would also like a copy of her lab results mailed to her.

## 2023-01-22 ENCOUNTER — Other Ambulatory Visit (HOSPITAL_COMMUNITY): Payer: Self-pay

## 2023-01-22 ENCOUNTER — Ambulatory Visit (INDEPENDENT_AMBULATORY_CARE_PROVIDER_SITE_OTHER): Payer: Medicare HMO | Admitting: Surgical

## 2023-01-22 DIAGNOSIS — M1711 Unilateral primary osteoarthritis, right knee: Secondary | ICD-10-CM

## 2023-01-22 DIAGNOSIS — M170 Bilateral primary osteoarthritis of knee: Secondary | ICD-10-CM | POA: Diagnosis not present

## 2023-01-22 DIAGNOSIS — M1712 Unilateral primary osteoarthritis, left knee: Secondary | ICD-10-CM | POA: Diagnosis not present

## 2023-01-22 NOTE — Telephone Encounter (Signed)
Copy of lab results mailed to the patient.

## 2023-01-23 ENCOUNTER — Ambulatory Visit (INDEPENDENT_AMBULATORY_CARE_PROVIDER_SITE_OTHER): Payer: Medicare HMO | Admitting: Orthopedic Surgery

## 2023-01-23 DIAGNOSIS — M205X9 Other deformities of toe(s) (acquired), unspecified foot: Secondary | ICD-10-CM | POA: Diagnosis not present

## 2023-01-23 DIAGNOSIS — M2021 Hallux rigidus, right foot: Secondary | ICD-10-CM | POA: Diagnosis not present

## 2023-01-23 DIAGNOSIS — M21611 Bunion of right foot: Secondary | ICD-10-CM | POA: Diagnosis not present

## 2023-01-23 NOTE — Progress Notes (Signed)
Office Visit Note   Patient: Vanessa Lara           Date of Birth: [birth year]           MRN: [id number] Visit Date: 01/22/2023 Requested by: [name], NP [address] [place],  [place] [address] PCP: [name], NP  Subjective: Chief Complaint  Patient presents with   Right Knee - Pain   Left Knee - Pain    HPI: Vanessa Lara is a 67 y.o. female who presents to the office reporting bilateral knee pain.  Patient complains of medial/lateral pain and knee left knee with more lateral pain on the right knee.  She previously had bilateral gel injections back in January 2024 which provided great relief of her knee pain up until a couple weeks ago.  She has had cortisone injections in both knees that also give her about 70% relief of her knee pain for several months and she would like to repeat these today.  She has no new falls or injuries.  No groin pain.  She continues to see Dr. Dimple Casey who has switched her to sulfasalazine which she is tolerating well.  She is concerned about her anemia.              ROS: All systems reviewed are negative as they relate to the chief complaint within the history of present illness.  Patient denies fevers or chills.  Assessment & Plan: Visit Diagnoses:  1. Primary osteoarthritis of left knee   2. Primary osteoarthritis of right knee     Plan: Patient is a 67 year old female who presents complaining of knee pain.  She has history of mild osteoarthritis of both knees with previous gel and cortisone injections that have given her good relief.  Sees Dr. Dimple Casey for rheumatoid arthritis and she is now on sulfasalazine.  Plan is to administer cortisone injections today.  Patient tolerated these procedures well.  We will preapproved her for gel injections to retry these at the next interval 6 months out from her last gel injection so we should be able to do these around April 25, 2023.  Follow-Up Instructions: No follow-ups on file.   Orders:   No orders of the defined types were placed in this encounter.  No orders of the defined types were placed in this encounter.     Procedures: Large Joint Inj: bilateral knee on 01/22/2023 11:07 AM Indications: diagnostic evaluation, joint swelling and pain Details: 18 G 1.5 in needle, superolateral approach  Arthrogram: No  Medications (Right): 5 mL lidocaine 1 %; 4 mL bupivacaine 0.25 %; 40 mg methylPREDNISolone acetate 40 MG/ML Medications (Left): 5 mL lidocaine 1 %; 4 mL bupivacaine 0.25 %; 40 mg methylPREDNISolone acetate 40 MG/ML Outcome: tolerated well, no immediate complications Procedure, treatment alternatives, risks and benefits explained, specific risks discussed. Consent was given by the patient. Immediately prior to procedure a time out was called to verify the correct patient, procedure, equipment, support staff and site/side marked as required. Patient was prepped and draped in the usual sterile fashion.      Clinical Data: No additional findings.  Objective: Vital Signs: There were no vitals taken for this visit.  Physical Exam:  Constitutional: Patient appears well-developed HEENT:  Head: Normocephalic Eyes:EOM are normal Neck: Normal range of motion Cardiovascular: Normal rate Pulmonary/chest: Effort normal Neurologic: Patient is alert Skin: Skin is warm Psychiatric: Patient has normal mood and affect  Ortho Exam: Ortho exam demonstrates bilateral  knees without effusion.  She has good range of motion with 0 degrees extension and greater than 120 degrees knee flexion in both knees.  She has no pain with hip range of motion.  She is able to perform straight leg raise with both legs.  No cellulitis or skin changes noted.  No calf tenderness.  Negative Homan sign.  Specialty Comments:  MRI CERVICAL SPINE WITHOUT CONTRAST   TECHNIQUE: Multiplanar, multisequence MR imaging of the cervical spine was performed. No intravenous contrast was administered.    COMPARISON:  None.   FINDINGS: Alignment: 2 mm retrolisthesis of C5 on C6.   Vertebrae: No acute fracture, evidence of discitis, or bone lesion.   Cord: Normal signal and morphology.   Posterior Fossa, vertebral arteries, paraspinal tissues: Posterior fossa demonstrates no focal abnormality. Vertebral artery flow voids are maintained. Paraspinal soft tissues are unremarkable.   Disc levels:   Discs: Degenerative disease with disc height loss at C4-5 and C5-6.   C2-3: No significant disc bulge. No neural foraminal stenosis. No central canal stenosis.   C3-4: Mild broad-based disc bulge. No foraminal or central canal stenosis.   C4-5: Mild broad-based disc bulge. No foraminal or central canal stenosis.   C5-6: Mild broad-based disc bulge. Bilateral uncovertebral degenerative changes. Moderate left and severe right foraminal stenosis. No spinal stenosis.   C6-7: Mild broad-based disc bulge. No foraminal or central canal stenosis.   C7-T1: No significant disc bulge. No neural foraminal stenosis. No central canal stenosis.   IMPRESSION: 1. At C5-6 there is a mild broad-based disc bulge. Bilateral uncovertebral degenerative changes. Moderate left and severe right foraminal stenosis. 2. Otherwise, mild cervical spine spondylosis as described above.     Electronically Signed   By: Elige Ko M.D.   On: 12/16/2021 10:51  Imaging: No results found.   PMFS History: Patient Active Problem List   Diagnosis Date Noted   High risk medication use 01/16/2023   Medication monitoring encounter 12/05/2021   Physical deconditioning 11/11/2021   Elevated serum lactate dehydrogenase (LDH) 08/17/2021   Vitamin D deficiency 08/17/2021   Chronic fatigue 07/25/2021   Vitamin B12 deficiency 07/19/2021   Hardening of the aorta (main artery of the heart) 06/21/2021   Solitary lung nodule 06/15/2021   Loss of weight 05/18/2021   Protein-calorie malnutrition, severe 05/18/2021    Osteoporosis 06/15/2020   Iron deficiency anemia 04/09/2020   Primary osteoarthritis involving multiple joints 12/31/2018   Rheumatoid arthritis involving multiple sites with positive rheumatoid factor 12/31/2018   Irritable bowel syndrome with both constipation and diarrhea 10/28/2018   Constipation 03/14/2016   Mitral valve prolapse 11/23/2015   High cholesterol 11/23/2015   Past Medical History:  Diagnosis Date   Anemia    Anxiety    Arthritis    RA   Cancer    Skin   CHF (congestive heart failure)    Collagen vascular disease    Depression    Endometriosis    High cholesterol    IBS (irritable bowel syndrome)    Lymphadenopathy    Mitral valve prolapse    RA (rheumatoid arthritis)    Rotator cuff tear    bilateral   Stroke 06/2022   Uterus, adenomyosis     Family History  Problem Relation Age of Onset   Colon cancer Other    Bone cancer Mother    Brain cancer Father    Brain cancer Sister    Breast cancer Sister    Renal Disease Sister  Pancreatic cancer Neg Hx    Esophageal cancer Neg Hx    Stomach cancer Neg Hx    Rectal cancer Neg Hx     Past Surgical History:  Procedure Laterality Date   AXILLARY LYMPH NODE BIOPSY Right 10/12/2020   Procedure: RIGHT AXILLARY LYMPH NODE BIOPSY EXCISION;  Surgeon: Almond Lint, MD;  Location: [facility];  Service: General;  Laterality: Right;   BUNIONECTOMY Right    CARPAL TUNNEL RELEASE Right    COLONOSCOPY     COLONOSCOPY N/A 12/13/2015   Procedure: COLONOSCOPY;  Surgeon: Malissa Hippo, MD;  [facility];  Service: Endoscopy;  Laterality: N/A;  9:55   Fatty tumor     2011 (left arm)   FOOT SURGERY     hammertoe   TONSILLECTOMY     TOTAL ABDOMINAL HYSTERECTOMY     2003   Social History   Occupational History   Occupation: retired  Tobacco Use   Smoking status: Some Days    Packs/day: 0.50    Years: 33.00    Additional pack years: 0.00    Total pack years: 16.50    Types:  Cigarettes    Last attempt to quit: 2013    Years since quitting: 11.3   Smokeless tobacco: Never  Vaping Use   Vaping Use: Never used  Substance and Sexual Activity   Alcohol use: No    Alcohol/week: 0.0 standard drinks of alcohol   Drug use: No   Sexual activity: Not Currently

## 2023-01-25 ENCOUNTER — Other Ambulatory Visit (HOSPITAL_COMMUNITY): Payer: Self-pay

## 2023-01-25 ENCOUNTER — Other Ambulatory Visit: Payer: Self-pay

## 2023-01-25 ENCOUNTER — Encounter: Payer: Self-pay | Admitting: Hematology

## 2023-01-25 ENCOUNTER — Encounter (HOSPITAL_COMMUNITY): Payer: Self-pay | Admitting: Adult Health Nurse Practitioner

## 2023-01-25 MED ORDER — SURE COMFORT PEN NEEDLES 30G X 8 MM MISC
1 refills | Status: DC
  Filled 2023-01-25: qty 100, 100d supply, fill #0
  Filled 2023-01-25 (×2): qty 100, 90d supply, fill #0
  Filled 2023-01-29 (×4): qty 100, 100d supply, fill #0

## 2023-01-28 ENCOUNTER — Encounter: Payer: Self-pay | Admitting: Orthopedic Surgery

## 2023-01-28 NOTE — Progress Notes (Signed)
Office Visit Note   Patient: Vanessa Lara           Date of Birth: 01-19-1956           MRN: [id number] Visit Date: 01/23/2023              Requested by: [name], NP [address] [place],  [place] [address] PCP: [name], NP  Chief Complaint  Patient presents with   Right Foot - Pain      HPI: Patient is a 67 year old woman who presents with persistent right foot pain she states it is getting worse.  She would like to discuss surgery.  Patient had previously been scheduled for a fusion of the right great toe MTP joint and a Weil osteotomy of the second third and fourth metatarsals with PIP resection of the second third and fourth toes.  Patient's surgery was canceled secondary to stroke.  Patient states she still has anemia has had transfusions currently on baby aspirin.  Assessment & Plan: Visit Diagnoses: No diagnosis found.  Plan: Discussed that we could proceed with surgery we would need to get her hemoglobin greater than 10.  Her last hemoglobin was 9.7.  Discussed that she would be nonweightbearing for 4 weeks.  Patient states she will follow-up with her hematologist to prepare for surgery.  Follow-Up Instructions: Return if symptoms worsen or fail to improve.   Ortho Exam  Patient is alert, oriented, no adenopathy, well-dressed, normal affect, normal respiratory effort. Examination patient is a strong dorsalis pedis pulse.  She has hallux rigidus of the right great toe with essentially no range of motion.  Attempted range of motion is painful.  She has fixed clawing of the second third and fourth toes with a long second third and fourth metatarsal.  Imaging: No results found. No images are attached to the encounter.  Labs: Lab Results  Component Value Date   HGBA1C 5.1 11/02/2022   HGBA1C 5.7 (H) 03/19/2020   HGBA1C 5.5 11/06/2019   ESRSEDRATE 55 (H) 01/16/2023   ESRSEDRATE 46 (H) 10/03/2022   ESRSEDRATE 2 05/18/2022   CRP 4.4  10/03/2022   CRP 32.9 (H) 12/05/2021   CRP 1.7 (H) 08/23/2020     Lab Results  Component Value Date   ALBUMIN 3.6 08/23/2020    No results found for: "MG" Lab Results  Component Value Date   VD25OH 62 05/18/2022   VD25OH 59.47 01/05/2022   VD25OH 33.7 05/27/2021    No results found for: "PREALBUMIN"    Latest Ref Rng & Units 01/16/2023    4:55 PM 10/03/2022    2:51 PM 05/18/2022   10:25 AM  CBC EXTENDED  WBC 3.8 - 10.8 Thousand/uL 9.1  14.2  6.9   RBC 3.80 - 5.10 Million/uL 3.62  3.94  4.06   Hemoglobin 11.7 - 15.5 g/dL 9.8  78.2  95.6   HCT 21.3 - 45.0 % 30.4  34.3  37.3   Platelets 140 - 400 Thousand/uL 391  362  213   NEUT# 1,500 - 7,800 cells/uL 6,388  11,772  3,491   Lymph# 850 - 3,900 cells/uL 1,993  1,633  2,498      There is no height or weight on file to calculate BMI.  Orders:  No orders of the defined types were placed in this encounter.  No orders of the defined types were placed in this encounter.    Procedures: No procedures performed  Clinical Data: No additional  findings.  ROS:  All other systems negative, except as noted in the HPI. Review of Systems  Objective: Vital Signs: There were no vitals taken for this visit.  Specialty Comments:  MRI CERVICAL SPINE WITHOUT CONTRAST   TECHNIQUE: Multiplanar, multisequence MR imaging of the cervical spine was performed. No intravenous contrast was administered.   COMPARISON:  None.   FINDINGS: Alignment: 2 mm retrolisthesis of C5 on C6.   Vertebrae: No acute fracture, evidence of discitis, or bone lesion.   Cord: Normal signal and morphology.   Posterior Fossa, vertebral arteries, paraspinal tissues: Posterior fossa demonstrates no focal abnormality. Vertebral artery flow voids are maintained. Paraspinal soft tissues are unremarkable.   Disc levels:   Discs: Degenerative disease with disc height loss at C4-5 and C5-6.   C2-3: No significant disc bulge. No neural foraminal stenosis.  No central canal stenosis.   C3-4: Mild broad-based disc bulge. No foraminal or central canal stenosis.   C4-5: Mild broad-based disc bulge. No foraminal or central canal stenosis.   C5-6: Mild broad-based disc bulge. Bilateral uncovertebral degenerative changes. Moderate left and severe right foraminal stenosis. No spinal stenosis.   C6-7: Mild broad-based disc bulge. No foraminal or central canal stenosis.   C7-T1: No significant disc bulge. No neural foraminal stenosis. No central canal stenosis.   IMPRESSION: 1. At C5-6 there is a mild broad-based disc bulge. Bilateral uncovertebral degenerative changes. Moderate left and severe right foraminal stenosis. 2. Otherwise, mild cervical spine spondylosis as described above.     Electronically Signed   By: Elige Ko M.D.   On: 12/16/2021 10:51  PMFS History: Patient Active Problem List   Diagnosis Date Noted   High risk medication use 01/16/2023   Medication monitoring encounter 12/05/2021   Physical deconditioning 11/11/2021   Elevated serum lactate dehydrogenase (LDH) 08/17/2021   Vitamin D deficiency 08/17/2021   Chronic fatigue 07/25/2021   Vitamin B12 deficiency 07/19/2021   Hardening of the aorta (main artery of the heart) (HCC) 06/21/2021   Solitary lung nodule 06/15/2021   Loss of weight 05/18/2021   Protein-calorie malnutrition, severe (HCC) 05/18/2021   Osteoporosis 06/15/2020   Iron deficiency anemia 04/09/2020   Primary osteoarthritis involving multiple joints 12/31/2018   Rheumatoid arthritis involving multiple sites with positive rheumatoid factor (HCC) 12/31/2018   Irritable bowel syndrome with both constipation and diarrhea 10/28/2018   Constipation 03/14/2016   Mitral valve prolapse 11/23/2015   High cholesterol 11/23/2015   Past Medical History:  Diagnosis Date   Anemia    Anxiety    Arthritis    RA   Cancer (HCC)    Skin   CHF (congestive heart failure) (HCC)    Collagen vascular  disease (HCC)    Depression    Endometriosis    High cholesterol    IBS (irritable bowel syndrome)    Lymphadenopathy    Mitral valve prolapse    RA (rheumatoid arthritis) (HCC)    Rotator cuff tear    bilateral   Stroke (HCC) 06/2022   Uterus, adenomyosis     Family History  Problem Relation Age of Onset   Colon cancer Other    Bone cancer Mother    Brain cancer Father    Brain cancer Sister    Breast cancer Sister    Renal Disease Sister    Pancreatic cancer Neg Hx    Esophageal cancer Neg Hx    Stomach cancer Neg Hx    Rectal cancer Neg Hx  Past Surgical History:  Procedure Laterality Date   AXILLARY LYMPH NODE BIOPSY Right 10/12/2020   Procedure: RIGHT AXILLARY LYMPH NODE BIOPSY EXCISION;  Surgeon: Almond Lint, MD;  Location: [facility];  Service: General;  Laterality: Right;   BUNIONECTOMY Right    CARPAL TUNNEL RELEASE Right    COLONOSCOPY     COLONOSCOPY N/A 12/13/2015   Procedure: COLONOSCOPY;  Surgeon: Malissa Hippo, MD;  [facility];  Service: Endoscopy;  Laterality: N/A;  9:55   Fatty tumor     2011 (left arm)   FOOT SURGERY     hammertoe   TONSILLECTOMY     TOTAL ABDOMINAL HYSTERECTOMY     2003   Social History   Occupational History   Occupation: retired  Tobacco Use   Smoking status: Some Days    Packs/day: 0.50    Years: 33.00    Additional pack years: 0.00    Total pack years: 16.50    Types: Cigarettes    Last attempt to quit: 2013    Years since quitting: 11.3   Smokeless tobacco: Never  Vaping Use   Vaping Use: Never used  Substance and Sexual Activity   Alcohol use: No    Alcohol/week: 0.0 standard drinks of alcohol   Drug use: No   Sexual activity: Not Currently

## 2023-01-29 ENCOUNTER — Encounter: Payer: Self-pay | Admitting: Hematology

## 2023-01-29 ENCOUNTER — Encounter (HOSPITAL_COMMUNITY): Payer: Self-pay | Admitting: Adult Health Nurse Practitioner

## 2023-01-29 ENCOUNTER — Other Ambulatory Visit (HOSPITAL_COMMUNITY): Payer: Self-pay

## 2023-01-30 ENCOUNTER — Other Ambulatory Visit (HOSPITAL_COMMUNITY): Payer: Self-pay

## 2023-01-30 ENCOUNTER — Other Ambulatory Visit: Payer: Self-pay

## 2023-01-31 ENCOUNTER — Other Ambulatory Visit: Payer: Self-pay

## 2023-02-02 ENCOUNTER — Inpatient Hospital Stay: Payer: Medicare HMO | Attending: Hematology

## 2023-02-02 DIAGNOSIS — E46 Unspecified protein-calorie malnutrition: Secondary | ICD-10-CM | POA: Insufficient documentation

## 2023-02-02 DIAGNOSIS — Z87891 Personal history of nicotine dependence: Secondary | ICD-10-CM | POA: Diagnosis not present

## 2023-02-02 DIAGNOSIS — D509 Iron deficiency anemia, unspecified: Secondary | ICD-10-CM | POA: Diagnosis not present

## 2023-02-02 DIAGNOSIS — Z7982 Long term (current) use of aspirin: Secondary | ICD-10-CM | POA: Insufficient documentation

## 2023-02-02 DIAGNOSIS — M069 Rheumatoid arthritis, unspecified: Secondary | ICD-10-CM | POA: Insufficient documentation

## 2023-02-02 DIAGNOSIS — Z79899 Other long term (current) drug therapy: Secondary | ICD-10-CM | POA: Diagnosis not present

## 2023-02-02 DIAGNOSIS — R5381 Other malaise: Secondary | ICD-10-CM | POA: Diagnosis not present

## 2023-02-02 DIAGNOSIS — R591 Generalized enlarged lymph nodes: Secondary | ICD-10-CM | POA: Insufficient documentation

## 2023-02-02 DIAGNOSIS — M810 Age-related osteoporosis without current pathological fracture: Secondary | ICD-10-CM | POA: Insufficient documentation

## 2023-02-02 LAB — CBC WITH DIFFERENTIAL/PLATELET
Abs Immature Granulocytes: 0.03 10*3/uL (ref 0.00–0.07)
Basophils Absolute: 0.1 10*3/uL (ref 0.0–0.1)
Basophils Relative: 1 %
Eosinophils Absolute: 0.3 10*3/uL (ref 0.0–0.5)
Eosinophils Relative: 3 %
HCT: 31.4 % — ABNORMAL LOW (ref 36.0–46.0)
Hemoglobin: 9.8 g/dL — ABNORMAL LOW (ref 12.0–15.0)
Immature Granulocytes: 0 %
Lymphocytes Relative: 22 %
Lymphs Abs: 2.1 10*3/uL (ref 0.7–4.0)
MCH: 27.1 pg (ref 26.0–34.0)
MCHC: 31.2 g/dL (ref 30.0–36.0)
MCV: 86.7 fL (ref 80.0–100.0)
Monocytes Absolute: 0.6 10*3/uL (ref 0.1–1.0)
Monocytes Relative: 6 %
Neutro Abs: 6.6 10*3/uL (ref 1.7–7.7)
Neutrophils Relative %: 68 %
Platelets: 372 10*3/uL (ref 150–400)
RBC: 3.62 MIL/uL — ABNORMAL LOW (ref 3.87–5.11)
RDW: 17.6 % — ABNORMAL HIGH (ref 11.5–15.5)
WBC: 9.6 10*3/uL (ref 4.0–10.5)
nRBC: 0 % (ref 0.0–0.2)

## 2023-02-02 LAB — COMPREHENSIVE METABOLIC PANEL
ALT: 10 U/L (ref 0–44)
AST: 19 U/L (ref 15–41)
Albumin: 3.4 g/dL — ABNORMAL LOW (ref 3.5–5.0)
Alkaline Phosphatase: 127 U/L — ABNORMAL HIGH (ref 38–126)
Anion gap: 12 (ref 5–15)
BUN: 8 mg/dL (ref 8–23)
CO2: 24 mmol/L (ref 22–32)
Calcium: 8.8 mg/dL — ABNORMAL LOW (ref 8.9–10.3)
Chloride: 97 mmol/L — ABNORMAL LOW (ref 98–111)
Creatinine, Ser: 0.59 mg/dL (ref 0.44–1.00)
GFR, Estimated: >60 mL/min (ref >60)
Glucose, Bld: 96 mg/dL (ref 70–99)
Potassium: 3.2 mmol/L — ABNORMAL LOW (ref 3.5–5.1)
Sodium: 133 mmol/L — ABNORMAL LOW (ref 135–145)
Total Bilirubin: 0.2 mg/dL — ABNORMAL LOW (ref 0.3–1.2)
Total Protein: 7.2 g/dL (ref 6.5–8.1)

## 2023-02-02 LAB — IRON AND TIBC
Iron: 28 ug/dL (ref 28–170)
Saturation Ratios: 9 % — ABNORMAL LOW (ref 10.4–31.8)
TIBC: 304 ug/dL (ref 250–450)
UIBC: 276 ug/dL

## 2023-02-02 LAB — FOLATE: Folate: 7 ng/mL (ref >5.9)

## 2023-02-02 LAB — FERRITIN: Ferritin: 44 ng/mL (ref 11–307)

## 2023-02-02 LAB — VITAMIN D 25 HYDROXY (VIT D DEFICIENCY, FRACTURES): Vit D, 25-Hydroxy: 72.68 ng/mL (ref 30–100)

## 2023-02-02 LAB — VITAMIN B12: Vitamin B-12: 628 pg/mL (ref 180–914)

## 2023-02-05 ENCOUNTER — Encounter: Payer: Self-pay | Admitting: Surgical

## 2023-02-05 ENCOUNTER — Other Ambulatory Visit (INDEPENDENT_AMBULATORY_CARE_PROVIDER_SITE_OTHER): Payer: Medicare HMO

## 2023-02-05 ENCOUNTER — Other Ambulatory Visit: Payer: Self-pay

## 2023-02-05 ENCOUNTER — Ambulatory Visit (INDEPENDENT_AMBULATORY_CARE_PROVIDER_SITE_OTHER): Payer: Medicare HMO | Admitting: Surgical

## 2023-02-05 DIAGNOSIS — M25532 Pain in left wrist: Secondary | ICD-10-CM

## 2023-02-05 DIAGNOSIS — M19032 Primary osteoarthritis, left wrist: Secondary | ICD-10-CM

## 2023-02-06 ENCOUNTER — Encounter: Payer: Self-pay | Admitting: Surgical

## 2023-02-06 ENCOUNTER — Other Ambulatory Visit (HOSPITAL_COMMUNITY): Payer: Self-pay

## 2023-02-06 LAB — METHYLMALONIC ACID, SERUM: Methylmalonic Acid, Quantitative: 182 nmol/L (ref 0–378)

## 2023-02-06 LAB — COPPER, SERUM: Copper: 139 ug/dL (ref 80–158)

## 2023-02-06 MED ORDER — LIDOCAINE HCL 1 % IJ SOLN
3.0000 mL | INTRAMUSCULAR | Status: AC | PRN
Start: 2023-02-05 — End: 2023-02-05
  Administered 2023-02-05: 3 mL

## 2023-02-06 MED ORDER — METHYLPREDNISOLONE ACETATE 40 MG/ML IJ SUSP
13.3300 mg | INTRAMUSCULAR | Status: AC | PRN
Start: 2023-02-05 — End: 2023-02-05
  Administered 2023-02-05: 13.33 mg via INTRA_ARTICULAR

## 2023-02-06 MED ORDER — BUPIVACAINE HCL 0.25 % IJ SOLN
0.6600 mL | INTRAMUSCULAR | Status: AC | PRN
Start: 2023-02-05 — End: 2023-02-05
  Administered 2023-02-05: .66 mL via INTRA_ARTICULAR

## 2023-02-06 NOTE — Progress Notes (Signed)
Office Visit Note   Patient: Vanessa Lara           Date of Birth: 01/02/1956           MRN: [id number] Visit Date: 02/05/2023 Requested by: [name], NP [address] [place],  [place] [address] PCP: [name], NP  Subjective: Chief Complaint  Patient presents with   Left Wrist - Follow-up    HPI: Vanessa Lara is a 67 y.o. female who presents to the office reporting left wrist pain.  Patient states that she has a history of discomfort in the left wrist that has come more severe pain in the last 3 weeks without any history of injury.  She localizes pain primarily to the dorsal and ulnar aspects of the wrist.  Pain is worse with ulnar deviation and with lifting or gripping things.  She does have some mechanical popping that is present in the wrist at times.  Pain will keep her up at night at times.  Denies any numbness or tingling.  Denies any radicular pain or new neck pain..                ROS: All systems reviewed are negative as they relate to the chief complaint within the history of present illness.  Patient denies fevers or chills.  Assessment & Plan: Visit Diagnoses:  1. Arthritis of left wrist   2. Pain in left wrist     Plan: Patient is a 67 year old female who presents for evaluation of left wrist pain.  She has about 3 weeks of increased wrist pain without any history of injury.  She has radiographs taken today demonstrating moderate degenerative changes throughout the radiocarpal joint with large cyst formation in the lunate.  She has history of rheumatoid arthritis.  We discussed options available to patient such as further evaluation with MRI scan versus cortisone injection to see if we can help with her pain.  Patient would like to proceed with cortisone injection.  Under ultrasound guidance, cortisone injection successfully delivered into the wrist joint with reevaluation 5 to 10 minutes later and patient noting good relief of her symptoms.  We  will see how she does in about 1 to 2 weeks and she will call to let us know.  If no improvement or if little improvement, neck step would be MRI scan.  Patient agreed with plan.  Follow-Up Instructions: No follow-ups on file.   Orders:  Orders Placed This Encounter  Procedures   XR Wrist Complete Left   US Guided Needle Placement - No Linked Charges   No orders of the defined types were placed in this encounter.     Procedures: Medium Joint Inj: L radiocarpal on 02/05/2023 12:05 PM Indications: pain and diagnostic evaluation Details: 22 G 1.5 in needle, ultrasound-guided dorsal approach Medications: 3 mL lidocaine 1 %; 13.33 mg methylPREDNISolone acetate 40 MG/ML; 0.66 mL bupivacaine 0.25 % Outcome: tolerated well, no immediate complications Procedure, treatment alternatives, risks and benefits explained, specific risks discussed. Consent was given by the patient. Immediately prior to procedure a time out was called to verify the correct patient, procedure, equipment, support staff and site/side marked as required. Patient was prepped and draped in the usual sterile fashion.       Clinical Data: No additional findings.  Objective: Vital Signs: There were no vitals taken for this visit.  Physical Exam:  Constitutional: Patient appears well-developed HEENT:  Head: Normocephalic Eyes:EOM are normal Neck:  Normal range of motion Cardiovascular: Normal rate Pulmonary/chest: Effort normal Neurologic: Patient is alert Skin: Skin is warm Psychiatric: Patient has normal mood and affect  Ortho Exam: Ortho exam demonstrates left wrist with 2+ radial pulse.  She has intact EPL, FPL, finger abduction, grip strength testing.  Negative Tinel sign over the carpal tunnel.  She has no tenderness over the scaphoid tubercle.  Mild tenderness over the anatomic snuffbox and 3-4 portal.  More moderate to severe tenderness over the 4-5 portal and ulnar fovea.  Pain is worse with ulnar deviation but  not change with radial deviation.  There is no cellulitis or skin changes noted.  Specialty Comments:  MRI CERVICAL SPINE WITHOUT CONTRAST   TECHNIQUE: Multiplanar, multisequence MR imaging of the cervical spine was performed. No intravenous contrast was administered.   COMPARISON:  None.   FINDINGS: Alignment: 2 mm retrolisthesis of C5 on C6.   Vertebrae: No acute fracture, evidence of discitis, or bone lesion.   Cord: Normal signal and morphology.   Posterior Fossa, vertebral arteries, paraspinal tissues: Posterior fossa demonstrates no focal abnormality. Vertebral artery flow voids are maintained. Paraspinal soft tissues are unremarkable.   Disc levels:   Discs: Degenerative disease with disc height loss at C4-5 and C5-6.   C2-3: No significant disc bulge. No neural foraminal stenosis. No central canal stenosis.   C3-4: Mild broad-based disc bulge. No foraminal or central canal stenosis.   C4-5: Mild broad-based disc bulge. No foraminal or central canal stenosis.   C5-6: Mild broad-based disc bulge. Bilateral uncovertebral degenerative changes. Moderate left and severe right foraminal stenosis. No spinal stenosis.   C6-7: Mild broad-based disc bulge. No foraminal or central canal stenosis.   C7-T1: No significant disc bulge. No neural foraminal stenosis. No central canal stenosis.   IMPRESSION: 1. At C5-6 there is a mild broad-based disc bulge. Bilateral uncovertebral degenerative changes. Moderate left and severe right foraminal stenosis. 2. Otherwise, mild cervical spine spondylosis as described above.     Electronically Signed   By: Elige Ko M.D.   On: 12/16/2021 10:51  Imaging: No results found.   PMFS History: Patient Active Problem List   Diagnosis Date Noted   High risk medication use 01/16/2023   Medication monitoring encounter 12/05/2021   Physical deconditioning 11/11/2021   Elevated serum lactate dehydrogenase (LDH) 08/17/2021    Vitamin D deficiency 08/17/2021   Chronic fatigue 07/25/2021   Vitamin B12 deficiency 07/19/2021   Hardening of the aorta (main artery of the heart) (HCC) 06/21/2021   Solitary lung nodule 06/15/2021   Loss of weight 05/18/2021   Protein-calorie malnutrition, severe (HCC) 05/18/2021   Osteoporosis 06/15/2020   Iron deficiency anemia 04/09/2020   Primary osteoarthritis involving multiple joints 12/31/2018   Rheumatoid arthritis involving multiple sites with positive rheumatoid factor (HCC) 12/31/2018   Irritable bowel syndrome with both constipation and diarrhea 10/28/2018   Constipation 03/14/2016   Mitral valve prolapse 11/23/2015   High cholesterol 11/23/2015   Past Medical History:  Diagnosis Date   Anemia    Anxiety    Arthritis    RA   Cancer (HCC)    Skin   CHF (congestive heart failure) (HCC)    Collagen vascular disease (HCC)    Depression    Endometriosis    High cholesterol    IBS (irritable bowel syndrome)    Lymphadenopathy    Mitral valve prolapse    RA (rheumatoid arthritis) (HCC)    Rotator cuff tear    bilateral  Stroke [facility]) 06/2022   Uterus, adenomyosis     Family History  Problem Relation Age of Onset   Colon cancer Other    Bone cancer Mother    Brain cancer Father    Brain cancer Sister    Breast cancer Sister    Renal Disease Sister    Pancreatic cancer Neg Hx    Esophageal cancer Neg Hx    Stomach cancer Neg Hx    Rectal cancer Neg Hx     Past Surgical History:  Procedure Laterality Date   AXILLARY LYMPH NODE BIOPSY Right 10/12/2020   Procedure: RIGHT AXILLARY LYMPH NODE BIOPSY EXCISION;  Surgeon: Almond Lint, MD;  Location: [facility];  Service: General;  Laterality: Right;   BUNIONECTOMY Right    CARPAL TUNNEL RELEASE Right    COLONOSCOPY     COLONOSCOPY N/A 12/13/2015   Procedure: COLONOSCOPY;  Surgeon: Malissa Hippo, MD;  [facility];  Service: Endoscopy;  Laterality: N/A;  9:55   Fatty tumor      2011 (left arm)   FOOT SURGERY     hammertoe   TONSILLECTOMY     TOTAL ABDOMINAL HYSTERECTOMY     2003   Social History   Occupational History   Occupation: retired  Tobacco Use   Smoking status: Some Days    Packs/day: 0.50    Years: 33.00    Additional pack years: 0.00    Total pack years: 16.50    Types: Cigarettes    Last attempt to quit: 2013    Years since quitting: 11.3   Smokeless tobacco: Never  Vaping Use   Vaping Use: Never used  Substance and Sexual Activity   Alcohol use: No    Alcohol/week: 0.0 standard drinks of alcohol   Drug use: No   Sexual activity: Not Currently

## 2023-02-07 NOTE — Progress Notes (Unsigned)
[facility] [address], [place] [address]   CLINIC:  Medical Oncology/Hematology  PCP:  [name], NP [address] [place] [address] [phone]   REASON FOR VISIT:  Follow-up for iron deficiency anemia and lymphadenopathy  CURRENT THERAPY: Intermittent Feraheme  INTERVAL HISTORY:   [name] 67 y.o. female returns for routine follow-up of iron deficiency anemia and lymphadenopathy.  She was last seen by Rojelio Brenner PA-C on 11/29/2022.    At today's visit, she reports feeling poorly.  She continues to suffer from malnutrition, weight loss, severe fatigue, and general malaise.  She is seeing multiple specialists for workup and management of these chronic issues.  She has noted to have some tachycardia and borderline hypotension at today's appointment, which is within her baseline range and likely secondary to her severe no malnutrition and poor oral intake.  Patient is tearful today, and demanding blood transfusion; she becomes distraught when told that her hemoglobin is not low enough to justify PRBC.  She felt no improvement after IV Venofer in March 2024.  She continues to deny any obvious rectal bleeding, melena, epistaxis, or other source of blood loss.  She denies any new lumps or bumps.  She reports that she feels cold all the time.  She denies any other B symptoms such as fever, chills, or night sweats.  She remains severely fatigued.  She has no energy and 70% appetite.  Her weight remains low but overall stable for the past year.  ASSESSMENT & PLAN:  1.   Normocytic anemia: Anemia of chronic disease +/- iron deficiency - History of significant iron deficiency with mild anemia (Hgb ranging from 11.0-12.0) - EGD (11/26/2020): Erythematous mucosa in gastric body, mildly ringed appearance in the esophagus, normal examined duodenum - Capsule endoscopy (02/08/2021): Complete capsule endoscopy with no findings to explain patient's  symptoms - Colonoscopy (08/12/2020): Nonbleeding external and internal hemorrhoids, diverticulosis - CT enterography abdomen/pelvis (01/04/2021): No abnormal findings to explain chronic iron deficiency - We reviewed her prior anemia work-up which showed normal B12, folic acid and methylmalonic acid levels.  Copper levels were normal.  SPEP was negative. - Most recent IV iron with Venofer 300 mg x 3 in March 2024  - No bright red blood per rectum or melena - She has severe chronic fatigue, which is multifactorial  - Most recent labs (02/02/2023): Hgb 9.8/MCV 86.7, ferritin 44, iron saturation 9%. Normal B12 628, normal MMA Normal folate, copper, vitamin D Normal creatinine - Differential diagnosis favors anemia of chronic disease with functional iron deficiency and likely malabsorption secondary to chronic inflammation.  However, the fact that her blood and iron levels dropped after receiving IV iron is concerning for occult blood loss. - PLAN: Check stool cards x 3. - Venofer 300 mg x 4 doses. - Labs and RTC in 2 months  - GI referral was recommended, but patient refused at this time. - Patient requests that her labs and office note today be sent to her orthopedist (Dr. Lajoyce Corners), as she reports he is considering surgery once her hemoglobin is >10.0   2.  Weight loss and malnutrition - Patient has had intermittent episodes of weight loss since 2020 - She follows with her PCP, GI, rheumatology, and endocrinology for severe IBS and weight loss. - She is up-to-date on routine cancer screening such as colonoscopy and mammogram. - She does not have any clear focal symptoms that would require imaging at this time. -  Due to strong family history of malignancy, she was previously recommended for [facility] testing, but this was not covered by insurance and was cost prohibitive. - She has had extensive previous imaging including PET scan in October 2021, all of which has been negative for malignancy - Weight  loss suspected to be due to her underlying inflammatory condition - She is taking Marinol as prescribed by PCP - Weight today is overall stable within the same 5 pound range for the past year - PLAN: Weight loss and malnutrition is being managed by her PCP.  3.  Leukocytosis & thrombocytosis - Past CBCs reveal intermittent leukocytosis (neutrophil predominant) and thrombocytosis  - MPN work-up was negative for JAK2 V617F, JAK2 exon 12, MPL and CALR.  Reflex testing to NGS did not reveal any abnormal mutations. - BCR/ABL FISH was negative - Inflammatory markers (08/23/2020) significantly elevated with ESR 62 and CRP 1.7.  LDH was normal at 157. - Findings are consistent with chronic inflammation and reactive leukocytosis/thrombocytosis - No indication for bone marrow biopsy or further work-up at this time - Most recent CBC (02/02/2023) with normal WBC and platelets - PLAN: Reactive leukocytosis/thrombocytosis, without indication for bone marrow biopsy or further work-up/treatment at this time.  4.  PET positive right axillary lymph node - PET scan obtained 07/09/2020 due to unintentional weight loss showed mildly hypermetabolic right axillary lymph node - Right axillary lymph node was palpable on exam (November 2021) - She has completed 5 days of azithromycin as she had a history of cat bite. - She was advised by Dr. Leonides Schanz that her lymph node was likely reactive and related to her underlying inflammatory disease, but patient insisted on biopsy. - Patient opted for right axillary lymph node biopsy (10/12/2020), which showed benign reactive lymph node, no malignancy   5.  PET positive ileocecal lesion - PET scan obtained 07/09/2020 for unintentional weight loss showed accentuated activity along the distalmost terminal ileum and ileocecal valve, possibly physiologic since there was no mass or specific abnormality in this region on diagnostic CT as of 06/23/2020. - Patient had colonoscopy on  08/12/2020, in which the cecum and IC valvle appeared normal. Terminal ileum could not be intubated due to restricted sigmoid and redundant colon. - CT abdomen/pelvis with contrast (01/04/2021): No abnormal bowel wall thickening or abnormal mural enhancement in the stomach, small bowel, or colon.  No findings to suggest inflammatory or chronic fibrous small bowel stricture.  No mesenteric or intraperitoneal free fluid.  Terminal ileum was normal, no edema or inflammation in the region of the cecum.   6.  Osteoporosis: - DEXA scan on 04/13/2020 shows T score -4.2. - She follows with endocrinology and orthopedics   7.  Rheumatoid arthritis - She follows with Dr. Dierdre Forth / Azucena Fallen of [facility]  PLAN SUMMARY: >> Stool cards x 3 >> Venofer 300 mg x 4 >> CBC/D + BB sample in 4 weeks >> Labs in 2 months = CBC/D, ferritin, iron/TIBC >> OFFICE visit in 2 months      REVIEW OF SYSTEMS:  Review of Systems  Constitutional:  Positive for fatigue. Negative for appetite change, chills, diaphoresis, fever and unexpected weight change.  HENT:   Negative for lump/mass and nosebleeds.   Eyes:  Negative for eye problems.  Respiratory:  Positive for shortness of breath (with exertion). Negative for cough and hemoptysis.   Cardiovascular:  Positive for palpitations. Negative for chest pain and leg swelling.  Gastrointestinal:  Positive for abdominal pain, constipation, diarrhea and  nausea. Negative for blood in stool and vomiting.  Genitourinary:  Negative for hematuria.   Skin: Negative.   Neurological:  Positive for dizziness and headaches. Negative for light-headedness.  Hematological:  Does not bruise/bleed easily.  Psychiatric/Behavioral:  Negative for decreased concentration. The patient is nervous/anxious.    PHYSICAL EXAM:  ECOG PERFORMANCE STATUS: 2 - Symptomatic, <50% confined to bed  There were no vitals filed for this visit. There were no vitals filed for this  visit. Physical Exam Constitutional:      Appearance: She is cachectic.     Comments: Weak and frail appearing on exam  HENT:     Head: Normocephalic and atraumatic.     Mouth/Throat:     Mouth: Mucous membranes are moist.  Eyes:     Extraocular Movements: Extraocular movements intact.     Pupils: Pupils are equal, round, and reactive to light.  Cardiovascular:     Rate and Rhythm: Regular rhythm. Tachycardia present.     Pulses: Normal pulses.     Heart sounds: Normal heart sounds.  Pulmonary:     Effort: Pulmonary effort is normal.     Breath sounds: Normal breath sounds.  Abdominal:     General: Bowel sounds are normal.     Palpations: Abdomen is soft.     Tenderness: There is no abdominal tenderness.  Musculoskeletal:        General: No swelling.     Right lower leg: No edema.     Left lower leg: No edema.  Lymphadenopathy:     Cervical: No cervical adenopathy.  Skin:    General: Skin is warm and dry.  Neurological:     General: No focal deficit present.     Mental Status: She is alert and oriented to person, place, and time.  Psychiatric:        Mood and Affect: Mood is anxious. Affect is tearful.        Behavior: Behavior normal.    PAST MEDICAL/SURGICAL HISTORY:  Past Medical History:  Diagnosis Date   Anemia    Anxiety    Arthritis    RA   Cancer (HCC)    Skin   CHF (congestive heart failure) (HCC)    Collagen vascular disease (HCC)    Depression    Endometriosis    High cholesterol    IBS (irritable bowel syndrome)    Lymphadenopathy    Mitral valve prolapse    RA (rheumatoid arthritis) (HCC)    Rotator cuff tear    bilateral   Stroke (HCC) 06/2022   Uterus, adenomyosis    Past Surgical History:  Procedure Laterality Date   AXILLARY LYMPH NODE BIOPSY Right 10/12/2020   Procedure: RIGHT AXILLARY LYMPH NODE BIOPSY EXCISION;  Surgeon: Almond Lint, MD;  Location: [facility];  Service: General;  Laterality: Right;   BUNIONECTOMY  Right    CARPAL TUNNEL RELEASE Right    COLONOSCOPY     COLONOSCOPY N/A 12/13/2015   Procedure: COLONOSCOPY;  Surgeon: Malissa Hippo, MD;  [facility];  Service: Endoscopy;  Laterality: N/A;  9:55   Fatty tumor     2011 (left arm)   FOOT SURGERY     hammertoe   TONSILLECTOMY     TOTAL ABDOMINAL HYSTERECTOMY     2003    SOCIAL HISTORY:  Social History   Socioeconomic History   Marital status: Single    Spouse name: Not on file   Number of children: Not on  file   Years of education: Not on file   Highest education level: Not on file  Occupational History   Occupation: retired  Tobacco Use   Smoking status: Some Days    Packs/day: 0.50    Years: 33.00    Additional pack years: 0.00    Total pack years: 16.50    Types: Cigarettes    Last attempt to quit: 2013    Years since quitting: 11.3   Smokeless tobacco: Never  Vaping Use   Vaping Use: Never used  Substance and Sexual Activity   Alcohol use: No    Alcohol/week: 0.0 standard drinks of alcohol   Drug use: No   Sexual activity: Not Currently  Other Topics Concern   Not on file  Social History Narrative   Not on file   Social Determinants of Health   Financial Resource Strain: Not on file  Food Insecurity: Not on file  Transportation Needs: Not on file  Physical Activity: Not on file  Stress: Not on file  Social Connections: Not on file  Intimate Partner Violence: Not on file    FAMILY HISTORY:  Family History  Problem Relation Age of Onset   Colon cancer Other    Bone cancer Mother    Brain cancer Father    Brain cancer Sister    Breast cancer Sister    Renal Disease Sister    Pancreatic cancer Neg Hx    Esophageal cancer Neg Hx    Stomach cancer Neg Hx    Rectal cancer Neg Hx     CURRENT MEDICATIONS:  Outpatient Encounter Medications as of 02/08/2023  Medication Sig Note   acetaminophen (TYLENOL) 500 MG tablet Take 500 mg by mouth every 6 (six) hours as needed.    alendronate  (FOSAMAX) 70 MG tablet     ASPIRIN 81 PO Take 1 tablet by mouth daily.    atorvastatin (LIPITOR) 20 MG tablet Take 1 tablet (20 mg total) by mouth daily.    BD SYRINGE SLIP TIP 25G X 5/8" 1 ML MISC USE AS DIRECTED FOR ONCE MONTHLY B12 INJECTIONS    Cholecalciferol (VITAMIN D3) 25 MCG (1000 UT) CAPS 1 capsule Orally Once a day for 30 day(s)    cyanocobalamin (,VITAMIN B-12,) 1000 MCG/ML injection Inject 1,000 mcg into the skin every 30 (thirty) days. (Patient not taking: Reported on 01/16/2023)    cyclobenzaprine (FLEXERIL) 5 MG tablet Take 1 tablet (5 mg total) by mouth at bedtime as needed for muscle spasms.    dicyclomine (BENTYL) 10 MG capsule Take 1 capsule (10 mg total) by mouth 4 (four) times daily as needed for spasms.    fentaNYL (DURAGESIC) 25 MCG/HR Place 1 patch onto the skin every 3 (three) days. (Patient not taking: Reported on 01/16/2023) 08/07/2022: 08/07/22 50 mcg   fentaNYL (DURAGESIC) 50 MCG/HR Place 1 patch onto the skin every 3 (three) days.    FORTEO 600 MCG/2.4ML SOPN ADMINISTER 0.08 ML UNDER THE SKIN DAILY    furosemide (LASIX) 20 MG tablet Take 20 mg by mouth daily as needed. (Patient not taking: Reported on 01/16/2023)    Heat Wraps (THERMACARE BACK/HIP) MISC 1 application by Does not apply route daily.    HYDROcodone-acetaminophen (NORCO/VICODIN) 5-325 MG tablet 1 tablet as needed Orally every 6 hrs    hydroxychloroquine (PLAQUENIL) 200 MG tablet 1 tablet with food or milk Orally every other day for 30 days    hydrOXYzine (ATARAX) 25 MG tablet Take 25 mg by mouth 2 (two)  times daily as needed for itching. (Patient not taking: Reported on 01/16/2023)    hydrOXYzine (ATARAX) 50 MG tablet Take by mouth. (Patient not taking: Reported on 01/16/2023)    Insulin Pen Needle (SURE COMFORT PEN NEEDLES) 30G X 8 MM MISC Use as directed    lidocaine (LIDODERM) 5 % one patch daily.    lipase/protease/amylase (CREON) 12000-38000 units CPEP capsule Use 1 capsule with meals and snacks (Patient  not taking: Reported on 01/16/2023)    LORazepam (ATIVAN) 0.5 MG tablet TAKE 1/2 TO 1 TABLET BY MOUTH AT BEDTIME AS NEEDED    megestrol (MEGACE) 40 MG tablet Take 1 tablet (40 mg total) by mouth daily. (Patient not taking: Reported on 01/16/2023)    metoprolol tartrate (LOPRESSOR) 25 MG tablet  (Patient not taking: Reported on 01/16/2023)    MYRBETRIQ 25 MG TB24 tablet Take 25 mg by mouth daily.    naloxone (NARCAN) nasal spray 4 mg/0.1 mL SMARTSIG:Both Nares    naratriptan (AMERGE) 2.5 MG tablet Take 1 tablet (2.5 mg total) by mouth as needed for migraine. Take one (1) tablet at onset of headache; if returns or does not resolve, may repeat after 4 hours; do not exceed five (5) mg in 24 hours. (Patient not taking: Reported on 01/16/2023)    ondansetron (ZOFRAN-ODT) 4 MG disintegrating tablet Take 4 mg by mouth every 8 (eight) hours as needed.    pantoprazole (PROTONIX) 40 MG tablet TAKE 1 TABLET(40 MG) BY MOUTH TWICE DAILY    PARoxetine (PAXIL) 20 MG tablet Take 20 mg by mouth daily.    predniSONE (DELTASONE) 5 MG tablet Take 1 tablet (5 mg total) by mouth daily with breakfast. (Patient not taking: Reported on 01/16/2023) 08/07/2022: As needed   pregabalin (LYRICA) 25 MG capsule Take 1 capsule (25 mg total) by mouth 2 (two) times daily. (Patient not taking: Reported on 01/16/2023)    promethazine (PHENERGAN) 25 MG tablet Take 0.5-1 tablets (12.5-25 mg total) by mouth every 6 (six) hours as needed for nausea or vomiting.    sulfaSALAzine (AZULFIDINE) 500 MG tablet Take 2 tablets (1,000 mg total) by mouth 2 (two) times daily. Start 1 tablet daily, then 1 tablet twice daily, and increase by 1 tablet per day each week until 4 total or as tolerated    Syringe/Needle, Disp, (SYRINGE 3CC/23GX1") 23G X 1" 3 ML MISC by Does not apply route.    Teriparatide, Recombinant, (FORTEO) 600 MCG/2.4ML SOPN INJECT 20 MCG UNDER THE SKIN (SUBCUTANEOUS INJECTION) DAILY    traMADol (ULTRAM) 50 MG tablet Take 1 tablet (50 mg  total) by mouth every 12 (twelve) hours as needed.    traZODone (DESYREL) 100 MG tablet Take 1 tablet (100 mg total) by mouth at bedtime as needed for sleep.    No facility-administered encounter medications on file as of 02/08/2023.    ALLERGIES:  Allergies  Allergen Reactions   Abatacept Other (See Comments)   Codeine Other (See Comments)    Stomach pains/constipation- due to IBS   Golimumab Other (See Comments)   Hydrocodone Other (See Comments)    She does not want to take this due to intolerance to codeine.   Methylprednisolone Diarrhea and Other (See Comments)    "Made my IBS flare up"  Diarrhea, nausea   Methotrexate Derivatives Other (See Comments)    Agitation; pt stated, "I get no sleep; one dose caused no sleep for 7 days and 7 nights - that was when I got a 0.5 injection"   Prednisone Rash  Cannot take prednisone by mouth ---- red rash all over    LABORATORY DATA:  I have reviewed the labs as listed.  CBC    Component Value Date/Time   WBC 9.6 02/02/2023 1339   RBC 3.62 (L) 02/02/2023 1339   HGB 9.8 (L) 02/02/2023 1339   HGB 11.5 10/07/2020 1321   HCT 31.4 (L) 02/02/2023 1339   HCT 34.0 10/07/2020 1321   PLT 372 02/02/2023 1339   PLT 462 (H) 10/07/2020 1321   MCV 86.7 02/02/2023 1339   MCV 91 10/07/2020 1321   MCH 27.1 02/02/2023 1339   MCHC 31.2 02/02/2023 1339   RDW 17.6 (H) 02/02/2023 1339   RDW 13.7 10/07/2020 1321   LYMPHSABS 2.1 02/02/2023 1339   LYMPHSABS 2.9 10/07/2020 1321   MONOABS 0.6 02/02/2023 1339   EOSABS 0.3 02/02/2023 1339   EOSABS 0.1 10/07/2020 1321   BASOSABS 0.1 02/02/2023 1339   BASOSABS 0.1 10/07/2020 1321      Latest Ref Rng & Units 02/02/2023    1:39 PM 01/16/2023    4:55 PM 10/03/2022    2:51 PM  CMP  Glucose 70 - 99 mg/dL 96  99  93   BUN 8 - 23 mg/dL 8  9  10    Creatinine 0.44 - 1.00 mg/dL 5.95  6.38  7.56   Sodium 135 - 145 mmol/L 133  136  140   Potassium 3.5 - 5.1 mmol/L 3.2  4.2  4.3   Chloride 98 - 111 mmol/L 97   100  102   CO2 22 - 32 mmol/L 24  30  29    Calcium 8.9 - 10.3 mg/dL 8.8  9.5  43.3   Total Protein 6.5 - 8.1 g/dL 7.2  6.5  7.2   Total Bilirubin 0.3 - 1.2 mg/dL 0.2  0.2  0.2   Alkaline Phos 38 - 126 U/L 127     AST 15 - 41 U/L 19  14  16    ALT 0 - 44 U/L 10  5  10      DIAGNOSTIC IMAGING:  I have independently reviewed the relevant imaging and discussed with the patient.   WRAP UP:  All questions were answered. The patient knows to call the clinic with any problems, questions or concerns.  Medical decision making: Moderate  Time spent on visit: I spent 20 minutes counseling the patient face to face. The total time spent in the appointment was 30 minutes and more than 50% was on counseling.  Carnella Guadalajara, PA-C  02/08/23 10:25 AM

## 2023-02-08 ENCOUNTER — Inpatient Hospital Stay (HOSPITAL_BASED_OUTPATIENT_CLINIC_OR_DEPARTMENT_OTHER): Payer: Medicare HMO | Admitting: Physician Assistant

## 2023-02-08 ENCOUNTER — Other Ambulatory Visit: Payer: Self-pay

## 2023-02-08 VITALS — BP 97/47 | HR 89 | Temp 98.0°F | Resp 19 | Ht <= 58 in | Wt 71.9 lb

## 2023-02-08 DIAGNOSIS — D509 Iron deficiency anemia, unspecified: Secondary | ICD-10-CM

## 2023-02-08 DIAGNOSIS — E46 Unspecified protein-calorie malnutrition: Secondary | ICD-10-CM

## 2023-02-08 DIAGNOSIS — D500 Iron deficiency anemia secondary to blood loss (chronic): Secondary | ICD-10-CM

## 2023-02-08 MED ORDER — BUPIVACAINE HCL 0.25 % IJ SOLN
4.0000 mL | INTRAMUSCULAR | Status: AC | PRN
Start: 2023-01-22 — End: 2023-01-22
  Administered 2023-01-22: 4 mL via INTRA_ARTICULAR

## 2023-02-08 MED ORDER — LIDOCAINE HCL 1 % IJ SOLN
5.0000 mL | INTRAMUSCULAR | Status: AC | PRN
Start: 2023-01-22 — End: 2023-01-22
  Administered 2023-01-22: 5 mL

## 2023-02-08 MED ORDER — BUPIVACAINE HCL 0.25 % IJ SOLN
4.0000 mL | INTRAMUSCULAR | Status: AC | PRN
  Administered 2023-01-22: 4 mL via INTRA_ARTICULAR

## 2023-02-08 MED ORDER — METHYLPREDNISOLONE ACETATE 40 MG/ML IJ SUSP
40.0000 mg | INTRAMUSCULAR | Status: AC | PRN
Start: 2023-01-22 — End: 2023-01-22
  Administered 2023-01-22: 40 mg via INTRA_ARTICULAR

## 2023-02-08 NOTE — Patient Instructions (Signed)
[facility] at [facility] Discharge Instructions  You were seen today by Rojelio Brenner PA-C for your iron deficiency.    TREATMENT: IV iron x 4 doses  LABS: Recheck blood in 4 weeks  OTHER TESTS: Check stool cards x 3  FOLLOW-UP APPOINTMENT: Office visit in 2 months, after labs   Thank you for choosing [facility] at [facility] to provide your oncology and hematology care.  To afford each patient quality time with our provider, please arrive at least 15 minutes before your scheduled appointment time.   If you have a lab appointment with the Cancer Center please come in thru the Main Entrance and check in at the main information desk.  You need to re-schedule your appointment should you arrive 10 or more minutes late.  We strive to give you quality time with our providers, and arriving late affects you and other patients whose appointments are after yours.  Also, if you no show three or more times for appointments you may be dismissed from the clinic at the providers discretion.     Again, thank you for choosing [facility].  Our hope is that these requests will decrease the amount of time that you wait before being seen by our physicians.       _____________________________________________________________  Should you have questions after your visit to [facility], please contact our office at [phone] and follow the prompts.  Our office hours are 8:00 a.m. and 4:30 p.m. Monday - Friday.  Please note that voicemails left after 4:00 p.m. may not be returned until the following business day.  We are closed weekends and major holidays.  You do have access to a nurse 24-7, just call the main number to the clinic [phone] and do not press any options, hold on the line and a nurse will answer the phone.    For prescription refill requests, have your pharmacy contact our office and allow 72 hours.    Due to  Covid, you will need to wear a mask upon entering the hospital. If you do not have a mask, a mask will be given to you at the Main Entrance upon arrival. For doctor visits, patients may have 1 support person age 43 or older with them. For treatment visits, patients can not have anyone with them due to social distancing guidelines and our immunocompromised population.

## 2023-02-12 ENCOUNTER — Inpatient Hospital Stay: Payer: Medicare HMO

## 2023-02-12 ENCOUNTER — Other Ambulatory Visit: Payer: Self-pay | Admitting: Physician Assistant

## 2023-02-12 VITALS — BP 90/57 | HR 87 | Temp 97.6°F | Resp 18 | Wt 71.5 lb

## 2023-02-12 DIAGNOSIS — D509 Iron deficiency anemia, unspecified: Secondary | ICD-10-CM | POA: Diagnosis not present

## 2023-02-12 DIAGNOSIS — D500 Iron deficiency anemia secondary to blood loss (chronic): Secondary | ICD-10-CM

## 2023-02-12 MED ORDER — SODIUM CHLORIDE 0.9 % IV SOLN: Freq: Once | INTRAVENOUS | Status: AC

## 2023-02-12 MED ORDER — SODIUM CHLORIDE 0.9 % IV SOLN
300.0000 mg | Freq: Once | INTRAVENOUS | Status: AC
  Administered 2023-02-12: 300 mg via INTRAVENOUS
  Filled 2023-02-12: qty 300

## 2023-02-12 NOTE — Progress Notes (Signed)
Patient has changed her mind regarding GI follow-up and requests a referral to Dr. Marsa Aris at [facility].  Referral entered today.  Patient aware.

## 2023-02-12 NOTE — Progress Notes (Signed)
Patient presents today for iron infusion.  Patient is in satisfactory condition with no new complaints voiced.  Vital signs are stable. Patient took premeds of Benadryl and Tylenol at 8:20am prior to arrival. We will proceed with infusion per provider orders.    Patient tolerated treatment well with no complaints voiced.  Patient left via wheelchair in stable condition with caregiver.  Vital signs stable at discharge.  Follow up as scheduled.

## 2023-02-12 NOTE — Patient Instructions (Signed)
[facility]  Discharge Instructions: Thank you for choosing [facility] to provide your oncology and hematology care.  If you have a lab appointment with the Cancer Center - please note that after April 8th, 2024, all labs will be drawn in the cancer center.  You do not have to check in or register with the main entrance as you have in the past but will complete your check-in in the cancer center.  Wear comfortable clothing and clothing appropriate for easy access to any Portacath or PICC line.   We strive to give you quality time with your provider. You may need to reschedule your appointment if you arrive late (15 or more minutes).  Arriving late affects you and other patients whose appointments are after yours.  Also, if you miss three or more appointments without notifying the office, you may be dismissed from the clinic at the provider's discretion.      For prescription refill requests, have your pharmacy contact our office and allow 72 hours for refills to be completed.    Today you received the following Venofer.   Iron Sucrose Injection What is this medication? IRON SUCROSE (EYE ern SOO krose) treats low levels of iron (iron deficiency anemia) in people with kidney disease. Iron is a mineral that plays an important role in making red blood cells, which carry oxygen from your lungs to the rest of your body. This medicine may be used for other purposes; ask your health care provider or pharmacist if you have questions. COMMON BRAND NAME(S): Venofer What should I tell my care team before I take this medication? They need to know if you have any of these conditions: Anemia not caused by low iron levels Heart disease High levels of iron in the blood Kidney disease Liver disease An unusual or allergic reaction to iron, other medications, foods, dyes, or preservatives Pregnant or trying to get pregnant Breastfeeding How should I use this  medication? This medication is for infusion into a vein. It is given in a hospital or clinic setting. Talk to your care team about the use of this medication in children. While this medication may be prescribed for children as young as 2 years for selected conditions, precautions do apply. Overdosage: If you think you have taken too much of this medicine contact a poison control center or emergency room at once. NOTE: This medicine is only for you. Do not share this medicine with others. What if I miss a dose? Keep appointments for follow-up doses. It is important not to miss your dose. Call your care team if you are unable to keep an appointment. What may interact with this medication? Do not take this medication with any of the following: Deferoxamine Dimercaprol Other iron products This medication may also interact with the following: Chloramphenicol Deferasirox This list may not describe all possible interactions. Give your health care provider a list of all the medicines, herbs, non-prescription drugs, or dietary supplements you use. Also tell them if you smoke, drink alcohol, or use illegal drugs. Some items may interact with your medicine. What should I watch for while using this medication? Visit your care team regularly. Tell your care team if your symptoms do not start to get better or if they get worse. You may need blood work done while you are taking this medication. You may need to follow a special diet. Talk to your care team. Foods that contain iron include: whole grains/cereals, dried fruits, beans,   or peas, leafy green vegetables, and organ meats (liver, kidney). What side effects may I notice from receiving this medication? Side effects that you should report to your care team as soon as possible: Allergic reactions--skin rash, itching, hives, swelling of the face, lips, tongue, or throat Low blood pressure--dizziness, feeling faint or lightheaded, blurry vision Shortness of  breath Side effects that usually do not require medical attention (report to your care team if they continue or are bothersome): Flushing Headache Joint pain Muscle pain Nausea Pain, redness, or irritation at injection site This list may not describe all possible side effects. Call your doctor for medical advice about side effects. You may report side effects to FDA at 1-800-FDA-1088. Where should I keep my medication? This medication is given in a hospital or clinic and will not be stored at home. NOTE: This sheet is a summary. It may not cover all possible information. If you have questions about this medicine, talk to your doctor, pharmacist, or health care provider.  2023 Elsevier/Gold Standard (2020-12-30 00:00:00)    To help prevent nausea and vomiting after your treatment, we encourage you to take your nausea medication as directed.  BELOW ARE SYMPTOMS THAT SHOULD BE REPORTED IMMEDIATELY: *FEVER GREATER THAN 100.4 F (38 C) OR HIGHER *CHILLS OR SWEATING *NAUSEA AND VOMITING THAT IS NOT CONTROLLED WITH YOUR NAUSEA MEDICATION *UNUSUAL SHORTNESS OF BREATH *UNUSUAL BRUISING OR BLEEDING *URINARY PROBLEMS (pain or burning when urinating, or frequent urination) *BOWEL PROBLEMS (unusual diarrhea, constipation, pain near the anus) TENDERNESS IN MOUTH AND THROAT WITH OR WITHOUT PRESENCE OF ULCERS (sore throat, sores in mouth, or a toothache) UNUSUAL RASH, SWELLING OR PAIN  UNUSUAL VAGINAL DISCHARGE OR ITCHING   Items with * indicate a potential emergency and should be followed up as soon as possible or go to the Emergency Department if any problems should occur.  Please show the CHEMOTHERAPY ALERT CARD or IMMUNOTHERAPY ALERT CARD at check-in to the Emergency Department and triage nurse.  Should you have questions after your visit or need to cancel or reschedule your appointment, please contact [facility] [phone]  and follow the prompts.  Office hours are  8:00 a.m. to 4:30 p.m. Monday - Friday. Please note that voicemails left after 4:00 p.m. may not be returned until the following business day.  We are closed weekends and major holidays. You have access to a nurse at all times for urgent questions. Please call the main number to the clinic [phone] and follow the prompts.  For any non-urgent questions, you may also contact your provider using MyChart. We now offer e-Visits for anyone 18 and older to request care online for non-urgent symptoms. For details visit [email or web address].   Also download the MyChart app! Go to the app store, search "MyChart", open the app, select [facility], and log in with your MyChart username and password.   

## 2023-02-16 ENCOUNTER — Other Ambulatory Visit (HOSPITAL_COMMUNITY): Payer: Self-pay

## 2023-02-18 ENCOUNTER — Encounter: Payer: Self-pay | Admitting: Surgical

## 2023-02-20 ENCOUNTER — Other Ambulatory Visit (HOSPITAL_COMMUNITY): Payer: Self-pay

## 2023-02-21 ENCOUNTER — Inpatient Hospital Stay: Payer: Medicare HMO

## 2023-02-21 ENCOUNTER — Other Ambulatory Visit: Payer: Self-pay

## 2023-02-21 VITALS — BP 91/52 | HR 80 | Temp 98.5°F | Resp 16 | Wt 72.8 lb

## 2023-02-21 DIAGNOSIS — D500 Iron deficiency anemia secondary to blood loss (chronic): Secondary | ICD-10-CM

## 2023-02-21 DIAGNOSIS — E46 Unspecified protein-calorie malnutrition: Secondary | ICD-10-CM

## 2023-02-21 DIAGNOSIS — D509 Iron deficiency anemia, unspecified: Secondary | ICD-10-CM

## 2023-02-21 LAB — OCCULT BLOOD X 1 CARD TO LAB, STOOL
Fecal Occult Bld: POSITIVE — AB
Fecal Occult Bld: POSITIVE — AB
Fecal Occult Bld: POSITIVE — AB

## 2023-02-21 MED ORDER — SODIUM CHLORIDE 0.9 % IV SOLN
300.0000 mg | Freq: Once | INTRAVENOUS | Status: AC
  Administered 2023-02-21: 300 mg via INTRAVENOUS
  Filled 2023-02-21: qty 200

## 2023-02-21 MED ORDER — SODIUM CHLORIDE 0.9 % IV SOLN: Freq: Once | INTRAVENOUS | Status: AC

## 2023-02-21 NOTE — Patient Instructions (Signed)
[facility]  Discharge Instructions: Thank you for choosing [facility] to provide your oncology and hematology care.  If you have a lab appointment with the Cancer Center - please note that after April 8th, 2024, all labs will be drawn in the cancer center.  You do not have to check in or register with the main entrance as you have in the past but will complete your check-in in the cancer center.  Wear comfortable clothing and clothing appropriate for easy access to any Portacath or PICC line.   We strive to give you quality time with your provider. You may need to reschedule your appointment if you arrive late (15 or more minutes).  Arriving late affects you and other patients whose appointments are after yours.  Also, if you miss three or more appointments without notifying the office, you may be dismissed from the clinic at the provider's discretion.      For prescription refill requests, have your pharmacy contact our office and allow 72 hours for refills to be completed.    Iron Sucrose Injection What is this medication? IRON SUCROSE (EYE ern SOO krose) treats low levels of iron (iron deficiency anemia) in people with kidney disease. Iron is a mineral that plays an important role in making red blood cells, which carry oxygen from your lungs to the rest of your body. This medicine may be used for other purposes; ask your health care provider or pharmacist if you have questions. COMMON BRAND NAME(S): Venofer What should I tell my care team before I take this medication? They need to know if you have any of these conditions: Anemia not caused by low iron levels Heart disease High levels of iron in the blood Kidney disease Liver disease An unusual or allergic reaction to iron, other medications, foods, dyes, or preservatives Pregnant or trying to get pregnant Breastfeeding How should I use this medication? This medication is for infusion into a vein. It  is given in a hospital or clinic setting. Talk to your care team about the use of this medication in children. While this medication may be prescribed for children as young as 2 years for selected conditions, precautions do apply. Overdosage: If you think you have taken too much of this medicine contact a poison control center or emergency room at once. NOTE: This medicine is only for you. Do not share this medicine with others. What if I miss a dose? Keep appointments for follow-up doses. It is important not to miss your dose. Call your care team if you are unable to keep an appointment. What may interact with this medication? Do not take this medication with any of the following: Deferoxamine Dimercaprol Other iron products This medication may also interact with the following: Chloramphenicol Deferasirox This list may not describe all possible interactions. Give your health care provider a list of all the medicines, herbs, non-prescription drugs, or dietary supplements you use. Also tell them if you smoke, drink alcohol, or use illegal drugs. Some items may interact with your medicine. What should I watch for while using this medication? Visit your care team regularly. Tell your care team if your symptoms do not start to get better or if they get worse. You may need blood work done while you are taking this medication. You may need to follow a special diet. Talk to your care team. Foods that contain iron include: whole grains/cereals, dried fruits, beans, or peas, leafy green vegetables, and organ meats (  liver, kidney). What side effects may I notice from receiving this medication? Side effects that you should report to your care team as soon as possible: Allergic reactions--skin rash, itching, hives, swelling of the face, lips, tongue, or throat Low blood pressure--dizziness, feeling faint or lightheaded, blurry vision Shortness of breath Side effects that usually do not require medical  attention (report to your care team if they continue or are bothersome): Flushing Headache Joint pain Muscle pain Nausea Pain, redness, or irritation at injection site This list may not describe all possible side effects. Call your doctor for medical advice about side effects. You may report side effects to FDA at 1-800-FDA-1088. Where should I keep my medication? This medication is given in a hospital or clinic and will not be stored at home. NOTE: This sheet is a summary. It may not cover all possible information. If you have questions about this medicine, talk to your doctor, pharmacist, or health care provider.  2023 Elsevier/Gold Standard (2020-12-30 00:00:00)    To help prevent nausea and vomiting after your treatment, we encourage you to take your nausea medication as directed.  BELOW ARE SYMPTOMS THAT SHOULD BE REPORTED IMMEDIATELY: *FEVER GREATER THAN 100.4 F (38 C) OR HIGHER *CHILLS OR SWEATING *NAUSEA AND VOMITING THAT IS NOT CONTROLLED WITH YOUR NAUSEA MEDICATION *UNUSUAL SHORTNESS OF BREATH *UNUSUAL BRUISING OR BLEEDING *URINARY PROBLEMS (pain or burning when urinating, or frequent urination) *BOWEL PROBLEMS (unusual diarrhea, constipation, pain near the anus) TENDERNESS IN MOUTH AND THROAT WITH OR WITHOUT PRESENCE OF ULCERS (sore throat, sores in mouth, or a toothache) UNUSUAL RASH, SWELLING OR PAIN  UNUSUAL VAGINAL DISCHARGE OR ITCHING   Items with * indicate a potential emergency and should be followed up as soon as possible or go to the Emergency Department if any problems should occur.  Please show the CHEMOTHERAPY ALERT CARD or IMMUNOTHERAPY ALERT CARD at check-in to the Emergency Department and triage nurse.  Should you have questions after your visit or need to cancel or reschedule your appointment, please contact [facility] [phone]  and follow the prompts.  Office hours are 8:00 a.m. to 4:30 p.m. Monday - Friday. Please note that  voicemails left after 4:00 p.m. may not be returned until the following business day.  We are closed weekends and major holidays. You have access to a nurse at all times for urgent questions. Please call the main number to the clinic [phone] and follow the prompts.  For any non-urgent questions, you may also contact your provider using MyChart. We now offer e-Visits for anyone 18 and older to request care online for non-urgent symptoms. For details visit [email or web address].   Also download the MyChart app! Go to the app store, search "MyChart", open the app, select [facility], and log in with your MyChart username and password.   

## 2023-02-21 NOTE — Progress Notes (Signed)
Patient presents today for iron infusion.  Patient is in satisfactory condition with no new complaints voiced.  Patient had Tylenol and Benadryl this morning at 0940 prior to visit.  We will hold pre-medications today.  IV placed in R forearm.  IV flushed well with good blood return noted.  Vital signs are stable.  We will proceed with infusion per provider orders.    Patient tolerated iron infusion well with no complaints voiced.  Patient did not wait the entire recommended 30 minutes post iron wait time.  Patient stayed about 20 minutes.  Patient left via wheelchair with aid in stable condition.  Vital signs stable at discharge.  Follow up as scheduled.

## 2023-02-23 ENCOUNTER — Other Ambulatory Visit (INDEPENDENT_AMBULATORY_CARE_PROVIDER_SITE_OTHER): Payer: Medicare HMO

## 2023-02-23 ENCOUNTER — Ambulatory Visit (INDEPENDENT_AMBULATORY_CARE_PROVIDER_SITE_OTHER): Payer: Medicare HMO | Admitting: Surgical

## 2023-02-23 DIAGNOSIS — M545 Low back pain, unspecified: Secondary | ICD-10-CM

## 2023-02-25 ENCOUNTER — Encounter: Payer: Self-pay | Admitting: Surgical

## 2023-02-25 NOTE — Progress Notes (Signed)
Follow-up Office Visit Note   Patient: Vanessa Lara           Date of Birth: [birth year]           MRN: [id number] Visit Date: 02/23/2023 Requested by: [name], NP [address] [place],  [place] [address] PCP: [name], NP  Subjective: Chief Complaint  Patient presents with   Left Hand - Pain   Lower Back - Pain    HPI: Vanessa Lara is a 67 y.o. female who returns to the office for follow-up visit.    Plan at last visit was: Patient is a 67 year old female who presents for evaluation of left wrist pain. She has about 3 weeks of increased wrist pain without any history of injury. She has radiographs taken today demonstrating moderate degenerative changes throughout the radiocarpal joint with large cyst formation in the lunate. She has history of rheumatoid arthritis. We discussed options available to patient such as further evaluation with MRI scan versus cortisone injection to see if we can help with her pain. Patient would like to proceed with cortisone injection. Under ultrasound guidance, cortisone injection successfully delivered into the wrist joint with reevaluation 5 to 10 minutes later and patient noting good relief of her symptoms. We will see how she does in about 1 to 2 weeks and she will call to let us know. If no improvement or if little improvement, neck step would be MRI scan. Patient agreed with plan.   Since then, patient notes she had good relief of her left wrist pain for several weeks before pain returned.  With the return of her pain, she also complains of tingling in the fourth and fifth fingers of the left hand as well as pain throughout the MCP joints of her left hand.  She states she has appointment with Dr. Dimple Casey next week to discuss medication changes for her RA.  She also has increased pain in the thoracic spine primarily in the midline thoracic spine as well as the right-sided paraspinal region just below the level of the scapula.  She  complained of this at her last appointment  as well.  No radicular pain around her chest wall.  No rashes noted.              ROS: All systems reviewed are negative as they relate to the chief complaint within the history of present illness.  Patient denies fevers or chills.  Assessment & Plan: Visit Diagnoses:  1. Midline low back pain, unspecified chronicity, unspecified whether sciatica present     Plan: Vanessa Lara is a 67 y.o. female who returns to the office for follow-up visit for left hand pain and thoracic spine pain.  Plan from last visit was noted above in HPI.  They now return with short-lived relief from wrist injection.  With return of symptoms, plan for further evaluation of lunate cyst formation with MRI of the left hand to include the wrist.  She also has continued pain in the thoracic spine with radiographs today demonstrating what looks like a possible compression fracture in one of the vertebrae in the upper thoracic spine with little bit of change in morphology of the vertebra compared with the other levels.  She does have a history of osteoporosis in the lumbar spine with T-score of -4.2 in 2021.  She has had prior insufficiency fractures.  With her continued pain this is worth investigating.  Ordered MRI of the thoracic  spine and follow-up after both MRI scans.  Follow-Up Instructions: No follow-ups on file.   Orders:  Orders Placed This Encounter  Procedures   XR Thoracic Spine 2 View   MR HAND LEFT WO CONTRAST   MR Thoracic Spine w/o contrast   No orders of the defined types were placed in this encounter.     Procedures: No procedures performed   Clinical Data: No additional findings.  Objective: Vital Signs: There were no vitals taken for this visit.  Physical Exam:  Constitutional: Patient appears well-developed HEENT:  Head: Normocephalic Eyes:EOM are normal Neck: Normal range of motion Cardiovascular: Normal rate Pulmonary/chest: Effort  normal Neurologic: Patient is alert Skin: Skin is warm Psychiatric: Patient has normal mood and affect  Ortho Exam: Ortho exam demonstrates left hand with bogginess and tenderness around the MCP joints.  She has 2+ radial pulse of the left upper extremity.  Intact EPL, FPL, finger abduction, grip strength testing.  Able to make full composite fist.  This is uncomfortable for her.  She has tenderness diffusely throughout the left wrist.  She has mild tingling noted in the fourth and fifth fingers with Tinel sign over the ulnar nerve at the elbow.  Thoracic spine with tenderness at multiple levels of the thoracic spine in the axial spine moderately.  1 focal spot of more severe tenderness around the level of the inferior pole of the scapula.  No deformity noted.  No swelling or rashes noted.  Specialty Comments:  MRI CERVICAL SPINE WITHOUT CONTRAST   TECHNIQUE: Multiplanar, multisequence MR imaging of the cervical spine was performed. No intravenous contrast was administered.   COMPARISON:  None.   FINDINGS: Alignment: 2 mm retrolisthesis of C5 on C6.   Vertebrae: No acute fracture, evidence of discitis, or bone lesion.   Cord: Normal signal and morphology.   Posterior Fossa, vertebral arteries, paraspinal tissues: Posterior fossa demonstrates no focal abnormality. Vertebral artery flow voids are maintained. Paraspinal soft tissues are unremarkable.   Disc levels:   Discs: Degenerative disease with disc height loss at C4-5 and C5-6.   C2-3: No significant disc bulge. No neural foraminal stenosis. No central canal stenosis.   C3-4: Mild broad-based disc bulge. No foraminal or central canal stenosis.   C4-5: Mild broad-based disc bulge. No foraminal or central canal stenosis.   C5-6: Mild broad-based disc bulge. Bilateral uncovertebral degenerative changes. Moderate left and severe right foraminal stenosis. No spinal stenosis.   C6-7: Mild broad-based disc bulge. No foraminal  or central canal stenosis.   C7-T1: No significant disc bulge. No neural foraminal stenosis. No central canal stenosis.   IMPRESSION: 1. At C5-6 there is a mild broad-based disc bulge. Bilateral uncovertebral degenerative changes. Moderate left and severe right foraminal stenosis. 2. Otherwise, mild cervical spine spondylosis as described above.     Electronically Signed   By: Elige Ko M.D.   On: 12/16/2021 10:51  Imaging: No results found.   PMFS History: Patient Active Problem List   Diagnosis Date Noted   High risk medication use 01/16/2023   Medication monitoring encounter 12/05/2021   Physical deconditioning 11/11/2021   Elevated serum lactate dehydrogenase (LDH) 08/17/2021   Vitamin D deficiency 08/17/2021   Chronic fatigue 07/25/2021   Vitamin B12 deficiency 07/19/2021   Hardening of the aorta (main artery of the heart) (HCC) 06/21/2021   Solitary lung nodule 06/15/2021   Loss of weight 05/18/2021   Protein-calorie malnutrition, severe (HCC) 05/18/2021   Osteoporosis 06/15/2020   Iron deficiency anemia  04/09/2020   Primary osteoarthritis involving multiple joints 12/31/2018   Rheumatoid arthritis involving multiple sites with positive rheumatoid factor (HCC) 12/31/2018   Irritable bowel syndrome with both constipation and diarrhea 10/28/2018   Constipation 03/14/2016   Mitral valve prolapse 11/23/2015   High cholesterol 11/23/2015   Past Medical History:  Diagnosis Date   Anemia    Anxiety    Arthritis    RA   Cancer (HCC)    Skin   CHF (congestive heart failure) (HCC)    Collagen vascular disease (HCC)    Depression    Endometriosis    High cholesterol    IBS (irritable bowel syndrome)    Lymphadenopathy    Mitral valve prolapse    RA (rheumatoid arthritis) (HCC)    Rotator cuff tear    bilateral   Stroke (HCC) 06/2022   Uterus, adenomyosis     Family History  Problem Relation Age of Onset   Colon cancer Other    Bone cancer Mother     Brain cancer Father    Brain cancer Sister    Breast cancer Sister    Renal Disease Sister    Pancreatic cancer Neg Hx    Esophageal cancer Neg Hx    Stomach cancer Neg Hx    Rectal cancer Neg Hx     Past Surgical History:  Procedure Laterality Date   AXILLARY LYMPH NODE BIOPSY Right 10/12/2020   Procedure: RIGHT AXILLARY LYMPH NODE BIOPSY EXCISION;  Surgeon: Almond Lint, MD;  Location: [facility];  Service: General;  Laterality: Right;   BUNIONECTOMY Right    CARPAL TUNNEL RELEASE Right    COLONOSCOPY     COLONOSCOPY N/A 12/13/2015   Procedure: COLONOSCOPY;  Surgeon: Malissa Hippo, MD;  Location: [facility];  Service: Endoscopy;  Laterality: N/A;  9:55   Fatty tumor     2011 (left arm)   FOOT SURGERY     hammertoe   TONSILLECTOMY     TOTAL ABDOMINAL HYSTERECTOMY     2003   Social History   Occupational History   Occupation: retired  Tobacco Use   Smoking status: Some Days    Packs/day: 0.50    Years: 33.00    Additional pack years: 0.00    Total pack years: 16.50    Types: Cigarettes    Last attempt to quit: 2013    Years since quitting: 11.4   Smokeless tobacco: Never  Vaping Use   Vaping Use: Never used  Substance and Sexual Activity   Alcohol use: No    Alcohol/week: 0.0 standard drinks of alcohol   Drug use: No   Sexual activity: Not Currently

## 2023-02-27 ENCOUNTER — Other Ambulatory Visit (HOSPITAL_COMMUNITY): Payer: Self-pay

## 2023-02-27 ENCOUNTER — Telehealth: Payer: Self-pay | Admitting: Surgical

## 2023-02-27 NOTE — Telephone Encounter (Signed)
Pt called stating her back pains are unbearably. Pt states she took all her medication and none is working and asking for a call back. Please call Pt at [phone].

## 2023-02-28 ENCOUNTER — Inpatient Hospital Stay: Payer: Medicare HMO

## 2023-02-28 ENCOUNTER — Telehealth: Payer: Self-pay

## 2023-02-28 ENCOUNTER — Encounter (INDEPENDENT_AMBULATORY_CARE_PROVIDER_SITE_OTHER): Payer: Self-pay | Admitting: *Deleted

## 2023-02-28 VITALS — BP 90/54 | HR 85 | Temp 97.9°F | Resp 18

## 2023-02-28 DIAGNOSIS — D500 Iron deficiency anemia secondary to blood loss (chronic): Secondary | ICD-10-CM

## 2023-02-28 DIAGNOSIS — D509 Iron deficiency anemia, unspecified: Secondary | ICD-10-CM | POA: Diagnosis not present

## 2023-02-28 MED ORDER — SODIUM CHLORIDE 0.9 % IV SOLN
300.0000 mg | Freq: Once | INTRAVENOUS | Status: AC
  Administered 2023-02-28: 300 mg via INTRAVENOUS
  Filled 2023-02-28: qty 15

## 2023-02-28 MED ORDER — CETIRIZINE HCL 10 MG PO TABS: 10.0000 mg | ORAL_TABLET | Freq: Once | ORAL | Status: DC

## 2023-02-28 MED ORDER — SODIUM CHLORIDE 0.9 % IV SOLN: Freq: Once | INTRAVENOUS | Status: AC

## 2023-02-28 MED ORDER — ACETAMINOPHEN 325 MG PO TABS: 650.0000 mg | ORAL_TABLET | Freq: Once | ORAL | Status: DC

## 2023-02-28 NOTE — Telephone Encounter (Signed)
Vanessa Lara wanting wrist included in left hand MRI ordered.

## 2023-02-28 NOTE — Patient Instructions (Signed)
[facility] [place]  Discharge Instructions: Thank you for choosing [facility] to provide your oncology and hematology care.  If you have a lab appointment with the Cancer Center - please note that after April 8th, 2024, all labs will be drawn in the cancer center.  You do not have to check in or register with the main entrance as you have in the past but will complete your check-in in the cancer center.  Wear comfortable clothing and clothing appropriate for easy access to any Portacath or PICC line.   We strive to give you quality time with your provider. You may need to reschedule your appointment if you arrive late (15 or more minutes).  Arriving late affects you and other patients whose appointments are after yours.  Also, if you miss three or more appointments without notifying the office, you may be dismissed from the clinic at the provider's discretion.      For prescription refill requests, have your pharmacy contact our office and allow 72 hours for refills to be completed.    Today you received Venofer IV iron infusion.  BELOW ARE SYMPTOMS THAT SHOULD BE REPORTED IMMEDIATELY: *FEVER GREATER THAN 100.4 F (38 C) OR HIGHER *CHILLS OR SWEATING *NAUSEA AND VOMITING THAT IS NOT CONTROLLED WITH YOUR NAUSEA MEDICATION *UNUSUAL SHORTNESS OF BREATH *UNUSUAL BRUISING OR BLEEDING *URINARY PROBLEMS (pain or burning when urinating, or frequent urination) *BOWEL PROBLEMS (unusual diarrhea, constipation, pain near the anus) TENDERNESS IN MOUTH AND THROAT WITH OR WITHOUT PRESENCE OF ULCERS (sore throat, sores in mouth, or a toothache) UNUSUAL RASH, SWELLING OR PAIN  UNUSUAL VAGINAL DISCHARGE OR ITCHING   Items with * indicate a potential emergency and should be followed up as soon as possible or go to the Emergency Department if any problems should occur.  Please show the CHEMOTHERAPY ALERT CARD or IMMUNOTHERAPY ALERT CARD at check-in to the Emergency Department and  triage nurse.  Should you have questions after your visit or need to cancel or reschedule your appointment, please contact [facility] [phone]  and follow the prompts.  Office hours are 8:00 a.m. to 4:30 p.m. Monday - Friday. Please note that voicemails left after 4:00 p.m. may not be returned until the following business day.  We are closed weekends and major holidays. You have access to a nurse at all times for urgent questions. Please call the main number to the clinic [phone] and follow the prompts.  For any non-urgent questions, you may also contact your provider using MyChart. We now offer e-Visits for anyone 18 and older to request care online for non-urgent symptoms. For details visit [email or web address].   Also download the MyChart app! Go to the app store, search "MyChart", open the app, select [facility], and log in with your MyChart username and password.   

## 2023-02-28 NOTE — Progress Notes (Signed)
Patient presents today for iron infusion. Patient is in satisfactory condition with no new complaints voiced.  Vital signs are stable.  We will proceed with infusion per provider orders.    Patient took pre-meds Tylenol and Zyrtec at home prior to arrival. Peripheral IV started with good blood return pre and post infusion.  Venofer 300 mg given today per MD orders. Tolerated infusion without adverse affects. Vital signs stable. No complaints at this time. Discharged from clinic wheelchair in stable condition. Alert and oriented x 3. F/U with [facility] as scheduled.

## 2023-03-01 ENCOUNTER — Other Ambulatory Visit: Payer: Self-pay | Admitting: Surgical

## 2023-03-01 ENCOUNTER — Telehealth: Payer: Self-pay | Admitting: Surgical

## 2023-03-01 MED ORDER — TRAMADOL HCL 50 MG PO TABS: 50.0000 mg | ORAL_TABLET | Freq: Two times a day (BID) | ORAL | 0 refills | Status: DC | PRN

## 2023-03-01 NOTE — Telephone Encounter (Signed)
Sent in rx for tramadol as temporary RX until MRI gets done

## 2023-03-01 NOTE — Telephone Encounter (Signed)
Patient want to speak to someone about her pain and wants medication. Pleaske advise

## 2023-03-02 NOTE — Telephone Encounter (Signed)
done

## 2023-03-02 NOTE — Telephone Encounter (Signed)
IC advised.  

## 2023-03-06 ENCOUNTER — Ambulatory Visit: Payer: Medicare HMO | Admitting: Internal Medicine

## 2023-03-06 ENCOUNTER — Other Ambulatory Visit: Payer: Self-pay

## 2023-03-06 DIAGNOSIS — D500 Iron deficiency anemia secondary to blood loss (chronic): Secondary | ICD-10-CM

## 2023-03-06 NOTE — Progress Notes (Deleted)
Office Visit Note  Patient: Vanessa Lara             Date of Birth: May 28, 1956           MRN: [id number]             PCP: [name], NP Referring: [name], NP Visit Date: 03/06/2023   Subjective:  No chief complaint on file.   History of Present Illness: Vanessa Lara is a 67 y.o. female here for follow up ***   Previous HPI 01/16/23 Vanessa Lara is a 67 y.o. female here for follow up for seropositive RA.  Currently health maintenance treatment after discontinuing 5 mg prednisone is on medication for joint pain for RA along with osteoarthritis and chronic pain syndrome.  Had recent knee steroid injection since her last visit but reports overall widespread symptoms are severe.  Particularly is been noticing some increased trouble with pain and some swelling involving the wrist worse on left side.   Previous HPI 10/03/22 Vanessa Lara is a 67 y.o. female here for follow up for seropositive RA currently off long term DMARD with prednisone 5 mg daily as needed.  She is overall not doing well feels like pain and stiffness is a big problem but more severe is her fatigue and feels a generalized getting weaker with difficulty moving and with her upper body strength.  She pretty consistently has to take the prednisone for otherwise does not move around much during the day.  Continues to get a benefit with the Flexeril at nighttime with slight decrease in frequency of awakenings.  She is also concerned about getting an update to her bone density testing ongoing oral alendronate treatment.  She is also still having a lot of pain and difficulty with walking from her claw toe deformities on the foot and planning to follow-up with surgery clinic about management of this.   Previous HPI 06/28/22 Vanessa Lara is a 67 y.o. female here for follow up for seropositive RA now off of treatment after stopping the [place].  Since she stopped taking the medication her complaint of night  sweats and fatigue have improved. She has seen a worsening of joint pain in several areas particularly around the MCP joints of both hands. For knee pain she saw orthopedic surgery clinic yesterday and had injections on both sides.  She also discussed getting set up to do right SI joint steroid injection.  Biggest complaint today is back pain on the right side around the middle of the back and around the scapula.  This bothers her especially with certain movements and trying to bend side to side.    Previous HPI 05/18/2022  Vanessa Lara is a 68 y.o. female here for follow up for seropositive rheumatoid arthritis on Kevzara 150 mg [place] q14days. She feels arthritis has been fairly well controlled but has pain worse in her left foot mostly. She had GI issues suspected UTI but with negative culture since last visit. She feels a sensation "like her brain is numb." Also has ongoing upper neck pain without radiating symptoms.    Previous HPI 02/07/2022 Vanessa Lara is a 67 y.o. female here for follow up for rheumatoid arthritis with chronic joint pain in multiple areas and chronic fatigue labs showing elevated rheumatoid factor. Kevzara new start on 12/15/2021. She felt an improvement when taking the injections within first weeks. Accidentally took the shots twice in a week due to forgetfulness or mistake but did  not notice any major problems. She called in on account of increased joint pain but felt no improvement with the prescribed medrol dose pack. She did feel more lightheadedness or off balance feeling while taking this so stopped after about 3 days. Currently back pain and her left foot pain are worst issues.   Previous HPI 12/05/2021 Vanessa Lara is a 67 y.o. female here for evaluation of rheumatoid arthritis with chronic joint pain in multiple areas and chronic fatigue labs showing elevated rheumatoid factor. She has history of mitral valve prolapse, severe osteoporosis, osteoarthritis, and severe weight  loss and deconditioning. She was diagnosed with seropositive RA in 2018 with Dr. Deanne Coffer and most recently saw Dr. Dierdre Forth in 11/2020. At that time recommended to start hydroxychloroquine for inflammatory arthritis symptoms. She previously tried methotrexate, simponi aria, and orencia with poor tolerance. Imaging consistent with degenerative arthritis in cervical spine and knee but also with some partial tendon tear in shoulder and some synovitis present in knee on MRI imaging. She has taken multiple medications for pain including NSAIDs, prednisone, gabapentin, tramadol, and hydrocodone or oxycodone. She has a lot of pain in multiple areas but is also severely fatigued. She has significant weight loss down to 70 lbs unintentionally reports chronic IBS issues but no recent changes during this period of weight loss. Joint pain also limits her activity quite a bit with chronic deformities and also ongoing swelling in bilateral hands. She has also developed left sided headache localized above the temporal area. She has a lot of pain at the base of the skull and occiput without clear radiation of symptoms. She is scheduled to see Dr. Delena Bali for headache evaluation and management 3/28.   DMARD Hx MTX Simponi Orencia Kevzara     05/2017 RF 15 CCP 32 HBV neg HCV neg   No Rheumatology ROS completed.   PMFS History:  Patient Active Problem List   Diagnosis Date Noted   High risk medication use 01/16/2023   Medication monitoring encounter 12/05/2021   Physical deconditioning 11/11/2021   Elevated serum lactate dehydrogenase (LDH) 08/17/2021   Vitamin D deficiency 08/17/2021   Chronic fatigue 07/25/2021   Vitamin B12 deficiency 07/19/2021   Hardening of the aorta (main artery of the heart) (HCC) 06/21/2021   Solitary lung nodule 06/15/2021   Loss of weight 05/18/2021   Protein-calorie malnutrition, severe (HCC) 05/18/2021   Osteoporosis 06/15/2020   Iron deficiency anemia 04/09/2020   Primary  osteoarthritis involving multiple joints 12/31/2018   Rheumatoid arthritis involving multiple sites with positive rheumatoid factor (HCC) 12/31/2018   Irritable bowel syndrome with both constipation and diarrhea 10/28/2018   Constipation 03/14/2016   Mitral valve prolapse 11/23/2015   High cholesterol 11/23/2015    Past Medical History:  Diagnosis Date   Anemia    Anxiety    Arthritis    RA   Cancer (HCC)    Skin   CHF (congestive heart failure) (HCC)    Collagen vascular disease (HCC)    Depression    Endometriosis    High cholesterol    IBS (irritable bowel syndrome)    Lymphadenopathy    Mitral valve prolapse    RA (rheumatoid arthritis) (HCC)    Rotator cuff tear    bilateral   Stroke (HCC) 06/2022   Uterus, adenomyosis     Family History  Problem Relation Age of Onset   Colon cancer Other    Bone cancer Mother    Brain cancer Father    Brain cancer  Sister    Breast cancer Sister    Renal Disease Sister    Pancreatic cancer Neg Hx    Esophageal cancer Neg Hx    Stomach cancer Neg Hx    Rectal cancer Neg Hx    Past Surgical History:  Procedure Laterality Date   AXILLARY LYMPH NODE BIOPSY Right 10/12/2020   Procedure: RIGHT AXILLARY LYMPH NODE BIOPSY EXCISION;  Surgeon: Almond Lint, MD;  Location: [facility];  Service: General;  Laterality: Right;   BUNIONECTOMY Right    CARPAL TUNNEL RELEASE Right    COLONOSCOPY     COLONOSCOPY N/A 12/13/2015   Procedure: COLONOSCOPY;  Surgeon: Malissa Hippo, MD;  [facility];  Service: Endoscopy;  Laterality: N/A;  9:55   Fatty tumor     2011 (left arm)   FOOT SURGERY     hammertoe   TONSILLECTOMY     TOTAL ABDOMINAL HYSTERECTOMY     2003   Social History   Social History Narrative   Not on file   Immunization History  Administered Date(s) Administered   Influenza, Quadrivalent, Recombinant, Inj, Pf 07/11/2017     Objective: Vital Signs: There were no vitals taken for this visit.    Physical Exam   Musculoskeletal Exam: ***  CDAI Exam: CDAI Score: -- Patient Global: --; Provider Global: -- Swollen: --; Tender: -- Joint Exam 03/06/2023   No joint exam has been documented for this visit   There is currently no information documented on the homunculus. Go to the Rheumatology activity and complete the homunculus joint exam.  Investigation: No additional findings.  Imaging: XR Thoracic Spine 2 View  Result Date: 02/25/2023 AP and lateral views of the thoracic spine reviewed.  No spondylolisthesis.  Possible compression fracture of one of the vertebrae in the upper thoracic spine.  Mild spinal asymmetry but no scoliosis.  XR Wrist Complete Left  Result Date: 02/06/2023 PA, oblique, lateral views of left wrist reviewed.  Moderate to severe degenerative changes of the radiocarpal joint with cystic formation in the lunate.  No fracture or dislocation.  US Guided Needle Placement - No Linked Charges  Result Date: 02/06/2023 Ultrasound imaging demonstrates needle placement into the radiocarpal joint with extravasation of fluid noted at the very end of the video.   Recent Labs: Lab Results  Component Value Date   WBC 9.6 02/02/2023   HGB 9.8 (L) 02/02/2023   PLT 372 02/02/2023   NA 133 (L) 02/02/2023   K 3.2 (L) 02/02/2023   CL 97 (L) 02/02/2023   CO2 24 02/02/2023   GLUCOSE 96 02/02/2023   BUN 8 02/02/2023   CREATININE 0.59 02/02/2023   BILITOT 0.2 (L) 02/02/2023   ALKPHOS 127 (H) 02/02/2023   AST 19 02/02/2023   ALT 10 02/02/2023   PROT 7.2 02/02/2023   ALBUMIN 3.4 (L) 02/02/2023   CALCIUM 8.8 (L) 02/02/2023   GFRAA 92 11/06/2019   QFTBGOLDPLUS NEGATIVE 12/05/2021    Speciality Comments: No specialty comments available.  Procedures:  No procedures performed Allergies: Abatacept, Codeine, Golimumab, Hydrocodone, Methylprednisolone, Methotrexate derivatives, and Prednisone   Assessment / Plan:     Visit Diagnoses: No diagnosis  found.  ***  Orders: No orders of the defined types were placed in this encounter.  No orders of the defined types were placed in this encounter.    Follow-Up Instructions: No follow-ups on file.   Fuller Plan, MD  Note - This record has been created using [employer].  Chart creation errors have been sought, but may not always  have been located. Such creation errors do not reflect on  the standard of medical care.

## 2023-03-07 ENCOUNTER — Inpatient Hospital Stay: Payer: Medicare HMO

## 2023-03-07 ENCOUNTER — Telehealth: Payer: Self-pay | Admitting: Psychiatry

## 2023-03-07 ENCOUNTER — Telehealth: Payer: Self-pay | Admitting: Orthopedic Surgery

## 2023-03-07 ENCOUNTER — Inpatient Hospital Stay: Payer: Medicare HMO | Attending: Hematology

## 2023-03-07 ENCOUNTER — Telehealth: Payer: Self-pay | Admitting: Gastroenterology

## 2023-03-07 ENCOUNTER — Other Ambulatory Visit: Payer: Self-pay | Admitting: Surgical

## 2023-03-07 DIAGNOSIS — D509 Iron deficiency anemia, unspecified: Secondary | ICD-10-CM | POA: Insufficient documentation

## 2023-03-07 DIAGNOSIS — D500 Iron deficiency anemia secondary to blood loss (chronic): Secondary | ICD-10-CM

## 2023-03-07 LAB — CBC WITH DIFFERENTIAL/PLATELET
Abs Immature Granulocytes: 0.03 10*3/uL (ref 0.00–0.07)
Basophils Absolute: 0 10*3/uL (ref 0.0–0.1)
Basophils Relative: 1 %
Eosinophils Absolute: 0.1 10*3/uL (ref 0.0–0.5)
Eosinophils Relative: 1 %
HCT: 36.4 % (ref 36.0–46.0)
Hemoglobin: 11.7 g/dL — ABNORMAL LOW (ref 12.0–15.0)
Immature Granulocytes: 0 %
Lymphocytes Relative: 18 %
Lymphs Abs: 1.5 10*3/uL (ref 0.7–4.0)
MCH: 27.7 pg (ref 26.0–34.0)
MCHC: 32.1 g/dL (ref 30.0–36.0)
MCV: 86.3 fL (ref 80.0–100.0)
Monocytes Absolute: 0.3 10*3/uL (ref 0.1–1.0)
Monocytes Relative: 4 %
Neutro Abs: 6.4 10*3/uL (ref 1.7–7.7)
Neutrophils Relative %: 76 %
Platelets: 363 10*3/uL (ref 150–400)
RBC: 4.22 MIL/uL (ref 3.87–5.11)
RDW: 19.4 % — ABNORMAL HIGH (ref 11.5–15.5)
WBC: 8.4 10*3/uL (ref 4.0–10.5)
nRBC: 0 % (ref 0.0–0.2)

## 2023-03-07 LAB — SAMPLE TO BLOOD BANK

## 2023-03-07 MED ORDER — DOCUSATE SODIUM 100 MG PO CAPS: 100.0000 mg | ORAL_CAPSULE | Freq: Two times a day (BID) | ORAL | 2 refills | Status: DC

## 2023-03-07 MED ORDER — ACETAMINOPHEN-CODEINE 300-30 MG PO TABS: 1.0000 | ORAL_TABLET | Freq: Every day | ORAL | 0 refills | Status: DC | PRN

## 2023-03-07 NOTE — Telephone Encounter (Signed)
Called pt and let her know that her Naratriptan isn't used to treat nerve pain in her back, it is strictly for the treatment of migraines and headaches. Pt then stated that she wanted a nerve block injection in her back because she is having back pain for the past 3 months, asked pt has she seen anyone for this back pain. Pt states that she has seen her PCP for her back pain and they scheduled her an MRI for 03/17/2023. Pt doesn't want until her appointment (04/24/2023) nor for her MRI to get the nerve block shot in her back. Told pt we will let the [facility] know and get back in contact with her. Pt verbalized understanding.

## 2023-03-07 NOTE — Telephone Encounter (Signed)
Patient called advised the Tramadol is not working and asked if there is something else she can take. Patient asked what stool softener she can get that would help?  Patient said she was so constipated. The number to contact patient is [phone]

## 2023-03-07 NOTE — Telephone Encounter (Signed)
We don't perform nerve blocks in the low back. She will have to wait to see what the MRi shows on the 15th and we can refer her for injections or other based on results. We have to see what Is going on in the back before we do anything. I can increase her Lyrica for pain if she likes from 25mg  bid to 50 mg bid and see if that helps?

## 2023-03-07 NOTE — Telephone Encounter (Signed)
I sent in a prescription for Tylenol with codeine and Colace.  Discontinue the tramadol.  If she feels like the Colace is not helpful after a day or 2, I would take 1 dose of MiraLAX per day until that helps.

## 2023-03-07 NOTE — Progress Notes (Signed)
Office Visit Note  Patient: Vanessa Lara             Date of Birth: 1956/07/14           MRN: [id number]             PCP: [name], NP Referring: [name], NP Visit Date: 03/08/2023   Subjective:  Follow-up (Patient states she can not take the sulfasalazine because it burns her stomach. Patient states she has had labs done recently.)   History of Present Illness: Vanessa Lara is a 67 y.o. female here for follow up for seropositive RA.  Currently off any disease specific medication she cannot tolerate taking the sulfasalazine due to severe stomach irritation described as burning up her stomach.  Continues having joint pain and stiffness on a daily basis overall feels her function is very poor due to the severity.  Had labs last month still anemic at hemoglobin of 9.8 being treated with infusion for iron deficiency anemia.  Previous HPI 01/16/23 Vanessa Lara is a 67 y.o. female here for follow up for seropositive RA.  Currently health maintenance treatment after discontinuing 5 mg prednisone is on medication for joint pain for RA along with osteoarthritis and chronic pain syndrome.  Had recent knee steroid injection since her last visit but reports overall widespread symptoms are severe.  Particularly is been noticing some increased trouble with pain and some swelling involving the wrist worse on left side.   Previous HPI 10/03/22 Vanessa Lara is a 67 y.o. female here for follow up for seropositive RA currently off long term DMARD with prednisone 5 mg daily as needed.  She is overall not doing well feels like pain and stiffness is a big problem but more severe is her fatigue and feels a generalized getting weaker with difficulty moving and with her upper body strength.  She pretty consistently has to take the prednisone for otherwise does not move around much during the day.  Continues to get a benefit with the Flexeril at nighttime with slight decrease in frequency of  awakenings.  She is also concerned about getting an update to her bone density testing ongoing oral alendronate treatment.  She is also still having a lot of pain and difficulty with walking from her claw toe deformities on the foot and planning to follow-up with surgery clinic about management of this.   Previous HPI 06/28/22 Vanessa Lara is a 67 y.o. female here for follow up for seropositive RA now off of treatment after stopping the [place].  Since she stopped taking the medication her complaint of night sweats and fatigue have improved. She has seen a worsening of joint pain in several areas particularly around the MCP joints of both hands. For knee pain she saw orthopedic surgery clinic yesterday and had injections on both sides.  She also discussed getting set up to do right SI joint steroid injection.  Biggest complaint today is back pain on the right side around the middle of the back and around the scapula.  This bothers her especially with certain movements and trying to bend side to side.    Previous HPI 05/18/2022  Vanessa Lara is a 67 y.o. female here for follow up for seropositive rheumatoid arthritis on Kevzara 150 mg Earlimart q14days. She feels arthritis has been fairly well controlled but has pain worse in her left foot mostly. She had GI issues suspected UTI but with negative culture since last visit. She feels  a sensation "like her brain is numb." Also has ongoing upper neck pain without radiating symptoms.    Previous HPI 02/07/2022 Vanessa Lara is a 67 y.o. female here for follow up for rheumatoid arthritis with chronic joint pain in multiple areas and chronic fatigue labs showing elevated rheumatoid factor. Kevzara new start on 12/15/2021. She felt an improvement when taking the injections within first weeks. Accidentally took the shots twice in a week due to forgetfulness or mistake but did not notice any major problems. She called in on account of increased joint pain but felt no  improvement with the prescribed medrol dose pack. She did feel more lightheadedness or off balance feeling while taking this so stopped after about 3 days. Currently back pain and her left foot pain are worst issues.   Previous HPI 12/05/2021 Vanessa Lara is a 67 y.o. female here for evaluation of rheumatoid arthritis with chronic joint pain in multiple areas and chronic fatigue labs showing elevated rheumatoid factor. She has history of mitral valve prolapse, severe osteoporosis, osteoarthritis, and severe weight loss and deconditioning. She was diagnosed with seropositive RA in 2018 with Dr. Deanne Coffer and most recently saw Dr. Dierdre Forth in 11/2020. At that time recommended to start hydroxychloroquine for inflammatory arthritis symptoms. She previously tried methotrexate, simponi aria, and orencia with poor tolerance. Imaging consistent with degenerative arthritis in cervical spine and knee but also with some partial tendon tear in shoulder and some synovitis present in knee on MRI imaging. She has taken multiple medications for pain including NSAIDs, prednisone, gabapentin, tramadol, and hydrocodone or oxycodone. She has a lot of pain in multiple areas but is also severely fatigued. She has significant weight loss down to 70 lbs unintentionally reports chronic IBS issues but no recent changes during this period of weight loss. Joint pain also limits her activity quite a bit with chronic deformities and also ongoing swelling in bilateral hands. She has also developed left sided headache localized above the temporal area. She has a lot of pain at the base of the skull and occiput without clear radiation of symptoms. She is scheduled to see Dr. Delena Bali for headache evaluation and management 3/28.   DMARD Hx MTX Simponi Orencia Kevzara   05/2017 RF 15 CCP 32 HBV neg HCV neg   Review of Systems  Constitutional:  Positive for fatigue.  HENT:  Positive for mouth dryness. Negative for mouth sores.   Eyes:   Positive for dryness.  Respiratory:  Negative for shortness of breath.   Cardiovascular:  Positive for palpitations. Negative for chest pain.  Gastrointestinal:  Positive for blood in stool, constipation and diarrhea.  Endocrine: Positive for increased urination.  Genitourinary:  Positive for involuntary urination.  Musculoskeletal:  Positive for joint pain, gait problem, joint pain, joint swelling, myalgias, muscle weakness, morning stiffness, muscle tenderness and myalgias.  Skin:  Negative for color change, rash, hair loss and sensitivity to sunlight.  Allergic/Immunologic: Negative for susceptible to infections.  Neurological:  Positive for dizziness. Negative for headaches.  Hematological:  Negative for swollen glands.  Psychiatric/Behavioral:  Positive for depressed mood and sleep disturbance. The patient is nervous/anxious.     PMFS History:  Patient Active Problem List   Diagnosis Date Noted   High risk medication use 01/16/2023   Medication monitoring encounter 12/05/2021   Physical deconditioning 11/11/2021   Elevated serum lactate dehydrogenase (LDH) 08/17/2021   Vitamin D deficiency 08/17/2021   Chronic fatigue 07/25/2021   Vitamin B12 deficiency 07/19/2021   Hardening  of the aorta (main artery of the heart) (HCC) 06/21/2021   Solitary lung nodule 06/15/2021   Loss of weight 05/18/2021   Protein-calorie malnutrition, severe (HCC) 05/18/2021   Osteoporosis 06/15/2020   Iron deficiency anemia 04/09/2020   Primary osteoarthritis involving multiple joints 12/31/2018   Rheumatoid arthritis involving multiple sites with positive rheumatoid factor (HCC) 12/31/2018   Irritable bowel syndrome with both constipation and diarrhea 10/28/2018   Constipation 03/14/2016   Mitral valve prolapse 11/23/2015   High cholesterol 11/23/2015    Past Medical History:  Diagnosis Date   Anemia    Anxiety    Arthritis    RA   Cancer (HCC)    Skin   CHF (congestive heart failure) (HCC)     Collagen vascular disease (HCC)    Depression    Endometriosis    High cholesterol    IBS (irritable bowel syndrome)    Lymphadenopathy    Mitral valve prolapse    RA (rheumatoid arthritis) (HCC)    Rotator cuff tear    bilateral   Stroke (HCC) 06/2022   Uterus, adenomyosis     Family History  Problem Relation Age of Onset   Colon cancer Other    Bone cancer Mother    Brain cancer Father    Brain cancer Sister    Breast cancer Sister    Renal Disease Sister    Pancreatic cancer Neg Hx    Esophageal cancer Neg Hx    Stomach cancer Neg Hx    Rectal cancer Neg Hx    Past Surgical History:  Procedure Laterality Date   AXILLARY LYMPH NODE BIOPSY Right 10/12/2020   Procedure: RIGHT AXILLARY LYMPH NODE BIOPSY EXCISION;  Surgeon: Almond Lint, MD;  Location: [facility];  Service: General;  Laterality: Right;   BUNIONECTOMY Right    CARPAL TUNNEL RELEASE Right    COLONOSCOPY     COLONOSCOPY N/A 12/13/2015   Procedure: COLONOSCOPY;  Surgeon: Malissa Hippo, MD;  Location: [facility];  Service: Endoscopy;  Laterality: N/A;  9:55   Fatty tumor     2011 (left arm)   FOOT SURGERY     hammertoe   TONSILLECTOMY     TOTAL ABDOMINAL HYSTERECTOMY     2003   Social History   Social History Narrative   Not on file   Immunization History  Administered Date(s) Administered   Influenza, Quadrivalent, Recombinant, Inj, Pf 07/11/2017     Objective: Vital Signs: BP (!) 82/50 (BP Location: Left Arm, Patient Position: Sitting, Cuff Size: Normal)   Pulse 94   Resp 14   Ht 4\' 8"  (1.422 m)   Wt 71 lb (32.2 kg)   BMI 15.92 kg/m    Physical Exam   Musculoskeletal Exam:  Right shoulder limited abduction range of motion, bilateral tenderness to pressure Elbows full ROM no tenderness or swelling Prominent left ulnar styloid process with tenderness to pressure but no wrist synovitis Mild ulnar deviation and subluxation of MCP joint in bilateral hands that is fully  reducible, tenderness to pressure at left 4th-5th digits, without palpable synovitis Back pain midline and right around base of scapula and above scapula Knee tenderness to pressure bilaterally, no palpable effusion Cocked up toe deformities from the second through fourth MTP joints, tenderness to pressure no palpable synovitis    CDAI Exam: CDAI Score: 14  Patient Global: 50 / 100; Provider Global: 20 / 100 Swollen: 0 ; Tender: 7  Joint Exam 03/08/2023  Right  Left  Glenohumeral   Tender   Tender  Wrist      Tender  MCP 4      Tender  MCP 5      Tender  Knee   Tender   Tender      Investigation: No additional findings.  Imaging: XR Thoracic Spine 2 View  Result Date: 02/25/2023 [facility] and lateral views of the thoracic spine reviewed.  No spondylolisthesis.  Possible compression fracture of one of the vertebrae in the upper thoracic spine.  Mild spinal asymmetry but no scoliosis.   Recent Labs: Lab Results  Component Value Date   WBC 8.4 03/07/2023   HGB 11.7 (L) 03/07/2023   PLT 363 03/07/2023   NA 133 (L) 02/02/2023   K 3.2 (L) 02/02/2023   CL 97 (L) 02/02/2023   CO2 24 02/02/2023   GLUCOSE 96 02/02/2023   BUN 8 02/02/2023   CREATININE 0.59 02/02/2023   BILITOT 0.2 (L) 02/02/2023   ALKPHOS 127 (H) 02/02/2023   AST 19 02/02/2023   ALT 10 02/02/2023   PROT 7.2 02/02/2023   ALBUMIN 3.4 (L) 02/02/2023   CALCIUM 8.8 (L) 02/02/2023   GFRAA 92 11/06/2019   QFTBGOLDPLUS NEGATIVE 12/05/2021    Speciality Comments: No specialty comments available.  Procedures:  No procedures performed Allergies: Abatacept, Codeine, Golimumab, Hydrocodone, Methylprednisolone, Methotrexate derivatives, and Prednisone   Assessment / Plan:     Visit Diagnoses: Rheumatoid arthritis involving multiple sites with positive rheumatoid factor (HCC) - Plan: hydroxychloroquine (PLAQUENIL) 200 MG tablet  Seropositive RA with moderate activity persistently elevated inflammatory markers  unfortunately has had complications and intolerance to numerous treatments.  Discussed options and limitations for this for a while.  Previously documented hydroxychloroquine use without great success but I think this might be worth retrying as she is not safe for other oral medications and with frequent infectious complications on Biologics.  Due to underweight body mass recommend starting hydroxychloroquine at 100 mg daily.  Primary osteoarthritis involving multiple joints  Considerable amount of chronic pain related to pretty generalized osteoarthritis, having a lot of trouble with the knees recently.  She has had benefit with intra-articular steroid injections through orthopedics clinic for this.  High risk medication use  Reviewed risks of hydroxychloroquine including GI intolerance, cytopenias, and risk for retinal toxicity with chronic use requiring ophthalmology screening.  Orders: No orders of the defined types were placed in this encounter.  Meds ordered this encounter  Medications   hydroxychloroquine (PLAQUENIL) 200 MG tablet    Sig: Take 0.5 tablets (100 mg total) by mouth daily.    Dispense:  45 tablet    Refill:  0     Follow-Up Instructions: Return in about 3 months (around 06/08/2023) for RA ?HCQ start f/u 3mos.   Fuller Plan, MD  Note - This record has been created using [employer].  Chart creation errors have been sought, but may not always  have been located. Such creation errors do not reflect on  the standard of medical care.

## 2023-03-07 NOTE — Telephone Encounter (Signed)
Pt called stating that she was prescribed naratriptan (AMERGE) 2.5 MG tablet and she is wanting to ask the MD or RN if this medication will also happen to help with a nerve pain that she is suffering in the middle of her back. Please advise.

## 2023-03-07 NOTE — Telephone Encounter (Signed)
Patient called stating she has active bleeding. States her primary care believes it is a GI bleed. She is requesting a call back to see if anything can be done sooner than her appointment on 8/8. Please advise, thank you.

## 2023-03-07 NOTE — Telephone Encounter (Signed)
Patient called in response to "active bleeding." Patient states she does not have any bright red blood noted in her stool, but there is some blood noted in the occult stool. Patient wants to be seen, appt given for tomorrow at 3:40pm. Patient states she will see if she can get transportation to the OV. Asked patient to call in the am to notify if she will be able to keep her appt. Patient understood and states she will call. Phone number given to pt for the OV scheduled at the [facility] office to cancel because pt states she has no interest in going to [place] to be seen.

## 2023-03-07 NOTE — Telephone Encounter (Signed)
Patient called and confirmed appointment for 6/6.

## 2023-03-08 ENCOUNTER — Ambulatory Visit: Payer: Medicare HMO | Attending: Internal Medicine | Admitting: Internal Medicine

## 2023-03-08 ENCOUNTER — Ambulatory Visit (INDEPENDENT_AMBULATORY_CARE_PROVIDER_SITE_OTHER): Payer: Medicare HMO | Admitting: Gastroenterology

## 2023-03-08 ENCOUNTER — Encounter: Payer: Self-pay | Admitting: Gastroenterology

## 2023-03-08 ENCOUNTER — Encounter: Payer: Self-pay | Admitting: Internal Medicine

## 2023-03-08 VITALS — BP 106/70 | HR 98 | Ht <= 58 in | Wt 72.0 lb

## 2023-03-08 VITALS — BP 82/50 | HR 94 | Resp 14 | Ht <= 58 in | Wt 71.0 lb

## 2023-03-08 DIAGNOSIS — R195 Other fecal abnormalities: Secondary | ICD-10-CM | POA: Diagnosis not present

## 2023-03-08 DIAGNOSIS — D500 Iron deficiency anemia secondary to blood loss (chronic): Secondary | ICD-10-CM

## 2023-03-08 DIAGNOSIS — M0579 Rheumatoid arthritis with rheumatoid factor of multiple sites without organ or systems involvement: Secondary | ICD-10-CM | POA: Diagnosis not present

## 2023-03-08 DIAGNOSIS — K581 Irritable bowel syndrome with constipation: Secondary | ICD-10-CM

## 2023-03-08 DIAGNOSIS — M159 Polyosteoarthritis, unspecified: Secondary | ICD-10-CM | POA: Diagnosis not present

## 2023-03-08 DIAGNOSIS — Z79899 Other long term (current) drug therapy: Secondary | ICD-10-CM | POA: Diagnosis not present

## 2023-03-08 NOTE — Patient Instructions (Signed)
Hydroxychloroquine Tablets What is this medication? HYDROXYCHLOROQUINE (hye drox ee KLOR oh kwin) treats autoimmune conditions, such as rheumatoid arthritis and lupus. It works by slowing down an overactive immune system. It may also be used to prevent and treat malaria. It works by killing the parasite that causes malaria. It belongs to a group of medications called DMARDs. This medicine may be used for other purposes; ask your health care provider or pharmacist if you have questions. COMMON BRAND NAME(S): Plaquenil, Quineprox What should I tell my care team before I take this medication? They need to know if you have any of these conditions: Diabetes Eye disease, vision problems Frequently drink alcohol G6PD deficiency Heart disease Irregular heartbeat or rhythm Kidney disease Liver disease Porphyria Psoriasis An unusual or allergic reaction to hydroxychloroquine, other medications, foods, dyes, or preservatives Pregnant or trying to get pregnant Breastfeeding How should I use this medication? Take this medication by mouth with water. Take it as directed on the prescription label. Do not cut, crush, or chew this medication. Swallow the tablets whole. Take it with food. Do not take it more than directed. Take all of this medication unless your care team tells you to stop it early. Keep taking it even if you think you are better. Take products with antacids in them at a different time of day than this medication. Take this medication 4 hours before or 4 hours after antacids. Talk to your care team if you have questions. Talk to your care team about the use of this medication in children. While this medication may be prescribed for selected conditions, precautions do apply. Overdosage: If you think you have taken too much of this medicine contact a poison control center or emergency room at once. NOTE: This medicine is only for you. Do not share this medicine with others. What if I miss a  dose? If you miss a dose, take it as soon as you can. If it is almost time for your next dose, take only that dose. Do not take double or extra doses. What may interact with this medication? Do not take this medication with any of the following: Cisapride Dronedarone Pimozide Thioridazine This medication may also interact with the following: Ampicillin Antacids Cimetidine Cyclosporine Digoxin Kaolin Medications for diabetes, such as insulin, glipizide, glyburide Medications for seizures, such as carbamazepine, phenobarbital, phenytoin Mefloquine Methotrexate Other medications that cause heart rhythm changes Praziquantel This list may not describe all possible interactions. Give your health care provider a list of all the medicines, herbs, non-prescription drugs, or dietary supplements you use. Also tell them if you smoke, drink alcohol, or use illegal drugs. Some items may interact with your medicine. What should I watch for while using this medication? Visit your care team for regular checks on your progress. Tell your care team if your symptoms do not start to get better or if they get worse. You may need blood work done while you are taking this medication. If you take other medications that can affect heart rhythm, you may need more testing. Talk to your care team if you have questions. Your vision may be tested before and during use of this medication. Tell your care team right away if you have any change in your eyesight. This medication may cause serious skin reactions. They can happen weeks to months after starting the medication. Contact your care team right away if you notice fevers or flu-like symptoms with a rash. The rash may be red or purple and then turn   into blisters or peeling of the skin. Or, you might notice a red rash with swelling of the face, lips or lymph nodes in your neck or under your arms. If you or your family notice any changes in your behavior, such as new or  worsening depression, thoughts of harming yourself, anxiety, or other unusual or disturbing thoughts, or memory loss, call your care team right away. What side effects may I notice from receiving this medication? Side effects that you should report to your care team as soon as possible: Allergic reactions--skin rash, itching, hives, swelling of the face, lips, tongue, or throat Aplastic anemia--unusual weakness or fatigue, dizziness, headache, trouble breathing, increased bleeding or bruising Change in vision Heart rhythm changes--fast or irregular heartbeat, dizziness, feeling faint or lightheaded, chest pain, trouble breathing Infection--fever, chills, cough, or sore throat Low blood sugar (hypoglycemia)--tremors or shaking, anxiety, sweating, cold or clammy skin, confusion, dizziness, rapid heartbeat Muscle injury--unusual weakness or fatigue, muscle pain, dark yellow or brown urine, decrease in amount of urine Pain, tingling, or numbness in the hands or feet Rash, fever, and swollen lymph nodes Redness, blistering, peeling, or loosening of the skin, including inside the mouth Thoughts of suicide or self-harm, worsening mood, or feelings of depression Unusual bruising or bleeding Side effects that usually do not require medical attention (report to your care team if they continue or are bothersome): Diarrhea Headache Nausea Stomach pain Vomiting This list may not describe all possible side effects. Call your doctor for medical advice about side effects. You may report side effects to FDA at 1-800-FDA-1088. Where should I keep my medication? Keep out of the reach of children and pets. Store at room temperature up to 30 degrees C (86 degrees F). Protect from light. Get rid of any unused medication after the expiration date. To get rid of medications that are no longer needed or have expired: Take the medication to a medication take-back program. Check with your pharmacy or law enforcement  to find a location. If you cannot return the medication, check the label or package insert to see if the medication should be thrown out in the garbage or flushed down the toilet. If you are not sure, ask your care team. If it is safe to put it in the trash, empty the medication out of the container. Mix the medication with cat litter, dirt, coffee grounds, or other unwanted substance. Seal the mixture in a bag or container. Put it in the trash. NOTE: This sheet is a summary. It may not cover all possible information. If you have questions about this medicine, talk to your doctor, pharmacist, or health care provider.  2024 Elsevier/Gold Standard (2022-03-27 00:00:00)  

## 2023-03-08 NOTE — Telephone Encounter (Signed)
Called and advised. She stated understanding and would try them

## 2023-03-08 NOTE — Patient Instructions (Signed)
_______________________________________________________  If your blood pressure at your visit was 140/90 or greater, please contact your primary care physician to follow up on this.  _______________________________________________________  If you are age 67 or older, your body mass index should be between 23-30. Your Body mass index is 16.14 kg/m. If this is out of the aforementioned range listed, please consider follow up with your Primary Care Provider.  If you are age 64 or younger, your body mass index should be between 19-25. Your Body mass index is 16.14 kg/m. If this is out of the aformentioned range listed, please consider follow up with your Primary Care Provider.   ________________________________________________________  The [facility] providers would like to encourage you to use [facility] to communicate with providers for non-urgent requests or questions.  Due to long hold times on the telephone, sending your provider a message by [facility] may be a faster and more efficient way to get a response.  Please allow 48 business hours for a response.  Please remember that this is for non-urgent requests.  _______________________________________________________  It was a pleasure to see you today!  Thank you for trusting me with your gastrointestinal care!

## 2023-03-08 NOTE — Progress Notes (Signed)
[facility] Consult Note:  History: Vanessa Lara 03/08/2023  Referring provider: [name], NP  Reason for consult/chief complaint: positve stool test (Pt states she is here because she had a positive stool test.)   Subjective  HPI: This is a 67 year old woman seeing me for the first time, most recently seen by Dr. Orvan Lara, who last saw her in May 2022 summarizing her GI issues as follows: "Returns today in scheduled follow-up. Weight is down 3 pounds since her last visit here.  Recent constipation. Some flare in symptoms of IBS this week. She reports having a bowel movement once every 1 to 2 weeks. Her stool is either soft, formed or loose like mud. She is intolerant to fiber, stool softeners and laxatives which results in stomach pain and bloat.   IMPRESSION:  Unexplained weight loss despite EGD, colonoscopy, CTAP, and CTE Constipation-prdominent IBS x 30 years, with change in symptoms 10 years ago Abnormal PET scan 07/09/20 showing metabolic uptake at TI/ICV valve    - no discrete mass on CT abd/pelvis    - no mass on colonoscopy to the cecum (TI was not intubated) Elevated IgA level Iron deficiency anemia following by Vanessa Lara   PLAN: - Continue pantoprazole 40 mg BID - Virtual capsule endoscopy to further evaluate her weight loss and iron deficiency to exclude Crohns, malignancy - Consider empiric trial of Xifaxan if capsule endoscopy is negative - Low threshold for referral to tertiary care center for second opinion" _____________   I read this patient's video capsule study in May 2022, done for weight loss and iron deficiency anemia.  Negative study. Patient has previous GI evaluations by Vanessa Lara at [facility] and [facility] prior to that.  This patient's hematologist referred her to regional GI last week, patient contacted our office wishing to return here.  Ongoing iron deficiency anemia and recent stool test positive  for occult blood.  Vanessa Lara reports ongoing generalized abdominal pain and primarily constipation but also intermittent urgency and loose stool consistent with her longstanding IBS.  She is understandably concerned about the blood detected in the stool, says she does not see black tarry stool or bright red blood per rectum.  Appetite remains generally poor and she has been unable to put on weight in the last couple of years.  Quite fatigued, significant joint pain from her arthritis.   ROS:  Review of Systems Denies chest pain dyspnea or dysuria  Past Medical History: Past Medical History:  Diagnosis Date   Anemia    Anxiety    Arthritis    RA   Cancer (HCC)    Skin   CHF (congestive heart failure) (HCC)    Collagen vascular disease (HCC)    Depression    Endometriosis    High cholesterol    IBS (irritable bowel syndrome)    Lymphadenopathy    Mitral valve prolapse    RA (rheumatoid arthritis) (HCC)    Rotator cuff tear    bilateral   Stroke (HCC) 06/2022   Uterus, adenomyosis      Past Surgical History: Past Surgical History:  Procedure Laterality Date   AXILLARY LYMPH NODE BIOPSY Right 10/12/2020   Procedure: RIGHT AXILLARY LYMPH NODE BIOPSY EXCISION;  Surgeon: Vanessa Lint, MD;  Location:  SURGERY CENTER;  Service: General;  Laterality: Right;   BUNIONECTOMY Right    CARPAL TUNNEL RELEASE Right    COLONOSCOPY     COLONOSCOPY N/A 12/13/2015   Procedure: COLONOSCOPY;  Surgeon: Vanessa Hippo, MD;  Location: [facility];  Service: Endoscopy;  Laterality: N/A;  9:55   Fatty tumor     2011 (left arm)   FOOT SURGERY     hammertoe   TONSILLECTOMY     TOTAL ABDOMINAL HYSTERECTOMY     2003     Family History: Family History  Problem Relation Age of Onset   Colon cancer Other    Bone cancer Mother    Brain cancer Father    Brain cancer Sister    Breast cancer Sister    Renal Disease Sister    Pancreatic cancer Neg Hx    Esophageal cancer Neg Hx     Stomach cancer Neg Hx    Rectal cancer Neg Hx     Social History: Social History   Socioeconomic History   Marital status: Single    Spouse name: Not on file   Number of children: Not on file   Years of education: Not on file   Highest education level: Not on file  Occupational History   Occupation: retired  Tobacco Use   Smoking status: Some Days    Packs/day: 0.50    Years: 33.00    Additional pack years: 0.00    Total pack years: 16.50    Types: Cigarettes    Last attempt to quit: 2013    Years since quitting: 11.4   Smokeless tobacco: Never  Vaping Use   Vaping Use: Never used  Substance and Sexual Activity   Alcohol use: No    Alcohol/week: 0.0 standard drinks of alcohol   Drug use: No   Sexual activity: Not Currently  Other Topics Concern   Not on file  Social History Narrative   Not on file   Social Determinants of Health   Financial Resource Strain: Not on file  Food Insecurity: Not on file  Transportation Needs: Not on file  Physical Activity: Not on file  Stress: Not on file  Social Connections: Not on file    Allergies: Allergies  Allergen Reactions   Abatacept Other (See Comments)   Codeine Other (See Comments)    Stomach pains/constipation- due to IBS   Golimumab Other (See Comments)   Hydrocodone Other (See Comments)    She does not want to take this due to intolerance to codeine.   Methylprednisolone Diarrhea and Other (See Comments)    "Made my IBS flare up"  Diarrhea, nausea   Methotrexate Derivatives Other (See Comments)    Agitation; pt stated, "I get no sleep; one dose caused no sleep for 7 days and 7 nights - that was when I got a 0.5 injection"   Prednisone Rash    Cannot take prednisone by mouth ---- red rash all over    Outpatient Meds: Current Outpatient Medications  Medication Sig Dispense Refill   acetaminophen (TYLENOL) 500 MG tablet Take 500 mg by mouth every 6 (six) hours as needed.     acetaminophen-codeine (TYLENOL  #3) 300-30 MG tablet Take 1 tablet by mouth daily as needed for moderate pain. 30 tablet 0   ASPIRIN 81 PO Take 1 tablet by mouth daily.     atorvastatin (LIPITOR) 20 MG tablet Take 1 tablet (20 mg total) by mouth daily. 30 tablet 6   BD SYRINGE SLIP TIP 25G X 5/8" 1 ML MISC USE AS DIRECTED FOR ONCE MONTHLY B12 INJECTIONS 10 each 0   Cholecalciferol (VITAMIN D3) 25 MCG (1000 UT) CAPS 1 capsule Orally Once a day  for 30 day(s)     cyclobenzaprine (FLEXERIL) 5 MG tablet Take 1 tablet (5 mg total) by mouth at bedtime as needed for muscle spasms. 30 tablet 0   diclofenac Sodium (VOLTAREN) 1 % GEL SMARTSIG:Gram(s) Topical 4 Times Daily PRN     docusate sodium (COLACE) 100 MG capsule Take 1 capsule (100 mg total) by mouth 2 (two) times daily. 60 capsule 2   fentaNYL (DURAGESIC) 50 MCG/HR Place onto the skin every 3 (three) days.     FORTEO 600 MCG/2.4ML SOPN ADMINISTER 0.08 ML UNDER THE SKIN DAILY 2.4 mL 0   furosemide (LASIX) 20 MG tablet Take 20 mg by mouth daily as needed.     Heat Wraps (THERMACARE BACK/HIP) MISC 1 application by Does not apply route daily. 3 each 11   hydroxychloroquine (PLAQUENIL) 200 MG tablet 1 tablet with food or milk Orally every other day for 30 days     hydrOXYzine (ATARAX) 25 MG tablet Take 25 mg by mouth 2 (two) times daily as needed for itching.     hydrOXYzine (ATARAX) 50 MG tablet Take by mouth.     Insulin Pen Needle (SURE COMFORT PEN NEEDLES) 30G X 8 MM MISC Use as directed 100 each 1   lidocaine (LIDODERM) 5 % one patch daily.     lipase/protease/amylase (CREON) 12000-38000 units CPEP capsule Use 1 capsule with meals and snacks 270 capsule 1   megestrol (MEGACE) 40 MG tablet Take 1 tablet (40 mg total) by mouth daily. 30 tablet 6   metoprolol tartrate (LOPRESSOR) 25 MG tablet      MYRBETRIQ 25 MG TB24 tablet Take 25 mg by mouth daily.     naloxone (NARCAN) nasal spray 4 mg/0.1 mL SMARTSIG:Both Nares     naratriptan (AMERGE) 2.5 MG tablet Take 1 tablet (2.5 mg  total) by mouth as needed for migraine. Take one (1) tablet at onset of headache; if returns or does not resolve, may repeat after 4 hours; do not exceed five (5) mg in 24 hours. 10 tablet 6   ondansetron (ZOFRAN-ODT) 4 MG disintegrating tablet Take 4 mg by mouth every 8 (eight) hours as needed.     pantoprazole (PROTONIX) 40 MG tablet TAKE 1 TABLET(40 MG) BY MOUTH TWICE DAILY 180 tablet 3   PARoxetine (PAXIL) 20 MG tablet Take 20 mg by mouth daily.     promethazine (PHENERGAN) 25 MG tablet Take 0.5-1 tablets (12.5-25 mg total) by mouth every 6 (six) hours as needed for nausea or vomiting. 20 tablet 0   Syringe/Needle, Disp, (SYRINGE 3CC/23GX1") 23G X 1" 3 ML MISC by Does not apply route.     traZODone (DESYREL) 100 MG tablet Take 1 tablet (100 mg total) by mouth at bedtime as needed for sleep. 30 tablet 6   alendronate (FOSAMAX) 70 MG tablet  (Patient not taking: Reported on 03/08/2023)     cyanocobalamin (,VITAMIN B-12,) 1000 MCG/ML injection Inject 1,000 mcg into the skin every 30 (thirty) days. (Patient not taking: Reported on 03/08/2023)     dicyclomine (BENTYL) 10 MG capsule Take 1 capsule (10 mg total) by mouth 4 (four) times daily as needed for spasms. (Patient not taking: Reported on 03/08/2023) 360 capsule 1   LORazepam (ATIVAN) 0.5 MG tablet TAKE 1/2 TO 1 TABLET BY MOUTH AT BEDTIME AS NEEDED (Patient not taking: Reported on 03/08/2023) 60 tablet 0   predniSONE (DELTASONE) 5 MG tablet Take 1 tablet (5 mg total) by mouth daily with breakfast. (Patient not taking: Reported on 03/08/2023)  30 tablet 1   pregabalin (LYRICA) 25 MG capsule Take 1 capsule (25 mg total) by mouth 2 (two) times daily. (Patient not taking: Reported on 03/08/2023) 60 capsule 5   Teriparatide, Recombinant, (FORTEO) 600 MCG/2.4ML SOPN INJECT 20 MCG UNDER THE SKIN (SUBCUTANEOUS INJECTION) DAILY (Patient not taking: Reported on 03/08/2023) 2.4 mL 5   No current facility-administered medications for this visit.       ___________________________________________________________________ Objective   Exam:  BP 106/70   Pulse 98   Ht 4\' 8"  (1.422 m)   Wt 72 lb (32.7 kg)   BMI 16.14 kg/m  Wt Readings from Last 3 Encounters:  03/08/23 72 lb (32.7 kg)  03/08/23 71 lb (32.2 kg)  02/21/23 72 lb 12.8 oz (33 kg)   Cachectic woman, no acute distress Eyes: sclera anicteric, no redness ENT: oral mucosa moist without lesions, no cervical or supraclavicular lymphadenopathy CV: Regular without appreciable murmur, no JVD, no peripheral edema Resp: clear to auscultation bilaterally, normal RR and effort noted GI: soft, scattered tenderness, with active bowel sounds. No guarding or palpable organomegaly noted.  No distention Skin; warm and dry, no rash or jaundice noted Neuro: awake, alert and oriented x 3. Normal gross motor function and fluent speech Ulnar deviation of digits of both hands Labs:     Latest Ref Rng & Units 03/07/2023    9:05 AM 02/02/2023    1:39 PM 01/16/2023    4:55 PM  CBC  WBC 4.0 - 10.5 K/uL 8.4  9.6  9.1   Hemoglobin 12.0 - 15.0 g/dL 82.9  9.8  9.8   Hematocrit 36.0 - 46.0 % 36.4  31.4  30.4   Platelets 150 - 400 K/uL 363  372  391       Latest Ref Rng & Units 02/02/2023    1:39 PM 01/16/2023    4:55 PM 10/03/2022    2:51 PM  CMP  Glucose 70 - 99 mg/dL 96  99  93   BUN 8 - 23 mg/dL 8  9  10    Creatinine 0.44 - 1.00 mg/dL 5.62  1.30  8.65   Sodium 135 - 145 mmol/L 133  136  140   Potassium 3.5 - 5.1 mmol/L 3.2  4.2  4.3   Chloride 98 - 111 mmol/L 97  100  102   CO2 22 - 32 mmol/L 24  30  29    Calcium 8.9 - 10.3 mg/dL 8.8  9.5  78.4   Total Protein 6.5 - 8.1 g/dL 7.2  6.5  7.2   Total Bilirubin 0.3 - 1.2 mg/dL 0.2  0.2  0.2   Alkaline Phos 38 - 126 U/L 127     AST 15 - 41 U/L 19  14  16    ALT 0 - 44 U/L 10  5  10     Iron/TIBC/Ferritin/ %Sat    Component Value Date/Time   IRON 28 02/02/2023 1339   IRON 18 (L) 10/07/2020 1321   TIBC 304 02/02/2023 1339   TIBC 180 (L)  10/07/2020 1321   FERRITIN 44 02/02/2023 1339   FERRITIN 281 (H) 10/07/2020 1321   IRONPCTSAT 9 (L) 02/02/2023 1339   IRONPCTSAT 11 (L) 01/16/2023 1655     Assessment: Encounter Diagnoses  Name Primary?   Iron deficiency anemia due to chronic blood loss Yes   Heme positive stool    Irritable bowel syndrome with constipation     Years of iron deficiency anemia.  No prior stool studies before the recent  ones, and no source of blood loss seen on EGD/colonoscopy and small bowel video capsule study within the last 2 years.  However, I suspect she has probably had occult GI blood loss from obscure small bowel source such as AVM(s).  If that is correct, it is probably the same source now.  Doubtful she has developed a neoplastic cause such as gastric or colon cancer.  She is on aspirin which is a risk factor for gastric or duodenal ulcer, though she is on a PPI.  If she does not have a strong indication for aspirin then it should be discontinued.  Since there is the possibility of her having an interval development of a discoverable source of GI blood loss since the last testing, I offered her repeat upper endoscopy, colonoscopy and (if unrevealing) video capsule study.  She does not wish to do that at this point but will reconsider it if she has worsening anemia despite IV iron treatments.  She will return to care with hematology for close monitoring of hemoglobin and iron levels with periodic IV iron treatments. No further recommendations at this point regarding her IBS.  Thank you for the courtesy of this consult.  Please call me with any questions or concerns.  Charlie Pitter III  CC: Referring provider noted above

## 2023-03-09 ENCOUNTER — Inpatient Hospital Stay: Payer: Medicare HMO

## 2023-03-09 VITALS — BP 97/40 | HR 85 | Temp 97.6°F | Resp 18

## 2023-03-09 DIAGNOSIS — D509 Iron deficiency anemia, unspecified: Secondary | ICD-10-CM | POA: Diagnosis not present

## 2023-03-09 DIAGNOSIS — D500 Iron deficiency anemia secondary to blood loss (chronic): Secondary | ICD-10-CM

## 2023-03-09 MED ORDER — SODIUM CHLORIDE 0.9 % IV SOLN: Freq: Once | INTRAVENOUS | Status: AC

## 2023-03-09 MED ORDER — SODIUM CHLORIDE 0.9 % IV SOLN
300.0000 mg | Freq: Once | INTRAVENOUS | Status: AC
  Administered 2023-03-09: 300 mg via INTRAVENOUS
  Filled 2023-03-09: qty 300

## 2023-03-09 NOTE — Progress Notes (Signed)
Patient presents today for iron infusion.  Patient is in satisfactory condition with no new complaints voiced.  Vital signs are stable.  IV placed in left AC.  IV flushed well with good blood return noted.  Zyrtec and Tylenol taken at 0815 at home prior to visit.  We will hold pre-medications here. We will proceed with infusion per provider orders.    Patient tolerated infusion well with no complaints voiced.  Patient left via wheelchair with aid in stable condition.  Vital signs stable at discharge.  Follow up as scheduled.

## 2023-03-09 NOTE — Patient Instructions (Signed)
[facility]  Discharge Instructions: Thank you for choosing  Cancer Center to provide your oncology and hematology care.  If you have a lab appointment with the Cancer Center - please note that after April 8th, 2024, all labs will be drawn in the cancer center.  You do not have to check in or register with the main entrance as you have in the past but will complete your check-in in the cancer center.  Wear comfortable clothing and clothing appropriate for easy access to any Portacath or PICC line.   We strive to give you quality time with your provider. You may need to reschedule your appointment if you arrive late (15 or more minutes).  Arriving late affects you and other patients whose appointments are after yours.  Also, if you miss three or more appointments without notifying the office, you may be dismissed from the clinic at the provider's discretion.      For prescription refill requests, have your pharmacy contact our office and allow 72 hours for refills to be completed.    Iron Sucrose Injection What is this medication? IRON SUCROSE (EYE ern SOO krose) treats low levels of iron (iron deficiency anemia) in people with kidney disease. Iron is a mineral that plays an important role in making red blood cells, which carry oxygen from your lungs to the rest of your body. This medicine may be used for other purposes; ask your health care provider or pharmacist if you have questions. COMMON BRAND NAME(S): Venofer What should I tell my care team before I take this medication? They need to know if you have any of these conditions: Anemia not caused by low iron levels Heart disease High levels of iron in the blood Kidney disease Liver disease An unusual or allergic reaction to iron, other medications, foods, dyes, or preservatives Pregnant or trying to get pregnant Breastfeeding How should I use this medication? This medication is for infusion into a vein. It  is given in a hospital or clinic setting. Talk to your care team about the use of this medication in children. While this medication may be prescribed for children as young as 2 years for selected conditions, precautions do apply. Overdosage: If you think you have taken too much of this medicine contact a poison control center or emergency room at once. NOTE: This medicine is only for you. Do not share this medicine with others. What if I miss a dose? Keep appointments for follow-up doses. It is important not to miss your dose. Call your care team if you are unable to keep an appointment. What may interact with this medication? Do not take this medication with any of the following: Deferoxamine Dimercaprol Other iron products This medication may also interact with the following: Chloramphenicol Deferasirox This list may not describe all possible interactions. Give your health care provider a list of all the medicines, herbs, non-prescription drugs, or dietary supplements you use. Also tell them if you smoke, drink alcohol, or use illegal drugs. Some items may interact with your medicine. What should I watch for while using this medication? Visit your care team regularly. Tell your care team if your symptoms do not start to get better or if they get worse. You may need blood work done while you are taking this medication. You may need to follow a special diet. Talk to your care team. Foods that contain iron include: whole grains/cereals, dried fruits, beans, or peas, leafy green vegetables, and organ meats (  liver, kidney). What side effects may I notice from receiving this medication? Side effects that you should report to your care team as soon as possible: Allergic reactions--skin rash, itching, hives, swelling of the face, lips, tongue, or throat Low blood pressure--dizziness, feeling faint or lightheaded, blurry vision Shortness of breath Side effects that usually do not require medical  attention (report to your care team if they continue or are bothersome): Flushing Headache Joint pain Muscle pain Nausea Pain, redness, or irritation at injection site This list may not describe all possible side effects. Call your doctor for medical advice about side effects. You may report side effects to FDA at 1-800-FDA-1088. Where should I keep my medication? This medication is given in a hospital or clinic and will not be stored at home. NOTE: This sheet is a summary. It may not cover all possible information. If you have questions about this medicine, talk to your doctor, pharmacist, or health care provider.  2024 Elsevier/Gold Standard (2022-03-29 00:00:00)    To help prevent nausea and vomiting after your treatment, we encourage you to take your nausea medication as directed.  BELOW ARE SYMPTOMS THAT SHOULD BE REPORTED IMMEDIATELY: *FEVER GREATER THAN 100.4 F (38 C) OR HIGHER *CHILLS OR SWEATING *NAUSEA AND VOMITING THAT IS NOT CONTROLLED WITH YOUR NAUSEA MEDICATION *UNUSUAL SHORTNESS OF BREATH *UNUSUAL BRUISING OR BLEEDING *URINARY PROBLEMS (pain or burning when urinating, or frequent urination) *BOWEL PROBLEMS (unusual diarrhea, constipation, pain near the anus) TENDERNESS IN MOUTH AND THROAT WITH OR WITHOUT PRESENCE OF ULCERS (sore throat, sores in mouth, or a toothache) UNUSUAL RASH, SWELLING OR PAIN  UNUSUAL VAGINAL DISCHARGE OR ITCHING   Items with * indicate a potential emergency and should be followed up as soon as possible or go to the Emergency Department if any problems should occur.  Please show the CHEMOTHERAPY ALERT CARD or IMMUNOTHERAPY ALERT CARD at check-in to the Emergency Department and triage nurse.  Should you have questions after your visit or need to cancel or reschedule your appointment, please contact [facility] [phone]  and follow the prompts.  Office hours are 8:00 a.m. to 4:30 p.m. Monday - Friday. Please note that  voicemails left after 4:00 p.m. may not be returned until the following business day.  We are closed weekends and major holidays. You have access to a nurse at all times for urgent questions. Please call the main number to the clinic [phone] and follow the prompts.  For any non-urgent questions, you may also contact your provider using MyChart. We now offer e-Visits for anyone 18 and older to request care online for non-urgent symptoms. For details visit [email or web address].   Also download the MyChart app! Go to the app store, search "MyChart", open the app, select [facility], and log in with your MyChart username and password.   

## 2023-03-10 MED ORDER — HYDROXYCHLOROQUINE SULFATE 200 MG PO TABS
100.0000 mg | ORAL_TABLET | Freq: Every day | ORAL | 0 refills | Status: DC
Start: 2023-03-10 — End: 2023-04-18

## 2023-03-13 ENCOUNTER — Ambulatory Visit: Payer: Medicare HMO | Admitting: Internal Medicine

## 2023-03-19 ENCOUNTER — Ambulatory Visit (INDEPENDENT_AMBULATORY_CARE_PROVIDER_SITE_OTHER): Payer: Medicare HMO | Admitting: Surgical

## 2023-03-19 DIAGNOSIS — M1711 Unilateral primary osteoarthritis, right knee: Secondary | ICD-10-CM | POA: Diagnosis not present

## 2023-03-19 DIAGNOSIS — M545 Low back pain, unspecified: Secondary | ICD-10-CM

## 2023-03-19 DIAGNOSIS — M170 Bilateral primary osteoarthritis of knee: Secondary | ICD-10-CM | POA: Diagnosis not present

## 2023-03-19 DIAGNOSIS — M1712 Unilateral primary osteoarthritis, left knee: Secondary | ICD-10-CM | POA: Diagnosis not present

## 2023-03-21 ENCOUNTER — Other Ambulatory Visit (HOSPITAL_COMMUNITY): Payer: Self-pay

## 2023-03-22 ENCOUNTER — Encounter: Payer: Self-pay | Admitting: Surgical

## 2023-03-22 ENCOUNTER — Telehealth: Payer: Self-pay | Admitting: Surgical

## 2023-03-22 ENCOUNTER — Telehealth: Payer: Self-pay | Admitting: Orthopedic Surgery

## 2023-03-22 MED ORDER — LIDOCAINE HCL 1 % IJ SOLN
5.0000 mL | INTRAMUSCULAR | Status: AC | PRN
Start: 2023-03-19 — End: 2023-03-19
  Administered 2023-03-19: 5 mL

## 2023-03-22 MED ORDER — BUPIVACAINE HCL 0.25 % IJ SOLN
4.0000 mL | INTRAMUSCULAR | Status: AC | PRN
Start: 2023-03-19 — End: 2023-03-19
  Administered 2023-03-19: 4 mL via INTRA_ARTICULAR

## 2023-03-22 NOTE — Telephone Encounter (Signed)
Pt called stating she is having her MRI on tomorrow and would like to go over results over the phone with PA [facility]. Pt phone is [phone].

## 2023-03-22 NOTE — Telephone Encounter (Signed)
Holding until results available. Also, Franky Macho out of office until Monday.

## 2023-03-22 NOTE — Progress Notes (Signed)
Office Visit Note   Patient: Vanessa Lara           Date of Birth: 08-Oct-1955           MRN: [id number] Visit Date: 03/19/2023 Requested by: [name], NP [address] [place],  [place] [address] PCP: [name], NP  Subjective: Chief Complaint  Patient presents with   Lower Back - Pain    HPI: Vanessa Lara is a 67 y.o. female who presents to the office reporting bilateral knee pain.  Patient also complains of right-sided thoracic back pain that has been bothering her.  She has MRI scan scheduled currently for late July.  No new injuries.  Knee injections have been helpful for her but it is too soon to repeat injections..                ROS: All systems reviewed are negative as they relate to the chief complaint within the history of present illness.  Patient denies fevers or chills.  Assessment & Plan: Visit Diagnoses:  1. Primary osteoarthritis of left knee   2. Primary osteoarthritis of right knee   3. Midline low back pain, unspecified chronicity, unspecified whether sciatica present     Plan: Patient is a 67 year old female who presents for evaluation of bilateral knee pain.  She also complains of continued thoracic back pain.  We ordered an MRI in the past but this is currently scheduled for late July.  Plan to have her reach out to [facility] imaging to schedule this sooner which should be doable; most likely it was scheduled left for out to accommodate her wanting to have both MRIs done at the same time between the thoracic spine MRI and the hand MRI that she has upcoming.  Regarding the bilateral knees, we will inject with Toradol injection today consisting of 1 cc of Toradol mixed with 4 cc of pubic pain bilaterally.  This was administered and patient tolerated procedure well without complication.  This is really the only option for her knees but we can do the gel injections which have been most helpful for her in the past sometime in late  July.  This patient is diagnosed with osteoarthritis of the knee(s).    Radiographs show evidence of joint space narrowing, osteophytes, subchondral sclerosis and/or subchondral cysts.  This patient has knee pain which interferes with functional and activities of daily living.    This patient has experienced inadequate response, adverse effects and/or intolerance with conservative treatments such as acetaminophen, NSAIDS, topical creams, physical therapy or regular exercise, knee bracing and/or weight loss.   This patient has experienced inadequate response or has a contraindication to intra articular steroid injections for at least 3 months.   This patient is not scheduled to have a total knee replacement within 6 months of starting treatment with viscosupplementation.   Follow-Up Instructions: No follow-ups on file.   Orders:  No orders of the defined types were placed in this encounter.  No orders of the defined types were placed in this encounter.     Procedures: Large Joint Inj: bilateral knee on 03/19/2023 11:52 AM Indications: diagnostic evaluation, joint swelling and pain Details: 18 G 1.5 in needle, superolateral approach  Arthrogram: No  Medications (Right): 5 mL lidocaine 1 %; 4 mL bupivacaine 0.25 % Medications (Left): 5 mL lidocaine 1 %; 4 mL bupivacaine 0.25 % Outcome: tolerated well, no immediate complications Procedure, treatment alternatives, risks and benefits explained, specific risks  discussed. Consent was given by the patient. Immediately prior to procedure a time out was called to verify the correct patient, procedure, equipment, support staff and site/side marked as required. Patient was prepped and draped in the usual sterile fashion.       Clinical Data: No additional findings.  Objective: Vital Signs: There were no vitals taken for this visit.  Physical Exam:  Constitutional: Patient appears well-developed HEENT:  Head: Normocephalic Eyes:EOM  are normal Neck: Normal range of motion Cardiovascular: Normal rate Pulmonary/chest: Effort normal Neurologic: Patient is alert Skin: Skin is warm Psychiatric: Patient has normal mood and affect  Ortho Exam: Ortho exam demonstrates bilateral knees without effusion.  No cellulitis or skin changes noted around the knees.  No masses or lymphadenopathy noted through the thoracic back.  She has full active and passive range of motion of both knees.  Able to perform straight leg raise without extensor lag.  Tenderness over the medial lateral joint lines of both knees.  No pain with hip range of motion bilaterally.  Specialty Comments:  MRI CERVICAL SPINE WITHOUT CONTRAST   TECHNIQUE: Multiplanar, multisequence MR imaging of the cervical spine was performed. No intravenous contrast was administered.   COMPARISON:  None.   FINDINGS: Alignment: 2 mm retrolisthesis of C5 on C6.   Vertebrae: No acute fracture, evidence of discitis, or bone lesion.   Cord: Normal signal and morphology.   Posterior Fossa, vertebral arteries, paraspinal tissues: Posterior fossa demonstrates no focal abnormality. Vertebral artery flow voids are maintained. Paraspinal soft tissues are unremarkable.   Disc levels:   Discs: Degenerative disease with disc height loss at C4-5 and C5-6.   C2-3: No significant disc bulge. No neural foraminal stenosis. No central canal stenosis.   C3-4: Mild broad-based disc bulge. No foraminal or central canal stenosis.   C4-5: Mild broad-based disc bulge. No foraminal or central canal stenosis.   C5-6: Mild broad-based disc bulge. Bilateral uncovertebral degenerative changes. Moderate left and severe right foraminal stenosis. No spinal stenosis.   C6-7: Mild broad-based disc bulge. No foraminal or central canal stenosis.   C7-T1: No significant disc bulge. No neural foraminal stenosis. No central canal stenosis.   IMPRESSION: 1. At C5-6 there is a mild broad-based  disc bulge. Bilateral uncovertebral degenerative changes. Moderate left and severe right foraminal stenosis. 2. Otherwise, mild cervical spine spondylosis as described above.     Electronically Signed   By: Elige Ko M.D.   On: 12/16/2021 10:51  Imaging: No results found.   PMFS History: Patient Active Problem List   Diagnosis Date Noted   High risk medication use 01/16/2023   Medication monitoring encounter 12/05/2021   Physical deconditioning 11/11/2021   Elevated serum lactate dehydrogenase (LDH) 08/17/2021   Vitamin D deficiency 08/17/2021   Chronic fatigue 07/25/2021   Vitamin B12 deficiency 07/19/2021   Hardening of the aorta (main artery of the heart) (HCC) 06/21/2021   Solitary lung nodule 06/15/2021   Loss of weight 05/18/2021   Protein-calorie malnutrition, severe (HCC) 05/18/2021   Osteoporosis 06/15/2020   Iron deficiency anemia 04/09/2020   Primary osteoarthritis involving multiple joints 12/31/2018   Rheumatoid arthritis involving multiple sites with positive rheumatoid factor (HCC) 12/31/2018   Irritable bowel syndrome with both constipation and diarrhea 10/28/2018   Constipation 03/14/2016   Mitral valve prolapse 11/23/2015   High cholesterol 11/23/2015   Past Medical History:  Diagnosis Date   Anemia    Anxiety    Arthritis    RA   Cancer (  HCC)    Skin   CHF (congestive heart failure) (HCC)    Collagen vascular disease (HCC)    Depression    Endometriosis    High cholesterol    IBS (irritable bowel syndrome)    Lymphadenopathy    Mitral valve prolapse    RA (rheumatoid arthritis) (HCC)    Rotator cuff tear    bilateral   Stroke (HCC) 06/2022   Uterus, adenomyosis     Family History  Problem Relation Age of Onset   Colon cancer Other    Bone cancer Mother    Brain cancer Father    Brain cancer Sister    Breast cancer Sister    Renal Disease Sister    Pancreatic cancer Neg Hx    Esophageal cancer Neg Hx    Stomach cancer Neg Hx     Rectal cancer Neg Hx     Past Surgical History:  Procedure Laterality Date   AXILLARY LYMPH NODE BIOPSY Right 10/12/2020   Procedure: RIGHT AXILLARY LYMPH NODE BIOPSY EXCISION;  Surgeon: Almond Lint, MD;  Location: [facility];  Service: General;  Laterality: Right;   BUNIONECTOMY Right    CARPAL TUNNEL RELEASE Right    COLONOSCOPY     COLONOSCOPY N/A 12/13/2015   Procedure: COLONOSCOPY;  Surgeon: Malissa Hippo, MD;  Location: [facility];  Service: Endoscopy;  Laterality: N/A;  9:55   Fatty tumor     2011 (left arm)   FOOT SURGERY     hammertoe   TONSILLECTOMY     TOTAL ABDOMINAL HYSTERECTOMY     2003   Social History   Occupational History   Occupation: retired  Tobacco Use   Smoking status: Some Days    Packs/day: 0.50    Years: 33.00    Additional pack years: 0.00    Total pack years: 16.50    Types: Cigarettes    Last attempt to quit: 2013    Years since quitting: 11.4   Smokeless tobacco: Never  Vaping Use   Vaping Use: Never used  Substance and Sexual Activity   Alcohol use: No    Alcohol/week: 0.0 standard drinks of alcohol   Drug use: No   Sexual activity: Not Currently

## 2023-03-23 ENCOUNTER — Telehealth: Payer: Self-pay | Admitting: Psychiatry

## 2023-03-23 ENCOUNTER — Ambulatory Visit (HOSPITAL_COMMUNITY)
Admission: RE | Admit: 2023-03-23 | Discharge: 2023-03-23 | Disposition: A | Discharge status: Home / Self Care | Payer: Medicare HMO | Source: Ambulatory Visit | Attending: Surgical | Admitting: Surgical

## 2023-03-23 ENCOUNTER — Other Ambulatory Visit (HOSPITAL_COMMUNITY): Payer: Self-pay

## 2023-03-23 DIAGNOSIS — M545 Low back pain, unspecified: Secondary | ICD-10-CM | POA: Insufficient documentation

## 2023-03-23 NOTE — Telephone Encounter (Signed)
Pt stated she is following up on CT of neck. Pt requesting a call back

## 2023-03-26 ENCOUNTER — Other Ambulatory Visit (HOSPITAL_COMMUNITY): Payer: Self-pay

## 2023-03-26 NOTE — Telephone Encounter (Signed)
Called pt back. She already completed CT head/neck 11/2022. She verbalized understanding. She just wanted to make sure she completed tests that Dr. Delena Bali ordered.  She asked when next appt was. Advised 04/24/23 at 9am w/ Dr. Delena Bali.

## 2023-03-26 NOTE — Telephone Encounter (Signed)
She had a CTA head and neck done in February. It looks like they just didn't use the CTA neck order that Dr. Delena Bali put in in January.

## 2023-03-29 ENCOUNTER — Ambulatory Visit (HOSPITAL_COMMUNITY): Payer: Medicare HMO

## 2023-03-29 ENCOUNTER — Ambulatory Visit (HOSPITAL_COMMUNITY): Admission: RE | Admit: 2023-03-29 | Payer: Medicare HMO | Source: Ambulatory Visit

## 2023-03-29 ENCOUNTER — Telehealth: Payer: Self-pay | Admitting: Surgical

## 2023-03-29 NOTE — Telephone Encounter (Signed)
Pt called requesting a call from [facility] for MRI review. Please call pt at [phone].

## 2023-03-29 NOTE — Telephone Encounter (Signed)
Been waiting for scan to be read

## 2023-04-01 ENCOUNTER — Ambulatory Visit (HOSPITAL_COMMUNITY)
Admission: RE | Admit: 2023-04-01 | Discharge: 2023-04-01 | Disposition: A | Discharge status: Home / Self Care | Payer: Medicare HMO | Source: Ambulatory Visit | Attending: Surgical | Admitting: Surgical

## 2023-04-01 DIAGNOSIS — M545 Low back pain, unspecified: Secondary | ICD-10-CM | POA: Insufficient documentation

## 2023-04-09 ENCOUNTER — Telehealth: Payer: Self-pay | Admitting: Orthopedic Surgery

## 2023-04-09 ENCOUNTER — Encounter: Payer: Self-pay | Admitting: Physician Assistant

## 2023-04-09 NOTE — Progress Notes (Signed)
Notified by orthopedist Leonette Most L. Magnant, PA-C) that patient had recent MRI spine on 03/23/2023 that showed decreased MRI signal in liver and spleen, raising possibility of hemosiderosis.  From hematology standpoint, this is suspected to be false positive due to recent IV iron (Venofer 300 mg x 4 from 02/12/2023 through 03/09/2023).  Overall, patient has been in and iron deficient state with labs from 02/02/2023 showing ferritin 44, iron saturation 9%, and serum iron 28.  Message sent to patient's gastroenterologist (Dr. Sherilyn Cooter L. Danis) as well.  Carnella Guadalajara, PA-C 04/09/23 4:18 PM

## 2023-04-09 NOTE — Telephone Encounter (Signed)
Pt called requesting to submit to insurance for right knee gel injection. Please call pt when approved. Pt phone [phone].

## 2023-04-10 ENCOUNTER — Inpatient Hospital Stay: Payer: Medicare HMO | Attending: Hematology

## 2023-04-10 ENCOUNTER — Other Ambulatory Visit: Payer: Self-pay

## 2023-04-10 DIAGNOSIS — D509 Iron deficiency anemia, unspecified: Secondary | ICD-10-CM | POA: Insufficient documentation

## 2023-04-10 DIAGNOSIS — D500 Iron deficiency anemia secondary to blood loss (chronic): Secondary | ICD-10-CM

## 2023-04-10 DIAGNOSIS — E46 Unspecified protein-calorie malnutrition: Secondary | ICD-10-CM

## 2023-04-10 LAB — CBC WITH DIFFERENTIAL/PLATELET
Abs Immature Granulocytes: 0.03 10*3/uL (ref 0.00–0.07)
Basophils Absolute: 0.1 10*3/uL (ref 0.0–0.1)
Basophils Relative: 1 %
Eosinophils Absolute: 0.1 10*3/uL (ref 0.0–0.5)
Eosinophils Relative: 2 %
HCT: 33.4 % — ABNORMAL LOW (ref 36.0–46.0)
Hemoglobin: 11 g/dL — ABNORMAL LOW (ref 12.0–15.0)
Immature Granulocytes: 0 %
Lymphocytes Relative: 20 %
Lymphs Abs: 1.7 10*3/uL (ref 0.7–4.0)
MCH: 28.8 pg (ref 26.0–34.0)
MCHC: 32.9 g/dL (ref 30.0–36.0)
MCV: 87.4 fL (ref 80.0–100.0)
Monocytes Absolute: 0.4 10*3/uL (ref 0.1–1.0)
Monocytes Relative: 5 %
Neutro Abs: 6.2 10*3/uL (ref 1.7–7.7)
Neutrophils Relative %: 72 %
Platelets: 320 10*3/uL (ref 150–400)
RBC: 3.82 MIL/uL — ABNORMAL LOW (ref 3.87–5.11)
RDW: 16.7 % — ABNORMAL HIGH (ref 11.5–15.5)
WBC: 8.5 10*3/uL (ref 4.0–10.5)
nRBC: 0 % (ref 0.0–0.2)

## 2023-04-10 LAB — IRON AND TIBC
Iron: 31 ug/dL (ref 28–170)
Saturation Ratios: 18 % (ref 10.4–31.8)
TIBC: 175 ug/dL — ABNORMAL LOW (ref 250–450)
UIBC: 144 ug/dL

## 2023-04-10 LAB — FERRITIN: Ferritin: 368 ng/mL — ABNORMAL HIGH (ref 11–307)

## 2023-04-11 ENCOUNTER — Other Ambulatory Visit: Payer: Self-pay | Admitting: Gastroenterology

## 2023-04-11 DIAGNOSIS — R109 Unspecified abdominal pain: Secondary | ICD-10-CM

## 2023-04-11 NOTE — Telephone Encounter (Signed)
VOB submitted for Monovisc, right knee. Next gel injection would need to be after 04/25/2023.

## 2023-04-13 ENCOUNTER — Ambulatory Visit: Payer: Medicare HMO | Admitting: Surgical

## 2023-04-16 ENCOUNTER — Ambulatory Visit (HOSPITAL_COMMUNITY)
Admission: RE | Admit: 2023-04-16 | Discharge: 2023-04-16 | Disposition: A | Discharge status: Home / Self Care | Payer: Medicare HMO | Source: Ambulatory Visit | Attending: Internal Medicine | Admitting: Internal Medicine

## 2023-04-16 DIAGNOSIS — M0579 Rheumatoid arthritis with rheumatoid factor of multiple sites without organ or systems involvement: Secondary | ICD-10-CM | POA: Diagnosis present

## 2023-04-16 DIAGNOSIS — E559 Vitamin D deficiency, unspecified: Secondary | ICD-10-CM | POA: Insufficient documentation

## 2023-04-16 DIAGNOSIS — M810 Age-related osteoporosis without current pathological fracture: Secondary | ICD-10-CM | POA: Insufficient documentation

## 2023-04-16 NOTE — Progress Notes (Unsigned)
VIRTUAL VISIT via TELEPHONE NOTE [facility]   I connected with Vanessa Lara  on 04/17/23 at  12:00 PM by telephone and verified that I am speaking with the correct person using two identifiers.  Location: Patient: Home Provider: [facility]   I discussed the limitations, risks, security and privacy concerns of performing an evaluation and management service by telephone and the availability of in person appointments. I also discussed with the patient that there may be a patient responsible charge related to this service. The patient expressed understanding and agreed to proceed.  REASON FOR VISIT:  Follow-up for iron deficiency anemia and lymphadenopathy  CURRENT THERAPY: Intermittent Feraheme  INTERVAL HISTORY:   Vanessa Lara 67 y.o. female returns for routine follow-up of iron deficiency anemia and lymphadenopathy.  She was last seen by Rojelio Brenner PA-C on 02/08/2023.    At today's visit, she reports feeling poorly.  She continues to suffer from malnutrition, weight loss, severe fatigue, and general malaise.  She is seeing multiple specialists for workup and management of these chronic issues.    She felt no improvement after IV Venofer in May/June 2024.  She continues to deny any obvious rectal bleeding, melena, epistaxis, or other source of blood loss.  She denies any new lumps or bumps.  She reports that she feels cold all the time.   She denies any other B symptoms such as fever  or night sweats.  She remains severely fatigued.  No chest pain or hard time breathing.  She has occasional dizziness.  She has 25% energy and 40% appetite.  Her weight remains low but overall stable for the past year.  REVIEW OF SYSTEMS:   Review of Systems  Constitutional:  Negative for chills, diaphoresis and fever.  HENT:  Negative for nosebleeds.   Respiratory:  Positive for shortness of breath (with exertion). Negative for cough and hemoptysis.   Cardiovascular:   Positive for palpitations. Negative for chest pain.  Gastrointestinal:  Positive for abdominal pain, constipation, diarrhea and nausea. Negative for blood in stool and vomiting.  Genitourinary:  Negative for hematuria.  Skin: Negative.   Neurological:  Positive for dizziness and headaches.  Endo/Heme/Allergies:  Does not bruise/bleed easily.  Psychiatric/Behavioral:  The patient is nervous/anxious.      PHYSICAL EXAM: (per limitations of virtual telephone visit)  The patient is alert and oriented x 3, exhibiting adequate mentation, good mood, and ability to speak in full sentences and execute sound judgement.  ASSESSMENT & PLAN:  1.   Normocytic anemia: Anemia of chronic disease +/- iron deficiency - History of significant iron deficiency with mild anemia (Hgb ranging from 11.0-12.0) - EGD (11/26/2020): Erythematous mucosa in gastric body, mildly ringed appearance in the esophagus, normal examined duodenum - Capsule endoscopy (02/08/2021): Complete capsule endoscopy with no findings to explain patient's symptoms - Colonoscopy (08/12/2020): Nonbleeding external and internal hemorrhoids, diverticulosis - CT enterography abdomen/pelvis (01/04/2021): No abnormal findings to explain chronic iron deficiency - We reviewed her prior anemia work-up which showed normal B12, folic acid and methylmalonic acid levels.  Copper levels were normal.  SPEP was negative. - Most recent IV iron with Venofer 300 mg x 4 in May/June 2024 - No bright red blood per rectum or melena.  She was Hemoccult stool POSITIVE x 3. - Evaluated by GI (03/08/2023) and recommended for colonoscopy, EGD, capsule study, which she refused.   - She has severe chronic fatigue, which is multifactorial - Most recent labs (04/10/2023):  Hgb 11.0/MCV 87.4, ferritin 368, iron saturation 9%. (Previous labs from May 2024 showed normal B12 628, normal MMA, normal folate, normal copper, normal vitamin D, creatinine) - Differential diagnosis favors  anemia of chronic disease with functional iron deficiency, occult GI blood loss, and likely malabsorption secondary to chronic inflammation. -- She started Plaquenil in June 2024 - PLAN: Hemoglobin improved.  Iron studies at goal. - Labs and RTC in 3 months  - Patient requests that her labs and office note today be sent to her orthopedist (Dr. Lajoyce Corners), as she reports he is considering surgery once her hemoglobin is >10.0  2.  Weight loss and malnutrition - Patient has had intermittent episodes of weight loss since 2020 - She follows with her PCP, GI, rheumatology, and endocrinology for severe IBS and weight loss. - She is up-to-date on routine cancer screening such as colonoscopy and mammogram. - She does not have any clear focal symptoms that would require imaging at this time. - Due to strong family history of malignancy, she was previously recommended for [facility] testing, but this was not covered by insurance and was cost prohibitive. - She has had extensive previous imaging including PET scan in October 2021, all of which has been negative for malignancy - Weight loss suspected to be due to her underlying inflammatory condition - She is taking Marinol as prescribed by PCP - Weight today is overall stable within the same 5 pound range for the past year - PLAN: Weight loss and malnutrition is being managed by her PCP.  3.  Leukocytosis & thrombocytosis - Past CBCs reveal intermittent leukocytosis (neutrophil predominant) and thrombocytosis  - MPN work-up was negative for JAK2 V617F, JAK2 exon 12, MPL and CALR.  Reflex testing to NGS did not reveal any abnormal mutations. - BCR/ABL FISH was negative - Inflammatory markers (08/23/2020) significantly elevated with ESR 62 and CRP 1.7.  LDH was normal at 157. - Findings are consistent with chronic inflammation and reactive leukocytosis/thrombocytosis - No indication for bone marrow biopsy or further work-up at this time - Most recent CBC  (04/10/2023) with normal WBC and platelets - PLAN: Reactive leukocytosis/thrombocytosis, without indication for bone marrow biopsy or further work-up/treatment at this time.  4.  PET positive right axillary lymph node - PET scan obtained 07/09/2020 due to unintentional weight loss showed mildly hypermetabolic right axillary lymph node - Right axillary lymph node was palpable on exam (November 2021) - She has completed 5 days of azithromycin as she had a history of cat bite. - She was advised by Dr. Leonides Schanz that her lymph node was likely reactive and related to her underlying inflammatory disease, but patient insisted on biopsy. - Patient opted for right axillary lymph node biopsy (10/12/2020), which showed benign reactive lymph node, no malignancy   5.  PET positive ileocecal lesion - PET scan obtained 07/09/2020 for unintentional weight loss showed accentuated activity along the distalmost terminal ileum and ileocecal valve, possibly physiologic since there was no mass or specific abnormality in this region on diagnostic CT as of 06/23/2020. - Patient had colonoscopy on 08/12/2020, in which the cecum and IC valvle appeared normal. Terminal ileum could not be intubated due to restricted sigmoid and redundant colon. - CT abdomen/pelvis with contrast (01/04/2021): No abnormal bowel wall thickening or abnormal mural enhancement in the stomach, small bowel, or colon.  No findings to suggest inflammatory or chronic fibrous small bowel stricture.  No mesenteric or intraperitoneal free fluid.  Terminal ileum was normal, no edema or inflammation  in the region of the cecum.   6.  Osteoporosis: - DEXA scan on 04/13/2020 shows T score -4.2. - She follows with endocrinology and orthopedics   7.  Rheumatoid arthritis - She follows with Dr. Dierdre Forth / Azucena Fallen of [facility]  PLAN SUMMARY: >> Labs in 3 months = CBC/D, ferritin, iron/TIBC >> OFFICE visit in 3 months     I discussed the assessment  and treatment plan with the patient. The patient was provided an opportunity to ask questions and all were answered. The patient agreed with the plan and demonstrated an understanding of the instructions.   The patient was advised to call back or seek an in-person evaluation if the symptoms worsen or if the condition fails to improve as anticipated.  I provided 22 minutes of non-face-to-face time during this encounter.  Carnella Guadalajara, PA-C 04/17/23 12:28 PM

## 2023-04-17 ENCOUNTER — Ambulatory Visit: Payer: Medicare HMO | Admitting: Physician Assistant

## 2023-04-17 ENCOUNTER — Telehealth: Payer: Self-pay | Admitting: Surgical

## 2023-04-17 ENCOUNTER — Inpatient Hospital Stay (HOSPITAL_BASED_OUTPATIENT_CLINIC_OR_DEPARTMENT_OTHER): Payer: Medicare HMO | Admitting: Physician Assistant

## 2023-04-17 ENCOUNTER — Other Ambulatory Visit: Payer: Self-pay

## 2023-04-17 DIAGNOSIS — D500 Iron deficiency anemia secondary to blood loss (chronic): Secondary | ICD-10-CM

## 2023-04-17 DIAGNOSIS — M0579 Rheumatoid arthritis with rheumatoid factor of multiple sites without organ or systems involvement: Secondary | ICD-10-CM

## 2023-04-17 NOTE — Telephone Encounter (Signed)
Patient called asked if she can get referred to a provider for her back. Patient said she has an appointment tomorrow. With [employer].  The number to contact patient is [phone]

## 2023-04-17 NOTE — Telephone Encounter (Signed)
Patient called and states she has noticed improvement on the plaquenil 1/2 tablet daily and would like to know if her dose could possibly be increased to a full tablet. Patient states she thinks she will notice more improvement. I did advise patient that the dose is calculated on height/weight but that I would definitely send a message to Dr. Dimple Casey to advise on any possible dose change.  Please advise.

## 2023-04-18 ENCOUNTER — Ambulatory Visit (INDEPENDENT_AMBULATORY_CARE_PROVIDER_SITE_OTHER): Payer: Medicare HMO | Admitting: Surgical

## 2023-04-18 DIAGNOSIS — M170 Bilateral primary osteoarthritis of knee: Secondary | ICD-10-CM

## 2023-04-18 DIAGNOSIS — M1712 Unilateral primary osteoarthritis, left knee: Secondary | ICD-10-CM

## 2023-04-18 DIAGNOSIS — G8929 Other chronic pain: Secondary | ICD-10-CM

## 2023-04-18 DIAGNOSIS — M25512 Pain in left shoulder: Secondary | ICD-10-CM

## 2023-04-18 DIAGNOSIS — M1711 Unilateral primary osteoarthritis, right knee: Secondary | ICD-10-CM

## 2023-04-18 MED ORDER — HYDROXYCHLOROQUINE SULFATE 200 MG PO TABS: ORAL_TABLET | ORAL | Status: DC

## 2023-04-18 NOTE — Telephone Encounter (Signed)
Increasing to 1 full tablet daily would be too high for her body mass. If she wanted to increase slightly could take 1 tablet Monday-Friday and skip weekends would average out to a safe amount and would be almost a 50% increase. Hydroxychloroquine stays in your system for a long time so is safe to take this way.

## 2023-04-18 NOTE — Telephone Encounter (Signed)
Patient advised Increasing to 1 full tablet daily would be too high for her body mass. If she wanted to increase slightly could take 1 tablet Monday-Friday and skip weekends would average out to a safe amount and would be almost a 50% increase. Hydroxychloroquine stays in your system for a long time so is safe to take this way. Patient verbalized understanding.   Patient states she will start taking the Hydroxychloroquine 1 tablet Monday-Friday and skip weekends.

## 2023-04-19 ENCOUNTER — Telehealth: Payer: Self-pay | Admitting: Surgical

## 2023-04-19 ENCOUNTER — Encounter: Payer: Self-pay | Admitting: Surgical

## 2023-04-19 MED ORDER — BUPIVACAINE HCL 0.25 % IJ SOLN
4.0000 mL | INTRAMUSCULAR | Status: AC | PRN
Start: 2023-04-18 — End: 2023-04-18
  Administered 2023-04-18: 4 mL via INTRA_ARTICULAR

## 2023-04-19 MED ORDER — METHYLPREDNISOLONE ACETATE 40 MG/ML IJ SUSP
40.0000 mg | INTRAMUSCULAR | Status: AC | PRN
Start: 2023-04-18 — End: 2023-04-18
  Administered 2023-04-18: 40 mg via INTRA_ARTICULAR

## 2023-04-19 MED ORDER — LIDOCAINE HCL 1 % IJ SOLN
5.0000 mL | INTRAMUSCULAR | Status: AC | PRN
Start: 2023-04-18 — End: 2023-04-18
  Administered 2023-04-18: 5 mL

## 2023-04-19 NOTE — Telephone Encounter (Signed)
Patient called asked if she was approved for the gel injection? The number to contact patient is [phone]

## 2023-04-19 NOTE — Progress Notes (Signed)
   Procedure Note  Patient: Vanessa Lara             Date of Birth: 30-Jun-1956           MRN: [id number]             Visit Date: 04/18/2023  Procedures: Visit Diagnoses:  1. Primary osteoarthritis of left knee   2. Chronic left shoulder pain   3. Primary osteoarthritis of right knee     Large Joint Inj: bilateral knee on 04/18/2023 11:44 AM Indications: diagnostic evaluation, joint swelling and pain Details: 18 G 1.5 in needle, superolateral approach  Arthrogram: No  Medications (Right): 5 mL lidocaine 1 %; 4 mL bupivacaine 0.25 %; 40 mg methylPREDNISolone acetate 40 MG/ML Medications (Left): 5 mL lidocaine 1 %; 4 mL bupivacaine 0.25 %; 40 mg methylPREDNISolone acetate 40 MG/ML Outcome: tolerated well, no immediate complications  Patient returns for bilateral knee cortisone injections.  Will plan to follow these up with gel injections in about 2 months.  Additionally, complains of continued right thoracic back pain.  Her thoracic MRI was reviewed with her demonstrating no significant pathology to explain her pain.  Most of her pain seems to be around the shoulder blade and reproduced with palpation underneath the shoulder blade.  She also has some mild to moderate crepitus with scapulothoracic motion.  Plan to refer patient to Dr. Shon Baton for his evaluation and consideration of right-sided scapulothoracic injection diagnostically. Procedure, treatment alternatives, risks and benefits explained, specific risks discussed. Consent was given by the patient. Immediately prior to procedure a time out was called to verify the correct patient, procedure, equipment, support staff and site/side marked as required. Patient was prepped and draped in the usual sterile fashion.

## 2023-04-19 NOTE — Telephone Encounter (Addendum)
Patient called advised she had a Bone Density teat last Monday at [facility]. Patient said she need Franky Macho to see the results and go over the results were. Patient said she need to know what the number is. Patient said the prednisone injection are not working.   The number to contact patient is [phone]

## 2023-04-20 ENCOUNTER — Other Ambulatory Visit: Payer: Self-pay

## 2023-04-20 ENCOUNTER — Other Ambulatory Visit: Payer: Self-pay | Admitting: Internal Medicine

## 2023-04-20 DIAGNOSIS — M1711 Unilateral primary osteoarthritis, right knee: Secondary | ICD-10-CM

## 2023-04-20 NOTE — Telephone Encounter (Signed)
Tried to call patient. VM full. I will try again monday

## 2023-04-20 NOTE — Telephone Encounter (Signed)
Last Fill: 03/10/2023  Eye exam: not on file   Labs: 04/10/2023 CBC RBC 3.82 Hemoglobin 11.0 HCT 33.4 RDW 16.7 02/02/2023 CMP  Sodium 133 Potassium 3.2 Chloride 97 Calcium 8.8 Albumin 3.4 Alkaline Phosphatase 127 Total Bilirubin 0.2  Next Visit: 06/06/2023  Last Visit: 03/08/2023  DX: Rheumatoid arthritis involving multiple sites with positive rheumatoid factor   Current Dose per office note 03/08/2023: hydroxychloroquine at 100 mg daily   Had does change since last office visit. Refer to phone call note on 04/17/2023. Please check medication to ensure correct sig.   Contacted the patient regarding her PLQ eye exam and patient states she has not scheduled her exam yet and I advised her to schedule one as soon as possible.   Okay to refill Plaquenil?

## 2023-04-20 NOTE — Telephone Encounter (Signed)
Talked with patient and appointment has been scheduled for gel injection with Franky Macho

## 2023-04-23 NOTE — Telephone Encounter (Signed)
I called and discussed results of bone density scan.  Also discussed her knees are doing better since injections. She is awaiting call about inj with dr Shon Baton, told her we will call later this week

## 2023-04-23 NOTE — Progress Notes (Deleted)
   CC:  headaches  Follow-up Visit  Last visit: 08/07/22  Brief HPI: 67 year old female with a history of Rheumatoid arthritis, B12 deficiency, HLD who follows in clinic for daily left-sided headaches. MRI C-spine showed moderate left and severe right foraminal stenosis at C5-6. MRI brain 10/17/22 showed a punctate remote infarct in the left corona radiata. CTA head/neck 11/15/22 was negative for significant stenosis/occlusion.   Interval History: ***  Headaches***botox***naratriptan***  Prior Therapies                                  Prevention: Indomethacin (up to 75 mg TID) - lack of efficacy Gabapentin - dizziness Lyrica 25 mg BID - helps, drowsiness at higher doses Paxil Metoprolol Cymbalta 20 mg daily   Rescue: Tramadol - drowsiness Tizanidine Baclofen Meloxicam  Physical Exam:   Vital Signs: There were no vitals taken for this visit. GENERAL:  well appearing, in no acute distress, alert  SKIN:  Color, texture, turgor normal. No rashes or lesions HEAD:  Normocephalic/atraumatic. RESP: normal respiratory effort MSK:  No gross joint deformities.   NEUROLOGICAL: Mental Status: Alert, oriented to person, place and time, Follows commands, and Speech fluent and appropriate. Cranial Nerves: PERRL, face symmetric, no dysarthria, hearing grossly intact Motor: moves all extremities equally Gait: normal-based.  IMPRESSION: ***  PLAN: ***   Follow-up: ***  I spent a total of *** minutes on the date of the service. Discussed medication side effects, adverse reactions and drug interactions. Written educational materials and patient instructions outlining all of the above were given.  Ocie Doyne, MD

## 2023-04-24 ENCOUNTER — Telehealth: Payer: Self-pay | Admitting: Sports Medicine

## 2023-04-24 ENCOUNTER — Ambulatory Visit: Payer: Medicare HMO | Admitting: Psychiatry

## 2023-04-24 ENCOUNTER — Encounter: Payer: Self-pay | Admitting: Psychiatry

## 2023-04-24 NOTE — Telephone Encounter (Signed)
Called patient Put her on the schedule for Monday August 5th She would like to be called if something sooner opens up

## 2023-04-24 NOTE — Telephone Encounter (Signed)
Patient called. Says she has a referral to see Dr. Shon Baton for an injection. Waiting on an appointment. [phone].

## 2023-04-30 ENCOUNTER — Other Ambulatory Visit: Payer: Self-pay | Admitting: Internal Medicine

## 2023-04-30 ENCOUNTER — Telehealth: Payer: Self-pay | Admitting: *Deleted

## 2023-04-30 ENCOUNTER — Other Ambulatory Visit: Payer: Self-pay | Admitting: *Deleted

## 2023-04-30 NOTE — Telephone Encounter (Signed)
Patient left message, patient requesting to take 1/2 pill of PLQ,patient, patient cannot swallow whole pill.

## 2023-04-30 NOTE — Telephone Encounter (Signed)
She can go back to taking 1/2 pill daily would be easiest adjustment.

## 2023-04-30 NOTE — Telephone Encounter (Signed)
I called patient, patient verbalized understanding, patient wants new RX with new Sig sent to pharmacy, pending Dr. Gregary Cromer approval.

## 2023-05-01 MED ORDER — HYDROXYCHLOROQUINE SULFATE 200 MG PO TABS: ORAL_TABLET | ORAL | 2 refills | Status: DC

## 2023-05-02 ENCOUNTER — Ambulatory Visit (INDEPENDENT_AMBULATORY_CARE_PROVIDER_SITE_OTHER): Payer: Medicare HMO | Admitting: Surgical

## 2023-05-02 ENCOUNTER — Other Ambulatory Visit (INDEPENDENT_AMBULATORY_CARE_PROVIDER_SITE_OTHER): Payer: Medicare HMO

## 2023-05-02 DIAGNOSIS — M19071 Primary osteoarthritis, right ankle and foot: Secondary | ICD-10-CM | POA: Diagnosis not present

## 2023-05-02 DIAGNOSIS — M25571 Pain in right ankle and joints of right foot: Secondary | ICD-10-CM

## 2023-05-02 DIAGNOSIS — M1711 Unilateral primary osteoarthritis, right knee: Secondary | ICD-10-CM

## 2023-05-03 ENCOUNTER — Encounter: Payer: Self-pay | Admitting: Surgical

## 2023-05-03 MED ORDER — LIDOCAINE HCL 1 % IJ SOLN
5.0000 mL | INTRAMUSCULAR | Status: AC | PRN
Start: 2023-05-02 — End: 2023-05-02
  Administered 2023-05-02: 5 mL

## 2023-05-03 MED ORDER — BUPIVACAINE HCL 0.5 % IJ SOLN
1.0000 mL | INTRAMUSCULAR | Status: AC | PRN
Start: 2023-05-02 — End: 2023-05-02
  Administered 2023-05-02: 1 mL via INTRA_ARTICULAR

## 2023-05-03 MED ORDER — LIDOCAINE HCL 1 % IJ SOLN
3.0000 mL | INTRAMUSCULAR | Status: AC | PRN
Start: 2023-05-02 — End: 2023-05-02
  Administered 2023-05-02: 3 mL

## 2023-05-03 MED ORDER — METHYLPREDNISOLONE ACETATE 40 MG/ML IJ SUSP
40.0000 mg | INTRAMUSCULAR | Status: AC | PRN
Start: 2023-05-02 — End: 2023-05-02
  Administered 2023-05-02: 40 mg via INTRA_ARTICULAR

## 2023-05-03 MED ORDER — HYALURONAN 88 MG/4ML IX SOSY
88.0000 mg | PREFILLED_SYRINGE | INTRA_ARTICULAR | Status: AC | PRN
Start: 2023-05-02 — End: 2023-05-02
  Administered 2023-05-02: 88 mg via INTRA_ARTICULAR

## 2023-05-03 NOTE — Progress Notes (Signed)
Office Visit Note   Patient: Vanessa Lara           Date of Birth: 09-12-1956           MRN: [id number] Visit Date: 05/02/2023 Requested by: [name], NP [address] [place],  [place] [address] PCP: [name], NP  Subjective: Chief Complaint  Patient presents with   Right Knee - Pain    HPI: Vanessa Lara is a 67 y.o. female who presents to the office reporting right knee pain and right ankle pain.  She is here today for right knee Monovisc injection.  She also notes right ankle pain that has been increased in the last several weeks without any history of injury recently.  Describes pain in the anterior and posterior aspects of the ankle as well as some pain in the dorsal midfoot region.  She has a little bit of swelling in the ankle relative to the left.  No mechanical symptoms.  Never had surgery on her ankle.  Was planning to have surgery on her right foot with Dr. Lajoyce Corners but that had to be canceled due to her hemoglobin.  She states that her hemoglobin has increased recently to 11.0.  She would like to reschedule her surgery with Dr. Lajoyce Corners.              ROS: All systems reviewed are negative as they relate to the chief complaint within the history of present illness.  Patient denies fevers or chills.  Assessment & Plan: Visit Diagnoses:  1. Primary osteoarthritis of right knee   2. Pain in right ankle and joints of right foot   3. Arthritis of right ankle     Plan: Patient is a 67 year old female who presents for evaluation of right knee and right ankle pain.  Here today for right knee Monovisc injection.  This was administered and patient tolerated procedure well.  Regarding her right ankle pain, she has radiographs taken today demonstrating mild degenerative changes of the right ankle.  Most tender over the ankle joint line and after discussion of options, she would like to try cortisone injection into the right ankle.  This was administered and patient  tolerated procedure well without complication.  We will see how this does for her and she will let us know.  Also plan to reach out to Dr. Lajoyce Corners to see if he can get her back on the schedule now that her hemoglobin is in a better place.  Follow-up as needed.  Follow-Up Instructions: No follow-ups on file.   Orders:  Orders Placed This Encounter  Procedures   XR Ankle Complete Right   No orders of the defined types were placed in this encounter.     Procedures: Medium Joint Inj: R ankle on 05/02/2023 7:48 AM Indications: pain, joint swelling and diagnostic evaluation Details: 22 G 1.5 in needle, anteromedial approach Medications: 3 mL lidocaine 1 %; 1 mL bupivacaine 0.5 %; 40 mg methylPREDNISolone acetate 40 MG/ML (1/2 mL bupivacaine 0.5 %) Outcome: tolerated well, no immediate complications Procedure, treatment alternatives, risks and benefits explained, specific risks discussed. Consent was given by the patient. Immediately prior to procedure a time out was called to verify the correct patient, procedure, equipment, support staff and site/side marked as required. Patient was prepped and draped in the usual sterile fashion.    Large Joint Inj: R knee on 05/02/2023 7:49 AM Indications: pain, joint swelling and diagnostic evaluation Details: 18 G 1.5  in needle, superolateral approach  Arthrogram: No  Medications: 5 mL lidocaine 1 %; 88 mg Hyaluronan 88 MG/4ML Outcome: tolerated well, no immediate complications Procedure, treatment alternatives, risks and benefits explained, specific risks discussed. Consent was given by the patient. Immediately prior to procedure a time out was called to verify the correct patient, procedure, equipment, support staff and site/side marked as required. Patient was prepped and draped in the usual sterile fashion.       Clinical Data: No additional findings.  Objective: Vital Signs: There were no vitals taken for this visit.  Physical Exam:   Constitutional: Patient appears well-developed HEENT:  Head: Normocephalic Eyes:EOM are normal Neck: Normal range of motion Cardiovascular: Normal rate Pulmonary/chest: Effort normal Neurologic: Patient is alert Skin: Skin is warm Psychiatric: Patient has normal mood and affect  Ortho Exam: Ortho exam demonstrates right knee without any evidence of cellulitis or skin changes.  She has no effusion.  Mild to moderate tenderness over the medial and lateral joint lines.  She has 0 degrees extension and greater than 120 degrees of knee flexion.  No calf tenderness.  Negative Homans' sign.  No pain with hip range of motion.  Right ankle with intact ankle dorsiflexion complex flexion, inversion, eversion without weakness.  No cellulitis or skin changes noted of the right ankle.  She has tenderness moderately over the ankle joint line.  Achilles tendon is palpable and intact.  Palpable DP pulse.  Specialty Comments:  MRI CERVICAL SPINE WITHOUT CONTRAST   TECHNIQUE: Multiplanar, multisequence MR imaging of the cervical spine was performed. No intravenous contrast was administered.   COMPARISON:  None.   FINDINGS: Alignment: 2 mm retrolisthesis of C5 on C6.   Vertebrae: No acute fracture, evidence of discitis, or bone lesion.   Cord: Normal signal and morphology.   Posterior Fossa, vertebral arteries, paraspinal tissues: Posterior fossa demonstrates no focal abnormality. Vertebral artery flow voids are maintained. Paraspinal soft tissues are unremarkable.   Disc levels:   Discs: Degenerative disease with disc height loss at C4-5 and C5-6.   C2-3: No significant disc bulge. No neural foraminal stenosis. No central canal stenosis.   C3-4: Mild broad-based disc bulge. No foraminal or central canal stenosis.   C4-5: Mild broad-based disc bulge. No foraminal or central canal stenosis.   C5-6: Mild broad-based disc bulge. Bilateral uncovertebral degenerative changes. Moderate left  and severe right foraminal stenosis. No spinal stenosis.   C6-7: Mild broad-based disc bulge. No foraminal or central canal stenosis.   C7-T1: No significant disc bulge. No neural foraminal stenosis. No central canal stenosis.   IMPRESSION: 1. At C5-6 there is a mild broad-based disc bulge. Bilateral uncovertebral degenerative changes. Moderate left and severe right foraminal stenosis. 2. Otherwise, mild cervical spine spondylosis as described above.     Electronically Signed   By: Elige Ko M.D.   On: 12/16/2021 10:51  Imaging: No results found.   PMFS History: Patient Active Problem List   Diagnosis Date Noted   High risk medication use 01/16/2023   Medication monitoring encounter 12/05/2021   Physical deconditioning 11/11/2021   Elevated serum lactate dehydrogenase (LDH) 08/17/2021   Vitamin D deficiency 08/17/2021   Chronic fatigue 07/25/2021   Vitamin B12 deficiency 07/19/2021   Hardening of the aorta (main artery of the heart) (HCC) 06/21/2021   Solitary lung nodule 06/15/2021   Loss of weight 05/18/2021   Protein-calorie malnutrition, severe (HCC) 05/18/2021   Osteoporosis 06/15/2020   Iron deficiency anemia 04/09/2020   Primary osteoarthritis  involving multiple joints 12/31/2018   Rheumatoid arthritis involving multiple sites with positive rheumatoid factor (HCC) 12/31/2018   Irritable bowel syndrome with both constipation and diarrhea 10/28/2018   Constipation 03/14/2016   Mitral valve prolapse 11/23/2015   High cholesterol 11/23/2015   Past Medical History:  Diagnosis Date   Anemia    Anxiety    Arthritis    RA   Cancer (HCC)    Skin   CHF (congestive heart failure) (HCC)    Collagen vascular disease (HCC)    Depression    Endometriosis    High cholesterol    IBS (irritable bowel syndrome)    Lymphadenopathy    Mitral valve prolapse    RA (rheumatoid arthritis) (HCC)    Rotator cuff tear    bilateral   Stroke (HCC) 06/2022   Uterus,  adenomyosis     Family History  Problem Relation Age of Onset   Colon cancer Other    Bone cancer Mother    Brain cancer Father    Brain cancer Sister    Breast cancer Sister    Renal Disease Sister    Pancreatic cancer Neg Hx    Esophageal cancer Neg Hx    Stomach cancer Neg Hx    Rectal cancer Neg Hx     Past Surgical History:  Procedure Laterality Date   AXILLARY LYMPH NODE BIOPSY Right 10/12/2020   Procedure: RIGHT AXILLARY LYMPH NODE BIOPSY EXCISION;  Surgeon: Almond Lint, MD;  Location: [facility];  Service: General;  Laterality: Right;   BUNIONECTOMY Right    CARPAL TUNNEL RELEASE Right    COLONOSCOPY     COLONOSCOPY N/A 12/13/2015   Procedure: COLONOSCOPY;  Surgeon: Malissa Hippo, MD;  Location: [facility];  Service: Endoscopy;  Laterality: N/A;  9:55   Fatty tumor     2011 (left arm)   FOOT SURGERY     [email or web address]   TONSILLECTOMY     TOTAL ABDOMINAL HYSTERECTOMY     2003   Social History   Occupational History   Occupation: retired  Tobacco Use   Smoking status: Some Days    Current packs/day: 0.00    Average packs/day: 0.5 packs/day for 33.0 years (16.5 ttl pk-yrs)    Types: Cigarettes    Start date: 1    Last attempt to quit: 2013    Years since quitting: 11.5   Smokeless tobacco: Never  Vaping Use   Vaping status: Never Used  Substance and Sexual Activity   Alcohol use: No    Alcohol/week: 0.0 standard drinks of alcohol   Drug use: No   Sexual activity: Not Currently

## 2023-05-07 ENCOUNTER — Ambulatory Visit (INDEPENDENT_AMBULATORY_CARE_PROVIDER_SITE_OTHER): Payer: Medicare HMO | Admitting: Sports Medicine

## 2023-05-07 ENCOUNTER — Ambulatory Visit (INDEPENDENT_AMBULATORY_CARE_PROVIDER_SITE_OTHER): Payer: Medicare HMO | Admitting: Gastroenterology

## 2023-05-07 VITALS — BP 117/85 | HR 108 | Ht <= 58 in | Wt <= 1120 oz

## 2023-05-07 DIAGNOSIS — M546 Pain in thoracic spine: Secondary | ICD-10-CM | POA: Diagnosis not present

## 2023-05-07 DIAGNOSIS — M40204 Unspecified kyphosis, thoracic region: Secondary | ICD-10-CM | POA: Diagnosis not present

## 2023-05-07 DIAGNOSIS — M25511 Pain in right shoulder: Secondary | ICD-10-CM

## 2023-05-07 DIAGNOSIS — M7551 Bursitis of right shoulder: Secondary | ICD-10-CM

## 2023-05-07 DIAGNOSIS — G8929 Other chronic pain: Secondary | ICD-10-CM | POA: Diagnosis not present

## 2023-05-07 MED ORDER — LIDOCAINE HCL 1 % IJ SOLN
0.5000 mL | INTRAMUSCULAR | Status: AC | PRN
Start: 2023-05-07 — End: 2023-05-07
  Administered 2023-05-07: .5 mL

## 2023-05-07 MED ORDER — BUPIVACAINE HCL 0.25 % IJ SOLN
1.0000 mL | INTRAMUSCULAR | Status: AC | PRN
Start: 2023-05-07 — End: 2023-05-07
  Administered 2023-05-07: 1 mL via INTRA_ARTICULAR

## 2023-05-07 MED ORDER — LIDOCAINE HCL 1 % IJ SOLN
1.0000 mL | INTRAMUSCULAR | Status: AC | PRN
Start: 2023-05-07 — End: 2023-05-07
  Administered 2023-05-07: 1 mL

## 2023-05-07 MED ORDER — BUPIVACAINE HCL 0.25 % IJ SOLN
0.5000 mL | INTRAMUSCULAR | Status: AC | PRN
Start: 2023-05-07 — End: 2023-05-07
  Administered 2023-05-07: .5 mL

## 2023-05-07 MED ORDER — METHYLPREDNISOLONE ACETATE 40 MG/ML IJ SUSP
40.0000 mg | INTRAMUSCULAR | Status: AC | PRN
Start: 2023-05-07 — End: 2023-05-07
  Administered 2023-05-07: 40 mg via INTRA_ARTICULAR

## 2023-05-07 MED ORDER — METHYLPREDNISOLONE ACETATE 40 MG/ML IJ SUSP
20.0000 mg | INTRAMUSCULAR | Status: AC | PRN
Start: 2023-05-07 — End: 2023-05-07
  Administered 2023-05-07: 20 mg via INTRAMUSCULAR

## 2023-05-07 NOTE — Progress Notes (Signed)
Vanessa Lara - 67 y.o. female MRN [id number]  Date of birth: 10-Dec-1955  Office Visit Note: Visit Date: 05/07/2023 PCP: [name], NP Referred by: [name], NP  Subjective: No chief complaint on file.  HPI: Vanessa Lara is a pleasant 67 y.o. female who presents today for acute on chronic right scapular pain and mid-low back pain.  Vanessa Lara has had chronic right-sided posterior scapular pain for 9 months or more.  No specific injury.  She did have an MRI of the thoracic spine as well that did not show any significant abnormal pathology to explain her pain.   Having mid back and low back pain, worse on the right-hand side.  Does have a history of rheumatoid arthritis, managed on Plaquenil 200 mg daily.  She also has a history of diminished bone density, history of Fosamax use.  Pertinent ROS were reviewed with the patient and found to be negative unless otherwise specified above in HPI.   Assessment & Plan: Visit Diagnoses:  1. Scapulothoracic bursitis of right shoulder   2. Midline low back pain, unspecified chronicity, unspecified whether sciatica present   3. Chronic right shoulder pain   4. Kyphosis of thoracic region, unspecified kyphosis type   5. Trigger point of thoracic region    Plan: Asked with [name] the nature of her back and her right scapular pain.  I do think she has a degree of scapulothoracic bursitis.  There is not winging of the scapula but there is some right-sided scapular dysfunction.  Through shared decision-making, did proceed with right shoulder scapulothoracic bursa injection, patient tolerated well.  Risk/benefits/indications and possible side effects were discussed with the patient, she is understanding and agreeable.  She also has some trigger points of the mid and lower thoracic right-sided paraspinal musculature.  We did perform 2 trigger point injections which she tolerated well.  I do think that degree of her pain is from scapular dyskinesia  as well as her exaggerated thoracic kyphosis.  Discussed the nature of physical therapy referral for the right scapula and to work on her posterior and low back postural muscles.  She has a follow-up with Franky Macho in 2 weeks, she can talk about it at that visit with him or I am happy to place his PT referral if she wishes to proceed.  She will continue her Plaquenil 200 mg once daily and Lyrica 25 mg twice daily as needed. F/u with me as needed. Can consider repeat ST-injection infrequently if this was helpful.  Follow-up: Return in about 3 months (around 08/07/2023) for ST evaluation and possible injection.   Meds & Orders: No orders of the defined types were placed in this encounter.   Orders Placed This Encounter  Procedures   Large Joint Inj: R glenohumeral   Trigger Point Inj     Procedures: Large Joint Inj: R glenohumeral on 05/07/2023 2:01 PM Details: 25 G 1.5 in needle, posterior approach Medications: 1 mL lidocaine 1 %; 1 mL bupivacaine 0.25 %; 40 mg methylPREDNISolone acetate 40 MG/ML Outcome: tolerated well, no immediate complications  Right-shoulder scapulothoracic bursa injection - technically successful injection. Patient in-prone position with needle from medial to lateral direction over inferior border of scapula.  Procedure, treatment alternatives, risks and benefits explained, specific risks discussed. Consent was given by the patient. Immediately prior to procedure a time out was called to verify the correct patient, procedure, equipment, support staff and site/side marked as required. Patient was prepped and draped in the usual sterile fashion.  Trigger Point Inj  Date/Time: 05/07/2023 2:05 PM  Performed by: Madelyn Brunner, DO Authorized by: Madelyn Brunner, DO   Consent Given by:  Patient Site marked: the procedure site was marked   Timeout: prior to procedure the correct patient, procedure, and site was verified   Indications:  Pain Total # of Trigger Points:  2 Location:  back   Needle Size:  27 G Approach:  Dorsal Medications #1:  0.5 mL lidocaine 1 %; 0.5 mL bupivacaine 0.25 %; 20 mg methylPREDNISolone acetate 40 MG/ML Medications #2:  0.5 mL lidocaine 1 %; 0.5 mL bupivacaine 0.25 % Patient tolerance:  Patient tolerated the procedure well with no immediate complications Comments: Procedure: Trigger point injections (2), right thoracic paraspinal musculature After discussion on R/B/I and informed verbal consent was obtained, a timeout was conducted. The patient was placed in a prone position on the examination table and the area of maximal tenderness was identified over the right thoracic paraspinal musculature.  This area was cleansed with Betadine and multiple alcohol swabs. Ethyl chloride was used for local anesthesia. Using a 27-gauge 1.0 inch needle the trigger point(s) was subsequently injected with a mixture of 0.5 cc of methylprednisolone 40 mg/mL and 1 cc of 1% lidocaine without epinephrine and 1 cc of bupivicaine 0.25%, with a total of 1.25 cc of injectate into each trigger point. A band-aid was applied following. Patient tolerated procedure well, there were no post-injection complications. Post-procedure instructions were given.         Clinical History: MRI CERVICAL SPINE WITHOUT CONTRAST   TECHNIQUE: Multiplanar, multisequence MR imaging of the cervical spine was performed. No intravenous contrast was administered.   COMPARISON:  None.   FINDINGS: Alignment: 2 mm retrolisthesis of C5 on C6.   Vertebrae: No acute fracture, evidence of discitis, or bone lesion.   Cord: Normal signal and morphology.   Posterior Fossa, vertebral arteries, paraspinal tissues: Posterior fossa demonstrates no focal abnormality. Vertebral artery flow voids are maintained. Paraspinal soft tissues are unremarkable.   Disc levels:   Discs: Degenerative disease with disc height loss at C4-5 and C5-6.   C2-3: No significant disc bulge. No neural foraminal  stenosis. No central canal stenosis.   C3-4: Mild broad-based disc bulge. No foraminal or central canal stenosis.   C4-5: Mild broad-based disc bulge. No foraminal or central canal stenosis.   C5-6: Mild broad-based disc bulge. Bilateral uncovertebral degenerative changes. Moderate left and severe right foraminal stenosis. No spinal stenosis.   C6-7: Mild broad-based disc bulge. No foraminal or central canal stenosis.   C7-T1: No significant disc bulge. No neural foraminal stenosis. No central canal stenosis.   IMPRESSION: 1. At C5-6 there is a mild broad-based disc bulge. Bilateral uncovertebral degenerative changes. Moderate left and severe right foraminal stenosis. 2. Otherwise, mild cervical spine spondylosis as described above.     Electronically Signed   By: Elige Ko M.D.   On: 12/16/2021 10:51  She reports that she has been smoking cigarettes. She started smoking about 44 years ago. She has a 16.5 pack-year smoking history. She has never used smokeless tobacco.  Recent Labs    11/02/22 1122  HGBA1C 5.1    Objective:   Vital Signs: BP 117/85   Pulse (!) 108   Ht 4\' 8"  (1.422 m)   Wt 69 lb 9.6 oz (31.6 kg)   BMI 15.60 kg/m   Physical Exam  Gen: Well-appearing, in no acute distress; non-toxic CV: Well-perfused. Warm.  Resp: Breathing unlabored on room air;  no wheezing. Psych: Fluid speech in conversation; appropriate affect; normal thought process Neuro: Sensation intact throughout. No gross coordination deficits.   Ortho Exam - Right scapula: Positive TTP over the medial and inferior aspect of the scapular border.  There is some mild scapular dyskinesia without winging.  There is some crepitus taking it through range of motion.  - Thoracic spine: There is generalized TTP from the mid to lower thoracic paraspinal musculature.  No midline spinous process TTP.  Pain is worse at endrange flexion and extension.  Positive trigger point in this  region.  Imaging:  -Independent review and interpretation of MRI thoracic spine from 03/23/2023 was performed by myself today.  MRI shows mild degenerative changes with an exaggerated kyphosis of the thoracic spine.  Intervertebral disc spaces are well-maintained, no acute fracture or otherwise acute bony abnormality noted.  Narrative & Impression  CLINICAL DATA:  67 year old female with persistent pain, worse on the right side. No known injury. No improvement with spinal epidural injection.   EXAM: MRI THORACIC SPINE WITHOUT CONTRAST   TECHNIQUE: Multiplanar, multisequence MR imaging of the thoracic spine was performed. No intravenous contrast was administered.   COMPARISON:  Cervical spine MRI 10/17/2022. Thoracic spine MRI 10/03/2018.   FINDINGS: Limited cervical spine imaging: New straightening, mild reversal of cervical lordosis but otherwise grossly stable since the January MRI.   Thoracic spine segmentation:  Appears to be normal.   Alignment: Normal to mildly exaggerated thoracic kyphosis, not significantly changed from 2020. Subtle levoconvex thoracic scoliosis. No spondylolisthesis.   Vertebrae: Maintained thoracic vertebral height. Visualized bone marrow signal is within normal limits. No marrow edema or evidence of acute osseous abnormality. Posterior ribs appear grossly negative.   Cord: Normal. Capacious thoracic spinal canal. Normal conus medullaris at T12-L1.   Paraspinal and other soft tissues: Decreased MRI signal in the visible liver and spleen, but visible pancreatic parenchyma seems spared (series 20, image 39). Negative visible other upper abdominal viscera (small simple right upper pole renal cyst - no follow-up imaging recommended). Negative visible chest. Thoracic paraspinal soft tissues appear unremarkable.   Disc levels:   Redemonstration of Capacious spinal canal with no age advanced or significant thoracic disc degeneration. No thoracic  spinal or foraminal stenosis.   IMPRESSION: 1. Normal for age MRI appearance of the Thoracic Spine. 2. Decreased MRI signal in the visible liver and spleen, raising possibility of Hemosiderosis.     Electronically Signed   By: Odessa Fleming M.D.   On: 03/31/2023 07:38     Past Medical/Family/Surgical/Social History: Medications & Allergies reviewed per EMR, new medications updated. Patient Active Problem List   Diagnosis Date Noted   High risk medication use 01/16/2023   Medication monitoring encounter 12/05/2021   Physical deconditioning 11/11/2021   Elevated serum lactate dehydrogenase (LDH) 08/17/2021   Vitamin D deficiency 08/17/2021   Chronic fatigue 07/25/2021   Vitamin B12 deficiency 07/19/2021   Hardening of the aorta (main artery of the heart) (HCC) 06/21/2021   Solitary lung nodule 06/15/2021   Loss of weight 05/18/2021   Protein-calorie malnutrition, severe (HCC) 05/18/2021   Osteoporosis 06/15/2020   Iron deficiency anemia 04/09/2020   Primary osteoarthritis involving multiple joints 12/31/2018   Rheumatoid arthritis involving multiple sites with positive rheumatoid factor (HCC) 12/31/2018   Irritable bowel syndrome with both constipation and diarrhea 10/28/2018   Constipation 03/14/2016   Mitral valve prolapse 11/23/2015   High cholesterol 11/23/2015   Past Medical History:  Diagnosis Date   Anemia  Anxiety    Arthritis    RA   Cancer (HCC)    Skin   CHF (congestive heart failure) (HCC)    Collagen vascular disease (HCC)    Depression    Endometriosis    High cholesterol    IBS (irritable bowel syndrome)    Lymphadenopathy    Mitral valve prolapse    RA (rheumatoid arthritis) (HCC)    Rotator cuff tear    bilateral   Stroke (HCC) 06/2022   Uterus, adenomyosis    Family History  Problem Relation Age of Onset   Colon cancer Other    Bone cancer Mother    Brain cancer Father    Brain cancer Sister    Breast cancer Sister    Renal Disease  Sister    Pancreatic cancer Neg Hx    Esophageal cancer Neg Hx    Stomach cancer Neg Hx    Rectal cancer Neg Hx    Past Surgical History:  Procedure Laterality Date   AXILLARY LYMPH NODE BIOPSY Right 10/12/2020   Procedure: RIGHT AXILLARY LYMPH NODE BIOPSY EXCISION;  Surgeon: Almond Lint, MD;  Location: [facility];  Service: General;  Laterality: Right;   BUNIONECTOMY Right    CARPAL TUNNEL RELEASE Right    COLONOSCOPY     COLONOSCOPY N/A 12/13/2015   Procedure: COLONOSCOPY;  Surgeon: Malissa Hippo, MD;  [facility];  Service: Endoscopy;  Laterality: N/A;  9:55   Fatty tumor     2011 (left arm)   FOOT SURGERY     hammertoe   TONSILLECTOMY     TOTAL ABDOMINAL HYSTERECTOMY     2003   Social History   Occupational History   Occupation: retired  Tobacco Use   Smoking status: Some Days    Current packs/day: 0.00    Average packs/day: 0.5 packs/day for 33.0 years (16.5 ttl pk-yrs)    Types: Cigarettes    Start date: 39    Last attempt to quit: 2013    Years since quitting: 11.6   Smokeless tobacco: Never  Vaping Use   Vaping status: Never Used  Substance and Sexual Activity   Alcohol use: No    Alcohol/week: 0.0 standard drinks of alcohol   Drug use: No   Sexual activity: Not Currently

## 2023-05-09 ENCOUNTER — Other Ambulatory Visit: Payer: Self-pay

## 2023-05-09 ENCOUNTER — Ambulatory Visit (INDEPENDENT_AMBULATORY_CARE_PROVIDER_SITE_OTHER): Payer: Medicare HMO | Admitting: Surgical

## 2023-05-09 ENCOUNTER — Telehealth: Payer: Self-pay | Admitting: Surgical

## 2023-05-09 DIAGNOSIS — M21942 Unspecified acquired deformity of hand, left hand: Secondary | ICD-10-CM | POA: Diagnosis not present

## 2023-05-09 DIAGNOSIS — M069 Rheumatoid arthritis, unspecified: Secondary | ICD-10-CM

## 2023-05-09 DIAGNOSIS — M79641 Pain in right hand: Secondary | ICD-10-CM | POA: Diagnosis not present

## 2023-05-09 NOTE — Telephone Encounter (Signed)
Vanessa Lara would like to know about getting her next set of injections set up she is wondering if they need to be cortisone or gel for Bil knee and her L hand, also how far apart do they need to be from Dr. Shon Baton injections she is set up for some future appt with Franky Macho and Shon Baton and she is unsure about the time frames for her injections, and would like to set up the appt

## 2023-05-10 ENCOUNTER — Ambulatory Visit: Payer: Medicare HMO | Admitting: Physician Assistant

## 2023-05-10 NOTE — Telephone Encounter (Signed)
IC advised patient. She verbalized understanding.

## 2023-05-10 NOTE — Telephone Encounter (Signed)
Next appt with me is on 06/22/23 and with brooks is on 08/07/23 so that should work fine. Can't repeat knee cortisone injections until mid-November

## 2023-05-12 ENCOUNTER — Encounter: Payer: Self-pay | Admitting: Surgical

## 2023-05-12 MED ORDER — METHYLPREDNISOLONE ACETATE 40 MG/ML IJ SUSP
13.3300 mg | INTRAMUSCULAR | Status: AC | PRN
Start: 2023-05-09 — End: 2023-05-09
  Administered 2023-05-09: 13.33 mg via INTRA_ARTICULAR

## 2023-05-12 MED ORDER — BUPIVACAINE HCL 0.25 % IJ SOLN
0.3300 mL | INTRAMUSCULAR | Status: AC | PRN
Start: 2023-05-09 — End: 2023-05-09
  Administered 2023-05-09: .33 mL via INTRA_ARTICULAR

## 2023-05-12 MED ORDER — LIDOCAINE HCL 1 % IJ SOLN
3.0000 mL | INTRAMUSCULAR | Status: AC | PRN
Start: 2023-05-09 — End: 2023-05-09
  Administered 2023-05-09: 3 mL

## 2023-05-12 NOTE — Progress Notes (Signed)
Office Visit Note   Patient: Vanessa Lara           Date of Birth: Dec 22, 1955           MRN: [id number] Visit Date: 05/09/2023 Requested by: [name], NP [address] [place],  [place] [address] PCP: [name], NP  Subjective: Chief Complaint  Patient presents with   Right Knee - Pain    HPI: Vanessa Lara is a 67 y.o. female who presents to the office reporting left hand pain.  Had recent right ankle injection and right knee injection on 05/02/2023 by myself with good relief.  Also had recent right-sided scapulothoracic bursa injection by Dr. Shon Baton for her right-sided scapular and upper back pain that has given her great relief.  Very satisfied with how she is feeling in regards to this.  However, now she complains of continued pain in the left hand.  She has history of rheumatoid arthritis that is controlled with Plaquenil.  She sees Dr. Sheliah Hatch.  She would like to try injection into her MCP joints to see if this will give her any relief.  She also has triggering that she has noticed of the left small finger.  This is not bothering her enough for any intervention today..                ROS: All systems reviewed are negative as they relate to the chief complaint within the history of present illness.  Patient denies fevers or chills.  Assessment & Plan: Visit Diagnoses:  1. Deformity of left hand due to rheumatoid arthritis (HCC)   2. Pain in right hand     Plan: Patient is a 67 year old female who presents for evaluation of left hand pain.  Here today to try MCP joint injection to see if this will give her some temporary relief of the rheumatoid arthritic pain she experiences.  On Plaquenil and follows with Dr. Dimple Casey.  Under ultrasound guidance, fifth and fourth MCP joints were injected with small volume injection.  Patient tolerated procedure well.  No complication.  Plan for patient to follow-up as needed.  She has had several cortisone injections  in the past month so any future injections would likely need to be deferred by at least a month.  Follow-Up Instructions: No follow-ups on file.   Orders:  Orders Placed This Encounter  Procedures   US Guided Needle Placement - No Linked Charges   No orders of the defined types were placed in this encounter.     Procedures: Small Joint Inj: L ring MCP on 05/09/2023 2:50 PM Indications: pain Details: 25 G needle, ultrasound-guided dorsal approach  Spinal Needle: No  Medications: 3 mL lidocaine 1 %; 0.33 mL bupivacaine 0.25 %; 13.33 mg methylPREDNISolone acetate 40 MG/ML Outcome: tolerated well, no immediate complications Procedure, treatment alternatives, risks and benefits explained, specific risks discussed. Consent was given by the patient. Immediately prior to procedure a time out was called to verify the correct patient, procedure, equipment, support staff and site/side marked as required. Patient was prepped and draped in the usual sterile fashion.    Small Joint Inj: L small MCP on 05/09/2023 2:51 PM Indications: pain Details: 25 G needle, ultrasound-guided ulnar approach  Spinal Needle: No  Medications: 3 mL lidocaine 1 %; 0.33 mL bupivacaine 0.25 %; 13.33 mg methylPREDNISolone acetate 40 MG/ML Outcome: tolerated well, no immediate complications Procedure, treatment alternatives, risks and benefits explained, specific risks discussed. Consent was  given by the patient. Immediately prior to procedure a time out was called to verify the correct patient, procedure, equipment, support staff and site/side marked as required. Patient was prepped and draped in the usual sterile fashion.       Clinical Data: No additional findings.  Objective: Vital Signs: There were no vitals taken for this visit.  Physical Exam:  Constitutional: Patient appears well-developed HEENT:  Head: Normocephalic Eyes:EOM are normal Neck: Normal range of motion Cardiovascular: Normal  rate Pulmonary/chest: Effort normal Neurologic: Patient is alert Skin: Skin is warm Psychiatric: Patient has normal mood and affect  Ortho Exam: Ortho exam demonstrates left hand with palpable radial pulse rated 2+.  She has significant deformity at all the MCP joints of the left hand.  She has moderate tenderness over the fourth and fifth MCP joint in particular.  No redness or drainage noted.  No streaking erythema.  Able to make full composite fist.  Finger extension intact.  Triggering noted of the left small finger.  Specialty Comments:  MRI CERVICAL SPINE WITHOUT CONTRAST   TECHNIQUE: Multiplanar, multisequence MR imaging of the cervical spine was performed. No intravenous contrast was administered.   COMPARISON:  None.   FINDINGS: Alignment: 2 mm retrolisthesis of C5 on C6.   Vertebrae: No acute fracture, evidence of discitis, or bone lesion.   Cord: Normal signal and morphology.   Posterior Fossa, vertebral arteries, paraspinal tissues: Posterior fossa demonstrates no focal abnormality. Vertebral artery flow voids are maintained. Paraspinal soft tissues are unremarkable.   Disc levels:   Discs: Degenerative disease with disc height loss at C4-5 and C5-6.   C2-3: No significant disc bulge. No neural foraminal stenosis. No central canal stenosis.   C3-4: Mild broad-based disc bulge. No foraminal or central canal stenosis.   C4-5: Mild broad-based disc bulge. No foraminal or central canal stenosis.   C5-6: Mild broad-based disc bulge. Bilateral uncovertebral degenerative changes. Moderate left and severe right foraminal stenosis. No spinal stenosis.   C6-7: Mild broad-based disc bulge. No foraminal or central canal stenosis.   C7-T1: No significant disc bulge. No neural foraminal stenosis. No central canal stenosis.   IMPRESSION: 1. At C5-6 there is a mild broad-based disc bulge. Bilateral uncovertebral degenerative changes. Moderate left and severe  right foraminal stenosis. 2. Otherwise, mild cervical spine spondylosis as described above.     Electronically Signed   By: Elige Ko M.D.   On: 12/16/2021 10:51  Imaging: No results found.   PMFS History: Patient Active Problem List   Diagnosis Date Noted   High risk medication use 01/16/2023   Medication monitoring encounter 12/05/2021   Physical deconditioning 11/11/2021   Elevated serum lactate dehydrogenase (LDH) 08/17/2021   Vitamin D deficiency 08/17/2021   Chronic fatigue 07/25/2021   Vitamin B12 deficiency 07/19/2021   Hardening of the aorta (main artery of the heart) (HCC) 06/21/2021   Solitary lung nodule 06/15/2021   Loss of weight 05/18/2021   Protein-calorie malnutrition, severe (HCC) 05/18/2021   Osteoporosis 06/15/2020   Iron deficiency anemia 04/09/2020   Primary osteoarthritis involving multiple joints 12/31/2018   Rheumatoid arthritis involving multiple sites with positive rheumatoid factor (HCC) 12/31/2018   Irritable bowel syndrome with both constipation and diarrhea 10/28/2018   Constipation 03/14/2016   Mitral valve prolapse 11/23/2015   High cholesterol 11/23/2015   Past Medical History:  Diagnosis Date   Anemia    Anxiety    Arthritis    RA   Cancer (HCC)    Skin  CHF (congestive heart failure) (HCC)    Collagen vascular disease (HCC)    Depression    Endometriosis    High cholesterol    IBS (irritable bowel syndrome)    Lymphadenopathy    Mitral valve prolapse    RA (rheumatoid arthritis) (HCC)    Rotator cuff tear    bilateral   Stroke (HCC) 06/2022   Uterus, adenomyosis     Family History  Problem Relation Age of Onset   Colon cancer Other    Bone cancer Mother    Brain cancer Father    Brain cancer Sister    Breast cancer Sister    Renal Disease Sister    Pancreatic cancer Neg Hx    Esophageal cancer Neg Hx    Stomach cancer Neg Hx    Rectal cancer Neg Hx     Past Surgical History:  Procedure Laterality Date    AXILLARY LYMPH NODE BIOPSY Right 10/12/2020   Procedure: RIGHT AXILLARY LYMPH NODE BIOPSY EXCISION;  Surgeon: Almond Lint, MD;  Location: [facility];  Service: General;  Laterality: Right;   BUNIONECTOMY Right    CARPAL TUNNEL RELEASE Right    COLONOSCOPY     COLONOSCOPY N/A 12/13/2015   Procedure: COLONOSCOPY;  Surgeon: Malissa Hippo, MD;  [facility];  Service: Endoscopy;  Laterality: N/A;  9:55   Fatty tumor     2011 (left arm)   FOOT SURGERY     hammertoe   TONSILLECTOMY     TOTAL ABDOMINAL HYSTERECTOMY     2003   Social History   Occupational History   Occupation: retired  Tobacco Use   Smoking status: Some Days    Current packs/day: 0.00    Average packs/day: 0.5 packs/day for 33.0 years (16.5 ttl pk-yrs)    Types: Cigarettes    Start date: 17    Last attempt to quit: 2013    Years since quitting: 11.6   Smokeless tobacco: Never  Vaping Use   Vaping status: Never Used  Substance and Sexual Activity   Alcohol use: No    Alcohol/week: 0.0 standard drinks of alcohol   Drug use: No   Sexual activity: Not Currently

## 2023-05-15 ENCOUNTER — Telehealth: Payer: Self-pay | Admitting: Sports Medicine

## 2023-05-15 ENCOUNTER — Other Ambulatory Visit: Payer: Self-pay | Admitting: Sports Medicine

## 2023-05-15 DIAGNOSIS — G8929 Other chronic pain: Secondary | ICD-10-CM

## 2023-05-15 NOTE — Telephone Encounter (Signed)
Order placed

## 2023-05-15 NOTE — Telephone Encounter (Signed)
Patient called that the PT services is called [facility] and its located on [address]. In [place]. WU#[phone]

## 2023-05-16 ENCOUNTER — Telehealth: Payer: Self-pay | Admitting: Surgical

## 2023-05-16 DIAGNOSIS — M79641 Pain in right hand: Secondary | ICD-10-CM

## 2023-05-16 NOTE — Telephone Encounter (Signed)
Patient called advised the injection worked well for the swelling in her left hand. Patient asked if she can be referred to Dr. Fara Boros for her Trigger Fingers. Patient said her 4th and 5th finger on both hands hurt and get stuck.  The number to contact patient is [phone]

## 2023-05-17 NOTE — Telephone Encounter (Signed)
Okay for this. Glad the injections helped her

## 2023-05-17 NOTE — Telephone Encounter (Signed)
Referral placed in chart  

## 2023-05-20 ENCOUNTER — Telehealth: Payer: Self-pay | Admitting: Rheumatology

## 2023-05-20 DIAGNOSIS — Z79899 Other long term (current) drug therapy: Secondary | ICD-10-CM

## 2023-05-20 NOTE — Telephone Encounter (Signed)
I received a text from the answering service at 7:47 PM that patient wanted a return call due to side effects of the medication she is taking for RA.  I returned patient's call at 8:45 PM.  Patient stated that since she has been taking hydroxychloroquine" she has been feeling bad".  She believes that it is hypoglycemia.  She stated that she is not diabetic.  She would like to have her blood glucose level checked tomorrow morning at [facility].  She wanted an order placed night so she can go to the lab in the morning.  I advised her that I I will place the order for CMP in the chart per her request. I also advised her that I will send a message to Dr. Dimple Casey further recommendations. Pollyann Savoy, MD

## 2023-05-21 ENCOUNTER — Other Ambulatory Visit (HOSPITAL_COMMUNITY)
Admission: RE | Admit: 2023-05-21 | Discharge: 2023-05-21 | Disposition: A | Discharge status: Home / Self Care | Payer: Medicare HMO | Source: Ambulatory Visit | Attending: Internal Medicine | Admitting: Internal Medicine

## 2023-05-21 DIAGNOSIS — Z79899 Other long term (current) drug therapy: Secondary | ICD-10-CM | POA: Diagnosis present

## 2023-05-21 DIAGNOSIS — Z5181 Encounter for therapeutic drug level monitoring: Secondary | ICD-10-CM | POA: Insufficient documentation

## 2023-05-21 LAB — COMPREHENSIVE METABOLIC PANEL
ALT: 10 U/L (ref 0–44)
AST: 18 U/L (ref 15–41)
Albumin: 3.8 g/dL (ref 3.5–5.0)
Alkaline Phosphatase: 110 U/L (ref 38–126)
Anion gap: 8 (ref 5–15)
BUN: 14 mg/dL (ref 8–23)
CO2: 26 mmol/L (ref 22–32)
Calcium: 8.8 mg/dL — ABNORMAL LOW (ref 8.9–10.3)
Chloride: 101 mmol/L (ref 98–111)
Creatinine, Ser: 0.62 mg/dL (ref 0.44–1.00)
GFR, Estimated: >60 mL/min (ref >60)
Glucose, Bld: 94 mg/dL (ref 70–99)
Potassium: 3.5 mmol/L (ref 3.5–5.1)
Sodium: 135 mmol/L (ref 135–145)
Total Bilirubin: 0.5 mg/dL (ref 0.3–1.2)
Total Protein: 7.3 g/dL (ref 6.5–8.1)

## 2023-05-21 NOTE — Progress Notes (Signed)
Calcium is low.  Please advise patient to take calcium supplement.  Total intake of calcium should be 1200 mg between diet and supplementation.

## 2023-05-23 ENCOUNTER — Other Ambulatory Visit: Payer: Self-pay | Admitting: Internal Medicine

## 2023-05-23 ENCOUNTER — Telehealth: Payer: Self-pay | Admitting: Surgical

## 2023-05-23 DIAGNOSIS — M546 Pain in thoracic spine: Secondary | ICD-10-CM

## 2023-05-23 NOTE — Telephone Encounter (Signed)
Last Fill: 10/03/2022  Next Visit: 05/28/2023  Last Visit: 03/08/2023  Dx: not mentioned  Current Dose per office note on 03/08/2023: not mentioned  Okay to refill Flexeril?

## 2023-05-23 NOTE — Telephone Encounter (Signed)
Patient called advised she has an appointment Friday with Caryn Section and asked if her X-Rays can be sent to [facility] on [address] in [place]? Patient said she starts (PT) Friday. Patient said her appointment is at 12:30 pm. 10/02/2022 through 05/02/2023.  The number to contact patient is [phone]  Mailed patient a medical records release form

## 2023-05-24 NOTE — Progress Notes (Deleted)
Office Visit Note  Patient: Vanessa Lara             Date of Birth: 04-25-1956           MRN: [id number]             PCP: [name], NP Referring: [name], NP Visit Date: 05/28/2023   Subjective:  No chief complaint on file.   History of Present Illness: Vanessa Lara is a 67 y.o. female here for follow up for seropositive RA.    Previous HPI 03/08/2023 Vanessa Lara is a 67 y.o. female here for follow up for seropositive RA.  Currently off any disease specific medication she cannot tolerate taking the sulfasalazine due to severe stomach irritation described as burning up her stomach.  Continues having joint pain and stiffness on a daily basis overall feels her function is very poor due to the severity.  Had labs last month still anemic at hemoglobin of 9.8 being treated with infusion for iron deficiency anemia.   Previous HPI 01/16/23 Vanessa Lara is a 67 y.o. female here for follow up for seropositive RA.  Currently health maintenance treatment after discontinuing 5 mg prednisone is on medication for joint pain for RA along with osteoarthritis and chronic pain syndrome.  Had recent knee steroid injection since her last visit but reports overall widespread symptoms are severe.  Particularly is been noticing some increased trouble with pain and some swelling involving the wrist worse on left side.   Previous HPI 10/03/22 Vanessa Lara is a 67 y.o. female here for follow up for seropositive RA currently off long term DMARD with prednisone 5 mg daily as needed.  She is overall not doing well feels like pain and stiffness is a big problem but more severe is her fatigue and feels a generalized getting weaker with difficulty moving and with her upper body strength.  She pretty consistently has to take the prednisone for otherwise does not move around much during the day.  Continues to get a benefit with the Flexeril at nighttime with slight decrease in frequency of  awakenings.  She is also concerned about getting an update to her bone density testing ongoing oral alendronate treatment.  She is also still having a lot of pain and difficulty with walking from her claw toe deformities on the foot and planning to follow-up with surgery clinic about management of this.   Previous HPI 06/28/22 Vanessa Lara is a 67 y.o. female here for follow up for seropositive RA now off of treatment after stopping the [place].  Since she stopped taking the medication her complaint of night sweats and fatigue have improved. She has seen a worsening of joint pain in several areas particularly around the MCP joints of both hands. For knee pain she saw orthopedic surgery clinic yesterday and had injections on both sides.  She also discussed getting set up to do right SI joint steroid injection.  Biggest complaint today is back pain on the right side around the middle of the back and around the scapula.  This bothers her especially with certain movements and trying to bend side to side.    Previous HPI 05/18/2022  Vanessa Lara is a 67 y.o. female here for follow up for seropositive rheumatoid arthritis on Kevzara 150 mg [place] q14days. She feels arthritis has been fairly well controlled but has pain worse in her left foot mostly. She had GI issues suspected UTI but with negative culture  since last visit. She feels a sensation "like her brain is numb." Also has ongoing upper neck pain without radiating symptoms.    Previous HPI 02/07/2022 Vanessa Lara is a 67 y.o. female here for follow up for rheumatoid arthritis with chronic joint pain in multiple areas and chronic fatigue labs showing elevated rheumatoid factor. Kevzara new start on 12/15/2021. She felt an improvement when taking the injections within first weeks. Accidentally took the shots twice in a week due to forgetfulness or mistake but did not notice any major problems. She called in on account of increased joint pain but felt no  improvement with the prescribed medrol dose pack. She did feel more lightheadedness or off balance feeling while taking this so stopped after about 3 days. Currently back pain and her left foot pain are worst issues.   Previous HPI 12/05/2021 Vanessa Lara is a 67 y.o. female here for evaluation of rheumatoid arthritis with chronic joint pain in multiple areas and chronic fatigue labs showing elevated rheumatoid factor. She has history of mitral valve prolapse, severe osteoporosis, osteoarthritis, and severe weight loss and deconditioning. She was diagnosed with seropositive RA in 2018 with Dr. Deanne Coffer and most recently saw Dr. Dierdre Forth in 11/2020. At that time recommended to start hydroxychloroquine for inflammatory arthritis symptoms. She previously tried methotrexate, simponi aria, and orencia with poor tolerance. Imaging consistent with degenerative arthritis in cervical spine and knee but also with some partial tendon tear in shoulder and some synovitis present in knee on MRI imaging. She has taken multiple medications for pain including NSAIDs, prednisone, gabapentin, tramadol, and hydrocodone or oxycodone. She has a lot of pain in multiple areas but is also severely fatigued. She has significant weight loss down to 70 lbs unintentionally reports chronic IBS issues but no recent changes during this period of weight loss. Joint pain also limits her activity quite a bit with chronic deformities and also ongoing swelling in bilateral hands. She has also developed left sided headache localized above the temporal area. She has a lot of pain at the base of the skull and occiput without clear radiation of symptoms. She is scheduled to see Dr. Delena Bali for headache evaluation and management 3/28.   DMARD Hx MTX Simponi Orencia Kevzara   05/2017 RF 15 CCP 32 HBV neg HCV neg   No Rheumatology ROS completed.   PMFS History:  Patient Active Problem List   Diagnosis Date Noted   High risk medication use  01/16/2023   Medication monitoring encounter 12/05/2021   Physical deconditioning 11/11/2021   Elevated serum lactate dehydrogenase (LDH) 08/17/2021   Vitamin D deficiency 08/17/2021   Chronic fatigue 07/25/2021   Vitamin B12 deficiency 07/19/2021   Hardening of the aorta (main artery of the heart) (HCC) 06/21/2021   Solitary lung nodule 06/15/2021   Loss of weight 05/18/2021   Protein-calorie malnutrition, severe (HCC) 05/18/2021   Osteoporosis 06/15/2020   Iron deficiency anemia 04/09/2020   Primary osteoarthritis involving multiple joints 12/31/2018   Rheumatoid arthritis involving multiple sites with positive rheumatoid factor (HCC) 12/31/2018   Irritable bowel syndrome with both constipation and diarrhea 10/28/2018   Constipation 03/14/2016   Mitral valve prolapse 11/23/2015   High cholesterol 11/23/2015    Past Medical History:  Diagnosis Date   Anemia    Anxiety    Arthritis    RA   Cancer (HCC)    Skin   CHF (congestive heart failure) (HCC)    Collagen vascular disease (HCC)    Depression  Endometriosis    High cholesterol    IBS (irritable bowel syndrome)    Lymphadenopathy    Mitral valve prolapse    RA (rheumatoid arthritis) (HCC)    Rotator cuff tear    bilateral   Stroke [facility]) 06/2022   Uterus, adenomyosis     Family History  Problem Relation Age of Onset   Colon cancer Other    Bone cancer Mother    Brain cancer Father    Brain cancer Sister    Breast cancer Sister    Renal Disease Sister    Pancreatic cancer Neg Hx    Esophageal cancer Neg Hx    Stomach cancer Neg Hx    Rectal cancer Neg Hx    Past Surgical History:  Procedure Laterality Date   AXILLARY LYMPH NODE BIOPSY Right 10/12/2020   Procedure: RIGHT AXILLARY LYMPH NODE BIOPSY EXCISION;  Surgeon: Almond Lint, MD;  Location: [facility];  Service: General;  Laterality: Right;   BUNIONECTOMY Right    CARPAL TUNNEL RELEASE Right    COLONOSCOPY     COLONOSCOPY N/A  12/13/2015   Procedure: COLONOSCOPY;  Surgeon: Malissa Hippo, MD;  [facility];  Service: Endoscopy;  Laterality: N/A;  9:55   Fatty tumor     2011 (left arm)   FOOT SURGERY     hammertoe   TONSILLECTOMY     TOTAL ABDOMINAL HYSTERECTOMY     2003   Social History   Social History Narrative   Not on file   Immunization History  Administered Date(s) Administered   Influenza, Quadrivalent, Recombinant, Inj, Pf 07/11/2017     Objective: Vital Signs: There were no vitals taken for this visit.   Physical Exam   Musculoskeletal Exam: ***  CDAI Exam: CDAI Score: -- Patient Global: --; Provider Global: -- Swollen: --; Tender: -- Joint Exam 05/28/2023   No joint exam has been documented for this visit   There is currently no information documented on the homunculus. Go to the Rheumatology activity and complete the homunculus joint exam.  Investigation: No additional findings.  Imaging: US Guided Needle Placement - No Linked Charges  Result Date: 05/12/2023 Ultrasound imaging demonstrates out of plane approach with needle placement into the fifth MCP joint with extravasation of fluid and no complication.  Next imaging demonstrates in-plane approach with needle placement into the dorsal recess of the fourth MCP joint with extravasation of fluid and no complication.  XR Ankle Complete Right  Result Date: 05/03/2023 AP, oblique, lateral views of right ankle reviewed.  No fracture or dislocation.  Mild degenerative changes of the right ankle joint.   Recent Labs: Lab Results  Component Value Date   WBC 8.5 04/10/2023   HGB 11.0 (L) 04/10/2023   PLT 320 04/10/2023   NA 135 05/21/2023   K 3.5 05/21/2023   CL 101 05/21/2023   CO2 26 05/21/2023   GLUCOSE 94 05/21/2023   BUN 14 05/21/2023   CREATININE 0.62 05/21/2023   BILITOT 0.5 05/21/2023   ALKPHOS 110 05/21/2023   AST 18 05/21/2023   ALT 10 05/21/2023   PROT 7.3 05/21/2023   ALBUMIN 3.8 05/21/2023    CALCIUM 8.8 (L) 05/21/2023   GFRAA 92 11/06/2019   QFTBGOLDPLUS NEGATIVE 12/05/2021    Speciality Comments: No specialty comments available.  Procedures:  No procedures performed Allergies: Abatacept, Codeine, Golimumab, Hydrocodone, Methylprednisolone, Methotrexate derivatives, and Prednisone   Assessment / Plan:     Visit Diagnoses: No diagnosis found.  ***  Orders: No orders of the defined types were placed in this encounter.  No orders of the defined types were placed in this encounter.    Follow-Up Instructions: No follow-ups on file.   Metta Clines, RT  Note - This record has been created using [employer].  Chart creation errors have been sought, but may not always  have been located. Such creation errors do not reflect on  the standard of medical care.

## 2023-05-24 NOTE — Telephone Encounter (Signed)
Spoke with patient, she has an appointment with PT and they need xrays. Please copy all xrays from 2022-present to CD and mail to [facility] [address] [place] [id number]. Thank you! Verbal Berkley Harvey accepted.

## 2023-05-24 NOTE — Telephone Encounter (Signed)
CD made and placed in box to be mailed

## 2023-05-28 ENCOUNTER — Ambulatory Visit: Payer: Medicare HMO | Admitting: Internal Medicine

## 2023-05-28 ENCOUNTER — Other Ambulatory Visit: Payer: Self-pay

## 2023-05-28 DIAGNOSIS — Z79899 Other long term (current) drug therapy: Secondary | ICD-10-CM

## 2023-05-28 DIAGNOSIS — M159 Polyosteoarthritis, unspecified: Secondary | ICD-10-CM

## 2023-05-28 DIAGNOSIS — M0579 Rheumatoid arthritis with rheumatoid factor of multiple sites without organ or systems involvement: Secondary | ICD-10-CM

## 2023-05-29 ENCOUNTER — Telehealth: Payer: Self-pay | Admitting: *Deleted

## 2023-05-29 NOTE — Telephone Encounter (Signed)
Patient called stating that her fatigue has gotten debilitating and she feels her iron may be low.  Denies other associated symptoms.  Will come tomorrow for labs and will consult with practitioner regarding results.

## 2023-05-30 ENCOUNTER — Inpatient Hospital Stay: Payer: Medicare HMO | Attending: Hematology

## 2023-05-30 DIAGNOSIS — D509 Iron deficiency anemia, unspecified: Secondary | ICD-10-CM | POA: Insufficient documentation

## 2023-05-30 DIAGNOSIS — D500 Iron deficiency anemia secondary to blood loss (chronic): Secondary | ICD-10-CM

## 2023-05-30 LAB — CBC WITH DIFFERENTIAL/PLATELET
Abs Immature Granulocytes: 0.03 10*3/uL (ref 0.00–0.07)
Basophils Absolute: 0 10*3/uL (ref 0.0–0.1)
Basophils Relative: 0 %
Eosinophils Absolute: 0.1 10*3/uL (ref 0.0–0.5)
Eosinophils Relative: 2 %
HCT: 32.9 % — ABNORMAL LOW (ref 36.0–46.0)
Hemoglobin: 10.4 g/dL — ABNORMAL LOW (ref 12.0–15.0)
Immature Granulocytes: 0 %
Lymphocytes Relative: 19 %
Lymphs Abs: 1.4 10*3/uL (ref 0.7–4.0)
MCH: 29.9 pg (ref 26.0–34.0)
MCHC: 31.6 g/dL (ref 30.0–36.0)
MCV: 94.5 fL (ref 80.0–100.0)
Monocytes Absolute: 0.3 10*3/uL (ref 0.1–1.0)
Monocytes Relative: 4 %
Neutro Abs: 5.9 10*3/uL (ref 1.7–7.7)
Neutrophils Relative %: 75 %
Platelets: 319 10*3/uL (ref 150–400)
RBC: 3.48 MIL/uL — ABNORMAL LOW (ref 3.87–5.11)
RDW: 16.3 % — ABNORMAL HIGH (ref 11.5–15.5)
WBC: 7.8 10*3/uL (ref 4.0–10.5)
nRBC: 0 % (ref 0.0–0.2)

## 2023-05-30 LAB — IRON AND TIBC
Iron: 38 ug/dL (ref 28–170)
Saturation Ratios: 18 % (ref 10.4–31.8)
TIBC: 216 ug/dL — ABNORMAL LOW (ref 250–450)
UIBC: 178 ug/dL

## 2023-05-30 LAB — FERRITIN: Ferritin: 133 ng/mL (ref 11–307)

## 2023-05-31 ENCOUNTER — Telehealth: Payer: Self-pay | Admitting: *Deleted

## 2023-05-31 NOTE — Telephone Encounter (Signed)
Lab results reviewed with Durenda Hurt, NP.  She has recommended 2 doses of IV Venofer 200 mg.  Appointments made and patient is aware.

## 2023-06-01 ENCOUNTER — Telehealth: Payer: Self-pay | Admitting: Sports Medicine

## 2023-06-01 NOTE — Telephone Encounter (Signed)
Okay sounds good. Just let her know to call us when she is done and ready to try PT

## 2023-06-01 NOTE — Telephone Encounter (Signed)
Pt called requesting to hold off on referral for physical therapy. She states her PCP stated her iron count is low and she need to hold off until she finish series of blood iron infusions. Pt phone number is [phone]

## 2023-06-05 ENCOUNTER — Telehealth: Payer: Self-pay | Admitting: Gastroenterology

## 2023-06-05 DIAGNOSIS — R195 Other fecal abnormalities: Secondary | ICD-10-CM

## 2023-06-05 DIAGNOSIS — D500 Iron deficiency anemia secondary to blood loss (chronic): Secondary | ICD-10-CM

## 2023-06-05 NOTE — Telephone Encounter (Signed)
Dr. Russella Dar as DOD PM of 06/05/23 - Please see note below and advise. Thanks  Dr. Myrtie Neither' patient with a history of IDA and heme positive stool. PCP reached out today, please see note below. Looks like repeat EGD and colonoscopy were recommended and then VCE if endoscopic procedures were negative. Please advise, thanks.

## 2023-06-05 NOTE — Telephone Encounter (Signed)
Lm on vm for [name], NP to return call to discuss recommendations.

## 2023-06-05 NOTE — Telephone Encounter (Signed)
Inbound call from patients PCP [name] stating that patient is still having blood in stool and was reading over Dr. Myrtie Neither last OV note and seen where he mentions a capsule endo. She stated that patient has had 14 iron infusions and her hemoglobin is now lower than what it was to start with. Toni Amend is requesting a call to discuss if Dr. Sharrell Ku still want to do the capsule. Please advise.

## 2023-06-05 NOTE — Telephone Encounter (Signed)
Per [facility] June 2024 office note repeat colonoscopy and EGD was offered. Please offer patient colonoscopy and EGD with HD. If they are negative HD will decide about VCE. If she declines colonoscopy and EGD HD should review and decide on plans.

## 2023-06-06 ENCOUNTER — Ambulatory Visit: Payer: Medicare HMO | Admitting: Internal Medicine

## 2023-06-06 NOTE — Telephone Encounter (Signed)
Called and spoke with patient regarding recommendations below. Pt states that she would like for me to ask Dr. Myrtie Neither if she can do another set of Hemoccult cards before proceeding with procedures. I informed pt that her PCP mentioned that she has continued blood in the stool, decreased Hgb despite IV iron and she already had a positive hemoccult study in June. I told pt that I would ask and get back to her. Pt did want to proceed with scheduling EGD and colonoscopy, appt is on 06/21/23 at 3 pm. Arrival time 2:00 pm with a care partner. Pt is aware that Dr. Myrtie Neither is out of the office until 9/9 and we will be in touch as soon as we can. Pt verbalized understanding and had no concerns at the end of the call.  Will complete procedure instructions once appt is confirmed.   Ambulatory referral to GI in epic.

## 2023-06-07 ENCOUNTER — Inpatient Hospital Stay: Payer: Medicare HMO | Attending: Hematology

## 2023-06-07 ENCOUNTER — Telehealth: Payer: Self-pay

## 2023-06-07 VITALS — BP 111/65 | HR 85 | Temp 97.5°F | Resp 18

## 2023-06-07 DIAGNOSIS — D509 Iron deficiency anemia, unspecified: Secondary | ICD-10-CM | POA: Insufficient documentation

## 2023-06-07 DIAGNOSIS — D500 Iron deficiency anemia secondary to blood loss (chronic): Secondary | ICD-10-CM

## 2023-06-07 MED ORDER — ACETAMINOPHEN 325 MG PO TABS
325.0000 mg | ORAL_TABLET | Freq: Once | ORAL | Status: AC
  Administered 2023-06-07: 325 mg via ORAL
  Filled 2023-06-07: qty 1

## 2023-06-07 MED ORDER — SODIUM CHLORIDE 0.9 % IV SOLN
200.0000 mg | Freq: Once | INTRAVENOUS | Status: AC
  Administered 2023-06-07: 200 mg via INTRAVENOUS
  Filled 2023-06-07: qty 200

## 2023-06-07 MED ORDER — SODIUM CHLORIDE 0.9 % IV SOLN: Freq: Once | INTRAVENOUS | Status: AC

## 2023-06-07 NOTE — Patient Instructions (Signed)
[facility]  Discharge Instructions: Thank you for choosing [facility] to provide your oncology and hematology care.  If you have a lab appointment with the Cancer Center - please note that after April 8th, 2024, all labs will be drawn in the cancer center.  You do not have to check in or register with the main entrance as you have in the past but will complete your check-in in the cancer center.  Wear comfortable clothing and clothing appropriate for easy access to any Portacath or PICC line.   We strive to give you quality time with your provider. You may need to reschedule your appointment if you arrive late (15 or more minutes).  Arriving late affects you and other patients whose appointments are after yours.  Also, if you miss three or more appointments without notifying the office, you may be dismissed from the clinic at the provider's discretion.      For prescription refill requests, have your pharmacy contact our office and allow 72 hours for refills to be completed.    Today you received the following chemotherapy and/or immunotherapy agents Venofer      To help prevent nausea and vomiting after your treatment, we encourage you to take your nausea medication as directed.  BELOW ARE SYMPTOMS THAT SHOULD BE REPORTED IMMEDIATELY: *FEVER GREATER THAN 100.4 F (38 C) OR HIGHER *CHILLS OR SWEATING *NAUSEA AND VOMITING THAT IS NOT CONTROLLED WITH YOUR NAUSEA MEDICATION *UNUSUAL SHORTNESS OF BREATH *UNUSUAL BRUISING OR BLEEDING *URINARY PROBLEMS (pain or burning when urinating, or frequent urination) *BOWEL PROBLEMS (unusual diarrhea, constipation, pain near the anus) TENDERNESS IN MOUTH AND THROAT WITH OR WITHOUT PRESENCE OF ULCERS (sore throat, sores in mouth, or a toothache) UNUSUAL RASH, SWELLING OR PAIN  UNUSUAL VAGINAL DISCHARGE OR ITCHING   Items with * indicate a potential emergency and should be followed up as soon as possible or go to the  Emergency Department if any problems should occur.  Please show the CHEMOTHERAPY ALERT CARD or IMMUNOTHERAPY ALERT CARD at check-in to the Emergency Department and triage nurse.  Should you have questions after your visit or need to cancel or reschedule your appointment, please contact [facility] [phone]  and follow the prompts.  Office hours are 8:00 a.m. to 4:30 p.m. Monday - Friday. Please note that voicemails left after 4:00 p.m. may not be returned until the following business day.  We are closed weekends and major holidays. You have access to a nurse at all times for urgent questions. Please call the main number to the clinic [phone] and follow the prompts.  For any non-urgent questions, you may also contact your provider using MyChart. We now offer e-Visits for anyone 18 and older to request care online for non-urgent symptoms. For details visit [email or web address].   Also download the MyChart app! Go to the app store, search "MyChart", open the app, select [facility], and log in with your MyChart username and password.   

## 2023-06-07 NOTE — Progress Notes (Signed)
Patient presents today for Venofer infusion per providers order.  Vital signs WNL.  Patient states that she took premedications at home.  Peripheral IV started and blood return noted pre and    Patient c/o chronic back pain and is asking for a tylenol in addition to the extra strength tylenol she took at home.  NP notified order received.    Stable during infusion without adverse affects.  Vital signs stable.  No complaints at this time.  Discharge from clinic ambulatory in stable condition.  Alert and oriented X 3.  Follow up with [facility] as scheduled.

## 2023-06-07 NOTE — Telephone Encounter (Signed)
Patient contacted the office to states she had to stop taking the Hydroxychloroquine because the medication cannot be taken with her Paxil and duloxetine. Patient states she is just taking the Paxil now and dropped the Hydroxychloroquine and Duloxetine.   Patient states Dr. Jordan Hawks, the patient's periodontist has sent [place] a surgical clearance. Advised the patient we have not received a fax and that she should call them to ensure they have the correct fax number. Patient states she will call them.

## 2023-06-08 ENCOUNTER — Encounter: Payer: Self-pay | Admitting: Gastroenterology

## 2023-06-11 MED ORDER — NA SULFATE-K SULFATE-MG SULF 17.5-3.13-1.6 GM/177ML PO SOLN: 1.0000 | ORAL | 0 refills | Status: DC

## 2023-06-11 NOTE — Telephone Encounter (Signed)
Returned call to patient. Pt states that Miralax "blows her up" and does not help her pass any stool. Pt wanted to know if there was an alternative that she could use instead? Pt also wanted a sooner appt for a double, appt has been moved to Monday, 06/18/23 at 2:30 pm. Pt to arrive by 1:30 pm with a care partner. Please advise on Miralax or alternative.

## 2023-06-11 NOTE — Telephone Encounter (Signed)
Inbound call from patient requesting a call back regarding previous notes. Please advise, thank you.

## 2023-06-11 NOTE — Telephone Encounter (Signed)
Thank you for the update.  Skip the MiraLAX, just consume extra water with the bowel preparation, especially the evening dose.  HD

## 2023-06-11 NOTE — Telephone Encounter (Signed)
Thank you for the note as well as DOD input.  I do not think there is utility in repeating stool cards in this scenario.  I agree she needs an EGD and colonoscopy as scheduled and recommended to my last office note.  Based on those results, I can decide whether or not a repeat small bowel video capsule study as needed. (Of note, and documented in my June office note, no source of GI blood loss on 2022 EGD/colonoscopy and VCE)  Please have her avoid dicyclomine 2 days prior to the procedure, and also take 1 packet of MiraLAX and at least 8 ounces of water in both the morning and evening the day prior to prep day.  I want to be sure she has an adequate bowel preparation for optimal colon visualization.  H Danis

## 2023-06-11 NOTE — Telephone Encounter (Signed)
Called and informed pt that Dr. Myrtie Neither does not feel that repeat stool cards are needed at this time and he agrees with scheduled EGD and colonoscopy. Pt confirmed appt on 06/21/23 at 3 pm, arriving at 2 pm with a care partner. Pt will come by the office to pick up written prep instructions, she is aware that RX for prep has been sent to [facility] in [place], [place]. Pt states that she does not think that she is taking Dicyclomine, but knows that I will add to her instructions either way in case. Pt verbalized understanding and had no concerns at the end of the call.  Prep instructions printed and placed at 2nd floor receptionist desk.  SUPREP sent to pharmacy on file.

## 2023-06-11 NOTE — Telephone Encounter (Signed)
Called and informed pt of updated recommendations. Pt will pick up prep instructions from 2nd floor receptionist desk. Pt verbalized understanding and had no concerns.

## 2023-06-12 NOTE — Progress Notes (Deleted)
No chief complaint on file.   HISTORY OF PRESENT ILLNESS:  06/12/23 ALL:  Vanessa Lara is a 67 y.o. female here today for follow up for headaches. Last seen by Dr Delena Bali 11/2022. She continues Lyrica 25mg  BID.   MRI showed remote infarcts. She continues atorvastatin and asa.   Botox?   HISTORY (copied from Dr Quentin Mulling previous note)  67 year old female with a history of Rheumatoid arthritis, B12 deficiency, HLD who follows in clinic for daily left-sided headaches. CTH was normal and MRI C-spine showed moderate left and severe right foraminal stenosis at C5-6.   At her last visit she was started on an indomethacin trial to rule out hemicrania continua.   Interval History: She did not have improvement with indomethacin so her gabapentin was increased and she underwent occipital nerve block on 03/02/22. This did not give her much improvement. Higher dose of gabapentin made her dizzy, so it was switched to Lyrica 25 mg BID. This has helped reduce the severity of her headaches. However she still has intermittent left-sided headaches with photophobia, nausea, and spots in her eyes. She is taking Flexeril as needed, which helps take the edge off.    She is continuing to have neck pain. She did have steroid injections in her neck in April 2023 but her pain continues. She's having pain in her lower back and hips as well. She was referred to physical therapy but she thinks she is in too much pain to be able to tolerate this.   Current Headache Regimen: Preventative: Lyrica 25 mg BID Abortive: none   Migraine days per month: 15 Headache free days per month: 15   Prior Therapies                                  Prevention: Indomethacin (up to 75 mg TID) - lack of efficacy Gabapentin - dizziness Lyrica 25 mg BID - helps, drowsiness at higher doses Paxil Metoprolol Cymbalta 20 mg daily   Rescue: Tramadol - drowsiness Tizanidine Baclofen Meloxicam   REVIEW OF SYSTEMS: Out of a  complete 14 system review of symptoms, the patient complains only of the following symptoms, and all other reviewed systems are negative.   ALLERGIES: Allergies  Allergen Reactions   Abatacept Other (See Comments)   Codeine Other (See Comments)    Stomach pains/constipation- due to IBS   Golimumab Other (See Comments)   Hydrocodone Other (See Comments)    She does not want to take this due to intolerance to codeine.   Methylprednisolone Diarrhea and Other (See Comments)    "Made my IBS flare up"  Diarrhea, nausea   Methotrexate Derivatives Other (See Comments)    Agitation; pt stated, "I get no sleep; one dose caused no sleep for 7 days and 7 nights - that was when I got a 0.5 injection"   Prednisone Rash    Cannot take prednisone by mouth ---- red rash all over     HOME MEDICATIONS: Outpatient Medications Prior to Visit  Medication Sig Dispense Refill   acetaminophen (TYLENOL) 500 MG tablet Take 500 mg by mouth every 6 (six) hours as needed.     acetaminophen-codeine (TYLENOL #3) 300-30 MG tablet Take 1 tablet by mouth daily as needed for moderate pain. 30 tablet 0   alendronate (FOSAMAX) 70 MG tablet      ASPIRIN 81 PO Take 1 tablet by mouth daily.  atorvastatin (LIPITOR) 20 MG tablet Take 1 tablet (20 mg total) by mouth daily. 30 tablet 6   BD SYRINGE SLIP TIP 25G X 5/8" 1 ML MISC USE AS DIRECTED FOR ONCE MONTHLY B12 INJECTIONS 10 each 0   Cholecalciferol (VITAMIN D3) 25 MCG (1000 UT) CAPS 1 capsule Orally Once a day for 30 day(s)     cyanocobalamin (,VITAMIN B-12,) 1000 MCG/ML injection Inject 1,000 mcg into the skin every 30 (thirty) days.     cyclobenzaprine (FLEXERIL) 5 MG tablet TAKE 1 TABLET(5 MG) BY MOUTH AT BEDTIME AS NEEDED FOR MUSCLE SPASMS 30 tablet 0   diclofenac Sodium (VOLTAREN) 1 % GEL SMARTSIG:Gram(s) Topical 4 Times Daily PRN     dicyclomine (BENTYL) 10 MG capsule Take 1 capsule (10 mg total) by mouth 4 (four) times daily as needed for spasms. 360 capsule 1    docusate sodium (COLACE) 100 MG capsule Take 1 capsule (100 mg total) by mouth 2 (two) times daily. 60 capsule 2   fentaNYL (DURAGESIC) 50 MCG/HR Place onto the skin every 3 (three) days.     FORTEO 600 MCG/2.4ML SOPN ADMINISTER 0.08 ML UNDER THE SKIN DAILY 2.4 mL 0   furosemide (LASIX) 20 MG tablet Take 20 mg by mouth daily as needed.     Heat Wraps (THERMACARE BACK/HIP) MISC 1 application by Does not apply route daily. 3 each 11   hydroxychloroquine (PLAQUENIL) 200 MG tablet Take 1/2 tablet by mouth daily. 15 tablet 2   hydrOXYzine (ATARAX) 25 MG tablet Take 25 mg by mouth 2 (two) times daily as needed for itching.     hydrOXYzine (ATARAX) 50 MG tablet Take by mouth.     Insulin Pen Needle (SURE COMFORT PEN NEEDLES) 30G X 8 MM MISC Use as directed 100 each 1   lidocaine (LIDODERM) 5 % one patch daily.     lipase/protease/amylase (CREON) 12000-38000 units CPEP capsule Use 1 capsule with meals and snacks 270 capsule 1   LORazepam (ATIVAN) 0.5 MG tablet TAKE 1/2 TO 1 TABLET BY MOUTH AT BEDTIME AS NEEDED 60 tablet 0   megestrol (MEGACE) 40 MG tablet Take 1 tablet (40 mg total) by mouth daily. 30 tablet 6   metoprolol tartrate (LOPRESSOR) 25 MG tablet      MYRBETRIQ 25 MG TB24 tablet Take 25 mg by mouth daily.     Na Sulfate-K Sulfate-Mg Sulf 17.5-3.13-1.6 GM/177ML SOLN Take 1 kit by mouth as directed. 354 mL 0   naloxone (NARCAN) nasal spray 4 mg/0.1 mL SMARTSIG:Both Nares     naratriptan (AMERGE) 2.5 MG tablet Take 1 tablet (2.5 mg total) by mouth as needed for migraine. Take one (1) tablet at onset of headache; if returns or does not resolve, may repeat after 4 hours; do not exceed five (5) mg in 24 hours. 10 tablet 6   ondansetron (ZOFRAN-ODT) 4 MG disintegrating tablet Take 4 mg by mouth every 8 (eight) hours as needed.     pantoprazole (PROTONIX) 40 MG tablet TAKE 1 TABLET(40 MG) BY MOUTH TWICE DAILY 180 tablet 3   PARoxetine (PAXIL) 20 MG tablet Take 20 mg by mouth daily.     pregabalin  (LYRICA) 25 MG capsule Take 1 capsule (25 mg total) by mouth 2 (two) times daily. 60 capsule 5   promethazine (PHENERGAN) 25 MG tablet Take 0.5-1 tablets (12.5-25 mg total) by mouth every 6 (six) hours as needed for nausea or vomiting. 20 tablet 0   Syringe/Needle, Disp, (SYRINGE 3CC/23GX1") 23G X 1" 3 ML  MISC by Does not apply route.     Teriparatide, Recombinant, (FORTEO) 600 MCG/2.4ML SOPN INJECT 20 MCG UNDER THE SKIN (SUBCUTANEOUS INJECTION) DAILY 2.4 mL 5   traZODone (DESYREL) 100 MG tablet Take 1 tablet (100 mg total) by mouth at bedtime as needed for sleep. 30 tablet 6   No facility-administered medications prior to visit.     PAST MEDICAL HISTORY: Past Medical History:  Diagnosis Date   Anemia    Anxiety    Arthritis    RA   Cancer (HCC)    Skin   CHF (congestive heart failure) (HCC)    Collagen vascular disease (HCC)    Depression    Endometriosis    High cholesterol    IBS (irritable bowel syndrome)    Lymphadenopathy    Mitral valve prolapse    RA (rheumatoid arthritis) (HCC)    Rotator cuff tear    bilateral   Stroke (HCC) 06/2022   Uterus, adenomyosis      PAST SURGICAL HISTORY: Past Surgical History:  Procedure Laterality Date   AXILLARY LYMPH NODE BIOPSY Right 10/12/2020   Procedure: RIGHT AXILLARY LYMPH NODE BIOPSY EXCISION;  Surgeon: Almond Lint, MD;  Location: [facility];  Service: General;  Laterality: Right;   BUNIONECTOMY Right    CARPAL TUNNEL RELEASE Right    COLONOSCOPY     COLONOSCOPY N/A 12/13/2015   Procedure: COLONOSCOPY;  Surgeon: Malissa Hippo, MD;  Location[facility];  Service: Endoscopy;  Laterality: N/A;  9:55   Fatty tumor     2011 (left arm)   FOOT SURGERY     hammertoe   TONSILLECTOMY     TOTAL ABDOMINAL HYSTERECTOMY     2003     FAMILY HISTORY: Family History  Problem Relation Age of Onset   Colon cancer Other    Bone cancer Mother    Brain cancer Father    Brain cancer Sister    Breast cancer  Sister    Renal Disease Sister    Pancreatic cancer Neg Hx    Esophageal cancer Neg Hx    Stomach cancer Neg Hx    Rectal cancer Neg Hx      SOCIAL HISTORY: Social History   Socioeconomic History   Marital status: Single    Spouse name: Not on file   Number of children: Not on file   Years of education: Not on file   Highest education level: Not on file  Occupational History   Occupation: retired  Tobacco Use   Smoking status: Some Days    Current packs/day: 0.00    Average packs/day: 0.5 packs/day for 33.0 years (16.5 ttl pk-yrs)    Types: Cigarettes    Start date: 50    Last attempt to quit: 2013    Years since quitting: 11.6   Smokeless tobacco: Never  Vaping Use   Vaping status: Never Used  Substance and Sexual Activity   Alcohol use: No    Alcohol/week: 0.0 standard drinks of alcohol   Drug use: No   Sexual activity: Not Currently  Other Topics Concern   Not on file  Social History Narrative   Not on file   Social Determinants of Health   Financial Resource Strain: Low Risk  (11/14/2022)   Received from [facility], [facility]   Overall Financial Resource Strain (CARDIA)    Difficulty of Paying Living Expenses: Not hard at all  Food Insecurity: No Food Insecurity (11/14/2022)   Received from [facility], [facility]   Hunger Vital Sign    Worried About Running Out of Food in the Last Year: Never true    Ran Out of Food in the Last Year: Never true  Transportation Needs: No Transportation Needs (11/14/2022)   Received from [facility], [facility]   PRAPARE - Transportation    Lack of Transportation (Medical): No    Lack of Transportation (Non-Medical): No  Physical Activity: Unknown (11/14/2022)   Received from [facility], [facility]   Exercise Vital Sign    Days of Exercise per Week: 0 days    Minutes of Exercise per Session: Not on file  Stress: Stress Concern Present (11/14/2022)   Received from [facility], [facility] - Occupational Stress Questionnaire    Feeling of Stress : Very much  Social Connections: Somewhat Isolated (11/14/2022)   Received from [facility], [facility]   Social Network    How would you rate your social network (family, work, friends)?: Restricted participation with some degree of social isolation  Intimate Partner Violence: Not At Risk (11/14/2022)   Received from [facility], [facility]   HITS    Over the last 12 months how often did your partner physically hurt you?: 1    Over the last 12 months how often did your partner insult you or talk down to you?: 1    Over the last 12 months how often did your partner threaten you with physical harm?: 1    Over the last 12 months how often did your partner scream or curse at you?: 1     PHYSICAL EXAM  There were no vitals filed for this visit. There is no height or weight on file to calculate BMI.  Generalized: Well developed, in no acute distress  Cardiology: normal rate and rhythm, no murmur auscultated  Respiratory: clear to auscultation bilaterally    Neurological examination  Mentation: Alert oriented to time, place, history taking. Follows all commands speech and language fluent Cranial nerve II-XII: Pupils were equal round reactive to light. Extraocular movements were full, visual field were full on confrontational test. Facial sensation and strength were normal. Uvula tongue midline. Head turning and shoulder shrug  were normal and symmetric. Motor: The motor testing reveals 5 over 5 strength of all 4 extremities. Good symmetric motor tone is noted throughout.  Sensory: Sensory testing is intact to soft touch on all 4 extremities. No evidence of extinction is noted.  Coordination: Cerebellar testing reveals good finger-nose-finger and heel-to-shin bilaterally.  Gait and station: Gait is normal. Tandem gait is normal. Romberg is negative. No drift is seen.  Reflexes: Deep  tendon reflexes are symmetric and normal bilaterally.    DIAGNOSTIC DATA (LABS, IMAGING, TESTING) - I reviewed patient records, labs, notes, testing and imaging myself where available.  Lab Results  Component Value Date   WBC 7.8 05/30/2023   HGB 10.4 (L) 05/30/2023   HCT 32.9 (L) 05/30/2023   MCV 94.5 05/30/2023   PLT 319 05/30/2023      Component Value Date/Time   NA 135 05/21/2023 0930   NA 141 11/06/2019 1417   K 3.5 05/21/2023 0930   CL 101 05/21/2023 0930   CO2 26 05/21/2023 0930   GLUCOSE 94 05/21/2023 0930   BUN 14 05/21/2023 0930   BUN 10 11/06/2019 1417   CREATININE 0.62 05/21/2023 0930   CREATININE 0.67 01/16/2023 1655   CALCIUM 8.8 (L) 05/21/2023 0930  PROT 7.3 05/21/2023 0930   ALBUMIN 3.8 05/21/2023 0930   AST 18 05/21/2023 0930   AST 13 (L) 08/23/2020 1609   ALT 10 05/21/2023 0930   ALT 7 08/23/2020 1609   ALKPHOS 110 05/21/2023 0930   BILITOT 0.5 05/21/2023 0930   BILITOT 0.2 (L) 08/23/2020 1609   GFRNONAA >60 05/21/2023 0930   GFRNONAA >60 08/23/2020 1609   GFRAA 92 11/06/2019 1417   Lab Results  Component Value Date   CHOL 188 10/03/2022   HDL 58 10/03/2022   LDLCALC 105 (H) 10/03/2022   TRIG 137 10/03/2022   CHOLHDL 3.2 10/03/2022   Lab Results  Component Value Date   HGBA1C 5.1 11/02/2022   Lab Results  Component Value Date   VITAMINB12 628 02/02/2023   Lab Results  Component Value Date   TSH 3.340 05/27/2021        No data to display               No data to display           ASSESSMENT AND PLAN  67 y.o. year old female  has a past medical history of Anemia, Anxiety, Arthritis, Cancer (HCC), CHF (congestive heart failure) (HCC), Collagen vascular disease (HCC), Depression, Endometriosis, High cholesterol, IBS (irritable bowel syndrome), Lymphadenopathy, Mitral valve prolapse, RA (rheumatoid arthritis) (HCC), Rotator cuff tear, Stroke (HCC) (06/2022), and Uterus, adenomyosis. here with    Cerebrovascular accident  (CVA), unspecified mechanism (HCC)  Bilateral occipital neuralgia  Antonieta Loveless ***.  Healthy lifestyle habits encouraged. *** will follow up with PCP as directed. *** will return to see me in ***, sooner if needed. *** verbalizes understanding and agreement with this plan.   No orders of the defined types were placed in this encounter.    No orders of the defined types were placed in this encounter.    Shawnie Dapper, MSN, FNP-C 06/12/2023, 4:45 PM  [facility] [address], [address] [place], [place] [address] [phone]

## 2023-06-13 ENCOUNTER — Ambulatory Visit: Payer: Medicare HMO | Admitting: Internal Medicine

## 2023-06-13 ENCOUNTER — Ambulatory Visit: Payer: Medicare HMO | Admitting: Family Medicine

## 2023-06-13 ENCOUNTER — Telehealth: Payer: Self-pay | Admitting: Gastroenterology

## 2023-06-13 DIAGNOSIS — I639 Cerebral infarction, unspecified: Secondary | ICD-10-CM

## 2023-06-13 DIAGNOSIS — M5481 Occipital neuralgia: Secondary | ICD-10-CM

## 2023-06-13 NOTE — Telephone Encounter (Signed)
Patient called stating that [facility] on [address] has not received instructions. Patient is asking for instructions to be faxed again. Please advise.

## 2023-06-13 NOTE — Telephone Encounter (Signed)
Returned call to patient. I informed her that she will not receive her instructions in time if we place in the regular mail, pt wanted me to call the hospital mail room to see if they could deliver her instructions overnight. I told pt that is not something that we do and I wouldn't know where to start as I am a triage RN. Pt talked over me and told me that she was trying to tell me what I needed to do. I again told her that I do not think her request is something that is reasonable. Pt states that she is not able to come to [facility] to pick up instructions, she has no email or family that can pick them up either. I informed pt that she told me that she would be able to pick up instructions, I asked pt if she is not able to come pick up instructions how is she getting to her appt on Monday. Pt stated that she "has that taken care of". Pt talked over me for majority of the conversation. Pt has not picked up her prep from the pharmacy yet, I told her that we can fax instructions there for her to pick up. Pt is ok with this plan. Pt verbalized understanding.   Instructions printed and faxed to [facility] at [phone].

## 2023-06-13 NOTE — Telephone Encounter (Signed)
Received confirmation sheet that original fax went through at 1:40 pm. Faxed instructions again to the pharmacy as requested.

## 2023-06-13 NOTE — Telephone Encounter (Signed)
Received 2nd confirmation that fax was successfully sent to [facility] in [place] at 4:02 pm.  [facility] pharmacy  and spoke with Sharon. Selena confirmed that they received instructions and have placed in the bag with patient's prescription for her prep. They have notified pt.

## 2023-06-13 NOTE — Progress Notes (Deleted)
Office Visit Note  Patient: Vanessa Lara             Date of Birth: 08/01/1956           MRN: [id number]             PCP: [name], NP Referring: [name], NP Visit Date: 06/13/2023   Subjective:  No chief complaint on file.   History of Present Illness: Vanessa Lara is a 67 y.o. female here for follow up ***   Previous HPI 03/08/23 Vanessa Lara is a 67 y.o. female here for follow up for seropositive RA.  Currently off any disease specific medication she cannot tolerate taking the sulfasalazine due to severe stomach irritation described as burning up her stomach.  Continues having joint pain and stiffness on a daily basis overall feels her function is very poor due to the severity.  Had labs last month still anemic at hemoglobin of 9.8 being treated with infusion for iron deficiency anemia.   Previous HPI 01/16/23 Vanessa Lara is a 67 y.o. female here for follow up for seropositive RA.  Currently health maintenance treatment after discontinuing 5 mg prednisone is on medication for joint pain for RA along with osteoarthritis and chronic pain syndrome.  Had recent knee steroid injection since her last visit but reports overall widespread symptoms are severe.  Particularly is been noticing some increased trouble with pain and some swelling involving the wrist worse on left side.   Previous HPI 10/03/22 Vanessa Lara is a 67 y.o. female here for follow up for seropositive RA currently off long term DMARD with prednisone 5 mg daily as needed.  She is overall not doing well feels like pain and stiffness is a big problem but more severe is her fatigue and feels a generalized getting weaker with difficulty moving and with her upper body strength.  She pretty consistently has to take the prednisone for otherwise does not move around much during the day.  Continues to get a benefit with the Flexeril at nighttime with slight decrease in frequency of awakenings.  She is also  concerned about getting an update to her bone density testing ongoing oral alendronate treatment.  She is also still having a lot of pain and difficulty with walking from her claw toe deformities on the foot and planning to follow-up with surgery clinic about management of this.   Previous HPI 06/28/22 Vanessa Lara is a 67 y.o. female here for follow up for seropositive RA now off of treatment after stopping the [place].  Since she stopped taking the medication her complaint of night sweats and fatigue have improved. She has seen a worsening of joint pain in several areas particularly around the MCP joints of both hands. For knee pain she saw orthopedic surgery clinic yesterday and had injections on both sides.  She also discussed getting set up to do right SI joint steroid injection.  Biggest complaint today is back pain on the right side around the middle of the back and around the scapula.  This bothers her especially with certain movements and trying to bend side to side.    Previous HPI 05/18/2022  Vanessa Lara is a 67 y.o. female here for follow up for seropositive rheumatoid arthritis on Kevzara 150 mg [place] q14days. She feels arthritis has been fairly well controlled but has pain worse in her left foot mostly. She had GI issues suspected UTI but with negative culture since last visit.  She feels a sensation "like her brain is numb." Also has ongoing upper neck pain without radiating symptoms.    Previous HPI 02/07/2022 Vanessa Lara is a 67 y.o. female here for follow up for rheumatoid arthritis with chronic joint pain in multiple areas and chronic fatigue labs showing elevated rheumatoid factor. Kevzara new start on 12/15/2021. She felt an improvement when taking the injections within first weeks. Accidentally took the shots twice in a week due to forgetfulness or mistake but did not notice any major problems. She called in on account of increased joint pain but felt no improvement with the  prescribed medrol dose pack. She did feel more lightheadedness or off balance feeling while taking this so stopped after about 3 days. Currently back pain and her left foot pain are worst issues.   Previous HPI 12/05/2021 Vanessa Lara is a 67 y.o. female here for evaluation of rheumatoid arthritis with chronic joint pain in multiple areas and chronic fatigue labs showing elevated rheumatoid factor. She has history of mitral valve prolapse, severe osteoporosis, osteoarthritis, and severe weight loss and deconditioning. She was diagnosed with seropositive RA in 2018 with Dr. Deanne Coffer and most recently saw Dr. Dierdre Forth in 11/2020. At that time recommended to start hydroxychloroquine for inflammatory arthritis symptoms. She previously tried methotrexate, simponi aria, and orencia with poor tolerance. Imaging consistent with degenerative arthritis in cervical spine and knee but also with some partial tendon tear in shoulder and some synovitis present in knee on MRI imaging. She has taken multiple medications for pain including NSAIDs, prednisone, gabapentin, tramadol, and hydrocodone or oxycodone. She has a lot of pain in multiple areas but is also severely fatigued. She has significant weight loss down to 70 lbs unintentionally reports chronic IBS issues but no recent changes during this period of weight loss. Joint pain also limits her activity quite a bit with chronic deformities and also ongoing swelling in bilateral hands. She has also developed left sided headache localized above the temporal area. She has a lot of pain at the base of the skull and occiput without clear radiation of symptoms. She is scheduled to see Dr. Delena Bali for headache evaluation and management 3/28.   DMARD Hx MTX Simponi Orencia Kevzara   05/2017 RF 15 CCP 32 HBV neg HCV neg   No Rheumatology ROS completed.   PMFS History:  Patient Active Problem List   Diagnosis Date Noted   High risk medication use 01/16/2023   Medication  monitoring encounter 12/05/2021   Physical deconditioning 11/11/2021   Elevated serum lactate dehydrogenase (LDH) 08/17/2021   Vitamin D deficiency 08/17/2021   Chronic fatigue 07/25/2021   Vitamin B12 deficiency 07/19/2021   Hardening of the aorta (main artery of the heart) (HCC) 06/21/2021   Solitary lung nodule 06/15/2021   Loss of weight 05/18/2021   Protein-calorie malnutrition, severe (HCC) 05/18/2021   Osteoporosis 06/15/2020   Iron deficiency anemia 04/09/2020   Primary osteoarthritis involving multiple joints 12/31/2018   Rheumatoid arthritis involving multiple sites with positive rheumatoid factor (HCC) 12/31/2018   Irritable bowel syndrome with both constipation and diarrhea 10/28/2018   Constipation 03/14/2016   Mitral valve prolapse 11/23/2015   High cholesterol 11/23/2015    Past Medical History:  Diagnosis Date   Anemia    Anxiety    Arthritis    RA   Cancer (HCC)    Skin   CHF (congestive heart failure) (HCC)    Collagen vascular disease (HCC)    Depression  Endometriosis    High cholesterol    IBS (irritable bowel syndrome)    Lymphadenopathy    Mitral valve prolapse    RA (rheumatoid arthritis) (HCC)    Rotator cuff tear    bilateral   Stroke [facility]) 06/2022   Uterus, adenomyosis     Family History  Problem Relation Age of Onset   Colon cancer Other    Bone cancer Mother    Brain cancer Father    Brain cancer Sister    Breast cancer Sister    Renal Disease Sister    Pancreatic cancer Neg Hx    Esophageal cancer Neg Hx    Stomach cancer Neg Hx    Rectal cancer Neg Hx    Past Surgical History:  Procedure Laterality Date   AXILLARY LYMPH NODE BIOPSY Right 10/12/2020   Procedure: RIGHT AXILLARY LYMPH NODE BIOPSY EXCISION;  Surgeon: Almond Lint, MD;  Location: [facility];  Service: General;  Laterality: Right;   BUNIONECTOMY Right    CARPAL TUNNEL RELEASE Right    COLONOSCOPY     COLONOSCOPY N/A 12/13/2015   Procedure:  COLONOSCOPY;  Surgeon: Malissa Hippo, MD;  Location[facility];  Service: Endoscopy;  Laterality: N/A;  9:55   Fatty tumor     2011 (left arm)   FOOT SURGERY     hammertoe   TONSILLECTOMY     TOTAL ABDOMINAL HYSTERECTOMY     2003   Social History   Social History Narrative   Not on file   Immunization History  Administered Date(s) Administered   Influenza, Quadrivalent, Recombinant, Inj, Pf 07/11/2017     Objective: Vital Signs: There were no vitals taken for this visit.   Physical Exam   Musculoskeletal Exam: ***  CDAI Exam: CDAI Score: -- Patient Global: --; Provider Global: -- Swollen: --; Tender: -- Joint Exam 06/13/2023   No joint exam has been documented for this visit   There is currently no information documented on the homunculus. Go to the Rheumatology activity and complete the homunculus joint exam.  Investigation: No additional findings.  Imaging: No results found.  Recent Labs: Lab Results  Component Value Date   WBC 7.8 05/30/2023   HGB 10.4 (L) 05/30/2023   PLT 319 05/30/2023   NA 135 05/21/2023   K 3.5 05/21/2023   CL 101 05/21/2023   CO2 26 05/21/2023   GLUCOSE 94 05/21/2023   BUN 14 05/21/2023   CREATININE 0.62 05/21/2023   BILITOT 0.5 05/21/2023   ALKPHOS 110 05/21/2023   AST 18 05/21/2023   ALT 10 05/21/2023   PROT 7.3 05/21/2023   ALBUMIN 3.8 05/21/2023   CALCIUM 8.8 (L) 05/21/2023   GFRAA 92 11/06/2019   QFTBGOLDPLUS NEGATIVE 12/05/2021    Speciality Comments: No specialty comments available.  Procedures:  No procedures performed Allergies: Abatacept, Codeine, Golimumab, Hydrocodone, Methylprednisolone, Methotrexate derivatives, and Prednisone   Assessment / Plan:     Visit Diagnoses: No diagnosis found.  ***  Orders: No orders of the defined types were placed in this encounter.  No orders of the defined types were placed in this encounter.    Follow-Up Instructions: No follow-ups on file.   Fuller Plan, MD  Note - This record has been created using [employer].  Chart creation errors have been sought, but may not always  have been located. Such creation errors do not reflect on  the standard of medical care.

## 2023-06-13 NOTE — Telephone Encounter (Signed)
PT needs to have instructions sent to her. She wants them sent overnight but I advised we would have to send regularly. I tried to walk her through the mychart process but she could not get access.She unfortunately does not have the transportation to pick them up from [place]. Please advise.

## 2023-06-14 ENCOUNTER — Inpatient Hospital Stay: Payer: Medicare HMO

## 2023-06-14 ENCOUNTER — Other Ambulatory Visit: Payer: Self-pay

## 2023-06-14 VITALS — BP 112/62 | HR 81 | Temp 98.1°F | Resp 18

## 2023-06-14 DIAGNOSIS — D509 Iron deficiency anemia, unspecified: Secondary | ICD-10-CM | POA: Diagnosis not present

## 2023-06-14 DIAGNOSIS — D500 Iron deficiency anemia secondary to blood loss (chronic): Secondary | ICD-10-CM

## 2023-06-14 DIAGNOSIS — R110 Nausea: Secondary | ICD-10-CM

## 2023-06-14 DIAGNOSIS — E860 Dehydration: Secondary | ICD-10-CM

## 2023-06-14 LAB — CBC WITH DIFFERENTIAL/PLATELET
Abs Immature Granulocytes: 0.02 10*3/uL (ref 0.00–0.07)
Basophils Absolute: 0.1 10*3/uL (ref 0.0–0.1)
Basophils Relative: 1 %
Eosinophils Absolute: 0.1 10*3/uL (ref 0.0–0.5)
Eosinophils Relative: 1 %
HCT: 33 % — ABNORMAL LOW (ref 36.0–46.0)
Hemoglobin: 10.6 g/dL — ABNORMAL LOW (ref 12.0–15.0)
Immature Granulocytes: 0 %
Lymphocytes Relative: 18 %
Lymphs Abs: 1.8 10*3/uL (ref 0.7–4.0)
MCH: 30.3 pg (ref 26.0–34.0)
MCHC: 32.1 g/dL (ref 30.0–36.0)
MCV: 94.3 fL (ref 80.0–100.0)
Monocytes Absolute: 0.6 10*3/uL (ref 0.1–1.0)
Monocytes Relative: 6 %
Neutro Abs: 7.3 10*3/uL (ref 1.7–7.7)
Neutrophils Relative %: 74 %
Platelets: 456 10*3/uL — ABNORMAL HIGH (ref 150–400)
RBC: 3.5 MIL/uL — ABNORMAL LOW (ref 3.87–5.11)
RDW: 15.5 % (ref 11.5–15.5)
WBC: 9.8 10*3/uL (ref 4.0–10.5)
nRBC: 0 % (ref 0.0–0.2)

## 2023-06-14 LAB — COMPREHENSIVE METABOLIC PANEL
ALT: 13 U/L (ref 0–44)
AST: 28 U/L (ref 15–41)
Albumin: 3.6 g/dL (ref 3.5–5.0)
Alkaline Phosphatase: 142 U/L — ABNORMAL HIGH (ref 38–126)
Anion gap: 11 (ref 5–15)
BUN: 10 mg/dL (ref 8–23)
CO2: 26 mmol/L (ref 22–32)
Calcium: 9.1 mg/dL (ref 8.9–10.3)
Chloride: 102 mmol/L (ref 98–111)
Creatinine, Ser: 0.7 mg/dL (ref 0.44–1.00)
GFR, Estimated: >60 mL/min (ref >60)
Glucose, Bld: 121 mg/dL — ABNORMAL HIGH (ref 70–99)
Potassium: 3.5 mmol/L (ref 3.5–5.1)
Sodium: 139 mmol/L (ref 135–145)
Total Bilirubin: 0.3 mg/dL (ref 0.3–1.2)
Total Protein: 7.5 g/dL (ref 6.5–8.1)

## 2023-06-14 LAB — MAGNESIUM: Magnesium: 2 mg/dL (ref 1.7–2.4)

## 2023-06-14 MED ORDER — SODIUM CHLORIDE 0.9 % IV SOLN
200.0000 mg | Freq: Once | INTRAVENOUS | Status: AC
  Administered 2023-06-14: 200 mg via INTRAVENOUS
  Filled 2023-06-14: qty 200

## 2023-06-14 MED ORDER — SODIUM CHLORIDE 0.9 % IV SOLN: Freq: Once | INTRAVENOUS | Status: AC

## 2023-06-14 MED ORDER — ONDANSETRON HCL 4 MG/2ML IJ SOLN
4.0000 mg | Freq: Once | INTRAMUSCULAR | Status: AC
  Administered 2023-06-14: 4 mg via INTRAVENOUS
  Filled 2023-06-14: qty 2

## 2023-06-14 MED ORDER — SODIUM CHLORIDE 0.9 % IV SOLN: INTRAVENOUS | Status: DC

## 2023-06-14 NOTE — Patient Instructions (Signed)
[facility]  Discharge Instructions: Thank you for choosing [facility] to provide your oncology and hematology care.  If you have a lab appointment with the Cancer Center - please note that after April 8th, 2024, all labs will be drawn in the cancer center.  You do not have to check in or register with the main entrance as you have in the past but will complete your check-in in the cancer center.  Wear comfortable clothing and clothing appropriate for easy access to any Portacath or PICC line.   We strive to give you quality time with your provider. You may need to reschedule your appointment if you arrive late (15 or more minutes).  Arriving late affects you and other patients whose appointments are after yours.  Also, if you miss three or more appointments without notifying the office, you may be dismissed from the clinic at the provider's discretion.      For prescription refill requests, have your pharmacy contact our office and allow 72 hours for refills to be completed.    Today you received the following:  Venofer.  Iron Sucrose Injection What is this medication? IRON SUCROSE (EYE ern SOO krose) treats low levels of iron (iron deficiency anemia) in people with kidney disease. Iron is a mineral that plays an important role in making red blood cells, which carry oxygen from your lungs to the rest of your body. This medicine may be used for other purposes; ask your health care provider or pharmacist if you have questions. COMMON BRAND NAME(S): Venofer What should I tell my care team before I take this medication? They need to know if you have any of these conditions: Anemia not caused by low iron levels Heart disease High levels of iron in the blood Kidney disease Liver disease An unusual or allergic reaction to iron, other medications, foods, dyes, or preservatives Pregnant or trying to get pregnant Breastfeeding How should I use this  medication? This medication is for infusion into a vein. It is given in a hospital or clinic setting. Talk to your care team about the use of this medication in children. While this medication may be prescribed for children as young as 2 years for selected conditions, precautions do apply. Overdosage: If you think you have taken too much of this medicine contact a poison control center or emergency room at once. NOTE: This medicine is only for you. Do not share this medicine with others. What if I miss a dose? Keep appointments for follow-up doses. It is important not to miss your dose. Call your care team if you are unable to keep an appointment. What may interact with this medication? Do not take this medication with any of the following: Deferoxamine Dimercaprol Other iron products This medication may also interact with the following: Chloramphenicol Deferasirox This list may not describe all possible interactions. Give your health care provider a list of all the medicines, herbs, non-prescription drugs, or dietary supplements you use. Also tell them if you smoke, drink alcohol, or use illegal drugs. Some items may interact with your medicine. What should I watch for while using this medication? Visit your care team regularly. Tell your care team if your symptoms do not start to get better or if they get worse. You may need blood work done while you are taking this medication. You may need to follow a special diet. Talk to your care team. Foods that contain iron include: whole grains/cereals, dried fruits, beans,  or peas, leafy green vegetables, and organ meats (liver, kidney). What side effects may I notice from receiving this medication? Side effects that you should report to your care team as soon as possible: Allergic reactions--skin rash, itching, hives, swelling of the face, lips, tongue, or throat Low blood pressure--dizziness, feeling faint or lightheaded, blurry vision Shortness of  breath Side effects that usually do not require medical attention (report to your care team if they continue or are bothersome): Flushing Headache Joint pain Muscle pain Nausea Pain, redness, or irritation at injection site This list may not describe all possible side effects. Call your doctor for medical advice about side effects. You may report side effects to FDA at 1-800-FDA-1088. Where should I keep my medication? This medication is given in a hospital or clinic. It will not be stored at home. NOTE: This sheet is a summary. It may not cover all possible information. If you have questions about this medicine, talk to your doctor, pharmacist, or health care provider.  2024 Elsevier/Gold Standard (2023-02-23 00:00:00)     To help prevent nausea and vomiting after your treatment, we encourage you to take your nausea medication as directed.  BELOW ARE SYMPTOMS THAT SHOULD BE REPORTED IMMEDIATELY: *FEVER GREATER THAN 100.4 F (38 C) OR HIGHER *CHILLS OR SWEATING *NAUSEA AND VOMITING THAT IS NOT CONTROLLED WITH YOUR NAUSEA MEDICATION *UNUSUAL SHORTNESS OF BREATH *UNUSUAL BRUISING OR BLEEDING *URINARY PROBLEMS (pain or burning when urinating, or frequent urination) *BOWEL PROBLEMS (unusual diarrhea, constipation, pain near the anus) TENDERNESS IN MOUTH AND THROAT WITH OR WITHOUT PRESENCE OF ULCERS (sore throat, sores in mouth, or a toothache) UNUSUAL RASH, SWELLING OR PAIN  UNUSUAL VAGINAL DISCHARGE OR ITCHING   Items with * indicate a potential emergency and should be followed up as soon as possible or go to the Emergency Department if any problems should occur.  Please show the CHEMOTHERAPY ALERT CARD or IMMUNOTHERAPY ALERT CARD at check-in to the Emergency Department and triage nurse.  Should you have questions after your visit or need to cancel or reschedule your appointment, please contact [facility] [phone]  and follow the prompts.  Office hours are  8:00 a.m. to 4:30 p.m. Monday - Friday. Please note that voicemails left after 4:00 p.m. may not be returned until the following business day.  We are closed weekends and major holidays. You have access to a nurse at all times for urgent questions. Please call the main number to the clinic [phone] and follow the prompts.  For any non-urgent questions, you may also contact your provider using MyChart. We now offer e-Visits for anyone 55 and older to request care online for non-urgent symptoms. For details visit [email or web address].   Also download the MyChart app! Go to the app store, search "MyChart", open the app, select [facility], and log in with your MyChart username and password.

## 2023-06-14 NOTE — Progress Notes (Signed)
Patient presents today for iron infusion.  Patient reports taking premedications of benadryl and tylenol at 1400 today prior to arrival. Vital signs are stable. Patient c/o possible dehydration and hypokalemia due to diarrhea. Patient states she PCP is aware and she is scheduled for a colonoscopy. Durenda Hurt PA aware, labs ordered. Labs within normal limits. Patient to get 1 liter bolus of normal saline over an hour for hydration therpay per Durenda Hurt PA. We will proceed with infusion per provider orders.   Patient tolerated treatment well with no complaints voiced.  Patient left via wheelchair in stable condition.  Vital signs stable at discharge.  Follow up as scheduled.

## 2023-06-18 ENCOUNTER — Other Ambulatory Visit: Payer: Self-pay

## 2023-06-18 ENCOUNTER — Ambulatory Visit (AMBULATORY_SURGERY_CENTER): Payer: Medicare HMO | Admitting: Gastroenterology

## 2023-06-18 ENCOUNTER — Ambulatory Visit: Payer: Medicare HMO | Admitting: Orthopedic Surgery

## 2023-06-18 ENCOUNTER — Encounter: Payer: Self-pay | Admitting: Gastroenterology

## 2023-06-18 VITALS — BP 90/50 | HR 72 | Temp 99.5°F | Resp 15 | Ht <= 58 in | Wt 72.0 lb

## 2023-06-18 DIAGNOSIS — D500 Iron deficiency anemia secondary to blood loss (chronic): Secondary | ICD-10-CM

## 2023-06-18 DIAGNOSIS — Z1211 Encounter for screening for malignant neoplasm of colon: Secondary | ICD-10-CM | POA: Diagnosis not present

## 2023-06-18 DIAGNOSIS — R195 Other fecal abnormalities: Secondary | ICD-10-CM | POA: Diagnosis not present

## 2023-06-18 MED ORDER — ATORVASTATIN CALCIUM 20 MG PO TABS: 20.0000 mg | ORAL_TABLET | Freq: Every day | ORAL | 0 refills | Status: DC

## 2023-06-18 MED ORDER — SODIUM CHLORIDE 0.9 % IV SOLN: 500.0000 mL | INTRAVENOUS | Status: DC

## 2023-06-18 NOTE — Progress Notes (Signed)
History and Physical:  This patient presents for endoscopic testing for: Encounter Diagnoses  Name Primary?   Iron deficiency anemia due to chronic blood loss Yes   Heme positive stool     Seen in office June 2024 for chronic IDA, heme pos stool Hgb EGD/colonoscopy/VCE for same in 2022 Periodically dropping Hgb and increasing IV iron requirements.  Patient initially declined repeat EGD /colonoscopy when seen in June, recently contacted our office requesting to proceed.  Patient is otherwise without complaints or active issues today.   Past Medical History: Past Medical History:  Diagnosis Date   Anemia    Anxiety    Arthritis    RA   Cataract    CHF (congestive heart failure) (HCC)    Collagen vascular disease (HCC)    Depression    Endometriosis    Heart murmur    High cholesterol    Hypertension    IBS (irritable bowel syndrome)    Lymphadenopathy    Mitral valve prolapse    Osteoporosis    RA (rheumatoid arthritis) (HCC)    Rotator cuff tear    bilateral   Stroke (HCC) 06/2022   Uterus, adenomyosis      Past Surgical History: Past Surgical History:  Procedure Laterality Date   AXILLARY LYMPH NODE BIOPSY Right 10/12/2020   Procedure: RIGHT AXILLARY LYMPH NODE BIOPSY EXCISION;  Surgeon: Almond Lint, MD;  Location: [facility];  Service: General;  Laterality: Right;   BUNIONECTOMY Right    CARPAL TUNNEL RELEASE Right    COLONOSCOPY     COLONOSCOPY N/A 12/13/2015   Procedure: COLONOSCOPY;  Surgeon: Malissa Hippo, MD;  Location: [facility];  Service: Endoscopy;  Laterality: N/A;  9:55   Fatty tumor     2011 (left arm)   TONSILLECTOMY     TOTAL ABDOMINAL HYSTERECTOMY     2003    Allergies: Allergies  Allergen Reactions   Abatacept Other (See Comments)   Codeine Other (See Comments)    Stomach pains/constipation- due to IBS   Golimumab Other (See Comments)   Hydrocodone Other (See Comments)    She does not want to take this due to  intolerance to codeine.   Methylprednisolone Diarrhea and Other (See Comments)    "Made my IBS flare up"  Diarrhea, nausea   Methotrexate Derivatives Other (See Comments)    Agitation; pt stated, "I get no sleep; one dose caused no sleep for 7 days and 7 nights - that was when I got a 0.5 injection"   Prednisone Rash    Cannot take prednisone by mouth ---- red rash all over    Outpatient Meds: Current Outpatient Medications  Medication Sig Dispense Refill   acetaminophen-codeine (TYLENOL #3) 300-30 MG tablet Take 1 tablet by mouth daily as needed for moderate pain. 30 tablet 0   ASPIRIN 81 PO Take 1 tablet by mouth daily.     atorvastatin (LIPITOR) 20 MG tablet Take 1 tablet (20 mg total) by mouth daily. 30 tablet 0   Cholecalciferol (VITAMIN D3) 25 MCG (1000 UT) CAPS 1 capsule Orally Once a day for 30 day(s)     cyanocobalamin (,VITAMIN B-12,) 1000 MCG/ML injection Inject 1,000 mcg into the skin every 30 (thirty) days.     diclofenac Sodium (VOLTAREN) 1 % GEL SMARTSIG:Gram(s) Topical 4 Times Daily PRN     docusate sodium (COLACE) 100 MG capsule Take 1 capsule (100 mg total) by mouth 2 (two) times daily. 60 capsule 2  fentaNYL (DURAGESIC) 50 MCG/HR Place onto the skin every 3 (three) days.     FORTEO 600 MCG/2.4ML SOPN ADMINISTER 0.08 ML UNDER THE SKIN DAILY 2.4 mL 0   Heat Wraps (THERMACARE BACK/HIP) MISC 1 application by Does not apply route daily. 3 each 11   hydroxychloroquine (PLAQUENIL) 200 MG tablet Take 1/2 tablet by mouth daily. 15 tablet 2   Insulin Pen Needle (SURE COMFORT PEN NEEDLES) 30G X 8 MM MISC Use as directed 100 each 1   lidocaine (LIDODERM) 5 % one patch daily.     lipase/protease/amylase (CREON) 12000-38000 units CPEP capsule Use 1 capsule with meals and snacks 270 capsule 1   magnesium oxide (MAG-OX) 400 (240 Mg) MG tablet Take 1 tablet by mouth daily.     Na Sulfate-K Sulfate-Mg Sulf 17.5-3.13-1.6 GM/177ML SOLN Take 1 kit by mouth as directed. 354 mL 0    ondansetron (ZOFRAN-ODT) 4 MG disintegrating tablet Take 4 mg by mouth every 8 (eight) hours as needed.     pantoprazole (PROTONIX) 40 MG tablet TAKE 1 TABLET(40 MG) BY MOUTH TWICE DAILY 180 tablet 3   PARoxetine (PAXIL) 20 MG tablet Take 20 mg by mouth daily.     Syringe/Needle, Disp, (SYRINGE 3CC/23GX1") 23G X 1" 3 ML MISC by Does not apply route.     Teriparatide, Recombinant, (FORTEO) 600 MCG/2.4ML SOPN INJECT 20 MCG UNDER THE SKIN (SUBCUTANEOUS INJECTION) DAILY 2.4 mL 5   traZODone (DESYREL) 100 MG tablet Take 1 tablet (100 mg total) by mouth at bedtime as needed for sleep. 30 tablet 6   acetaminophen (TYLENOL) 500 MG tablet Take 500 mg by mouth every 6 (six) hours as needed.     alendronate (FOSAMAX) 70 MG tablet  (Patient not taking: Reported on 06/18/2023)     BD SYRINGE SLIP TIP 25G X 5/8" 1 ML MISC USE AS DIRECTED FOR ONCE MONTHLY B12 INJECTIONS 10 each 0   cyclobenzaprine (FLEXERIL) 5 MG tablet TAKE 1 TABLET(5 MG) BY MOUTH AT BEDTIME AS NEEDED FOR MUSCLE SPASMS (Patient not taking: Reported on 06/18/2023) 30 tablet 0   dicyclomine (BENTYL) 10 MG capsule Take 1 capsule (10 mg total) by mouth 4 (four) times daily as needed for spasms. (Patient not taking: Reported on 06/18/2023) 360 capsule 1   DULoxetine (CYMBALTA) 20 MG capsule Take 20 mg by mouth 2 (two) times daily. (Patient not taking: Reported on 06/14/2023)     furosemide (LASIX) 20 MG tablet Take 20 mg by mouth daily as needed. (Patient not taking: Reported on 06/18/2023)     hydrOXYzine (ATARAX) 25 MG tablet Take 25 mg by mouth 2 (two) times daily as needed for itching. (Patient not taking: Reported on 06/18/2023)     hydrOXYzine (ATARAX) 50 MG tablet Take by mouth. (Patient not taking: Reported on 06/18/2023)     LORazepam (ATIVAN) 0.5 MG tablet TAKE 1/2 TO 1 TABLET BY MOUTH AT BEDTIME AS NEEDED (Patient not taking: Reported on 06/18/2023) 60 tablet 0   megestrol (MEGACE) 40 MG tablet Take 1 tablet (40 mg total) by mouth daily. (Patient  not taking: Reported on 06/18/2023) 30 tablet 6   metoprolol tartrate (LOPRESSOR) 25 MG tablet      MYRBETRIQ 25 MG TB24 tablet Take 25 mg by mouth daily.     naloxone (NARCAN) nasal spray 4 mg/0.1 mL SMARTSIG:Both Nares (Patient not taking: Reported on 06/18/2023)     naratriptan (AMERGE) 2.5 MG tablet Take 1 tablet (2.5 mg total) by mouth as needed for migraine. Take one (  1) tablet at onset of headache; if returns or does not resolve, may repeat after 4 hours; do not exceed five (5) mg in 24 hours. (Patient not taking: Reported on 06/18/2023) 10 tablet 6   pregabalin (LYRICA) 25 MG capsule Take 1 capsule (25 mg total) by mouth 2 (two) times daily. (Patient not taking: Reported on 06/18/2023) 60 capsule 5   promethazine (PHENERGAN) 25 MG tablet Take 0.5-1 tablets (12.5-25 mg total) by mouth every 6 (six) hours as needed for nausea or vomiting. 20 tablet 0   Current Facility-Administered Medications  Medication Dose Route Frequency Provider Last Rate Last Admin   0.9 %  sodium chloride infusion  500 mL Intravenous Continuous Danis, Starr Lake III, MD          ___________________________________________________________________ Objective   Exam:  BP (!) 93/57   Pulse (!) 101   Temp 99.5 F (37.5 C) (Skin)   Ht 4\' 8"  (1.422 m)   Wt 72 lb (32.7 kg)   SpO2 98%   BMI 16.14 kg/m   CV: regular , S1/S2 Resp: clear to auscultation bilaterally, normal RR and effort noted GI: soft, no tenderness, with active bowel sounds.   Assessment: Encounter Diagnoses  Name Primary?   Iron deficiency anemia due to chronic blood loss Yes   Heme positive stool      Plan: Colonoscopy EGD  The benefits and risks of the planned procedure were described in detail with the patient or (when appropriate) their health care proxy.  Risks were outlined as including, but not limited to, bleeding, infection, perforation, adverse medication reaction leading to cardiac or pulmonary decompensation, pancreatitis (if  ERCP).  The limitation of incomplete mucosal visualization was also discussed.  No guarantees or warranties were given.  The patient is appropriate for an endoscopic procedure in the ambulatory setting.   - Vanessa Jupiter, MD

## 2023-06-18 NOTE — Patient Instructions (Signed)
Resume previous diet Continue present medications Await pathology results  Handouts/information given for Iron Deficiency Anemia  YOU HAD AN ENDOSCOPIC PROCEDURE TODAY AT THE [facility]:   Refer to the procedure report that was given to you for any specific questions about what was found during the examination.  If the procedure report does not answer your questions, please call your gastroenterologist to clarify.  If you requested that your care partner not be given the details of your procedure findings, then the procedure report has been included in a sealed envelope for you to review at your convenience later.  YOU SHOULD EXPECT: Some feelings of bloating in the abdomen. Passage of more gas than usual.  Walking can help get rid of the air that was put into your GI tract during the procedure and reduce the bloating. If you had a lower endoscopy (such as a colonoscopy or flexible sigmoidoscopy) you may notice spotting of blood in your stool or on the toilet paper. If you underwent a bowel prep for your procedure, you may not have a normal bowel movement for a few days.  Please Note:  You might notice some irritation and congestion in your nose or some drainage.  This is from the oxygen used during your procedure.  There is no need for concern and it should clear up in a day or so.  SYMPTOMS TO REPORT IMMEDIATELY:  Following lower endoscopy (colonoscopy):  Excessive amounts of blood in the stool  Significant tenderness or worsening of abdominal pains  Swelling of the abdomen that is new, acute  Fever of 100F or higher Following upper endoscopy (EGD)  Vomiting of blood or coffee ground material  New chest pain or pain under the shoulder blades  Painful or persistently difficult swallowing  New shortness of breath  Black, tarry-looking stools  For urgent or emergent issues, a gastroenterologist can be reached at any hour by calling [phone]. Do not use MyChart messaging  for urgent concerns.    DIET:  We do recommend a small meal at first, but then you may proceed to your regular diet.  Drink plenty of fluids but you should avoid alcoholic beverages for 24 hours.  ACTIVITY:  You should plan to take it easy for the rest of today and you should NOT DRIVE or use heavy machinery until tomorrow (because of the sedation medicines used during the test).    FOLLOW UP: Our staff will call the number listed on your records the next business day following your procedure.  We will call around 7:15- 8:00 am to check on you and address any questions or concerns that you may have regarding the information given to you following your procedure. If we do not reach you, we will leave a message.     If any biopsies were taken you will be contacted by phone or by letter within the next 1-3 weeks.  Please call us at [phone] if you have not heard about the biopsies in 3 weeks.    SIGNATURES/CONFIDENTIALITY: You and/or your care partner have signed paperwork which will be entered into your electronic medical record.  These signatures attest to the fact that that the information above on your After Visit Summary has been reviewed and is understood.  Full responsibility of the confidentiality of this discharge information lies with you and/or your care-partner.

## 2023-06-18 NOTE — Op Note (Signed)
[facility] Patient Name: Vanessa Lara Procedure Date: 06/18/2023 2:11 PM MRN: [id number] Endoscopist: Sherilyn Cooter L. Myrtie Neither , MD, [id number] Age: 67 Referring MD:  Date of Birth: 1956-08-04 Gender: Female Account #: [id number] Procedure:                Colonoscopy Indications:              Heme positive stool, Unexplained iron deficiency                            anemia                           see June 2024 office GI consult note for details Medicines:                Monitored Anesthesia Care Procedure:                Pre-Anesthesia Assessment:                           - Prior to the procedure, a History and Physical                            was performed, and patient medications and                            allergies were reviewed. The patient's tolerance of                            previous anesthesia was also reviewed. The risks                            and benefits of the procedure and the sedation                            options and risks were discussed with the patient.                            All questions were answered, and informed consent                            was obtained. Prior Anticoagulants: The patient has                            taken no anticoagulant or antiplatelet agents. ASA                            Grade Assessment: III - A patient with severe                            systemic disease. After reviewing the risks and                            benefits, the patient was deemed in satisfactory  condition to undergo the procedure.                           After obtaining informed consent, the colonoscope                            was passed under direct vision. Throughout the                            procedure, the patient's blood pressure, pulse, and                            oxygen saturations were monitored continuously. The                            Olympus [id number]-[id number] (#[id number]) Colonoscope was                             introduced through the anus and advanced to the                            terminal ileum (10cm), with identification of the                            appendiceal orifice and IC valve. The colonoscopy                            was somewhat difficult due to a redundant colon and                            a tortuous colon. Successful completion of the                            procedure was aided by using manual pressure and                            straightening and shortening the scope to obtain                            bowel loop reduction. The patient tolerated the                            procedure well. The quality of the bowel                            preparation was good. The terminal ileum, ileocecal                            valve, appendiceal orifice, and rectum were                            photographed. Scope In: 2:52:49 PM Scope Out: 3:05:13 PM Scope Withdrawal Time: 0 hours 6 minutes 40 seconds  Total Procedure Duration: 0 hours 12  minutes 24 seconds  Findings:                 The digital rectal exam findings include decreased                            sphincter tone.                           The terminal ileum appeared normal.                           The entire examined colon appeared normal on direct                            and retroflexion views. Complications:            No immediate complications. Estimated Blood Loss:     Estimated blood loss: none. Impression:               - Decreased sphincter tone found on digital rectal                            exam.                           - The examined portion of the ileum was normal.                           - The entire examined colon is normal on direct and                            retroflexion views.                           - No specimens collected.                           Clinical suspicion is primarily for small bowel                             AVMs. Recommendation:           - Patient has a contact number available for                            emergencies. The signs and symptoms of potential                            delayed complications were discussed with the                            patient. Return to normal activities tomorrow.                            Written discharge instructions were provided to the  patient.                           - Resume previous diet.                           - Continue present medications.                           - Repeat colonoscopy in 10 years for screening                            purposes.                           - See the other procedure note for documentation of                            additional recommendations.                           - To visualize the small bowel, perform video                            capsule endoscopy at appointment to be scheduled. Jabri Blancett L. Myrtie Neither, MD 06/18/2023 3:18:40 PM This report has been signed electronically.

## 2023-06-18 NOTE — Op Note (Signed)
[facility] Patient Name: Vanessa Lara Procedure Date: 06/18/2023 2:20 PM MRN: [id number] Endoscopist: Sherilyn Cooter L. Myrtie Neither , MD, [id number] Age: 67 Referring MD:  Date of Birth: 08-Feb-1956 Gender: Female Account #: [id number] Procedure:                Upper GI endoscopy Indications:              Iron deficiency anemia secondary to chronic blood                            loss, Heme positive stool                           No source of blood loss found on 2022 EGD,                            colonoscopy or small bowel video capsule study                           Persistent anemia with intermittently dropping                            hemoglobin and increasing iron requirements. Medicines:                Monitored Anesthesia Care Procedure:                Pre-Anesthesia Assessment:                           - Prior to the procedure, a History and Physical                            was performed, and patient medications and                            allergies were reviewed. The patient's tolerance of                            previous anesthesia was also reviewed. The risks                            and benefits of the procedure and the sedation                            options and risks were discussed with the patient.                            All questions were answered, and informed consent                            was obtained. Prior Anticoagulants: The patient has                            taken no anticoagulant or antiplatelet agents. ASA  Grade Assessment: III - A patient with severe                            systemic disease. After reviewing the risks and                            benefits, the patient was deemed in satisfactory                            condition to undergo the procedure.                           After obtaining informed consent, the endoscope was                            passed under direct vision. Throughout  the                            procedure, the patient's blood pressure, pulse, and                            oxygen saturations were monitored continuously. The                            GIF [id number] #[id number] was introduced through the                            mouth, and advanced to the second part of duodenum.                            The upper GI endoscopy was accomplished without                            difficulty. The patient tolerated the procedure                            well. Scope In: Scope Out: Findings:                 The esophagus was normal.                           Diffuse mildly congested mucosa was found in the                            entire examined stomach.                           The exam of the stomach was otherwise normal.                           The cardia and gastric fundus were normal on                            retroflexion.  Normal mucosa was found in the entire duodenum.                            Biopsies for histology were taken with a cold                            forceps for evaluation of celiac disease. Complications:            No immediate complications. Estimated Blood Loss:     Estimated blood loss was minimal. Impression:               - Normal esophagus.                           - Congestive gastropathy.                           - Normal mucosa was found in the entire examined                            duodenum. Biopsied. Recommendation:           - Patient has a contact number available for                            emergencies. The signs and symptoms of potential                            delayed complications were discussed with the                            patient. Return to normal activities tomorrow.                            Written discharge instructions were provided to the                            patient.                           - Resume previous diet.                           -  Continue present medications.                           - Await pathology results.                           - See the other procedure note for documentation of                            additional recommendations. Haidan Nhan L. Myrtie Neither, MD 06/18/2023 3:11:21 PM This report has been signed electronically.

## 2023-06-18 NOTE — Progress Notes (Signed)
Patient asked for our smallest bite block for her EGD. I used the small one, blue and yellow in color. I placed the bite block prior to sedation, with patient stating that her lips and gums were not pinched or hurt. This is my typical practice. During the EGD, I noticed that the bite block became dislodged one time to the side of her mouth. I was able to place it back in place by opening the patient's mouth and place the bite block in the center of her mouth. I noticed a small amount of heme at this time, but thought it was likely from her lower front teeth creating pressure on the bite block or on her tongue during the temporary displacement. I also noticed that she required a jaw thrust to have the bite block remain in a good position, so I provided this throughout the remainder of the EGD. At the end of the EGD, I removed the bite block gently, and the patient's upper front bridge also came out, in one piece. I informed the MD. Dr. Myrtie Neither in my presense also informed the patient of this once she was in recovery, awake and able to participate in a conversation. With further investigation into her mouth, the remnants of her upper gums area looked very decayed. Patient stated that she had been wanting to take care of her mouth for some time now, but wanted to take care of her present issues (IDA, reasons for her to be at [facility]) first.  Otherwise, uneventful anesthetic. Report to pacu rn. Vss with BP close to baseline. Care resumed by rn.

## 2023-06-18 NOTE — Progress Notes (Signed)
Patient states there have been no changes to medical or surgical history since time of pre-visit. 

## 2023-06-18 NOTE — Progress Notes (Signed)
Called to room to assist during endoscopic procedure.  Patient ID and intended procedure confirmed with present staff. Received instructions for my participation in the procedure from the performing physician.  

## 2023-06-19 ENCOUNTER — Telehealth: Payer: Self-pay | Admitting: *Deleted

## 2023-06-19 NOTE — Telephone Encounter (Signed)
  Follow up Call-     06/18/2023    1:45 PM 11/26/2020    9:22 AM  Call back number  Post procedure Call Back phone  # [phone]  Permission to leave phone message No Yes     Patient questions:  Do you have a fever, pain , or abdominal swelling? No. Pain Score  0 *  Have you tolerated food without any problems? Yes.    Have you been able to return to your normal activities? Yes.    Do you have any questions about your discharge instructions: Diet   No. Medications  No. Follow up visit  No.  Do you have questions or concerns about your Care? No.  Actions: * If pain score is 4 or above: No action needed, pain <4.

## 2023-06-21 ENCOUNTER — Encounter: Payer: Medicare HMO | Admitting: Gastroenterology

## 2023-06-21 ENCOUNTER — Encounter: Payer: Self-pay | Admitting: Hematology

## 2023-06-21 ENCOUNTER — Telehealth: Payer: Self-pay | Admitting: *Deleted

## 2023-06-21 NOTE — Telephone Encounter (Signed)
Patient called to advise that she has absolutely no energy and feels unwell in general.  Would like 1 more dose of Venofer 200 mg IV.  Discussed case with Durenda Hurt, who agreed with another dose and will recheck labs at next visit.

## 2023-06-21 NOTE — Telephone Encounter (Signed)
Patient called to advise that she does not feel any better, despite receiving 2 doses of Venofer 200 mg IV.  States she has no energy and is extremely weak.  Is requesting an additional infusion.  Case discussed with Durenda Hurt, NP and will schedule for another infusion.  Scheduled and patient is aware.

## 2023-06-22 ENCOUNTER — Ambulatory Visit: Payer: Medicare HMO | Admitting: Surgical

## 2023-06-22 LAB — SURGICAL PATHOLOGY

## 2023-06-23 ENCOUNTER — Encounter (HOSPITAL_COMMUNITY): Payer: Self-pay

## 2023-06-26 ENCOUNTER — Other Ambulatory Visit: Payer: Self-pay

## 2023-06-26 ENCOUNTER — Encounter: Payer: Self-pay | Admitting: Gastroenterology

## 2023-06-26 ENCOUNTER — Other Ambulatory Visit (HOSPITAL_COMMUNITY): Payer: Self-pay

## 2023-06-26 ENCOUNTER — Encounter (HOSPITAL_COMMUNITY): Payer: Self-pay

## 2023-06-26 NOTE — Progress Notes (Signed)
Specialty Pharmacy Refill Coordination Note  AMATULLAH KASPARIAN is a 67 y.o. female contacted today regarding refills of specialty medication(s) Teriparatide (Recombinant) .  Patient requested Delivery  on 06/28/23  to verified address [address]  [place] [address]   Medication will be filled on 06/27/23.   Specialty Pharmacy Ongoing Clinical Assessment Note  LAMONDA YAMAMURA is a 67 y.o. female who is being followed by the specialty pharmacy service for RxSp Osteoporosis   Review of patient's specialty medication(s) Teriparatide (Recombinant)  occurred today.   Patient has missed 0  doses in the last 4 weeks.   Patient did not have any additional questions or concerns.   Therapeutic benefit summary: Patient is achieving benefit   Adverse events/side effects summary: No adverse events/side effects   Patient's therapy is appropriate to : Continue    Goals      Prevent or reduce bone loss     Patient is on track . Patient will maintain adherence         Follow up:  6 months

## 2023-06-27 ENCOUNTER — Inpatient Hospital Stay: Payer: Medicare HMO

## 2023-06-27 ENCOUNTER — Ambulatory Visit: Payer: Medicare HMO | Admitting: Surgical

## 2023-06-27 VITALS — BP 93/58 | HR 97 | Temp 97.0°F | Resp 19 | Wt <= 1120 oz

## 2023-06-27 DIAGNOSIS — D500 Iron deficiency anemia secondary to blood loss (chronic): Secondary | ICD-10-CM

## 2023-06-27 DIAGNOSIS — D509 Iron deficiency anemia, unspecified: Secondary | ICD-10-CM | POA: Diagnosis not present

## 2023-06-27 MED ORDER — SODIUM CHLORIDE 0.9 % IV SOLN
200.0000 mg | Freq: Once | INTRAVENOUS | Status: AC
  Administered 2023-06-27: 200 mg via INTRAVENOUS
  Filled 2023-06-27: qty 10

## 2023-06-27 MED ORDER — SODIUM CHLORIDE 0.9 % IV SOLN: Freq: Once | INTRAVENOUS | Status: AC

## 2023-06-27 NOTE — Progress Notes (Signed)
Patient presents today for iron infusion.  Patient is in satisfactory condition with no new complaints voiced.  Vital signs are stable.  IV placed in L arm. IV flushed well with good blood return noted.  Tylenol and Zyrtec taken at 0830 at home prior to visit.  We will proceed with infusion per provider orders.   BP post iron was 87/47.  Rojelio Brenner, PA-C made aware.  We will give 500 mL of NS over one hour.

## 2023-06-27 NOTE — Patient Instructions (Signed)
[facility]  Discharge Instructions: Thank you for choosing  Cancer Center to provide your oncology and hematology care.  If you have a lab appointment with the Cancer Center - please note that after April 8th, 2024, all labs will be drawn in the cancer center.  You do not have to check in or register with the main entrance as you have in the past but will complete your check-in in the cancer center.  Wear comfortable clothing and clothing appropriate for easy access to any Portacath or PICC line.   We strive to give you quality time with your provider. You may need to reschedule your appointment if you arrive late (15 or more minutes).  Arriving late affects you and other patients whose appointments are after yours.  Also, if you miss three or more appointments without notifying the office, you may be dismissed from the clinic at the provider's discretion.      For prescription refill requests, have your pharmacy contact our office and allow 72 hours for refills to be completed.    Today you received the following:  Venofer 200 mg.  Iron Sucrose Injection What is this medication? IRON SUCROSE (EYE ern SOO krose) treats low levels of iron (iron deficiency anemia) in people with kidney disease. Iron is a mineral that plays an important role in making red blood cells, which carry oxygen from your lungs to the rest of your body. This medicine may be used for other purposes; ask your health care provider or pharmacist if you have questions. COMMON BRAND NAME(S): Venofer What should I tell my care team before I take this medication? They need to know if you have any of these conditions: Anemia not caused by low iron levels Heart disease High levels of iron in the blood Kidney disease Liver disease An unusual or allergic reaction to iron, other medications, foods, dyes, or preservatives Pregnant or trying to get pregnant Breastfeeding How should I use this  medication? This medication is for infusion into a vein. It is given in a hospital or clinic setting. Talk to your care team about the use of this medication in children. While this medication may be prescribed for children as young as 2 years for selected conditions, precautions do apply. Overdosage: If you think you have taken too much of this medicine contact a poison control center or emergency room at once. NOTE: This medicine is only for you. Do not share this medicine with others. What if I miss a dose? Keep appointments for follow-up doses. It is important not to miss your dose. Call your care team if you are unable to keep an appointment. What may interact with this medication? Do not take this medication with any of the following: Deferoxamine Dimercaprol Other iron products This medication may also interact with the following: Chloramphenicol Deferasirox This list may not describe all possible interactions. Give your health care provider a list of all the medicines, herbs, non-prescription drugs, or dietary supplements you use. Also tell them if you smoke, drink alcohol, or use illegal drugs. Some items may interact with your medicine. What should I watch for while using this medication? Visit your care team regularly. Tell your care team if your symptoms do not start to get better or if they get worse. You may need blood work done while you are taking this medication. You may need to follow a special diet. Talk to your care team. Foods that contain iron include: whole grains/cereals, dried  fruits, beans, or peas, leafy green vegetables, and organ meats (liver, kidney). What side effects may I notice from receiving this medication? Side effects that you should report to your care team as soon as possible: Allergic reactions--skin rash, itching, hives, swelling of the face, lips, tongue, or throat Low blood pressure--dizziness, feeling faint or lightheaded, blurry vision Shortness of  breath Side effects that usually do not require medical attention (report to your care team if they continue or are bothersome): Flushing Headache Joint pain Muscle pain Nausea Pain, redness, or irritation at injection site This list may not describe all possible side effects. Call your doctor for medical advice about side effects. You may report side effects to FDA at 1-800-FDA-1088. Where should I keep my medication? This medication is given in a hospital or clinic. It will not be stored at home. NOTE: This sheet is a summary. It may not cover all possible information. If you have questions about this medicine, talk to your doctor, pharmacist, or health care provider.  2024 Elsevier/Gold Standard (2023-02-23 00:00:00)     To help prevent nausea and vomiting after your treatment, we encourage you to take your nausea medication as directed.  BELOW ARE SYMPTOMS THAT SHOULD BE REPORTED IMMEDIATELY: *FEVER GREATER THAN 100.4 F (38 C) OR HIGHER *CHILLS OR SWEATING *NAUSEA AND VOMITING THAT IS NOT CONTROLLED WITH YOUR NAUSEA MEDICATION *UNUSUAL SHORTNESS OF BREATH *UNUSUAL BRUISING OR BLEEDING *URINARY PROBLEMS (pain or burning when urinating, or frequent urination) *BOWEL PROBLEMS (unusual diarrhea, constipation, pain near the anus) TENDERNESS IN MOUTH AND THROAT WITH OR WITHOUT PRESENCE OF ULCERS (sore throat, sores in mouth, or a toothache) UNUSUAL RASH, SWELLING OR PAIN  UNUSUAL VAGINAL DISCHARGE OR ITCHING   Items with * indicate a potential emergency and should be followed up as soon as possible or go to the Emergency Department if any problems should occur.  Please show the CHEMOTHERAPY ALERT CARD or IMMUNOTHERAPY ALERT CARD at check-in to the Emergency Department and triage nurse.  Should you have questions after your visit or need to cancel or reschedule your appointment, please contact [facility] [phone]  and follow the prompts.  Office hours are  8:00 a.m. to 4:30 p.m. Monday - Friday. Please note that voicemails left after 4:00 p.m. may not be returned until the following business day.  We are closed weekends and major holidays. You have access to a nurse at all times for urgent questions. Please call the main number to the clinic [phone] and follow the prompts.  For any non-urgent questions, you may also contact your provider using MyChart. We now offer e-Visits for anyone 11 and older to request care online for non-urgent symptoms. For details visit [email or web address].   Also download the MyChart app! Go to the app store, search "MyChart", open the app, select [facility], and log in with your MyChart username and password.

## 2023-06-27 NOTE — Progress Notes (Signed)
Venofer 200mg   given today per MD orders. Tolerated infusion without adverse affects. Vital signs stable. No complaints at this time. Discharged from clinic by wheel chair in stable condition. Blood pressure 93/58 on discharge. Patient denies any dizziness or blurred vision or feeling faint. Patient's blood pressure is baseline per patient's words. Alert and oriented x 3. F/U with [facility] as scheduled.

## 2023-06-28 ENCOUNTER — Telehealth: Payer: Self-pay

## 2023-06-28 ENCOUNTER — Telehealth: Payer: Self-pay | Admitting: Surgical

## 2023-06-28 NOTE — Telephone Encounter (Signed)
Patient would like a call back. Stated that her foot is hurting.  CB# [phone].  Please advise.  Thank you.

## 2023-06-28 NOTE — Telephone Encounter (Signed)
Pt called asking if Dr. Franky Macho could order x rays at [facility]. Pt stated she had an appt yesterday to see Dr. Franky Macho but couldn't make it due to blood pressure issues. She would like to go to emergency room to have x rays done on R foot please.  Vanessa Lara [phone]

## 2023-06-29 NOTE — Telephone Encounter (Signed)
Pending response from [place].

## 2023-07-02 ENCOUNTER — Telehealth: Payer: Self-pay

## 2023-07-02 NOTE — Telephone Encounter (Signed)
She can just have xrays at urgent care or ER, I dont need to order them.  If there is concern, I can look at them after she has them done and see what they show

## 2023-07-02 NOTE — Telephone Encounter (Signed)
Duplicate see other note from [place]. Will call patient and advise.

## 2023-07-02 NOTE — Telephone Encounter (Signed)
Patient called again stating that she needs an x-ray of her right ankle/foot.  Please advise.  Cb# [phone]. Thank you

## 2023-07-04 ENCOUNTER — Ambulatory Visit: Payer: Medicare HMO | Attending: Internal Medicine | Admitting: Internal Medicine

## 2023-07-04 ENCOUNTER — Encounter: Payer: Self-pay | Admitting: Internal Medicine

## 2023-07-04 VITALS — BP 94/62 | HR 105 | Resp 14 | Ht 60.0 in | Wt <= 1120 oz

## 2023-07-04 DIAGNOSIS — E559 Vitamin D deficiency, unspecified: Secondary | ICD-10-CM

## 2023-07-04 DIAGNOSIS — M0579 Rheumatoid arthritis with rheumatoid factor of multiple sites without organ or systems involvement: Secondary | ICD-10-CM | POA: Diagnosis not present

## 2023-07-04 DIAGNOSIS — Z79899 Other long term (current) drug therapy: Secondary | ICD-10-CM | POA: Diagnosis not present

## 2023-07-04 DIAGNOSIS — M150 Primary generalized (osteo)arthritis: Secondary | ICD-10-CM

## 2023-07-04 DIAGNOSIS — M810 Age-related osteoporosis without current pathological fracture: Secondary | ICD-10-CM

## 2023-07-04 DIAGNOSIS — D509 Iron deficiency anemia, unspecified: Secondary | ICD-10-CM

## 2023-07-04 NOTE — Patient Instructions (Signed)

## 2023-07-04 NOTE — Progress Notes (Signed)
Office Visit Note  Patient: Vanessa Lara             Date of Birth: [birth year]           MRN: [id number]             PCP: [name], NP Referring: [name], NP Visit Date: 07/04/2023   Subjective:  Follow-up (Patient states she would like for the doctor to look at her right ankle. Patient states she is still trying to get her iron and anemia taken care of. )   History of Present Illness: Vanessa Lara is a 67 y.o. female here for follow up for seropositive RA and osteoarthritis and osteoporosis she stopped hydroxychloroquine after last visit due to concern about cardiac arrhythmia alongside Paxil and duloxetine.  She was only able to take a low-dose of the medication she did feel it improved her overall condition but still had persistent arthritis of numerous areas regardless.  She had an updated bone density scan on July 15 still with osteoporosis of the lumbar spine although better than 2022 and with femur osteopenia similar to the previous test.  She is taking Venofer for iron deficiency anemia this ongoing to correct it with her goal of enough to tolerate foot surgery.  She underwent endoscopy with no upper or lower GI findings to account for heme positive stool. Acute problem today increased right ankle pain and swelling started since 3 weeks ago.  She did not recall any fall trip or other trauma to that area.  Initially there was a high degree of swelling he put a wrap around this and can see prominent indentation the swelling has largely decreased compared to the outset but still has pain with direct pressure and with weightbearing.  Imaging reviewed DEXA Review [facility] Spine L1-L2 04/16/23  -3.8  0.712 g/cm2 04/12/21  -4.5  0.627 g/cm2 04/13/20  -4.2 0.665 g/cm2  DualFemur Total Mean 04/16/23  -2.6 0.680 g/cm2 04/12/21  -2.6 0.676 g/cm2 04/13/20  -2.2 0.736 g/cm2   03/08/23 Vanessa Lara is a 67 y.o. female here for follow up for seropositive RA.  Currently off any  disease specific medication she cannot tolerate taking the sulfasalazine due to severe stomach irritation described as burning up her stomach.  Continues having joint pain and stiffness on a daily basis overall feels her function is very poor due to the severity.  Had labs last month still anemic at hemoglobin of 9.8 being treated with infusion for iron deficiency anemia.   01/16/23 Vanessa Lara is a 67 y.o. female here for follow up for seropositive RA.  Currently health maintenance treatment after discontinuing 5 mg prednisone is on medication for joint pain for RA along with osteoarthritis and chronic pain syndrome.  Had recent knee steroid injection since her last visit but reports overall widespread symptoms are severe.  Particularly is been noticing some increased trouble with pain and some swelling involving the wrist worse on left side.   10/03/22 Vanessa Lara is a 67 y.o. female here for follow up for seropositive RA currently off long term DMARD with prednisone 5 mg daily as needed.  She is overall not doing well feels like pain and stiffness is a big problem but more severe is her fatigue and feels a generalized getting weaker with difficulty moving and with her upper body strength.  She pretty consistently has to take the prednisone for otherwise does not move around much during the day.  Continues to  get a benefit with the Flexeril at nighttime with slight decrease in frequency of awakenings.  She is also concerned about getting an update to her bone density testing ongoing oral alendronate treatment.  She is also still having a lot of pain and difficulty with walking from her claw toe deformities on the foot and planning to follow-up with surgery clinic about management of this.   06/28/22 Vanessa Lara is a 67 y.o. female here for follow up for seropositive RA now off of treatment after stopping the [place].  Since she stopped taking the medication her complaint of night sweats and fatigue  have improved. She has seen a worsening of joint pain in several areas particularly around the MCP joints of both hands. For knee pain she saw orthopedic surgery clinic yesterday and had injections on both sides.  She also discussed getting set up to do right SI joint steroid injection.  Biggest complaint today is back pain on the right side around the middle of the back and around the scapula.  This bothers her especially with certain movements and trying to bend side to side.    05/18/2022  Vanessa Lara is a 67 y.o. female here for follow up for seropositive rheumatoid arthritis on Kevzara 150 mg [place] q14days. She feels arthritis has been fairly well controlled but has pain worse in her left foot mostly. She had GI issues suspected UTI but with negative culture since last visit. She feels a sensation "like her brain is numb." Also has ongoing upper neck pain without radiating symptoms.    02/07/2022 Vanessa Lara is a 67 y.o. female here for follow up for rheumatoid arthritis with chronic joint pain in multiple areas and chronic fatigue labs showing elevated rheumatoid factor. Kevzara new start on 12/15/2021. She felt an improvement when taking the injections within first weeks. Accidentally took the shots twice in a week due to forgetfulness or mistake but did not notice any major problems. She called in on account of increased joint pain but felt no improvement with the prescribed medrol dose pack. She did feel more lightheadedness or off balance feeling while taking this so stopped after about 3 days. Currently back pain and her left foot pain are worst issues.   Previous HPI 12/05/2021 Vanessa Lara is a 67 y.o. female here for evaluation of rheumatoid arthritis with chronic joint pain in multiple areas and chronic fatigue labs showing elevated rheumatoid factor. She has history of mitral valve prolapse, severe osteoporosis, osteoarthritis, and severe weight loss and deconditioning. She was diagnosed  with seropositive RA in 2018 with Dr. Deanne Coffer and most recently saw Dr. Dierdre Forth in 11/2020. At that time recommended to start hydroxychloroquine for inflammatory arthritis symptoms. She previously tried methotrexate, simponi aria, and orencia with poor tolerance. Imaging consistent with degenerative arthritis in cervical spine and knee but also with some partial tendon tear in shoulder and some synovitis present in knee on MRI imaging. She has taken multiple medications for pain including NSAIDs, prednisone, gabapentin, tramadol, and hydrocodone or oxycodone. She has a lot of pain in multiple areas but is also severely fatigued. She has significant weight loss down to 70 lbs unintentionally reports chronic IBS issues but no recent changes during this period of weight loss. Joint pain also limits her activity quite a bit with chronic deformities and also ongoing swelling in bilateral hands. She has also developed left sided headache localized above the temporal area. She has a lot of pain at the base  of the skull and occiput without clear radiation of symptoms. She is scheduled to see Dr. Delena Bali for headache evaluation and management 3/28.   DMARD Hx HCQ SSZ MTX Simponi Orencia Kevzara   05/2017 RF 15 CCP 32 HBV neg HCV neg   Review of Systems  Constitutional:  Positive for fatigue.  HENT:  Positive for mouth dryness. Negative for mouth sores.   Eyes:  Positive for dryness.  Respiratory:  Negative for shortness of breath.   Cardiovascular:  Negative for chest pain and palpitations.  Gastrointestinal:  Positive for constipation and diarrhea. Negative for blood in stool.  Endocrine: Positive for increased urination.  Genitourinary:  Positive for involuntary urination.  Musculoskeletal:  Positive for joint pain, gait problem, joint pain, joint swelling, myalgias, muscle weakness, morning stiffness, muscle tenderness and myalgias.  Skin:  Positive for sensitivity to sunlight. Negative for color  change, rash and hair loss.  Allergic/Immunologic: Negative for susceptible to infections.  Neurological:  Positive for dizziness and headaches.  Hematological:  Negative for swollen glands.  Psychiatric/Behavioral:  Positive for depressed mood and sleep disturbance. The patient is nervous/anxious.     PMFS History:  Patient Active Problem List   Diagnosis Date Noted   High risk medication use 01/16/2023   Medication monitoring encounter 12/05/2021   Physical deconditioning 11/11/2021   Elevated serum lactate dehydrogenase (LDH) 08/17/2021   Vitamin D deficiency 08/17/2021   Chronic fatigue 07/25/2021   Vitamin B12 deficiency 07/19/2021   Hardening of the aorta (main artery of the heart) (HCC) 06/21/2021   Solitary lung nodule 06/15/2021   Loss of weight 05/18/2021   Protein-calorie malnutrition, severe (HCC) 05/18/2021   Osteoporosis 06/15/2020   Iron deficiency anemia 04/09/2020   Primary osteoarthritis involving multiple joints 12/31/2018   Rheumatoid arthritis involving multiple sites with positive rheumatoid factor (HCC) 12/31/2018   Irritable bowel syndrome with both constipation and diarrhea 10/28/2018   Constipation 03/14/2016   Mitral valve prolapse 11/23/2015   High cholesterol 11/23/2015    Past Medical History:  Diagnosis Date   Anemia    Anxiety    Arthritis    RA   Bursitis    Cataract    CHF (congestive heart failure) (HCC)    Collagen vascular disease (HCC)    Depression    Endometriosis    Heart murmur    High cholesterol    Hypertension    IBS (irritable bowel syndrome)    Lymphadenopathy    Mitral valve prolapse    Osteoporosis    RA (rheumatoid arthritis) (HCC)    Rotator cuff tear    bilateral   Stroke (HCC) 06/2022   Uterus, adenomyosis     Family History  Problem Relation Age of Onset   Bone cancer Mother    Brain cancer Father    Brain cancer Sister    Breast cancer Sister    Renal Disease Sister    Pancreatic cancer Neg Hx     Esophageal cancer Neg Hx    Stomach cancer Neg Hx    Rectal cancer Neg Hx    Colon polyps Neg Hx    Past Surgical History:  Procedure Laterality Date   AXILLARY LYMPH NODE BIOPSY Right 10/12/2020   Procedure: RIGHT AXILLARY LYMPH NODE BIOPSY EXCISION;  Surgeon: Almond Lint, MD;  Location: [facility];  Service: General;  Laterality: Right;   BUNIONECTOMY Right    CARPAL TUNNEL RELEASE Right    COLONOSCOPY     COLONOSCOPY N/A 12/13/2015  Procedure: COLONOSCOPY;  Surgeon: Malissa Hippo, MD;  Location: [facility];  Service: Endoscopy;  Laterality: N/A;  9:55   Fatty tumor     2011 (left arm)   TONSILLECTOMY     TOTAL ABDOMINAL HYSTERECTOMY     2003   Social History   Social History Narrative   Not on file   Immunization History  Administered Date(s) Administered   Influenza, Quadrivalent, Recombinant, Inj, Pf 07/11/2017     Objective: Vital Signs: BP 94/62 (BP Location: Left Arm, Patient Position: Sitting, Cuff Size: Normal)   Pulse (!) 105   Resp 14   Ht 5' (1.524 m)   Wt 67 lb (30.4 kg)   BMI 13.09 kg/m    Physical Exam Eyes:     Conjunctiva/sclera: Conjunctivae normal.  Cardiovascular:     Rate and Rhythm: Regular rhythm. Tachycardia present.  Pulmonary:     Effort: Pulmonary effort is normal.     Breath sounds: Normal breath sounds.  Lymphadenopathy:     Cervical: No cervical adenopathy.  Skin:    General: Skin is warm and dry.  Neurological:     Mental Status: She is alert.      Musculoskeletal Exam:  Elbows full ROM no tenderness or swelling Wrists full ROM no tenderness or swelling Fingers chronic MCP joint widening right hand with reducible subluxation and lateral deviation, left hand worse lateral deviation and not reducible Knees full ROM patellofemoral crepitus, no palpable effusion Right ankle tenderness to pressure worse on medial side along lower and posterior border of medial malleolus   Investigation: No additional  findings.  Imaging: No results found.  Recent Labs: Lab Results  Component Value Date   WBC 9.8 06/14/2023   HGB 10.6 (L) 06/14/2023   PLT 456 (H) 06/14/2023   NA 139 06/14/2023   K 3.5 06/14/2023   CL 102 06/14/2023   CO2 26 06/14/2023   GLUCOSE 121 (H) 06/14/2023   BUN 10 06/14/2023   CREATININE 0.70 06/14/2023   BILITOT 0.3 06/14/2023   ALKPHOS 142 (H) 06/14/2023   AST 28 06/14/2023   ALT 13 06/14/2023   PROT 7.5 06/14/2023   ALBUMIN 3.6 06/14/2023   CALCIUM 9.1 06/14/2023   GFRAA 92 11/06/2019   QFTBGOLDPLUS NEGATIVE 12/05/2021    Speciality Comments: No specialty comments available.  Procedures:  No procedures performed Allergies: Abatacept, Codeine, Golimumab, Hydrocodone, Methylprednisolone, Methotrexate derivatives, and Prednisone   Assessment / Plan:     Visit Diagnoses: Rheumatoid arthritis involving multiple sites with positive rheumatoid factor (HCC)  Primary osteoarthritis involving multiple joints - Plan: Sedimentation rate  Joint pain unfortunately has had intolerance has contraindications or otherwise lack of response on multiple DMARD treatment.  Specifically asked about Rinvoq I explained this is relatively contraindicated with stroke history.  I also believe her symptoms are primarily driven by the extensive osteoarthritis at this point but will recheck the sedimentation rate for monitoring.  Discussed possibility of resuming low-dose glucocorticoids if needed. Right ankle pain appears likely to be a sprain or strain injury although no clear mechanism of injury. I discussed 3 weeks is still within a normal expected timeframe for healing and she has an appointment with orthopedic surgery clinic on Friday to evaluate this.  Vitamin D deficiency - Plan: VITAMIN D 25 Hydroxy (Vit-D Deficiency, Fractures)  Checking vitamin D level in anticipation of switch to Reclast infusion treatment instead of Forteo.  Iron deficiency anemia, unspecified iron deficiency  anemia type - Plan: CBC with Differential/Platelet, Ferritin  Asked to check her ferritin and blood count again today gauging response to treatment she is very anxious to see this improve.  Looks like she has continued follow-up scheduled with hematology with plan to recheck that later this month but I think it is reasonable to add since we are getting lab test today regardless.  Age-related osteoporosis without current pathological fracture  Reviewed her DEXA scan looks like there was some improvement of lumbar spine bone density after Forteo treatment though it remains in osteoporosis range hips and osteopenia range without a large improvement.  I think she will benefit to continue treatment for bone density but would need to switch to antiresorptive as she has now been on Forteo for 2 years.  Recommended Reclast provided information and getting labs to review today we will reach out to her orthopedist as well regarding treatment plan.  Orders: Orders Placed This Encounter  Procedures   CBC with Differential/Platelet   Ferritin   Sedimentation rate   VITAMIN D 25 Hydroxy (Vit-D Deficiency, Fractures)   No orders of the defined types were placed in this encounter.    Follow-Up Instructions: Return in about 3 months (around 10/04/2023) for RA/OA/OP f/u 3mos.   Fuller Plan, MD  Note - This record has been created using [employer].  Chart creation errors have been sought, but may not always  have been located. Such creation errors do not reflect on  the standard of medical care.

## 2023-07-05 ENCOUNTER — Other Ambulatory Visit: Payer: Self-pay | Admitting: Pharmacist

## 2023-07-05 ENCOUNTER — Telehealth: Payer: Self-pay

## 2023-07-05 LAB — CBC WITH DIFFERENTIAL/PLATELET
Absolute Monocytes: 725 {cells}/uL (ref 200–950)
Basophils Absolute: 88 {cells}/uL (ref 0–200)
Basophils Relative: 0.7 %
Eosinophils Absolute: 100 {cells}/uL (ref 15–500)
Eosinophils Relative: 0.8 %
HCT: 31 % — ABNORMAL LOW (ref 35.0–45.0)
Hemoglobin: 9.9 g/dL — ABNORMAL LOW (ref 11.7–15.5)
Lymphs Abs: 2500 {cells}/uL (ref 850–3900)
MCH: 29.6 pg (ref 27.0–33.0)
MCHC: 31.9 g/dL — ABNORMAL LOW (ref 32.0–36.0)
MCV: 92.8 fL (ref 80.0–100.0)
MPV: 9.8 fL (ref 7.5–12.5)
Monocytes Relative: 5.8 %
Neutro Abs: 9088 {cells}/uL — ABNORMAL HIGH (ref 1500–7800)
Neutrophils Relative %: 72.7 %
Platelets: 504 10*3/uL — ABNORMAL HIGH (ref 140–400)
RBC: 3.34 10*6/uL — ABNORMAL LOW (ref 3.80–5.10)
RDW: 13.7 % (ref 11.0–15.0)
Total Lymphocyte: 20 %
WBC: 12.5 10*3/uL — ABNORMAL HIGH (ref 3.8–10.8)

## 2023-07-05 LAB — VITAMIN D 25 HYDROXY (VIT D DEFICIENCY, FRACTURES): Vit D, 25-Hydroxy: 80 ng/mL (ref 30–100)

## 2023-07-05 LAB — SEDIMENTATION RATE: Sed Rate: 82 mm/h — ABNORMAL HIGH (ref 0–30)

## 2023-07-05 LAB — FERRITIN: Ferritin: 337 ng/mL — ABNORMAL HIGH (ref 16–288)

## 2023-07-05 NOTE — Telephone Encounter (Signed)
Auth Submission: APPROVED Site of care: Site of care: [facility] Payer: [employer] medicare Medication & CPT/J Code(s) submitted: Reclast (Zolendronic acid) [id number] Route of submission (phone, fax, portal): portal Phone # Fax # Auth type: Buy/Bill PB Units/visits requested: 5mg  Reference number: [id number] Approval from: 07/05/23 to 10/02/23

## 2023-07-05 NOTE — Progress Notes (Signed)
Patient is new start to RECLAST.  Therapy plan placed for Reclast IV (O5366) for [facility] to start benefits investigation  Diagnosis: age-related osteoporosis  Provider: Dr. Sheliah Hatch  Dose: 5mg  IV every 12 months  Last Clinic Visit: 07/04/2023 Next Clinic Visit: 10/09/2023  Pertinent Labs: Vitamin D wnl on 07/04/2023, CMP stable on 06/14/23  Chesley Mires, PharmD, MPH, BCPS, CPP Clinical Pharmacist (Rheumatology and Pulmonology)

## 2023-07-06 ENCOUNTER — Encounter: Payer: Self-pay | Admitting: Surgical

## 2023-07-06 ENCOUNTER — Ambulatory Visit (INDEPENDENT_AMBULATORY_CARE_PROVIDER_SITE_OTHER): Payer: Medicare HMO | Admitting: Surgical

## 2023-07-06 ENCOUNTER — Other Ambulatory Visit: Payer: Self-pay

## 2023-07-06 VITALS — Wt <= 1120 oz

## 2023-07-06 DIAGNOSIS — M25571 Pain in right ankle and joints of right foot: Secondary | ICD-10-CM

## 2023-07-06 DIAGNOSIS — M7522 Bicipital tendinitis, left shoulder: Secondary | ICD-10-CM

## 2023-07-06 DIAGNOSIS — G8929 Other chronic pain: Secondary | ICD-10-CM | POA: Diagnosis not present

## 2023-07-06 DIAGNOSIS — M25512 Pain in left shoulder: Secondary | ICD-10-CM | POA: Diagnosis not present

## 2023-07-08 ENCOUNTER — Encounter: Payer: Self-pay | Admitting: Surgical

## 2023-07-08 MED ORDER — METHYLPREDNISOLONE ACETATE 40 MG/ML IJ SUSP
40.0000 mg | INTRAMUSCULAR | Status: AC | PRN
Start: 2023-07-06 — End: 2023-07-06
  Administered 2023-07-06: 40 mg via INTRA_ARTICULAR

## 2023-07-08 MED ORDER — BUPIVACAINE HCL 0.25 % IJ SOLN
9.0000 mL | INTRAMUSCULAR | Status: AC | PRN
Start: 2023-07-06 — End: 2023-07-06
  Administered 2023-07-06: 9 mL via INTRA_ARTICULAR

## 2023-07-08 MED ORDER — LIDOCAINE HCL 1 % IJ SOLN
5.0000 mL | INTRAMUSCULAR | Status: AC | PRN
Start: 2023-07-06 — End: 2023-07-06
  Administered 2023-07-06: 5 mL

## 2023-07-08 NOTE — Progress Notes (Signed)
Office Visit Note   Patient: Vanessa Lara           Date of Birth: [birth year]           MRN: [id number] Visit Date: 07/06/2023 Requested by: [name], NP [address] [place],  [place] [address] PCP: [name], NP  Subjective: Chief Complaint  Patient presents with   Right Knee - Pain   Left Knee - Pain    HPI: Vanessa Lara is a 67 y.o. female who presents to the office reporting right ankle and right knee pain.  Patient had previous injection in both these joints on 05/02/2023 that provided good relief though apparently this was short-lived.  She request knee injection today.  She states that her right ankle was doing well up until about 3 weeks ago.  She recently saw Dr. Sheliah Hatch who follows her for her rheumatoid arthritis.  She denies any history of injury with this right ankle.  She has pain diffusely throughout the right ankle but primarily on the medial aspect of the ankle that extends to the arch of the foot.  She has increased pain with ambulation.  No fevers or chills..                ROS: All systems reviewed are negative as they relate to the chief complaint within the history of present illness.  Patient denies fevers or chills.  Assessment & Plan: Visit Diagnoses:  1. Chronic left shoulder pain   2. Pain in right ankle and joints of right foot     Plan: Patient is a 67 year old female who presents for evaluation of right ankle and right knee pain.  Has history of early arthritis of the right knee with good relief for several months from knee injections both from cortisone and gel.  Too soon for another knee injection though this was her request today.    She also describes left shoulder pain that has been bothering her over the last couple weeks as she has had to use a cane more and more often in her left hand due to the right knee and right ankle pain.  Her exam is consistent with bicep tendinitis.  Bicep tendon is palpable and intact  and intact by ultrasound exam today.  After discussion of options, patient would like to try shoulder injection.  She has previously had subacromial injection with good relief of previous shoulder pain but with suggestion of bicep tendinitis, we will try glenohumeral injection today.  This was administered under ultrasound guidance and patient tolerated well.  Also complains of right ankle pain.  Previous right ankle injection on 05/02/2023 gave her good relief up until about 3 weeks ago.  With continued pain that is making ambulation difficult, plan to order MRI of the right ankle for further evaluation of ankle pain.   Follow-Up Instructions: No follow-ups on file.   Orders:  Orders Placed This Encounter  Procedures   US Guided Needle Placement - No Linked Charges   MR Ankle Right w/o contrast   No orders of the defined types were placed in this encounter.     Procedures: Large Joint Inj: L glenohumeral on 07/06/2023 4:30 PM Details: 22 G 1.5 in needle, ultrasound-guided posterior approach Medications: 5 mL lidocaine 1 %; 9 mL bupivacaine 0.25 %; 40 mg methylPREDNISolone acetate 40 MG/ML Outcome: tolerated well, no immediate complications Procedure, treatment alternatives, risks and benefits explained, specific risks discussed. Consent was given  by the patient. Patient was prepped and draped in the usual sterile fashion.       Clinical Data: No additional findings.  Objective: Vital Signs: Wt 68 lb (30.8 kg)   BMI 13.28 kg/m   Physical Exam:  Constitutional: Patient appears well-developed HEENT:  Head: Normocephalic Eyes:EOM are normal Neck: Normal range of motion Cardiovascular: Normal rate Pulmonary/chest: Effort normal Neurologic: Patient is alert Skin: Skin is warm Psychiatric: Patient has normal mood and affect  Ortho Exam: Ortho exam demonstrates right ankle with tenderness diffusely but worst over the ankle joint line and the navicular.  Palpable DP pulse.   Intact ankle dorsiflexion and plantarflexion as well as inversion/eversion.  Inversion does reproduce her pain.  She has tenderness throughout the course of the posterior tib tendon.  No calf tenderness.  Negative Homans' sign.  Right knee with diffuse tenderness again but no effusion.  She has no pain with passive motion of the right knee.  She is able to perform straight leg raise.  No pain with hip range of motion.  Left shoulder with well-preserved passive and active motion of the shoulder.  There is no abnormality that is visible.  No evidence of Popeye deformity.  She has intact supra, infra, subscap strength rated 5/5.  Intact supination and bicep flexion strength.  She does have tenderness over the bicipital groove.  No tenderness over the [facility] joint.  No cellulitis or skin changes surrounding the shoulder girdle region.  Specialty Comments:  MRI CERVICAL SPINE WITHOUT CONTRAST   TECHNIQUE: Multiplanar, multisequence MR imaging of the cervical spine was performed. No intravenous contrast was administered.   COMPARISON:  None.   FINDINGS: Alignment: 2 mm retrolisthesis of C5 on C6.   Vertebrae: No acute fracture, evidence of discitis, or bone lesion.   Cord: Normal signal and morphology.   Posterior Fossa, vertebral arteries, paraspinal tissues: Posterior fossa demonstrates no focal abnormality. Vertebral artery flow voids are maintained. Paraspinal soft tissues are unremarkable.   Disc levels:   Discs: Degenerative disease with disc height loss at C4-5 and C5-6.   C2-3: No significant disc bulge. No neural foraminal stenosis. No central canal stenosis.   C3-4: Mild broad-based disc bulge. No foraminal or central canal stenosis.   C4-5: Mild broad-based disc bulge. No foraminal or central canal stenosis.   C5-6: Mild broad-based disc bulge. Bilateral uncovertebral degenerative changes. Moderate left and severe right foraminal stenosis. No spinal stenosis.   C6-7: Mild  broad-based disc bulge. No foraminal or central canal stenosis.   C7-T1: No significant disc bulge. No neural foraminal stenosis. No central canal stenosis.   IMPRESSION: 1. At C5-6 there is a mild broad-based disc bulge. Bilateral uncovertebral degenerative changes. Moderate left and severe right foraminal stenosis. 2. Otherwise, mild cervical spine spondylosis as described above.     Electronically Signed   By: Elige Ko M.D.   On: 12/16/2021 10:51  Imaging: No results found.   PMFS History: Patient Active Problem List   Diagnosis Date Noted   High risk medication use 01/16/2023   Medication monitoring encounter 12/05/2021   Physical deconditioning 11/11/2021   Elevated serum lactate dehydrogenase (LDH) 08/17/2021   Vitamin D deficiency 08/17/2021   Chronic fatigue 07/25/2021   Vitamin B12 deficiency 07/19/2021   Hardening of the aorta (main artery of the heart) (HCC) 06/21/2021   Solitary lung nodule 06/15/2021   Loss of weight 05/18/2021   Protein-calorie malnutrition, severe (HCC) 05/18/2021   Osteoporosis 06/15/2020   Iron deficiency anemia 04/09/2020  Primary osteoarthritis involving multiple joints 12/31/2018   Rheumatoid arthritis involving multiple sites with positive rheumatoid factor (HCC) 12/31/2018   Irritable bowel syndrome with both constipation and diarrhea 10/28/2018   Constipation 03/14/2016   Mitral valve prolapse 11/23/2015   High cholesterol 11/23/2015   Past Medical History:  Diagnosis Date   Anemia    Anxiety    Arthritis    RA   Bursitis    Cataract    CHF (congestive heart failure) (HCC)    Collagen vascular disease (HCC)    Depression    Endometriosis    Heart murmur    High cholesterol    Hypertension    IBS (irritable bowel syndrome)    Lymphadenopathy    Mitral valve prolapse    Osteoporosis    RA (rheumatoid arthritis) (HCC)    Rotator cuff tear    bilateral   Stroke (HCC) 06/2022   Uterus, adenomyosis     Family  History  Problem Relation Age of Onset   Bone cancer Mother    Brain cancer Father    Brain cancer Sister    Breast cancer Sister    Renal Disease Sister    Pancreatic cancer Neg Hx    Esophageal cancer Neg Hx    Stomach cancer Neg Hx    Rectal cancer Neg Hx    Colon polyps Neg Hx     Past Surgical History:  Procedure Laterality Date   AXILLARY LYMPH NODE BIOPSY Right 10/12/2020   Procedure: RIGHT AXILLARY LYMPH NODE BIOPSY EXCISION;  Surgeon: Almond Lint, MD;  Location: [facility];  Service: General;  Laterality: Right;   BUNIONECTOMY Right    CARPAL TUNNEL RELEASE Right    COLONOSCOPY     COLONOSCOPY N/A 12/13/2015   Procedure: COLONOSCOPY;  Surgeon: Malissa Hippo, MD;  Location: [facility];  Service: Endoscopy;  Laterality: N/A;  9:55   Fatty tumor     2011 (left arm)   TONSILLECTOMY     TOTAL ABDOMINAL HYSTERECTOMY     2003   Social History   Occupational History   Occupation: retired  Tobacco Use   Smoking status: Some Days    Current packs/day: 0.00    Average packs/day: 0.5 packs/day for 33.0 years (16.5 ttl pk-yrs)    Types: Cigarettes    Start date: 34    Last attempt to quit: 2013    Years since quitting: 11.7   Smokeless tobacco: Never  Vaping Use   Vaping status: Never Used  Substance and Sexual Activity   Alcohol use: No    Alcohol/week: 0.0 standard drinks of alcohol   Drug use: No   Sexual activity: Not Currently

## 2023-07-09 ENCOUNTER — Ambulatory Visit (INDEPENDENT_AMBULATORY_CARE_PROVIDER_SITE_OTHER): Payer: Medicare HMO | Admitting: Orthopedic Surgery

## 2023-07-09 DIAGNOSIS — M0579 Rheumatoid arthritis with rheumatoid factor of multiple sites without organ or systems involvement: Secondary | ICD-10-CM

## 2023-07-09 MED ORDER — LIDOCAINE HCL 1 % IJ SOLN
1.0000 mL | INTRAMUSCULAR | Status: AC | PRN
Start: 2023-07-09 — End: 2023-07-09
  Administered 2023-07-09: 1 mL

## 2023-07-09 MED ORDER — BETAMETHASONE SOD PHOS & ACET 6 (3-3) MG/ML IJ SUSP
6.0000 mg | INTRAMUSCULAR | Status: AC | PRN
Start: 2023-07-09 — End: 2023-07-09
  Administered 2023-07-09: 6 mg via INTRA_ARTICULAR

## 2023-07-09 NOTE — Progress Notes (Signed)
Vanessa Lara - 67 y.o. female MRN [id number]  Date of birth: [birth year]  Office Visit Note: Visit Date: 07/09/2023 PCP: [name], NP Referred by: Julieanne Cotton, PA-C  Subjective: No chief complaint on file.  HPI: Vanessa Lara is a pleasant 67 y.o. female with known rheumatoid arthritis who presents today for evaluation of bilateral hand pain with associated deformities.  Pertinent ROS were reviewed with the patient and found to be negative unless otherwise specified above in HPI.   Visit Reason: left hand Duration of symptoms: years Hand dominance: right Occupation: unemployed Diabetic: No Smoking: No Heart/Lung History: none Blood Thinners:  baby aspirin  Prior Testing/EMG: 02/06/23 xrays, 04/08/23 MRI Injections (Date): 05/09/23 triggers Treatments: none Prior Surgery:none  Assessment & Plan: Visit Diagnoses: No diagnosis found.  Plan: Extensive discussion was had with the patient today regarding her bilateral wrist and hand deformities in the setting of rheumatoid arthritis.  She has severe caput ulna syndrome bilaterally with associated ulnar deviation at the MCP joints bilaterally.  There is also significant radiocarpal arthritic changes seen on her most recent left wrist x-rays.  We discussed her underlying rheumatoid arthritis and the possibility for DMARD medication to help temporize her symptoms.  She does follow with a rheumatologist.  I explained that she would require extensive surgery throughout the hands and wrists for full reconstruction, this would be in the setting of poor bone health and quality.  We discussed the possibility of wrist arthroplasty versus arthrodesis as well as associated MCP arthroplasty which would likely need to be done diffusely once the wrist is appropriately stabilized.  However, I did explain that this would be extensive surgery and will require significant amount of healing and postoperative recovery.  We discussed  conservative treatment options for the wrist and hands, in the form of activity modification, medications, bracing and injections.  She is interested in a cortisone injection to the left wrist radiocarpal joint which I am in agreement with.  This was provided today.  She is welcome to follow-up with me in the future as needed.  Follow-up: No follow-ups on file.   Meds & Orders: No orders of the defined types were placed in this encounter.  No orders of the defined types were placed in this encounter.    Procedures: Medium Joint Inj: L radiocarpal on 07/09/2023 2:23 PM Details: dorsal approach Medications: 1 mL lidocaine 1 %; 6 mg betamethasone acetate-betamethasone sodium phosphate 6 (3-3) MG/ML         Clinical History: MRI CERVICAL SPINE WITHOUT CONTRAST   TECHNIQUE: Multiplanar, multisequence MR imaging of the cervical spine was performed. No intravenous contrast was administered.   COMPARISON:  None.   FINDINGS: Alignment: 2 mm retrolisthesis of C5 on C6.   Vertebrae: No acute fracture, evidence of discitis, or bone lesion.   Cord: Normal signal and morphology.   Posterior Fossa, vertebral arteries, paraspinal tissues: Posterior fossa demonstrates no focal abnormality. Vertebral artery flow voids are maintained. Paraspinal soft tissues are unremarkable.   Disc levels:   Discs: Degenerative disease with disc height loss at C4-5 and C5-6.   C2-3: No significant disc bulge. No neural foraminal stenosis. No central canal stenosis.   C3-4: Mild broad-based disc bulge. No foraminal or central canal stenosis.   C4-5: Mild broad-based disc bulge. No foraminal or central canal stenosis.   C5-6: Mild broad-based disc bulge. Bilateral uncovertebral degenerative changes. Moderate left and severe right foraminal stenosis. No spinal stenosis.   C6-7: Mild  broad-based disc bulge. No foraminal or central canal stenosis.   C7-T1: No significant disc bulge. No neural  foraminal stenosis. No central canal stenosis.   IMPRESSION: 1. At C5-6 there is a mild broad-based disc bulge. Bilateral uncovertebral degenerative changes. Moderate left and severe right foraminal stenosis. 2. Otherwise, mild cervical spine spondylosis as described above.     Electronically Signed   By: Elige Ko M.D.   On: 12/16/2021 10:51  She reports that she has been smoking cigarettes. She started smoking about 44 years ago. She has a 16.5 pack-year smoking history. She has never used smokeless tobacco.  Recent Labs    11/02/22 1122  HGBA1C 5.1    Objective:   Vital Signs: There were no vitals taken for this visit.  Physical Exam  Gen: Well-appearing, in no acute distress; non-toxic CV: Regular Rate. Well-perfused. Warm.  Resp: Breathing unlabored on room air; no wheezing. Psych: Fluid speech in conversation; appropriate affect; normal thought process  Ortho Exam PHYSICAL EXAM:  General: Patient is well appearing and in no distress.  Bilateral compression gloves on the hands removed for examination today  Range of Motion and Palpation Tests: Significant deformity with ulnar deviation at the MCPs bilaterally with associated pain and instability with collateral stress testing Notable crepitus with wrist range of motion bilaterally Significantly prominent ulna dorsally bilaterally with associated instability with stress testing at the DRUJ  Neurologic, Vascular, Motor: Sensation is slightly diminished diffusely throughout the hand, particularly in median/ulnar nerve distributions bilaterally  Fingers pink and well perfused.  Capillary refill is brisk.      Lab Results  Component Value Date   HGBA1C 5.1 11/02/2022     Imaging: No results found.  Past Medical/Family/Surgical/Social History: Medications & Allergies reviewed per EMR, new medications updated. Patient Active Problem List   Diagnosis Date Noted   High risk medication use 01/16/2023    Medication monitoring encounter 12/05/2021   Physical deconditioning 11/11/2021   Elevated serum lactate dehydrogenase (LDH) 08/17/2021   Vitamin D deficiency 08/17/2021   Chronic fatigue 07/25/2021   Vitamin B12 deficiency 07/19/2021   Hardening of the aorta (main artery of the heart) (HCC) 06/21/2021   Solitary lung nodule 06/15/2021   Loss of weight 05/18/2021   Protein-calorie malnutrition, severe (HCC) 05/18/2021   Osteoporosis 06/15/2020   Iron deficiency anemia 04/09/2020   Primary osteoarthritis involving multiple joints 12/31/2018   Rheumatoid arthritis involving multiple sites with positive rheumatoid factor (HCC) 12/31/2018   Irritable bowel syndrome with both constipation and diarrhea 10/28/2018   Constipation 03/14/2016   Mitral valve prolapse 11/23/2015   High cholesterol 11/23/2015   Past Medical History:  Diagnosis Date   Anemia    Anxiety    Arthritis    RA   Bursitis    Cataract    CHF (congestive heart failure) (HCC)    Collagen vascular disease (HCC)    Depression    Endometriosis    Heart murmur    High cholesterol    Hypertension    IBS (irritable bowel syndrome)    Lymphadenopathy    Mitral valve prolapse    Osteoporosis    RA (rheumatoid arthritis) (HCC)    Rotator cuff tear    bilateral   Stroke (HCC) 06/2022   Uterus, adenomyosis    Family History  Problem Relation Age of Onset   Bone cancer Mother    Brain cancer Father    Brain cancer Sister    Breast cancer Sister    Renal  Disease Sister    Pancreatic cancer Neg Hx    Esophageal cancer Neg Hx    Stomach cancer Neg Hx    Rectal cancer Neg Hx    Colon polyps Neg Hx    Past Surgical History:  Procedure Laterality Date   AXILLARY LYMPH NODE BIOPSY Right 10/12/2020   Procedure: RIGHT AXILLARY LYMPH NODE BIOPSY EXCISION;  Surgeon: Almond Lint, MD;  Location: [facility];  Service: General;  Laterality: Right;   BUNIONECTOMY Right    CARPAL TUNNEL RELEASE Right     COLONOSCOPY     COLONOSCOPY N/A 12/13/2015   Procedure: COLONOSCOPY;  Surgeon: Malissa Hippo, MD;  Location: [facility];  Service: Endoscopy;  Laterality: N/A;  9:55   Fatty tumor     2011 (left arm)   TONSILLECTOMY     TOTAL ABDOMINAL HYSTERECTOMY     2003   Social History   Occupational History   Occupation: retired  Tobacco Use   Smoking status: Some Days    Current packs/day: 0.00    Average packs/day: 0.5 packs/day for 33.0 years (16.5 ttl pk-yrs)    Types: Cigarettes    Start date: 53    Last attempt to quit: 2013    Years since quitting: 11.7   Smokeless tobacco: Never  Vaping Use   Vaping status: Never Used  Substance and Sexual Activity   Alcohol use: No    Alcohol/week: 0.0 standard drinks of alcohol   Drug use: No   Sexual activity: Not Currently    Mads Borgmeyer Fara Boros) Denese Killings, M.D. [facility] 2:14 PM

## 2023-07-09 NOTE — Progress Notes (Signed)
Reclast scheduled for 07/18/23

## 2023-07-10 ENCOUNTER — Telehealth: Payer: Self-pay | Admitting: Orthopedic Surgery

## 2023-07-10 ENCOUNTER — Ambulatory Visit: Payer: Medicare HMO | Admitting: Orthopedic Surgery

## 2023-07-10 NOTE — Telephone Encounter (Signed)
Pt called Vanessa Lara. Go thru her chart to see when she had injections for cortisone and gel. Pt states she is in severe pains and she think she is due for some type of injection and states in her mind she need to know what dates she had her injections. She states she don't mean to be a pain but she is in pain. She is asking for a call back from Vanessa Lara when she has time to go thru pt chart. Please call pt at [phone].

## 2023-07-11 NOTE — Telephone Encounter (Signed)
Per Vanessa Lara scheduled appt 10/31 for extended amount of time due to complexity of patients medical condition.

## 2023-07-11 NOTE — Telephone Encounter (Signed)
Yeah end of October is soonest which is what I told her the other day.  I also offered her a toradol injection or setting her up with HHPT which she declined

## 2023-07-11 NOTE — Telephone Encounter (Signed)
IC, no answer. Unable to LM, VM is full Patient had bilateral knee cortisone injections on 04/18/23 and right knee gel on 05/02/23.  Patient insurance will not allow right knee gel until 6 months from 07/31.  How soon would you be willing to repeat bilat knee cortisone? End of October?

## 2023-07-12 ENCOUNTER — Other Ambulatory Visit (HOSPITAL_COMMUNITY): Payer: Self-pay

## 2023-07-13 ENCOUNTER — Telehealth: Payer: Self-pay | Admitting: Orthopedic Surgery

## 2023-07-13 NOTE — Telephone Encounter (Signed)
Patient called and wants to see if she can get the boot for the right foot today. [id number]#[phone]

## 2023-07-14 ENCOUNTER — Ambulatory Visit (HOSPITAL_COMMUNITY)
Admission: RE | Admit: 2023-07-14 | Discharge: 2023-07-14 | Disposition: A | Discharge status: Home / Self Care | Payer: Medicare HMO | Source: Ambulatory Visit | Attending: Surgical | Admitting: Surgical

## 2023-07-14 DIAGNOSIS — M25571 Pain in right ankle and joints of right foot: Secondary | ICD-10-CM | POA: Insufficient documentation

## 2023-07-17 ENCOUNTER — Other Ambulatory Visit: Payer: Self-pay

## 2023-07-17 ENCOUNTER — Other Ambulatory Visit (HOSPITAL_COMMUNITY): Payer: Self-pay

## 2023-07-17 DIAGNOSIS — D500 Iron deficiency anemia secondary to blood loss (chronic): Secondary | ICD-10-CM

## 2023-07-17 NOTE — Progress Notes (Signed)
Lab orders entered

## 2023-07-18 ENCOUNTER — Inpatient Hospital Stay: Payer: Medicare HMO | Attending: Hematology

## 2023-07-18 ENCOUNTER — Encounter: Payer: Medicare HMO | Attending: Internal Medicine | Admitting: Emergency Medicine

## 2023-07-18 VITALS — BP 115/55 | HR 106 | Temp 97.9°F | Resp 18

## 2023-07-18 DIAGNOSIS — D72829 Elevated white blood cell count, unspecified: Secondary | ICD-10-CM | POA: Diagnosis not present

## 2023-07-18 DIAGNOSIS — D500 Iron deficiency anemia secondary to blood loss (chronic): Secondary | ICD-10-CM

## 2023-07-18 DIAGNOSIS — E46 Unspecified protein-calorie malnutrition: Secondary | ICD-10-CM | POA: Diagnosis not present

## 2023-07-18 DIAGNOSIS — R591 Generalized enlarged lymph nodes: Secondary | ICD-10-CM | POA: Diagnosis not present

## 2023-07-18 DIAGNOSIS — M810 Age-related osteoporosis without current pathological fracture: Secondary | ICD-10-CM

## 2023-07-18 DIAGNOSIS — D75839 Thrombocytosis, unspecified: Secondary | ICD-10-CM | POA: Diagnosis not present

## 2023-07-18 DIAGNOSIS — M069 Rheumatoid arthritis, unspecified: Secondary | ICD-10-CM | POA: Insufficient documentation

## 2023-07-18 DIAGNOSIS — D509 Iron deficiency anemia, unspecified: Secondary | ICD-10-CM | POA: Diagnosis present

## 2023-07-18 LAB — CBC WITH DIFFERENTIAL/PLATELET
Abs Immature Granulocytes: 0.09 10*3/uL — ABNORMAL HIGH (ref 0.00–0.07)
Basophils Absolute: 0.1 10*3/uL (ref 0.0–0.1)
Basophils Relative: 0 %
Eosinophils Absolute: 0.1 10*3/uL (ref 0.0–0.5)
Eosinophils Relative: 1 %
HCT: 25.6 % — ABNORMAL LOW (ref 36.0–46.0)
Hemoglobin: 8.1 g/dL — ABNORMAL LOW (ref 12.0–15.0)
Immature Granulocytes: 1 %
Lymphocytes Relative: 15 %
Lymphs Abs: 2.2 10*3/uL (ref 0.7–4.0)
MCH: 30.1 pg (ref 26.0–34.0)
MCHC: 31.6 g/dL (ref 30.0–36.0)
MCV: 95.2 fL (ref 80.0–100.0)
Monocytes Absolute: 0.8 10*3/uL (ref 0.1–1.0)
Monocytes Relative: 5 %
Neutro Abs: 12 10*3/uL — ABNORMAL HIGH (ref 1.7–7.7)
Neutrophils Relative %: 78 %
Platelets: 446 10*3/uL — ABNORMAL HIGH (ref 150–400)
RBC: 2.69 MIL/uL — ABNORMAL LOW (ref 3.87–5.11)
RDW: 15.4 % (ref 11.5–15.5)
WBC: 15.3 10*3/uL — ABNORMAL HIGH (ref 4.0–10.5)
nRBC: 0 % (ref 0.0–0.2)

## 2023-07-18 LAB — IRON AND TIBC
Iron: 25 ug/dL — ABNORMAL LOW (ref 28–170)
Saturation Ratios: 11 % (ref 10.4–31.8)
TIBC: 238 ug/dL — ABNORMAL LOW (ref 250–450)
UIBC: 213 ug/dL

## 2023-07-18 LAB — FERRITIN: Ferritin: 165 ng/mL (ref 11–307)

## 2023-07-18 MED ORDER — ZOLEDRONIC ACID 5 MG/100ML IV SOLN
5.0000 mg | Freq: Once | INTRAVENOUS | Status: AC
  Administered 2023-07-18: 5 mg via INTRAVENOUS

## 2023-07-18 NOTE — Telephone Encounter (Signed)
Called and scheduled patient for tomorrow. She will come for a nurse visit at 2pm.

## 2023-07-18 NOTE — Progress Notes (Signed)
Diagnosis: Osteoporosis  Provider:   Sheliah Hatch   Procedure: IV Infusion  IV Type: Peripheral, IV Location: L Antecubital   Reclast (Zolendronic Acid), Dose: 5 mg  Infusion Start Time: 1144  Infusion Stop Time: 1217  Post Infusion IV Care: Observation period completed and Peripheral IV Discontinued  Discharge: Condition: Good, Destination: Home . AVS Provided  Performed by:  Arrie Senate, RN

## 2023-07-18 NOTE — Addendum Note (Signed)
Addended by: Murrell Redden on: 07/18/2023 03:48 PM   Modules accepted: Orders

## 2023-07-19 ENCOUNTER — Ambulatory Visit: Payer: Medicare HMO

## 2023-07-20 ENCOUNTER — Other Ambulatory Visit (HOSPITAL_COMMUNITY): Payer: Self-pay

## 2023-07-23 ENCOUNTER — Telehealth: Payer: Self-pay

## 2023-07-23 NOTE — Telephone Encounter (Signed)
Patient called with several questions.   ---Patient asked when she was going to feel the "good stuff" from her injection last week. Upon chart review, patient received reclast IV on 07/18/2023 at [facility]. I advised patient that this infusion is for her osteoporosis, not the rheumatoid arthritis.   ---Patient states she has been experiencing flu-like symptoms since 07/19/2023 and would like to know if this is a side effect of the reclast IV or if her labs show something like anemia that could be contributing to her feeling like this like?   ---Patient would like to know lab results from 07/18/2023.  ---Patient states she also needs something to take for the rheumatoid arthritis. Patient is not taking the plaquenil and upon reading the last office note from 07/04/2023 and advising the patient why she discontinued the plaquenil, "she stopped hydroxychloroquine after last visit due to concern about cardiac arrhythmia alongside Paxil and duloxetine". Patient then looked through all of her medicine bottles and states she is only taking the paxil now and is asking if she could potentially go back on the hydroxychloroquine?   Please advise. Thanks!

## 2023-07-24 ENCOUNTER — Other Ambulatory Visit: Payer: Self-pay

## 2023-07-24 NOTE — Progress Notes (Unsigned)
VIRTUAL VISIT via TELEPHONE NOTE [facility]   I connected with Vanessa Lara  on 07/25/23 at  10:19 AM by telephone and verified that I am speaking with the correct person using two identifiers.  Location: Patient: Home Provider: [facility]   I discussed the limitations, risks, security and privacy concerns of performing an evaluation and management service by telephone and the availability of in person appointments. I also discussed with the patient that there may be a patient responsible charge related to this service. The patient expressed understanding and agreed to proceed.  REASON FOR VISIT:  Follow-up for iron deficiency anemia and lymphadenopathy  CURRENT THERAPY: Intermittent Feraheme  INTERVAL HISTORY:   Vanessa Lara 67 y.o. female returns for routine follow-up of iron deficiency anemia and lymphadenopathy.  She was last evaluated via telemedicine visit by Rojelio Brenner PA-C on 04/17/2023.    At today's visit, she reports feeling poorly.  She continues to suffer from chronic pain, malnutrition, weight loss, severe fatigue, and general malaise.  She is seeing multiple specialists for workup and management of these chronic issues.    Patient called in August 2024 to report worsening fatigue, and was given IV Venofer 200 mg x3 doses in September 2024.   She felt no improvement after IV Venofer in September 2024.  She continues to deny any obvious rectal bleeding, melena, epistaxis, or other source of blood loss.  She denies any new lumps or bumps.  She reports that she feels cold all the time.  She denies any other B symptoms such as fever  or night sweats.  She remains severely fatigued with dyspnea on exertion and palpitations.  No chest pain.  She has occasional dizziness and headaches.  She recently received steroid injection via orthopedist on 07/06/2023.  She has little to no energy and 50% appetite.  Her weight remains low but overall stable for  the past year.  REVIEW OF SYSTEMS:   Review of Systems  Constitutional:  Negative for chills, diaphoresis and fever.  HENT:  Negative for nosebleeds.   Respiratory:  Positive for shortness of breath (with exertion). Negative for cough and hemoptysis.   Cardiovascular:  Positive for palpitations. Negative for chest pain.  Gastrointestinal:  Positive for constipation, diarrhea and nausea. Negative for abdominal pain, blood in stool and vomiting.  Genitourinary:  Negative for hematuria.  Musculoskeletal:  Positive for joint pain and myalgias.  Skin: Negative.   Neurological:  Positive for dizziness, tingling and headaches.  Endo/Heme/Allergies:  Does not bruise/bleed easily.  Psychiatric/Behavioral:  Positive for depression. Negative for suicidal ideas. The patient is nervous/anxious.      PHYSICAL EXAM: (per limitations of virtual telephone visit)  The patient is alert and oriented x 3, exhibiting adequate mentation, good mood, and ability to speak in full sentences and execute sound judgement.  ASSESSMENT & PLAN:  1.   Normocytic anemia: Anemia of chronic disease +/- iron deficiency - History of significant iron deficiency with mild anemia (Hgb ranging from 11.0-12.0) - Most recent EGD (06/18/2023): Normal esophagus.  Diffuse mildly congestive gastric mucosa.  Normal mucosa in duodenum.  Biopsies taken for evaluation of celiac disease were NEGATIVE. - Most recent colonoscopy (06/18/2023): Decreased sphincter tone.  Otherwise normal colon. - Capsule endoscopy (02/08/2021): Complete capsule endoscopy with no findings to explain patient's symptoms - CT enterography abdomen/pelvis (01/04/2021): No abnormal findings to explain chronic iron deficiency - We reviewed her prior anemia work-up which showed normal B12, folic acid and  methylmalonic acid levels.  Copper levels were normal.  SPEP was negative. - Most recent IV iron with Venofer 200 mg x 3 in September 2024 - No bright red blood per rectum  or melena.  She was Hemoccult stool POSITIVE x 3. - She has severe chronic fatigue, which is multifactorial - Most recent labs (07/18/2023): Hgb 8.1/MCV 95.2, WBC 15.3, platelets 446 (previous MPN testing was negative) Ferritin 165, iron saturation 11% with low TIBC 238 and low serum iron 25 Elevated ESR at 82 (07/04/2023) (Previous labs from May 2024 showed normal B12 628, normal MMA, normal folate, normal copper, normal vitamin D, creatinine) - Differential diagnosis favors anemia of chronic disease with functional iron deficiency, occult GI blood loss, and likely malabsorption secondary to chronic inflammation. -- She started Plaquenil in June 2024 for her rheumatoid arthritis - PLAN: Unclear etiology of drop in hemoglobin, apart from her functional iron deficiency and chronic disease.   - Recommend weekly Venofer 200 mg x 5 - Check weekly CBC/D and BB sample - Labs and RTC in 6 weeks - if no improvement in anemia despite IV iron, we will proceed with additional workup, and consider bone marrow biopsy if no other cause of anemia is apparent  2.  Leukocytosis & thrombocytosis - Past CBCs reveal intermittent leukocytosis (neutrophil predominant) and thrombocytosis  - MPN work-up was negative for JAK2 V617F, JAK2 exon 12, MPL and CALR.  Reflex testing to NGS did not reveal any abnormal mutations. - BCR/ABL FISH was negative - Inflammatory markers have been elevated, most recently with ESR 82 in October 2024 - Findings are consistent with chronic inflammation and reactive leukocytosis/thrombocytosis - No indication for bone marrow biopsy or further work-up at this time - Most recent CBC (07/18/2023) with WBC 15.3/ANC 12.0 and platelets 446 (NOTE: Received steroid injection via orthopedist on 07/06/2023) - PLAN: Most likely represents reactive leukocytosis/thrombocytosis.  We will continue active surveillance and consider additional workup if significant deviation from baseline.  3.  Weight loss  and malnutrition - Not addressed during telephone visit (see last office visit from 04/17/2023) - PLAN: Weight loss and malnutrition is being managed by her PCP.  4.  PET positive right axillary lymph node - PET scan obtained 07/09/2020 due to unintentional weight loss showed mildly hypermetabolic right axillary lymph node - Right axillary lymph node was palpable on exam (November 2021) - She has completed 5 days of azithromycin as she had a history of cat bite. - She was advised by Dr. Leonides Schanz that her lymph node was likely reactive and related to her underlying inflammatory disease, but patient insisted on biopsy. - Patient opted for right axillary lymph node biopsy (10/12/2020), which showed benign reactive lymph node, no malignancy   5.  PET positive ileocecal lesion - PET scan obtained 07/09/2020 for unintentional weight loss showed accentuated activity along the distalmost terminal ileum and ileocecal valve, possibly physiologic since there was no mass or specific abnormality in this region on diagnostic CT as of 06/23/2020. - Patient had colonoscopy on 08/12/2020, in which the cecum and IC valvle appeared normal. Terminal ileum could not be intubated due to restricted sigmoid and redundant colon. - CT abdomen/pelvis with contrast (01/04/2021): No abnormal bowel wall thickening or abnormal mural enhancement in the stomach, small bowel, or colon.  No findings to suggest inflammatory or chronic fibrous small bowel stricture.  No mesenteric or intraperitoneal free fluid.  Terminal ileum was normal, no edema or inflammation in the region of the cecum.   6.  Osteoporosis: - DEXA scan on 04/13/2020 shows T score -4.2. - She follows with endocrinology and orthopedics   7.  Rheumatoid arthritis - She follows with Dr. Dierdre Forth / Azucena Fallen of [facility]  PLAN SUMMARY:  >> Weekly CBC + BB sample >> Venofer 200 mg once a week x 5 weeks >> Full lab panel in 5 weeks (labs TBD based on CBC  trend) >> Same-day labs (CBC/BB sample only) + OFFICE visit in 6 weeks    I discussed the assessment and treatment plan with the patient. The patient was provided an opportunity to ask questions and all were answered. The patient agreed with the plan and demonstrated an understanding of the instructions.   The patient was advised to call back or seek an in-person evaluation if the symptoms worsen or if the condition fails to improve as anticipated.  I provided 35 minutes of non-face-to-face time during this encounter.  Carnella Guadalajara, PA-C 07/25/23 10:48 AM

## 2023-07-25 ENCOUNTER — Inpatient Hospital Stay: Payer: Medicare HMO | Admitting: Physician Assistant

## 2023-07-25 ENCOUNTER — Inpatient Hospital Stay (HOSPITAL_BASED_OUTPATIENT_CLINIC_OR_DEPARTMENT_OTHER): Payer: Medicare HMO | Admitting: Physician Assistant

## 2023-07-25 DIAGNOSIS — D500 Iron deficiency anemia secondary to blood loss (chronic): Secondary | ICD-10-CM

## 2023-07-26 ENCOUNTER — Telehealth: Payer: Self-pay | Admitting: Surgical

## 2023-07-26 ENCOUNTER — Telehealth: Payer: Self-pay | Admitting: Internal Medicine

## 2023-07-26 ENCOUNTER — Other Ambulatory Visit: Payer: Self-pay | Admitting: Surgical

## 2023-07-26 MED ORDER — ACETAMINOPHEN-CODEINE 300-30 MG PO TABS: 1.0000 | ORAL_TABLET | Freq: Every day | ORAL | 0 refills | Status: DC | PRN

## 2023-07-26 NOTE — Telephone Encounter (Signed)
Sent in RX for Tylenol #3 which she has taken in the past

## 2023-07-26 NOTE — Telephone Encounter (Signed)
Tried calling pt. No answer and no voice mail 

## 2023-07-26 NOTE — Telephone Encounter (Signed)
Reclast was probably contributing to the flulike symptoms if these lasted for several days.  If associated with the medicine it should completely resolve on its own.  This would not be expected to occur again after second or later doses of the same medicine.  I addressed the lab results in associated result note today.  I am completely fine with her resuming the hydroxychloroquine since the low-dose she is taking would not be likely to have a major drug interaction.  Due to her low body weight the recommended dosing would be just taking 1 pill 5 days/week.

## 2023-07-26 NOTE — Telephone Encounter (Signed)
If her pain is that bad then she could consider going to ER or urgent care for eval. If she can get a ride, then maybe she can see Jean Rosenthal today? I haven't seen her for her right shoulder recently, just her left

## 2023-07-26 NOTE — Progress Notes (Signed)
Sedimentation rate of 82 the case a high level of inflammation this is somewhat worse than last time.  Possibly worse due to stopping the hydroxychloroquine.  Ferritin is mildly elevated at 337 this is probably due to continued inflammation but her iron level is probably fine.  Hemoglobin is 9.9 still with mild anemia this is slightly worse than her last time.  I think this is mostly related to her chronic inflammation and her poor calorie intake iron does not seem deficient.  Vitamin D is 80 this is quite good she does not need to take any more supplement than she is currently.

## 2023-07-26 NOTE — Telephone Encounter (Signed)
I called and advised pt. She wants to get in to see luke sooner. Please advise

## 2023-07-26 NOTE — Telephone Encounter (Signed)
Patient called said she hurt her right shoulder and need something for pain. The pain is coming down toward the front. [id number]#[phone]

## 2023-07-26 NOTE — Telephone Encounter (Signed)
Pt states she is in severe pain in right shoulder. She states she is unable to come in because she can't drive and need medical advice. She is asking for an ASAP response. Please call pt at [phone].

## 2023-07-26 NOTE — Telephone Encounter (Signed)
Pt called asking when Dr. Dimple Casey will be in touch with her about the questions she has . Pt called on Monday October 21st.

## 2023-07-27 ENCOUNTER — Encounter: Payer: Self-pay | Admitting: Internal Medicine

## 2023-07-27 ENCOUNTER — Encounter: Payer: Self-pay | Admitting: Hematology

## 2023-07-31 NOTE — Telephone Encounter (Signed)
Patient is wanting to know what she can take for pain. Patient wants to know when her medication should start working. Patient is having left rib pain. I explained to patient we do not prescribe pain medication.

## 2023-08-01 ENCOUNTER — Other Ambulatory Visit: Payer: Self-pay | Admitting: *Deleted

## 2023-08-01 ENCOUNTER — Other Ambulatory Visit: Payer: Self-pay

## 2023-08-01 ENCOUNTER — Telehealth: Payer: Self-pay | Admitting: *Deleted

## 2023-08-01 ENCOUNTER — Encounter: Payer: Self-pay | Admitting: Physician Assistant

## 2023-08-01 ENCOUNTER — Other Ambulatory Visit: Payer: Self-pay | Admitting: Physician Assistant

## 2023-08-01 ENCOUNTER — Telehealth: Payer: Self-pay

## 2023-08-01 DIAGNOSIS — K2950 Unspecified chronic gastritis without bleeding: Secondary | ICD-10-CM | POA: Insufficient documentation

## 2023-08-01 DIAGNOSIS — Q438 Other specified congenital malformations of intestine: Secondary | ICD-10-CM | POA: Insufficient documentation

## 2023-08-01 DIAGNOSIS — E782 Mixed hyperlipidemia: Secondary | ICD-10-CM | POA: Insufficient documentation

## 2023-08-01 DIAGNOSIS — D649 Anemia, unspecified: Secondary | ICD-10-CM

## 2023-08-01 DIAGNOSIS — F411 Generalized anxiety disorder: Secondary | ICD-10-CM | POA: Insufficient documentation

## 2023-08-01 NOTE — Progress Notes (Signed)
Our office was contacted by patient's PCP, as patient was at PCP appointment with hypotension (80s/50s) and tachycardia (heart rate around 110), complaining of severe fatigue.  Unfortunately, patient had missed her last 2 CBC/BB sample checks at our office, and had canceled her iron infusions.  I strongly suspect that her blood count has continued to drop.  Given her borderline vital signs, I suggested that she be seen in the emergency department, which PCP had also suggested.  We will continue to follow closely with patient, and encouraged her to not miss her lab or iron infusion appointments.  Our office will work on coordinating with her to overcome barriers to these appointments.  If she continues to have severe anemia that does not improve with IV iron, I would recommend reaching out to rheumatology to discontinue hydroxychloroquine, as that could be causing some anemia.  If she did not improve after holding rheumatologic medications, would strongly consider bone marrow biopsy.  We will also check hemolysis labs with next lab draw, to include CBC/D, BB sample, reticulocytes, fractionated bilirubin, DAT/Coombs, haptoglobin, LDH.  Carnella Guadalajara, PA-C 08/01/23 5:00 PM

## 2023-08-01 NOTE — Telephone Encounter (Signed)
Auth Submission: NO AUTH NEEDED Site of care: Site of care: [facility] Payer: [employer] Medication & CPT/J Code(s) submitted: Venofer (Iron Sucrose) J1756 Route of submission (phone, fax, portal): portal Phone # Fax # Auth type: Buy/Bill PB Units/visits requested: 200mg , 5 doses Reference number:  Approval from: 08/01/23 to 10/02/23

## 2023-08-01 NOTE — Telephone Encounter (Signed)
Patient called to request that her iron infusions be done at the Outpatient clinic in [place] due to accessibility issues.  Consulted with Rojelio Brenner, PAC and she is in agreement with her doing so, as long as she continues to follow up in clinic for office visits.  Arrangements made and patient is aware.

## 2023-08-02 ENCOUNTER — Other Ambulatory Visit: Payer: Self-pay

## 2023-08-02 ENCOUNTER — Ambulatory Visit (INDEPENDENT_AMBULATORY_CARE_PROVIDER_SITE_OTHER): Payer: Medicare HMO | Admitting: Surgical

## 2023-08-02 DIAGNOSIS — M1711 Unilateral primary osteoarthritis, right knee: Secondary | ICD-10-CM | POA: Diagnosis not present

## 2023-08-02 DIAGNOSIS — M8580 Other specified disorders of bone density and structure, unspecified site: Secondary | ICD-10-CM

## 2023-08-02 DIAGNOSIS — M1712 Unilateral primary osteoarthritis, left knee: Secondary | ICD-10-CM

## 2023-08-02 DIAGNOSIS — M19071 Primary osteoarthritis, right ankle and foot: Secondary | ICD-10-CM | POA: Diagnosis not present

## 2023-08-02 NOTE — Progress Notes (Signed)
Office Visit Note   Patient: Vanessa Lara           Date of Birth: [birth year]           MRN: [id number] Visit Date: 08/02/2023 Requested by: [name], NP [address] [place],  [place] [address] PCP: [name], NP  Subjective: Chief Complaint  Patient presents with   Other    Request bilateral knee injections  Review ankle MRI    HPI: Vanessa Lara is a 67 y.o. female who presents to the office for MRI review. Patient denies any changes in symptoms.  Continues to complain mainly of primarily medial midfoot pain and medial ankle pain no history of injury.  The posterior tib tendon insufficiency brace that she got has not provided any significant relief and feels more uncomfortable for her.  Also here for bilateral knee injections today.  Currently takes Reclast for her osteoporosis and her first infusion was last week.  MRI results revealed: MR Ankle Right w/o contrast  Result Date: 08/02/2023 CLINICAL DATA:  Right ankle pain.  History of rheumatoid arthritis EXAM: MRI OF THE RIGHT ANKLE WITHOUT CONTRAST TECHNIQUE: Multiplanar, multisequence MR imaging of the ankle was performed. No intravenous contrast was administered. COMPARISON:  X-ray 05/02/2023 FINDINGS: TENDONS Peroneal: Peroneus longus and brevis tendons are intact and normally positioned. Posteromedial: Tibialis posterior, flexor hallucis longus, and flexor digitorum longus tendons are intact and normally positioned. Anterior: Tibialis anterior, extensor hallucis longus, and extensor digitorum longus tendons are intact and normally positioned. Achilles: Intact. Plantar Fascia: Intact. LIGAMENTS Lateral: Intact tibiofibular ligaments. The anterior and posterior talofibular ligaments are intact. Intact calcaneofibular ligament. Medial: The deltoid and visualized portions of the spring ligament appear intact. Lisfranc: Intact. CARTILAGE AND BONES Ankle Joint: No significant ankle joint effusion. The talar  dome and tibial plafond are intact. Subtalar Joints/Sinus Tarsi: Chondral thinning of the middle subtalar joint. No effusion. Soft tissue edema with loss of the anatomic fat within the sinus tarsi. Bones: Intense bone marrow edema throughout the bones of the midfoot, most severely involving the navicular and cuneiform bones. There is also patchy bone marrow edema within the head of the talus, anterior calcaneus, cuboid, and second through fourth metatarsal bases. No well-defined fracture line. No significant joint effusion. Mild joint space narrowing throughout the midfoot. No discrete erosion. No malalignment. Other: Mild subcutaneous edema within the midfoot. No fluid collections. IMPRESSION: 1. Intense bone marrow edema throughout the bones of the midfoot, most severely involving the navicular and cuneiform bones. There is also patchy bone marrow edema within the head of the talus, anterior calcaneus, cuboid, and second through fourth metatarsal bases. No fracture, erosion, or significant joint effusion. Findings are nonspecific and may be stress-related or secondary to patient's history of rheumatoid arthritis. Transient bone marrow edema syndrome of the foot and ankle could also result in this appearance. 2. Soft tissue edema with loss of the anatomic fat within the sinus tarsi. Correlate for sinus tarsi syndrome. 3. Mild subtalar and midfoot osteoarthritis. Electronically Signed   By: Duanne Guess D.O.   On: 08/02/2023 08:50                 ROS: All systems reviewed are negative as they relate to the chief complaint within the history of present illness.  Patient denies fevers or chills.  Assessment & Plan: Visit Diagnoses:  1. Arthritis of right ankle   2. Bone loss     Plan: Vanessa Hurl  Lara is a 67 y.o. female who presents to the office for review of right ankle MRI.  MRI demonstrates nonspecific marrow edema.  Brace from her last visit has not provided much relief.  We discussed trying a  custom orthotic as a potential measure to help with her foot pain but she would like to avoid this and try an ankle brace.  This was provided for her today.  She also requests a right ankle injection which previously had given her good relief of her ankle pain with last injection about 4 months ago.  This is administered and patient Toller procedure well.  Bilateral knee cortisone injections administered today as well and referrals placed for patient to follow-up with Dr. Lajoyce Corners regarding her foot pain to see if there is any other conservative measures she can think of that may help her.  Also placed referral to [facility] and persons PA-C for management of her osteoporosis.  Follow-Up Instructions: No follow-ups on file.   Orders:  Orders Placed This Encounter  Procedures   Ambulatory referral to Orthopedic Surgery   Ambulatory referral to Orthopedic Surgery   No orders of the defined types were placed in this encounter.     Procedures: Large Joint Inj: bilateral knee on 08/02/2023 8:00 AM Indications: diagnostic evaluation, joint swelling and pain Details: 18 G 1.5 in needle, superolateral approach  Arthrogram: No  Medications (Right): 5 mL lidocaine 1 %; 4 mL bupivacaine 0.25 %; 40 mg methylPREDNISolone acetate 40 MG/ML Medications (Left): 5 mL lidocaine 1 %; 4 mL bupivacaine 0.25 %; 40 mg methylPREDNISolone acetate 40 MG/ML Outcome: tolerated well, no immediate complications Procedure, treatment alternatives, risks and benefits explained, specific risks discussed. Consent was given by the patient. Immediately prior to procedure a time out was called to verify the correct patient, procedure, equipment, support staff and site/side marked as required. Patient was prepped and draped in the usual sterile fashion.    Medium Joint Inj on 08/02/2023 8:00 AM Indications: pain, joint swelling and diagnostic evaluation Details: 22 G 1.5 in needle, fluoroscopy-guided anteromedial approach Medications:  3 mL lidocaine 1 %; 1 mL bupivacaine 0.5 %; 40 mg methylPREDNISolone acetate 40 MG/ML (1/2 mL bupivacaine 0.5 %) Outcome: tolerated well, no immediate complications Procedure, treatment alternatives, risks and benefits explained, specific risks discussed. Consent was given by the patient. Immediately prior to procedure a time out was called to verify the correct patient, procedure, equipment, support staff and site/side marked as required. Patient was prepped and draped in the usual sterile fashion.       Clinical Data: No additional findings.  Objective: Vital Signs: There were no vitals taken for this visit.  Physical Exam:  Constitutional: Patient appears well-developed HEENT:  Head: Normocephalic Eyes:EOM are normal Neck: Normal range of motion Cardiovascular: Normal rate Pulmonary/chest: Effort normal Neurologic: Patient is alert Skin: Skin is warm Psychiatric: Patient has normal mood and affect  Ortho Exam: Ortho exam demonstrates right ankle with tenderness to palpation of the ankle joint line.  She has intact ankle dorsiflexion and plantarflexion as well as inversion/eversion without weakness.  There is no cellulitis or skin changes noted.  Does have some mild to moderate tenderness diffusely throughout the midfoot joints.  Primarily on the medial side.  Right knees with no effusion.  No cellulitis or skin changes noted around the right knee joint.  Able to form straight leg raises with both legs.  No calf tenderness.  Negative Homans' sign.  Specialty Comments:  MRI CERVICAL SPINE WITHOUT CONTRAST  TECHNIQUE: Multiplanar, multisequence MR imaging of the cervical spine was performed. No intravenous contrast was administered.   COMPARISON:  None.   FINDINGS: Alignment: 2 mm retrolisthesis of C5 on C6.   Vertebrae: No acute fracture, evidence of discitis, or bone lesion.   Cord: Normal signal and morphology.   Posterior Fossa, vertebral arteries, paraspinal tissues:  Posterior fossa demonstrates no focal abnormality. Vertebral artery flow voids are maintained. Paraspinal soft tissues are unremarkable.   Disc levels:   Discs: Degenerative disease with disc height loss at C4-5 and C5-6.   C2-3: No significant disc bulge. No neural foraminal stenosis. No central canal stenosis.   C3-4: Mild broad-based disc bulge. No foraminal or central canal stenosis.   C4-5: Mild broad-based disc bulge. No foraminal or central canal stenosis.   C5-6: Mild broad-based disc bulge. Bilateral uncovertebral degenerative changes. Moderate left and severe right foraminal stenosis. No spinal stenosis.   C6-7: Mild broad-based disc bulge. No foraminal or central canal stenosis.   C7-T1: No significant disc bulge. No neural foraminal stenosis. No central canal stenosis.   IMPRESSION: 1. At C5-6 there is a mild broad-based disc bulge. Bilateral uncovertebral degenerative changes. Moderate left and severe right foraminal stenosis. 2. Otherwise, mild cervical spine spondylosis as described above.     Electronically Signed   By: Elige Ko M.D.   On: 12/16/2021 10:51  Imaging: No results found.   PMFS History: Patient Active Problem List   Diagnosis Date Noted   High risk medication use 01/16/2023   Medication monitoring encounter 12/05/2021   Physical deconditioning 11/11/2021   Elevated serum lactate dehydrogenase (LDH) 08/17/2021   Vitamin D deficiency 08/17/2021   Chronic fatigue 07/25/2021   Vitamin B12 deficiency 07/19/2021   Hardening of the aorta (main artery of the heart) (HCC) 06/21/2021   Solitary lung nodule 06/15/2021   Loss of weight 05/18/2021   Protein-calorie malnutrition, severe (HCC) 05/18/2021   Osteoporosis 06/15/2020   Iron deficiency anemia 04/09/2020   Primary osteoarthritis involving multiple joints 12/31/2018   Rheumatoid arthritis involving multiple sites with positive rheumatoid factor (HCC) 12/31/2018   Irritable bowel  syndrome with both constipation and diarrhea 10/28/2018   Constipation 03/14/2016   Mitral valve prolapse 11/23/2015   High cholesterol 11/23/2015   Past Medical History:  Diagnosis Date   Anemia    Anxiety    Arthritis    RA   Bursitis    Cataract    CHF (congestive heart failure) (HCC)    Collagen vascular disease (HCC)    Depression    Endometriosis    Heart murmur    High cholesterol    Hypertension    IBS (irritable bowel syndrome)    Lymphadenopathy    Mitral valve prolapse    Osteoporosis    RA (rheumatoid arthritis) (HCC)    Rotator cuff tear    bilateral   Stroke (HCC) 06/2022   Uterus, adenomyosis     Family History  Problem Relation Age of Onset   Bone cancer Mother    Brain cancer Father    Brain cancer Sister    Breast cancer Sister    Renal Disease Sister    Pancreatic cancer Neg Hx    Esophageal cancer Neg Hx    Stomach cancer Neg Hx    Rectal cancer Neg Hx    Colon polyps Neg Hx     Past Surgical History:  Procedure Laterality Date   AXILLARY LYMPH NODE BIOPSY Right 10/12/2020   Procedure: RIGHT AXILLARY  LYMPH NODE BIOPSY EXCISION;  Surgeon: Almond Lint, MD;  Location: [facility];  Service: General;  Laterality: Right;   BUNIONECTOMY Right    CARPAL TUNNEL RELEASE Right    COLONOSCOPY     COLONOSCOPY N/A 12/13/2015   Procedure: COLONOSCOPY;  Surgeon: Malissa Hippo, MD;  [facility];  Service: Endoscopy;  Laterality: N/A;  9:55   Fatty tumor     2011 (left arm)   TONSILLECTOMY     TOTAL ABDOMINAL HYSTERECTOMY     2003   Social History   Occupational History   Occupation: retired  Tobacco Use   Smoking status: Some Days    Current packs/day: 0.00    Average packs/day: 0.5 packs/day for 33.0 years (16.5 ttl pk-yrs)    Types: Cigarettes    Start date: 12    Last attempt to quit: 2013    Years since quitting: 11.8   Smokeless tobacco: Never  Vaping Use   Vaping status: Never Used  Substance and Sexual  Activity   Alcohol use: No    Alcohol/week: 0.0 standard drinks of alcohol   Drug use: No   Sexual activity: Not Currently

## 2023-08-03 ENCOUNTER — Inpatient Hospital Stay: Payer: Medicare HMO

## 2023-08-03 ENCOUNTER — Other Ambulatory Visit: Payer: Self-pay

## 2023-08-03 ENCOUNTER — Other Ambulatory Visit: Payer: Self-pay | Admitting: Surgical

## 2023-08-03 ENCOUNTER — Encounter: Payer: Medicare HMO | Attending: Gastroenterology | Admitting: Internal Medicine

## 2023-08-03 VITALS — BP 118/54 | HR 102 | Temp 98.2°F | Resp 16

## 2023-08-03 DIAGNOSIS — D509 Iron deficiency anemia, unspecified: Secondary | ICD-10-CM | POA: Diagnosis not present

## 2023-08-03 DIAGNOSIS — D649 Anemia, unspecified: Secondary | ICD-10-CM

## 2023-08-03 DIAGNOSIS — D500 Iron deficiency anemia secondary to blood loss (chronic): Secondary | ICD-10-CM

## 2023-08-03 LAB — CBC WITH DIFFERENTIAL/PLATELET
Abs Immature Granulocytes: 0.08 10*3/uL — ABNORMAL HIGH (ref 0.00–0.07)
Basophils Absolute: 0 10*3/uL (ref 0.0–0.1)
Basophils Relative: 0 %
Eosinophils Absolute: 0 10*3/uL (ref 0.0–0.5)
Eosinophils Relative: 0 %
HCT: 28 % — ABNORMAL LOW (ref 36.0–46.0)
Hemoglobin: 8.6 g/dL — ABNORMAL LOW (ref 12.0–15.0)
Immature Granulocytes: 1 %
Lymphocytes Relative: 5 %
Lymphs Abs: 0.8 10*3/uL (ref 0.7–4.0)
MCH: 28.8 pg (ref 26.0–34.0)
MCHC: 30.7 g/dL (ref 30.0–36.0)
MCV: 93.6 fL (ref 80.0–100.0)
Monocytes Absolute: 0.6 10*3/uL (ref 0.1–1.0)
Monocytes Relative: 4 %
Neutro Abs: 15.4 10*3/uL — ABNORMAL HIGH (ref 1.7–7.7)
Neutrophils Relative %: 90 %
Platelets: 542 10*3/uL — ABNORMAL HIGH (ref 150–400)
RBC: 2.99 MIL/uL — ABNORMAL LOW (ref 3.87–5.11)
RDW: 15.5 % (ref 11.5–15.5)
WBC: 17 10*3/uL — ABNORMAL HIGH (ref 4.0–10.5)
nRBC: 0 % (ref 0.0–0.2)

## 2023-08-03 LAB — RETICULOCYTES
Immature Retic Fract: 24 % — ABNORMAL HIGH (ref 2.3–15.9)
RBC.: 2.96 MIL/uL — ABNORMAL LOW (ref 3.87–5.11)
Retic Count, Absolute: 82.6 10*3/uL (ref 19.0–186.0)
Retic Ct Pct: 2.8 % (ref 0.4–3.1)

## 2023-08-03 LAB — BILIRUBIN, DIRECT: Bilirubin, Direct: <0.1 mg/dL (ref 0.0–0.2)

## 2023-08-03 LAB — LACTATE DEHYDROGENASE: LDH: 139 U/L (ref 98–192)

## 2023-08-03 LAB — DIRECT ANTIGLOBULIN TEST (NOT AT ARMC)
DAT, IgG: NEGATIVE
DAT, complement: NEGATIVE

## 2023-08-03 LAB — BILIRUBIN, TOTAL: Total Bilirubin: 0.4 mg/dL (ref 0.3–1.2)

## 2023-08-03 MED ORDER — DIPHENHYDRAMINE HCL 25 MG PO CAPS: 25.0000 mg | ORAL_CAPSULE | Freq: Once | ORAL | Status: DC

## 2023-08-03 MED ORDER — ACETAMINOPHEN 325 MG PO TABS
650.0000 mg | ORAL_TABLET | Freq: Once | ORAL | Status: DC
Start: 2023-08-03 — End: 2023-08-03

## 2023-08-03 MED ORDER — IRON SUCROSE 20 MG/ML IV SOLN
200.0000 mg | Freq: Once | INTRAVENOUS | Status: AC
  Administered 2023-08-03: 200 mg via INTRAVENOUS
  Filled 2023-08-03: qty 10

## 2023-08-03 NOTE — Progress Notes (Addendum)
Diagnosis: Rheumatoid Arthritis  Provider:   Vern Claude. Ellin Saba, MD  Procedure: IV push  IV Type: Peripheral, IV Location: L Antecubital  Venofer (Iron Sucrose), Dose: 200 mg   Post Infusion IV Care: Observation period completed  Discharge: Condition: Good, Destination: Home . AVS Provided  Performed by:  Feliberto Harts, LPN

## 2023-08-04 LAB — HAPTOGLOBIN: Haptoglobin: 273 mg/dL (ref 37–355)

## 2023-08-05 MED ORDER — METHYLPREDNISOLONE ACETATE 40 MG/ML IJ SUSP
40.0000 mg | INTRAMUSCULAR | Status: AC | PRN
  Administered 2023-08-02: 40 mg via INTRA_ARTICULAR

## 2023-08-05 MED ORDER — BUPIVACAINE HCL 0.25 % IJ SOLN
4.0000 mL | INTRAMUSCULAR | Status: AC | PRN
  Administered 2023-08-02: 4 mL via INTRA_ARTICULAR

## 2023-08-05 MED ORDER — LIDOCAINE HCL 1 % IJ SOLN
3.0000 mL | INTRAMUSCULAR | Status: AC | PRN
  Administered 2023-08-02: 3 mL

## 2023-08-05 MED ORDER — BUPIVACAINE HCL 0.5 % IJ SOLN
1.0000 mL | INTRAMUSCULAR | Status: AC | PRN
  Administered 2023-08-02: 1 mL via INTRA_ARTICULAR

## 2023-08-05 MED ORDER — LIDOCAINE HCL 1 % IJ SOLN
5.0000 mL | INTRAMUSCULAR | Status: AC | PRN
  Administered 2023-08-02: 5 mL

## 2023-08-07 ENCOUNTER — Telehealth: Payer: Self-pay

## 2023-08-07 ENCOUNTER — Ambulatory Visit: Payer: Medicare HMO | Admitting: Sports Medicine

## 2023-08-07 NOTE — Telephone Encounter (Signed)
Patient called asking if she needed a blood transfusion.  Reviewed labs with the provider and called the patient to review guidelines for a blood transfusion on the cancer center.  Patient became very angry over the phone and stated the "normal hgb is 10 and anything below requires a transfusion".  The patient stated she is keeping her iron infusions as scheduled.    Instructed the patient if she feels she needs a blood transfusion to call her PCP with understanding verbalized.

## 2023-08-10 ENCOUNTER — Inpatient Hospital Stay: Payer: Medicare HMO

## 2023-08-13 ENCOUNTER — Telehealth: Payer: Self-pay

## 2023-08-14 ENCOUNTER — Telehealth (HOSPITAL_COMMUNITY): Payer: Self-pay | Admitting: *Deleted

## 2023-08-14 NOTE — Telephone Encounter (Signed)
Patient called into the clinic stating she didn't tolerate Venofer 200 mg IV push well. Patient c/o having IBS symptoms for 6 days after the IV push. Patient wants to to know what can be done differently. Patient's last IV push iron was on 11/1 at the infusion. Patient is scheduled for iron infusion Friday at the infusion clinic. Rojelio Brenner, PA-C made aware and recommended Venofer be given as an IV piggyback and give pre-meds Tylenol and Cetirizine as well. Marin Shutter, RN/ Desma Mcgregor, [facility] made aware and agreed Venofer can be given to patient as a IV piggy back. Patient called and made aware, and pt verbalized understanding.

## 2023-08-16 ENCOUNTER — Other Ambulatory Visit: Payer: Self-pay

## 2023-08-16 ENCOUNTER — Ambulatory Visit: Payer: Medicare HMO | Admitting: Orthopedic Surgery

## 2023-08-17 ENCOUNTER — Encounter: Payer: Medicare HMO | Admitting: *Deleted

## 2023-08-17 ENCOUNTER — Ambulatory Visit: Payer: Medicare HMO | Admitting: Sports Medicine

## 2023-08-17 ENCOUNTER — Other Ambulatory Visit: Payer: Self-pay

## 2023-08-17 ENCOUNTER — Inpatient Hospital Stay: Payer: Medicare HMO

## 2023-08-17 ENCOUNTER — Other Ambulatory Visit: Payer: Self-pay | Admitting: Emergency Medicine

## 2023-08-17 VITALS — BP 119/69 | HR 107 | Temp 98.2°F | Resp 20

## 2023-08-17 DIAGNOSIS — D509 Iron deficiency anemia, unspecified: Secondary | ICD-10-CM

## 2023-08-17 DIAGNOSIS — D500 Iron deficiency anemia secondary to blood loss (chronic): Secondary | ICD-10-CM

## 2023-08-17 LAB — CBC WITH DIFFERENTIAL/PLATELET
Absolute Lymphocytes: 1932 {cells}/uL (ref 850–3900)
Absolute Monocytes: 695 {cells}/uL (ref 200–950)
Basophils Absolute: 56 {cells}/uL (ref 0–200)
Basophils Relative: 0.4 %
Eosinophils Absolute: 70 {cells}/uL (ref 15–500)
Eosinophils Relative: 0.5 %
HCT: 28.5 % — ABNORMAL LOW (ref 35.0–45.0)
Hemoglobin: 8.8 g/dL — ABNORMAL LOW (ref 11.7–15.5)
MCH: 27.6 pg (ref 27.0–33.0)
MCHC: 30.9 g/dL — ABNORMAL LOW (ref 32.0–36.0)
MCV: 89.3 fL (ref 80.0–100.0)
MPV: 9.4 fL (ref 7.5–12.5)
Monocytes Relative: 5 %
Neutro Abs: 11148 {cells}/uL — ABNORMAL HIGH (ref 1500–7800)
Neutrophils Relative %: 80.2 %
Platelets: 475 10*3/uL — ABNORMAL HIGH (ref 140–400)
RBC: 3.19 10*6/uL — ABNORMAL LOW (ref 3.80–5.10)
RDW: 14.3 % (ref 11.0–15.0)
Total Lymphocyte: 13.9 %
WBC: 13.9 10*3/uL — ABNORMAL HIGH (ref 3.8–10.8)

## 2023-08-17 MED ORDER — ACETAMINOPHEN 325 MG PO TABS: 650.0000 mg | ORAL_TABLET | Freq: Once | ORAL | Status: DC

## 2023-08-17 MED ORDER — IRON SUCROSE 200 MG IVPB - SIMPLE MED
200.0000 mg | Freq: Once | Status: AC
  Administered 2023-08-17: 200 mg via INTRAVENOUS
  Filled 2023-08-17: qty 110

## 2023-08-17 MED ORDER — DIPHENHYDRAMINE HCL 25 MG PO CAPS: 25.0000 mg | ORAL_CAPSULE | Freq: Once | ORAL | Status: DC

## 2023-08-17 MED ORDER — IRON SUCROSE 20 MG/ML IV SOLN
200.0000 mg | Freq: Once | INTRAVENOUS | Status: DC
Start: 2023-08-17 — End: 2023-08-17

## 2023-08-17 NOTE — Progress Notes (Cosign Needed Addendum)
Diagnosis: Acute Anemia  Provider:  Dr. Ellin Saba  Procedure: IV Infusion  IV Type: Peripheral, IV Location: L Antecubital  Venofer (Iron Sucrose), Dose: 200 mg  Infusion Start Time: 1345  Infusion Stop Time: 1400  Post Infusion IV Care: Observation period completed  Discharge: Condition: Good, Destination: Home . AVS Provided  Performed by:  Daleen Squibb, RN

## 2023-08-20 ENCOUNTER — Other Ambulatory Visit: Payer: Self-pay | Admitting: Internal Medicine

## 2023-08-20 ENCOUNTER — Ambulatory Visit: Payer: Medicare HMO | Admitting: Physician Assistant

## 2023-08-20 NOTE — Telephone Encounter (Signed)
Last Fill: 05/01/2023   Eye exam: Cannot leave message, mailbox full   Labs: 08/17/2023 CBC WBC 13.9, RBC 3.19, Hemoglobin 8.8, HCT 28.5, MCHC 30.9, Platelets 475, Neutro Abs 11,148 06/14/2023 CMP Glucose 121, Alkaline Phosphatase 142,   Next Visit: 10/09/2023  Last Visit: 07/04/2023  [id number] arthritis involving multiple sites with positive rheumatoid factor   Current Dose per office note 07/04/2023: At that time recommended to start hydroxychloroquine for inflammatory arthritis symptoms   Okay to refill Plaquenil?

## 2023-08-21 ENCOUNTER — Ambulatory Visit (INDEPENDENT_AMBULATORY_CARE_PROVIDER_SITE_OTHER): Payer: Medicare HMO | Admitting: Sports Medicine

## 2023-08-21 ENCOUNTER — Encounter: Payer: Self-pay | Admitting: Sports Medicine

## 2023-08-21 VITALS — Wt <= 1120 oz

## 2023-08-21 DIAGNOSIS — R627 Adult failure to thrive: Secondary | ICD-10-CM

## 2023-08-21 DIAGNOSIS — M25571 Pain in right ankle and joints of right foot: Secondary | ICD-10-CM

## 2023-08-21 DIAGNOSIS — M7551 Bursitis of right shoulder: Secondary | ICD-10-CM | POA: Diagnosis not present

## 2023-08-21 DIAGNOSIS — M546 Pain in thoracic spine: Secondary | ICD-10-CM | POA: Diagnosis not present

## 2023-08-21 DIAGNOSIS — M76821 Posterior tibial tendinitis, right leg: Secondary | ICD-10-CM

## 2023-08-21 DIAGNOSIS — M810 Age-related osteoporosis without current pathological fracture: Secondary | ICD-10-CM

## 2023-08-21 DIAGNOSIS — G8929 Other chronic pain: Secondary | ICD-10-CM | POA: Diagnosis not present

## 2023-08-21 DIAGNOSIS — M25511 Pain in right shoulder: Secondary | ICD-10-CM | POA: Diagnosis not present

## 2023-08-21 MED ORDER — BUPIVACAINE HCL 0.25 % IJ SOLN
1.0000 mL | INTRAMUSCULAR | Status: AC | PRN
  Administered 2023-08-21: 1 mL

## 2023-08-21 MED ORDER — METHYLPREDNISOLONE ACETATE 40 MG/ML IJ SUSP
40.0000 mg | INTRAMUSCULAR | Status: AC | PRN
  Administered 2023-08-21: 40 mg via INTRAMUSCULAR

## 2023-08-21 MED ORDER — BUPIVACAINE HCL 0.25 % IJ SOLN
1.0000 mL | INTRAMUSCULAR | Status: AC | PRN
  Administered 2023-08-21: 1 mL via INTRA_ARTICULAR

## 2023-08-21 MED ORDER — LIDOCAINE HCL 1 % IJ SOLN
1.0000 mL | INTRAMUSCULAR | Status: AC | PRN
  Administered 2023-08-21: 1 mL

## 2023-08-21 MED ORDER — METHYLPREDNISOLONE ACETATE 40 MG/ML IJ SUSP
40.0000 mg | INTRAMUSCULAR | Status: AC | PRN
  Administered 2023-08-21: 40 mg via INTRA_ARTICULAR

## 2023-08-21 NOTE — Progress Notes (Signed)
Vanessa Lara - 67 y.o. female MRN [id number]  Date of birth: 02/27/1956  Office Visit Note: Visit Date: 08/21/2023 PCP: [name], NP Referred by: [name], NP  Subjective: Chief Complaint  Patient presents with   Right Shoulder - Pain   Right Ankle - Pain   HPI: Vanessa Lara is a pleasant 67 y.o. female who presents today for  acute on chronic right scapular pain, also with right ankle pain.  Had R-shoulder scapulothoracic injection in August of this last year.   Does have a history of rheumatoid arthritis, managed on Plaquenil 200 mg daily. She also has a history of diminished bone density, history of Fosamax use. She did have an appointment with [facility] for our osteoporosis clinic, but mixed up her days and missed this appointment. Per note review with Karenann Cai on 08/02/23, she Currently takes Reclast for her osteoporosis and her first infusion was in October.  Has had chronic right ankle pain.  Clinic did give her an ankle injection in the past with only some relief.  She points more to her pain being over the medial aspect of the posterior tibial tendon.  Wearing an ankle brace.  Pertinent ROS were reviewed with the patient and found to be negative unless otherwise specified above in HPI.   Assessment & Plan: Visit Diagnoses:  1. Scapulothoracic bursitis of right shoulder   2. Age-related osteoporosis without current pathological fracture   3. Failure to thrive in adult   4. Chronic right shoulder pain   5. Pain in right ankle and joints of right foot   6. Posterior tibial tendinitis, right leg   7. Trigger point of thoracic region    Plan: Impression is acute on chronic exacerbation of scapulothoracic bursitis with right shoulder pain and associated scapular dyskinesia. She did get excellent relief from prior scapulothoracic injection of the right shoulder back in August.  Through shared decision making we did proceed with this injection into the  right shoulder/STB today.  She also had an associated trigger point in the thoracic and scapular musculature, trigger point performed.  We discussed her significant weight loss with FTT, she is following up with primary care and GI.  Discussed importance of healthy weight gain as her weight continues to drop. She will continue her Reclast injections 5 mg yearly for her osteoporosis, I would like her to make her appointment for the osteoporosis clinic with [facility].  She may use Flexeril 5 mg twice daily as needed for any muscle related pains or spasms.  Her ankle pain is likely degree of posterior tibial insufficiency, she may continue her brace and does have follow-up with Dr. Lajoyce Corners in 2 days.  She will follow-up with me as needed.  Follow-up: Return for make appt with [facility] for osteoporosis clinic .   Meds & Orders: No orders of the defined types were placed in this encounter.   Orders Placed This Encounter  Procedures   Large Joint Inj   Trigger Point Inj     Procedures: Large Joint Inj: R glenohumeral on 08/21/2023 11:28 AM Indications: pain Details: 22 G 3.5 in needle, ultrasound-guided posterior approach Medications: 1 mL lidocaine 1 %; 1 mL bupivacaine 0.25 %; 40 mg methylPREDNISolone acetate 40 MG/ML Outcome: tolerated well, no immediate complications  Procedure: Right-shoulder scapulothoracic bursa injection - technically successful injection. Patient in-prone position with needle from medial to lateral direction over inferior border of scapula. Procedure, treatment alternatives, risks and benefits explained, specific risks discussed. Consent  was given by the patient. Immediately prior to procedure a time out was called to verify the correct patient, procedure, equipment, support staff and site/side marked as required. Patient was prepped and draped in the usual sterile fashion.    Trigger Point Inj  Date/Time: 08/21/2023 11:29 AM  Performed by: Madelyn Brunner,  DO Authorized by: Madelyn Brunner, DO   Consent Given by:  Patient Site marked: the procedure site was marked   Timeout: prior to procedure the correct patient, procedure, and site was verified   Indications:  Pain and muscle spasm Total # of Trigger Points:  1 Location: back and shoulder   Needle Size:  25 G Approach:  Dorsal Medications #1:  1 mL lidocaine 1 %; 1 mL bupivacaine 0.25 %; 40 mg methylPREDNISolone acetate 40 MG/ML Patient tolerance:  Patient tolerated the procedure well with no immediate complications Comments: Procedure: Trigger point injection, right thoracic paraspinal musculature After discussion on R/B/I and informed verbal consent was obtained, a timeout was conducted. The patient was placed in a prone position on the examination table and the area of maximal tenderness was identified over the right thoracic paraspinal musculature.  This area was cleansed with Betadine and multiple alcohol swabs. Ethyl chloride was used for local anesthesia. Using a 25-gauge 1.0 inch needle the trigger point was subsequently injected with a mixture of 1.0 cc of methylprednisolone 40 mg/mL and 1 cc of 1% lidocaine without epinephrine and 1 cc of bupivicaine 0.25%. A band-aid was applied following. Patient tolerated procedure well, there were no post-injection complications. Post-procedure instructions were given.         Clinical History:   She reports that she has been smoking cigarettes. She started smoking about 44 years ago. She has a 16.5 pack-year smoking history. She has never used smokeless tobacco.  Recent Labs    11/02/22 1122  HGBA1C 5.1    Objective:   Vital Signs: Wt 66 lb 12.8 oz (30.3 kg)   BMI 13.05 kg/m   Physical Exam  Gen: Cachetic-appearing, in no acute distress; non-toxic CV: Well-perfused. Warm.  Resp: Breathing unlabored on room air; no wheezing. Psych: Fluid speech in conversation; appropriate affect; normal thought process  Ortho Exam - Right  shoulder/Thoracic spine: + TTP over the medial and inferior aspect of the scapular border with some scapular dyskinesis although no scapular winging.  There is mild crepitus taking the shoulder through all planes of motion.  There is some thoracic paraspinal hypertonicity and associated trigger point over the medial scapular border.  Right ankle: Positive TTP over the course of the posterior tibial tendon.  There is pain with resisted plantarflexion and heel raise.  Motion with dorsiflexion and plantarflexion of the ankle.  No effusion.  Imaging: No results found.  Past Medical/Family/Surgical/Social History: Medications & Allergies reviewed per EMR, new medications updated. Patient Active Problem List   Diagnosis Date Noted   High risk medication use 01/16/2023   Medication monitoring encounter 12/05/2021   Physical deconditioning 11/11/2021   Elevated serum lactate dehydrogenase (LDH) 08/17/2021   Vitamin D deficiency 08/17/2021   Chronic fatigue 07/25/2021   Vitamin B12 deficiency 07/19/2021   Hardening of the aorta (main artery of the heart) (HCC) 06/21/2021   Solitary lung nodule 06/15/2021   Loss of weight 05/18/2021   Protein-calorie malnutrition, severe (HCC) 05/18/2021   Osteoporosis 06/15/2020   Iron deficiency anemia 04/09/2020   Primary osteoarthritis involving multiple joints 12/31/2018   Rheumatoid arthritis involving multiple sites with positive rheumatoid factor (HCC) 12/31/2018  Irritable bowel syndrome with both constipation and diarrhea 10/28/2018   Constipation 03/14/2016   Mitral valve prolapse 11/23/2015   High cholesterol 11/23/2015   Past Medical History:  Diagnosis Date   Anemia    Anxiety    Arthritis    RA   Bursitis    Cataract    CHF (congestive heart failure) (HCC)    Collagen vascular disease (HCC)    Depression    Endometriosis    Heart murmur    High cholesterol    Hypertension    IBS (irritable bowel syndrome)    Lymphadenopathy     Mitral valve prolapse    Osteoporosis    RA (rheumatoid arthritis) (HCC)    Rotator cuff tear    bilateral   Stroke (HCC) 06/2022   Uterus, adenomyosis    Family History  Problem Relation Age of Onset   Bone cancer Mother    Brain cancer Father    Brain cancer Sister    Breast cancer Sister    Renal Disease Sister    Pancreatic cancer Neg Hx    Esophageal cancer Neg Hx    Stomach cancer Neg Hx    Rectal cancer Neg Hx    Colon polyps Neg Hx    Past Surgical History:  Procedure Laterality Date   AXILLARY LYMPH NODE BIOPSY Right 10/12/2020   Procedure: RIGHT AXILLARY LYMPH NODE BIOPSY EXCISION;  Surgeon: Almond Lint, MD;  Location: [facility];  Service: General;  Laterality: Right;   BUNIONECTOMY Right    CARPAL TUNNEL RELEASE Right    COLONOSCOPY     COLONOSCOPY N/A 12/13/2015   Procedure: COLONOSCOPY;  Surgeon: Malissa Hippo, MD;  [facility];  Service: Endoscopy;  Laterality: N/A;  9:55   Fatty tumor     2011 (left arm)   TONSILLECTOMY     TOTAL ABDOMINAL HYSTERECTOMY     2003   Social History   Occupational History   Occupation: retired  Tobacco Use   Smoking status: Some Days    Current packs/day: 0.00    Average packs/day: 0.5 packs/day for 33.0 years (16.5 ttl pk-yrs)    Types: Cigarettes    Start date: 74    Last attempt to quit: 2013    Years since quitting: 11.8   Smokeless tobacco: Never  Vaping Use   Vaping status: Never Used  Substance and Sexual Activity   Alcohol use: No    Alcohol/week: 0.0 standard drinks of alcohol   Drug use: No   Sexual activity: Not Currently

## 2023-08-23 ENCOUNTER — Ambulatory Visit (INDEPENDENT_AMBULATORY_CARE_PROVIDER_SITE_OTHER): Payer: Medicare HMO | Admitting: Orthopedic Surgery

## 2023-08-23 DIAGNOSIS — M25571 Pain in right ankle and joints of right foot: Secondary | ICD-10-CM

## 2023-08-24 ENCOUNTER — Other Ambulatory Visit: Payer: Self-pay | Admitting: Emergency Medicine

## 2023-08-24 ENCOUNTER — Inpatient Hospital Stay: Payer: Medicare HMO

## 2023-08-24 ENCOUNTER — Encounter (INDEPENDENT_AMBULATORY_CARE_PROVIDER_SITE_OTHER): Payer: Medicare HMO | Admitting: Emergency Medicine

## 2023-08-24 VITALS — BP 112/49 | HR 107 | Temp 98.1°F | Resp 18

## 2023-08-24 DIAGNOSIS — D509 Iron deficiency anemia, unspecified: Secondary | ICD-10-CM

## 2023-08-24 DIAGNOSIS — D500 Iron deficiency anemia secondary to blood loss (chronic): Secondary | ICD-10-CM

## 2023-08-24 LAB — CBC WITH DIFFERENTIAL/PLATELET
Absolute Lymphocytes: 2204 {cells}/uL (ref 850–3900)
Absolute Monocytes: 836 {cells}/uL (ref 200–950)
Basophils Absolute: 106 {cells}/uL (ref 0–200)
Basophils Relative: 0.7 %
Eosinophils Absolute: 61 {cells}/uL (ref 15–500)
Eosinophils Relative: 0.4 %
HCT: 28.8 % — ABNORMAL LOW (ref 35.0–45.0)
Hemoglobin: 9.2 g/dL — ABNORMAL LOW (ref 11.7–15.5)
MCH: 28.1 pg (ref 27.0–33.0)
MCHC: 31.9 g/dL — ABNORMAL LOW (ref 32.0–36.0)
MCV: 88.1 fL (ref 80.0–100.0)
MPV: 9.6 fL (ref 7.5–12.5)
Monocytes Relative: 5.5 %
Neutro Abs: 11993 {cells}/uL — ABNORMAL HIGH (ref 1500–7800)
Neutrophils Relative %: 78.9 %
Platelets: 528 10*3/uL — ABNORMAL HIGH (ref 140–400)
RBC: 3.27 10*6/uL — ABNORMAL LOW (ref 3.80–5.10)
RDW: 14.6 % (ref 11.0–15.0)
Total Lymphocyte: 14.5 %
WBC: 15.2 10*3/uL — ABNORMAL HIGH (ref 3.8–10.8)

## 2023-08-24 MED ORDER — DIPHENHYDRAMINE HCL 25 MG PO CAPS: 25.0000 mg | ORAL_CAPSULE | Freq: Once | ORAL | Status: DC

## 2023-08-24 MED ORDER — ACETAMINOPHEN 325 MG PO TABS: 650.0000 mg | ORAL_TABLET | Freq: Once | ORAL | Status: DC

## 2023-08-24 MED ORDER — IRON SUCROSE 20 MG/ML IV SOLN
200.0000 mg | Freq: Once | INTRAVENOUS | Status: AC
  Administered 2023-08-24: 200 mg via INTRAVENOUS
  Filled 2023-08-24: qty 10

## 2023-08-24 NOTE — Progress Notes (Signed)
Diagnosis: Anemia in Chronic Kidney Disease  Provider:   Dr Ellin Saba  Procedure: IV Infusion  IV Type: Peripheral, IV Location: L Antecubital  Venofer (Iron Sucrose), Dose: 200 mg  Infusion Start Time: 1414  Infusion Stop Time: 1433  Post Infusion IV Care: Observation period completed and Peripheral IV Discontinued  Discharge: Condition: Good, Destination: Home . AVS Provided  Performed by:  Arrie Senate, RN

## 2023-08-29 ENCOUNTER — Inpatient Hospital Stay: Payer: Medicare HMO

## 2023-08-29 ENCOUNTER — Other Ambulatory Visit: Payer: Self-pay | Admitting: Emergency Medicine

## 2023-08-29 ENCOUNTER — Encounter: Payer: Medicare HMO | Admitting: *Deleted

## 2023-08-29 VITALS — BP 103/44 | HR 107 | Temp 97.8°F | Resp 18

## 2023-08-29 DIAGNOSIS — D500 Iron deficiency anemia secondary to blood loss (chronic): Secondary | ICD-10-CM

## 2023-08-29 DIAGNOSIS — D509 Iron deficiency anemia, unspecified: Secondary | ICD-10-CM

## 2023-08-29 LAB — CBC WITH DIFFERENTIAL/PLATELET
Absolute Lymphocytes: 2275 {cells}/uL (ref 850–3900)
Absolute Monocytes: 850 {cells}/uL (ref 200–950)
Basophils Absolute: 75 {cells}/uL (ref 0–200)
Basophils Relative: 0.6 %
Eosinophils Absolute: 75 {cells}/uL (ref 15–500)
Eosinophils Relative: 0.6 %
HCT: 28.6 % — ABNORMAL LOW (ref 35.0–45.0)
Hemoglobin: 9.3 g/dL — ABNORMAL LOW (ref 11.7–15.5)
MCH: 28.8 pg (ref 27.0–33.0)
MCHC: 32.5 g/dL (ref 32.0–36.0)
MCV: 88.5 fL (ref 80.0–100.0)
MPV: 9.7 fL (ref 7.5–12.5)
Monocytes Relative: 6.8 %
Neutro Abs: 9225 {cells}/uL — ABNORMAL HIGH (ref 1500–7800)
Neutrophils Relative %: 73.8 %
Platelets: 511 10*3/uL — ABNORMAL HIGH (ref 140–400)
RBC: 3.23 10*6/uL — ABNORMAL LOW (ref 3.80–5.10)
RDW: 15.6 % — ABNORMAL HIGH (ref 11.0–15.0)
Total Lymphocyte: 18.2 %
WBC: 12.5 10*3/uL — ABNORMAL HIGH (ref 3.8–10.8)

## 2023-08-29 MED ORDER — ACETAMINOPHEN 325 MG PO TABS: 650.0000 mg | ORAL_TABLET | Freq: Once | ORAL | Status: DC

## 2023-08-29 MED ORDER — SODIUM CHLORIDE 0.9 % IV SOLN
200.0000 mg | Freq: Once | INTRAVENOUS | Status: AC
  Administered 2023-08-29: 200 mg via INTRAVENOUS
  Filled 2023-08-29: qty 10

## 2023-08-29 MED ORDER — DIPHENHYDRAMINE HCL 25 MG PO CAPS: 25.0000 mg | ORAL_CAPSULE | Freq: Once | ORAL | Status: DC

## 2023-08-29 MED ORDER — SODIUM CHLORIDE 0.9% FLUSH: 10.0000 mL | Freq: Once | INTRAVENOUS | Status: DC | PRN

## 2023-08-29 NOTE — Progress Notes (Signed)
Diagnosis: Acute Anemia  Provider:   Marice Potter, MD  Procedure: IV Infusion  IV Type: Peripheral, IV Location: L Antecubital  Venofer (Iron Sucrose), Dose: 200 mg  Infusion Start Time  1410  Infusion Stop Time: 1425  Post Infusion IV Care: Observation period completed  Discharge: Condition: Good, Destination: Home . AVS Provided  Performed by:  Daleen Squibb, RN

## 2023-09-03 ENCOUNTER — Encounter: Payer: Self-pay | Admitting: Orthopedic Surgery

## 2023-09-03 ENCOUNTER — Ambulatory Visit: Payer: Medicare HMO | Admitting: Physician Assistant

## 2023-09-03 NOTE — Progress Notes (Signed)
Office Visit Note   Patient: Vanessa Lara           Date of Birth: 1956/04/10           MRN: [id number] Visit Date: 08/23/2023              Requested by: Julieanne Cotton, PA-C [address] [place],  [place] [address] PCP: [name], NP  Chief Complaint  Patient presents with   Right Ankle - Pain      HPI: Patient is a 67 year old woman who presents with right ankle and right foot pain she is currently wearing an ankle brace.  Patient states she has pain over the medial aspect of the right ankle.  Patient states that her hemoglobin is around 8 and she is symptomatic.  Patient is status post an MRI scan.  Patient is also status post stroke.  Assessment & Plan: Visit Diagnoses:  1. Pain in right ankle and joints of right foot     Plan: Examination patient has a good dorsalis pedis pulse.  She has hallux rigidus of the right great toe.  Patient has tenderness to palpation across the midfoot and has tenderness to palpation over the posterior tibial tendon.  Follow-Up Instructions: Return in about 4 weeks (around 09/20/2023).   Ortho Exam  Patient is alert, oriented, no adenopathy, well-dressed, normal affect, normal respiratory effort. Recommended follow-up with heme-onc for evaluation of her anemia.  For the posterior tibial tendinitis and midfoot pain will place her in a short fracture boot.  Plan for three-view radiographs of the right foot at follow-up.  Patient was previously scheduled for an MTP fusion of the great toe.  Imaging: No results found. No images are attached to the encounter.  Labs: Lab Results  Component Value Date   HGBA1C 5.1 11/02/2022   HGBA1C 5.7 (H) 03/19/2020   HGBA1C 5.5 11/06/2019   ESRSEDRATE 82 (H) 07/04/2023   ESRSEDRATE 55 (H) 01/16/2023   ESRSEDRATE 46 (H) 10/03/2022   CRP 4.4 10/03/2022   CRP 32.9 (H) 12/05/2021   CRP 1.7 (H) 08/23/2020     Lab Results  Component Value Date   ALBUMIN 3.6 06/14/2023   ALBUMIN 3.8  05/21/2023   ALBUMIN 3.4 (L) 02/02/2023    Lab Results  Component Value Date   MG 2.0 06/14/2023   Lab Results  Component Value Date   VD25OH 80 07/04/2023   VD25OH 72.68 02/02/2023   VD25OH 62 05/18/2022    No results found for: "PREALBUMIN"    Latest Ref Rng & Units 08/29/2023    2:15 PM 08/24/2023    2:17 PM 08/17/2023    1:45 PM  CBC EXTENDED  WBC 3.8 - 10.8 Thousand/uL 12.5  15.2  13.9   RBC 3.80 - 5.10 Million/uL 3.23  3.27  3.19   Hemoglobin 11.7 - 15.5 g/dL 9.3  9.2  8.8   HCT 19.1 - 45.0 % 28.6  28.8  28.5   Platelets 140 - 400 Thousand/uL 511  528  475   NEUT# 1,500 - 7,800 cells/uL 9,225  11,993  11,148      There is no height or weight on file to calculate BMI.  Orders:  No orders of the defined types were placed in this encounter.  No orders of the defined types were placed in this encounter.    Procedures: No procedures performed  Clinical Data: No additional findings.  ROS:  All other systems negative, except as noted in the HPI. Review of  Systems  Objective: Vital Signs: There were no vitals taken for this visit.  Specialty Comments:  MRI CERVICAL SPINE WITHOUT CONTRAST   TECHNIQUE: Multiplanar, multisequence MR imaging of the cervical spine was performed. No intravenous contrast was administered.   COMPARISON:  None.   FINDINGS: Alignment: 2 mm retrolisthesis of C5 on C6.   Vertebrae: No acute fracture, evidence of discitis, or bone lesion.   Cord: Normal signal and morphology.   Posterior Fossa, vertebral arteries, paraspinal tissues: Posterior fossa demonstrates no focal abnormality. Vertebral artery flow voids are maintained. Paraspinal soft tissues are unremarkable.   Disc levels:   Discs: Degenerative disease with disc height loss at C4-5 and C5-6.   C2-3: No significant disc bulge. No neural foraminal stenosis. No central canal stenosis.   C3-4: Mild broad-based disc bulge. No foraminal or central  canal stenosis.   C4-5: Mild broad-based disc bulge. No foraminal or central canal stenosis.   C5-6: Mild broad-based disc bulge. Bilateral uncovertebral degenerative changes. Moderate left and severe right foraminal stenosis. No spinal stenosis.   C6-7: Mild broad-based disc bulge. No foraminal or central canal stenosis.   C7-T1: No significant disc bulge. No neural foraminal stenosis. No central canal stenosis.   IMPRESSION: 1. At C5-6 there is a mild broad-based disc bulge. Bilateral uncovertebral degenerative changes. Moderate left and severe right foraminal stenosis. 2. Otherwise, mild cervical spine spondylosis as described above.     Electronically Signed   By: Elige Ko M.D.   On: 12/16/2021 10:51  PMFS History: Patient Active Problem List   Diagnosis Date Noted   High risk medication use 01/16/2023   Medication monitoring encounter 12/05/2021   Physical deconditioning 11/11/2021   Elevated serum lactate dehydrogenase (LDH) 08/17/2021   Vitamin D deficiency 08/17/2021   Chronic fatigue 07/25/2021   Vitamin B12 deficiency 07/19/2021   Hardening of the aorta (main artery of the heart) (HCC) 06/21/2021   Solitary lung nodule 06/15/2021   Loss of weight 05/18/2021   Protein-calorie malnutrition, severe (HCC) 05/18/2021   Osteoporosis 06/15/2020   Iron deficiency anemia 04/09/2020   Primary osteoarthritis involving multiple joints 12/31/2018   Rheumatoid arthritis involving multiple sites with positive rheumatoid factor (HCC) 12/31/2018   Irritable bowel syndrome with both constipation and diarrhea 10/28/2018   Constipation 03/14/2016   Mitral valve prolapse 11/23/2015   High cholesterol 11/23/2015   Past Medical History:  Diagnosis Date   Anemia    Anxiety    Arthritis    RA   Bursitis    Cataract    CHF (congestive heart failure) (HCC)    Collagen vascular disease (HCC)    Depression    Endometriosis    Heart murmur    High cholesterol     Hypertension    IBS (irritable bowel syndrome)    Lymphadenopathy    Mitral valve prolapse    Osteoporosis    RA (rheumatoid arthritis) (HCC)    Rotator cuff tear    bilateral   Stroke (HCC) 06/2022   Uterus, adenomyosis     Family History  Problem Relation Age of Onset   Bone cancer Mother    Brain cancer Father    Brain cancer Sister    Breast cancer Sister    Renal Disease Sister    Pancreatic cancer Neg Hx    Esophageal cancer Neg Hx    Stomach cancer Neg Hx    Rectal cancer Neg Hx    Colon polyps Neg Hx     Past Surgical  History:  Procedure Laterality Date   AXILLARY LYMPH NODE BIOPSY Right 10/12/2020   Procedure: RIGHT AXILLARY LYMPH NODE BIOPSY EXCISION;  Surgeon: Almond Lint, MD;  Location: [facility];  Service: General;  Laterality: Right;   BUNIONECTOMY Right    CARPAL TUNNEL RELEASE Right    COLONOSCOPY     COLONOSCOPY N/A 12/13/2015   Procedure: COLONOSCOPY;  Surgeon: Malissa Hippo, MD;  Location: [facility];  Service: Endoscopy;  Laterality: N/A;  9:55   Fatty tumor     2011 (left arm)   TONSILLECTOMY     TOTAL ABDOMINAL HYSTERECTOMY     2003   Social History   Occupational History   Occupation: retired  Tobacco Use   Smoking status: Some Days    Current packs/day: 0.00    Average packs/day: 0.5 packs/day for 33.0 years (16.5 ttl pk-yrs)    Types: Cigarettes    Start date: 76    Last attempt to quit: 2013    Years since quitting: 11.9   Smokeless tobacco: Never  Vaping Use   Vaping status: Never Used  Substance and Sexual Activity   Alcohol use: No    Alcohol/week: 0.0 standard drinks of alcohol   Drug use: No   Sexual activity: Not Currently

## 2023-09-04 ENCOUNTER — Inpatient Hospital Stay: Payer: Medicare HMO | Admitting: Physician Assistant

## 2023-09-04 ENCOUNTER — Other Ambulatory Visit: Payer: Self-pay | Admitting: Physician Assistant

## 2023-09-04 DIAGNOSIS — D500 Iron deficiency anemia secondary to blood loss (chronic): Secondary | ICD-10-CM

## 2023-09-04 DIAGNOSIS — D649 Anemia, unspecified: Secondary | ICD-10-CM

## 2023-09-04 NOTE — Progress Notes (Unsigned)
[facility] [address], [place] [address]   CLINIC:  Medical Oncology/Hematology  PCP:  [name], NP [address] [place] [address] [phone]   REASON FOR VISIT:  Follow-up for normocytic anemia   CURRENT THERAPY: Intermittent IV iron  INTERVAL HISTORY:   Ms. Midgette 67 y.o. female returns for routine follow-up of normocytic anemia.  She was last evaluated via telemedicine visit by Rojelio Brenner PA-C on 07/25/2023.  At today's visit, she reports feeling poorly. *** She continues to suffer from chronic pain, malnutrition, weight loss, severe fatigue, and general malaise. *** She is seeing multiple specialists for workup and management of these chronic issues.  Since last visit, she received Venofer 200 mg weekly x 4 ([facility]).  *** She reports feeling *** after IV iron. *** She continues to deny any obvious rectal bleeding, melena, epistaxis, or other source of blood loss. *** She denies any new lumps or bumps. *** She reports that she feels cold all the time.***  She denies any other B symptoms such as fever  or night sweats. *** She remains severely fatigued with dyspnea on exertion and palpitations.   ***No chest pain.   ***She has occasional dizziness and headaches.   ***She most recently received steroid injection via orthopedist on 08/21/2023 (methylprednisolone injection to right shoulder for scapulothoracic bursitis).   She has little to no*** energy and 50***% appetite.  Her weight remains low but overall stable for the past year.***  ASSESSMENT & PLAN:  1.  Normocytic anemia - (anemia of chronic disease +/- iron deficiency) - History of significant iron deficiency with mild anemia (Hgb ranging from 11.0-12.0) - She was Hemoccult stool POSITIVE x 3. - Most recent EGD (06/18/2023): Normal esophagus.  Diffuse mildly congestive gastric mucosa.  Normal mucosa in duodenum.  Biopsies taken for  evaluation of celiac disease were NEGATIVE. - Most recent colonoscopy (06/18/2023): Decreased sphincter tone.  Otherwise normal colon. - Capsule endoscopy (02/08/2021): Complete capsule endoscopy with no findings to explain patient's symptoms - CT enterography abdomen/pelvis (01/04/2021): No abnormal findings to explain chronic iron deficiency   *** RESUME PREP BELOW ***    - Anemia workup summarized below:  08/03/2023 - negative for hemolysis (normal haptoglobin, bilirubin, LDH, DAT) ***   ***We reviewed her prior anemia work-up which showed normal B12, folic acid and methylmalonic acid levels.  Copper levels were normal.  SPEP was negative. - Most recent IV iron with Venofer 200 mg x 4 in November 2024 - No bright red blood per rectum or melena. ***  - She has severe chronic fatigue, which is multifactorial*** - Most recent labs (07/18/2023): Hgb 8.1/MCV 95.2, WBC 15.3, platelets 446 (previous MPN testing was negative) Ferritin 165, iron saturation 11% with low TIBC 238 and low serum iron 25 Elevated ESR at 82 (07/04/2023) (Previous labs from May 2024 showed normal B12 628, normal MMA, normal folate, normal copper, normal vitamin D, creatinine) - Differential diagnosis favors anemia of chronic disease with functional iron deficiency, occult GI blood loss, and likely malabsorption secondary to chronic inflammation. -- She started Plaquenil in June 2024 for her rheumatoid arthritis - PLAN: Unclear etiology of drop in hemoglobin, apart from her functional iron deficiency and chronic disease.   - Recommend weekly Venofer 200 mg x 5 - Check weekly CBC/D and BB sample - Labs and RTC in 6 weeks - if no improvement in anemia despite IV iron, we will proceed  with additional workup, and consider bone marrow biopsy if no other cause of anemia is apparent   2.  Leukocytosis & thrombocytosis - Past CBCs reveal intermittent leukocytosis (neutrophil predominant) and thrombocytosis  - MPN work-up was  negative for JAK2 V617F, JAK2 exon 12, MPL and CALR.  Reflex testing to NGS did not reveal any abnormal mutations. - BCR/ABL FISH was negative - Inflammatory markers have been elevated, most recently with ESR 82 in October 2024 - Findings are consistent with chronic inflammation and reactive leukocytosis/thrombocytosis - No indication for bone marrow biopsy or further work-up at this time - Most recent CBC (07/18/2023) with WBC 15.3/ANC 12.0 and platelets 446 (NOTE: Received steroid injection via orthopedist on 07/06/2023) - PLAN: Most likely represents reactive leukocytosis/thrombocytosis.  We will continue active surveillance and consider additional workup if significant deviation from baseline.   3.  Weight loss and malnutrition - Not addressed during telephone visit (see last office visit from 04/17/2023)*** - Patient has had intermittent episodes of weight loss since 2020 - She follows with her PCP, GI, rheumatology, and endocrinology for severe IBS and weight loss. - She is up-to-date on routine cancer screening such as colonoscopy and mammogram. - She does not have any clear focal symptoms that would require imaging at this time. - Due to strong family history of malignancy, she was previously recommended for [facility] testing, but this was not covered by insurance and was cost prohibitive. - She has had extensive previous imaging including PET scan in October 2021, all of which has been negative for malignancy - Weight loss suspected to be due to her underlying inflammatory condition - She is taking Marinol as prescribed by PCP - Weight today is overall stable within the same 5 pound range for the past year - PLAN: Weight loss and malnutrition is being managed by her PCP.   4.  PET positive right axillary lymph node - PET scan obtained 07/09/2020 due to unintentional weight loss showed mildly hypermetabolic right axillary lymph node - Right axillary lymph node was palpable on exam (November  2021) - She has completed 5 days of azithromycin as she had a history of cat bite. - She was advised by Dr. Leonides Schanz that her lymph node was likely reactive and related to her underlying inflammatory disease, but patient insisted on biopsy. - Patient opted for right axillary lymph node biopsy (10/12/2020), which showed benign reactive lymph node, no malignancy   5.  PET positive ileocecal lesion - PET scan obtained 07/09/2020 for unintentional weight loss showed accentuated activity along the distalmost terminal ileum and ileocecal valve, possibly physiologic since there was no mass or specific abnormality in this region on diagnostic CT as of 06/23/2020. - Patient had colonoscopy on 08/12/2020, in which the cecum and IC valvle appeared normal. Terminal ileum could not be intubated due to restricted sigmoid and redundant colon. - CT abdomen/pelvis with contrast (01/04/2021): No abnormal bowel wall thickening or abnormal mural enhancement in the stomach, small bowel, or colon.  No findings to suggest inflammatory or chronic fibrous small bowel stricture.  No mesenteric or intraperitoneal free fluid.  Terminal ileum was normal, no edema or inflammation in the region of the cecum.   6.  Osteoporosis: - DEXA scan on 04/13/2020 shows T score -4.2. - She follows with endocrinology and orthopedics   7.  Rheumatoid arthritis - She follows with Dr. Dierdre Forth / Azucena Fallen of [facility]   PLAN SUMMARY:  >> Weekly CBC + BB sample >> Venofer 200 mg  once a week x 5 weeks >> Full lab panel in 5 weeks (labs TBD based on CBC trend) >> Same-day labs (CBC/BB sample only) + OFFICE visit in 6 weeks     REVIEW OF SYSTEMS: ***  Review of Systems - Oncology   PHYSICAL EXAM:  ECOG PERFORMANCE STATUS: {CHL ONC ECOG ZO:[id number]} *** There were no vitals filed for this visit. There were no vitals filed for this visit. Physical Exam  PAST MEDICAL/SURGICAL HISTORY:  Past Medical History:  Diagnosis  Date   Anemia    Anxiety    Arthritis    RA   Bursitis    Cataract    CHF (congestive heart failure) (HCC)    Collagen vascular disease (HCC)    Depression    Endometriosis    Heart murmur    High cholesterol    Hypertension    IBS (irritable bowel syndrome)    Lymphadenopathy    Mitral valve prolapse    Osteoporosis    RA (rheumatoid arthritis) (HCC)    Rotator cuff tear    bilateral   Stroke (HCC) 06/2022   Uterus, adenomyosis    Past Surgical History:  Procedure Laterality Date   AXILLARY LYMPH NODE BIOPSY Right 10/12/2020   Procedure: RIGHT AXILLARY LYMPH NODE BIOPSY EXCISION;  Surgeon: Almond Lint, MD;  Location: [facility];  Service: General;  Laterality: Right;   BUNIONECTOMY Right    CARPAL TUNNEL RELEASE Right    COLONOSCOPY     COLONOSCOPY N/A 12/13/2015   Procedure: COLONOSCOPY;  Surgeon: Malissa Hippo, MD;  [facility];  Service: Endoscopy;  Laterality: N/A;  9:55   Fatty tumor     2011 (left arm)   TONSILLECTOMY     TOTAL ABDOMINAL HYSTERECTOMY     2003    SOCIAL HISTORY:  Social History   Socioeconomic History   Marital status: Single    Spouse name: Not on file   Number of children: Not on file   Years of education: Not on file   Highest education level: Not on file  Occupational History   Occupation: retired  Tobacco Use   Smoking status: Some Days    Current packs/day: 0.00    Average packs/day: 0.5 packs/day for 33.0 years (16.5 ttl pk-yrs)    Types: Cigarettes    Start date: 22    Last attempt to quit: 2013    Years since quitting: 11.9   Smokeless tobacco: Never  Vaping Use   Vaping status: Never Used  Substance and Sexual Activity   Alcohol use: No    Alcohol/week: 0.0 standard drinks of alcohol   Drug use: No   Sexual activity: Not Currently  Other Topics Concern   Not on file  Social History Narrative   Not on file   Social Determinants of Health   Financial Resource Strain: Low Risk   (11/14/2022)   Received from [facility], [facility]   Overall Financial Resource Strain (CARDIA)    Difficulty of Paying Living Expenses: Not hard at all  Food Insecurity: No Food Insecurity (11/14/2022)   Received from [facility], [facility]   Hunger Vital Sign    Worried About Running Out of Food in the Last Year: Never true    Ran Out of Food in the Last Year: Never true  Transportation Needs: No Transportation Needs (11/14/2022)   Received from [facility], [facility]   [facility] - Transportation    Lack of Transportation (Medical): No  Lack of Transportation (Non-Medical): No  Physical Activity: Unknown (11/14/2022)   Received from [facility], [facility]   Exercise Vital Sign    Days of Exercise per Week: 0 days    Minutes of Exercise per Session: Not on file  Stress: Stress Concern Present (11/14/2022)   Received from [facility], [facility] - Occupational Stress Questionnaire    Feeling of Stress : Very much  Social Connections: Somewhat Isolated (11/14/2022)   Received from [facility], [facility]   Social Network    How would you rate your social network (family, work, friends)?: Restricted participation with some degree of social isolation  Intimate Partner Violence: Not At Risk (11/14/2022)   Received from [facility], [facility]   HITS    Over the last 12 months how often did your partner physically hurt you?: Never    Over the last 12 months how often did your partner insult you or talk down to you?: Never    Over the last 12 months how often did your partner threaten you with physical harm?: Never    Over the last 12 months how often did your partner scream or curse at you?: Never    FAMILY HISTORY:  Family History  Problem Relation Age of Onset   Bone cancer Mother    Brain cancer Father    Brain cancer Sister    Breast cancer Sister    Renal Disease Sister    Pancreatic cancer Neg Hx     Esophageal cancer Neg Hx    Stomach cancer Neg Hx    Rectal cancer Neg Hx    Colon polyps Neg Hx     CURRENT MEDICATIONS:  Outpatient Encounter Medications as of 09/04/2023  Medication Sig   acetaminophen (TYLENOL) 500 MG tablet Take 500 mg by mouth every 6 (six) hours as needed.   acetaminophen-codeine (TYLENOL #3) 300-30 MG tablet Take 1 tablet by mouth daily as needed for moderate pain (pain score 4-6).   Ascorbic Acid (VITAMIN C PO) Take by mouth.   ASPIRIN 81 PO Take 1 tablet by mouth daily.   atorvastatin (LIPITOR) 20 MG tablet Take 1 tablet (20 mg total) by mouth daily.   BD SYRINGE SLIP TIP 25G X 5/8" 1 ML MISC USE AS DIRECTED FOR ONCE MONTHLY B12 INJECTIONS   Cholecalciferol (VITAMIN D3) 25 MCG (1000 UT) CAPS 1 capsule Orally Once a day for 30 day(s)   cyanocobalamin (,VITAMIN B-12,) 1000 MCG/ML injection Inject 1,000 mcg into the skin every 30 (thirty) days.   cyclobenzaprine (FLEXERIL) 5 MG tablet TAKE 1 TABLET(5 MG) BY MOUTH AT BEDTIME AS NEEDED FOR MUSCLE SPASMS   diclofenac Sodium (VOLTAREN) 1 % GEL SMARTSIG:Gram(s) Topical 4 Times Daily PRN   dicyclomine (BENTYL) 10 MG capsule Take 1 capsule (10 mg total) by mouth 4 (four) times daily as needed for spasms.   docusate sodium (COLACE) 100 MG capsule TAKE 1 CAPSULE(100 MG) BY MOUTH TWICE DAILY   DULoxetine (CYMBALTA) 20 MG capsule Take 20 mg by mouth 2 (two) times daily.   fentaNYL (DURAGESIC) 50 MCG/HR Place onto the skin every 3 (three) days. Patient states she does it every other day.   folic acid (FOLVITE) 1 MG tablet Take 1 mg by mouth daily.   furosemide (LASIX) 20 MG tablet Take 20 mg by mouth daily as needed.   Heat Wraps (THERMACARE BACK/HIP) MISC 1 application by Does not apply route daily.   hydroxychloroquine (PLAQUENIL)  200 MG tablet TAKE 1/2 TABLET BY MOUTH DAILY   hydrOXYzine (ATARAX) 25 MG tablet Take 25 mg by mouth 2 (two) times daily as needed for itching.   hydrOXYzine (ATARAX) 50 MG tablet Take by mouth.    Insulin Pen Needle (SURE COMFORT PEN NEEDLES) 30G X 8 MM MISC Use as directed (Patient taking differently: See admin instructions. Patient states she uses these for Forteo injection.)   lidocaine (LIDODERM) 5 % one patch daily.   lipase/protease/amylase (CREON) 12000-38000 units CPEP capsule Use 1 capsule with meals and snacks   LORazepam (ATIVAN) 0.5 MG tablet TAKE 1/2 TO 1 TABLET BY MOUTH AT BEDTIME AS NEEDED   magnesium oxide (MAG-OX) 400 (240 Mg) MG tablet Take 1 tablet by mouth daily.   megestrol (MEGACE) 40 MG tablet Take 1 tablet (40 mg total) by mouth daily.   metoprolol tartrate (LOPRESSOR) 25 MG tablet    MYRBETRIQ 25 MG TB24 tablet Take 25 mg by mouth daily.   Na Sulfate-K Sulfate-Mg Sulf 17.5-3.13-1.6 GM/177ML SOLN Take 1 kit by mouth as directed.   naloxone (NARCAN) nasal spray 4 mg/0.1 mL    naratriptan (AMERGE) 2.5 MG tablet Take 1 tablet (2.5 mg total) by mouth as needed for migraine. Take one (1) tablet at onset of headache; if returns or does not resolve, may repeat after 4 hours; do not exceed five (5) mg in 24 hours.   ondansetron (ZOFRAN-ODT) 4 MG disintegrating tablet Take 4 mg by mouth every 8 (eight) hours as needed.   pantoprazole (PROTONIX) 40 MG tablet TAKE 1 TABLET(40 MG) BY MOUTH TWICE DAILY   PARoxetine (PAXIL) 20 MG tablet Take 20 mg by mouth daily.   pregabalin (LYRICA) 25 MG capsule Take 1 capsule (25 mg total) by mouth 2 (two) times daily.   promethazine (PHENERGAN) 25 MG tablet Take 0.5-1 tablets (12.5-25 mg total) by mouth every 6 (six) hours as needed for nausea or vomiting.   Syringe/Needle, Disp, (SYRINGE 3CC/23GX1") 23G X 1" 3 ML MISC by Does not apply route.   traZODone (DESYREL) 100 MG tablet Take 1 tablet (100 mg total) by mouth at bedtime as needed for sleep.   Zoledronic Acid (RECLAST IV) Inject 5 mg into the vein. Once yearly   No facility-administered encounter medications on file as of 09/04/2023.    ALLERGIES:  Allergies  Allergen Reactions    Abatacept Other (See Comments)   Codeine Other (See Comments)    Stomach pains/constipation- due to IBS   Golimumab Other (See Comments)   Hydrocodone Other (See Comments)    She does not want to take this due to intolerance to codeine.   Methylprednisolone Diarrhea and Other (See Comments)    "Made my IBS flare up"  Diarrhea, nausea   Methotrexate Derivatives Other (See Comments)    Agitation; pt stated, "I get no sleep; one dose caused no sleep for 7 days and 7 nights - that was when I got a 0.5 injection"   Prednisone Rash    Cannot take prednisone by mouth ---- red rash all over  Patient states she got a rash one time     LABORATORY DATA:  I have reviewed the labs as listed.  CBC    Component Value Date/Time   WBC 12.5 (H) 08/29/2023 1415   RBC 3.23 (L) 08/29/2023 1415   HGB 9.3 (L) 08/29/2023 1415   HGB 11.5 10/07/2020 1321   HCT 28.6 (L) 08/29/2023 1415   HCT 34.0 10/07/2020 1321   PLT 511 (H)  08/29/2023 1415   PLT 462 (H) 10/07/2020 1321   MCV 88.5 08/29/2023 1415   MCV 91 10/07/2020 1321   MCH 28.8 08/29/2023 1415   MCHC 32.5 08/29/2023 1415   RDW 15.6 (H) 08/29/2023 1415   RDW 13.7 10/07/2020 1321   LYMPHSABS 0.8 08/03/2023 1347   LYMPHSABS 2.9 10/07/2020 1321   MONOABS 0.6 08/03/2023 1347   EOSABS 75 08/29/2023 1415   EOSABS 0.1 10/07/2020 1321   BASOSABS 75 08/29/2023 1415   BASOSABS 0.1 10/07/2020 1321      Latest Ref Rng & Units 08/03/2023    1:47 PM 06/14/2023    1:31 PM 05/21/2023    9:30 AM  CMP  Glucose 70 - 99 mg/dL  657  94   BUN 8 - 23 mg/dL  10  14   Creatinine 8.46 - 1.00 mg/dL  9.62  9.52   Sodium 841 - 145 mmol/L  139  135   Potassium 3.5 - 5.1 mmol/L  3.5  3.5   Chloride 98 - 111 mmol/L  102  101   CO2 22 - 32 mmol/L  26  26   Calcium 8.9 - 10.3 mg/dL  9.1  8.8   Total Protein 6.5 - 8.1 g/dL  7.5  7.3   Total Bilirubin 0.3 - 1.2 mg/dL 0.4  0.3  0.5   Alkaline Phos 38 - 126 U/L  142  110   AST 15 - 41 U/L  28  18   ALT 0 - 44 U/L   13  10     DIAGNOSTIC IMAGING:  I have independently reviewed the relevant imaging and discussed with the patient.   WRAP UP:  All questions were answered. The patient knows to call the clinic with any problems, questions or concerns.  Medical decision making: ***  Time spent on visit: I spent *** minutes counseling the patient face to face. The total time spent in the appointment was *** minutes and more than 50% was on counseling.  Carnella Guadalajara, PA-C  ***

## 2023-09-05 ENCOUNTER — Encounter: Payer: Medicare HMO | Attending: Gastroenterology | Admitting: Emergency Medicine

## 2023-09-05 ENCOUNTER — Other Ambulatory Visit: Payer: Self-pay | Admitting: Emergency Medicine

## 2023-09-05 VITALS — BP 129/57 | HR 97 | Temp 97.9°F | Resp 16

## 2023-09-05 DIAGNOSIS — D500 Iron deficiency anemia secondary to blood loss (chronic): Secondary | ICD-10-CM | POA: Insufficient documentation

## 2023-09-05 DIAGNOSIS — D509 Iron deficiency anemia, unspecified: Secondary | ICD-10-CM

## 2023-09-05 LAB — CBC WITH DIFFERENTIAL/PLATELET
Absolute Lymphocytes: 1369 {cells}/uL (ref 850–3900)
Absolute Monocytes: 637 {cells}/uL (ref 200–950)
Basophils Absolute: 71 {cells}/uL (ref 0–200)
Basophils Relative: 0.6 %
Eosinophils Absolute: 94 {cells}/uL (ref 15–500)
Eosinophils Relative: 0.8 %
HCT: 31 % — ABNORMAL LOW (ref 35.0–45.0)
Hemoglobin: 9.8 g/dL — ABNORMAL LOW (ref 11.7–15.5)
MCH: 28 pg (ref 27.0–33.0)
MCHC: 31.6 g/dL — ABNORMAL LOW (ref 32.0–36.0)
MCV: 88.6 fL (ref 80.0–100.0)
MPV: 9.6 fL (ref 7.5–12.5)
Monocytes Relative: 5.4 %
Neutro Abs: 9629 {cells}/uL — ABNORMAL HIGH (ref 1500–7800)
Neutrophils Relative %: 81.6 %
Platelets: 524 10*3/uL — ABNORMAL HIGH (ref 140–400)
RBC: 3.5 10*6/uL — ABNORMAL LOW (ref 3.80–5.10)
RDW: 15.3 % — ABNORMAL HIGH (ref 11.0–15.0)
Total Lymphocyte: 11.6 %
WBC: 11.8 10*3/uL — ABNORMAL HIGH (ref 3.8–10.8)

## 2023-09-05 MED ORDER — ACETAMINOPHEN 325 MG PO TABS: 650.0000 mg | ORAL_TABLET | Freq: Once | ORAL | Status: DC

## 2023-09-05 MED ORDER — DIPHENHYDRAMINE HCL 25 MG PO CAPS: 25.0000 mg | ORAL_CAPSULE | Freq: Once | ORAL | Status: DC

## 2023-09-05 MED ORDER — SODIUM CHLORIDE 0.9 % IV SOLN
200.0000 mg | Freq: Once | INTRAVENOUS | Status: AC
  Administered 2023-09-05: 200 mg via INTRAVENOUS
  Filled 2023-09-05 (×2): qty 10

## 2023-09-05 NOTE — Progress Notes (Signed)
Diagnosis: Iron Deficiency Anemia  Provider:    Marice Potter, MD   Procedure: IV Infusion  IV Type: Peripheral, IV Location: L Antecubital  Venofer (Iron Sucrose), Dose: 200 mg  Infusion Start Time: 1312  Infusion Stop Time: 1329  Post Infusion IV Care: Patient declined observation and Peripheral IV Discontinued  Discharge: Condition: Good, Destination: Home . AVS Provided  Performed by:  Arrie Senate, RN

## 2023-09-06 ENCOUNTER — Telehealth: Payer: Self-pay | Admitting: Sports Medicine

## 2023-09-06 ENCOUNTER — Ambulatory Visit: Payer: Medicare HMO | Admitting: Sports Medicine

## 2023-09-06 ENCOUNTER — Encounter: Payer: Self-pay | Admitting: Sports Medicine

## 2023-09-06 DIAGNOSIS — R627 Adult failure to thrive: Secondary | ICD-10-CM

## 2023-09-06 DIAGNOSIS — M25512 Pain in left shoulder: Secondary | ICD-10-CM

## 2023-09-06 DIAGNOSIS — M0579 Rheumatoid arthritis with rheumatoid factor of multiple sites without organ or systems involvement: Secondary | ICD-10-CM

## 2023-09-06 DIAGNOSIS — G8929 Other chronic pain: Secondary | ICD-10-CM

## 2023-09-06 DIAGNOSIS — M12812 Other specific arthropathies, not elsewhere classified, left shoulder: Secondary | ICD-10-CM

## 2023-09-06 DIAGNOSIS — M7552 Bursitis of left shoulder: Secondary | ICD-10-CM | POA: Diagnosis not present

## 2023-09-06 MED ORDER — BUPIVACAINE HCL 0.25 % IJ SOLN
1.0000 mL | INTRAMUSCULAR | Status: AC | PRN
  Administered 2023-09-06: 1 mL via INTRA_ARTICULAR

## 2023-09-06 MED ORDER — LIDOCAINE HCL 1 % IJ SOLN
1.0000 mL | INTRAMUSCULAR | Status: AC | PRN
  Administered 2023-09-06: 1 mL

## 2023-09-06 MED ORDER — METHYLPREDNISOLONE ACETATE 40 MG/ML IJ SUSP
40.0000 mg | INTRAMUSCULAR | Status: AC | PRN
  Administered 2023-09-06: 40 mg via INTRA_ARTICULAR

## 2023-09-06 NOTE — Progress Notes (Signed)
UMU SCHILLO - 67 y.o. female MRN [id number]  Date of birth: Feb 09, 1956  Office Visit Note: Visit Date: 09/06/2023 PCP: [name], NP Referred by: [name], NP  Subjective: Chief Complaint  Patient presents with   Left Shoulder - Pain   HPI: Vanessa Lara is a pleasant 67 y.o. female who presents today for acute on chronic left shoulder and scapular pain.  Adamari reports she is in significant pain in the posterior aspect of the left shoulder blade as well as the left shoulder that radiates down the deltoid.  In the past we had done a scapulothoracic injection on the contralateral side and since then she states her pain has been much better from the standpoint.  Does have a history of rheumatoid arthritis, managed on Plaquenil 200 mg daily. She also has a history of diminished bone density, history of Fosamax use.  She has continued to lose some weight. She has been dealing with lots of pain. She is on Fentanyl 50 mcg/hr patch through a hospice care team, but feels like she needs to increase dose as not managing appropriately.  Pertinent ROS were reviewed with the patient and found to be negative unless otherwise specified above in HPI.   Assessment & Plan: Visit Diagnoses:  1. Chronic left shoulder pain   2. Scapulothoracic bursitis of left shoulder   3. Rotator cuff arthropathy of left shoulder   4. Failure to thrive in adult   5. Rheumatoid arthritis involving multiple sites with positive rheumatoid factor (HCC)   6. Trigger point of left shoulder region    Plan: Vanessa Lara is dealing with an acute exacerbation of her chronic left shoulder pain which is emanating from her rotator cuff arthropathy as well as associated scapulothoracic bursitis and trigger point over the scapular region.  We did proceed with both left shoulder subacromial joint injection as well as left scapulothoracic shoulder injection, which she tolerated well.  She has a lot of overall joint and body  pain which is likely a combination of her rheumatoid arthritis and FTT with progressive weight loss and associated systemic distress. She is on Fentanyl 50 mcg/hr patch through a hospice care team for her chronic pain, I will fax the note there to see if they would like to adjust her dose, but they are hesitant given her low BMI.  Will ultimately leave this up to them and further medical management.  She may use Flexeril 5 mg twice daily as needed for associated muscle pain or spasms.  She has follow-up with Karenann Cai next month for reevaluation of the shoulder.  Meds & Orders: No orders of the defined types were placed in this encounter.   Orders Placed This Encounter  Procedures   Large Joint Inj: L glenohumeral   Large Joint Inj: L subacromial bursa     Procedures: Large Joint Inj: L glenohumeral on 09/06/2023 2:52 PM Indications: pain Details: 25 G 1.5 in needle, posterior approach Medications: 1 mL lidocaine 1 %; 1 mL bupivacaine 0.25 %; 40 mg methylPREDNISolone acetate 40 MG/ML Outcome: tolerated well, no immediate complications  Procedure: Left-shoulder scapulothoracic bursa injection - technically successful injection. Patient in-prone position with needle from medial to lateral direction over inferior border of scapula. Procedure, treatment alternatives, risks and benefits explained, specific risks discussed. Consent was given by the patient. Immediately prior to procedure a time out was called to verify the correct patient, procedure, equipment, support staff and site/side marked as required. Patient was prepped and draped in  the usual sterile fashion.    Large Joint Inj: L subacromial bursa on 09/06/2023 2:54 PM Indications: pain Details: 22 G 1.5 in needle, posterior approach Medications: 1 mL lidocaine 1 %; 1 mL bupivacaine 0.25 %; 40 mg methylPREDNISolone acetate 40 MG/ML Outcome: tolerated well, no immediate complications  Subacromial Joint Injection, Left Shoulder After  discussion on risks/benefits/indications, informed verbal consent was obtained. A timeout was then performed. Patient was seated on table in exam room. The patient's shoulder was prepped with betadine and alcohol swabs and utilizing posterior approach a 22G, 1.5" needle was directed anteriorly and laterally into the patient's subacromial space was injected with 1:1:1 mixture of lidocaine:bupivicaine:depomedrol with appreciation of free-flowing of the injectate into the bursal space. Patient tolerated the procedure well without immediate complications.   Procedure, treatment alternatives, risks and benefits explained, specific risks discussed. Consent was given by the patient. Immediately prior to procedure a time out was called to verify the correct patient, procedure, equipment, support staff and site/side marked as required. Patient was prepped and draped in the usual sterile fashion.          Clinical History:   She reports that she has been smoking cigarettes. She started smoking about 44 years ago. She has a 16.5 pack-year smoking history. She has never used smokeless tobacco.  Recent Labs    11/02/22 1122  HGBA1C 5.1    Objective:    Physical Exam  Gen: Cachetic-appearing, in no acute distress; non-toxic CV: Well-perfused. Warm.  Resp: Breathing unlabored on room air; no wheezing.  Ortho Exam - Left shoulder/scapula: Positive TTP at Codman's point.  There is pain with endrange abduction and pain and weakness with associated resisted abduction and external rotation.  No shoulder effusion.  There is tenderness over the medial aspect of the left scapular border and degree of crepitus taking the shoulder blade through range of motion. + TP over inferior-medial aspect of scapula.  Imaging: Narrative & Impression  CLINICAL DATA:  Left shoulder pain.   EXAM: MRI OF THE LEFT SHOULDER WITHOUT CONTRAST   TECHNIQUE: Multiplanar, multisequence MR imaging of the shoulder was  performed. No intravenous contrast was administered.   COMPARISON:  None.   FINDINGS: Rotator cuff: High-grade partial-thickness bursal surface tear of the anterior supraspinatus tendon. Severe tendinosis of the infraspinatus tendon. Teres minor tendon is intact. Subscapularis tendon is intact.   Muscles: No muscle atrophy or edema. No intramuscular fluid collection or hematoma.   Biceps Long Head: Intraarticular and extraarticular portions of the biceps tendon are intact.   Acromioclavicular Joint: Mild arthropathy of the acromioclavicular joint. Type I acromion. Small amount of subacromial/subdeltoid bursal fluid.   Glenohumeral Joint: No joint effusion. No chondral defect.   Labrum: Grossly intact, but evaluation is limited by lack of intraarticular fluid/contrast.   Bones: No fracture or dislocation. No aggressive osseous lesion.   Other: No fluid collection or hematoma.   IMPRESSION: 1. High-grade partial-thickness bursal surface tear of the anterior supraspinatus tendon. 2. Severe tendinosis of the infraspinatus tendon. 3. Mild subacromial/subdeltoid bursitis.     Electronically Signed   By: Elige Ko   On: 01/11/2021 14:09      Past Medical/Family/Surgical/Social History: Medications & Allergies reviewed per EMR, new medications updated. Patient Active Problem List   Diagnosis Date Noted   High risk medication use 01/16/2023   Medication monitoring encounter 12/05/2021   Physical deconditioning 11/11/2021   Elevated serum lactate dehydrogenase (LDH) 08/17/2021   Vitamin D deficiency 08/17/2021   Chronic fatigue  07/25/2021   Vitamin B12 deficiency 07/19/2021   Hardening of the aorta (main artery of the heart) (HCC) 06/21/2021   Solitary lung nodule 06/15/2021   Loss of weight 05/18/2021   Protein-calorie malnutrition, severe (HCC) 05/18/2021   Osteoporosis 06/15/2020   Iron deficiency anemia 04/09/2020   Primary osteoarthritis involving multiple  joints 12/31/2018   Rheumatoid arthritis involving multiple sites with positive rheumatoid factor (HCC) 12/31/2018   Irritable bowel syndrome with both constipation and diarrhea 10/28/2018   Constipation 03/14/2016   Mitral valve prolapse 11/23/2015   High cholesterol 11/23/2015   Past Medical History:  Diagnosis Date   Anemia    Anxiety    Arthritis    RA   Bursitis    Cataract    CHF (congestive heart failure) (HCC)    Collagen vascular disease (HCC)    Depression    Endometriosis    Heart murmur    High cholesterol    Hypertension    IBS (irritable bowel syndrome)    Lymphadenopathy    Mitral valve prolapse    Osteoporosis    RA (rheumatoid arthritis) (HCC)    Rotator cuff tear    bilateral   Stroke (HCC) 06/2022   Uterus, adenomyosis    Family History  Problem Relation Age of Onset   Bone cancer Mother    Brain cancer Father    Brain cancer Sister    Breast cancer Sister    Renal Disease Sister    Pancreatic cancer Neg Hx    Esophageal cancer Neg Hx    Stomach cancer Neg Hx    Rectal cancer Neg Hx    Colon polyps Neg Hx    Past Surgical History:  Procedure Laterality Date   AXILLARY LYMPH NODE BIOPSY Right 10/12/2020   Procedure: RIGHT AXILLARY LYMPH NODE BIOPSY EXCISION;  Surgeon: Almond Lint, MD;  Location: [facility];  Service: General;  Laterality: Right;   BUNIONECTOMY Right    CARPAL TUNNEL RELEASE Right    COLONOSCOPY     COLONOSCOPY N/A 12/13/2015   Procedure: COLONOSCOPY;  Surgeon: Malissa Hippo, MD;  [facility];  Service: Endoscopy;  Laterality: N/A;  9:55   Fatty tumor     2011 (left arm)   TONSILLECTOMY     TOTAL ABDOMINAL HYSTERECTOMY     2003   Social History   Occupational History   Occupation: retired  Tobacco Use   Smoking status: Some Days    Current packs/day: 0.00    Average packs/day: 0.5 packs/day for 33.0 years (16.5 ttl pk-yrs)    Types: Cigarettes    Start date: 71    Last attempt to  quit: 2013    Years since quitting: 11.9   Smokeless tobacco: Never  Vaping Use   Vaping status: Never Used  Substance and Sexual Activity   Alcohol use: No    Alcohol/week: 0.0 standard drinks of alcohol   Drug use: No   Sexual activity: Not Currently

## 2023-09-06 NOTE — Telephone Encounter (Signed)
Patient said that she needed to give [facility] her information. [facility]. And the number [phone]. And when you get the fax make the attention to rhonda lucas or [place]

## 2023-09-06 NOTE — Progress Notes (Signed)
Patient says that she is having the same pain that she was having in her right shoulder, on the left side. She says that this side feels worse than the right.

## 2023-09-17 ENCOUNTER — Ambulatory Visit: Payer: Medicare HMO | Admitting: Physician Assistant

## 2023-09-18 ENCOUNTER — Telehealth: Payer: Self-pay | Admitting: Sports Medicine

## 2023-09-18 NOTE — Telephone Encounter (Signed)
Pt called requesting a call from [place]. Pt states she would like another injection but no in the same area of the shoulder as before. Pt was unable to describe exactly where she need injection and asking for a call back. Please call pt at [phone].

## 2023-09-19 ENCOUNTER — Telehealth: Payer: Self-pay

## 2023-09-19 ENCOUNTER — Telehealth: Payer: Self-pay | Admitting: Physical Medicine and Rehabilitation

## 2023-09-19 NOTE — Telephone Encounter (Signed)
Pt called in she has seen several providers in the office but she is not stating she has pain in her back about 2-3 rib bones below the shoulder done and in the collar bone area also , pt was seeing Dr. Shon Baton for shoulder pain and recently got injections last month and they helped but the pain is now in a new area and she would like to see if someone could get her in this week to see about this new back pain she is having. Please advise pt cannot take narcotics being she has severe IBS

## 2023-09-19 NOTE — Telephone Encounter (Signed)
Patient contacted the office and states she she was diagnosed with bursitis of her shoulder blades and clavicles. Patient would like to know if increasing her hydroxychloroquine would help or if there is anything else she can do. Patient states she did have shots in these areas a few weeks ago and can not have more shots until February. Please advise.

## 2023-09-20 NOTE — Telephone Encounter (Signed)
Patient would like to know if prednisone might be an option as she can not get any more injections until March. Patient is using topical creams, heat, ice with very little relief. She states the pain is severe.

## 2023-09-27 NOTE — Progress Notes (Deleted)
 Office Visit Note  Patient: Vanessa Lara             Date of Birth: 03-17-1956           MRN: [id number]             PCP: [name], NP Referring: [name], NP Visit Date: 10/09/2023   Subjective:  No chief complaint on file.   History of Present Illness: Vanessa Lara is a 67 y.o. female here for follow up for seropositive RA and osteoarthritis and osteoporosis she stopped hydroxychloroquine after last visit due to concern about cardiac arrhythmia alongside Paxil and duloxetine.    Previous HPI 07/04/2023 Vanessa Lara is a 67 y.o. female here for follow up for seropositive RA and osteoarthritis and osteoporosis she stopped hydroxychloroquine after last visit due to concern about cardiac arrhythmia alongside Paxil and duloxetine.  She was only able to take a low-dose of the medication she did feel it improved her overall condition but still had persistent arthritis of numerous areas regardless.  She had an updated bone density scan on July 15 still with osteoporosis of the lumbar spine although better than 2022 and with femur osteopenia similar to the previous test.  She is taking Venofer for iron deficiency anemia this ongoing to correct it with her goal of enough to tolerate foot surgery.  She underwent endoscopy with no upper or lower GI findings to account for heme positive stool. Acute problem today increased right ankle pain and swelling started since 3 weeks ago.  She did not recall any fall trip or other trauma to that area.  Initially there was a high degree of swelling he put a wrap around this and can see prominent indentation the swelling has largely decreased compared to the outset but still has pain with direct pressure and with weightbearing.   Imaging reviewed DEXA Review [facility] L1-L2 04/16/23  -3.8    0.712 g/cm2 04/12/21  -4.5    0.627 g/cm2 04/13/20  -4.2    0.665 g/cm2   DualFemur Total Mean 04/16/23  -2.6    0.680 g/cm2 04/12/21  -2.6    0.676  g/cm2 04/13/20  -2.2    0.736 g/cm2     03/08/23 Vanessa Lara is a 67 y.o. female here for follow up for seropositive RA.  Currently off any disease specific medication she cannot tolerate taking the sulfasalazine due to severe stomach irritation described as burning up her stomach.  Continues having joint pain and stiffness on a daily basis overall feels her function is very poor due to the severity.  Had labs last month still anemic at hemoglobin of 9.8 being treated with infusion for iron deficiency anemia.   01/16/23 Vanessa Lara is a 67 y.o. female here for follow up for seropositive RA.  Currently health maintenance treatment after discontinuing 5 mg prednisone is on medication for joint pain for RA along with osteoarthritis and chronic pain syndrome.  Had recent knee steroid injection since her last visit but reports overall widespread symptoms are severe.  Particularly is been noticing some increased trouble with pain and some swelling involving the wrist worse on left side.   10/03/22 Vanessa Lara is a 67 y.o. female here for follow up for seropositive RA currently off long term DMARD with prednisone 5 mg daily as needed.  She is overall not doing well feels like pain and stiffness is a big problem but more severe is her fatigue and feels  a generalized getting weaker with difficulty moving and with her upper body strength.  She pretty consistently has to take the prednisone for otherwise does not move around much during the day.  Continues to get a benefit with the Flexeril at nighttime with slight decrease in frequency of awakenings.  She is also concerned about getting an update to her bone density testing ongoing oral alendronate treatment.  She is also still having a lot of pain and difficulty with walking from her claw toe deformities on the foot and planning to follow-up with surgery clinic about management of this.   06/28/22 Vanessa Lara is a 67 y.o. female here for follow up for  seropositive RA now off of treatment after stopping the [place].  Since she stopped taking the medication her complaint of night sweats and fatigue have improved. She has seen a worsening of joint pain in several areas particularly around the MCP joints of both hands. For knee pain she saw orthopedic surgery clinic yesterday and had injections on both sides.  She also discussed getting set up to do right SI joint steroid injection.  Biggest complaint today is back pain on the right side around the middle of the back and around the scapula.  This bothers her especially with certain movements and trying to bend side to side.    05/18/2022  Vanessa Lara is a 67 y.o. female here for follow up for seropositive rheumatoid arthritis on Kevzara 150 mg [place] q14days. She feels arthritis has been fairly well controlled but has pain worse in her left foot mostly. She had GI issues suspected UTI but with negative culture since last visit. She feels a sensation "like her brain is numb." Also has ongoing upper neck pain without radiating symptoms.    02/07/2022 Vanessa Lara is a 67 y.o. female here for follow up for rheumatoid arthritis with chronic joint pain in multiple areas and chronic fatigue labs showing elevated rheumatoid factor. Kevzara new start on 12/15/2021. She felt an improvement when taking the injections within first weeks. Accidentally took the shots twice in a week due to forgetfulness or mistake but did not notice any major problems. She called in on account of increased joint pain but felt no improvement with the prescribed medrol dose pack. She did feel more lightheadedness or off balance feeling while taking this so stopped after about 3 days. Currently back pain and her left foot pain are worst issues.   Previous HPI 12/05/2021 Vanessa Lara is a 67 y.o. female here for evaluation of rheumatoid arthritis with chronic joint pain in multiple areas and chronic fatigue labs showing elevated rheumatoid  factor. She has history of mitral valve prolapse, severe osteoporosis, osteoarthritis, and severe weight loss and deconditioning. She was diagnosed with seropositive RA in 2018 with Dr. Deanne Coffer and most recently saw Dr. Dierdre Forth in 11/2020. At that time recommended to start hydroxychloroquine for inflammatory arthritis symptoms. She previously tried methotrexate, simponi aria, and orencia with poor tolerance. Imaging consistent with degenerative arthritis in cervical spine and knee but also with some partial tendon tear in shoulder and some synovitis present in knee on MRI imaging. She has taken multiple medications for pain including NSAIDs, prednisone, gabapentin, tramadol, and hydrocodone or oxycodone. She has a lot of pain in multiple areas but is also severely fatigued. She has significant weight loss down to 70 lbs unintentionally reports chronic IBS issues but no recent changes during this period of weight loss. Joint pain also limits her  activity quite a bit with chronic deformities and also ongoing swelling in bilateral hands. She has also developed left sided headache localized above the temporal area. She has a lot of pain at the base of the skull and occiput without clear radiation of symptoms. She is scheduled to see Dr. Delena Bali for headache evaluation and management 3/28.   DMARD Hx HCQ SSZ MTX Simponi Orencia Kevzara   05/2017 RF 15 CCP 32 HBV neg HCV neg   No Rheumatology ROS completed.   PMFS History:  Patient Active Problem List   Diagnosis Date Noted   High risk medication use 01/16/2023   Medication monitoring encounter 12/05/2021   Physical deconditioning 11/11/2021   Elevated serum lactate dehydrogenase (LDH) 08/17/2021   Vitamin D deficiency 08/17/2021   Chronic fatigue 07/25/2021   Vitamin B12 deficiency 07/19/2021   Hardening of the aorta (main artery of the heart) (HCC) 06/21/2021   Solitary lung nodule 06/15/2021   Loss of weight 05/18/2021   Protein-calorie  malnutrition, severe (HCC) 05/18/2021   Osteoporosis 06/15/2020   Iron deficiency anemia 04/09/2020   Primary osteoarthritis involving multiple joints 12/31/2018   Rheumatoid arthritis involving multiple sites with positive rheumatoid factor (HCC) 12/31/2018   Irritable bowel syndrome with both constipation and diarrhea 10/28/2018   Constipation 03/14/2016   Mitral valve prolapse 11/23/2015   High cholesterol 11/23/2015    Past Medical History:  Diagnosis Date   Anemia    Anxiety    Arthritis    RA   Bursitis    Cataract    CHF (congestive heart failure) (HCC)    Collagen vascular disease (HCC)    Depression    Endometriosis    Heart murmur    High cholesterol    Hypertension    IBS (irritable bowel syndrome)    Lymphadenopathy    Mitral valve prolapse    Osteoporosis    RA (rheumatoid arthritis) (HCC)    Rotator cuff tear    bilateral   Stroke (HCC) 06/2022   Uterus, adenomyosis     Family History  Problem Relation Age of Onset   Bone cancer Mother    Brain cancer Father    Brain cancer Sister    Breast cancer Sister    Renal Disease Sister    Pancreatic cancer Neg Hx    Esophageal cancer Neg Hx    Stomach cancer Neg Hx    Rectal cancer Neg Hx    Colon polyps Neg Hx    Past Surgical History:  Procedure Laterality Date   AXILLARY LYMPH NODE BIOPSY Right 10/12/2020   Procedure: RIGHT AXILLARY LYMPH NODE BIOPSY EXCISION;  Surgeon: Almond Lint, MD;  Location: [facility];  Service: General;  Laterality: Right;   BUNIONECTOMY Right    CARPAL TUNNEL RELEASE Right    COLONOSCOPY     COLONOSCOPY N/A 12/13/2015   Procedure: COLONOSCOPY;  Surgeon: Malissa Hippo, MD;  [facility];  Service: Endoscopy;  Laterality: N/A;  9:55   Fatty tumor     2011 (left arm)   TONSILLECTOMY     TOTAL ABDOMINAL HYSTERECTOMY     2003   Social History   Social History Narrative   Not on file   Immunization History  Administered Date(s) Administered    Influenza, Quadrivalent, Recombinant, Inj, Pf 07/11/2017     Objective: Vital Signs: There were no vitals taken for this visit.   Physical Exam   Musculoskeletal Exam: ***  CDAI Exam: CDAI Score: -- Patient  Global: --; Provider Global: -- Swollen: --; Tender: -- Joint Exam 10/09/2023   No joint exam has been documented for this visit   There is currently no information documented on the homunculus. Go to the Rheumatology activity and complete the homunculus joint exam.  Investigation: No additional findings.  Imaging: No results found.  Recent Labs: Lab Results  Component Value Date   WBC 11.8 (H) 09/05/2023   HGB 9.8 (L) 09/05/2023   PLT 524 (H) 09/05/2023   NA 139 06/14/2023   K 3.5 06/14/2023   CL 102 06/14/2023   CO2 26 06/14/2023   GLUCOSE 121 (H) 06/14/2023   BUN 10 06/14/2023   CREATININE 0.70 06/14/2023   BILITOT 0.4 08/03/2023   ALKPHOS 142 (H) 06/14/2023   AST 28 06/14/2023   ALT 13 06/14/2023   PROT 7.5 06/14/2023   ALBUMIN 3.6 06/14/2023   CALCIUM 9.1 06/14/2023   GFRAA 92 11/06/2019   QFTBGOLDPLUS NEGATIVE 12/05/2021    Speciality Comments: No specialty comments available.  Procedures:  No procedures performed Allergies: Abatacept, Codeine, Golimumab, Hydrocodone, Methylprednisolone, Methotrexate derivatives, and Prednisone   Assessment / Plan:     Visit Diagnoses: No diagnosis found.  ***  Orders: No orders of the defined types were placed in this encounter.  No orders of the defined types were placed in this encounter.    Follow-Up Instructions: No follow-ups on file.   Metta Clines, RT  Note - This record has been created using [employer].  Chart creation errors have been sought, but may not always  have been located. Such creation errors do not reflect on  the standard of medical care.

## 2023-10-01 ENCOUNTER — Other Ambulatory Visit (HOSPITAL_COMMUNITY): Payer: Self-pay | Admitting: Adult Health Nurse Practitioner

## 2023-10-01 DIAGNOSIS — M7502 Adhesive capsulitis of left shoulder: Secondary | ICD-10-CM

## 2023-10-01 DIAGNOSIS — R053 Chronic cough: Secondary | ICD-10-CM

## 2023-10-01 DIAGNOSIS — M25512 Pain in left shoulder: Secondary | ICD-10-CM

## 2023-10-05 ENCOUNTER — Other Ambulatory Visit: Payer: Self-pay | Admitting: Internal Medicine

## 2023-10-05 ENCOUNTER — Inpatient Hospital Stay: Payer: Medicare HMO | Attending: Hematology

## 2023-10-05 ENCOUNTER — Encounter: Payer: Medicare HMO | Admitting: Surgical

## 2023-10-05 ENCOUNTER — Telehealth: Payer: Self-pay | Admitting: *Deleted

## 2023-10-05 ENCOUNTER — Other Ambulatory Visit: Payer: Self-pay

## 2023-10-05 DIAGNOSIS — D509 Iron deficiency anemia, unspecified: Secondary | ICD-10-CM | POA: Insufficient documentation

## 2023-10-05 DIAGNOSIS — D500 Iron deficiency anemia secondary to blood loss (chronic): Secondary | ICD-10-CM

## 2023-10-05 DIAGNOSIS — D649 Anemia, unspecified: Secondary | ICD-10-CM

## 2023-10-05 LAB — COMPREHENSIVE METABOLIC PANEL
ALT: 11 U/L (ref 0–44)
AST: 17 U/L (ref 15–41)
Albumin: 3.3 g/dL — ABNORMAL LOW (ref 3.5–5.0)
Alkaline Phosphatase: 75 U/L (ref 38–126)
Anion gap: 7 (ref 5–15)
BUN: 9 mg/dL (ref 8–23)
CO2: 25 mmol/L (ref 22–32)
Calcium: 8.3 mg/dL — ABNORMAL LOW (ref 8.9–10.3)
Chloride: 103 mmol/L (ref 98–111)
Creatinine, Ser: 0.46 mg/dL (ref 0.44–1.00)
GFR, Estimated: >60 mL/min (ref >60)
Glucose, Bld: 99 mg/dL (ref 70–99)
Potassium: 3.5 mmol/L (ref 3.5–5.1)
Sodium: 135 mmol/L (ref 135–145)
Total Bilirubin: 0.2 mg/dL (ref 0.0–1.2)
Total Protein: 6.6 g/dL (ref 6.5–8.1)

## 2023-10-05 LAB — CBC WITH DIFFERENTIAL/PLATELET
Abs Immature Granulocytes: 0.06 10*3/uL (ref 0.00–0.07)
Basophils Absolute: 0.1 10*3/uL (ref 0.0–0.1)
Basophils Relative: 0 %
Eosinophils Absolute: 0 10*3/uL (ref 0.0–0.5)
Eosinophils Relative: 0 %
HCT: 24.6 % — ABNORMAL LOW (ref 36.0–46.0)
Hemoglobin: 7.6 g/dL — ABNORMAL LOW (ref 12.0–15.0)
Immature Granulocytes: 0 %
Lymphocytes Relative: 6 %
Lymphs Abs: 0.8 10*3/uL (ref 0.7–4.0)
MCH: 29.2 pg (ref 26.0–34.0)
MCHC: 30.9 g/dL (ref 30.0–36.0)
MCV: 94.6 fL (ref 80.0–100.0)
Monocytes Absolute: 0.4 10*3/uL (ref 0.1–1.0)
Monocytes Relative: 3 %
Neutro Abs: 12.3 10*3/uL — ABNORMAL HIGH (ref 1.7–7.7)
Neutrophils Relative %: 91 %
Platelets: 436 10*3/uL — ABNORMAL HIGH (ref 150–400)
RBC: 2.6 MIL/uL — ABNORMAL LOW (ref 3.87–5.11)
RDW: 18 % — ABNORMAL HIGH (ref 11.5–15.5)
WBC: 13.6 10*3/uL — ABNORMAL HIGH (ref 4.0–10.5)
nRBC: 0 % (ref 0.0–0.2)

## 2023-10-05 LAB — RETICULOCYTES
Immature Retic Fract: 28.6 % — ABNORMAL HIGH (ref 2.3–15.9)
RBC.: 2.65 MIL/uL — ABNORMAL LOW (ref 3.87–5.11)
Retic Count, Absolute: 105.7 10*3/uL (ref 19.0–186.0)
Retic Ct Pct: 4 % — ABNORMAL HIGH (ref 0.4–3.1)

## 2023-10-05 LAB — IRON AND TIBC
Iron: 17 ug/dL — ABNORMAL LOW (ref 28–170)
Saturation Ratios: 7 % — ABNORMAL LOW (ref 10.4–31.8)
TIBC: 254 ug/dL (ref 250–450)
UIBC: 237 ug/dL

## 2023-10-05 LAB — SEDIMENTATION RATE: Sed Rate: 63 mm/h — ABNORMAL HIGH (ref 0–22)

## 2023-10-05 LAB — FERRITIN: Ferritin: 77 ng/mL (ref 11–307)

## 2023-10-05 NOTE — Telephone Encounter (Signed)
 Patient contacted the office stating her Rheumatoid Arthritis and her bursitis is acting up. Patient states she would like to know if she can increase her dose of PLQ. Patient states she is currently on PLQ 200 mg po daily.  Patient states all of her joints are hurting. Patient states she is extremely fatigued. Patient advised Dr. Jeannetta is out of the office and will return on Monday. Patient was upset by this news. Patient encouraged to seek evaluation by urgent care.

## 2023-10-08 ENCOUNTER — Other Ambulatory Visit: Payer: Self-pay | Admitting: Physician Assistant

## 2023-10-08 ENCOUNTER — Inpatient Hospital Stay: Payer: Medicare HMO | Admitting: Oncology

## 2023-10-08 DIAGNOSIS — D500 Iron deficiency anemia secondary to blood loss (chronic): Secondary | ICD-10-CM

## 2023-10-08 DIAGNOSIS — D649 Anemia, unspecified: Secondary | ICD-10-CM

## 2023-10-08 DIAGNOSIS — D509 Iron deficiency anemia, unspecified: Secondary | ICD-10-CM | POA: Diagnosis not present

## 2023-10-08 LAB — CBC
HCT: 26.8 % — ABNORMAL LOW (ref 36.0–46.0)
Hemoglobin: 8.1 g/dL — ABNORMAL LOW (ref 12.0–15.0)
MCH: 28 pg (ref 26.0–34.0)
MCHC: 30.2 g/dL (ref 30.0–36.0)
MCV: 92.7 fL (ref 80.0–100.0)
Platelets: 511 10*3/uL — ABNORMAL HIGH (ref 150–400)
RBC: 2.89 MIL/uL — ABNORMAL LOW (ref 3.87–5.11)
RDW: 18 % — ABNORMAL HIGH (ref 11.5–15.5)
WBC: 16.2 10*3/uL — ABNORMAL HIGH (ref 4.0–10.5)
nRBC: 0 % (ref 0.0–0.2)

## 2023-10-08 LAB — SAMPLE TO BLOOD BANK

## 2023-10-08 LAB — TYPE AND SCREEN
ABO/RH(D): A POS
Antibody Screen: NEGATIVE

## 2023-10-08 NOTE — Progress Notes (Signed)
 [facility] [address] [place], [place] [address]   CLINIC:  Medical Oncology/Hematology  PCP:  Suanne Pfeiffer, NP [address] [place] [address] [phone]  REASON FOR VISIT:  Follow-up for normocytic anemia   CURRENT THERAPY: Intermittent IV iron   INTERVAL HISTORY:   Ms. Charpentier 68 y.o. female returns for routine follow-up of normocytic anemia.  She was last evaluated via telemedicine visit by Pleasant Barefoot PA-C on 07/25/2023.  At today's visit, she reports feeling poorly.  She continues to suffer from chronic pain, malnutrition, weight loss, severe fatigue, and general malaise.  She is seeing multiple specialists for workup and management of these chronic issues.  Since last visit, she received Venofer  200 mg weekly x 4 ([facility]).  She felt some small improvement in energy after IV iron , but has had significant recurrent fatigue over the past week.  She also reports severe cold intolerance, dyspnea on exertion, and palpitations.  Occasional dizziness and headaches.  No chest pain.    She continues to deny any obvious rectal bleeding, melena, epistaxis, or other source of bleeding.  She most recently received steroid injection via orthopedist on 09/06/2023.  She is currently taking daily prednisone  5 mg.  No B symptoms, masses, or lymphadenopathy.  She has little to no energy and 75% appetite.  Her weight remains low but overall stable for the past 6 months.  ASSESSMENT & PLAN:  1.  Normocytic anemia - (anemia of chronic disease +/- iron  deficiency) - History of significant iron  deficiency with mild anemia (Hgb ranging from 11.0-12.0) since at least 2021. - Moderate to severe anemia (Hgb <10.0) since October 2024 (NOTE: Started on Plaquenil  in June 2024 for rheumatoid arthritis) - She was Hemoccult stool POSITIVE x 3. - Most recent EGD (06/18/2023): Normal esophagus.  Diffuse mildly congestive gastric mucosa.   Normal mucosa in duodenum.  Biopsies taken for evaluation of celiac disease were NEGATIVE. - Most recent colonoscopy (06/18/2023): Decreased sphincter tone.  Otherwise normal colon. - Capsule endoscopy (02/08/2021): Complete capsule endoscopy with no findings to explain patient's symptoms - CT enterography abdomen/pelvis (01/04/2021): No abnormal findings to explain chronic iron  deficiency   - Anemia workup summarized below:  Hemolysis panel (08/03/2023): Negative for hemolysis (normal haptoglobin, bilirubin, LDH, DAT) Nutritional panel (02/02/2023): Iron  deficiency/functional iron  deficiency, but otherwise normal B12, MMA, folate, copper . Inflammatory markers elevated. Kidney function is normal to date SPEP negative (2021).    - Most recent IV iron  with Venofer  200 mg x 4 in November/December 2024.  Hgb had improved to 9.8 as of 09/05/2023, before dropping back down to <8.0  - No bright red blood per rectum or melena.   - She has severe chronic fatigue, which is multifactorial - Most recent labs (10/05/2023): Hgb 7.6/MCV 94.6, WBC 13.6, platelets 436 (previous MPN testing was negative) Ferritin 77, iron  saturation 7% with marginally low TIBC 254 and low serum iron  17 Elevated ESR at 63.  Creatinine 0.46. Reticulocytes elevated at 4.0%, but normal bilirubin on CMP. - Repeat CBC (10/07/2022): Hgb 8.1/MCV 92.7 - DIFFERENTIAL DIAGNOSIS favors anemia of chronic disease with functional iron  deficiency, occult GI blood loss, and likely malabsorption secondary to chronic inflammation.  She may also have some drug-induced myelosuppression related to Plaquenil . - PLAN: Recommend IV Venofer  300 mg x 3  First dose to be given TODAY at [facility] Remaining doses to be given at [facility], per patient request - No indication for PRBC  transfusion, as Hgb is now >8.0.  Reticulocyte elevation shows compensatory effort by bone marrow due to worsening anemia. - Check weekly CBC/D and BB sample.  Transfuse PRBC x 1  if Hgb <8.0.   Next lab draw, will check... SPEP, immunofixation, light chains, B12, MMA, folate, copper  Labs prior to follow-up in 6 weeks = CBC/D, ferritin, iron /TIBC, CMP, LDH - Discussion with other specialists: Gastroenterology: Patient is in the process of establishing care with new gastroenterologist ([place]. Mal[place] in [place]).  Once established, we will discuss with Dr. Malachy regarding possible ongoing blood loss. Rheumatology: Message sent to Dr. Jeannetta regarding alternative RA treatment options due to possibility of myelosuppression from Plaquenil .   - RTC 6 weeks for OFFICE visit  >> DISCUSSION WITH SUPERVISING MD (08/07/2023): Discussed with Dr. Rogers.  He agrees with ongoing iron  repletion and close follow-up.  If no improvement after IV iron , may want to reach out to rheumatology to see if Plaquenil  could be held, although patient reports significant improvement after starting Plaquenil .  The timing of her worsening anemia certainly correlates with Plaquenil .  He does not recommend bone marrow biopsy at this time.  2.  Leukocytosis & thrombocytosis - Past CBCs reveal intermittent leukocytosis (neutrophil predominant) and thrombocytosis since at least 2020 - MPN work-up was negative for JAK2 V617F, JAK2 exon 12, MPL and CALR.  Reflex testing to NGS did not reveal any abnormal mutations.  (2021) - BCR/ABL FISH was negative (2021) - Inflammatory markers have been elevated in the setting of rheumatoid arthritis - She receives frequent steroid injections for degenerative joint disease, most recently on 09/06/2023.  She is also taking prednisone  5 mg daily since December 2024. - Findings are consistent with chronic inflammation, chronic steroid use, and reactive leukocytosis/thrombocytosis - Most recent CBC (10/08/2023) with WBC 16.2, platelets 511 (NOTE: Received methylprednisolone  injection on 09/06/2023) - PLAN: Most likely represents reactive leukocytosis/thrombocytosis.  We will  continue active surveillance and consider additional workup if significant deviation from baseline. - Consider repeat MPN workup and/or bone marrow biopsy if WBC >20.0   3.  Weight loss and malnutrition - Patient has had intermittent episodes of weight loss since 2020 - She follows with her PCP, GI, rheumatology, and endocrinology for severe IBS and weight loss. - She is up-to-date on routine cancer screening such as colonoscopy and mammogram. - She has had extensive previous imaging including PET scan in October 2021, all of which has been negative for malignancy - Due to strong family history of malignancy, she was previously recommended for [facility] testing, but this was not covered by insurance and was cost prohibitive. - Weight loss suspected to be due to her underlying inflammatory condition and IBS with poor oral intake - She is taking Marinol as prescribed by PCP - Weight today is overall stable within the same 5 pound range for the past year - PLAN: Weight loss and malnutrition is being managed by her PCP.   4.  PET positive right axillary lymph node - PET scan obtained 07/09/2020 due to unintentional weight loss showed mildly hypermetabolic right axillary lymph node - Right axillary lymph node was palpable on exam (November 2021) - She has completed 5 days of azithromycin  as she had a history of cat bite. - She was advised by Dr. Federico that her lymph node was likely reactive and related to her underlying inflammatory disease, but patient insisted on biopsy. - Patient opted for right axillary lymph node biopsy (10/12/2020), which showed benign reactive lymph node, no  malignancy   5.  PET positive ileocecal lesion - PET scan obtained 07/09/2020 for unintentional weight loss showed accentuated activity along the distalmost terminal ileum and ileocecal valve, possibly physiologic since there was no mass or specific abnormality in this region on diagnostic CT as of 06/23/2020. - Patient had  colonoscopy on 08/12/2020, in which the cecum and IC valvle appeared normal. Terminal ileum could not be intubated due to restricted sigmoid and redundant colon. - CT abdomen/pelvis with contrast (01/04/2021): No abnormal bowel wall thickening or abnormal mural enhancement in the stomach, small bowel, or colon.  No findings to suggest inflammatory or chronic fibrous small bowel stricture.  No mesenteric or intraperitoneal free fluid.  Terminal ileum was normal, no edema or inflammation in the region of the cecum.   6.  Osteoporosis: - DEXA scan on 04/13/2020 shows T score -4.2. - She follows with endocrinology and orthopedics   7.  Rheumatoid arthritis - She follows with Dr. Jeannetta. - Previously followed with Dr. Mai / Rosaline Salt of [facility]   PLAN SUMMARY:  >> IV Venofer  300 mg x 1 to be given TODAY >> Additional IV iron  (Venofer  300 mg x 2) to be given at [facility] >> Weekly labs (CBC/BB sample) to be done at a [facility] on MONDAYS Additional labs next week = SPEP, immunofixation, light chains, B12, MMA, folate, copper  Additional labs at follow-up in 6 weeks = CBC/D, ferritin, iron /TIBC, CMP, LDH >> Same-day labs + OFFICE visit in 6 weeks     REVIEW OF SYSTEMS:   Review of Systems  Constitutional:  Positive for fatigue. Negative for appetite change, chills, diaphoresis, fever and unexpected weight change.  HENT:   Negative for lump/mass and nosebleeds.   Eyes:  Negative for eye problems.  Respiratory:  Positive for shortness of breath. Negative for cough and hemoptysis.   Cardiovascular:  Positive for palpitations. Negative for chest pain and leg swelling.  Gastrointestinal:  Positive for constipation, diarrhea and nausea. Negative for abdominal pain, blood in stool and vomiting.  Genitourinary:  Negative for hematuria.   Musculoskeletal:  Positive for arthralgias.  Skin: Negative.   Neurological:  Positive for dizziness and numbness. Negative for headaches and  light-headedness.  Hematological:  Does not bruise/bleed easily.    PHYSICAL EXAM:  ECOG PERFORMANCE STATUS: 2 - Symptomatic, <50% confined to bed  Vitals:   10/09/23 0956  BP: (!) 103/59  Pulse: (!) 103  Resp: 18  Temp: (!) 97.5 F (36.4 C)   Filed Weights   10/09/23 0956  Weight: 68 lb 5.5 oz (31 kg)   Physical Exam Constitutional:      Appearance: She is cachectic.     Comments: Weak and frail appearing on exam  HENT:     Head: Normocephalic and atraumatic.  Cardiovascular:     Rate and Rhythm: Regular rhythm. Tachycardia present.     Pulses: Normal pulses.     Heart sounds: Normal heart sounds.  Pulmonary:     Effort: Pulmonary effort is normal.     Breath sounds: Normal breath sounds.  Skin:    General: Skin is warm and dry.  Neurological:     General: No focal deficit present.     Mental Status: She is alert and oriented to person, place, and time.  Psychiatric:        Mood and Affect: Mood is anxious. Affect is tearful.        Behavior: Behavior is agitated.     PAST MEDICAL/SURGICAL HISTORY:  Past Medical History:  Diagnosis Date   Anemia    Anxiety    Arthritis    RA   Bursitis    Cataract    CHF (congestive heart failure) (HCC)    Collagen vascular disease (HCC)    Depression    Endometriosis    Heart murmur    High cholesterol    Hypertension    IBS (irritable bowel syndrome)    Lymphadenopathy    Mitral valve prolapse    Osteoporosis    RA (rheumatoid arthritis) (HCC)    Rotator cuff tear    bilateral   Stroke (HCC) 06/2022   Uterus, adenomyosis    Past Surgical History:  Procedure Laterality Date   AXILLARY LYMPH NODE BIOPSY Right 10/12/2020   Procedure: RIGHT AXILLARY LYMPH NODE BIOPSY EXCISION;  Surgeon: Aron Shoulders, MD;  Location: [facility];  Service: General;  Laterality: Right;   BUNIONECTOMY Right    CARPAL TUNNEL RELEASE Right    COLONOSCOPY     COLONOSCOPY N/A 12/13/2015   Procedure: COLONOSCOPY;   Surgeon: Claudis RAYMOND Rivet, MD;  [facility];  Service: Endoscopy;  Laterality: N/A;  9:55   Fatty tumor     2011 (left arm)   TONSILLECTOMY     TOTAL ABDOMINAL HYSTERECTOMY     2003    SOCIAL HISTORY:  Social History   Socioeconomic History   Marital status: Single    Spouse name: Not on file   Number of children: Not on file   Years of education: Not on file   Highest education level: Not on file  Occupational History   Occupation: retired  Tobacco Use   Smoking status: Some Days    Current packs/day: 0.00    Average packs/day: 0.5 packs/day for 33.0 years (16.5 ttl pk-yrs)    Types: Cigarettes    Start date: 8    Last attempt to quit: 2013    Years since quitting: 12.0   Smokeless tobacco: Never  Vaping Use   Vaping status: Never Used  Substance and Sexual Activity   Alcohol use: No    Alcohol/week: 0.0 standard drinks of alcohol   Drug use: No   Sexual activity: Not Currently  Other Topics Concern   Not on file  Social History Narrative   Not on file   Social Drivers of Health   Financial Resource Strain: Low Risk  (11/14/2022)   Received from [facility], [facility]   Overall Financial Resource Strain (CARDIA)    Difficulty of Paying Living Expenses: Not hard at all  Food Insecurity: No Food Insecurity (11/14/2022)   Received from [facility], [facility]   Hunger Vital Sign    Worried About Running Out of Food in the Last Year: Never true    Ran Out of Food in the Last Year: Never true  Transportation Needs: No Transportation Needs (11/14/2022)   Received from [facility], [facility]   PRAPARE - Transportation    Lack of Transportation (Medical): No    Lack of Transportation (Non-Medical): No  Physical Activity: Unknown (11/14/2022)   Received from [facility], [facility]   Exercise Vital Sign    Days of Exercise per Week: 0 days    Minutes of Exercise per Session: Not on file  Stress: Stress Concern Present (11/14/2022)    Received from [facility] - Occupational Stress Questionnaire    Feeling of Stress : Very much  Social Connections: Somewhat Isolated (11/14/2022)   Received from [facility], [facility]   Social Network    How would you rate your social network (family, work, friends)?: Restricted participation with some degree of social isolation  Intimate Partner Violence: Not At Risk (11/14/2022)   Received from [facility], [facility]   HITS    Over the last 12 months how often did your partner physically hurt you?: Never    Over the last 12 months how often did your partner insult you or talk down to you?: Never    Over the last 12 months how often did your partner threaten you with physical harm?: Never    Over the last 12 months how often did your partner scream or curse at you?: Never    FAMILY HISTORY:  Family History  Problem Relation Age of Onset   Bone cancer Mother    Brain cancer Father    Brain cancer Sister    Breast cancer Sister    Renal Disease Sister    Pancreatic cancer Neg Hx    Esophageal cancer Neg Hx    Stomach cancer Neg Hx    Rectal cancer Neg Hx    Colon polyps Neg Hx     CURRENT MEDICATIONS:  Outpatient Encounter Medications as of 10/09/2023  Medication Sig   acetaminophen  (TYLENOL ) 500 MG tablet Take 500 mg by mouth every 6 (six) hours as needed.   acetaminophen -codeine  (TYLENOL  #3) 300-30 MG tablet Take 1 tablet by mouth daily as needed for moderate pain (pain score 4-6).   Ascorbic Acid (VITAMIN C PO) Take by mouth.   ASPIRIN  81 PO Take 1 tablet by mouth daily.   atorvastatin  (LIPITOR) 10 MG tablet Take by mouth.   BD SYRINGE SLIP TIP 25G X 5/8 1 ML MISC USE AS DIRECTED FOR ONCE MONTHLY B12 INJECTIONS   Cholecalciferol (VITAMIN D3) 25 MCG (1000 UT) CAPS 1 capsule Orally Once a day for 30 day(s)   cyanocobalamin  (,VITAMIN B-12,) 1000 MCG/ML injection Inject 1,000 mcg into the skin every 30  (thirty) days.   cyclobenzaprine  (FLEXERIL ) 5 MG tablet TAKE 1 TABLET(5 MG) BY MOUTH AT BEDTIME AS NEEDED FOR MUSCLE SPASMS   diclofenac  Sodium (VOLTAREN ) 1 % GEL SMARTSIG:Gram(s) Topical 4 Times Daily PRN   dicyclomine  (BENTYL ) 10 MG capsule Take 1 capsule (10 mg total) by mouth 4 (four) times daily as needed for spasms.   docusate sodium  (COLACE) 100 MG capsule TAKE 1 CAPSULE(100 MG) BY MOUTH TWICE DAILY   DULoxetine  (CYMBALTA ) 20 MG capsule Take 20 mg by mouth 2 (two) times daily.   fentaNYL  (DURAGESIC ) 50 MCG/HR Place onto the skin every 3 (three) days. Patient states she does it every other day.   folic acid  (FOLVITE ) 1 MG tablet Take 1 mg by mouth daily.   furosemide  (LASIX ) 20 MG tablet Take 20 mg by mouth daily as needed.   Heat Wraps (THERMACARE BACK/HIP) MISC 1 application by Does not apply route daily.   hydroxychloroquine  (PLAQUENIL ) 200 MG tablet TAKE 1/2 TABLET BY MOUTH DAILY   hydrOXYzine  (ATARAX ) 25 MG tablet Take 25 mg by mouth 2 (two) times daily as needed for itching.   hydrOXYzine  (ATARAX ) 50 MG tablet Take by mouth.   Insulin  Pen Needle (SURE COMFORT PEN NEEDLES) 30G X 8 MM MISC Use as directed (Patient taking differently: See admin instructions. Patient states she uses these for Forteo  injection.)   lidocaine  (LIDODERM ) 5 % one patch daily.   lipase/protease/amylase (CREON ) 12000-38000 units  CPEP capsule Use 1 capsule with meals and snacks   LORazepam  (ATIVAN ) 0.5 MG tablet TAKE 1/2 TO 1 TABLET BY MOUTH AT BEDTIME AS NEEDED   magnesium oxide (MAG-OX) 400 (240 Mg) MG tablet Take 1 tablet by mouth daily.   megestrol  (MEGACE ) 40 MG tablet Take 1 tablet (40 mg total) by mouth daily.   metoprolol tartrate (LOPRESSOR) 25 MG tablet    MYRBETRIQ 25 MG TB24 tablet Take 25 mg by mouth daily.   MYRBETRIQ 50 MG TB24 tablet Take 50 mg by mouth daily.   Na Sulfate-K Sulfate-Mg Sulf 17.5-3.13-1.6 GM/177ML SOLN Take 1 kit by mouth as directed.   naloxone (NARCAN) nasal spray 4 mg/0.1 mL     naratriptan  (AMERGE) 2.5 MG tablet Take 1 tablet (2.5 mg total) by mouth as needed for migraine. Take one (1) tablet at onset of headache; if returns or does not resolve, may repeat after 4 hours; do not exceed five (5) mg in 24 hours.   ondansetron  (ZOFRAN -ODT) 4 MG disintegrating tablet Take 4 mg by mouth every 8 (eight) hours as needed.   pantoprazole  (PROTONIX ) 40 MG tablet TAKE 1 TABLET(40 MG) BY MOUTH TWICE DAILY   PARoxetine  (PAXIL ) 20 MG tablet Take 20 mg by mouth daily.   predniSONE  (DELTASONE ) 5 MG tablet Take 5 mg by mouth daily.   pregabalin  (LYRICA ) 25 MG capsule Take 1 capsule (25 mg total) by mouth 2 (two) times daily.   promethazine  (PHENERGAN ) 25 MG tablet Take 0.5-1 tablets (12.5-25 mg total) by mouth every 6 (six) hours as needed for nausea or vomiting.   sucralfate  (CARAFATE ) 1 GM/10ML suspension SHAKE LIQUID AND TAKE 10 ML(1 GRAM) BY MOUTH FOUR TIMES DAILY   Syringe/Needle, Disp, (SYRINGE 3CC/23GX1) 23G X 1 3 ML MISC by Does not apply route.   traZODone  (DESYREL ) 100 MG tablet Take 1 tablet (100 mg total) by mouth at bedtime as needed for sleep.   Zoledronic  Acid (RECLAST  IV) Inject 5 mg into the vein. Once yearly   [DISCONTINUED] atorvastatin  (LIPITOR) 20 MG tablet Take 1 tablet (20 mg total) by mouth daily.   fentaNYL  (DURAGESIC ) 50 MCG/HR Place onto the skin.   Facility-Administered Encounter Medications as of 10/09/2023  Medication   sodium chloride  flush (NS) 0.9 % injection 3-10 mL   sodium chloride  flush (NS) 0.9 % injection 3-10 mL    ALLERGIES:  Allergies  Allergen Reactions   Abatacept  Other (See Comments)   Codeine  Other (See Comments)    Stomach pains/constipation- due to IBS   Golimumab  Other (See Comments)   Hydrocodone  Other (See Comments)    She does not want to take this due to intolerance to codeine .   Methylprednisolone  Diarrhea and Other (See Comments)    Made my IBS flare up  Diarrhea, nausea   Methotrexate Derivatives Other (See  Comments)    Agitation; pt stated, I get no sleep; one dose caused no sleep for 7 days and 7 nights - that was when I got a 0.5 injection   Prednisone  Rash    Cannot take prednisone  by mouth ---- red rash all over  Patient states she got a rash one time     LABORATORY DATA:  I have reviewed the labs as listed.  CBC    Component Value Date/Time   WBC 16.2 (H) 10/08/2023 1521   RBC 2.89 (L) 10/08/2023 1521   HGB 8.1 (L) 10/08/2023 1521   HGB 11.5 10/07/2020 1321   HCT 26.8 (L) 10/08/2023 1521   HCT  34.0 10/07/2020 1321   PLT 511 (H) 10/08/2023 1521   PLT 462 (H) 10/07/2020 1321   MCV 92.7 10/08/2023 1521   MCV 91 10/07/2020 1321   MCH 28.0 10/08/2023 1521   MCHC 30.2 10/08/2023 1521   RDW 18.0 (H) 10/08/2023 1521   RDW 13.7 10/07/2020 1321   LYMPHSABS 0.8 10/05/2023 1327   LYMPHSABS 2.9 10/07/2020 1321   MONOABS 0.4 10/05/2023 1327   EOSABS 0.0 10/05/2023 1327   EOSABS 0.1 10/07/2020 1321   BASOSABS 0.1 10/05/2023 1327   BASOSABS 0.1 10/07/2020 1321      Latest Ref Rng & Units 10/05/2023    1:27 PM 08/03/2023    1:47 PM 06/14/2023    1:31 PM  CMP  Glucose 70 - 99 mg/dL 99   878   BUN 8 - 23 mg/dL 9   10   Creatinine 9.55 - 1.00 mg/dL 9.53   9.29   Sodium 864 - 145 mmol/L 135   139   Potassium 3.5 - 5.1 mmol/L 3.5   3.5   Chloride 98 - 111 mmol/L 103   102   CO2 22 - 32 mmol/L 25   26   Calcium  8.9 - 10.3 mg/dL 8.3   9.1   Total Protein 6.5 - 8.1 g/dL 6.6   7.5   Total Bilirubin 0.0 - 1.2 mg/dL 0.2  0.4  0.3   Alkaline Phos 38 - 126 U/L 75   142   AST 15 - 41 U/L 17   28   ALT 0 - 44 U/L 11   13     DIAGNOSTIC IMAGING:  I have independently reviewed the relevant imaging and discussed with the patient.   WRAP UP:  All questions were answered. The patient knows to call the clinic with any problems, questions or concerns.  Medical decision making: Moderate  Time spent on visit: I spent 45 minutes counseling the patient face to face. The total time spent in the  appointment was 60 minutes and more than 50% was on counseling.  Pleasant CHRISTELLA Barefoot, PA-C  10/09/23 3:48 PM

## 2023-10-08 NOTE — Telephone Encounter (Signed)
 Anticipate visit 1/7 @1300  for repeat exam. Previous loss of response to steroid injections. Is on significant pain medication from palliative service already.

## 2023-10-09 ENCOUNTER — Inpatient Hospital Stay: Payer: Medicare HMO

## 2023-10-09 ENCOUNTER — Ambulatory Visit: Payer: Medicare HMO | Admitting: Internal Medicine

## 2023-10-09 ENCOUNTER — Inpatient Hospital Stay (HOSPITAL_BASED_OUTPATIENT_CLINIC_OR_DEPARTMENT_OTHER): Payer: Medicare HMO | Admitting: Physician Assistant

## 2023-10-09 VITALS — BP 102/50 | HR 90 | Temp 97.8°F | Resp 18

## 2023-10-09 VITALS — BP 103/59 | HR 103 | Temp 97.5°F | Resp 18 | Wt <= 1120 oz

## 2023-10-09 DIAGNOSIS — D72829 Elevated white blood cell count, unspecified: Secondary | ICD-10-CM | POA: Diagnosis not present

## 2023-10-09 DIAGNOSIS — M150 Primary generalized (osteo)arthritis: Secondary | ICD-10-CM

## 2023-10-09 DIAGNOSIS — E559 Vitamin D deficiency, unspecified: Secondary | ICD-10-CM

## 2023-10-09 DIAGNOSIS — D649 Anemia, unspecified: Secondary | ICD-10-CM | POA: Diagnosis not present

## 2023-10-09 DIAGNOSIS — D75839 Thrombocytosis, unspecified: Secondary | ICD-10-CM | POA: Diagnosis not present

## 2023-10-09 DIAGNOSIS — D509 Iron deficiency anemia, unspecified: Secondary | ICD-10-CM

## 2023-10-09 DIAGNOSIS — M0579 Rheumatoid arthritis with rheumatoid factor of multiple sites without organ or systems involvement: Secondary | ICD-10-CM

## 2023-10-09 DIAGNOSIS — M810 Age-related osteoporosis without current pathological fracture: Secondary | ICD-10-CM

## 2023-10-09 DIAGNOSIS — D500 Iron deficiency anemia secondary to blood loss (chronic): Secondary | ICD-10-CM | POA: Diagnosis not present

## 2023-10-09 MED ORDER — SODIUM CHLORIDE 0.9 % IV SOLN
300.0000 mg | Freq: Once | INTRAVENOUS | Status: AC
  Administered 2023-10-09: 300 mg via INTRAVENOUS
  Filled 2023-10-09: qty 300

## 2023-10-09 MED ORDER — SODIUM CHLORIDE 0.9% FLUSH: 3.0000 mL | INTRAVENOUS | Status: DC | PRN

## 2023-10-09 MED ORDER — SODIUM CHLORIDE 0.9% FLUSH: 3.0000 mL | Freq: Two times a day (BID) | INTRAVENOUS | Status: DC

## 2023-10-09 MED ORDER — SODIUM CHLORIDE 0.9 % IV SOLN: INTRAVENOUS | Status: DC

## 2023-10-09 NOTE — Patient Instructions (Signed)
 [facility] - A DEPT OF [facility]  Discharge Instructions: Thank you for choosing [facility] to provide your oncology and hematology care.  If you have a lab appointment with the Cancer Center - please note that after April 8th, 2024, all labs will be drawn in the cancer center.  You do not have to check in or register with the main entrance as you have in the past but will complete your check-in in the cancer center.  Wear comfortable clothing and clothing appropriate for easy access to any Portacath or PICC line.   We strive to give you quality time with your provider. You may need to reschedule your appointment if you arrive late (15 or more minutes).  Arriving late affects you and other patients whose appointments are after yours.  Also, if you miss three or more appointments without notifying the office, you may be dismissed from the clinic at the provider's discretion.      For prescription refill requests, have your pharmacy contact our office and allow 72 hours for refills to be completed.    Today you received the following chemotherapy and/or immunotherapy agents Venofer  300 mg. Iron  Sucrose Injection What is this medication? IRON  SUCROSE (EYE ern SOO krose) treats low levels of iron  (iron  deficiency anemia) in people with kidney disease. Iron  is a mineral that plays an important role in making red blood cells, which carry oxygen from your lungs to the rest of your body. This medicine may be used for other purposes; ask your health care provider or pharmacist if you have questions. COMMON BRAND NAME(S): Venofer  What should I tell my care team before I take this medication? They need to know if you have any of these conditions: Anemia not caused by low iron  levels Heart disease High levels of iron  in the blood Kidney disease Liver disease An unusual or allergic reaction to iron , other medications, foods, dyes, or  preservatives Pregnant or trying to get pregnant Breastfeeding How should I use this medication? This medication is for infusion into a vein. It is given in a hospital or clinic setting. Talk to your care team about the use of this medication in children. While this medication may be prescribed for children as young as 2 years for selected conditions, precautions do apply. Overdosage: If you think you have taken too much of this medicine contact a poison control center or emergency room at once. NOTE: This medicine is only for you. Do not share this medicine with others. What if I miss a dose? Keep appointments for follow-up doses. It is important not to miss your dose. Call your care team if you are unable to keep an appointment. What may interact with this medication? Do not take this medication with any of the following: Deferoxamine Dimercaprol Other iron  products This medication may also interact with the following: Chloramphenicol Deferasirox This list may not describe all possible interactions. Give your health care provider a list of all the medicines, herbs, non-prescription drugs, or dietary supplements you use. Also tell them if you smoke, drink alcohol, or use illegal drugs. Some items may interact with your medicine. What should I watch for while using this medication? Visit your care team regularly. Tell your care team if your symptoms do not start to get better or if they get worse. You may need blood work done while you are taking this medication. You may need to follow a special diet. Talk to  your care team. Foods that contain iron  include: whole grains/cereals, dried fruits, beans, or peas, leafy green vegetables, and organ meats (liver, kidney). What side effects may I notice from receiving this medication? Side effects that you should report to your care team as soon as possible: Allergic reactions--skin rash, itching, hives, swelling of the face, lips, tongue, or  throat Low blood pressure--dizziness, feeling faint or lightheaded, blurry vision Shortness of breath Side effects that usually do not require medical attention (report to your care team if they continue or are bothersome): Flushing Headache Joint pain Muscle pain Nausea Pain, redness, or irritation at injection site This list may not describe all possible side effects. Call your doctor for medical advice about side effects. You may report side effects to FDA at 1-800-FDA-1088. Where should I keep my medication? This medication is given in a hospital or clinic. It will not be stored at home. NOTE: This sheet is a summary. It may not cover all possible information. If you have questions about this medicine, talk to your doctor, pharmacist, or health care provider.  2024 Elsevier/Gold Standard (2023-02-23 00:00:00)       To help prevent nausea and vomiting after your treatment, we encourage you to take your nausea medication as directed.  BELOW ARE SYMPTOMS THAT SHOULD BE REPORTED IMMEDIATELY: *FEVER GREATER THAN 100.4 F (38 C) OR HIGHER *CHILLS OR SWEATING *NAUSEA AND VOMITING THAT IS NOT CONTROLLED WITH YOUR NAUSEA MEDICATION *UNUSUAL SHORTNESS OF BREATH *UNUSUAL BRUISING OR BLEEDING *URINARY PROBLEMS (pain or burning when urinating, or frequent urination) *BOWEL PROBLEMS (unusual diarrhea, constipation, pain near the anus) TENDERNESS IN MOUTH AND THROAT WITH OR WITHOUT PRESENCE OF ULCERS (sore throat, sores in mouth, or a toothache) UNUSUAL RASH, SWELLING OR PAIN  UNUSUAL VAGINAL DISCHARGE OR ITCHING   Items with * indicate a potential emergency and should be followed up as soon as possible or go to the Emergency Department if any problems should occur.  Please show the CHEMOTHERAPY ALERT CARD or IMMUNOTHERAPY ALERT CARD at check-in to the Emergency Department and triage nurse.  Should you have questions after your visit or need to cancel or reschedule your appointment, please  contact [facility] [place] - A DEPT OF [facility] [phone]  and follow the prompts.  Office hours are 8:00 a.m. to 4:30 p.m. Monday - Friday. Please note that voicemails left after 4:00 p.m. may not be returned until the following business day.  We are closed weekends and major holidays. You have access to a nurse at all times for urgent questions. Please call the main number to the clinic [phone] and follow the prompts.  For any non-urgent questions, you may also contact your provider using MyChart. We now offer e-Visits for anyone 70 and older to request care online for non-urgent symptoms. For details visit [email or web address].   Also download the MyChart app! Go to the app store, search MyChart, open the app, select [place], and log in with your MyChart username and password.

## 2023-10-09 NOTE — Patient Instructions (Signed)
 [facility] at [facility] **VISIT SUMMARY & IMPORTANT INSTRUCTIONS **   You were seen today by Pleasant Barefoot PA-C for your anemia and other blood abnormalities.    ANEMIA (low hemoglobin): This is caused by several different factors.  I have described each because and how we can treated in the section below. CHRONIC INFLAMMATION: Chronic inflammation from your rheumatoid arthritis makes it more difficult for your body to absorb iron  or process iron  correctly.  This makes it more difficult for your body to make new blood cells. PLAN >> Continue close follow-up with rheumatology for management of your rheumatoid arthritis. POSSIBLE GASTROINTESTINAL BLEEDING: Even though endoscopy and colonoscopy have not shown any active bleeding, I suspect that you have intermittent blood loss from your stomach and intestines.  I suspect this because you continue to have low iron  even after iron  infusions, you have some drops in your blood count, and your stool testing has showed blood in the past. PLAN >> continue close follow-up with gastroenterology and seek immediate medical attention if you have any evidence of active bleeding (such as bright red blood in the toilet or black tarry bowel movements) POSSIBLE MEDICATION SIDE EFFECT: It is possible that your Plaquenil  is causing anemia. PLAN >> I will discuss this with your rheumatologist (Dr. Jeannetta) to see if he thinks an alternative medication would be better than the Plaquenil , since this medication may or may not be causing some of your anemia.  WHAT WE CAN DO FOR YOUR ANEMIA: We will check your blood count every week to make sure it does not drop too low too quickly. Check these labs on Mondays at the [facility] to ensure that we can follow results closely. If your blood count is <8.0, we will schedule you for blood transfusion. It is against hospital policy to give blood transfusions if hemoglobin is >8.0. It is safest and best  for your body to make its own blood cells, rather than trying to use the blood cells from someone else.  The more blood transfusions you have, the higher risk of life-threatening allergic reaction to someone else's blood. We will schedule you for 3 doses of IV iron . You received your first dose of IV iron  today at the [facility]. We will coordinate with the Infusion Center to schedule your additional IV iron . If you continue to have severe anemia in the future, we would consider additional testing such as bone marrow biopsy.  ELEVATED WHITE BLOOD CELLS & PLATELETS: Your white blood cells and platelets are most likely elevated related to your chronic inflammation as well as your steroid medications.  Previous testing of your blood did not show any evidence of blood cancer.  FOLLOW-UP APPOINTMENT: Office visit in 6 weeks  ** Thank you for trusting me with your healthcare!  I strive to provide all of my patients with quality care at each visit.  If you receive a survey for this visit, I would be so grateful to you for taking the time to provide feedback.  Thank you in advance!  ~ Rebbeca Sheperd                   Dr. Alean Stands   &   Pleasant Barefoot, PA-C   - - - - - - - - - - - - - - - - - -    Thank you for choosing [facility] at [facility] to provide your oncology and hematology care.  To afford each patient quality time with our provider, please arrive at least 15 minutes before your scheduled appointment time.   If you have a lab appointment with the Cancer Center please come in thru the Main Entrance and check in at the main information desk.  You need to re-schedule your appointment should you arrive 10 or more minutes late.  We strive to give you quality time with our providers, and arriving late affects you and other patients whose appointments are after yours.  Also, if you no show three or more times for appointments you may be dismissed from the clinic at  the providers discretion.     Again, thank you for choosing [facility].  Our hope is that these requests will decrease the amount of time that you wait before being seen by our physicians.       _____________________________________________________________  Should you have questions after your visit to [facility], please contact our office at [phone] and follow the prompts.  Our office hours are 8:00 a.m. and 4:30 p.m. Monday - Friday.  Please note that voicemails left after 4:00 p.m. may not be returned until the following business day.  We are closed weekends and major holidays.  You do have access to a nurse 24-7, just call the main number to the clinic [phone] and do not press any options, hold on the line and a nurse will answer the phone.    For prescription refill requests, have your pharmacy contact our office and allow 72 hours.

## 2023-10-09 NOTE — Progress Notes (Signed)
 Patient states she took Tylenol  1000 mg PO prior to coming in. Patient presents today for follow up appointment with R. Pennington PA and venofer  300 mg iron  infusion.   Treatment given today per MD orders. Tolerated infusion without adverse affects. Vital signs stable. No complaints at this time. Discharged from clinic ambulatory in stable condition. Alert and oriented x 3. F/U with [facility] as scheduled.

## 2023-10-09 NOTE — Progress Notes (Signed)
 Patient took her own benadryl this AM

## 2023-10-10 ENCOUNTER — Inpatient Hospital Stay: Payer: Medicare HMO

## 2023-10-10 ENCOUNTER — Other Ambulatory Visit: Payer: Self-pay

## 2023-10-10 ENCOUNTER — Telehealth: Payer: Self-pay

## 2023-10-10 ENCOUNTER — Inpatient Hospital Stay: Payer: Medicare HMO | Admitting: Physician Assistant

## 2023-10-10 NOTE — Telephone Encounter (Signed)
 Vanessa Lara, patient will be scheduled as soon as possible.  Auth Submission: NO AUTH NEEDED Site of care: Site of care: [facility] Payer: [employer] Medication & CPT/J Code(s) submitted: Venofer  (Iron  Sucrose) J1756 Route of submission (phone, fax, portal):  Phone # Fax # Auth type: Buy/Bill PB Units/visits requested: 300mg  x 2 doses Reference number:  Approval from: 10/10/23 to 03/31/24

## 2023-10-11 ENCOUNTER — Ambulatory Visit: Payer: Medicare HMO | Admitting: Orthopedic Surgery

## 2023-10-12 ENCOUNTER — Ambulatory Visit (HOSPITAL_COMMUNITY): Payer: Medicare HMO

## 2023-10-14 ENCOUNTER — Telehealth: Payer: Self-pay | Admitting: Internal Medicine

## 2023-10-14 MED ORDER — HYDROXYCHLOROQUINE SULFATE 200 MG PO TABS: ORAL_TABLET | ORAL | 2 refills | Status: DC

## 2023-10-14 MED ORDER — PREDNISONE 5 MG PO TABS: ORAL_TABLET | ORAL | 0 refills | Status: AC

## 2023-10-14 NOTE — Telephone Encounter (Signed)
 I spoke with Vanessa Lara she is experiencing increased joint pain and stiffness in multiple areas especially in her hands, wrists, knees, and feet. This has been worse since more than one week, and had labs checked with PCP office 10/07/23 noting high sedimentation rate and had prednisone  increased to 5 mg twice daily. So far this was partially helpful but still in severe pain. Also taking tylenol  and using topical voltaren . She asks if plaquenil  may be increased since this seems t be helping a lot. Unfortunately she is already at max dose based on weight for guidelines, and further there is concern by hematologist for maybe worsening her anemia. For now I will increase steroid more to 20 mg for 12 day taper then back to baseline 1 daily. She may continue the current plaquenil . Rx sent now.  We will reschedule an urgent follow up at the clinic sometime this week to discuss if there are other good options for her.

## 2023-10-15 ENCOUNTER — Encounter: Payer: Self-pay | Admitting: Physician Assistant

## 2023-10-15 ENCOUNTER — Encounter: Payer: Self-pay | Admitting: Hematology

## 2023-10-15 ENCOUNTER — Inpatient Hospital Stay: Payer: Medicare HMO

## 2023-10-16 ENCOUNTER — Encounter: Payer: Medicare HMO | Attending: Gastroenterology | Admitting: Emergency Medicine

## 2023-10-16 ENCOUNTER — Telehealth: Payer: Self-pay | Admitting: *Deleted

## 2023-10-16 ENCOUNTER — Ambulatory Visit: Payer: Medicare HMO

## 2023-10-16 VITALS — BP 105/54 | HR 87 | Temp 97.8°F | Resp 18

## 2023-10-16 DIAGNOSIS — D509 Iron deficiency anemia, unspecified: Secondary | ICD-10-CM

## 2023-10-16 DIAGNOSIS — D500 Iron deficiency anemia secondary to blood loss (chronic): Secondary | ICD-10-CM | POA: Insufficient documentation

## 2023-10-16 MED ORDER — IRON SUCROSE 300 MG IVPB - SIMPLE MED
300.0000 mg | Freq: Once | Status: AC
  Administered 2023-10-16: 300 mg via INTRAVENOUS
  Filled 2023-10-16: qty 265

## 2023-10-16 MED ORDER — ACETAMINOPHEN 325 MG PO TABS
650.0000 mg | ORAL_TABLET | Freq: Once | ORAL | Status: DC
Start: 2023-10-16 — End: 2023-10-16

## 2023-10-16 MED ORDER — IRON SUCROSE 300 MG IVPB - SIMPLE MED: 300.0000 mg | Freq: Once | Status: DC

## 2023-10-16 MED ORDER — ACETAMINOPHEN 325 MG PO TABS: 650.0000 mg | ORAL_TABLET | Freq: Once | ORAL | Status: DC

## 2023-10-16 MED ORDER — DIPHENHYDRAMINE HCL 25 MG PO CAPS
25.0000 mg | ORAL_CAPSULE | Freq: Once | ORAL | Status: DC
Start: 2023-10-16 — End: 2023-10-23

## 2023-10-16 MED ORDER — DIPHENHYDRAMINE HCL 25 MG PO CAPS
25.0000 mg | ORAL_CAPSULE | Freq: Once | ORAL | Status: DC
Start: 2023-10-16 — End: 2023-10-16

## 2023-10-16 MED ORDER — DIPHENHYDRAMINE HCL 25 MG PO CAPS: 25.0000 mg | ORAL_CAPSULE | Freq: Once | ORAL | Status: DC

## 2023-10-16 MED ORDER — IRON SUCROSE 20 MG/ML IV SOLN
300.0000 mg | Freq: Once | INTRAVENOUS | Status: DC
  Filled 2023-10-16: qty 15

## 2023-10-16 NOTE — Progress Notes (Signed)
 Diagnosis: Iron  Deficiency Anemia  Provider:  Rogers Hai MD  Procedure: IV Infusion Pre-medications tylenol  650 mg/benadryl  25 mg taken at home prior to arriving.  IV Type: Peripheral, IV Location: R Antecubital  Venofer  (Iron  Sucrose), Dose: 300 mg  Infusion Start Time: 0931  Infusion Stop Time: 1110  Post Infusion IV Care: Observation period completed and Peripheral IV Discontinued  Discharge: Condition: Good, Destination: Home . AVS Provided  Performed by:  Delon ONEIDA Officer, RN

## 2023-10-16 NOTE — Telephone Encounter (Signed)
 Patient called with c/o of 4 episodes of diarrhea since receiving IV iron  this am.  Per Pleasant Barefoot, PAC recommended imodium x 1 dose.  If that is not effective she needs to reach out to her gastroenterologist.  Patient aware and verbalized understanding.

## 2023-10-17 ENCOUNTER — Inpatient Hospital Stay: Payer: Medicare HMO

## 2023-10-17 ENCOUNTER — Telehealth: Payer: Self-pay | Admitting: *Deleted

## 2023-10-17 NOTE — Telephone Encounter (Signed)
 Patient contacted the office and advised she has been taking her PLQ incorrectly. Patient states has been taking 1 tablet po daily instead of 1 tablet po Monday-Friday.

## 2023-10-17 NOTE — Telephone Encounter (Signed)
 I recommend she try taking just 10 mg instead and see if that is more tolerable. She could just stay at that dose for 2 weeks if it does not cause severe side effects before going back down to 1. She is supposed to take 5 doses of hydroxychloroquine  per week based on body weight. If she already took more than than that she should wait till next week to resume. Otherwise just skip as many doses as she exceeded this for now.

## 2023-10-17 NOTE — Telephone Encounter (Signed)
 Patient called back to the office and cancelled her appointment for 10/18/2023. Patient states she has no transportation. Patient states she has taken the PLQ incorrectly and would like to know if she needs to hold this or just start taking it the correct way. Patient states she also started the Prednisone  taper tat was sent to the pharmacy. Patient states she took 20 mg as instructed to begin with. Patient states it wired her up and she was unable to sleep for 2 days. Patient would like to know how to adjust the Prednisone  to help with her pain but allow her not to feel that way. Please advise.

## 2023-10-17 NOTE — Telephone Encounter (Signed)
 Patient called to advise that she has been taking Plaquenil  daily rather than M-F only.  Per Sheril Dines, PAC this could be contributing to her anemia.  Patient is aware that she made an error in dosing and will only take as prescribed.  Has a follow up with her Rheumatologist tomorrow and will discuss.

## 2023-10-17 NOTE — Telephone Encounter (Signed)
 Patient advised Dr. Rodell Citrin recommends she try taking just 10 mg instead and see if that is more tolerable. She could just stay at that dose for 2 weeks if it does not cause severe side effects before going back down to 1. She is supposed to take 5 doses of hydroxychloroquine  per week based on body weight. If she already took more than than that she should wait till next week to resume. Otherwise just skip as many doses as she exceeded this for now.  Patient expressed understanding.

## 2023-10-18 ENCOUNTER — Inpatient Hospital Stay: Payer: Medicare HMO

## 2023-10-18 ENCOUNTER — Encounter: Payer: Self-pay | Admitting: Hematology

## 2023-10-18 ENCOUNTER — Encounter: Payer: Self-pay | Admitting: Physician Assistant

## 2023-10-18 ENCOUNTER — Ambulatory Visit: Payer: Medicare HMO | Admitting: Internal Medicine

## 2023-10-18 DIAGNOSIS — D649 Anemia, unspecified: Secondary | ICD-10-CM

## 2023-10-18 DIAGNOSIS — D500 Iron deficiency anemia secondary to blood loss (chronic): Secondary | ICD-10-CM

## 2023-10-18 DIAGNOSIS — D509 Iron deficiency anemia, unspecified: Secondary | ICD-10-CM | POA: Diagnosis not present

## 2023-10-18 LAB — VITAMIN B12: Vitamin B-12: 1009 pg/mL — ABNORMAL HIGH (ref 180–914)

## 2023-10-18 LAB — CBC
HCT: 25.2 % — ABNORMAL LOW (ref 36.0–46.0)
Hemoglobin: 7.8 g/dL — ABNORMAL LOW (ref 12.0–15.0)
MCH: 28.8 pg (ref 26.0–34.0)
MCHC: 31 g/dL (ref 30.0–36.0)
MCV: 93 fL (ref 80.0–100.0)
Platelets: 445 10*3/uL — ABNORMAL HIGH (ref 150–400)
RBC: 2.71 MIL/uL — ABNORMAL LOW (ref 3.87–5.11)
RDW: 18.3 % — ABNORMAL HIGH (ref 11.5–15.5)
WBC: 16.4 10*3/uL — ABNORMAL HIGH (ref 4.0–10.5)
nRBC: 0 % (ref 0.0–0.2)

## 2023-10-18 LAB — FOLATE: Folate: >40 ng/mL (ref >5.9)

## 2023-10-19 ENCOUNTER — Other Ambulatory Visit: Payer: Self-pay

## 2023-10-19 LAB — COPPER, SERUM: Copper: 147 ug/dL (ref 80–158)

## 2023-10-19 LAB — KAPPA/LAMBDA LIGHT CHAINS
Kappa free light chain: 24.6 mg/L — ABNORMAL HIGH (ref 3.3–19.4)
Kappa, lambda light chain ratio: 1.07 (ref 0.26–1.65)
Lambda free light chains: 22.9 mg/L (ref 5.7–26.3)

## 2023-10-21 LAB — METHYLMALONIC ACID, SERUM: Methylmalonic Acid, Quantitative: 117 nmol/L (ref 0–378)

## 2023-10-22 ENCOUNTER — Inpatient Hospital Stay: Payer: Medicare HMO

## 2023-10-22 ENCOUNTER — Other Ambulatory Visit: Payer: Self-pay | Admitting: *Deleted

## 2023-10-22 ENCOUNTER — Ambulatory Visit (HOSPITAL_COMMUNITY): Payer: Medicare HMO

## 2023-10-22 DIAGNOSIS — R300 Dysuria: Secondary | ICD-10-CM

## 2023-10-22 NOTE — Progress Notes (Signed)
Per request of Rojelio Brenner, [facility], patient called to reschedule lab for this week.  Will come in on Wednesday for CBCD/BB and UA with reflex.  Verbalized understanding.

## 2023-10-23 ENCOUNTER — Ambulatory Visit: Payer: Medicare HMO

## 2023-10-23 ENCOUNTER — Telehealth: Payer: Self-pay | Admitting: Orthopedic Surgery

## 2023-10-23 ENCOUNTER — Encounter: Payer: Self-pay | Admitting: Hematology

## 2023-10-23 ENCOUNTER — Telehealth: Payer: Self-pay

## 2023-10-23 ENCOUNTER — Other Ambulatory Visit (HOSPITAL_COMMUNITY): Payer: Self-pay

## 2023-10-23 ENCOUNTER — Encounter: Payer: Self-pay | Admitting: Physician Assistant

## 2023-10-23 NOTE — Telephone Encounter (Signed)
Pt called stating her hemoglobin is still too low for surgery and would like to know if Dr Lajoyce Corners can put I for her to have a blood transfusion to help her get to 10 for surgery. Also pt states her right ankle is still not fully healed. Pt is asking for a call back at [phone].

## 2023-10-23 NOTE — Telephone Encounter (Signed)
Patient contacted the office and inquires if there is anything that can be added for her pain. Patient states every joint hurts and she is in excruciating pain. Patient states she can no longer do morning activities and the pain keeps her up. Patient states if something can be sent in she would like it before her upcomming appointment to see if it is working by then. Inquired about the prednisone taper sent in on 10/14/2023. Patient states she stopped the prednisone on 10/20/2023 because it was giving her bad brain fog. Patient states she would like a response before 5 o'clock today incase anything is sent in and can be picked up. Please advise.

## 2023-10-23 NOTE — Telephone Encounter (Signed)
Patient advised Dr. Dimple Casey does not have any additional recommendations at this time for her rheumatoid arthritis. She could also reach out to her provider managing her fentanyl or other pain medications if any change is needed. Patient verbalized understanding.

## 2023-10-23 NOTE — Telephone Encounter (Signed)
I don't have any additional recommendations at this time for her rheumatoid arthritis. She could also reach out to her provider managing her fentanyl or other pain medications if any change is needed.

## 2023-10-23 NOTE — Telephone Encounter (Signed)
Pt was previously sch for fusion MTP joint and was going to have xray at follow up to see if she could proceed. Please see her message below. She has a referral in for an infusion and she sees a hematologist.

## 2023-10-23 NOTE — Progress Notes (Deleted)
 Office Visit Note  Patient: Vanessa Lara             Date of Birth: 07-22-1956           MRN: [id number]             PCP: [name], NP Referring: [name], NP Visit Date: 11/01/2023   Subjective:  No chief complaint on file.   History of Present Illness: Vanessa Lara is a 68 y.o. female here for follow up for seropositive RA and osteoarthritis and osteoporosis she stopped hydroxychloroquine after last visit due to concern about cardiac arrhythmia alongside Paxil and duloxetine.    Previous HPI 07/04/2023 Vanessa Lara is a 68 y.o. female here for follow up for seropositive RA and osteoarthritis and osteoporosis she stopped hydroxychloroquine after last visit due to concern about cardiac arrhythmia alongside Paxil and duloxetine.  She was only able to take a low-dose of the medication she did feel it improved her overall condition but still had persistent arthritis of numerous areas regardless.  She had an updated bone density scan on July 15 still with osteoporosis of the lumbar spine although better than 2022 and with femur osteopenia similar to the previous test.  She is taking Venofer for iron deficiency anemia this ongoing to correct it with her goal of enough to tolerate foot surgery.  She underwent endoscopy with no upper or lower GI findings to account for heme positive stool. Acute problem today increased right ankle pain and swelling started since 3 weeks ago.  She did not recall any fall trip or other trauma to that area.  Initially there was a high degree of swelling he put a wrap around this and can see prominent indentation the swelling has largely decreased compared to the outset but still has pain with direct pressure and with weightbearing.   Imaging reviewed DEXA Review [facility] Spine L1-L2 04/16/23  -3.8    0.712 g/cm2 04/12/21  -4.5    0.627 g/cm2 04/13/20  -4.2    0.665 g/cm2   DualFemur Total Mean 04/16/23  -2.6    0.680 g/cm2 04/12/21  -2.6    0.676  g/cm2 04/13/20  -2.2    0.736 g/cm2     03/08/23 Vanessa Lara is a 68 y.o. female here for follow up for seropositive RA.  Currently off any disease specific medication she cannot tolerate taking the sulfasalazine due to severe stomach irritation described as burning up her stomach.  Continues having joint pain and stiffness on a daily basis overall feels her function is very poor due to the severity.  Had labs last month still anemic at hemoglobin of 9.8 being treated with infusion for iron deficiency anemia.   01/16/23 Vanessa Lara is a 68 y.o. female here for follow up for seropositive RA.  Currently health maintenance treatment after discontinuing 5 mg prednisone is on medication for joint pain for RA along with osteoarthritis and chronic pain syndrome.  Had recent knee steroid injection since her last visit but reports overall widespread symptoms are severe.  Particularly is been noticing some increased trouble with pain and some swelling involving the wrist worse on left side.   10/03/22 Vanessa Lara is a 68 y.o. female here for follow up for seropositive RA currently off long term DMARD with prednisone 5 mg daily as needed.  She is overall not doing well feels like pain and stiffness is a big problem but more severe is her fatigue and feels  a generalized getting weaker with difficulty moving and with her upper body strength.  She pretty consistently has to take the prednisone for otherwise does not move around much during the day.  Continues to get a benefit with the Flexeril at nighttime with slight decrease in frequency of awakenings.  She is also concerned about getting an update to her bone density testing ongoing oral alendronate treatment.  She is also still having a lot of pain and difficulty with walking from her claw toe deformities on the foot and planning to follow-up with surgery clinic about management of this.   06/28/22 Vanessa Lara is a 68 y.o. female here for follow up for  seropositive RA now off of treatment after stopping the [place].  Since she stopped taking the medication her complaint of night sweats and fatigue have improved. She has seen a worsening of joint pain in several areas particularly around the MCP joints of both hands. For knee pain she saw orthopedic surgery clinic yesterday and had injections on both sides.  She also discussed getting set up to do right SI joint steroid injection.  Biggest complaint today is back pain on the right side around the middle of the back and around the scapula.  This bothers her especially with certain movements and trying to bend side to side.    05/18/2022  Vanessa Lara is a 68 y.o. female here for follow up for seropositive rheumatoid arthritis on Kevzara 150 mg Vanduser q14days. She feels arthritis has been fairly well controlled but has pain worse in her left foot mostly. She had GI issues suspected UTI but with negative culture since last visit. She feels a sensation "like her brain is numb." Also has ongoing upper neck pain without radiating symptoms.    02/07/2022 Vanessa Lara is a 68 y.o. female here for follow up for rheumatoid arthritis with chronic joint pain in multiple areas and chronic fatigue labs showing elevated rheumatoid factor. Kevzara new start on 12/15/2021. She felt an improvement when taking the injections within first weeks. Accidentally took the shots twice in a week due to forgetfulness or mistake but did not notice any major problems. She called in on account of increased joint pain but felt no improvement with the prescribed medrol dose pack. She did feel more lightheadedness or off balance feeling while taking this so stopped after about 3 days. Currently back pain and her left foot pain are worst issues.   Previous HPI 12/05/2021 Vanessa Lara is a 68 y.o. female here for evaluation of rheumatoid arthritis with chronic joint pain in multiple areas and chronic fatigue labs showing elevated rheumatoid  factor. She has history of mitral valve prolapse, severe osteoporosis, osteoarthritis, and severe weight loss and deconditioning. She was diagnosed with seropositive RA in 2018 with Dr. Deanne Coffer and most recently saw Dr. Dierdre Forth in 11/2020. At that time recommended to start hydroxychloroquine for inflammatory arthritis symptoms. She previously tried methotrexate, simponi aria, and orencia with poor tolerance. Imaging consistent with degenerative arthritis in cervical spine and knee but also with some partial tendon tear in shoulder and some synovitis present in knee on MRI imaging. She has taken multiple medications for pain including NSAIDs, prednisone, gabapentin, tramadol, and hydrocodone or oxycodone. She has a lot of pain in multiple areas but is also severely fatigued. She has significant weight loss down to 70 lbs unintentionally reports chronic IBS issues but no recent changes during this period of weight loss. Joint pain also limits her  activity quite a bit with chronic deformities and also ongoing swelling in bilateral hands. She has also developed left sided headache localized above the temporal area. She has a lot of pain at the base of the skull and occiput without clear radiation of symptoms. She is scheduled to see Dr. Delena Bali for headache evaluation and management 3/28.   DMARD Hx HCQ SSZ MTX Simponi Orencia Kevzara   05/2017 RF 15 CCP 32 HBV neg HCV neg   No Rheumatology ROS completed.   PMFS History:  Patient Active Problem List   Diagnosis Date Noted   High risk medication use 01/16/2023   Medication monitoring encounter 12/05/2021   Physical deconditioning 11/11/2021   Elevated serum lactate dehydrogenase (LDH) 08/17/2021   Vitamin D deficiency 08/17/2021   Chronic fatigue 07/25/2021   Vitamin B12 deficiency 07/19/2021   Hardening of the aorta (main artery of the heart) (HCC) 06/21/2021   Solitary lung nodule 06/15/2021   Loss of weight 05/18/2021   Protein-calorie  malnutrition, severe (HCC) 05/18/2021   Osteoporosis 06/15/2020   Iron deficiency anemia 04/09/2020   Primary osteoarthritis involving multiple joints 12/31/2018   Rheumatoid arthritis involving multiple sites with positive rheumatoid factor (HCC) 12/31/2018   Irritable bowel syndrome with both constipation and diarrhea 10/28/2018   Constipation 03/14/2016   Mitral valve prolapse 11/23/2015   High cholesterol 11/23/2015    Past Medical History:  Diagnosis Date   Anemia    Anxiety    Arthritis    RA   Bursitis    Cataract    CHF (congestive heart failure) (HCC)    Collagen vascular disease (HCC)    Depression    Endometriosis    Heart murmur    High cholesterol    Hypertension    IBS (irritable bowel syndrome)    Lymphadenopathy    Mitral valve prolapse    Osteoporosis    RA (rheumatoid arthritis) (HCC)    Rotator cuff tear    bilateral   Stroke (HCC) 06/2022   Uterus, adenomyosis     Family History  Problem Relation Age of Onset   Bone cancer Mother    Brain cancer Father    Brain cancer Sister    Breast cancer Sister    Renal Disease Sister    Pancreatic cancer Neg Hx    Esophageal cancer Neg Hx    Stomach cancer Neg Hx    Rectal cancer Neg Hx    Colon polyps Neg Hx    Past Surgical History:  Procedure Laterality Date   AXILLARY LYMPH NODE BIOPSY Right 10/12/2020   Procedure: RIGHT AXILLARY LYMPH NODE BIOPSY EXCISION;  Surgeon: Almond Lint, MD;  Location: [facility];  Service: General;  Laterality: Right;   BUNIONECTOMY Right    CARPAL TUNNEL RELEASE Right    COLONOSCOPY     COLONOSCOPY N/A 12/13/2015   Procedure: COLONOSCOPY;  Surgeon: Malissa Hippo, MD;  [facility];  Service: Endoscopy;  Laterality: N/A;  9:55   Fatty tumor     2011 (left arm)   TONSILLECTOMY     TOTAL ABDOMINAL HYSTERECTOMY     2003   Social History   Social History Narrative   Not on file   Immunization History  Administered Date(s) Administered    Influenza, Quadrivalent, Recombinant, Inj, Pf 07/11/2017     Objective: Vital Signs: There were no vitals taken for this visit.   Physical Exam   Musculoskeletal Exam: ***  CDAI Exam: CDAI Score: -- Patient  Global: --; Provider Global: -- Swollen: --; Tender: -- Joint Exam 11/01/2023   No joint exam has been documented for this visit   There is currently no information documented on the homunculus. Go to the Rheumatology activity and complete the homunculus joint exam.  Investigation: No additional findings.  Imaging: No results found.  Recent Labs: Lab Results  Component Value Date   WBC 16.4 (H) 10/18/2023   HGB 7.8 (L) 10/18/2023   PLT 445 (H) 10/18/2023   NA 135 10/05/2023   K 3.5 10/05/2023   CL 103 10/05/2023   CO2 25 10/05/2023   GLUCOSE 99 10/05/2023   BUN 9 10/05/2023   CREATININE 0.46 10/05/2023   BILITOT 0.2 10/05/2023   ALKPHOS 75 10/05/2023   AST 17 10/05/2023   ALT 11 10/05/2023   PROT 6.6 10/05/2023   ALBUMIN 3.3 (L) 10/05/2023   CALCIUM 8.3 (L) 10/05/2023   GFRAA 92 11/06/2019   QFTBGOLDPLUS NEGATIVE 12/05/2021    Speciality Comments: No specialty comments available.  Procedures:  No procedures performed Allergies: Abatacept, Codeine, Golimumab, Hydrocodone, Methylprednisolone, Methotrexate derivatives, and Prednisone   Assessment / Plan:     Visit Diagnoses: No diagnosis found.  ***  Orders: No orders of the defined types were placed in this encounter.  No orders of the defined types were placed in this encounter.    Follow-Up Instructions: No follow-ups on file.   Metta Clines, RT  Note - This record has been created using [employer].  Chart creation errors have been sought, but may not always  have been located. Such creation errors do not reflect on  the standard of medical care.

## 2023-10-24 ENCOUNTER — Telehealth: Payer: Self-pay | Admitting: *Deleted

## 2023-10-24 ENCOUNTER — Inpatient Hospital Stay: Payer: Medicare HMO

## 2023-10-24 DIAGNOSIS — D500 Iron deficiency anemia secondary to blood loss (chronic): Secondary | ICD-10-CM

## 2023-10-24 DIAGNOSIS — D509 Iron deficiency anemia, unspecified: Secondary | ICD-10-CM | POA: Diagnosis not present

## 2023-10-24 DIAGNOSIS — R300 Dysuria: Secondary | ICD-10-CM

## 2023-10-24 LAB — CBC
HCT: 29.4 % — ABNORMAL LOW (ref 36.0–46.0)
Hemoglobin: 8.9 g/dL — ABNORMAL LOW (ref 12.0–15.0)
MCH: 28.3 pg (ref 26.0–34.0)
MCHC: 30.3 g/dL (ref 30.0–36.0)
MCV: 93.3 fL (ref 80.0–100.0)
Platelets: 488 10*3/uL — ABNORMAL HIGH (ref 150–400)
RBC: 3.15 MIL/uL — ABNORMAL LOW (ref 3.87–5.11)
RDW: 17.7 % — ABNORMAL HIGH (ref 11.5–15.5)
WBC: 14 10*3/uL — ABNORMAL HIGH (ref 4.0–10.5)
nRBC: 0 % (ref 0.0–0.2)

## 2023-10-24 LAB — URINALYSIS, ROUTINE W REFLEX MICROSCOPIC
Bilirubin Urine: NEGATIVE
Glucose, UA: NEGATIVE mg/dL
Hgb urine dipstick: NEGATIVE
Ketones, ur: NEGATIVE mg/dL
Leukocytes,Ua: NEGATIVE
Nitrite: NEGATIVE
Protein, ur: NEGATIVE mg/dL
Specific Gravity, Urine: 1.002 — ABNORMAL LOW (ref 1.005–1.030)
pH: 7 (ref 5.0–8.0)

## 2023-10-24 LAB — SAMPLE TO BLOOD BANK

## 2023-10-24 NOTE — Telephone Encounter (Signed)
Can you please call pt and make an appt for first available Dr. Lajoyce Corners would like to see her in the office.

## 2023-10-24 NOTE — Telephone Encounter (Signed)
Patient made aware that her urine did not show any signs of infection.  Hgb 8.9 today and blood transfusion is not needed.  Verbalized understanding.

## 2023-10-25 ENCOUNTER — Inpatient Hospital Stay: Payer: Medicare HMO

## 2023-10-26 ENCOUNTER — Encounter (INDEPENDENT_AMBULATORY_CARE_PROVIDER_SITE_OTHER): Payer: Medicare HMO | Admitting: Internal Medicine

## 2023-10-26 VITALS — BP 100/61 | HR 94 | Temp 97.6°F | Resp 18

## 2023-10-26 DIAGNOSIS — D500 Iron deficiency anemia secondary to blood loss (chronic): Secondary | ICD-10-CM

## 2023-10-26 DIAGNOSIS — D509 Iron deficiency anemia, unspecified: Secondary | ICD-10-CM | POA: Diagnosis not present

## 2023-10-26 MED ORDER — IRON SUCROSE 300 MG IVPB - SIMPLE MED
300.0000 mg | Freq: Once | Status: AC
  Administered 2023-10-26: 300 mg via INTRAVENOUS
  Filled 2023-10-26: qty 265

## 2023-10-26 NOTE — Progress Notes (Signed)
Diagnosis: Iron Deficiency Anemia  Provider:  Doreatha Massed MD  Procedure: IV Infusion  IV Type: Peripheral, IV Location: R Antecubital  Venofer (Iron Sucrose), Dose: 300 mg  Infusion Start Time: 1332  Infusion Stop Time: 1504  Post Infusion IV Care: Observation period completed  Discharge: Condition: Good, Destination: Home . AVS Provided  Performed by:  Cleotilde Neer, LPN

## 2023-10-28 LAB — PROTEIN ELECTROPHORESIS, SERUM
A/G Ratio: 1.2 (ref 0.7–1.7)
Albumin ELP: 3.5 g/dL (ref 2.9–4.4)
Alpha-1-Globulin: 0.3 g/dL (ref 0.0–0.4)
Alpha-2-Globulin: 0.8 g/dL (ref 0.4–1.0)
Beta Globulin: 1 g/dL (ref 0.7–1.3)
Gamma Globulin: 0.8 g/dL (ref 0.4–1.8)
Globulin, Total: 2.9 g/dL (ref 2.2–3.9)
Total Protein ELP: 6.4 g/dL (ref 6.0–8.5)

## 2023-10-28 LAB — IMMUNOFIXATION ELECTROPHORESIS
IgA: 486 mg/dL — ABNORMAL HIGH (ref 87–352)
IgG (Immunoglobin G), Serum: 613 mg/dL (ref 586–1602)
IgM (Immunoglobulin M), Srm: 122 mg/dL (ref 26–217)
Total Protein ELP: 6.6 g/dL (ref 6.0–8.5)

## 2023-10-29 ENCOUNTER — Other Ambulatory Visit: Payer: Medicare HMO

## 2023-10-29 ENCOUNTER — Inpatient Hospital Stay: Payer: Medicare HMO

## 2023-10-29 DIAGNOSIS — D509 Iron deficiency anemia, unspecified: Secondary | ICD-10-CM | POA: Diagnosis not present

## 2023-10-29 DIAGNOSIS — D500 Iron deficiency anemia secondary to blood loss (chronic): Secondary | ICD-10-CM

## 2023-10-29 LAB — CBC
HCT: 25.4 % — ABNORMAL LOW (ref 36.0–46.0)
Hemoglobin: 7.8 g/dL — ABNORMAL LOW (ref 12.0–15.0)
MCH: 29 pg (ref 26.0–34.0)
MCHC: 30.7 g/dL (ref 30.0–36.0)
MCV: 94.4 fL (ref 80.0–100.0)
Platelets: 457 10*3/uL — ABNORMAL HIGH (ref 150–400)
RBC: 2.69 MIL/uL — ABNORMAL LOW (ref 3.87–5.11)
RDW: 17.8 % — ABNORMAL HIGH (ref 11.5–15.5)
WBC: 11.5 10*3/uL — ABNORMAL HIGH (ref 4.0–10.5)
nRBC: 0 % (ref 0.0–0.2)

## 2023-10-29 LAB — SAMPLE TO BLOOD BANK

## 2023-10-29 LAB — PREPARE RBC (CROSSMATCH)

## 2023-10-29 NOTE — Progress Notes (Signed)
 HGB 7.8. Patient is scheduled for 1 unit of blood tomorrow at 08:30 am. Orders in . Lab and patient aware.

## 2023-10-30 ENCOUNTER — Inpatient Hospital Stay: Payer: Medicare HMO

## 2023-10-30 DIAGNOSIS — D509 Iron deficiency anemia, unspecified: Secondary | ICD-10-CM | POA: Diagnosis not present

## 2023-10-30 DIAGNOSIS — D500 Iron deficiency anemia secondary to blood loss (chronic): Secondary | ICD-10-CM

## 2023-10-30 MED ORDER — SODIUM CHLORIDE 0.9% IV SOLUTION
250.0000 mL | INTRAVENOUS | Status: DC
  Administered 2023-10-30: 100 mL via INTRAVENOUS

## 2023-10-30 MED ORDER — DIPHENHYDRAMINE HCL 25 MG PO CAPS: 25.0000 mg | ORAL_CAPSULE | Freq: Once | ORAL | Status: DC

## 2023-10-30 MED ORDER — ACETAMINOPHEN 325 MG PO TABS: 650.0000 mg | ORAL_TABLET | Freq: Once | ORAL | Status: DC

## 2023-10-30 NOTE — Progress Notes (Signed)
Patient took own Tylenol and Benadryl from home.   Patient tolerated transfusion with no complaints voiced.  Side effects with management reviewed with understanding verbalized.  Port site clean and dry with no bruising or swelling noted at site.  Good blood return noted before and after administration.  Band aid applied.  Patient left in satisfactory condition with VSS and no s/s of distress noted.

## 2023-10-30 NOTE — Patient Instructions (Addendum)
Blood Transfusion, Adult, Care After The following information offers guidance on how to care for yourself after your procedure. Your health care provider may also give you more specific instructions. If you have problems or questions, contact your health care provider. What can I expect after the procedure? After the procedure, it is common to have: Bruising and soreness where the IV was inserted. A headache. Follow these instructions at home: IV insertion site care     Follow instructions from your health care provider about how to take care of your IV insertion site. Make sure you: Wash your hands with soap and water for at least 20 seconds before and after you change your bandage (dressing). If soap and water are not available, use hand sanitizer. Change your dressing as told by your health care provider. Check your IV insertion site every day for signs of infection. Check for: Redness, swelling, or pain. Bleeding from the site. Warmth. Pus or a bad smell. General instructions Take over-the-counter and prescription medicines only as told by your health care provider. Rest as told by your health care provider. Return to your normal activities as told by your health care provider. Keep all follow-up visits. Lab tests may need to be done at certain periods to recheck your blood counts. Contact a health care provider if: You have itching or red, swollen areas of skin (hives). You have a fever or chills. You have pain in the head, back, or chest. You feel anxious or you feel weak after doing your normal activities. You have redness, swelling, warmth, or pain around the IV insertion site. You have blood coming from the IV insertion site that does not stop with pressure. You have pus or a bad smell coming from your IV insertion site. If you received your blood transfusion in an outpatient setting, you will be told whom to contact to report any reactions. Get help right away if: You  have symptoms of a serious allergic or immune system reaction, including: Trouble breathing or shortness of breath. Swelling of the face, feeling flushed, or widespread rash. Dark urine or blood in the urine. Fast heartbeat. These symptoms may be an emergency. Get help right away. Call 911. Do not wait to see if the symptoms will go away. Do not drive yourself to the hospital. Summary Bruising and soreness around the IV insertion site are common. Check your IV insertion site every day for signs of infection. Rest as told by your health care provider. Return to your normal activities as told by your health care provider. Get help right away for symptoms of a serious allergic or immune system reaction to the blood transfusion. This information is not intended to replace advice given to you by your health care provider. Make sure you discuss any questions you have with your health care provider. Document Revised: 12/16/2021 Document Reviewed: 12/16/2021 Elsevier Patient Education  2024 Elsevier Inc.  [facility] [place] - A DEPT OF [facility]  Discharge Instructions: Thank you for choosing [facility] to provide your oncology and hematology care.  If you have a lab appointment with the Cancer Center - please note that after April 8th, 2024, all labs will be drawn in the cancer center.  You do not have to check in or register with the main entrance as you have in the past but will complete your check-in in the cancer center.  Wear comfortable clothing and clothing appropriate for easy access to any  Portacath or PICC line.   We strive to give you quality time with your provider. You may need to reschedule your appointment if you arrive late (15 or more minutes).  Arriving late affects you and other patients whose appointments are after yours.  Also, if you miss three or more appointments without notifying the office, you may be dismissed from the clinic at  the provider's discretion.      For prescription refill requests, have your pharmacy contact our office and allow 72 hours for refills to be completed.      To help prevent nausea and vomiting after your treatment, we encourage you to take your nausea medication as directed.  BELOW ARE SYMPTOMS THAT SHOULD BE REPORTED IMMEDIATELY: *FEVER GREATER THAN 100.4 F (38 C) OR HIGHER *CHILLS OR SWEATING *NAUSEA AND VOMITING THAT IS NOT CONTROLLED WITH YOUR NAUSEA MEDICATION *UNUSUAL SHORTNESS OF BREATH *UNUSUAL BRUISING OR BLEEDING *URINARY PROBLEMS (pain or burning when urinating, or frequent urination) *BOWEL PROBLEMS (unusual diarrhea, constipation, pain near the anus) TENDERNESS IN MOUTH AND THROAT WITH OR WITHOUT PRESENCE OF ULCERS (sore throat, sores in mouth, or a toothache) UNUSUAL RASH, SWELLING OR PAIN  UNUSUAL VAGINAL DISCHARGE OR ITCHING   Items with * indicate a potential emergency and should be followed up as soon as possible or go to the Emergency Department if any problems should occur.  Please show the CHEMOTHERAPY ALERT CARD or IMMUNOTHERAPY ALERT CARD at check-in to the Emergency Department and triage nurse.  Should you have questions after your visit or need to cancel or reschedule your appointment, please contact [facility] CANCER CTR [place] - A DEPT OF [facility] [phone]  and follow the prompts.  Office hours are 8:00 a.m. to 4:30 p.m. Monday - Friday. Please note that voicemails left after 4:00 p.m. may not be returned until the following business day.  We are closed weekends and major holidays. You have access to a nurse at all times for urgent questions. Please call the main number to the clinic [phone] and follow the prompts.  For any non-urgent questions, you may also contact your provider using MyChart. We now offer e-Visits for anyone 72 and older to request care online for non-urgent symptoms. For details visit [email or web address].   Also  download the MyChart app! Go to the app store, search "MyChart", open the app, select [place], and log in with your MyChart username and password.

## 2023-10-31 ENCOUNTER — Other Ambulatory Visit: Payer: Self-pay

## 2023-10-31 ENCOUNTER — Telehealth: Payer: Self-pay

## 2023-10-31 DIAGNOSIS — D649 Anemia, unspecified: Secondary | ICD-10-CM

## 2023-10-31 DIAGNOSIS — D500 Iron deficiency anemia secondary to blood loss (chronic): Secondary | ICD-10-CM

## 2023-10-31 LAB — BPAM RBC
Blood Product Expiration Date: 202502122359
ISSUE DATE / TIME: 202501280852
Unit Type and Rh: 202502122359
Unit Type and Rh: 6200

## 2023-10-31 LAB — TYPE AND SCREEN
ABO/RH(D): A POS
Antibody Screen: NEGATIVE
Unit division: 0

## 2023-10-31 NOTE — Telephone Encounter (Signed)
Patient called stating that she has not had a "sudden burst of energy," after her blood transfusion yesterday. Explained to the patient that not all patients experience what she desires as everyone reacts differently. Patient states that she needs her blood rechecked as soon as possible. Patient scheduled for lab draw on 10/31/22 at 1230 per her request. Lupe Carney, PA made aware and agreeable with plan. Lab orders received for CBC and BB Sample, orders placed per verbal order.

## 2023-11-01 ENCOUNTER — Inpatient Hospital Stay: Payer: Medicare HMO

## 2023-11-01 ENCOUNTER — Ambulatory Visit: Payer: Medicare HMO | Admitting: Internal Medicine

## 2023-11-01 DIAGNOSIS — M0579 Rheumatoid arthritis with rheumatoid factor of multiple sites without organ or systems involvement: Secondary | ICD-10-CM

## 2023-11-01 DIAGNOSIS — D649 Anemia, unspecified: Secondary | ICD-10-CM

## 2023-11-01 DIAGNOSIS — D509 Iron deficiency anemia, unspecified: Secondary | ICD-10-CM | POA: Diagnosis not present

## 2023-11-01 DIAGNOSIS — E559 Vitamin D deficiency, unspecified: Secondary | ICD-10-CM

## 2023-11-01 DIAGNOSIS — D500 Iron deficiency anemia secondary to blood loss (chronic): Secondary | ICD-10-CM

## 2023-11-01 DIAGNOSIS — Z79899 Other long term (current) drug therapy: Secondary | ICD-10-CM

## 2023-11-01 DIAGNOSIS — M150 Primary generalized (osteo)arthritis: Secondary | ICD-10-CM

## 2023-11-01 DIAGNOSIS — M810 Age-related osteoporosis without current pathological fracture: Secondary | ICD-10-CM

## 2023-11-01 LAB — CBC WITH DIFFERENTIAL/PLATELET
Abs Immature Granulocytes: 0.03 10*3/uL (ref 0.00–0.07)
Basophils Absolute: 0 10*3/uL (ref 0.0–0.1)
Basophils Relative: 0 %
Eosinophils Absolute: 0.1 10*3/uL (ref 0.0–0.5)
Eosinophils Relative: 1 %
HCT: 31.6 % — ABNORMAL LOW (ref 36.0–46.0)
Hemoglobin: 10 g/dL — ABNORMAL LOW (ref 12.0–15.0)
Immature Granulocytes: 0 %
Lymphocytes Relative: 14 %
Lymphs Abs: 1.2 10*3/uL (ref 0.7–4.0)
MCH: 29.1 pg (ref 26.0–34.0)
MCHC: 31.6 g/dL (ref 30.0–36.0)
MCV: 91.9 fL (ref 80.0–100.0)
Monocytes Absolute: 0.4 10*3/uL (ref 0.1–1.0)
Monocytes Relative: 5 %
Neutro Abs: 7 10*3/uL (ref 1.7–7.7)
Neutrophils Relative %: 80 %
Platelets: 401 10*3/uL — ABNORMAL HIGH (ref 150–400)
RBC: 3.44 MIL/uL — ABNORMAL LOW (ref 3.87–5.11)
RDW: 17.3 % — ABNORMAL HIGH (ref 11.5–15.5)
WBC: 8.8 10*3/uL (ref 4.0–10.5)
nRBC: 0 % (ref 0.0–0.2)

## 2023-11-01 LAB — SAMPLE TO BLOOD BANK

## 2023-11-02 ENCOUNTER — Telehealth: Payer: Self-pay | Admitting: Physician Assistant

## 2023-11-02 DIAGNOSIS — D649 Anemia, unspecified: Secondary | ICD-10-CM

## 2023-11-02 NOTE — Telephone Encounter (Signed)
We have been continuing to keep close watch on Ms. Kamath's hemoglobin trend, and she continues to have severe anemia despite most recent iron (Venofer 600 mg in the past 3 weeks).  She received PRBC x 1 on Tuesday, 10/30/2023 due to Hgb 7.8, symptomatic.  Per discussion with supervising MD (Dr. Ellin Saba) on Wednesday, 10/31/2023, he recommends pursuing bone marrow biopsy at this time to rule out any MDS or other infiltrative bone marrow disorder as cause of anemia.  Chronic blood loss, anemia of chronic disease, and anemia secondary to medication also remains within the differential.  I called and discussed the above with patient at 12:10 PM on 11/02/2023.  She verbalizes understanding and agreement with bone marrow biopsy, and request for this to be done at [facility] where she can receive sedative agent during procedure.  Carnella Guadalajara, PA-C  11/02/23 12:23 PM

## 2023-11-02 NOTE — Addendum Note (Signed)
Addended by: Rojelio Brenner on: 11/02/2023 12:46 PM   Modules accepted: Orders

## 2023-11-03 ENCOUNTER — Ambulatory Visit (HOSPITAL_COMMUNITY): Admission: RE | Admit: 2023-11-03 | Payer: Medicare HMO | Source: Ambulatory Visit

## 2023-11-03 ENCOUNTER — Encounter: Payer: Self-pay | Admitting: Physician Assistant

## 2023-11-05 ENCOUNTER — Inpatient Hospital Stay: Payer: Medicare HMO | Attending: Hematology

## 2023-11-05 ENCOUNTER — Other Ambulatory Visit: Payer: Self-pay | Admitting: Surgical

## 2023-11-05 DIAGNOSIS — K921 Melena: Secondary | ICD-10-CM | POA: Diagnosis not present

## 2023-11-05 DIAGNOSIS — D500 Iron deficiency anemia secondary to blood loss (chronic): Secondary | ICD-10-CM | POA: Insufficient documentation

## 2023-11-05 LAB — CBC
HCT: 33.8 % — ABNORMAL LOW (ref 36.0–46.0)
Hemoglobin: 10.3 g/dL — ABNORMAL LOW (ref 12.0–15.0)
MCH: 28.5 pg (ref 26.0–34.0)
MCHC: 30.5 g/dL (ref 30.0–36.0)
MCV: 93.4 fL (ref 80.0–100.0)
Platelets: 372 10*3/uL (ref 150–400)
RBC: 3.62 MIL/uL — ABNORMAL LOW (ref 3.87–5.11)
RDW: 16.3 % — ABNORMAL HIGH (ref 11.5–15.5)
WBC: 12.1 10*3/uL — ABNORMAL HIGH (ref 4.0–10.5)
nRBC: 0 % (ref 0.0–0.2)

## 2023-11-05 LAB — SAMPLE TO BLOOD BANK

## 2023-11-07 ENCOUNTER — Other Ambulatory Visit (HOSPITAL_COMMUNITY): Payer: Self-pay

## 2023-11-07 ENCOUNTER — Ambulatory Visit: Payer: Medicare HMO | Admitting: Orthopaedic Surgery

## 2023-11-07 NOTE — Telephone Encounter (Signed)
 I received a phone call from the answering service regarding Vanessa Lara.  I spoke with patient.  She mentioned that she has been having severe pain and discomfort despite taking Tylenol  with codeine  and hydroxychloroquine .  She wanted to discuss with Dr. Jeannetta that her hemoglobin is better now and she would like to increase the dose of hydroxychloroquine .  I had a brief discussion with the patient and I explained to her that based on her weight of 31 kg her daily dose of hydroxychloroquine  is about 150 mg p.o. daily.  Based on her weight Dr. Jeannetta is prescribing her hydroxychloroquine  200 mg daily 5 days a week.  I explained to her that higher dose of hydroxychloroquine  will cause ocular toxicity.  She wants to discuss this further with Dr. Jeannetta.  I advised her that I will leave a message for Dr. Jeannetta to call her tomorrow. Maya Nash, MD

## 2023-11-09 ENCOUNTER — Other Ambulatory Visit: Payer: Self-pay

## 2023-11-09 NOTE — Telephone Encounter (Signed)
 Attempted to call back for 2nd day today did not answer, unable to leave VM due to full mailbox. Agree with the plan to follow up in clinic next week as we really cannot make any more increase the hydroxychloroquine  dosing, and has tried numerous other medications so far.

## 2023-11-10 ENCOUNTER — Ambulatory Visit (HOSPITAL_COMMUNITY)
Admission: RE | Admit: 2023-11-10 | Discharge: 2023-11-10 | Disposition: A | Discharge status: Home / Self Care | Payer: Medicare HMO | Source: Ambulatory Visit | Attending: Adult Health Nurse Practitioner

## 2023-11-10 DIAGNOSIS — M7502 Adhesive capsulitis of left shoulder: Secondary | ICD-10-CM | POA: Diagnosis present

## 2023-11-10 DIAGNOSIS — M25512 Pain in left shoulder: Secondary | ICD-10-CM

## 2023-11-12 ENCOUNTER — Encounter: Payer: Self-pay | Admitting: Orthopedic Surgery

## 2023-11-12 ENCOUNTER — Ambulatory Visit: Payer: Medicare HMO | Admitting: Orthopedic Surgery

## 2023-11-12 ENCOUNTER — Inpatient Hospital Stay: Payer: Medicare HMO

## 2023-11-12 VITALS — Ht 60.0 in | Wt <= 1120 oz

## 2023-11-12 DIAGNOSIS — M25571 Pain in right ankle and joints of right foot: Secondary | ICD-10-CM

## 2023-11-13 ENCOUNTER — Inpatient Hospital Stay: Payer: Medicare HMO

## 2023-11-13 ENCOUNTER — Ambulatory Visit: Payer: Medicare HMO | Admitting: Internal Medicine

## 2023-11-13 ENCOUNTER — Telehealth: Payer: Self-pay | Admitting: Internal Medicine

## 2023-11-13 NOTE — Progress Notes (Deleted)
 Office Visit Note  Patient: Vanessa Lara             Date of Birth: September 21, 1956           MRN: [id number]             PCP: [name], NP Referring: [name], NP Visit Date: 11/13/2023   Subjective:  No chief complaint on file.   History of Present Illness: Vanessa Lara is a 68 y.o. female here for follow up ***   Previous HPI 07/04/23 Vanessa Lara is a 68 y.o. female here for follow up for seropositive RA and osteoarthritis and osteoporosis she stopped hydroxychloroquine after last visit due to concern about cardiac arrhythmia alongside Paxil and duloxetine.  She was only able to take a low-dose of the medication she did feel it improved her overall condition but still had persistent arthritis of numerous areas regardless.  She had an updated bone density scan on July 15 still with osteoporosis of the lumbar spine although better than 2022 and with femur osteopenia similar to the previous test.  She is taking Venofer for iron deficiency anemia this ongoing to correct it with her goal of enough to tolerate foot surgery.  She underwent endoscopy with no upper or lower GI findings to account for heme positive stool. Acute problem today increased right ankle pain and swelling started since 3 weeks ago.  She did not recall any fall trip or other trauma to that area.  Initially there was a high degree of swelling he put a wrap around this and can see prominent indentation the swelling has largely decreased compared to the outset but still has pain with direct pressure and with weightbearing.   Imaging reviewed DEXA Review AP Spine L1-L2 04/16/23  -3.8    0.712 g/cm2 04/12/21  -4.5    0.627 g/cm2 04/13/20  -4.2    0.665 g/cm2   DualFemur Total Mean 04/16/23  -2.6    0.680 g/cm2 04/12/21  -2.6    0.676 g/cm2 04/13/20  -2.2    0.736 g/cm2     03/08/23 Vanessa Lara is a 68 y.o. female here for follow up for seropositive RA.  Currently off any disease specific medication she  cannot tolerate taking the sulfasalazine due to severe stomach irritation described as burning up her stomach.  Continues having joint pain and stiffness on a daily basis overall feels her function is very poor due to the severity.  Had labs last month still anemic at hemoglobin of 9.8 being treated with infusion for iron deficiency anemia.   01/16/23 Vanessa Lara is a 67 y.o. female here for follow up for seropositive RA.  Currently health maintenance treatment after discontinuing 5 mg prednisone is on medication for joint pain for RA along with osteoarthritis and chronic pain syndrome.  Had recent knee steroid injection since her last visit but reports overall widespread symptoms are severe.  Particularly is been noticing some increased trouble with pain and some swelling involving the wrist worse on left side.   10/03/22 Vanessa Lara is a 68 y.o. female here for follow up for seropositive RA currently off long term DMARD with prednisone 5 mg daily as needed.  She is overall not doing well feels like pain and stiffness is a big problem but more severe is her fatigue and feels a generalized getting weaker with difficulty moving and with her upper body strength.  She pretty consistently has to take the prednisone for  otherwise does not move around much during the day.  Continues to get a benefit with the Flexeril at nighttime with slight decrease in frequency of awakenings.  She is also concerned about getting an update to her bone density testing ongoing oral alendronate treatment.  She is also still having a lot of pain and difficulty with walking from her claw toe deformities on the foot and planning to follow-up with surgery clinic about management of this.   06/28/22 Vanessa Lara is a 68 y.o. female here for follow up for seropositive RA now off of treatment after stopping the [place].  Since she stopped taking the medication her complaint of night sweats and fatigue have improved. She has seen a  worsening of joint pain in several areas particularly around the MCP joints of both hands. For knee pain she saw orthopedic surgery clinic yesterday and had injections on both sides.  She also discussed getting set up to do right SI joint steroid injection.  Biggest complaint today is back pain on the right side around the middle of the back and around the scapula.  This bothers her especially with certain movements and trying to bend side to side.    05/18/2022  Vanessa Lara is a 68 y.o. female here for follow up for seropositive rheumatoid arthritis on Kevzara 150 mg [place] q14days. She feels arthritis has been fairly well controlled but has pain worse in her left foot mostly. She had GI issues suspected UTI but with negative culture since last visit. She feels a sensation "like her brain is numb." Also has ongoing upper neck pain without radiating symptoms.    02/07/2022 Vanessa Lara is a 68 y.o. female here for follow up for rheumatoid arthritis with chronic joint pain in multiple areas and chronic fatigue labs showing elevated rheumatoid factor. Kevzara new start on 12/15/2021. She felt an improvement when taking the injections within first weeks. Accidentally took the shots twice in a week due to forgetfulness or mistake but did not notice any major problems. She called in on account of increased joint pain but felt no improvement with the prescribed medrol dose pack. She did feel more lightheadedness or off balance feeling while taking this so stopped after about 3 days. Currently back pain and her left foot pain are worst issues.   Previous HPI 12/05/2021 Vanessa Lara is a 68 y.o. female here for evaluation of rheumatoid arthritis with chronic joint pain in multiple areas and chronic fatigue labs showing elevated rheumatoid factor. She has history of mitral valve prolapse, severe osteoporosis, osteoarthritis, and severe weight loss and deconditioning. She was diagnosed with seropositive RA in 2018 with  Dr. Deanne Coffer and most recently saw Dr. Dierdre Forth in 11/2020. At that time recommended to start hydroxychloroquine for inflammatory arthritis symptoms. She previously tried methotrexate, simponi aria, and orencia with poor tolerance. Imaging consistent with degenerative arthritis in cervical spine and knee but also with some partial tendon tear in shoulder and some synovitis present in knee on MRI imaging. She has taken multiple medications for pain including NSAIDs, prednisone, gabapentin, tramadol, and hydrocodone or oxycodone. She has a lot of pain in multiple areas but is also severely fatigued. She has significant weight loss down to 70 lbs unintentionally reports chronic IBS issues but no recent changes during this period of weight loss. Joint pain also limits her activity quite a bit with chronic deformities and also ongoing swelling in bilateral hands. She has also developed left sided headache localized above  the temporal area. She has a lot of pain at the base of the skull and occiput without clear radiation of symptoms. She is scheduled to see Dr. Delena Bali for headache evaluation and management 3/28.   DMARD Hx HCQ SSZ MTX Simponi Orencia Kevzara   05/2017 RF 15 CCP 32 HBV neg HCV neg   No Rheumatology ROS completed.   PMFS History:  Patient Active Problem List   Diagnosis Date Noted   High risk medication use 01/16/2023   Medication monitoring encounter 12/05/2021   Physical deconditioning 11/11/2021   Elevated serum lactate dehydrogenase (LDH) 08/17/2021   Vitamin D deficiency 08/17/2021   Chronic fatigue 07/25/2021   Vitamin B12 deficiency 07/19/2021   Hardening of the aorta (main artery of the heart) (HCC) 06/21/2021   Solitary lung nodule 06/15/2021   Loss of weight 05/18/2021   Protein-calorie malnutrition, severe (HCC) 05/18/2021   Osteoporosis 06/15/2020   Iron deficiency anemia 04/09/2020   Primary osteoarthritis involving multiple joints 12/31/2018   Rheumatoid  arthritis involving multiple sites with positive rheumatoid factor (HCC) 12/31/2018   Irritable bowel syndrome with both constipation and diarrhea 10/28/2018   Constipation 03/14/2016   Mitral valve prolapse 11/23/2015   High cholesterol 11/23/2015    Past Medical History:  Diagnosis Date   Anemia    Anxiety    Arthritis    RA   Bursitis    Cataract    CHF (congestive heart failure) (HCC)    Collagen vascular disease (HCC)    Depression    Endometriosis    Heart murmur    High cholesterol    Hypertension    IBS (irritable bowel syndrome)    Lymphadenopathy    Mitral valve prolapse    Osteoporosis    RA (rheumatoid arthritis) (HCC)    Rotator cuff tear    bilateral   Stroke (HCC) 06/2022   Uterus, adenomyosis     Family History  Problem Relation Age of Onset   Bone cancer Mother    Brain cancer Father    Brain cancer Sister    Breast cancer Sister    Renal Disease Sister    Pancreatic cancer Neg Hx    Esophageal cancer Neg Hx    Stomach cancer Neg Hx    Rectal cancer Neg Hx    Colon polyps Neg Hx    Past Surgical History:  Procedure Laterality Date   AXILLARY LYMPH NODE BIOPSY Right 10/12/2020   Procedure: RIGHT AXILLARY LYMPH NODE BIOPSY EXCISION;  Surgeon: Almond Lint, MD;  Location: [facility];  Service: General;  Laterality: Right;   BUNIONECTOMY Right    CARPAL TUNNEL RELEASE Right    COLONOSCOPY     COLONOSCOPY N/A 12/13/2015   Procedure: COLONOSCOPY;  Surgeon: Malissa Hippo, MD;  [facility];  Service: Endoscopy;  Laterality: N/A;  9:55   Fatty tumor     2011 (left arm)   TONSILLECTOMY     TOTAL ABDOMINAL HYSTERECTOMY     2003   Social History   Social History Narrative   Not on file   Immunization History  Administered Date(s) Administered   Influenza, Quadrivalent, Recombinant, Inj, Pf 07/11/2017     Objective: Vital Signs: There were no vitals taken for this visit.   Physical Exam   Musculoskeletal Exam:  ***  CDAI Exam: CDAI Score: -- Patient Global: --; Provider Global: -- Swollen: --; Tender: -- Joint Exam 11/13/2023   No joint exam has been documented for this visit  There is currently no information documented on the homunculus. Go to the Rheumatology activity and complete the homunculus joint exam.  Investigation: No additional findings.  Imaging: No results found.  Recent Labs: Lab Results  Component Value Date   WBC 12.1 (H) 11/05/2023   HGB 10.3 (L) 11/05/2023   PLT 372 11/05/2023   NA 135 10/05/2023   K 3.5 10/05/2023   CL 103 10/05/2023   CO2 25 10/05/2023   GLUCOSE 99 10/05/2023   BUN 9 10/05/2023   CREATININE 0.46 10/05/2023   BILITOT 0.2 10/05/2023   ALKPHOS 75 10/05/2023   AST 17 10/05/2023   ALT 11 10/05/2023   PROT 6.6 10/05/2023   ALBUMIN 3.3 (L) 10/05/2023   CALCIUM 8.3 (L) 10/05/2023   GFRAA 92 11/06/2019   QFTBGOLDPLUS NEGATIVE 12/05/2021    Speciality Comments: No specialty comments available.  Procedures:  No procedures performed Allergies: Abatacept, Codeine, Golimumab, Hydrocodone, Methylprednisolone, Methotrexate derivatives, and Prednisone   Assessment / Plan:     Visit Diagnoses: No diagnosis found.  ***  Orders: No orders of the defined types were placed in this encounter.  No orders of the defined types were placed in this encounter.    Follow-Up Instructions: No follow-ups on file.   Fuller Plan, MD  Note - This record has been created using [employer].  Chart creation errors have been sought, but may not always  have been located. Such creation errors do not reflect on  the standard of medical care.

## 2023-11-13 NOTE — Telephone Encounter (Signed)
Pt would like to make her appt on 11/19/23 a mychart video appt. Pt was scheduled today but states she is too stiff to come. Pt is requesting a phone call back to approval.

## 2023-11-14 ENCOUNTER — Inpatient Hospital Stay: Payer: Medicare HMO

## 2023-11-14 DIAGNOSIS — D500 Iron deficiency anemia secondary to blood loss (chronic): Secondary | ICD-10-CM

## 2023-11-14 LAB — CBC
HCT: 32.4 % — ABNORMAL LOW (ref 36.0–46.0)
Hemoglobin: 10.2 g/dL — ABNORMAL LOW (ref 12.0–15.0)
MCH: 28.8 pg (ref 26.0–34.0)
MCHC: 31.5 g/dL (ref 30.0–36.0)
MCV: 91.5 fL (ref 80.0–100.0)
Platelets: 595 10*3/uL — ABNORMAL HIGH (ref 150–400)
RBC: 3.54 MIL/uL — ABNORMAL LOW (ref 3.87–5.11)
RDW: 16.2 % — ABNORMAL HIGH (ref 11.5–15.5)
WBC: 11.6 10*3/uL — ABNORMAL HIGH (ref 4.0–10.5)
nRBC: 0 % (ref 0.0–0.2)

## 2023-11-14 LAB — SAMPLE TO BLOOD BANK

## 2023-11-14 NOTE — Telephone Encounter (Signed)
We will need to see her in the office sometime because I cannot tell if she has joint inflammation or what else would be increasing pain without an exam.

## 2023-11-14 NOTE — Telephone Encounter (Signed)
Called pt to let her know her appt needs to be in person. Pts mailbox is full. Unable to leave message.

## 2023-11-15 ENCOUNTER — Telehealth: Payer: Self-pay | Admitting: *Deleted

## 2023-11-15 NOTE — Telephone Encounter (Signed)
Patient called requesting a 3rd dose of Venofer, as she has continued severe weakness.  Discussed with Durenda Hurt, NP who reviewed her chart and agreed to one additional dose.  Patient scheduled for Monday and verbalized understanding.

## 2023-11-16 ENCOUNTER — Encounter: Payer: Self-pay | Admitting: Orthopedic Surgery

## 2023-11-16 ENCOUNTER — Ambulatory Visit: Payer: Medicare HMO | Admitting: Surgical

## 2023-11-16 ENCOUNTER — Other Ambulatory Visit: Payer: Self-pay

## 2023-11-16 DIAGNOSIS — M25571 Pain in right ankle and joints of right foot: Secondary | ICD-10-CM | POA: Diagnosis not present

## 2023-11-16 MED ORDER — LIDOCAINE HCL 1 % IJ SOLN
2.0000 mL | INTRAMUSCULAR | Status: AC | PRN
  Administered 2023-11-16: 2 mL

## 2023-11-16 MED ORDER — METHYLPREDNISOLONE ACETATE 40 MG/ML IJ SUSP
40.0000 mg | INTRAMUSCULAR | Status: AC | PRN
  Administered 2023-11-16: 40 mg via INTRA_ARTICULAR

## 2023-11-16 NOTE — Progress Notes (Signed)
Office Visit Note   Patient: Vanessa Lara           Date of Birth: [birth year]           MRN: [id number] Visit Date: 11/12/2023              Requested by: [name], NP [address] [place],  [place] [address] PCP: [name], NP  Chief Complaint  Patient presents with   Right Foot - Pain      HPI: Patient presents with medial right ankle pain.  She states that swollen.  She states she has arthritis everywhere.  She uses a fentanyl patch and currently is wearing a lidocaine patch circumferentially around her ankle.  Assessment & Plan: Visit Diagnoses:  1. Pain in right ankle and joints of right foot     Plan: Right ankle was injected.  Recommended against forefoot surgery at this time.  Follow-Up Instructions: Return in about 4 weeks (around 12/10/2023).   Ortho Exam  Patient is alert, oriented, no adenopathy, well-dressed, normal affect, normal respiratory effort. Examination patient has acute pain and swelling of the right ankle.  She has pain to palpation over varicose veins.  Pain to palpation of the anterior ankle.  Most recent hemoglobin is 10.2.  She has a palpable dorsalis pedis pulse with clawing and angular deformities of her toes.  Imaging: No results found. No images are attached to the encounter.  Labs: Lab Results  Component Value Date   HGBA1C 5.1 11/02/2022   HGBA1C 5.7 (H) 03/19/2020   HGBA1C 5.5 11/06/2019   ESRSEDRATE 63 (H) 10/05/2023   ESRSEDRATE 82 (H) 07/04/2023   ESRSEDRATE 55 (H) 01/16/2023   CRP 4.4 10/03/2022   CRP 32.9 (H) 12/05/2021   CRP 1.7 (H) 08/23/2020     Lab Results  Component Value Date   ALBUMIN 3.3 (L) 10/05/2023   ALBUMIN 3.6 06/14/2023   ALBUMIN 3.8 05/21/2023    Lab Results  Component Value Date   MG 2.0 06/14/2023   Lab Results  Component Value Date   VD25OH 80 07/04/2023   VD25OH 72.68 02/02/2023   VD25OH 62 05/18/2022    No results found for: "PREALBUMIN"    Latest Ref Rng &  Units 11/14/2023   12:25 PM 11/05/2023   11:14 AM 11/01/2023   12:34 PM  CBC EXTENDED  WBC 4.0 - 10.5 K/uL 11.6  12.1  8.8   RBC 3.87 - 5.11 MIL/uL 3.54  3.62  3.44   Hemoglobin 12.0 - 15.0 g/dL 30.8  65.7  84.6   HCT 36.0 - 46.0 % 32.4  33.8  31.6   Platelets 150 - 400 K/uL 595  372  401   NEUT# 1.7 - 7.7 K/uL   7.0   Lymph# 0.7 - 4.0 K/uL   1.2      Body mass index is 13.01 kg/m.  Orders:  No orders of the defined types were placed in this encounter.  No orders of the defined types were placed in this encounter.    Procedures: Medium Joint Inj: R ankle on 11/16/2023 5:01 PM Indications: pain and diagnostic evaluation Details: 22 G 1.5 in needle, anteromedial approach Medications: 2 mL lidocaine 1 %; 40 mg methylPREDNISolone acetate 40 MG/ML Outcome: tolerated well, no immediate complications Procedure, treatment alternatives, risks and benefits explained, specific risks discussed. Consent was given by the patient. Immediately prior to procedure a time out was called to verify the correct patient, procedure, equipment,  support staff and site/side marked as required. Patient was prepped and draped in the usual sterile fashion.      Clinical Data: No additional findings.  ROS:  All other systems negative, except as noted in the HPI. Review of Systems  Objective: Vital Signs: Ht 5' (1.524 m)   Wt 66 lb 9.6 oz (30.2 kg)   BMI 13.01 kg/m   Specialty Comments:  MRI CERVICAL SPINE WITHOUT CONTRAST   TECHNIQUE: Multiplanar, multisequence MR imaging of the cervical spine was performed. No intravenous contrast was administered.   COMPARISON:  None.   FINDINGS: Alignment: 2 mm retrolisthesis of C5 on C6.   Vertebrae: No acute fracture, evidence of discitis, or bone lesion.   Cord: Normal signal and morphology.   Posterior Fossa, vertebral arteries, paraspinal tissues: Posterior fossa demonstrates no focal abnormality. Vertebral artery flow voids are maintained.  Paraspinal soft tissues are unremarkable.   Disc levels:   Discs: Degenerative disease with disc height loss at C4-5 and C5-6.   C2-3: No significant disc bulge. No neural foraminal stenosis. No central canal stenosis.   C3-4: Mild broad-based disc bulge. No foraminal or central canal stenosis.   C4-5: Mild broad-based disc bulge. No foraminal or central canal stenosis.   C5-6: Mild broad-based disc bulge. Bilateral uncovertebral degenerative changes. Moderate left and severe right foraminal stenosis. No spinal stenosis.   C6-7: Mild broad-based disc bulge. No foraminal or central canal stenosis.   C7-T1: No significant disc bulge. No neural foraminal stenosis. No central canal stenosis.   IMPRESSION: 1. At C5-6 there is a mild broad-based disc bulge. Bilateral uncovertebral degenerative changes. Moderate left and severe right foraminal stenosis. 2. Otherwise, mild cervical spine spondylosis as described above.     Electronically Signed   By: Elige Ko M.D.   On: 12/16/2021 10:51  PMFS History: Patient Active Problem List   Diagnosis Date Noted   High risk medication use 01/16/2023   Medication monitoring encounter 12/05/2021   Physical deconditioning 11/11/2021   Elevated serum lactate dehydrogenase (LDH) 08/17/2021   Vitamin D deficiency 08/17/2021   Chronic fatigue 07/25/2021   Vitamin B12 deficiency 07/19/2021   Hardening of the aorta (main artery of the heart) (HCC) 06/21/2021   Solitary lung nodule 06/15/2021   Loss of weight 05/18/2021   Protein-calorie malnutrition, severe (HCC) 05/18/2021   Osteoporosis 06/15/2020   Iron deficiency anemia 04/09/2020   Primary osteoarthritis involving multiple joints 12/31/2018   Rheumatoid arthritis involving multiple sites with positive rheumatoid factor (HCC) 12/31/2018   Irritable bowel syndrome with both constipation and diarrhea 10/28/2018   Constipation 03/14/2016   Mitral valve prolapse 11/23/2015   High  cholesterol 11/23/2015   Past Medical History:  Diagnosis Date   Anemia    Anxiety    Arthritis    RA   Bursitis    Cataract    CHF (congestive heart failure) (HCC)    Collagen vascular disease (HCC)    Depression    Endometriosis    Heart murmur    High cholesterol    Hypertension    IBS (irritable bowel syndrome)    Lymphadenopathy    Mitral valve prolapse    Osteoporosis    RA (rheumatoid arthritis) (HCC)    Rotator cuff tear    bilateral   Stroke (HCC) 06/2022   Uterus, adenomyosis     Family History  Problem Relation Age of Onset   Bone cancer Mother    Brain cancer Father    Brain cancer Sister  Breast cancer Sister    Renal Disease Sister    Pancreatic cancer Neg Hx    Esophageal cancer Neg Hx    Stomach cancer Neg Hx    Rectal cancer Neg Hx    Colon polyps Neg Hx     Past Surgical History:  Procedure Laterality Date   AXILLARY LYMPH NODE BIOPSY Right 10/12/2020   Procedure: RIGHT AXILLARY LYMPH NODE BIOPSY EXCISION;  Surgeon: Almond Lint, MD;  Location: [facility];  Service: General;  Laterality: Right;   BUNIONECTOMY Right    CARPAL TUNNEL RELEASE Right    COLONOSCOPY     COLONOSCOPY N/A 12/13/2015   Procedure: COLONOSCOPY;  Surgeon: Malissa Hippo, MD;  [facility];  Service: Endoscopy;  Laterality: N/A;  9:55   Fatty tumor     2011 (left arm)   TONSILLECTOMY     TOTAL ABDOMINAL HYSTERECTOMY     2003   Social History   Occupational History   Occupation: retired  Tobacco Use   Smoking status: Some Days    Current packs/day: 0.00    Average packs/day: 0.5 packs/day for 33.0 years (16.5 ttl pk-yrs)    Types: Cigarettes    Start date: 44    Last attempt to quit: 2013    Years since quitting: 12.1   Smokeless tobacco: Never  Vaping Use   Vaping status: Never Used  Substance and Sexual Activity   Alcohol use: No    Alcohol/week: 0.0 standard drinks of alcohol   Drug use: No   Sexual activity: Not Currently

## 2023-11-18 NOTE — Progress Notes (Signed)
 Office Visit Note  Patient: Vanessa Lara             Date of Birth: 01-04-1956           MRN: [id number]             PCP: [name], NP Referring: [name], NP Visit Date: 11/19/2023   Subjective:  Follow-up (Patient states she would like to talk about the hydroxychloroquine. )   Discussed the use of AI scribe software for clinical note transcription with the patient, who gave verbal consent to proceed.  History of Present Illness   Vanessa Lara is a 68 year old female with rheumatoid arthritis on hydroxychloroquine 200 mg once daily for 5 days per week.  She experiences significant pain and stiffness, particularly on Sundays, leading her to take an additional half dose of her medication on Sundays for the past two weekends. She describes waking up with severe stiffness and being unable to move by Monday if she does not take the extra dose. She is seeking an alternative medication to manage her symptoms more effectively.  She describes pain associated with bursitis and possible rotator cuff issues, noting a pressure point that radiates from her shoulder down towards her armpit and into her upper arm, forming a 'V' shape. She is scheduled to receive an injection for this pain.  She reports issues with her foot, including a previously twisted or sprained ankle that healed but then became painful and red, particularly around a bone that feels bruised. She also mentions a rod in her big toe that has shifted, causing pain and misalignment of her toes. She was previously told that surgery could correct her toes, but now her arthritis and varicose veins have worsened, raising concerns about circulation, infection, and potential amputation. Her bone density is very low, complicating surgical options.  She mentions her knuckles are sore, with one being particularly bad, and she experiences pain that radiates down her ear. She also notes fluctuations in her weight, questioning  whether it is due to scale differences or her condition.    Previous HPI 07/04/23 Vanessa Lara is a 68 y.o. female here for follow up for seropositive RA and osteoarthritis and osteoporosis she stopped hydroxychloroquine after last visit due to concern about cardiac arrhythmia alongside Paxil and duloxetine.  She was only able to take a low-dose of the medication she did feel it improved her overall condition but still had persistent arthritis of numerous areas regardless.  She had an updated bone density scan on July 15 still with osteoporosis of the lumbar spine although better than 2022 and with femur osteopenia similar to the previous test.  She is taking Venofer for iron deficiency anemia this ongoing to correct it with her goal of enough to tolerate foot surgery.  She underwent endoscopy with no upper or lower GI findings to account for heme positive stool. Acute problem today increased right ankle pain and swelling started since 3 weeks ago.  She did not recall any fall trip or other trauma to that area.  Initially there was a high degree of swelling he put a wrap around this and can see prominent indentation the swelling has largely decreased compared to the outset but still has pain with direct pressure and with weightbearing.   Imaging reviewed DEXA Review [facility] Spine L1-L2 04/16/23  -3.8    0.712 g/cm2 04/12/21  -4.5    0.627 g/cm2 04/13/20  -4.2    0.665 g/cm2  DualFemur Total Mean 04/16/23  -2.6    0.680 g/cm2 04/12/21  -2.6    0.676 g/cm2 04/13/20  -2.2    0.736 g/cm2     03/08/23 Vanessa Lara is a 68 y.o. female here for follow up for seropositive RA.  Currently off any disease specific medication she cannot tolerate taking the sulfasalazine due to severe stomach irritation described as burning up her stomach.  Continues having joint pain and stiffness on a daily basis overall feels her function is very poor due to the severity.  Had labs last month still anemic at hemoglobin of 9.8  being treated with infusion for iron deficiency anemia.   01/16/23 Vanessa Lara is a 68 y.o. female here for follow up for seropositive RA.  Currently health maintenance treatment after discontinuing 5 mg prednisone is on medication for joint pain for RA along with osteoarthritis and chronic pain syndrome.  Had recent knee steroid injection since her last visit but reports overall widespread symptoms are severe.  Particularly is been noticing some increased trouble with pain and some swelling involving the wrist worse on left side.   10/03/22 Vanessa Lara is a 68 y.o. female here for follow up for seropositive RA currently off long term DMARD with prednisone 5 mg daily as needed.  She is overall not doing well feels like pain and stiffness is a big problem but more severe is her fatigue and feels a generalized getting weaker with difficulty moving and with her upper body strength.  She pretty consistently has to take the prednisone for otherwise does not move around much during the day.  Continues to get a benefit with the Flexeril at nighttime with slight decrease in frequency of awakenings.  She is also concerned about getting an update to her bone density testing ongoing oral alendronate treatment.  She is also still having a lot of pain and difficulty with walking from her claw toe deformities on the foot and planning to follow-up with surgery clinic about management of this.   06/28/22 Vanessa Lara is a 68 y.o. female here for follow up for seropositive RA now off of treatment after stopping the [place].  Since she stopped taking the medication her complaint of night sweats and fatigue have improved. She has seen a worsening of joint pain in several areas particularly around the MCP joints of both hands. For knee pain she saw orthopedic surgery clinic yesterday and had injections on both sides.  She also discussed getting set up to do right SI joint steroid injection.  Biggest complaint today is back  pain on the right side around the middle of the back and around the scapula.  This bothers her especially with certain movements and trying to bend side to side.    05/18/2022  LANETTA FIGUERO is a 68 y.o. female here for follow up for seropositive rheumatoid arthritis on Kevzara 150 mg [place] q14days. She feels arthritis has been fairly well controlled but has pain worse in her left foot mostly. She had GI issues suspected UTI but with negative culture since last visit. She feels a sensation "like her brain is numb." Also has ongoing upper neck pain without radiating symptoms.    02/07/2022 AHLAYAH TARKOWSKI is a 68 y.o. female here for follow up for rheumatoid arthritis with chronic joint pain in multiple areas and chronic fatigue labs showing elevated rheumatoid factor. Kevzara new start on 12/15/2021. She felt an improvement when taking the injections within first weeks.  Accidentally took the shots twice in a week due to forgetfulness or mistake but did not notice any major problems. She called in on account of increased joint pain but felt no improvement with the prescribed medrol dose pack. She did feel more lightheadedness or off balance feeling while taking this so stopped after about 3 days. Currently back pain and her left foot pain are worst issues.   Previous HPI 12/05/2021 PIERRETTE SCHEU is a 68 y.o. female here for evaluation of rheumatoid arthritis with chronic joint pain in multiple areas and chronic fatigue labs showing elevated rheumatoid factor. She has history of mitral valve prolapse, severe osteoporosis, osteoarthritis, and severe weight loss and deconditioning. She was diagnosed with seropositive RA in 2018 with Dr. Deanne Coffer and most recently saw Dr. Dierdre Forth in 11/2020. At that time recommended to start hydroxychloroquine for inflammatory arthritis symptoms. She previously tried methotrexate, simponi aria, and orencia with poor tolerance. Imaging consistent with degenerative arthritis in cervical  spine and knee but also with some partial tendon tear in shoulder and some synovitis present in knee on MRI imaging. She has taken multiple medications for pain including NSAIDs, prednisone, gabapentin, tramadol, and hydrocodone or oxycodone. She has a lot of pain in multiple areas but is also severely fatigued. She has significant weight loss down to 70 lbs unintentionally reports chronic IBS issues but no recent changes during this period of weight loss. Joint pain also limits her activity quite a bit with chronic deformities and also ongoing swelling in bilateral hands. She has also developed left sided headache localized above the temporal area. She has a lot of pain at the base of the skull and occiput without clear radiation of symptoms. She is scheduled to see Dr. Delena Bali for headache evaluation and management 3/28.   DMARD Hx HCQ SSZ MTX Simponi Orencia Kevzara   05/2017 RF 15 CCP 32 HBV neg HCV neg   Review of Systems  Constitutional:  Positive for fatigue.  HENT:  Positive for mouth dryness. Negative for mouth sores.   Eyes:  Positive for dryness.  Respiratory:  Positive for shortness of breath.   Cardiovascular:  Positive for chest pain and palpitations.  Gastrointestinal:  Positive for blood in stool, constipation and diarrhea.  Endocrine: Negative for increased urination.  Genitourinary:  Negative for involuntary urination.  Musculoskeletal:  Positive for joint pain, gait problem, joint pain, joint swelling, myalgias, muscle weakness, morning stiffness and myalgias. Negative for muscle tenderness.  Skin:  Positive for sensitivity to sunlight. Negative for color change, rash and hair loss.  Allergic/Immunologic: Negative for susceptible to infections.  Neurological:  Positive for dizziness and headaches.  Hematological:  Negative for swollen glands.  Psychiatric/Behavioral:  Positive for sleep disturbance. Negative for depressed mood. The patient is nervous/anxious.      PMFS History:  Patient Active Problem List   Diagnosis Date Noted   High risk medication use 01/16/2023   Medication monitoring encounter 12/05/2021   Physical deconditioning 11/11/2021   Elevated serum lactate dehydrogenase (LDH) 08/17/2021   Vitamin D deficiency 08/17/2021   Chronic fatigue 07/25/2021   Vitamin B12 deficiency 07/19/2021   Hardening of the aorta (main artery of the heart) (HCC) 06/21/2021   Solitary lung nodule 06/15/2021   Loss of weight 05/18/2021   Protein-calorie malnutrition, severe (HCC) 05/18/2021   Osteoporosis 06/15/2020   Iron deficiency anemia 04/09/2020   Primary osteoarthritis involving multiple joints 12/31/2018   Rheumatoid arthritis involving multiple sites with positive rheumatoid factor (HCC) 12/31/2018   Irritable  bowel syndrome with both constipation and diarrhea 10/28/2018   Constipation 03/14/2016   Mitral valve prolapse 11/23/2015   High cholesterol 11/23/2015    Past Medical History:  Diagnosis Date   Anemia    Anxiety    Arthritis    RA   Bursitis    Cataract    CHF (congestive heart failure) (HCC)    Collagen vascular disease (HCC)    Depression    Endometriosis    Heart murmur    High cholesterol    Hypertension    IBS (irritable bowel syndrome)    Lymphadenopathy    Mitral valve prolapse    Osteoporosis    RA (rheumatoid arthritis) (HCC)    Rotator cuff tear    bilateral   Stroke (HCC) 06/2022   Uterus, adenomyosis     Family History  Problem Relation Age of Onset   Bone cancer Mother    Brain cancer Father    Brain cancer Sister    Breast cancer Sister    Renal Disease Sister    Pancreatic cancer Neg Hx    Esophageal cancer Neg Hx    Stomach cancer Neg Hx    Rectal cancer Neg Hx    Colon polyps Neg Hx    Past Surgical History:  Procedure Laterality Date   AXILLARY LYMPH NODE BIOPSY Right 10/12/2020   Procedure: RIGHT AXILLARY LYMPH NODE BIOPSY EXCISION;  Surgeon: Almond Lint, MD;  Location: [facility] [facility];  Service: General;  Laterality: Right;   BUNIONECTOMY Right    CARPAL TUNNEL RELEASE Right    COLONOSCOPY     COLONOSCOPY N/A 12/13/2015   Procedure: COLONOSCOPY;  Surgeon: Malissa Hippo, MD;  Location: [facility];  Service: Endoscopy;  Laterality: N/A;  9:55   Fatty tumor     2011 (left arm)   TONSILLECTOMY     TOTAL ABDOMINAL HYSTERECTOMY     2003   Social History   Social History Narrative   Not on file   Immunization History  Administered Date(s) Administered   Influenza, Quadrivalent, Recombinant, Inj, Pf 07/11/2017     Objective: Vital Signs: BP 97/64 (BP Location: Left Arm, Patient Position: Sitting, Cuff Size: Small)   Pulse (!) 102   Resp 14   Ht 5' (1.524 m)   Wt 64 lb (29 kg)   BMI 12.50 kg/m    Physical Exam Constitutional:      Comments: Underweight  Cardiovascular:     Rate and Rhythm: Normal rate and regular rhythm.  Pulmonary:     Effort: Pulmonary effort is normal.     Breath sounds: Normal breath sounds.  Musculoskeletal:     Right lower leg: No edema.     Left lower leg: No edema.  Skin:    General: Skin is warm and dry.  Neurological:     Mental Status: She is alert.  Psychiatric:        Mood and Affect: Mood normal.      Musculoskeletal Exam:  Elbows full ROM no tenderness or swelling Wrists full ROM no tenderness or swelling Fingers chronic MCP joint thickening, right hand with reducible subluxation and lateral deviation, left hand worse lateral deviation and is not reducible Knees full ROM patellofemoral crepitus, no palpable effusion Right ankle tenderness to pressure worse on medial side along lower and posterior border of medial malleolus Cocked up toe deformities from the second through fourth MTP joints   Investigation: No additional findings.  Imaging: MR SHOULDER LEFT  WO CONTRAST Result Date: 11/19/2023 CLINICAL DATA:  Status post fall, left shoulder pain since December EXAM: MRI OF THE LEFT  SHOULDER WITHOUT CONTRAST TECHNIQUE: Multiplanar, multisequence MR imaging of the shoulder was performed. No intravenous contrast was administered. COMPARISON:  01/10/2021 FINDINGS: Rotator cuff: Moderate supraspinatus and infraspinatus tendinosis without a tear. Teres minor tendon is intact. Subscapularis tendon is intact. Muscles: No muscle atrophy or edema. No intramuscular fluid collection or hematoma. Biceps Long Head: Proximal long of the biceps tendon not well visualized concerning for a complete tear. Acromioclavicular Joint: No significant arthropathy of the acromioclavicular joint. Trace subacromial/subdeltoid bursal fluid. Glenohumeral Joint: No joint effusion. Mild partial-thickness cartilage loss of the glenohumeral joint. Labrum: Grossly intact, but evaluation is limited by lack of intraarticular fluid/contrast. Bones: No fracture or dislocation. No aggressive osseous lesion. Other: No fluid collection or hematoma. IMPRESSION: 1. Moderate supraspinatus and infraspinatus tendinosis without a tear. 2. Proximal long of the biceps tendon not well visualized concerning for a complete tear. Electronically Signed   By: Elige Ko M.D.   On: 11/19/2023 12:45    Recent Labs: Lab Results  Component Value Date   WBC 15.9 (H) 11/19/2023   HGB 10.0 (L) 11/19/2023   PLT 548 (H) 11/19/2023   NA 135 11/19/2023   K 3.3 (L) 11/19/2023   CL 100 11/19/2023   CO2 25 11/19/2023   GLUCOSE 127 (H) 11/19/2023   BUN 9 11/19/2023   CREATININE 0.49 11/19/2023   BILITOT 0.3 11/19/2023   ALKPHOS 106 11/19/2023   AST 20 11/19/2023   ALT 11 11/19/2023   PROT 7.3 11/19/2023   ALBUMIN 3.6 11/19/2023   CALCIUM 8.9 11/19/2023   GFRAA 92 11/06/2019   QFTBGOLDPLUS NEGATIVE 12/05/2021    Speciality Comments: No specialty comments available.  Procedures:  No procedures performed Allergies: Abatacept, Codeine, Golimumab, Hydrocodone, Methylprednisolone, Methotrexate derivatives, and Prednisone   Assessment /  Plan:     Visit Diagnoses: Rheumatoid arthritis involving multiple sites with positive rheumatoid factor (HCC)  Increased pain and stiffness, particularly on weekends. Current treatment with Plaquenil not providing sufficient relief. No prior use of azathioprine (Imuran). -Continue HCQ 5 doses weekly -Check TPMT enzyme level to assess suitability for azathioprine. -If TPMT level is normal, start azathioprine at 25mg  daily, with potential to increase to 50mg  daily if well-tolerated. -Monitor white blood cell count and red blood cell count for potential side effects of azathioprine  High risk medication use - Plan: CANCELED: COMPLETE METABOLIC PANEL WITH GFR, CANCELED: Thiopurine methyltransferase(tpmt)rbc Discussed risk of azathioprine including cytopenias hepatotoxicity, GI intolerance, infection risk, malignancy risk with long-term use. -Checking baseline TPMT enzyme assay with upcoming labs -Checking CMP medication monitoring on continued Plaquenil and for starting azathioprine  Primary osteoarthritis involving multiple joints Foot Pain Chronic foot pain and deformity, with worsening pain in the context of a suspected sprained or twisted ankle. Previous orthotics provided temporary relief but are no longer effective. -Explore options for custom orthotics, potentially through sports medicine or orthotics specialist. -Continue current pain management strategies.  Shoulder Pain Pain and pressure in the shoulder, potentially related to bursitis or rotator cuff inflammation. Scheduled for an injection. -Continue with planned injection. -Monitor response to injection and adjust treatment plan as necessary.   Orders: No orders of the defined types were placed in this encounter.  No orders of the defined types were placed in this encounter.    Follow-Up Instructions: Return in about 3 months (around 02/16/2024) for RA on HCQ/AZA start f/u 3mos.   Cristal Deer  Jeanine Luz, MD  Note - This  record has been created using Animal nutritionist.  Chart creation errors have been sought, but may not always  have been located. Such creation errors do not reflect on  the standard of medical care.

## 2023-11-19 ENCOUNTER — Other Ambulatory Visit: Payer: Self-pay | Admitting: *Deleted

## 2023-11-19 ENCOUNTER — Inpatient Hospital Stay: Payer: Medicare HMO | Admitting: Physician Assistant

## 2023-11-19 ENCOUNTER — Inpatient Hospital Stay: Payer: Medicare HMO

## 2023-11-19 ENCOUNTER — Inpatient Hospital Stay: Payer: Medicare HMO | Admitting: Hematology

## 2023-11-19 ENCOUNTER — Encounter: Payer: Self-pay | Admitting: Internal Medicine

## 2023-11-19 ENCOUNTER — Encounter: Payer: Medicare HMO | Attending: Gastroenterology | Admitting: Internal Medicine

## 2023-11-19 ENCOUNTER — Telehealth: Payer: Self-pay

## 2023-11-19 VITALS — BP 97/64 | HR 102 | Resp 14 | Ht 60.0 in | Wt <= 1120 oz

## 2023-11-19 VITALS — BP 90/46 | HR 96 | Temp 97.0°F | Resp 18

## 2023-11-19 DIAGNOSIS — Z79899 Other long term (current) drug therapy: Secondary | ICD-10-CM

## 2023-11-19 DIAGNOSIS — D500 Iron deficiency anemia secondary to blood loss (chronic): Secondary | ICD-10-CM

## 2023-11-19 DIAGNOSIS — M0579 Rheumatoid arthritis with rheumatoid factor of multiple sites without organ or systems involvement: Secondary | ICD-10-CM | POA: Diagnosis not present

## 2023-11-19 DIAGNOSIS — M150 Primary generalized (osteo)arthritis: Secondary | ICD-10-CM | POA: Diagnosis not present

## 2023-11-19 DIAGNOSIS — D649 Anemia, unspecified: Secondary | ICD-10-CM

## 2023-11-19 DIAGNOSIS — Z7969 Long term (current) use of other immunomodulators and immunosuppressants: Secondary | ICD-10-CM

## 2023-11-19 LAB — CBC WITH DIFFERENTIAL/PLATELET
Abs Immature Granulocytes: 0.08 10*3/uL — ABNORMAL HIGH (ref 0.00–0.07)
Basophils Absolute: 0.1 10*3/uL (ref 0.0–0.1)
Basophils Relative: 0 %
Eosinophils Absolute: 0.1 10*3/uL (ref 0.0–0.5)
Eosinophils Relative: 0 %
HCT: 32.2 % — ABNORMAL LOW (ref 36.0–46.0)
Hemoglobin: 10 g/dL — ABNORMAL LOW (ref 12.0–15.0)
Immature Granulocytes: 1 %
Lymphocytes Relative: 13 %
Lymphs Abs: 2.1 10*3/uL (ref 0.7–4.0)
MCH: 28.2 pg (ref 26.0–34.0)
MCHC: 31.1 g/dL (ref 30.0–36.0)
MCV: 90.7 fL (ref 80.0–100.0)
Monocytes Absolute: 0.6 10*3/uL (ref 0.1–1.0)
Monocytes Relative: 4 %
Neutro Abs: 13 10*3/uL — ABNORMAL HIGH (ref 1.7–7.7)
Neutrophils Relative %: 82 %
Platelets: 548 10*3/uL — ABNORMAL HIGH (ref 150–400)
RBC: 3.55 MIL/uL — ABNORMAL LOW (ref 3.87–5.11)
RDW: 16.8 % — ABNORMAL HIGH (ref 11.5–15.5)
WBC: 15.9 10*3/uL — ABNORMAL HIGH (ref 4.0–10.5)
nRBC: 0 % (ref 0.0–0.2)

## 2023-11-19 LAB — IRON AND TIBC
Iron: 24 ug/dL — ABNORMAL LOW (ref 28–170)
Saturation Ratios: 11 % (ref 10.4–31.8)
TIBC: 222 ug/dL — ABNORMAL LOW (ref 250–450)
UIBC: 198 ug/dL

## 2023-11-19 LAB — COMPREHENSIVE METABOLIC PANEL
ALT: 11 U/L (ref 0–44)
AST: 20 U/L (ref 15–41)
Albumin: 3.6 g/dL (ref 3.5–5.0)
Alkaline Phosphatase: 106 U/L (ref 38–126)
Anion gap: 10 (ref 5–15)
BUN: 9 mg/dL (ref 8–23)
CO2: 25 mmol/L (ref 22–32)
Calcium: 8.9 mg/dL (ref 8.9–10.3)
Chloride: 100 mmol/L (ref 98–111)
Creatinine, Ser: 0.49 mg/dL (ref 0.44–1.00)
GFR, Estimated: >60 mL/min (ref >60)
Glucose, Bld: 127 mg/dL — ABNORMAL HIGH (ref 70–99)
Potassium: 3.3 mmol/L — ABNORMAL LOW (ref 3.5–5.1)
Sodium: 135 mmol/L (ref 135–145)
Total Bilirubin: 0.3 mg/dL (ref 0.0–1.2)
Total Protein: 7.3 g/dL (ref 6.5–8.1)

## 2023-11-19 LAB — SEDIMENTATION RATE: Sed Rate: 55 mm/h — ABNORMAL HIGH (ref 0–22)

## 2023-11-19 LAB — FERRITIN: Ferritin: 232 ng/mL (ref 11–307)

## 2023-11-19 LAB — LACTATE DEHYDROGENASE: LDH: 151 U/L (ref 98–192)

## 2023-11-19 MED ORDER — SODIUM CHLORIDE 0.9 % IV SOLN: 300.0000 mg | Freq: Once | INTRAVENOUS | Status: DC

## 2023-11-19 MED ORDER — IRON SUCROSE 20 MG/ML IV SOLN
200.0000 mg | Freq: Once | INTRAVENOUS | Status: AC
  Administered 2023-11-19: 200 mg via INTRAVENOUS
  Filled 2023-11-19: qty 10

## 2023-11-19 MED ORDER — SODIUM CHLORIDE 0.9 % IV SOLN: INTRAVENOUS | Status: DC

## 2023-11-19 MED ORDER — CETIRIZINE HCL 10 MG PO TABS: 10.0000 mg | ORAL_TABLET | Freq: Once | ORAL | Status: DC

## 2023-11-19 MED ORDER — ACETAMINOPHEN 325 MG PO TABS: 650.0000 mg | ORAL_TABLET | Freq: Once | ORAL | Status: DC

## 2023-11-19 NOTE — Patient Instructions (Signed)

## 2023-11-19 NOTE — Progress Notes (Signed)
Per pt she took her premedications at home prior to appointment. Labs reviewed with Lupe Carney, NP and treatment team. Venofer dose adjusted per NP.   Patient tolerated iron infusion with no complaints voiced.  Peripheral IV site clean and dry with good blood return noted before and after infusion.  Band aid applied.  Pt refused to wait the 30 minute post iron observation. VSS with discharge and left in satisfactory condition with no s/s of distress noted. All follow ups as scheduled.   Jkayla Spiewak [employer]

## 2023-11-19 NOTE — Patient Instructions (Signed)
Azathioprine Tablets What is this medication? AZATHIOPRINE (ay za THYE oh preen) prevents the body from rejecting an organ transplant. It works by lowering the body's immune system response. This helps the body accept the donor organ. It may also be used to treat rheumatoid arthritis. This medicine may be used for other purposes; ask your health care provider or pharmacist if you have questions. COMMON BRAND NAME(S): Azasan, Imuran What should I tell my care team before I take this medication? They need to know if you have any of these conditions: Infection Kidney disease Liver disease An unusual or allergic reaction to azathioprine, lactose, other medications, foods, dyes, or preservatives Pregnant or trying to get pregnant Breastfeeding How should I use this medication? Take this medication by mouth with a full glass of water. Take it as directed on the prescription label at the same time every day. Keep taking it unless your care team tells you to stop. Keep taking it even if you think you are better. Talk to your care team about the use of this medication in children. Special care may be needed. Overdosage: If you think you have taken too much of this medicine contact a poison control center or emergency room at once. NOTE: This medicine is only for you. Do not share this medicine with others. What if I miss a dose? If you miss a dose, take it as soon as you can. If it is almost time for your next dose, take only that dose. Do not take double or extra doses. What may interact with this medication? Do not take this medication with any of the following: Febuxostat Mercaptopurine This medication may also interact with the following: Allopurinol Aminosalicylates, such as sulfasalazine, mesalamine, balsalazide, and olsalazine Leflunomide Medications called ACE inhibitors, such as benazepril, captopril, enalapril, fosinopril, quinapril, lisinopril, ramipril, and  trandolapril Mycophenolate Sulfamethoxazole; trimethoprim Vaccines Warfarin This list may not describe all possible interactions. Give your health care provider a list of all the medicines, herbs, non-prescription drugs, or dietary supplements you use. Also tell them if you smoke, drink alcohol, or use illegal drugs. Some items may interact with your medicine. What should I watch for while using this medication? Visit your care team for regular checks on your progress. You may need blood work done while you are taking this medication. This medication may increase your risk of getting an infection. Call your care team for advice if you get a fever, chills, sore throat, or other symptoms of a cold or flu. Do not treat yourself. Try to avoid being around people who are sick. Talk to your care team about your risk of cancer. You may be more at risk for certain types of cancer if you take this medication. Talk to your care team if you may be pregnant. This medication can cause serious birth defects if taken during pregnancy. This medication may cause infertility. Talk to your care team if you are concerned about your fertility. What side effects may I notice from receiving this medication? Side effects that you should report to your care team as soon as possible: Allergic reactions--skin rash, itching, hives, swelling of the face, lips, tongue, or throat Change in your skin, such as a new growth, a sore that doesn't heal, or a change in a mole Dizziness, loss of balance or coordination, confusion or trouble speaking Infection--fever, chills, cough, sore throat, wounds that don't heal, pain or trouble when passing urine, general feeling of discomfort or being unwell Low red blood cell level--unusual  weakness or fatigue, dizziness, headache, trouble breathing Unusual bruising or bleeding Side effects that usually do not require medical attention (report to your care team if they continue or are  bothersome): Diarrhea Fatigue Nausea Vomiting This list may not describe all possible side effects. Call your doctor for medical advice about side effects. You may report side effects to FDA at 1-800-FDA-1088. Where should I keep my medication? Keep out of the reach of children and pets. Store at room temperature between 15 and 25 degrees C (59 and 77 degrees F). Protect from light. Get rid of any unused medication after the expiration date. To get rid of medications that are no longer needed or have expired: Take the medication to a medication take-back program. Check with your pharmacy or law enforcement to find a location. If you cannot return the medication, check the label or package insert to see if the medication should be thrown out in the garbage or flushed down the toilet. If you are not sure, ask your care team. If it is safe to put it in the trash, empty the medication out of the container. Mix the medication with cat litter, dirt, coffee grounds, or other unwanted substance. Seal the mixture in a bag or container. Put it in the trash. NOTE: This sheet is a summary. It may not cover all possible information. If you have questions about this medicine, talk to your doctor, pharmacist, or health care provider.  2024 Elsevier/Gold Standard (2022-02-21 00:00:00)

## 2023-11-19 NOTE — Telephone Encounter (Signed)
Pending labs results, patient will possibly be starting Imuran per Dr. Dimple Casey.   Consent obtained and sent to the scan center. Thanks!

## 2023-11-20 ENCOUNTER — Telehealth: Payer: Self-pay

## 2023-11-20 NOTE — Telephone Encounter (Signed)
Patient contacted the office regarding her lab results. Patient was confused why she could only see one result. Advised the patient the other lab is still in process and may take a few days. Patient states she would like a call if Dr. Dimple Casey decides to send in the new medication. Advised the patient Dr. Dimple Casey will have to review her results once both are in and decide if she will start the new medication. Patient verbalized understanding.

## 2023-11-22 ENCOUNTER — Ambulatory Visit (HOSPITAL_COMMUNITY): Payer: Medicare HMO

## 2023-11-23 LAB — THIOPURINE METHYLTRANSFERASE (TPMT), RBC: TPMT Activity:: 32.9 U/mL{RBCs}

## 2023-11-26 ENCOUNTER — Encounter: Payer: Self-pay | Admitting: Hematology

## 2023-11-26 ENCOUNTER — Other Ambulatory Visit (HOSPITAL_COMMUNITY): Payer: Self-pay

## 2023-11-26 MED ORDER — AZATHIOPRINE 50 MG PO TABS
25.0000 mg | ORAL_TABLET | Freq: Every day | ORAL | 0 refills | Status: DC
  Filled 2023-11-26: qty 30, 60d supply, fill #0

## 2023-11-26 NOTE — Telephone Encounter (Signed)
 Now addressed in associated result note

## 2023-11-26 NOTE — Telephone Encounter (Signed)
 Patient advised Dr. Dimple Casey doesn't think she needs sooner retesting, since she was checked last week and on treatment with IV iron infusion.    She could check again with the hematology office if still concerned about this.

## 2023-11-26 NOTE — Telephone Encounter (Signed)
 Patient states she would like to know if she should have her labs for her hemoglobin and iron checked sooner than she has been scheduled to do so. Patient states she would like your opinion.

## 2023-11-26 NOTE — Telephone Encounter (Signed)
 I don't think she needs sooner retesting, since she was checked last week and on treatment with IV iron infusion.   She could check again with the hematology office if still concerned about this.

## 2023-11-26 NOTE — Progress Notes (Signed)
 Sed rate of 55 remains elevated indicating active inflammation although it is slightly less that it has been over the past 4 months.  The TPMT enzyme test so she is a high metabolizer should not be at increased risk of side effects from the azathioprine.  I will send a prescription for this.  The starting dose based on her body weight is only 1/2 tablet daily we should follow-up on this dose in 1 to 2 months before deciding if she can increase.

## 2023-11-27 ENCOUNTER — Encounter: Payer: Self-pay | Admitting: Hematology

## 2023-11-28 ENCOUNTER — Ambulatory Visit: Payer: Medicare HMO | Admitting: Surgical

## 2023-11-28 ENCOUNTER — Telehealth: Payer: Self-pay | Admitting: *Deleted

## 2023-11-28 NOTE — Telephone Encounter (Signed)
 Patient contacted the office wanting to follow up to see if you have found a solution for support for her feet and ankles. Please advise.

## 2023-11-30 ENCOUNTER — Ambulatory Visit: Payer: Medicare HMO | Admitting: Surgical

## 2023-12-03 ENCOUNTER — Inpatient Hospital Stay: Attending: Hematology

## 2023-12-03 ENCOUNTER — Other Ambulatory Visit: Payer: Self-pay | Admitting: *Deleted

## 2023-12-03 ENCOUNTER — Telehealth: Payer: Self-pay | Admitting: *Deleted

## 2023-12-03 DIAGNOSIS — D72829 Elevated white blood cell count, unspecified: Secondary | ICD-10-CM | POA: Insufficient documentation

## 2023-12-03 DIAGNOSIS — D500 Iron deficiency anemia secondary to blood loss (chronic): Secondary | ICD-10-CM

## 2023-12-03 DIAGNOSIS — Z79624 Long term (current) use of inhibitors of nucleotide synthesis: Secondary | ICD-10-CM | POA: Insufficient documentation

## 2023-12-03 DIAGNOSIS — D75839 Thrombocytosis, unspecified: Secondary | ICD-10-CM | POA: Diagnosis not present

## 2023-12-03 DIAGNOSIS — M069 Rheumatoid arthritis, unspecified: Secondary | ICD-10-CM | POA: Diagnosis not present

## 2023-12-03 DIAGNOSIS — M810 Age-related osteoporosis without current pathological fracture: Secondary | ICD-10-CM | POA: Diagnosis not present

## 2023-12-03 DIAGNOSIS — Z79631 Long term (current) use of antimetabolite agent: Secondary | ICD-10-CM | POA: Insufficient documentation

## 2023-12-03 DIAGNOSIS — Z7982 Long term (current) use of aspirin: Secondary | ICD-10-CM | POA: Insufficient documentation

## 2023-12-03 DIAGNOSIS — F17210 Nicotine dependence, cigarettes, uncomplicated: Secondary | ICD-10-CM | POA: Insufficient documentation

## 2023-12-03 DIAGNOSIS — Z79899 Other long term (current) drug therapy: Secondary | ICD-10-CM | POA: Diagnosis not present

## 2023-12-03 DIAGNOSIS — D509 Iron deficiency anemia, unspecified: Secondary | ICD-10-CM | POA: Diagnosis present

## 2023-12-03 LAB — IRON AND TIBC
Iron: 30 ug/dL (ref 28–170)
Saturation Ratios: 15 % (ref 10.4–31.8)
TIBC: 200 ug/dL — ABNORMAL LOW (ref 250–450)
UIBC: 170 ug/dL

## 2023-12-03 LAB — CBC WITH DIFFERENTIAL/PLATELET
Abs Immature Granulocytes: 0.04 10*3/uL (ref 0.00–0.07)
Basophils Absolute: 0 10*3/uL (ref 0.0–0.1)
Basophils Relative: 0 %
Eosinophils Absolute: 0 10*3/uL (ref 0.0–0.5)
Eosinophils Relative: 0 %
HCT: 32.4 % — ABNORMAL LOW (ref 36.0–46.0)
Hemoglobin: 10.1 g/dL — ABNORMAL LOW (ref 12.0–15.0)
Immature Granulocytes: 0 %
Lymphocytes Relative: 7 %
Lymphs Abs: 0.7 10*3/uL (ref 0.7–4.0)
MCH: 28.8 pg (ref 26.0–34.0)
MCHC: 31.2 g/dL (ref 30.0–36.0)
MCV: 92.3 fL (ref 80.0–100.0)
Monocytes Absolute: 0.2 10*3/uL (ref 0.1–1.0)
Monocytes Relative: 2 %
Neutro Abs: 9 10*3/uL — ABNORMAL HIGH (ref 1.7–7.7)
Neutrophils Relative %: 91 %
Platelets: 407 10*3/uL — ABNORMAL HIGH (ref 150–400)
RBC: 3.51 MIL/uL — ABNORMAL LOW (ref 3.87–5.11)
RDW: 16.9 % — ABNORMAL HIGH (ref 11.5–15.5)
WBC: 10 10*3/uL (ref 4.0–10.5)
nRBC: 0 % (ref 0.0–0.2)

## 2023-12-03 LAB — SAMPLE TO BLOOD BANK

## 2023-12-03 LAB — FERRITIN: Ferritin: 262 ng/mL (ref 11–307)

## 2023-12-03 NOTE — Telephone Encounter (Signed)
 Patient contacted the office stating she took Imuran for about 4 days. Patient states she is not tolerating the medication. Patient states she had aching all over her body and severe diarrhea. Patient denies any nausea and vomiting.

## 2023-12-03 NOTE — Telephone Encounter (Signed)
 Patient made aware that all of her lab values were normal, therefore no indication for iron infusions or blood products are needed.  Verbalized understanding.

## 2023-12-03 NOTE — Telephone Encounter (Signed)
 Patient called to advise that she is feeling weak and is requesting that iron and cbc be checked.  Per Rojelio Brenner, she will come in today to have CBCD/BB/Iron Panel and Ferritin.  Patient aware.

## 2023-12-06 ENCOUNTER — Ambulatory Visit: Payer: Medicare HMO | Admitting: Sports Medicine

## 2023-12-07 ENCOUNTER — Other Ambulatory Visit: Payer: Self-pay | Admitting: Radiology

## 2023-12-07 DIAGNOSIS — D649 Anemia, unspecified: Secondary | ICD-10-CM

## 2023-12-07 NOTE — H&P (Signed)
 Chief Complaint: Refractory anemia, request for image guided bone marrow biopsy to guide diagnosis  Referring Provider(s): Pennington,Rebekah M   Supervising Physician: Oley Balm  Patient Status: [facility] - Out-pt  History of Present Illness: Vanessa Lara is a 68 y.o. female with a PMH of refractory anemia (received iron infusions)  seropositive rheumatoid arthritis , recent heme positive stool sample (underwent endo without any upper or lower GI findings). Most recent Hgb 10.1 on 3/3, 7.8  on 1/27. Today she endorses generalized body aches, primarily L shoulder, bilateral hands and feet. She states this is baseline for her. She presents today for image guided bone marrow biopsy to further evaluate anemia.  Patient is Full Code  Past Medical History:  Diagnosis Date   Anemia    Anxiety    Arthritis    RA   Bursitis    Cataract    CHF (congestive heart failure) (HCC)    Collagen vascular disease (HCC)    Depression    Endometriosis    Heart murmur    High cholesterol    Hypertension    IBS (irritable bowel syndrome)    Lymphadenopathy    Mitral valve prolapse    Osteoporosis    RA (rheumatoid arthritis) (HCC)    Rotator cuff tear    bilateral   Stroke (HCC) 06/2022   Uterus, adenomyosis     Past Surgical History:  Procedure Laterality Date   AXILLARY LYMPH NODE BIOPSY Right 10/12/2020   Procedure: RIGHT AXILLARY LYMPH NODE BIOPSY EXCISION;  Surgeon: Almond Lint, MD;  Location: [facility];  Service: General;  Laterality: Right;   BUNIONECTOMY Right    CARPAL TUNNEL RELEASE Right    COLONOSCOPY     COLONOSCOPY N/A 12/13/2015   Procedure: COLONOSCOPY;  Surgeon: Malissa Hippo, MD;  [facility];  Service: Endoscopy;  Laterality: N/A;  9:55   Fatty tumor     2011 (left arm)   TONSILLECTOMY     TOTAL ABDOMINAL HYSTERECTOMY     2003    Allergies: Abatacept, Codeine, Golimumab, Hydrocodone, Methylprednisolone, Methotrexate  derivatives, and Prednisone  Medications: Prior to Admission medications   Medication Sig Start Date End Date Taking? Authorizing Provider  acetaminophen (TYLENOL) 500 MG tablet Take 500 mg by mouth every 6 (six) hours as needed.    [provider]  acetaminophen-codeine (TYLENOL #3) 300-30 MG tablet Take 1 tablet by mouth daily as needed for moderate pain (pain score 4-6). Patient not taking: Reported on 11/19/2023 07/26/23   Magnant, Joycie Peek, PA-C  Ascorbic Acid (VITAMIN C PO) Take by mouth.    [provider]  ASPIRIN 81 PO Take 1 tablet by mouth daily.    [provider]  atorvastatin (LIPITOR) 10 MG tablet Take by mouth. 09/17/23   [provider]  azaTHIOprine (IMURAN) 50 MG tablet Take 0.5 tablets (25 mg total) by mouth daily. 11/26/23   Fuller Plan, MD  BD SYRINGE SLIP TIP 25G X 5/8" 1 ML MISC USE AS DIRECTED FOR ONCE MONTHLY B12 INJECTIONS Patient not taking: Reported on 11/19/2023 03/29/22   Magnant, Joycie Peek, PA-C  Cholecalciferol (VITAMIN D3) 25 MCG (1000 UT) CAPS 1 capsule Orally Once a day for 30 day(s)    [provider]  cyanocobalamin (,VITAMIN B-12,) 1000 MCG/ML injection Inject 1,000 mcg into the skin every 30 (thirty) days. 10/27/21   [provider]  cyclobenzaprine (FLEXERIL) 5 MG tablet TAKE 1 TABLET(5 MG) BY MOUTH AT BEDTIME  AS NEEDED FOR MUSCLE SPASMS 05/23/23   Fuller Plan, MD  diclofenac Sodium (VOLTAREN) 1 % GEL SMARTSIG:Gram(s) Topical 4 Times Daily PRN 02/28/23   [provider]  dicyclomine (BENTYL) 10 MG capsule Take 1 capsule (10 mg total) by mouth 4 (four) times daily as needed for spasms. 05/09/21   Hilts, Casimiro Needle, MD  docusate sodium (COLACE) 100 MG capsule TAKE 1 CAPSULE(100 MG) BY MOUTH TWICE DAILY 11/05/23   Magnant, Charles L, PA-C  DULoxetine (CYMBALTA) 20 MG capsule Take 20 mg by mouth 2 (two) times daily. Patient not taking: Reported on 11/19/2023 05/19/23   [provider]   fentaNYL (DURAGESIC) 50 MCG/HR Place onto the skin every 3 (three) days. Patient states she does it every other day.    [provider]  fentaNYL (DURAGESIC) 50 MCG/HR Place onto the skin. Patient not taking: Reported on 11/19/2023    [provider]  folic acid (FOLVITE) 1 MG tablet Take 1 mg by mouth daily.    [provider]  furosemide (LASIX) 20 MG tablet Take 20 mg by mouth daily as needed. Patient not taking: Reported on 11/19/2023 10/07/21   [provider]  Heat Wraps [facility] BACK/HIP) MISC 1 application by Does not apply route daily. 10/27/20   Hilts, Casimiro Needle, MD  hydroxychloroquine (PLAQUENIL) 200 MG tablet Take daily 5 days out of the week 10/14/23   Rice, Jamesetta Orleans, MD  hydrOXYzine (ATARAX) 25 MG tablet Take 25 mg by mouth 2 (two) times daily as needed for itching. Patient not taking: Reported on 11/19/2023 06/02/22   [provider]  hydrOXYzine (ATARAX) 50 MG tablet Take by mouth. Patient not taking: Reported on 11/19/2023 02/02/22   [provider]  Insulin Pen Needle (SURE COMFORT PEN NEEDLES) 30G X 8 MM MISC Use as directed Patient not taking: Reported on 11/19/2023 01/25/23     lidocaine (LIDODERM) 5 % one patch daily. 10/06/21   [provider]  lipase/protease/amylase (CREON) 12000-38000 units CPEP capsule Use 1 capsule with meals and snacks Patient not taking: Reported on 11/19/2023 05/31/21   Roma Kayser, MD  LORazepam (ATIVAN) 0.5 MG tablet TAKE 1/2 TO 1 TABLET BY MOUTH AT BEDTIME AS NEEDED 06/17/21   Magnant, Charles L, PA-C  magnesium oxide (MAG-OX) 400 (240 Mg) MG tablet Take 1 tablet by mouth daily. 06/05/23   [provider]  megestrol (MEGACE) 40 MG tablet Take 1 tablet (40 mg total) by mouth daily. 03/21/21   Hilts, Casimiro Needle, MD  metoprolol tartrate (LOPRESSOR) 25 MG tablet  11/16/21   [provider]  MYRBETRIQ 25 MG TB24 tablet Take 25 mg by mouth daily. 12/29/22   [provider]  MYRBETRIQ 50 MG TB24 tablet Take 50 mg by mouth daily. Patient not taking: Reported on 11/19/2023 09/20/23   [provider]  Na Sulfate-K Sulfate-Mg Sulf 17.5-3.13-1.6 GM/177ML SOLN Take 1 kit by mouth as directed. Patient not taking: Reported on 11/19/2023 06/11/23   Sherrilyn Rist, MD  naloxone [facility]) nasal spray 4 mg/0.1 mL  09/15/21   [provider]  naratriptan (AMERGE) 2.5 MG tablet Take 1 tablet (2.5 mg total) by mouth as needed for migraine. Take one (1) tablet at onset of headache; if returns or does not resolve, may repeat after 4 hours; do not exceed five (5) mg in 24 hours. 08/07/22   Ocie Doyne, MD  ondansetron (ZOFRAN-ODT) 4 MG disintegrating tablet Take 4 mg by mouth every 8 (eight) hours as needed. Patient  not taking: Reported on 11/19/2023 08/22/21   [provider]  pantoprazole (PROTONIX) 40 MG tablet TAKE 1 TABLET(40 MG) BY MOUTH TWICE DAILY 04/11/23   Sherrilyn Rist, MD  PARoxetine (PAXIL) 20 MG tablet Take 20 mg by mouth daily. 12/29/22   [provider]  pregabalin (LYRICA) 25 MG capsule Take 1 capsule (25 mg total) by mouth 2 (two) times daily. Patient not taking: Reported on 11/19/2023 07/04/22   Ocie Doyne, MD  promethazine (PHENERGAN) 25 MG tablet Take 0.5-1 tablets (12.5-25 mg total) by mouth every 6 (six) hours as needed for nausea or vomiting. 04/19/21   Hilts, Casimiro Needle, MD  sucralfate (CARAFATE) 1 GM/10ML suspension SHAKE LIQUID AND TAKE 10 ML(1 GRAM) BY MOUTH FOUR TIMES DAILY 08/24/23   [provider]  Syringe/Needle, Disp, (SYRINGE 3CC/23GX1") 23G X 1" 3 ML MISC by Does not apply route. Patient not taking: Reported on 11/19/2023 11/15/21   [provider]  traZODone (DESYREL) 100 MG tablet Take 1 tablet (100 mg total) by mouth at bedtime as needed for sleep. 01/25/21   Hilts, Casimiro Needle, MD  Zoledronic Acid (RECLAST IV) Inject 5 mg into the vein. Once yearly    Rice, Jamesetta Orleans, MD     Family  History  Problem Relation Age of Onset   Bone cancer Mother    Brain cancer Father    Brain cancer Sister    Breast cancer Sister    Renal Disease Sister    Pancreatic cancer Neg Hx    Esophageal cancer Neg Hx    Stomach cancer Neg Hx    Rectal cancer Neg Hx    Colon polyps Neg Hx     Social History   Socioeconomic History   Marital status: Single    Spouse name: Not on file   Number of children: Not on file   Years of education: Not on file   Highest education level: Not on file  Occupational History   Occupation: retired  Tobacco Use   Smoking status: Some Days    Current packs/day: 0.00    Average packs/day: 0.5 packs/day for 33.0 years (16.5 ttl pk-yrs)    Types: Cigarettes    Start date: 25    Last attempt to quit: 2013    Years since quitting: 12.1   Smokeless tobacco: Never  Vaping Use   Vaping status: Never Used  Substance and Sexual Activity   Alcohol use: No    Alcohol/week: 0.0 standard drinks of alcohol   Drug use: No   Sexual activity: Not Currently  Other Topics Concern   Not on file  Social History Narrative   Not on file   Social Drivers of Health   Financial Resource Strain: Low Risk  (11/14/2022)   Received from [facility]   Overall Financial Resource Strain (CARDIA)    Difficulty of Paying Living Expenses: Not hard at all  Food Insecurity: No Food Insecurity (11/14/2022)   Received from [facility], [facility]   Hunger Vital Sign    Worried About Running Out of Food in the Last Year: Never true    Ran Out of Food in the Last Year: Never true  Transportation Needs: No Transportation Needs (11/14/2022)   Received from [facility], [facility]   PRAPARE - Transportation    Lack of Transportation (Medical): No    Lack of Transportation (Non-Medical): No  Physical Activity: Unknown (11/14/2022)   Received from [facility], [facility]   Exercise Vital Sign  Days of Exercise per Week: 0 days    Minutes of  Exercise per Session: Not on file  Stress: Stress Concern Present (11/14/2022)   Received from [facility], [facility] - Occupational Stress Questionnaire    Feeling of Stress : Very much  Social Connections: Somewhat Isolated (11/14/2022)   Received from [facility], [facility]   Social Network    How would you rate your social network (family, work, friends)?: Restricted participation with some degree of social isolation      Review of Systems  Constitutional:  Negative for chills and fever.       Generalized body aches  Respiratory:  Negative for shortness of breath.   Cardiovascular:  Negative for chest pain.  Gastrointestinal:  Negative for abdominal pain, blood in stool, nausea and vomiting.  Musculoskeletal:  Positive for arthralgias and myalgias.  Skin:  Negative for rash.  Hematological:  Does not bruise/bleed easily.     Vital Signs: Vitals:   12/10/23 0905  BP: (!) 89/50  Pulse: 100  Resp: 16  Temp: 97.6 F (36.4 C)  SpO2: 94%      Advance Care Plan: No documents on file   Physical Exam Constitutional:      Comments: Cachectic   Cardiovascular:     Rate and Rhythm: Normal rate and regular rhythm.     Pulses: Normal pulses.     Heart sounds: Normal heart sounds.  Pulmonary:     Effort: Pulmonary effort is normal.     Breath sounds: Normal breath sounds.  Abdominal:     General: There is no distension.     Palpations: Abdomen is soft.     Tenderness: There is no abdominal tenderness.  Skin:    General: Skin is warm and dry.  Neurological:     Mental Status: She is alert and oriented to person, place, and time.  Psychiatric:        Mood and Affect: Mood normal.      Imaging: MR SHOULDER LEFT WO CONTRAST Result Date: 11/19/2023 CLINICAL DATA:  Status post fall, left shoulder pain since December EXAM: MRI OF THE LEFT SHOULDER WITHOUT CONTRAST TECHNIQUE: Multiplanar, multisequence MR imaging of  the shoulder was performed. No intravenous contrast was administered. COMPARISON:  01/10/2021 FINDINGS: Rotator cuff: Moderate supraspinatus and infraspinatus tendinosis without a tear. Teres minor tendon is intact. Subscapularis tendon is intact. Muscles: No muscle atrophy or edema. No intramuscular fluid collection or hematoma. Biceps Long Head: Proximal long of the biceps tendon not well visualized concerning for a complete tear. Acromioclavicular Joint: No significant arthropathy of the acromioclavicular joint. Trace subacromial/subdeltoid bursal fluid. Glenohumeral Joint: No joint effusion. Mild partial-thickness cartilage loss of the glenohumeral joint. Labrum: Grossly intact, but evaluation is limited by lack of intraarticular fluid/contrast. Bones: No fracture or dislocation. No aggressive osseous lesion. Other: No fluid collection or hematoma. IMPRESSION: 1. Moderate supraspinatus and infraspinatus tendinosis without a tear. 2. Proximal long of the biceps tendon not well visualized concerning for a complete tear. Electronically Signed   By: Elige Ko M.D.   On: 11/19/2023 12:45    Labs:  CBC: Recent Labs    11/05/23 1114 11/14/23 1225 11/19/23 1313 12/03/23 1321  WBC 12.1* 11.6* 15.9* 10.0  HGB 10.3* 10.2* 10.0* 10.1*  HCT 33.8* 32.4* 32.2* 32.4*  PLT 372 595* 548* 407*    COAGS: No results for input(s): "INR", "APTT" in the last 8760 hours.  BMP: Recent Labs  05/21/23 0930 06/14/23 1331 10/05/23 1327 11/19/23 1313  NA 135 139 135 135  K 3.5 3.5 3.5 3.3*  CL 101 102 103 100  CO2 26 26 25 25   GLUCOSE 94 121* 99 127*  BUN 14 10 9 9   CALCIUM 8.8* 9.1 8.3* 8.9  CREATININE 0.62 0.70 0.46 0.49  GFRNONAA >60 >60 >60 >60    LIVER FUNCTION TESTS: Recent Labs    05/21/23 0930 06/14/23 1331 08/03/23 1347 10/05/23 1327 11/19/23 1313  BILITOT 0.5 0.3 0.4 0.2 0.3  AST 18 28  --  17 20  ALT 10 13  --  11 11  ALKPHOS 110 142*  --  75 106  PROT 7.3 7.5  --  6.6 7.3   ALBUMIN 3.8 3.6  --  3.3* 3.6    TUMOR MARKERS: No results for input(s): "AFPTM", "CEA", "CA199", "CHROMGRNA" in the last 8760 hours.  Assessment and Plan: 68 y.o. female with a PMH of refractory anemia (received iron infusions)  seropositive rheumatoid arthritis , recent heme positive stool sample (underwent endo without any upper or lower GI findings). Most recent Hgb 10.1 on 3/3, 7.8  on 1/27.  Requested image guided bone marrow biopsy to address persistent anemia despite iron infusions. -repeat CBC 3/10, pending at time of exam -NPO MN prior to procedure -hold ASA x5 days -friend is at bedside in [facility], confirms she will be with patient for 24hr postprocedure.  -BP 89/50 and already present fentanyl patch identified by RN in short stay. Sedating RN aware. Measurement comparable to patient baseline BP readings and not unexpected given cachectic appearance. Will monitor closely intra procedure. Pt aware this may limit administration of sedating medications.   Risks and benefits of bone marrow biopsy was discussed with the patient and/or patient's family including, but not limited to bleeding, infection, damage to adjacent structures or low yield requiring additional tests.  All of the questions were answered and there is agreement to proceed.  Consent signed and in chart.  Thank you for allowing our service to participate in MIRJANA TARLETON 's care.    Electronically Signed: Carlton Adam, NP   12/07/2023, 4:15 PM     I spent a total of 20 minutes   in face to face in clinical consultation, greater than 50% of which was counseling/coordinating care for image guided bone marrow biopsy for diagnostic testing to follow refractory anemia   (A copy of this note was sent to the referring provider and the time of visit.)

## 2023-12-10 ENCOUNTER — Other Ambulatory Visit: Payer: Self-pay

## 2023-12-10 ENCOUNTER — Encounter (HOSPITAL_COMMUNITY): Payer: Self-pay

## 2023-12-10 ENCOUNTER — Ambulatory Visit (HOSPITAL_COMMUNITY)
Admission: RE | Admit: 2023-12-10 | Discharge: 2023-12-10 | Disposition: A | Discharge status: Home / Self Care | Payer: Medicare HMO | Source: Ambulatory Visit | Attending: Physician Assistant

## 2023-12-10 DIAGNOSIS — D500 Iron deficiency anemia secondary to blood loss (chronic): Secondary | ICD-10-CM

## 2023-12-10 DIAGNOSIS — D75839 Thrombocytosis, unspecified: Secondary | ICD-10-CM | POA: Diagnosis not present

## 2023-12-10 DIAGNOSIS — M059 Rheumatoid arthritis with rheumatoid factor, unspecified: Secondary | ICD-10-CM | POA: Diagnosis not present

## 2023-12-10 DIAGNOSIS — D649 Anemia, unspecified: Secondary | ICD-10-CM | POA: Diagnosis not present

## 2023-12-10 DIAGNOSIS — D464 Refractory anemia, unspecified: Secondary | ICD-10-CM | POA: Diagnosis present

## 2023-12-10 LAB — CBC WITH DIFFERENTIAL/PLATELET
Abs Immature Granulocytes: 0.08 10*3/uL — ABNORMAL HIGH (ref 0.00–0.07)
Basophils Absolute: 0.1 10*3/uL (ref 0.0–0.1)
Basophils Relative: 0 %
Eosinophils Absolute: 0.1 10*3/uL (ref 0.0–0.5)
Eosinophils Relative: 0 %
HCT: 36.5 % (ref 36.0–46.0)
Hemoglobin: 11.2 g/dL — ABNORMAL LOW (ref 12.0–15.0)
Immature Granulocytes: 1 %
Lymphocytes Relative: 5 %
Lymphs Abs: 0.9 10*3/uL (ref 0.7–4.0)
MCH: 28.4 pg (ref 26.0–34.0)
MCHC: 30.7 g/dL (ref 30.0–36.0)
MCV: 92.6 fL (ref 80.0–100.0)
Monocytes Absolute: 0.7 10*3/uL (ref 0.1–1.0)
Monocytes Relative: 4 %
Neutro Abs: 14 10*3/uL — ABNORMAL HIGH (ref 1.7–7.7)
Neutrophils Relative %: 90 %
Platelets: 519 10*3/uL — ABNORMAL HIGH (ref 150–400)
RBC: 3.94 MIL/uL (ref 3.87–5.11)
RDW: 16.6 % — ABNORMAL HIGH (ref 11.5–15.5)
WBC: 15.7 10*3/uL — ABNORMAL HIGH (ref 4.0–10.5)
nRBC: 0 % (ref 0.0–0.2)

## 2023-12-10 MED ORDER — MIDAZOLAM HCL 2 MG/2ML IJ SOLN
INTRAMUSCULAR | Status: AC | PRN
  Administered 2023-12-10 (×2): .5 mg via INTRAVENOUS

## 2023-12-10 MED ORDER — MIDAZOLAM HCL 2 MG/2ML IJ SOLN
INTRAMUSCULAR | Status: AC
  Filled 2023-12-10: qty 2

## 2023-12-10 MED ORDER — FENTANYL CITRATE (PF) 100 MCG/2ML IJ SOLN
INTRAMUSCULAR | Status: AC | PRN
  Administered 2023-12-10: 25 ug via INTRAVENOUS

## 2023-12-10 MED ORDER — SODIUM CHLORIDE 0.9 % IV SOLN: INTRAVENOUS | Status: DC

## 2023-12-10 MED ORDER — FENTANYL CITRATE (PF) 100 MCG/2ML IJ SOLN
INTRAMUSCULAR | Status: AC
  Filled 2023-12-10: qty 2

## 2023-12-10 NOTE — Discharge Instructions (Signed)

## 2023-12-10 NOTE — Procedures (Signed)
  Procedure:  CT bone marrow biopsy R iliac Preprocedure diagnosis: The encounter diagnosis was Severe anemia. Postprocedure diagnosis: same EBL:    minimal Complications:   none immediate  See full dictation in [employer].  Thora Lance MD Main # [phone] Pager  [phone] Mobile [phone]

## 2023-12-11 ENCOUNTER — Inpatient Hospital Stay: Payer: Medicare HMO

## 2023-12-12 ENCOUNTER — Inpatient Hospital Stay: Payer: Medicare HMO

## 2023-12-12 ENCOUNTER — Inpatient Hospital Stay: Payer: Medicare HMO | Admitting: Physician Assistant

## 2023-12-12 LAB — SURGICAL PATHOLOGY

## 2023-12-14 ENCOUNTER — Encounter: Payer: Self-pay | Admitting: Surgical

## 2023-12-14 ENCOUNTER — Ambulatory Visit: Payer: Medicare HMO | Admitting: Surgical

## 2023-12-14 ENCOUNTER — Telehealth: Payer: Self-pay | Admitting: *Deleted

## 2023-12-14 ENCOUNTER — Encounter: Payer: Self-pay | Admitting: Hematology

## 2023-12-14 ENCOUNTER — Other Ambulatory Visit: Payer: Self-pay

## 2023-12-14 VITALS — Wt <= 1120 oz

## 2023-12-14 DIAGNOSIS — M7521 Bicipital tendinitis, right shoulder: Secondary | ICD-10-CM

## 2023-12-14 DIAGNOSIS — M25512 Pain in left shoulder: Secondary | ICD-10-CM | POA: Diagnosis not present

## 2023-12-14 DIAGNOSIS — M25511 Pain in right shoulder: Secondary | ICD-10-CM | POA: Diagnosis not present

## 2023-12-14 DIAGNOSIS — M7522 Bicipital tendinitis, left shoulder: Secondary | ICD-10-CM | POA: Diagnosis not present

## 2023-12-14 MED ORDER — BUPIVACAINE HCL 0.5 % IJ SOLN
4.5000 mL | INTRAMUSCULAR | Status: AC | PRN
  Administered 2023-12-14: 4.5 mL via INTRA_ARTICULAR

## 2023-12-14 MED ORDER — METHYLPREDNISOLONE ACETATE 40 MG/ML IJ SUSP
20.0000 mg | INTRAMUSCULAR | Status: AC | PRN
  Administered 2023-12-14: 20 mg via INTRA_ARTICULAR

## 2023-12-14 NOTE — Telephone Encounter (Signed)
 Patient contacted the office stating "the arthritis is taking over my body". Patient states she is having trouble in all her fingers and toes. Patient states she is having trouble walking. Patient states she can not even take 2 steps without being in pain. Patient was not able to tolerate them Imuran. Patient would like to know if you can figure out something she can take to help with her RA. Patient states she will be out of the house some this afternoon as she has an appointment with Ortho at 1:15 pm.

## 2023-12-14 NOTE — Progress Notes (Signed)
 Office Visit Note   Patient: Vanessa Lara           Date of Birth: 1956-04-03           MRN: [id number] Visit Date: 12/14/2023 Requested by: [name], NP [address] [place],  [place] [address] PCP: [name], NP  Subjective: Chief Complaint  Patient presents with   Other    Patient states she hurts all over     HPI: Vanessa Lara is a 68 y.o. female who presents to the office reporting bilateral shoulder pain.  She describes primarily anterior pain around the bicipital groove in both shoulders.  No recent injury.  She has had prior injections into the left glenohumeral joint with good relief.  She also has history of scapulothoracic bursitis with prior injection by Dr. Shon Baton with good relief..                ROS: All systems reviewed are negative as they relate to the chief complaint within the history of present illness.  Patient denies fevers or chills.  Assessment & Plan: Visit Diagnoses:  1. Biceps tendinitis of both shoulders   2. Bilateral shoulder pain, unspecified chronicity     Plan: Patient is a 68 year old female who presents for evaluation of bilateral shoulder pain.  She has findings concerning for bilateral shoulder bicep tendinitis with tenderness over the bicipital groove and pain reproduced with bicep flexion on exam today.  Plan is cortisone injection into bilateral glenohumeral joints.  Tolerated procedure well without complication.  We will also set her up for scapulothoracic bursa injection with Dr. Shon Baton.  Follow-up with the office as needed.  Did have good symptomatic relief of her shoulder pain prior to leaving the office during the anesthetic portion of the injection  Follow-Up Instructions: Return if symptoms worsen or fail to improve.   Orders:  Orders Placed This Encounter  Procedures   US Guided Needle Placement - No Linked Charges   No orders of the defined types were placed in this encounter.      Procedures: Large Joint Inj: bilateral glenohumeral on 12/14/2023 4:32 PM Details: 25 G 1.5 in needle, ultrasound-guided posterior approach Medications (Right): 4.5 mL bupivacaine 0.5 %; 20 mg methylPREDNISolone acetate 40 MG/ML Medications (Left): 4.5 mL bupivacaine 0.5 %; 20 mg methylPREDNISolone acetate 40 MG/ML Outcome: tolerated well, no immediate complications Procedure, treatment alternatives, risks and benefits explained, specific risks discussed. Consent was given by the patient. Patient was prepped and draped in the usual sterile fashion.       Clinical Data: No additional findings.  Objective: Vital Signs: Wt 64 lb 9.6 oz (29.3 kg)   BMI 12.62 kg/m   Physical Exam:  Constitutional: Patient appears well-developed HEENT:  Head: Normocephalic Eyes:EOM are normal Neck: Normal range of motion Cardiovascular: Normal rate Pulmonary/chest: Effort normal Neurologic: Patient is alert Skin: Skin is warm Psychiatric: Patient has normal mood and affect  Ortho Exam: Ortho exam demonstrates bilateral shoulder with well-preserved passive and active range of motion.  Intact rotator cuff strength of supra, infra, subscap rated 5/5.  Axillary nerve intact with deltoid firing.  Tenderness over the bicipital groove bilaterally.  No tenderness of the AC joint.  No cellulitis or skin changes noted around the shoulder region.  2+ radial pulse of the bilateral extremity.  Specialty Comments:  MRI CERVICAL SPINE WITHOUT CONTRAST   TECHNIQUE: Multiplanar, multisequence MR imaging of the cervical spine was performed. No intravenous contrast was  administered.   COMPARISON:  None.   FINDINGS: Alignment: 2 mm retrolisthesis of C5 on C6.   Vertebrae: No acute fracture, evidence of discitis, or bone lesion.   Cord: Normal signal and morphology.   Posterior Fossa, vertebral arteries, paraspinal tissues: Posterior fossa demonstrates no focal abnormality. Vertebral artery flow voids are  maintained. Paraspinal soft tissues are unremarkable.   Disc levels:   Discs: Degenerative disease with disc height loss at C4-5 and C5-6.   C2-3: No significant disc bulge. No neural foraminal stenosis. No central canal stenosis.   C3-4: Mild broad-based disc bulge. No foraminal or central canal stenosis.   C4-5: Mild broad-based disc bulge. No foraminal or central canal stenosis.   C5-6: Mild broad-based disc bulge. Bilateral uncovertebral degenerative changes. Moderate left and severe right foraminal stenosis. No spinal stenosis.   C6-7: Mild broad-based disc bulge. No foraminal or central canal stenosis.   C7-T1: No significant disc bulge. No neural foraminal stenosis. No central canal stenosis.   IMPRESSION: 1. At C5-6 there is a mild broad-based disc bulge. Bilateral uncovertebral degenerative changes. Moderate left and severe right foraminal stenosis. 2. Otherwise, mild cervical spine spondylosis as described above.     Electronically Signed   By: Elige Ko M.D.   On: 12/16/2021 10:51  Imaging: US Guided Needle Placement - No Linked Charges Result Date: 12/14/2023 Ultrasound imaging demonstrates needle placement into the glenohumeral joints with injection of fluid and no complication.     PMFS History: Patient Active Problem List   Diagnosis Date Noted   High risk medication use 01/16/2023   Medication monitoring encounter 12/05/2021   Physical deconditioning 11/11/2021   Elevated serum lactate dehydrogenase (LDH) 08/17/2021   Vitamin D deficiency 08/17/2021   Chronic fatigue 07/25/2021   Vitamin B12 deficiency 07/19/2021   Hardening of the aorta (main artery of the heart) (HCC) 06/21/2021   Solitary lung nodule 06/15/2021   Loss of weight 05/18/2021   Protein-calorie malnutrition, severe (HCC) 05/18/2021   Osteoporosis 06/15/2020   Iron deficiency anemia 04/09/2020   Primary osteoarthritis involving multiple joints 12/31/2018   Rheumatoid  arthritis involving multiple sites with positive rheumatoid factor (HCC) 12/31/2018   Irritable bowel syndrome with both constipation and diarrhea 10/28/2018   Constipation 03/14/2016   Mitral valve prolapse 11/23/2015   High cholesterol 11/23/2015   Past Medical History:  Diagnosis Date   Anemia    Anxiety    Arthritis    RA   Bursitis    Cataract    CHF (congestive heart failure) (HCC)    Collagen vascular disease (HCC)    Depression    Endometriosis    Heart murmur    High cholesterol    Hypertension    IBS (irritable bowel syndrome)    Lymphadenopathy    Mitral valve prolapse    Osteoporosis    RA (rheumatoid arthritis) (HCC)    Rotator cuff tear    bilateral   Stroke (HCC) 06/2022   Uterus, adenomyosis     Family History  Problem Relation Age of Onset   Bone cancer Mother    Brain cancer Father    Brain cancer Sister    Breast cancer Sister    Renal Disease Sister    Pancreatic cancer Neg Hx    Esophageal cancer Neg Hx    Stomach cancer Neg Hx    Rectal cancer Neg Hx    Colon polyps Neg Hx     Past Surgical History:  Procedure Laterality Date  AXILLARY LYMPH NODE BIOPSY Right 10/12/2020   Procedure: RIGHT AXILLARY LYMPH NODE BIOPSY EXCISION;  Surgeon: Almond Lint, MD;  Location: [facility];  Service: General;  Laterality: Right;   BUNIONECTOMY Right    CARPAL TUNNEL RELEASE Right    COLONOSCOPY     COLONOSCOPY N/A 12/13/2015   Procedure: COLONOSCOPY;  Surgeon: Malissa Hippo, MD;  Location: [facility];  Service: Endoscopy;  Laterality: N/A;  9:55   Fatty tumor     2011 (left arm)   TONSILLECTOMY     TOTAL ABDOMINAL HYSTERECTOMY     2003   Social History   Occupational History   Occupation: retired  Tobacco Use   Smoking status: Some Days    Current packs/day: 0.00    Average packs/day: 0.5 packs/day for 33.0 years (16.5 ttl pk-yrs)    Types: Cigarettes    Start date: 31    Last attempt to quit: 2013    Years since  quitting: 12.2   Smokeless tobacco: Never  Vaping Use   Vaping status: Never Used  Substance and Sexual Activity   Alcohol use: No    Alcohol/week: 0.0 standard drinks of alcohol   Drug use: No   Sexual activity: Not Currently

## 2023-12-19 ENCOUNTER — Encounter (HOSPITAL_COMMUNITY): Payer: Self-pay | Admitting: Physician Assistant

## 2023-12-21 ENCOUNTER — Other Ambulatory Visit: Payer: Self-pay

## 2023-12-21 DIAGNOSIS — D500 Iron deficiency anemia secondary to blood loss (chronic): Secondary | ICD-10-CM

## 2023-12-23 NOTE — Progress Notes (Unsigned)
 [facility] [address], [place] [address]   CLINIC:  Medical Oncology/Hematology  PCP:  [name], NP [address] [place] [address] [phone]  REASON FOR VISIT:  Follow-up for normocytic anemia   CURRENT THERAPY: Intermittent IV iron  INTERVAL HISTORY:   [name] 68 y.o. female returns for routine follow-up of normocytic anemia.  She was last seen by Rojelio Brenner PA-C on 10/09/2023.  She had bone marrow biopsy done on 12/10/2023, and returns today to discuss results of that test.  At today's visit, she reports feeling poorly.  She continues to suffer from chronic pain, malnutrition, weight loss, severe fatigue, and general malaise.  She is seeing multiple specialists for workup and management of these chronic issues.  Since last visit, she received IV Venofer 300 mg on 10/09/2023 and 200 mg on 11/19/2023.  She denies having felt any improvement in energy after her most recent iron, although she had previously found it helpful. She also reports severe cold intolerance, dyspnea on exertion, and palpitations. Occasional dizziness and headaches.  No chest pain.  She reports generalized weakness and deconditioning, with increased heart rate and tachypnea reported with exertion.  She continues to deny any obvious rectal bleeding, melena, epistaxis, or other source of bleeding.  She most recently received steroid injection via orthopedist on 12/14/2023.  She is currently taking daily prednisone 5 mg.  She takes Plaquenil 5 days weekly.   No B symptoms, masses, or lymphadenopathy.  She has little to no energy and little to no appetite.  She continues to be severely underweight and cachectic.  ASSESSMENT & PLAN:  1.  Normocytic anemia - (anemia of chronic disease +/- iron deficiency) - History of significant iron deficiency with mild anemia (Hgb ranging from 11.0-12.0) since at least 2021. - Moderate to severe anemia (Hgb <10.0) since  October 2024 (NOTE: Started on Plaquenil in June 2024 for rheumatoid arthritis) - PRBC transfusion x 1 on 10/30/2023 due to Hgb 7.8, symptomatic - She was Hemoccult stool POSITIVE x 3. - Most recent EGD (06/18/2023): Normal esophagus.  Diffuse mildly congestive gastric mucosa.  Normal mucosa in duodenum.  Biopsies taken for evaluation of celiac disease were NEGATIVE. - Most recent colonoscopy (06/18/2023): Decreased sphincter tone.  Otherwise normal colon. - Capsule endoscopy (02/08/2021): Complete capsule endoscopy with no findings to explain patient's symptoms - CT enterography abdomen/pelvis (01/04/2021): No abnormal findings to explain chronic iron deficiency   - Anemia workup summarized below:  Negative for hemolysis (normal haptoglobin, bilirubin, LDH, DAT) Nutritional panel: Iron deficiency/functional iron deficiency, but otherwise normal B12, MMA, folate, copper. Inflammatory markers elevated. Kidney function is normal to date SPEP, immunofixation, light chains negative for monoclonal gammopathy, but demonstrate polyclonal gammopathy that is consistent with her chronic autoimmune disease.  - Bone marrow biopsy (12/10/2023): Normocellular bone marrow with trilineage hematopoiesis.  Occasional megakaryocytes with abnormal lobation are noted, but do not reach threshold of 10% for dysplasia.  No increased blasts.  Secondary causes of patient's anemia should be considered.  Cytogenetics with normal female karyotype.  - Most recent IV iron with Venofer 300 mg on 10/09/2023 and 200 mg on 11/19/2023. - No bright red blood per rectum or melena.   - She has severe chronic fatigue, which is multifactorial - Most recent labs (12/24/2023): Hgb 10.5/MCV 88.8 Ferritin 133, iron saturation 11%, marginally low TIBC 214 and low serum iron 23 Creatinine 0.60 with normal GFR. - DIFFERENTIAL DIAGNOSIS favors anemia of chronic  disease with functional iron deficiency, occult GI blood loss, and likely malabsorption  secondary to chronic inflammation.  She may also have some drug-induced myelosuppression related to Plaquenil (note that Plaquenil effects on bone marrow are not always directly observed on bone marrow biopsy). - PLAN: Recommend Venofer 200 mg every 2 weeks x 3 doses for functional iron deficiency.  Patient will receive [facility].   - Continue CBC/BB sample every 2 weeks  - Discussion with other specialists: Gastroenterology: Patient is in the process of establishing care with new gastroenterologist (Dr. Katherine Roan in [place]).  Once established, we will discuss with Dr. Katherine Roan regarding possible ongoing blood loss if she has any further severe drops in hemoglobin. - RTC 3 months for labs and OFFICE visit   2.  Leukocytosis & thrombocytosis - Past CBCs reveal intermittent leukocytosis (neutrophil predominant) and thrombocytosis since at least 2020 - MPN work-up was negative for JAK2 V617F, JAK2 exon 12, MPL and CALR.  Reflex testing to NGS did not reveal any abnormal mutations.  (2021) - BCR/ABL FISH was negative (2021) - Inflammatory markers have been elevated in the setting of rheumatoid arthritis  - Bone marrow biopsy (12/10/2023): Normocellular bone marrow with trilineage hematopoiesis.  Occasional megakaryocytes with abnormal lobation are noted, but do not reach threshold of 10% for dysplasia.  No increased blasts.  Secondary causes of patient's anemia should be considered.  Cytogenetics with normal female karyotype. - She receives frequent steroid injections for degenerative joint disease, most recently on 12/14/2023.  She is also taking prednisone 5 mg daily since December 2024. - Findings are consistent with chronic inflammation, chronic steroid use, and reactive leukocytosis/thrombocytosis - Most recent CBC (12/24/2023) with WBC 10.0, platelets 458 (NOTE: Received methylprednisolone injection on 12/14/2023) - PLAN: Most likely represents reactive  leukocytosis/thrombocytosis.  We will continue active surveillance.   3.  Adult failure to thrive - Patient has had intermittent episodes of weight loss, malnutrition, and decreased functional status ongoing for the past several years and likely related to her severe RA and GI issues - She follows with her PCP, GI, rheumatology, and endocrinology for severe IBS and weight loss. - She is up-to-date on routine cancer screening such as colonoscopy and mammogram. - She has had extensive previous imaging including PET scan in October 2021, all of which has been negative for malignancy - Due to strong family history of malignancy, she was previously recommended for [facility] testing, but this was not covered by insurance and was cost prohibitive. - PLAN: Weight loss and malnutrition is being managed by her PCP.   4.  PET positive right axillary lymph node - PET scan obtained 07/09/2020 due to unintentional weight loss showed mildly hypermetabolic right axillary lymph node - Right axillary lymph node was palpable on exam (November 2021) - She has completed 5 days of azithromycin as she had a history of cat bite. - She was advised by Dr. Leonides Schanz that her lymph node was likely reactive and related to her underlying inflammatory disease, but patient insisted on biopsy. - Patient opted for right axillary lymph node biopsy (10/12/2020), which showed benign reactive lymph node, no malignancy   5.  PET positive ileocecal lesion - PET scan obtained 07/09/2020 for unintentional weight loss showed accentuated activity along the distalmost terminal ileum and ileocecal valve, possibly physiologic since there was no mass or specific abnormality in this region on diagnostic CT as of 06/23/2020. - Patient had colonoscopy on 08/12/2020, in which the cecum and IC valvle  appeared normal. Terminal ileum could not be intubated due to restricted sigmoid and redundant colon. - CT abdomen/pelvis with contrast (01/04/2021): No abnormal  bowel wall thickening or abnormal mural enhancement in the stomach, small bowel, or colon.  No findings to suggest inflammatory or chronic fibrous small bowel stricture.  No mesenteric or intraperitoneal free fluid.  Terminal ileum was normal, no edema or inflammation in the region of the cecum.   6.  Osteoporosis: - DEXA scan on 04/13/2020 shows T score -4.2. - She follows with endocrinology and orthopedics   7.  Rheumatoid arthritis - She follows with Dr. Dimple Casey. - Previously followed with Dr. Dierdre Forth / Azucena Fallen of [facility]  8.  Other issues - Patient expressed some emotional distress and difficulty coping with chronic illness and chronic pain. - She is established with palliative care services. - We discussed that she may benefit from adding a licensed counselor/therapist to her care team, but she feels overwhelmed with the idea of "having another place to go to."  She was very excited to learn that online therapy may be an option. - PLAN: Patient provided with resources for coping with chronic illness, including information regarding various online counseling/therapy services.   PLAN SUMMARY: >> IV Venofer 200 mg every other week x3 doses (at outpatient [facility])  >> CBC/BB sample at [facility] every 2 weeks >> Labs in 3 months = CBC/D, CMP, ferritin, iron/TIBC, BB sample >> OFFICE visit in 3 months (1 week after labs)     REVIEW OF SYSTEMS:  Review of Systems  Constitutional:  Positive for appetite change and fatigue. Negative for chills, diaphoresis, fever and unexpected weight change.  HENT:   Negative for lump/mass and nosebleeds.   Eyes:  Negative for eye problems.  Respiratory:  Positive for shortness of breath. Negative for cough and hemoptysis.   Cardiovascular:  Positive for palpitations. Negative for chest pain and leg swelling.  Gastrointestinal:  Positive for constipation, diarrhea and nausea. Negative for abdominal pain, blood in stool and vomiting.   Genitourinary:  Negative for hematuria.   Musculoskeletal:  Positive for arthralgias.  Skin: Negative.   Neurological:  Positive for dizziness, headaches and numbness. Negative for light-headedness.  Hematological:  Does not bruise/bleed easily.    PHYSICAL EXAM:  ECOG PERFORMANCE STATUS: 2 - Symptomatic, <50% confined to bed  Vitals:   12/24/23 1024  BP: (!) 86/53  Pulse: (!) 107  Resp: 18  Temp: 98.2 F (36.8 C)  SpO2: 100%    Filed Weights   12/24/23 1024  Weight: 65 lb 4.1 oz (29.6 kg)    Physical Exam Constitutional:      Appearance: She is cachectic.     Comments: Weak and frail appearing on exam  HENT:     Head: Normocephalic and atraumatic.  Cardiovascular:     Rate and Rhythm: Regular rhythm. Tachycardia present.     Pulses: Normal pulses.     Heart sounds: Normal heart sounds.  Pulmonary:     Effort: Pulmonary effort is normal.     Breath sounds: Normal breath sounds.  Skin:    General: Skin is warm and dry.  Neurological:     General: No focal deficit present.     Mental Status: She is alert and oriented to person, place, and time.  Psychiatric:        Mood and Affect: Mood is anxious. Affect is tearful.        Behavior: Behavior is agitated.     PAST  MEDICAL/SURGICAL HISTORY:  Past Medical History:  Diagnosis Date   Anemia    Anxiety    Arthritis    RA   Bursitis    Cataract    CHF (congestive heart failure) (HCC)    Collagen vascular disease (HCC)    Depression    Endometriosis    Heart murmur    High cholesterol    Hypertension    IBS (irritable bowel syndrome)    Lymphadenopathy    Mitral valve prolapse    Osteoporosis    RA (rheumatoid arthritis) (HCC)    Rotator cuff tear    bilateral   Stroke (HCC) 06/2022   Uterus, adenomyosis    Past Surgical History:  Procedure Laterality Date   AXILLARY LYMPH NODE BIOPSY Right 10/12/2020   Procedure: RIGHT AXILLARY LYMPH NODE BIOPSY EXCISION;  Surgeon: Almond Lint, MD;  Location:  [facility];  Service: General;  Laterality: Right;   BUNIONECTOMY Right    CARPAL TUNNEL RELEASE Right    COLONOSCOPY     COLONOSCOPY N/A 12/13/2015   Procedure: COLONOSCOPY;  Surgeon: Malissa Hippo, MD;  [facility]: [facility];  Service: Endoscopy;  Laterality: N/A;  9:55   Fatty tumor     2011 (left arm)   TONSILLECTOMY     TOTAL ABDOMINAL HYSTERECTOMY     2003    SOCIAL HISTORY:  Social History   Socioeconomic History   Marital status: Single    Spouse name: Not on file   Number of children: Not on file   Years of education: Not on file   Highest education level: Not on file  Occupational History   Occupation: retired  Tobacco Use   Smoking status: Some Days    Current packs/day: 0.00    Average packs/day: 0.5 packs/day for 33.0 years (16.5 ttl pk-yrs)    Types: Cigarettes    Start date: 75    Last attempt to quit: 2013    Years since quitting: 12.2   Smokeless tobacco: Never  Vaping Use   Vaping status: Never Used  Substance and Sexual Activity   Alcohol use: No    Alcohol/week: 0.0 standard drinks of alcohol   Drug use: No   Sexual activity: Not Currently  Other Topics Concern   Not on file  Social History Narrative   Not on file   Social Drivers of Health   Financial Resource Strain: Low Risk  (11/14/2022)   Received from [facility], [facility]   Overall Financial Resource Strain (CARDIA)    Difficulty of Paying Living Expenses: Not hard at all  Food Insecurity: No Food Insecurity (11/14/2022)   Received from [facility], [facility]   Hunger Vital Sign    Worried About Running Out of Food in the Last Year: Never true    Ran Out of Food in the Last Year: Never true  Transportation Needs: No Transportation Needs (11/14/2022)   Received from [facility], [facility]   PRAPARE - Transportation    Lack of Transportation (Medical): No    Lack of Transportation (Non-Medical): No  Physical Activity: Unknown (11/14/2022)    Received from [facility], [facility]   Exercise Vital Sign    Days of Exercise per Week: 0 days    Minutes of Exercise per Session: Not on file  Stress: Stress Concern Present (11/14/2022)   Received from [facility], [facility] - Occupational Stress Questionnaire    Feeling of Stress :  Very much  Social Connections: Somewhat Isolated (11/14/2022)   Received from [facility], [facility]   Social Network    How would you rate your social network (family, work, friends)?: Restricted participation with some degree of social isolation  Intimate Partner Violence: Not At Risk (11/14/2022)   Received from [facility], [facility]   HITS    Over the last 12 months how often did your partner physically hurt you?: Never    Over the last 12 months how often did your partner insult you or talk down to you?: Never    Over the last 12 months how often did your partner threaten you with physical harm?: Never    Over the last 12 months how often did your partner scream or curse at you?: Never    FAMILY HISTORY:  Family History  Problem Relation Age of Onset   Bone cancer Mother    Brain cancer Father    Brain cancer Sister    Breast cancer Sister    Renal Disease Sister    Pancreatic cancer Neg Hx    Esophageal cancer Neg Hx    Stomach cancer Neg Hx    Rectal cancer Neg Hx    Colon polyps Neg Hx     CURRENT MEDICATIONS:  Outpatient Encounter Medications as of 12/24/2023  Medication Sig   acetaminophen (TYLENOL) 500 MG tablet Take 500 mg by mouth every 6 (six) hours as needed.   acetaminophen-codeine (TYLENOL #3) 300-30 MG tablet Take 1 tablet by mouth daily as needed for moderate pain (pain score 4-6).   Ascorbic Acid (VITAMIN C PO) Take by mouth.   ASPIRIN 81 PO Take 1 tablet by mouth daily.   atorvastatin (LIPITOR) 10 MG tablet Take by mouth.   azaTHIOprine (IMURAN) 50 MG tablet Take 0.5 tablets (25 mg total) by mouth daily.    BD SYRINGE SLIP TIP 25G X 5/8" 1 ML MISC USE AS DIRECTED FOR ONCE MONTHLY B12 INJECTIONS   Cholecalciferol (VITAMIN D3) 25 MCG (1000 UT) CAPS 1 capsule Orally Once a day for 30 day(s)   cyanocobalamin (,VITAMIN B-12,) 1000 MCG/ML injection Inject 1,000 mcg into the skin every 30 (thirty) days.   cyclobenzaprine (FLEXERIL) 5 MG tablet TAKE 1 TABLET(5 MG) BY MOUTH AT BEDTIME AS NEEDED FOR MUSCLE SPASMS   diclofenac Sodium (VOLTAREN) 1 % GEL SMARTSIG:Gram(s) Topical 4 Times Daily PRN   dicyclomine (BENTYL) 10 MG capsule Take 1 capsule (10 mg total) by mouth 4 (four) times daily as needed for spasms.   docusate sodium (COLACE) 100 MG capsule TAKE 1 CAPSULE(100 MG) BY MOUTH TWICE DAILY   DULoxetine (CYMBALTA) 20 MG capsule Take 20 mg by mouth 2 (two) times daily.   fentaNYL (DURAGESIC) 50 MCG/HR Place onto the skin every 3 (three) days. Patient states she does it every other day.   fentaNYL (DURAGESIC) 50 MCG/HR Place onto the skin.   folic acid (FOLVITE) 1 MG tablet Take 1 mg by mouth daily.   furosemide (LASIX) 20 MG tablet Take 20 mg by mouth daily as needed.   Heat Wraps (THERMACARE BACK/HIP) MISC 1 application by Does not apply route daily.   hydroxychloroquine (PLAQUENIL) 200 MG tablet Take daily 5 days out of the week   hydrOXYzine (ATARAX) 25 MG tablet Take 25 mg by mouth 2 (two) times daily as needed for itching.   hydrOXYzine (ATARAX) 50 MG tablet Take by mouth.   Insulin Pen Needle (SURE COMFORT PEN NEEDLES) 30G X 8 MM MISC Use  as directed   lidocaine (LIDODERM) 5 % one patch daily.   lipase/protease/amylase (CREON) 12000-38000 units CPEP capsule Use 1 capsule with meals and snacks   LORazepam (ATIVAN) 0.5 MG tablet TAKE 1/2 TO 1 TABLET BY MOUTH AT BEDTIME AS NEEDED   magnesium oxide (MAG-OX) 400 (240 Mg) MG tablet Take 1 tablet by mouth daily.   megestrol (MEGACE) 40 MG tablet Take 1 tablet (40 mg total) by mouth daily.   metoprolol tartrate (LOPRESSOR) 25 MG tablet    MYRBETRIQ 25 MG  TB24 tablet Take 25 mg by mouth daily.   MYRBETRIQ 50 MG TB24 tablet Take 50 mg by mouth daily.   Na Sulfate-K Sulfate-Mg Sulf 17.5-3.13-1.6 GM/177ML SOLN Take 1 kit by mouth as directed.   naloxone (NARCAN) nasal spray 4 mg/0.1 mL    naratriptan (AMERGE) 2.5 MG tablet Take 1 tablet (2.5 mg total) by mouth as needed for migraine. Take one (1) tablet at onset of headache; if returns or does not resolve, may repeat after 4 hours; do not exceed five (5) mg in 24 hours.   ondansetron (ZOFRAN-ODT) 4 MG disintegrating tablet Take 4 mg by mouth every 8 (eight) hours as needed.   pantoprazole (PROTONIX) 40 MG tablet TAKE 1 TABLET(40 MG) BY MOUTH TWICE DAILY   PARoxetine (PAXIL) 20 MG tablet Take 20 mg by mouth daily.   pregabalin (LYRICA) 25 MG capsule Take 1 capsule (25 mg total) by mouth 2 (two) times daily.   promethazine (PHENERGAN) 25 MG tablet Take 0.5-1 tablets (12.5-25 mg total) by mouth every 6 (six) hours as needed for nausea or vomiting.   sucralfate (CARAFATE) 1 GM/10ML suspension SHAKE LIQUID AND TAKE 10 ML(1 GRAM) BY MOUTH FOUR TIMES DAILY   Syringe/Needle, Disp, (SYRINGE 3CC/23GX1") 23G X 1" 3 ML MISC by Does not apply route.   traZODone (DESYREL) 100 MG tablet Take 1 tablet (100 mg total) by mouth at bedtime as needed for sleep.   Zoledronic Acid (RECLAST IV) Inject 5 mg into the vein. Once yearly   No facility-administered encounter medications on file as of 12/24/2023.    ALLERGIES:  Allergies  Allergen Reactions   Abatacept Other (See Comments)   Codeine Other (See Comments)    Stomach pains/constipation- due to IBS   Golimumab Other (See Comments)   Hydrocodone Other (See Comments)    She does not want to take this due to intolerance to codeine.   Methylprednisolone Diarrhea and Other (See Comments)    "Made my IBS flare up"  Diarrhea, nausea   Methotrexate Derivatives Other (See Comments)    Agitation; pt stated, "I get no sleep; one dose caused no sleep for 7 days and 7  nights - that was when I got a 0.5 injection"   Prednisone Rash    Cannot take prednisone by mouth ---- red rash all over  Patient states she got a rash one time     LABORATORY DATA:  I have reviewed the labs as listed.  CBC    Component Value Date/Time   WBC 10.0 12/24/2023 0933   RBC 3.74 (L) 12/24/2023 0933   HGB 10.5 (L) 12/24/2023 0933   HGB 11.5 10/07/2020 1321   HCT 33.2 (L) 12/24/2023 0933   HCT 34.0 10/07/2020 1321   PLT 458 (H) 12/24/2023 0933   PLT 462 (H) 10/07/2020 1321   MCV 88.8 12/24/2023 0933   MCV 91 10/07/2020 1321   MCH 28.1 12/24/2023 0933   MCHC 31.6 12/24/2023 0933   RDW 16.6 (  H) 12/24/2023 0933   RDW 13.7 10/07/2020 1321   LYMPHSABS 1.2 12/24/2023 0933   LYMPHSABS 2.9 10/07/2020 1321   MONOABS 0.7 12/24/2023 0933   EOSABS 0.1 12/24/2023 0933   EOSABS 0.1 10/07/2020 1321   BASOSABS 0.1 12/24/2023 0933   BASOSABS 0.1 10/07/2020 1321      Latest Ref Rng & Units 12/24/2023    9:33 AM 11/19/2023    1:13 PM 10/05/2023    1:27 PM  CMP  Glucose 70 - 99 mg/dL 161  096  99   BUN 8 - 23 mg/dL 10  9  9    Creatinine 0.44 - 1.00 mg/dL 0.45  4.09  8.11   Sodium 135 - 145 mmol/L 135  135  135   Potassium 3.5 - 5.1 mmol/L 3.8  3.3  3.5   Chloride 98 - 111 mmol/L 99  100  103   CO2 22 - 32 mmol/L 24  25  25    Calcium 8.9 - 10.3 mg/dL 8.9  8.9  8.3   Total Protein 6.5 - 8.1 g/dL 7.2  7.3  6.6   Total Bilirubin 0.0 - 1.2 mg/dL 0.2  0.3  0.2   Alkaline Phos 38 - 126 U/L 112  106  75   AST 15 - 41 U/L 16  20  17    ALT 0 - 44 U/L 8  11  11      DIAGNOSTIC IMAGING:  I have independently reviewed the relevant imaging and discussed with the patient.   WRAP UP:  All questions were answered. The patient knows to call the clinic with any problems, questions or concerns.  Medical decision making: Moderate  Time spent on visit: I spent 45 minutes counseling the patient face to face. The total time spent in the appointment was 60 minutes and more than 50% was on  counseling.  Carnella Guadalajara, PA-C  12/24/23 1:18 PM

## 2023-12-24 ENCOUNTER — Other Ambulatory Visit: Payer: Self-pay

## 2023-12-24 ENCOUNTER — Inpatient Hospital Stay: Payer: Medicare HMO

## 2023-12-24 ENCOUNTER — Inpatient Hospital Stay (HOSPITAL_BASED_OUTPATIENT_CLINIC_OR_DEPARTMENT_OTHER): Payer: Medicare HMO | Admitting: Physician Assistant

## 2023-12-24 ENCOUNTER — Telehealth: Payer: Self-pay

## 2023-12-24 DIAGNOSIS — D500 Iron deficiency anemia secondary to blood loss (chronic): Secondary | ICD-10-CM | POA: Diagnosis not present

## 2023-12-24 DIAGNOSIS — D509 Iron deficiency anemia, unspecified: Secondary | ICD-10-CM | POA: Diagnosis not present

## 2023-12-24 LAB — COMPREHENSIVE METABOLIC PANEL
ALT: 8 U/L (ref 0–44)
AST: 16 U/L (ref 15–41)
Albumin: 3.2 g/dL — ABNORMAL LOW (ref 3.5–5.0)
Alkaline Phosphatase: 112 U/L (ref 38–126)
Anion gap: 12 (ref 5–15)
BUN: 10 mg/dL (ref 8–23)
CO2: 24 mmol/L (ref 22–32)
Calcium: 8.9 mg/dL (ref 8.9–10.3)
Chloride: 99 mmol/L (ref 98–111)
Creatinine, Ser: 0.6 mg/dL (ref 0.44–1.00)
GFR, Estimated: >60 mL/min (ref >60)
Glucose, Bld: 102 mg/dL — ABNORMAL HIGH (ref 70–99)
Potassium: 3.8 mmol/L (ref 3.5–5.1)
Sodium: 135 mmol/L (ref 135–145)
Total Bilirubin: 0.2 mg/dL (ref 0.0–1.2)
Total Protein: 7.2 g/dL (ref 6.5–8.1)

## 2023-12-24 LAB — SAMPLE TO BLOOD BANK

## 2023-12-24 LAB — CBC WITH DIFFERENTIAL/PLATELET
Abs Immature Granulocytes: 0.07 10*3/uL (ref 0.00–0.07)
Basophils Absolute: 0.1 10*3/uL (ref 0.0–0.1)
Basophils Relative: 1 %
Eosinophils Absolute: 0.1 10*3/uL (ref 0.0–0.5)
Eosinophils Relative: 1 %
HCT: 33.2 % — ABNORMAL LOW (ref 36.0–46.0)
Hemoglobin: 10.5 g/dL — ABNORMAL LOW (ref 12.0–15.0)
Immature Granulocytes: 1 %
Lymphocytes Relative: 12 %
Lymphs Abs: 1.2 10*3/uL (ref 0.7–4.0)
MCH: 28.1 pg (ref 26.0–34.0)
MCHC: 31.6 g/dL (ref 30.0–36.0)
MCV: 88.8 fL (ref 80.0–100.0)
Monocytes Absolute: 0.7 10*3/uL (ref 0.1–1.0)
Monocytes Relative: 7 %
Neutro Abs: 8 10*3/uL — ABNORMAL HIGH (ref 1.7–7.7)
Neutrophils Relative %: 78 %
Platelets: 458 10*3/uL — ABNORMAL HIGH (ref 150–400)
RBC: 3.74 MIL/uL — ABNORMAL LOW (ref 3.87–5.11)
RDW: 16.6 % — ABNORMAL HIGH (ref 11.5–15.5)
WBC: 10 10*3/uL (ref 4.0–10.5)
nRBC: 0 % (ref 0.0–0.2)

## 2023-12-24 LAB — IRON AND TIBC
Iron: 23 ug/dL — ABNORMAL LOW (ref 28–170)
Saturation Ratios: 11 % (ref 10.4–31.8)
TIBC: 214 ug/dL — ABNORMAL LOW (ref 250–450)
UIBC: 191 ug/dL

## 2023-12-24 LAB — FERRITIN: Ferritin: 133 ng/mL (ref 11–307)

## 2023-12-24 LAB — LACTATE DEHYDROGENASE: LDH: 130 U/L (ref 98–192)

## 2023-12-24 NOTE — Patient Instructions (Signed)
 [facility] at [facility] **VISIT SUMMARY & IMPORTANT INSTRUCTIONS **   You were seen today by Rojelio Brenner PA-C for your anemia and other blood abnormalities.    ANEMIA (low hemoglobin): Your bone marrow biopsy did NOT show any evidence of bone marrow or blood cancer!  This means that we can say with certainty that your anemia is caused by the following: CHRONIC INFLAMMATION: Chronic inflammation from your rheumatoid arthritis makes it more difficult for your body to absorb iron or process iron correctly.  This makes it more difficult for your body to make new blood cells. PLAN >> Continue close follow-up with rheumatology for management of your rheumatoid arthritis. POSSIBLE GASTROINTESTINAL BLEEDING: Even though endoscopy and colonoscopy have not shown any active bleeding, I suspect that you have intermittent blood loss from your stomach and intestines.  I suspect this because you continue to have low iron even after iron infusions, and you have some drops in your blood count, and your stool testing has showed blood in the past. PLAN >> continue close follow-up with gastroenterology and seek immediate medical attention if you have any evidence of active bleeding (such as bright red blood in the toilet or black tarry bowel movements) POSSIBLE MEDICATION SIDE EFFECT: It is possible that your Plaquenil is causing anemia.  WHAT WE CAN DO FOR YOUR ANEMIA: We will check your blood count every other week to make sure it does not drop too low too quickly. If your blood count is <8.0, we will schedule you for blood transfusion. We will schedule you for 3 doses of IV iron.  ELEVATED WHITE BLOOD CELLS & PLATELETS: Your white blood cells and platelets are most likely elevated related to your chronic inflammation as well as your steroid medications.  Previous testing of your blood did not show any evidence of blood cancer.  COPING WITH CHRONIC DISEASE: Your severe chronic  disease and pain can take a toll on your physical wellbeing, as well as your emotional, mental, and spiritual health. Continue following closely with Palliative Care for symptom management needs. Try an online counseling service or therapist (such as BetterHelp or similar service) that can be found online.  Counseling focused on chronic disease has been shown to improve symptoms and help with coping strategies! Please see the attached handout for further advice on coping with chronic disease.   FOLLOW UP PLAN: - IV iron every other week x 3 doses - Blood check every 2 weeks - Labs and office visit in 3 months     ** Thank you for trusting me with your healthcare!  I strive to provide all of my patients with quality care at each visit.  If you receive a survey for this visit, I would be so grateful to you for taking the time to provide feedback.  Thank you in advance!  ~ Fadumo Heng                   Dr. Doreatha Massed   &   Rojelio Brenner, PA-C   - - - - - - - - - - - - - - - - - -    Thank you for choosing [facility] at [facility] to provide your oncology and hematology care.  To afford each patient quality time with our provider, please arrive at least 15 minutes before your scheduled appointment time.   If you have a lab appointment with the Cancer Center please come in thru  the Main Entrance and check in at the main information desk.  You need to re-schedule your appointment should you arrive 10 or more minutes late.  We strive to give you quality time with our providers, and arriving late affects you and other patients whose appointments are after yours.  Also, if you no show three or more times for appointments you may be dismissed from the clinic at the providers discretion.     Again, thank you for choosing [facility].  Our hope is that these requests will decrease the amount of time that you wait before being seen by our physicians.        _____________________________________________________________  Should you have questions after your visit to [facility], please contact our office at [phone] and follow the prompts.  Our office hours are 8:00 a.m. and 4:30 p.m. Monday - Friday.  Please note that voicemails left after 4:00 p.m. may not be returned until the following business day.  We are closed weekends and major holidays.  You do have access to a nurse 24-7, just call the main number to the clinic [phone] and do not press any options, hold on the line and a nurse will answer the phone.    For prescription refill requests, have your pharmacy contact our office and allow 72 hours.

## 2023-12-24 NOTE — Telephone Encounter (Signed)
 Auth Submission: NO AUTH NEEDED Site of care: Site of care: [facility] Payer: [employer] Medication & CPT/J Code(s) submitted: Venofer (Iron Sucrose) J1756 Route of submission (phone, fax, portal): portal Phone # Fax # Auth type: Buy/Bill PB Units/visits requested: 200mg , 5 doses Reference number:  Approval from: 12/24/23 to 06/01/24

## 2023-12-25 ENCOUNTER — Ambulatory Visit (INDEPENDENT_AMBULATORY_CARE_PROVIDER_SITE_OTHER): Admitting: Sports Medicine

## 2023-12-25 ENCOUNTER — Other Ambulatory Visit (INDEPENDENT_AMBULATORY_CARE_PROVIDER_SITE_OTHER): Payer: Self-pay

## 2023-12-25 ENCOUNTER — Encounter: Payer: Self-pay | Admitting: Sports Medicine

## 2023-12-25 DIAGNOSIS — G8929 Other chronic pain: Secondary | ICD-10-CM | POA: Diagnosis not present

## 2023-12-25 DIAGNOSIS — M542 Cervicalgia: Secondary | ICD-10-CM

## 2023-12-25 DIAGNOSIS — M47812 Spondylosis without myelopathy or radiculopathy, cervical region: Secondary | ICD-10-CM

## 2023-12-25 DIAGNOSIS — M7552 Bursitis of left shoulder: Secondary | ICD-10-CM

## 2023-12-25 DIAGNOSIS — M25511 Pain in right shoulder: Secondary | ICD-10-CM

## 2023-12-25 DIAGNOSIS — M25512 Pain in left shoulder: Secondary | ICD-10-CM | POA: Diagnosis not present

## 2023-12-25 DIAGNOSIS — M7551 Bursitis of right shoulder: Secondary | ICD-10-CM

## 2023-12-25 DIAGNOSIS — M0579 Rheumatoid arthritis with rheumatoid factor of multiple sites without organ or systems involvement: Secondary | ICD-10-CM

## 2023-12-25 MED ORDER — METHYLPREDNISOLONE ACETATE 40 MG/ML IJ SUSP
40.0000 mg | INTRAMUSCULAR | Status: AC | PRN
  Administered 2023-12-25: 40 mg via INTRA_ARTICULAR

## 2023-12-25 MED ORDER — BUPIVACAINE HCL 0.25 % IJ SOLN
1.0000 mL | INTRAMUSCULAR | Status: AC | PRN
  Administered 2023-12-25: 1 mL via INTRA_ARTICULAR

## 2023-12-25 MED ORDER — LIDOCAINE HCL 1 % IJ SOLN
1.0000 mL | INTRAMUSCULAR | Status: AC | PRN
  Administered 2023-12-25: 1 mL

## 2023-12-25 NOTE — Progress Notes (Signed)
 Patient says she is having pain in her shoulders/back in the same places that she has had injections previously. She says that it feels like it is in the back where the scapula rounds at the bottom.

## 2023-12-25 NOTE — Progress Notes (Signed)
 PHYILLIS Lara - 68 y.o. female MRN [id number]  Date of birth: 1956-03-23  Office Visit Note: Visit Date: 12/25/2023 PCP: [name], NP Referred by: [name], NP  Subjective: Chief Complaint  Patient presents with   Left Shoulder - Pain   Right Shoulder - Pain   HPI: Vanessa Lara is a pleasant 68 y.o. female who presents today for bilateral posterior shoulder pain with history of scapulothoracic bursitis.  Vanessa Lara is having recurrence of her pain in her shoulders and the posterior aspect of the shoulder blade.  She did have some injections into the shoulders for her bicep tendinitis with Karenann Cai back on 12/14/2023.  Back about 4 months ago we did give her scapulothoracic injections which have been the only thing that has helped her posterior shoulder pain.  She does have a long history of multiple joint pain with myalgias and a history of rheumatoid arthritis.  She is on Plaquenil 200 mg daily, 5/7 days of the week with rheumatology.  She does use topical Voltaren gel as well as lidocaine patches.  She is also having some pain at the top of her neck just at the base of the skull that feels like grating or grinding.  No specific injury.  For chronic pain She is on Fentanyl 50 mcg/hr patch through a hospice care team.  Pertinent ROS were reviewed with the patient and found to be negative unless otherwise specified above in HPI.   Assessment & Plan: Visit Diagnoses:  1. Chronic pain of both shoulders   2. Neck pain   3. Spondylosis of cervical region without myelopathy or radiculopathy   4. Scapulothoracic bursitis of left shoulder   5. Scapulothoracic bursitis of right shoulder    Plan: Guelda is dealing with an exacerbation of her chronic bilateral shoulder pain, which is multifactorial but her symptoms are suggestive of scapulothoracic bursitis with scapular dysfunction of bilateral shoulders.  The only thing that has been helpful in the past is injection into this  location of the shoulder.  We did repeat this today for discussion on treatment options, patient tolerated well.  She also has been having some neck pain, we did evaluate her prior MRI and obtain an x-ray today which shows arthritic change and cervical DDD near the C4-C6 region, but no acute findings.  I do think she is having some paraspinal hypertonicity which is driving her pain as well.  She may use heat, recommended IcyHot or other topical medication such as Voltaren gel as well as her lidocaine patches.  Discussed cervical isometrics.  For her general pain, she may continue her  Fentanyl 50 mcg/hr patch through a hospice care team for her chronic pain.  Also will continue her Plaquenil 200 mg daily for her underlying rheumatologic disease and pain.  She will follow-up with me on a 61-month basis, we can consider repeat injections if she continues to find these helpful at infrequent intervals.  She will continue her follow-up with Karenann Cai to evaluate her knee and discuss next steps.  Follow-up: Return in about 3 months (around 03/26/2024) for b/l shoulders (30-min for procedure).   Meds & Orders: No orders of the defined types were placed in this encounter.   Orders Placed This Encounter  Procedures   Large Joint Inj: L glenohumeral   Large Joint Inj: R glenohumeral   XR Cervical Spine 2 or 3 views     Procedures: Large Joint Inj: L glenohumeral on 12/25/2023 2:22 PM Indications: pain Details:  25 G 1.5 in needle, posterior approach Medications: 1 mL lidocaine 1 %; 1 mL bupivacaine 0.25 %; 40 mg methylPREDNISolone acetate 40 MG/ML Outcome: tolerated well, no immediate complications  Procedure: Left-shoulder scapulothoracic bursa injection - procedurally successful injection. Patient in-prone position with needle from medial to lateral direction over inferior border of scapula. Procedure, treatment alternatives, risks and benefits explained, specific risks discussed. Consent was given by  the patient. Immediately prior to procedure a time out was called to verify the correct patient, procedure, equipment, support staff and site/side marked as required. Patient was prepped and draped in the usual sterile fashion.    Large Joint Inj: R glenohumeral on 12/25/2023 2:23 PM Indications: pain Details: 25 G 1.5 in needle, posterior approach Medications: 1 mL lidocaine 1 %; 1 mL bupivacaine 0.25 %; 40 mg methylPREDNISolone acetate 40 MG/ML Outcome: tolerated well, no immediate complications  Procedure: Right-shoulder scapulothoracic bursa injection - procedurally successful injection. Patient in-prone position with needle from medial to lateral direction over inferior border of scapula. Procedure, treatment alternatives, risks and benefits explained, specific risks discussed. Consent was given by the patient. Immediately prior to procedure a time out was called to verify the correct patient, procedure, equipment, support staff and site/side marked as required. Patient was prepped and draped in the usual sterile fashion.          Clinical History: MRI CERVICAL SPINE WITHOUT CONTRAST   TECHNIQUE: Multiplanar, multisequence MR imaging of the cervical spine was performed. No intravenous contrast was administered.   COMPARISON:  None.   FINDINGS: Alignment: 2 mm retrolisthesis of C5 on C6.   Vertebrae: No acute fracture, evidence of discitis, or bone lesion.   Cord: Normal signal and morphology.   Posterior Fossa, vertebral arteries, paraspinal tissues: Posterior fossa demonstrates no focal abnormality. Vertebral artery flow voids are maintained. Paraspinal soft tissues are unremarkable.   Disc levels:   Discs: Degenerative disease with disc height loss at C4-5 and C5-6.   C2-3: No significant disc bulge. No neural foraminal stenosis. No central canal stenosis.   C3-4: Mild broad-based disc bulge. No foraminal or central canal stenosis.   C4-5: Mild broad-based disc  bulge. No foraminal or central canal stenosis.   C5-6: Mild broad-based disc bulge. Bilateral uncovertebral degenerative changes. Moderate left and severe right foraminal stenosis. No spinal stenosis.   C6-7: Mild broad-based disc bulge. No foraminal or central canal stenosis.   C7-T1: No significant disc bulge. No neural foraminal stenosis. No central canal stenosis.   IMPRESSION: 1. At C5-6 there is a mild broad-based disc bulge. Bilateral uncovertebral degenerative changes. Moderate left and severe right foraminal stenosis. 2. Otherwise, mild cervical spine spondylosis as described above.     Electronically Signed   By: Elige Ko M.D.   On: 12/16/2021 10:51  She reports that she has been smoking cigarettes. She started smoking about 45 years ago. She has a 16.5 pack-year smoking history. She has never used smokeless tobacco. No results for input(s): "HGBA1C", "LABURIC" in the last 8760 hours.  Objective:    Physical Exam  Gen: Well-appearing, in no acute distress; non-toxic CV: Well-perfused. Warm.  Resp: Breathing unlabored on room air; no wheezing. Psych: Fluid speech in conversation; appropriate affect; normal thought process  Ortho Exam - Neck: There is some pain just distal to the occiput on the left and right side with some paraspinal hypertonicity.  There is full range of motion with flexion and extension, negative Spurling's test.  - Bilateral shoulders/scapula: There is pain palpating  over the medial border of bilateral scapulas with a degree of dysfunction.  There is some crepitus taking the shoulder blade through range of motion bilaterally, likely indicative of scapulothoracic bursitis.  There is associated trigger points with tenderness about the musculature of the medial border bilaterally.  Imaging: XR Cervical Spine 2 or 3 views Result Date: 12/25/2023 2 views of the cervical spine including AP and lateral film were ordered and reviewed by myself.  X-rays  demonstrate mild to moderate arthritic change as well as cervical DDD most notable at the C4-C6 region.  There is some anterior osteophyte complex and spurring noted.  Very mild C5 on C6 retrolisthesis which is equivocal to her MRI of the cervical spine in 2024.   Past Medical/Family/Surgical/Social History: Medications & Allergies reviewed per EMR, new medications updated. Patient Active Problem List   Diagnosis Date Noted   High risk medication use 01/16/2023   Medication monitoring encounter 12/05/2021   Physical deconditioning 11/11/2021   Elevated serum lactate dehydrogenase (LDH) 08/17/2021   Vitamin D deficiency 08/17/2021   Chronic fatigue 07/25/2021   Vitamin B12 deficiency 07/19/2021   Hardening of the aorta (main artery of the heart) (HCC) 06/21/2021   Solitary lung nodule 06/15/2021   Loss of weight 05/18/2021   Protein-calorie malnutrition, severe (HCC) 05/18/2021   Osteoporosis 06/15/2020   Iron deficiency anemia 04/09/2020   Primary osteoarthritis involving multiple joints 12/31/2018   Rheumatoid arthritis involving multiple sites with positive rheumatoid factor (HCC) 12/31/2018   Irritable bowel syndrome with both constipation and diarrhea 10/28/2018   Constipation 03/14/2016   Mitral valve prolapse 11/23/2015   High cholesterol 11/23/2015   Past Medical History:  Diagnosis Date   Anemia    Anxiety    Arthritis    RA   Bursitis    Cataract    CHF (congestive heart failure) (HCC)    Collagen vascular disease (HCC)    Depression    Endometriosis    Heart murmur    High cholesterol    Hypertension    IBS (irritable bowel syndrome)    Lymphadenopathy    Mitral valve prolapse    Osteoporosis    RA (rheumatoid arthritis) (HCC)    Rotator cuff tear    bilateral   Stroke (HCC) 06/2022   Uterus, adenomyosis    Family History  Problem Relation Age of Onset   Bone cancer Mother    Brain cancer Father    Brain cancer Sister    Breast cancer Sister     Renal Disease Sister    Pancreatic cancer Neg Hx    Esophageal cancer Neg Hx    Stomach cancer Neg Hx    Rectal cancer Neg Hx    Colon polyps Neg Hx    Past Surgical History:  Procedure Laterality Date   AXILLARY LYMPH NODE BIOPSY Right 10/12/2020   Procedure: RIGHT AXILLARY LYMPH NODE BIOPSY EXCISION;  Surgeon: Almond Lint, MD;  Location: [facility];  Service: General;  Laterality: Right;   BUNIONECTOMY Right    CARPAL TUNNEL RELEASE Right    COLONOSCOPY     COLONOSCOPY N/A 12/13/2015   Procedure: COLONOSCOPY;  Surgeon: Malissa Hippo, MD;  Location: [facility];  Service: Endoscopy;  Laterality: N/A;  9:55   Fatty tumor     2011 (left arm)   TONSILLECTOMY     TOTAL ABDOMINAL HYSTERECTOMY     2003   Social History   Occupational History   Occupation: retired  Tobacco Use  Smoking status: Some Days    Current packs/day: 0.00    Average packs/day: 0.5 packs/day for 33.0 years (16.5 ttl pk-yrs)    Types: Cigarettes    Start date: 60    Last attempt to quit: 2013    Years since quitting: 12.2   Smokeless tobacco: Never  Vaping Use   Vaping status: Never Used  Substance and Sexual Activity   Alcohol use: No    Alcohol/week: 0.0 standard drinks of alcohol   Drug use: No   Sexual activity: Not Currently

## 2023-12-27 ENCOUNTER — Encounter: Attending: Gastroenterology

## 2023-12-27 VITALS — BP 107/54 | HR 97 | Temp 97.7°F | Resp 18

## 2023-12-27 DIAGNOSIS — D500 Iron deficiency anemia secondary to blood loss (chronic): Secondary | ICD-10-CM | POA: Insufficient documentation

## 2023-12-27 DIAGNOSIS — D509 Iron deficiency anemia, unspecified: Secondary | ICD-10-CM

## 2023-12-27 MED ORDER — IRON SUCROSE 20 MG/ML IV SOLN
200.0000 mg | Freq: Once | INTRAVENOUS | Status: AC
  Administered 2023-12-27: 200 mg via INTRAVENOUS

## 2023-12-27 NOTE — Progress Notes (Signed)
 Diagnosis: Iron Deficiency Anemia  Provider:   Buford Dresser, R  Procedure: IV Push  IV Type: Peripheral, IV Location: R Antecubital  Venofer (Iron Sucrose), Dose: 200 mg  Post Infusion IV Care: Observation period completed and Peripheral IV Discontinued  Discharge: Condition: Good, Destination: Home . AVS Provided  Performed by:  Arrie Senate, RN

## 2023-12-28 NOTE — Progress Notes (Deleted)
 Vanessa Lara, female    DOB: 1955-12-16    MRN: [id number]   Brief patient profile:  68  yo*** *** referred to pulmonary clinic in [place]  12/31/2023 by *** for ***      History of Present Illness  12/31/2023  Pulmonary/ 1st office eval/ Sherene Sires / [place] Office  No chief complaint on file.    Dyspnea:  *** Cough: *** Sleep: *** SABA use: *** 02: *** LDSCT:***  No obvious day to day or daytime pattern/variability or assoc excess/ purulent sputum or mucus plugs or hemoptysis or cp or chest tightness, subjective wheeze or overt sinus or hb symptoms.    Also denies any obvious fluctuation of symptoms with weather or environmental changes or other aggravating or alleviating factors except as outlined above   No unusual exposure hx or h/o childhood pna/ asthma or knowledge of premature birth.  Current Allergies, Complete Past Medical History, Past Surgical History, Family History, and Social History were reviewed in [name] record.  ROS  The following are not active complaints unless bolded Hoarseness, sore throat, dysphagia, dental problems, itching, sneezing,  nasal congestion or discharge of excess mucus or purulent secretions, ear ache,   fever, chills, sweats, unintended wt loss or wt gain, classically pleuritic or exertional cp,  orthopnea pnd or arm/hand swelling  or leg swelling, presyncope, palpitations, abdominal pain, anorexia, nausea, vomiting, diarrhea  or change in bowel habits or change in bladder habits, change in stools or change in urine, dysuria, hematuria,  rash, arthralgias, visual complaints, headache, numbness, weakness or ataxia or problems with walking or coordination,  change in mood or  memory.            Outpatient Medications Prior to Visit  Medication Sig Dispense Refill   acetaminophen (TYLENOL) 500 MG tablet Take 500 mg by mouth every 6 (six) hours as needed.     acetaminophen-codeine (TYLENOL #3) 300-30 MG tablet  Take 1 tablet by mouth daily as needed for moderate pain (pain score 4-6). 10 tablet 0   Ascorbic Acid (VITAMIN C PO) Take by mouth.     ASPIRIN 81 PO Take 1 tablet by mouth daily.     atorvastatin (LIPITOR) 10 MG tablet Take by mouth.     azaTHIOprine (IMURAN) 50 MG tablet Take 0.5 tablets (25 mg total) by mouth daily. 30 tablet 0   BD SYRINGE SLIP TIP 25G X 5/8" 1 ML MISC USE AS DIRECTED FOR ONCE MONTHLY B12 INJECTIONS 10 each 0   Cholecalciferol (VITAMIN D3) 25 MCG (1000 UT) CAPS 1 capsule Orally Once a day for 30 day(s)     cyanocobalamin (,VITAMIN B-12,) 1000 MCG/ML injection Inject 1,000 mcg into the skin every 30 (thirty) days.     cyclobenzaprine (FLEXERIL) 5 MG tablet TAKE 1 TABLET(5 MG) BY MOUTH AT BEDTIME AS NEEDED FOR MUSCLE SPASMS 30 tablet 0   diclofenac Sodium (VOLTAREN) 1 % GEL SMARTSIG:Gram(s) Topical 4 Times Daily PRN     dicyclomine (BENTYL) 10 MG capsule Take 1 capsule (10 mg total) by mouth 4 (four) times daily as needed for spasms. 360 capsule 1   docusate sodium (COLACE) 100 MG capsule TAKE 1 CAPSULE(100 MG) BY MOUTH TWICE DAILY 60 capsule 2   DULoxetine (CYMBALTA) 20 MG capsule Take 20 mg by mouth 2 (two) times daily.     fentaNYL (DURAGESIC) 50 MCG/HR Place onto the skin every 3 (three) days. Patient states she does it every other day.     fentaNYL (  DURAGESIC) 50 MCG/HR Place onto the skin.     folic acid (FOLVITE) 1 MG tablet Take 1 mg by mouth daily.     furosemide (LASIX) 20 MG tablet Take 20 mg by mouth daily as needed.     Heat Wraps (THERMACARE BACK/HIP) MISC 1 application by Does not apply route daily. 3 each 11   hydroxychloroquine (PLAQUENIL) 200 MG tablet Take daily 5 days out of the week 25 tablet 2   hydrOXYzine (ATARAX) 25 MG tablet Take 25 mg by mouth 2 (two) times daily as needed for itching.     hydrOXYzine (ATARAX) 50 MG tablet Take by mouth.     Insulin Pen Needle (SURE COMFORT PEN NEEDLES) 30G X 8 MM MISC Use as directed 100 each 1   lidocaine  (LIDODERM) 5 % one patch daily.     lipase/protease/amylase (CREON) 12000-38000 units CPEP capsule Use 1 capsule with meals and snacks 270 capsule 1   LORazepam (ATIVAN) 0.5 MG tablet TAKE 1/2 TO 1 TABLET BY MOUTH AT BEDTIME AS NEEDED 60 tablet 0   magnesium oxide (MAG-OX) 400 (240 Mg) MG tablet Take 1 tablet by mouth daily.     megestrol (MEGACE) 40 MG tablet Take 1 tablet (40 mg total) by mouth daily. 30 tablet 6   metoprolol tartrate (LOPRESSOR) 25 MG tablet      MYRBETRIQ 25 MG TB24 tablet Take 25 mg by mouth daily.     MYRBETRIQ 50 MG TB24 tablet Take 50 mg by mouth daily.     Na Sulfate-K Sulfate-Mg Sulf 17.5-3.13-1.6 GM/177ML SOLN Take 1 kit by mouth as directed. 354 mL 0   naloxone (NARCAN) nasal spray 4 mg/0.1 mL      naratriptan (AMERGE) 2.5 MG tablet Take 1 tablet (2.5 mg total) by mouth as needed for migraine. Take one (1) tablet at onset of headache; if returns or does not resolve, may repeat after 4 hours; do not exceed five (5) mg in 24 hours. 10 tablet 6   ondansetron (ZOFRAN-ODT) 4 MG disintegrating tablet Take 4 mg by mouth every 8 (eight) hours as needed.     pantoprazole (PROTONIX) 40 MG tablet TAKE 1 TABLET(40 MG) BY MOUTH TWICE DAILY 180 tablet 3   PARoxetine (PAXIL) 20 MG tablet Take 20 mg by mouth daily.     pregabalin (LYRICA) 25 MG capsule Take 1 capsule (25 mg total) by mouth 2 (two) times daily. 60 capsule 5   promethazine (PHENERGAN) 25 MG tablet Take 0.5-1 tablets (12.5-25 mg total) by mouth every 6 (six) hours as needed for nausea or vomiting. 20 tablet 0   sucralfate (CARAFATE) 1 GM/10ML suspension SHAKE LIQUID AND TAKE 10 ML(1 GRAM) BY MOUTH FOUR TIMES DAILY     Syringe/Needle, Disp, (SYRINGE 3CC/23GX1") 23G X 1" 3 ML MISC by Does not apply route.     traZODone (DESYREL) 100 MG tablet Take 1 tablet (100 mg total) by mouth at bedtime as needed for sleep. 30 tablet 6   Zoledronic Acid (RECLAST IV) Inject 5 mg into the vein. Once yearly     No facility-administered  medications prior to visit.    Past Medical History:  Diagnosis Date   Anemia    Anxiety    Arthritis    RA   Bursitis    Cataract    CHF (congestive heart failure) (HCC)    Collagen vascular disease (HCC)    Depression    Endometriosis    Heart murmur    High cholesterol  Hypertension    IBS (irritable bowel syndrome)    Lymphadenopathy    Mitral valve prolapse    Osteoporosis    RA (rheumatoid arthritis) (HCC)    Rotator cuff tear    bilateral   Stroke (HCC) 06/2022   Uterus, adenomyosis       Objective:     There were no vitals taken for this visit.         Assessment   No problem-specific Assessment & Plan notes found for this encounter.     Sandrea Hughs, MD 12/28/2023

## 2023-12-31 ENCOUNTER — Ambulatory Visit: Admitting: Internal Medicine

## 2023-12-31 ENCOUNTER — Ambulatory Visit: Payer: Medicare HMO | Admitting: Internal Medicine

## 2024-01-01 ENCOUNTER — Ambulatory Visit: Admitting: Orthopedic Surgery

## 2024-01-03 ENCOUNTER — Ambulatory Visit: Admitting: Orthopedic Surgery

## 2024-01-07 ENCOUNTER — Encounter: Payer: Self-pay | Admitting: Internal Medicine

## 2024-01-07 ENCOUNTER — Encounter: Payer: Medicare HMO | Attending: Gastroenterology | Admitting: Internal Medicine

## 2024-01-07 VITALS — BP 108/68 | HR 109 | Resp 16 | Ht 60.0 in | Wt <= 1120 oz

## 2024-01-07 DIAGNOSIS — M0579 Rheumatoid arthritis with rheumatoid factor of multiple sites without organ or systems involvement: Secondary | ICD-10-CM | POA: Insufficient documentation

## 2024-01-07 DIAGNOSIS — M810 Age-related osteoporosis without current pathological fracture: Secondary | ICD-10-CM | POA: Insufficient documentation

## 2024-01-07 DIAGNOSIS — D500 Iron deficiency anemia secondary to blood loss (chronic): Secondary | ICD-10-CM | POA: Insufficient documentation

## 2024-01-07 DIAGNOSIS — E43 Unspecified severe protein-calorie malnutrition: Secondary | ICD-10-CM | POA: Insufficient documentation

## 2024-01-07 DIAGNOSIS — E559 Vitamin D deficiency, unspecified: Secondary | ICD-10-CM | POA: Insufficient documentation

## 2024-01-07 DIAGNOSIS — M150 Primary generalized (osteo)arthritis: Secondary | ICD-10-CM | POA: Insufficient documentation

## 2024-01-07 DIAGNOSIS — R5381 Other malaise: Secondary | ICD-10-CM | POA: Diagnosis not present

## 2024-01-07 DIAGNOSIS — D509 Iron deficiency anemia, unspecified: Secondary | ICD-10-CM | POA: Insufficient documentation

## 2024-01-07 MED ORDER — TIZANIDINE HCL 4 MG PO TABS: 4.0000 mg | ORAL_TABLET | Freq: Three times a day (TID) | ORAL | 1 refills | Status: DC | PRN

## 2024-01-07 MED ORDER — HYDROXYCHLOROQUINE SULFATE 200 MG PO TABS: ORAL_TABLET | ORAL | 2 refills | Status: DC

## 2024-01-07 NOTE — Progress Notes (Signed)
 Office Visit Note  Patient: Vanessa Lara             Date of Birth: 1956/07/12           MRN: [id number]             PCP: Lorre Rosin, NP Referring: Lorre Rosin, NP Visit Date: 01/07/2024   Subjective:  Follow-up (Patient states she would like labs drawn today because she thinks everything is out of wack. Patient states she knows of two medications that may help her. Patient states she needs a refill of hydroxychloroquine. )   Discussed the use of AI scribe software for clinical note transcription with the patient, who gave verbal consent to proceed.  History of Present Illness   Vanessa Lara is a 68 year old female with rheumatoid arthritis on HCQ 200 mg daily x5 days weekly who presents with severe pain and medication intolerance.  She experiences severe pain due to arthritis, exacerbated by muscle spasms in her back. The pain is intense and significantly impacts her quality of life, leaving her feeling 'miserable' and without a 'happy place'. Flare-ups occur every other day or every third day, preventing her from performing daily activities.  She has a history of intolerance to several medications. Azathioprine caused severe gastrointestinal issues, and she is currently on hydroxychloroquine for arthritis. She cannot take codeine due to gastrointestinal side effects related to her IBS. Muscle relaxants like Flexeril have been ineffective in managing her symptoms.  A recent bone marrow study was performed without anesthesia due to low blood pressure, which she found distressing. She experiences dizziness throughout the day, which she suspects may be related to anemia or other underlying issues. She has gained a small amount of weight recently, about four ounces.  She has been unable to follow up with an orthopedist due to her condition flaring up, making it difficult for her to travel. She also struggles with arranging transportation as her plans often fall through at the  last minute.    Previous HPI 11/19/23 Vanessa Lara is a 68 year old female with rheumatoid arthritis on hydroxychloroquine 200 mg once daily for 5 days per week.   She experiences significant pain and stiffness, particularly on Sundays, leading her to take an additional half dose of her medication on Sundays for the past two weekends. She describes waking up with severe stiffness and being unable to move by Monday if she does not take the extra dose. She is seeking an alternative medication to manage her symptoms more effectively.   She describes pain associated with bursitis and possible rotator cuff issues, noting a pressure point that radiates from her shoulder down towards her armpit and into her upper arm, forming a 'V' shape. She is scheduled to receive an injection for this pain.   She reports issues with her foot, including a previously twisted or sprained ankle that healed but then became painful and red, particularly around a bone that feels bruised. She also mentions a rod in her big toe that has shifted, causing pain and misalignment of her toes. She was previously told that surgery could correct her toes, but now her arthritis and varicose veins have worsened, raising concerns about circulation, infection, and potential amputation. Her bone density is very low, complicating surgical options.   She mentions her knuckles are sore, with one being particularly bad, and she experiences pain that radiates down her ear. She also notes fluctuations in her weight, questioning whether it is  due to scale differences or her condition.      Previous HPI 07/04/23 Vanessa Lara is a 68 y.o. female here for follow up for seropositive RA and osteoarthritis and osteoporosis she stopped hydroxychloroquine after last visit due to concern about cardiac arrhythmia alongside Paxil and duloxetine.  She was only able to take a low-dose of the medication she did feel it improved her overall condition but still  had persistent arthritis of numerous areas regardless.  She had an updated bone density scan on July 15 still with osteoporosis of the lumbar spine although better than 2022 and with femur osteopenia similar to the previous test.  She is taking Venofer for iron deficiency anemia this ongoing to correct it with her goal of enough to tolerate foot surgery.  She underwent endoscopy with no upper or lower GI findings to account for heme positive stool. Acute problem today increased right ankle pain and swelling started since 3 weeks ago.  She did not recall any fall trip or other trauma to that area.  Initially there was a high degree of swelling he put a wrap around this and can see prominent indentation the swelling has largely decreased compared to the outset but still has pain with direct pressure and with weightbearing.   Imaging reviewed DEXA Review AP Spine L1-L2 04/16/23  -3.8    0.712 g/cm2 04/12/21  -4.5    0.627 g/cm2 04/13/20  -4.2    0.665 g/cm2   DualFemur Total Mean 04/16/23  -2.6    0.680 g/cm2 04/12/21  -2.6    0.676 g/cm2 04/13/20  -2.2    0.736 g/cm2     03/08/23 Vanessa Lara is a 68 y.o. female here for follow up for seropositive RA.  Currently off any disease specific medication she cannot tolerate taking the sulfasalazine due to severe stomach irritation described as burning up her stomach.  Continues having joint pain and stiffness on a daily basis overall feels her function is very poor due to the severity.  Had labs last month still anemic at hemoglobin of 9.8 being treated with infusion for iron deficiency anemia.   01/16/23 Vanessa Lara is a 68 y.o. female here for follow up for seropositive RA.  Currently health maintenance treatment after discontinuing 5 mg prednisone is on medication for joint pain for RA along with osteoarthritis and chronic pain syndrome.  Had recent knee steroid injection since her last visit but reports overall widespread symptoms are severe.   Particularly is been noticing some increased trouble with pain and some swelling involving the wrist worse on left side.   10/03/22 Vanessa Lara is a 68 y.o. female here for follow up for seropositive RA currently off long term DMARD with prednisone 5 mg daily as needed.  She is overall not doing well feels like pain and stiffness is a big problem but more severe is her fatigue and feels a generalized getting weaker with difficulty moving and with her upper body strength.  She pretty consistently has to take the prednisone for otherwise does not move around much during the day.  Continues to get a benefit with the Flexeril at nighttime with slight decrease in frequency of awakenings.  She is also concerned about getting an update to her bone density testing ongoing oral alendronate treatment.  She is also still having a lot of pain and difficulty with walking from her claw toe deformities on the foot and planning to follow-up with surgery clinic about management of  this.   06/28/22 LAMETRIA KLUNK is a 68 y.o. female here for follow up for seropositive RA now off of treatment after stopping the Kevzara.  Since she stopped taking the medication her complaint of night sweats and fatigue have improved. She has seen a worsening of joint pain in several areas particularly around the MCP joints of both hands. For knee pain she saw orthopedic surgery clinic yesterday and had injections on both sides.  She also discussed getting set up to do right SI joint steroid injection.  Biggest complaint today is back pain on the right side around the middle of the back and around the scapula.  This bothers her especially with certain movements and trying to bend side to side.    05/18/2022  GENTRY SEEBER is a 68 y.o. female here for follow up for seropositive rheumatoid arthritis on Kevzara 150 mg [place] q14days. She feels arthritis has been fairly well controlled but has pain worse in her left foot mostly. She had GI issues  suspected UTI but with negative culture since last visit. She feels a sensation "like her brain is numb." Also has ongoing upper neck pain without radiating symptoms.    02/07/2022 IVALEE STRAUSER is a 68 y.o. female here for follow up for rheumatoid arthritis with chronic joint pain in multiple areas and chronic fatigue labs showing elevated rheumatoid factor. Kevzara new start on 12/15/2021. She felt an improvement when taking the injections within first weeks. Accidentally took the shots twice in a week due to forgetfulness or mistake but did not notice any major problems. She called in on account of increased joint pain but felt no improvement with the prescribed medrol dose pack. She did feel more lightheadedness or off balance feeling while taking this so stopped after about 3 days. Currently back pain and her left foot pain are worst issues.   Previous HPI 12/05/2021 JALASIA ESKRIDGE is a 68 y.o. female here for evaluation of rheumatoid arthritis with chronic joint pain in multiple areas and chronic fatigue labs showing elevated rheumatoid factor. She has history of mitral valve prolapse, severe osteoporosis, osteoarthritis, and severe weight loss and deconditioning. She was diagnosed with seropositive RA in 2018 with Dr. Bascom Lily and most recently saw Dr. Ebbie Goldmann in 11/2020. At that time recommended to start hydroxychloroquine for inflammatory arthritis symptoms. She previously tried methotrexate, simponi aria, and orencia with poor tolerance. Imaging consistent with degenerative arthritis in cervical spine and knee but also with some partial tendon tear in shoulder and some synovitis present in knee on MRI imaging. She has taken multiple medications for pain including NSAIDs, prednisone, gabapentin, tramadol, and hydrocodone or oxycodone. She has a lot of pain in multiple areas but is also severely fatigued. She has significant weight loss down to 70 lbs unintentionally reports chronic IBS issues but no recent  changes during this period of weight loss. Joint pain also limits her activity quite a bit with chronic deformities and also ongoing swelling in bilateral hands. She has also developed left sided headache localized above the temporal area. She has a lot of pain at the base of the skull and occiput without clear radiation of symptoms. She is scheduled to see Dr. Billy Bue for headache evaluation and management 3/28.   DMARD Hx AZA LEF HCQ SSZ MTX Simponi Orencia Kevzara   05/2017 RF 15 CCP 32 HBV neg HCV neg   Review of Systems  Constitutional:  Positive for fatigue.  HENT:  Positive for mouth dryness. Negative  for mouth sores.   Eyes:  Positive for dryness.  Respiratory:  Positive for shortness of breath.   Cardiovascular:  Positive for chest pain and palpitations.  Gastrointestinal:  Positive for constipation and diarrhea. Negative for blood in stool.  Endocrine: Negative for increased urination.  Genitourinary:  Negative for involuntary urination.  Musculoskeletal:  Positive for joint pain, gait problem, joint pain, joint swelling, myalgias, muscle weakness, morning stiffness, muscle tenderness and myalgias.  Skin:  Positive for color change. Negative for rash, hair loss and sensitivity to sunlight.  Allergic/Immunologic: Negative for susceptible to infections.  Neurological:  Positive for dizziness and headaches.  Hematological:  Negative for swollen glands.  Psychiatric/Behavioral:  Positive for depressed mood and sleep disturbance. The patient is nervous/anxious.     PMFS History:  Patient Active Problem List   Diagnosis Date Noted   High risk medication use 01/16/2023   Medication monitoring encounter 12/05/2021   Physical deconditioning 11/11/2021   Elevated serum lactate dehydrogenase (LDH) 08/17/2021   Vitamin D deficiency 08/17/2021   Chronic fatigue 07/25/2021   Vitamin B12 deficiency 07/19/2021   Hardening of the aorta (main artery of the heart) (HCC) 06/21/2021    Solitary lung nodule 06/15/2021   Loss of weight 05/18/2021   Protein-calorie malnutrition, severe (HCC) 05/18/2021   Osteoporosis 06/15/2020   Iron deficiency anemia 04/09/2020   Primary osteoarthritis involving multiple joints 12/31/2018   Rheumatoid arthritis involving multiple sites with positive rheumatoid factor (HCC) 12/31/2018   Irritable bowel syndrome with both constipation and diarrhea 10/28/2018   Constipation 03/14/2016   Mitral valve prolapse 11/23/2015   High cholesterol 11/23/2015    Past Medical History:  Diagnosis Date   Anemia    Anxiety    Arthritis    RA   Bursitis    Cataract    CHF (congestive heart failure) (HCC)    Collagen vascular disease (HCC)    Depression    Endometriosis    Heart murmur    High cholesterol    Hypertension    IBS (irritable bowel syndrome)    Lymphadenopathy    Mitral valve prolapse    Osteoporosis    RA (rheumatoid arthritis) (HCC)    Rotator cuff tear    bilateral   Stroke (HCC) 06/2022   Uterus, adenomyosis     Family History  Problem Relation Age of Onset   Bone cancer Mother    Brain cancer Father    Brain cancer Sister    Breast cancer Sister    Renal Disease Sister    Pancreatic cancer Neg Hx    Esophageal cancer Neg Hx    Stomach cancer Neg Hx    Rectal cancer Neg Hx    Colon polyps Neg Hx    Past Surgical History:  Procedure Laterality Date   AXILLARY LYMPH NODE BIOPSY Right 10/12/2020   Procedure: RIGHT AXILLARY LYMPH NODE BIOPSY EXCISION;  Surgeon: Lockie Rima, MD;  Location: [facility];  Service: General;  Laterality: Right;   BUNIONECTOMY Right    CARPAL TUNNEL RELEASE Right    COLONOSCOPY     COLONOSCOPY N/A 12/13/2015   Procedure: COLONOSCOPY;  Surgeon: Ruby Corporal, MD;  Location: [facility];  Service: Endoscopy;  Laterality: N/A;  9:55   Fatty tumor     2011 (left arm)   TONSILLECTOMY     TOTAL ABDOMINAL HYSTERECTOMY     2003   Social History   Social History  Narrative   Not on file  Immunization History  Administered Date(s) Administered   Influenza, Quadrivalent, Recombinant, Inj, Pf 07/11/2017     Objective: Vital Signs: BP 108/68 (BP Location: Left Arm, Patient Position: Sitting, Cuff Size: Small)   Pulse (!) 109   Resp 16   Ht 5' (1.524 m)   Wt 65 lb (29.5 kg)   BMI 12.69 kg/m    Physical Exam Constitutional:      Comments: Underweight  Cardiovascular:     Rate and Rhythm: Normal rate and regular rhythm.  Pulmonary:     Effort: Pulmonary effort is normal.     Breath sounds: Normal breath sounds.  Skin:    General: Skin is warm and dry.  Neurological:     Mental Status: She is alert.  Psychiatric:        Mood and Affect: Mood normal.      Musculoskeletal Exam:  Elbows full ROM no tenderness or swelling Wrists full ROM no tenderness or swelling Fingers chronic MCP joint thickening, right hand with reducible subluxation and lateral deviation, left hand worse incompletely reducible lateral deviation Knees full ROM patellofemoral crepitus, no palpable effusion Right ankle tenderness to pressure  Investigation: No additional findings.  Imaging: XR Cervical Spine 2 or 3 views Result Date: 12/25/2023 2 views of the cervical spine including AP and lateral film were ordered and reviewed by myself.  X-rays demonstrate mild to moderate arthritic change as well as cervical DDD most notable at the C4-C6 region.  There is some anterior osteophyte complex and spurring noted.  Very mild C5 on C6 retrolisthesis which is equivocal to her MRI of the cervical spine in 2024.   Recent Labs: Lab Results  Component Value Date   WBC 13.4 (H) 01/07/2024   HGB 10.6 (L) 01/07/2024   PLT 407 (H) 01/07/2024   NA 135 12/24/2023   K 3.8 12/24/2023   CL 99 12/24/2023   CO2 24 12/24/2023   GLUCOSE 102 (H) 12/24/2023   BUN 10 12/24/2023   CREATININE 0.60 12/24/2023   BILITOT 0.2 12/24/2023   ALKPHOS 112 12/24/2023   AST 16 12/24/2023    ALT 8 12/24/2023   PROT 7.2 12/24/2023   ALBUMIN 3.2 (L) 12/24/2023   CALCIUM 8.9 12/24/2023   GFRAA 92 11/06/2019   QFTBGOLDPLUS NEGATIVE 12/05/2021    Speciality Comments: No specialty comments available.  Procedures:  No procedures performed Allergies: Abatacept, Codeine, Golimumab, Hydrocodone, Methylprednisolone, Methotrexate derivatives, and Prednisone   Assessment / Plan:     Visit Diagnoses: Rheumatoid arthritis involving multiple sites with positive rheumatoid factor (HCC) - Plan: Sedimentation rate, CK, CBC with Differential/Platelet, hydroxychloroquine (PLAQUENIL) 200 MG tablet, Ambulatory referral to Physical Medicine Rehab Managed with hydroxychloroquine. Previous azathioprine discontinued due to gastrointestinal side effects. Severe pain and inflammation persist. Interested in [place] for wasting syndrome; further evaluation needed. - Continue hydroxychloroquine. - Patient inquiry regarding Chaya Cord for potential use in rheumatoid arthritis and wasting syndrome, will review. - Consider referral to a pain management specialist.  Physical deconditioning - Plan: tiZANidine (ZANAFLEX) 4 MG tablet Muscle Spasm Severe back pain, ineffective Flexeril. Tizanidine considered as alternative with sedation as main side effect. - Prescribe tizanidine for muscle spasm management. - Advise use of tizanidine before bedtime or when planning to rest due to sedative effects.  Protein-calorie malnutrition, severe (HCC)  Primary osteoarthritis involving multiple joints - Plan: tiZANidine (ZANAFLEX) 4 MG tablet, Ambulatory referral to Physical Medicine Rehab  Iron deficiency anemia, unspecified iron deficiency anemia type - Plan: Iron, TIBC and Ferritin Panel Anemia Symptoms  suggest anemia with potential hemoglobin drop. - Order lab tests to evaluate hemoglobin levels. - Check inflammatory markers to monitor rheumatoid arthritis activity.  Age-related osteoporosis without current  pathological fracture - Plan: VITAMIN D 25 Hydroxy (Vit-D Deficiency, Fractures) Vitamin D deficiency - Plan: VITAMIN D 25 Hydroxy (Vit-D Deficiency, Fractures)     Orders: Orders Placed This Encounter  Procedures   Sedimentation rate   CK   CBC with Differential/Platelet   Iron, TIBC and Ferritin Panel   VITAMIN D 25 Hydroxy (Vit-D Deficiency, Fractures)   Ambulatory referral to Physical Medicine Rehab   Meds ordered this encounter  Medications   hydroxychloroquine (PLAQUENIL) 200 MG tablet    Sig: Take daily 5 days out of the week    Dispense:  25 tablet    Refill:  2   tiZANidine (ZANAFLEX) 4 MG tablet    Sig: Take 1 tablet (4 mg total) by mouth every 8 (eight) hours as needed for muscle spasms.    Dispense:  30 tablet    Refill:  1     Follow-Up Instructions: Return in about 3 months (around 04/07/2024) for Ra on HCQ f/u 3mos.   Matt Song, MD  Note - This record has been created using [employer].  Chart creation errors have been sought, but may not always  have been located. Such creation errors do not reflect on  the standard of medical care.

## 2024-01-08 ENCOUNTER — Inpatient Hospital Stay

## 2024-01-08 LAB — CBC WITH DIFFERENTIAL/PLATELET
Absolute Lymphocytes: 1822 {cells}/uL (ref 850–3900)
Absolute Monocytes: 764 {cells}/uL (ref 200–950)
Basophils Absolute: 67 {cells}/uL (ref 0–200)
Basophils Relative: 0.5 %
Eosinophils Absolute: 54 {cells}/uL (ref 15–500)
Eosinophils Relative: 0.4 %
HCT: 32.2 % — ABNORMAL LOW (ref 35.0–45.0)
Hemoglobin: 10.6 g/dL — ABNORMAL LOW (ref 11.7–15.5)
MCH: 28.9 pg (ref 27.0–33.0)
MCHC: 32.9 g/dL (ref 32.0–36.0)
MCV: 87.7 fL (ref 80.0–100.0)
MPV: 9.5 fL (ref 7.5–12.5)
Monocytes Relative: 5.7 %
Neutro Abs: 10693 {cells}/uL — ABNORMAL HIGH (ref 1500–7800)
Neutrophils Relative %: 79.8 %
Platelets: 407 10*3/uL — ABNORMAL HIGH (ref 140–400)
RBC: 3.67 10*6/uL — ABNORMAL LOW (ref 3.80–5.10)
RDW: 15.5 % — ABNORMAL HIGH (ref 11.0–15.0)
Total Lymphocyte: 13.6 %
WBC: 13.4 10*3/uL — ABNORMAL HIGH (ref 3.8–10.8)

## 2024-01-08 LAB — VITAMIN D 25 HYDROXY (VIT D DEFICIENCY, FRACTURES): Vit D, 25-Hydroxy: 75 ng/mL (ref 30–100)

## 2024-01-08 LAB — IRON,TIBC AND FERRITIN PANEL
%SAT: 11 % — ABNORMAL LOW (ref 16–45)
Ferritin: 202 ng/mL (ref 16–288)
Iron: 25 ug/dL — ABNORMAL LOW (ref 45–160)
TIBC: 222 ug/dL — ABNORMAL LOW (ref 250–450)

## 2024-01-08 LAB — CK: Total CK: 36 U/L (ref 20–243)

## 2024-01-08 LAB — SEDIMENTATION RATE: Sed Rate: 58 mm/h — ABNORMAL HIGH (ref 0–30)

## 2024-01-10 ENCOUNTER — Encounter

## 2024-01-14 ENCOUNTER — Encounter: Admitting: *Deleted

## 2024-01-14 VITALS — BP 98/70 | HR 69 | Temp 97.6°F | Resp 16

## 2024-01-14 DIAGNOSIS — D509 Iron deficiency anemia, unspecified: Secondary | ICD-10-CM

## 2024-01-14 DIAGNOSIS — D500 Iron deficiency anemia secondary to blood loss (chronic): Secondary | ICD-10-CM

## 2024-01-14 MED ORDER — SODIUM CHLORIDE 0.9 % IV SOLN: Freq: Once | INTRAVENOUS | Status: AC

## 2024-01-14 MED ORDER — IRON SUCROSE 20 MG/ML IV SOLN
200.0000 mg | Freq: Once | INTRAVENOUS | Status: AC
  Administered 2024-01-14: 200 mg via INTRAVENOUS

## 2024-01-14 NOTE — Progress Notes (Signed)
 Diagnosis: Acute Anemia  Provider:  Lorre Rosin NP  Procedure: IV Push  IV Type: Peripheral, IV Location: L Antecubital  Venofer (Iron Sucrose), Dose: 200 mg  Post Infusion IV Care: Observation period completed  Discharge: Condition: Good, Destination: Home . AVS Declined  Performed by:  Verneda Golder, RN

## 2024-01-15 ENCOUNTER — Telehealth: Payer: Self-pay | Admitting: *Deleted

## 2024-01-15 NOTE — Telephone Encounter (Signed)
 Patient contacted the office and left message stating she would like to know if you have given it some thought as to the new medication she may be able to take for her inflammation. Patient states she is very uncomfortable today. Patient states she wanted you to be aware that the muscle relaxer has helped.

## 2024-01-15 NOTE — Telephone Encounter (Signed)
 Patient called to advise that she no longer wishes to receive iron infusions, as they make her feel very unwell and she does not feel it is benefiting her.  Sheril Dines, PAC made aware and telephone visit scheduled for her to discuss with her.

## 2024-01-17 NOTE — Telephone Encounter (Signed)
 Patient contacted the office regarding the medication she was interested in and wanted to know if Dr. Rodell Citrin has made a decision about the medication and whether it would be good to start or not. Patient states she believes the medication is called [place]. Patient would like a cal back. Please advise.

## 2024-01-18 NOTE — Progress Notes (Signed)
 Sed rate remained moderately increased at 58 about the same as before. She continues to have iron  deficiency anemia. I reviewed the medication she asked about Chaya Cord. I have no firsthand experience using this drug but from what I see she does not have any indication to be taking it. If the tizanidine  is helping any not causing side effects she can continue with that as needed.

## 2024-01-18 NOTE — Telephone Encounter (Signed)
 Now addressed in associated result note

## 2024-01-21 ENCOUNTER — Other Ambulatory Visit: Payer: Self-pay | Admitting: *Deleted

## 2024-01-21 ENCOUNTER — Inpatient Hospital Stay

## 2024-01-21 DIAGNOSIS — D649 Anemia, unspecified: Secondary | ICD-10-CM

## 2024-01-21 NOTE — Telephone Encounter (Signed)
 Addressed in result note.

## 2024-01-22 ENCOUNTER — Inpatient Hospital Stay: Attending: Hematology

## 2024-01-22 DIAGNOSIS — D649 Anemia, unspecified: Secondary | ICD-10-CM | POA: Diagnosis present

## 2024-01-22 LAB — SAMPLE TO BLOOD BANK

## 2024-01-22 LAB — CBC
HCT: 34.7 % — ABNORMAL LOW (ref 36.0–46.0)
Hemoglobin: 11.1 g/dL — ABNORMAL LOW (ref 12.0–15.0)
MCH: 28.4 pg (ref 26.0–34.0)
MCHC: 32 g/dL (ref 30.0–36.0)
MCV: 88.7 fL (ref 80.0–100.0)
Platelets: 395 10*3/uL (ref 150–400)
RBC: 3.91 MIL/uL (ref 3.87–5.11)
RDW: 16.1 % — ABNORMAL HIGH (ref 11.5–15.5)
WBC: 9 10*3/uL (ref 4.0–10.5)
nRBC: 0 % (ref 0.0–0.2)

## 2024-01-24 ENCOUNTER — Ambulatory Visit

## 2024-01-25 ENCOUNTER — Other Ambulatory Visit: Payer: Self-pay | Admitting: Internal Medicine

## 2024-01-25 ENCOUNTER — Ambulatory Visit

## 2024-01-25 DIAGNOSIS — R5381 Other malaise: Secondary | ICD-10-CM

## 2024-01-25 DIAGNOSIS — M150 Primary generalized (osteo)arthritis: Secondary | ICD-10-CM

## 2024-01-25 NOTE — Telephone Encounter (Signed)
 Last Fill: 01/07/2024  Next Visit: 04/17/2024  Last Visit: 01/07/2024  Dx:  Rheumatoid arthritis involving multiple sites with positive rheumatoid factor (HCC)   Current Dose per office note on 01/07/2024: dose not mentioned, Advise use of tizanidine  before bedtime or when planning to rest due to sedative effects.   Directions in prescription state every 8 hours, unsure if patient has used all of her current prescription if taking every 8 hours.   Okay to refill Flexeril ?

## 2024-01-26 NOTE — Progress Notes (Unsigned)
 VIRTUAL VISIT via TELEPHONE NOTE [facility]   I connected with Vanessa Lara  on 01/28/2024 at 2:50 PM by telephone and verified that I am speaking with the correct person using two identifiers.  Location: Patient: Home Provider: [facility]   I discussed the limitations, risks, security and privacy concerns of performing an evaluation and management service by telephone and the availability of in person appointments. I also discussed with the patient that there may be a patient responsible charge related to this service. The patient expressed understanding and agreed to proceed.  INTERVAL HISTORY:  Vanessa Lara is contacted today per her request.  Patient follows with clinic for anemia of chronic disease with iron  deficiency.  She contacted nurse triage on 01/15/2024 to state that she no longer wishes to receive iron  infusions, as they "make her feel very unwell and she does not feel it is benefiting her."  She requested telephone visit with provider to discuss this further.    At last visit, she was recommended to receive Venofer  200 mg every other week x 3 doses due to functional iron  deficiency.  She received 2 of her 3 prescribed doses [facility] (12/27/2023 and 01/14/2024).    At today's visit, patient reports feeling very poorly due to her multiple chronic comorbidities, particularly her rheumatoid arthritis and IBS.  She reports that her IBS seems to flareup following her IV iron  infusions, with worsening diarrhea and constipation that alternate for 5 to 7 days following each iron  infusion.  She would like to hold off on any additional IV iron  infusions for now, but is open to discussing this again at her next appointment.  ASSESSMENT & PLAN:  1.   Normocytic anemia - (anemia of chronic disease +/- iron  deficiency) - At last visit, she was recommended to receive Venofer  200 mg every other week x 3 doses due to functional iron  deficiency.  She  received 2 of her 3 prescribed doses at the [facility] (12/27/2023 and 01/14/2024).    - IBS seems to flare up following her IV iron  infusions, with worsening diarrhea and constipation that alternate for 5 to 7 days following each iron  infusion.  She would like to hold off on any additional IV iron  infusions for now, but is open to discussing this again at her next appointment. - DIFFERENTIAL DIAGNOSIS favors anemia of chronic disease with functional iron  deficiency, occult GI blood loss, and likely malabsorption secondary to chronic inflammation.  She may also have some drug-induced myelosuppression related to Plaquenil  (note that Plaquenil  effects on bone marrow are not always directly observed on bone marrow biopsy). - PLAN: Discussed with patient that we will hold off on her third dose of IV iron  for the time being.  We will recheck labs as scheduled in June 2025 and discuss additional IV iron  if indicated at that time.  Would potentially consider a different brand of iron  to see if this is better tolerated.  Recommend that she also follow-up with her GI provider for her IBS symptoms.   2.  Other issues - Patient expressed some emotional distress and difficulty coping with chronic illness and chronic pain. - She is established with palliative care services. - We discussed that she may benefit from adding a licensed counselor/therapist to her care team, but she feels overwhelmed with the idea of "having another place to go to."  She was very excited to learn that online therapy may be an  option. - PLAN: Patient provided with resources for coping with chronic illness, including information regarding various online counseling/therapy services. - Patient requests call from Child psychotherapist (as per visit on 01/28/2024) to discuss "placement options since it looks like she will not be able to live on her own much longer"  OTHER ISSUES (not addressed at today's visit) - see full note dated 12/24/2023 for  details Leukocytosis & thrombocytosis, reactive Adult failure to thrive PET positive right axillary lymph node (benign per biopsy) PET positive ileocecal lesion (normal per colonoscopy, resolved on follow-up imaging) Osteoporosis Rheumatoid arthritis  PLAN SUMMARY:  >> Continue with labs and follow-up as scheduled >> Referral to social work entered per patient request     REVIEW OF SYSTEMS:   Review of Systems  Constitutional:  Positive for malaise/fatigue. Negative for chills, diaphoresis, fever and weight loss.  Respiratory:  Positive for shortness of breath. Negative for cough.   Cardiovascular:  Negative for chest pain and palpitations.  Gastrointestinal:  Positive for constipation, diarrhea and nausea. Negative for abdominal pain, blood in stool, melena and vomiting.  Skin:  Positive for itching.  Neurological:  Positive for dizziness, tingling and headaches.  Psychiatric/Behavioral:  Positive for depression. The patient is nervous/anxious.     PHYSICAL EXAM: (per limitations of virtual telephone visit)  The patient is alert and oriented x 3, exhibiting adequate mentation, good mood, and ability to speak in full sentences and execute sound judgement.  WRAP UP:   I discussed the assessment and treatment plan with the patient. The patient was provided an opportunity to ask questions and all were answered. The patient agreed with the plan and demonstrated an understanding of the instructions.   The patient was advised to call back or seek an in-person evaluation if the symptoms worsen or if the condition fails to improve as anticipated.  I provided 23 minutes of non-face-to-face time during this encounter, including >10 minutes of medical discussion.  Sonnie Dusky, PA-C 01/28/24 3:20 PM

## 2024-01-28 ENCOUNTER — Encounter: Payer: Self-pay | Admitting: Physician Assistant

## 2024-01-28 ENCOUNTER — Ambulatory Visit: Admitting: Physician Assistant

## 2024-01-28 ENCOUNTER — Inpatient Hospital Stay: Admitting: Physician Assistant

## 2024-01-28 VITALS — Wt <= 1120 oz

## 2024-01-28 DIAGNOSIS — M0579 Rheumatoid arthritis with rheumatoid factor of multiple sites without organ or systems involvement: Secondary | ICD-10-CM

## 2024-01-28 DIAGNOSIS — D500 Iron deficiency anemia secondary to blood loss (chronic): Secondary | ICD-10-CM | POA: Diagnosis not present

## 2024-01-28 DIAGNOSIS — E43 Unspecified severe protein-calorie malnutrition: Secondary | ICD-10-CM

## 2024-01-28 DIAGNOSIS — R5381 Other malaise: Secondary | ICD-10-CM

## 2024-01-28 DIAGNOSIS — D649 Anemia, unspecified: Secondary | ICD-10-CM

## 2024-01-30 ENCOUNTER — Inpatient Hospital Stay: Admitting: Licensed Clinical Social Worker

## 2024-01-30 ENCOUNTER — Ambulatory Visit: Admitting: Internal Medicine

## 2024-01-30 ENCOUNTER — Telehealth: Payer: Self-pay | Admitting: Licensed Clinical Social Worker

## 2024-01-30 NOTE — Telephone Encounter (Signed)
 attempted to reach pt for scheduled phone visit/ no answer and voicemail is full

## 2024-01-30 NOTE — Progress Notes (Deleted)
 Vanessa Lara, female    DOB: Oct 02, 1956    MRN: [id number]   Brief patient profile:  68  yo***  *** referred to pulmonary clinic in [place]  01/30/2024 by *** for ***     History of Present Illness  01/30/2024  Pulmonary/ 1st office eval/ [facility] / [place] [facility]  No chief complaint on file.    Dyspnea:  *** Cough: *** Sleep: *** SABA use: *** 02: *** LDSCT:***  No obvious day to day or daytime pattern/variability or assoc excess/ purulent sputum or mucus plugs or hemoptysis or cp or chest tightness, subjective wheeze or overt sinus or hb symptoms.    Also denies any obvious fluctuation of symptoms with weather or environmental changes or other aggravating or alleviating factors except as outlined above   No unusual exposure hx or h/o childhood pna/ asthma or knowledge of premature birth.  Current Allergies, Complete Past Medical History, Past Surgical History, Family History, and Social History were reviewed in [name] record.  ROS  The following are not active complaints unless bolded Hoarseness, sore throat, dysphagia, dental problems, itching, sneezing,  nasal congestion or discharge of excess mucus or purulent secretions, ear ache,   fever, chills, sweats, unintended wt loss or wt gain, classically pleuritic or exertional cp,  orthopnea pnd or arm/hand swelling  or leg swelling, presyncope, palpitations, abdominal pain, anorexia, nausea, vomiting, diarrhea  or change in bowel habits or change in bladder habits, change in stools or change in urine, dysuria, hematuria,  rash, arthralgias, visual complaints, headache, numbness, weakness or ataxia or problems with walking or coordination,  change in mood or  memory.            Outpatient Medications Prior to Visit  Medication Sig Dispense Refill   acetaminophen  (TYLENOL ) 500 MG tablet Take 500 mg by mouth every 6 (six) hours as needed.     Ascorbic Acid (VITAMIN C PO) Take by mouth.     ASPIRIN  81 PO Take 1 tablet by mouth daily.     atorvastatin  (LIPITOR) 10 MG tablet Take by mouth.     Cholecalciferol (VITAMIN D3) 25 MCG (1000 UT) CAPS 1 capsule Orally Once a day for 30 day(s)     diclofenac  Sodium (VOLTAREN ) 1 % GEL SMARTSIG:Gram(s) Topical 4 Times Daily PRN     docusate sodium  (COLACE) 100 MG capsule TAKE 1 CAPSULE(100 MG) BY MOUTH TWICE DAILY 60 capsule 2   fentaNYL  (DURAGESIC ) 50 MCG/HR Place onto the skin every 3 (three) days. Patient states she does it every other day.     folic acid  (FOLVITE ) 1 MG tablet Take 1 mg by mouth daily.     furosemide  (LASIX ) 20 MG tablet Take 20 mg by mouth daily as needed.     Heat Wraps (THERMACARE BACK/HIP) MISC 1 application by Does not apply route daily. 3 each 11   hydroxychloroquine  (PLAQUENIL ) 200 MG tablet Take daily 5 days out of the week 25 tablet 2   hydrOXYzine (ATARAX) 25 MG tablet Take 25 mg by mouth 2 (two) times daily.     lidocaine  (LIDODERM ) 5 % one patch daily.     MYRBETRIQ 50 MG TB24 tablet Take 50 mg by mouth daily.     naloxone (NARCAN) nasal spray 4 mg/0.1 mL      ondansetron  (ZOFRAN -ODT) 4 MG disintegrating tablet Take 4 mg by mouth every 8 (eight) hours as needed.     pantoprazole  (PROTONIX ) 40 MG tablet TAKE 1 TABLET(40 MG)  BY MOUTH TWICE DAILY 180 tablet 3   PARoxetine  (PAXIL ) 20 MG tablet Take 20 mg by mouth daily.     sucralfate (CARAFATE) 1 GM/10ML suspension SHAKE LIQUID AND TAKE 10 ML(1 GRAM) BY MOUTH FOUR TIMES DAILY     tiZANidine  (ZANAFLEX ) 4 MG tablet TAKE 1 TABLET(4 MG) BY MOUTH EVERY 8 HOURS AS NEEDED FOR MUSCLE SPASMS 30 tablet 1   traZODone  (DESYREL ) 100 MG tablet Take 1 tablet (100 mg total) by mouth at bedtime as needed for sleep. 30 tablet 6   Zoledronic  Acid (RECLAST  IV) Inject 5 mg into the vein. Once yearly     No facility-administered medications prior to visit.    Past Medical History:  Diagnosis Date   Anemia    Anxiety    Arthritis    RA   Bursitis    Cataract    CHF (congestive heart  failure) (HCC)    Collagen vascular disease (HCC)    Depression    Endometriosis    Heart murmur    High cholesterol    Hypertension    IBS (irritable bowel syndrome)    Lymphadenopathy    Mitral valve prolapse    Osteoporosis    RA (rheumatoid arthritis) (HCC)    Rotator cuff tear    bilateral   Stroke (HCC) 06/2022   Uterus, adenomyosis       Objective:     There were no vitals taken for this visit.         Assessment   No problem-specific Assessment & Plan notes found for this encounter.     Vernestine Gondola, MD 01/30/2024

## 2024-02-01 ENCOUNTER — Ambulatory Visit: Admitting: Surgical

## 2024-02-04 ENCOUNTER — Inpatient Hospital Stay

## 2024-02-08 ENCOUNTER — Telehealth: Payer: Self-pay | Admitting: *Deleted

## 2024-02-08 NOTE — Telephone Encounter (Signed)
 Patient contacted the office stating she is on PLQ. Patient would like to know if there is anything stronger she can take for her RA. Patient states every joint in her body is hurting.

## 2024-02-11 ENCOUNTER — Telehealth: Payer: Self-pay | Admitting: Sports Medicine

## 2024-02-11 NOTE — Telephone Encounter (Signed)
 Patient called and said she was supposed to get an referral about a nerve study. ZO#[phone]

## 2024-02-11 NOTE — Telephone Encounter (Signed)
 I do not think we have talked about doing a nerve study.  What is this in regards to?

## 2024-02-13 ENCOUNTER — Inpatient Hospital Stay: Attending: Hematology

## 2024-02-13 NOTE — Telephone Encounter (Signed)
I called patient, no answer.  Will try again.

## 2024-02-14 ENCOUNTER — Encounter: Payer: Self-pay | Admitting: Physician Assistant

## 2024-02-14 ENCOUNTER — Other Ambulatory Visit (INDEPENDENT_AMBULATORY_CARE_PROVIDER_SITE_OTHER): Payer: Self-pay

## 2024-02-14 ENCOUNTER — Other Ambulatory Visit: Payer: Self-pay

## 2024-02-14 ENCOUNTER — Ambulatory Visit (INDEPENDENT_AMBULATORY_CARE_PROVIDER_SITE_OTHER): Admitting: Physician Assistant

## 2024-02-14 VITALS — Ht 60.63 in | Wt <= 1120 oz

## 2024-02-14 DIAGNOSIS — M19031 Primary osteoarthritis, right wrist: Secondary | ICD-10-CM

## 2024-02-14 DIAGNOSIS — M25532 Pain in left wrist: Secondary | ICD-10-CM

## 2024-02-14 DIAGNOSIS — M25531 Pain in right wrist: Secondary | ICD-10-CM | POA: Diagnosis not present

## 2024-02-14 MED ORDER — LIDOCAINE HCL 1 % IJ SOLN
3.0000 mL | INTRAMUSCULAR | Status: AC | PRN
  Administered 2024-02-14: 3 mL

## 2024-02-14 MED ORDER — METHYLPREDNISOLONE ACETATE 40 MG/ML IJ SUSP
40.0000 mg | INTRAMUSCULAR | Status: AC | PRN
  Administered 2024-02-14: 40 mg via INTRA_ARTICULAR

## 2024-02-14 NOTE — Telephone Encounter (Signed)
 Patient came in for appointment with Dwyane Glad today and saw [facility] as well.  Not aware of nerve study being ordered.

## 2024-02-14 NOTE — Progress Notes (Signed)
 Office Visit Note   Patient: Vanessa Lara           Date of Birth: June 07, 1956           MRN: [id number] Visit Date: 02/14/2024              Requested by: Lorre Rosin, NP [address] [place],  [place] [address] PCP: Lorre Rosin, NP   Assessment & Plan: Visit Diagnoses:  1. Pain in right wrist   2. Pain in left wrist     Plan: Will try a removable thumb spica splint for right hand to see if this helps at all given the significant pain she has in her wrist and hand.  Underwent right wrist radiocarpal ultrasound-guided injection by Dr. Rozelle Corning.  Follow-Up Instructions: Return if symptoms worsen or fail to improve.   Orders:  Orders Placed This Encounter  Procedures   Medium Joint Inj   XR Wrist Complete Right   XR Wrist Complete Left   US  Guided Needle Placement - No Linked Charges   No orders of the defined types were placed in this encounter.     Procedures: Medium Joint Inj on 02/14/2024 5:52 PM Details: 3.5 in ultrasound-guided dorsal approach Medications: 3 mL lidocaine  1 %; 40 mg methylPREDNISolone  acetate 40 MG/ML Consent was given by the patient. Immediately prior to procedure a time out was called to verify the correct patient, procedure, equipment, support staff and site/side marked as required. Patient was prepped and draped in the usual sterile fashion.       Clinical Data: No additional findings.   Subjective: Chief Complaint  Patient presents with   Right Wrist - Pain   Left Wrist - Pain    HPI Patient 68 year old female comes in today complaining of bilateral wrist pain right greater than left.  She has known rheumatoid arthritis.  She denies any falls or injuries.  She denies any numbness tingling of either hand.  Review of Systems Negative for fevers or ongoing infection.  Objective: Vital Signs: Ht 5' 0.63" (1.54 m)   Wt 65 lb 6.4 oz (29.7 kg)   BMI 12.51 kg/m   Physical Exam Constitutional:      Appearance: She  is underweight.  Cardiovascular:     Pulses: Normal pulses.  Neurological:     Mental Status: She is alert.  Psychiatric:        Mood and Affect: Mood normal.     Ortho Exam Bilateral hands: Arthritic changes throughout both hands.  Ulnar deviation through the metacarpal phalangeal joints bilaterally.  Thenar atrophy bilaterally.  No impending ulcers or rashes on pain.  She has maximal tenderness right wrist at the radiocarpal joint.  Also tenderness over the right thumb MP joint.  Radial pulses present bilaterally.  Fingers are well-perfused.   Specialty Comments:    Imaging: XR Wrist Complete Left Result Date: 02/14/2024 Left wrist CMC joint arthritis ulnar deviation through the metacarpal phalangeal joints.  No acute fractures or acute findings.  XR Wrist Complete Right Result Date: 02/14/2024 Right wrist: No acute fracture.  Erosive changes of the distal radius.  Severe radiocarpal arthritic changes.  Diffuse metacarpal phalangeal joint arthritic changes.    Ulnar deviation first through the fifth metacarpal phalangeal joints.  No acute findings otherwise.    PMFS History: Patient Active Problem List   Diagnosis Date Noted   Chronic gastritis without bleeding 08/01/2023   Generalized anxiety disorder 08/01/2023   Redundant colon 08/01/2023  Mixed hyperlipidemia 08/01/2023   High risk medication use 01/16/2023   History of CVA (cerebrovascular accident) 11/14/2022   Insomnia due to medical condition 03/15/2022   Degeneration of intervertebral disc of cervical region 12/28/2021   Medication monitoring encounter 12/05/2021   Physical deconditioning 11/11/2021   Elevated serum lactate dehydrogenase (LDH) 08/17/2021   Vitamin D  deficiency 08/17/2021   Chronic fatigue 07/25/2021   Vitamin B12 deficiency 07/19/2021   Hardening of the aorta (main artery of the heart) (HCC) 06/21/2021   Solitary lung nodule 06/15/2021   Loss of weight 05/18/2021   Protein-calorie  malnutrition, severe (HCC) 05/18/2021   Osteoporosis 06/15/2020   Iron  deficiency anemia 04/09/2020   Primary osteoarthritis involving multiple joints 12/31/2018   Rheumatoid arthritis involving multiple sites with positive rheumatoid factor (HCC) 12/31/2018   Irritable bowel syndrome with both constipation and diarrhea 10/28/2018   Constipation 03/14/2016   Mitral valve prolapse 11/23/2015   High cholesterol 11/23/2015   Past Medical History:  Diagnosis Date   Anemia    Anxiety    Arthritis    RA   Bursitis    Cataract    CHF (congestive heart failure) (HCC)    Collagen vascular disease (HCC)    Depression    Endometriosis    Heart murmur    High cholesterol    Hypertension    IBS (irritable bowel syndrome)    Lymphadenopathy    Mitral valve prolapse    Osteoporosis    RA (rheumatoid arthritis) (HCC)    Rotator cuff tear    bilateral   Stroke (HCC) 06/2022   Uterus, adenomyosis     Family History  Problem Relation Age of Onset   Bone cancer Mother    Brain cancer Father    Brain cancer Sister    Breast cancer Sister    Renal Disease Sister    Pancreatic cancer Neg Hx    Esophageal cancer Neg Hx    Stomach cancer Neg Hx    Rectal cancer Neg Hx    Colon polyps Neg Hx     Past Surgical History:  Procedure Laterality Date   AXILLARY LYMPH NODE BIOPSY Right 10/12/2020   Procedure: RIGHT AXILLARY LYMPH NODE BIOPSY EXCISION;  Surgeon: Lockie Rima, MD;  Location: [facility];  Service: General;  Laterality: Right;   BUNIONECTOMY Right    CARPAL TUNNEL RELEASE Right    COLONOSCOPY     COLONOSCOPY N/A 12/13/2015   Procedure: COLONOSCOPY;  Surgeon: Ruby Corporal, MD;  Location: [facility];  Service: Endoscopy;  Laterality: N/A;  9:55   Fatty tumor     2011 (left arm)   TONSILLECTOMY     TOTAL ABDOMINAL HYSTERECTOMY     2003   Social History   Occupational History   Occupation: retired  Tobacco Use   Smoking status: Some Days    Current  packs/day: 0.00    Average packs/day: 0.5 packs/day for 33.0 years (16.5 ttl pk-yrs)    Types: Cigarettes    Start date: 28    Last attempt to quit: 2013    Years since quitting: 12.3   Smokeless tobacco: Never  Vaping Use   Vaping status: Never Used  Substance and Sexual Activity   Alcohol use: No    Alcohol/week: 0.0 standard drinks of alcohol   Drug use: No   Sexual activity: Not Currently

## 2024-02-15 MED ORDER — BUPIVACAINE HCL 0.25 % IJ SOLN
1.0000 mL | INTRAMUSCULAR | Status: AC | PRN
Start: 2024-02-14 — End: 2024-02-14
  Administered 2024-02-14: 1 mL via INTRA_ARTICULAR

## 2024-02-15 MED ORDER — TRIAMCINOLONE ACETONIDE 40 MG/ML IJ SUSP
20.0000 mg | INTRAMUSCULAR | Status: AC | PRN
  Administered 2024-02-14: 20 mg via INTRA_ARTICULAR

## 2024-02-15 NOTE — Progress Notes (Signed)
   Procedure Note  Patient: Vanessa Lara             Date of Birth: [birth year]           MRN: [id number]             Visit Date: 02/14/2024  Procedures: Visit Diagnoses:  1. Pain in right wrist   2. Pain in left wrist     Medium Joint Inj: R radiocarpal on 02/14/2024 11:56 AM Indications: pain and diagnostic evaluation Details: 22 G 1.5 in needle, ultrasound-guided dorsal approach Medications: 1 mL bupivacaine  0.25 %; 20 mg triamcinolone  acetonide 40 MG/ML Outcome: tolerated well, no immediate complications Procedure, treatment alternatives, risks and benefits explained, specific risks discussed. Consent was given by the patient. Immediately prior to procedure a time out was called to verify the correct patient, procedure, equipment, support staff and site/side marked as required. Patient was prepped and draped in the usual sterile fashion.

## 2024-02-18 ENCOUNTER — Inpatient Hospital Stay

## 2024-02-18 ENCOUNTER — Ambulatory Visit: Admitting: Orthopedic Surgery

## 2024-02-20 ENCOUNTER — Telehealth: Payer: Self-pay | Admitting: *Deleted

## 2024-02-20 ENCOUNTER — Encounter: Payer: Self-pay | Admitting: *Deleted

## 2024-02-20 NOTE — Telephone Encounter (Signed)
 Patient called to advise that she has just not had enough energy to make her lab appointments.  Made aware to call when she does feel well enough and we can put her on the schedule.

## 2024-02-29 ENCOUNTER — Other Ambulatory Visit: Payer: Self-pay

## 2024-02-29 ENCOUNTER — Other Ambulatory Visit (INDEPENDENT_AMBULATORY_CARE_PROVIDER_SITE_OTHER): Payer: Self-pay

## 2024-02-29 ENCOUNTER — Ambulatory Visit (INDEPENDENT_AMBULATORY_CARE_PROVIDER_SITE_OTHER): Admitting: Surgical

## 2024-02-29 DIAGNOSIS — M25562 Pain in left knee: Secondary | ICD-10-CM

## 2024-02-29 DIAGNOSIS — M7521 Bicipital tendinitis, right shoulder: Secondary | ICD-10-CM

## 2024-02-29 DIAGNOSIS — M19041 Primary osteoarthritis, right hand: Secondary | ICD-10-CM | POA: Diagnosis not present

## 2024-02-29 DIAGNOSIS — M76892 Other specified enthesopathies of left lower limb, excluding foot: Secondary | ICD-10-CM | POA: Diagnosis not present

## 2024-02-29 DIAGNOSIS — G8929 Other chronic pain: Secondary | ICD-10-CM

## 2024-02-29 DIAGNOSIS — M25511 Pain in right shoulder: Secondary | ICD-10-CM | POA: Diagnosis not present

## 2024-02-29 DIAGNOSIS — M25561 Pain in right knee: Secondary | ICD-10-CM | POA: Diagnosis not present

## 2024-02-29 DIAGNOSIS — M19049 Primary osteoarthritis, unspecified hand: Secondary | ICD-10-CM

## 2024-03-02 ENCOUNTER — Encounter: Payer: Self-pay | Admitting: Surgical

## 2024-03-02 MED ORDER — TRIAMCINOLONE ACETONIDE 40 MG/ML IJ SUSP
40.0000 mg | INTRAMUSCULAR | Status: AC | PRN
  Administered 2024-02-29: 40 mg via INTRA_ARTICULAR

## 2024-03-02 MED ORDER — BUPIVACAINE HCL 0.25 % IJ SOLN
0.3300 mL | INTRAMUSCULAR | Status: AC | PRN
  Administered 2024-02-29: .33 mL via INTRA_ARTICULAR

## 2024-03-02 MED ORDER — TRIAMCINOLONE ACETONIDE 40 MG/ML IJ SUSP
13.0000 mg | INTRAMUSCULAR | Status: AC | PRN
  Administered 2024-02-29: 13 mg via INTRA_ARTICULAR

## 2024-03-02 MED ORDER — LIDOCAINE HCL 1 % IJ SOLN
3.0000 mL | INTRAMUSCULAR | Status: AC | PRN
  Administered 2024-02-29: 3 mL

## 2024-03-02 MED ORDER — BUPIVACAINE HCL 0.5 % IJ SOLN
4.0000 mL | INTRAMUSCULAR | Status: AC | PRN
  Administered 2024-02-29: 4 mL via INTRA_ARTICULAR

## 2024-03-02 NOTE — Progress Notes (Signed)
 Office Visit Note   Patient: Vanessa Lara           Date of Birth: January 24, 1956           MRN: [id number] Visit Date: 02/29/2024 Requested by: Lorre Rosin, NP [address] [place],  [place] [address] PCP: Lorre Rosin, NP  Subjective: Chief Complaint  Patient presents with   Right Shoulder - Pain   Left Shoulder - Pain   Left Knee - Pain   Right Knee - Pain    HPI: Vanessa Lara is a 68 y.o. female who presents to the office reporting multiple joint complaints.  Patient complains primarily of left knee pain and right shoulder pain as well as right thumb pain.  She has left knee pain that has been bothering her more over the last few weeks without history of injury.  Right knee actually feeling good with minimal symptoms currently.  She has had multiple knee injections in the past.  Most of her pain is superior to the patella.  Additionally, she complains of right shoulder pain localizing to the anterior aspect of the shoulder.  No fall or injury.  No mechanical symptoms in the shoulder or any weakness but she has pain that will radiate into the bicep region.  No associated neck pain or numbness/tingling.  She recently had right radiocarpal injection with good relief of her wrist pain but she continues to have pain localizing to the base of the thumb.  She would like to try injection here..                ROS: All systems reviewed are negative as they relate to the chief complaint within the history of present illness.  Patient denies fevers or chills.  Assessment & Plan: Visit Diagnoses:  1. Chronic pain of both knees   2. Chronic right shoulder pain   3. Tendinitis of left quadriceps tendon     Plan: Impression is less of tendinitis in the right shoulder.  Ultrasound-guided injection administered into the bicipital groove; she has had good relief of shoulder pain with injections in the past.  Care was taken to avoid direct injection into the bicep tendon and  ultrasound was utilized to avoid vascular structures.  There is no resistance during administration of the injection and patient tolerated procedure well with good relief of her symptoms afterward.  She also has prior radiographs of her right wrist demonstrating arthritis of the first [facility] joint.  Injection administered into the right [facility] joint with the use of ultrasound as well.  Tolerated procedure well.  Left knee pain seems to be localizing more to the quad tendon itself rather than the source of her pain being intra-articular.  Rather than try cortisone injection, think that it may be worthwhile to try some dedicated physical therapy/home exercise program for quad tendinitis.  Will also try shockwave therapy with Dr. Vaughn Georges and see if this can help her.  Ultrasound exam today does demonstrate no discontinuity of the tendon  Follow-Up Instructions: No follow-ups on file.   Orders:  Orders Placed This Encounter  Procedures   XR Knee 1-2 Views Left   XR Shoulder Right   US  Guided Needle Placement - No Linked Charges   Ambulatory referral to Physical Therapy   AMB referral to sports medicine   No orders of the defined types were placed in this encounter.     Procedures: Bicipital groove injection on 02/29/2024 1:34 PM Indications:  diagnostic evaluation and pain Details: 22 G 1.5 in needle, posterior approach  Arthrogram: No  Medications: 4 mL bupivacaine  0.5 %; 40 mg triamcinolone  acetonide 40 MG/ML Outcome: tolerated well, no immediate complications Procedure, treatment alternatives, risks and benefits explained, specific risks discussed. Consent was given by the patient. Immediately prior to procedure a time out was called to verify the correct patient, procedure, equipment, support staff and site/side marked as required. Patient was prepped and draped in the usual sterile fashion.    Small Joint Inj: R thumb [facility] on 02/29/2024 1:36 PM Indications: pain Details: 25 G needle,  fluoroscopy-guided radial approach  Spinal Needle: No  Medications: 3 mL lidocaine  1 %; 0.33 mL bupivacaine  0.25 %; 13 mg triamcinolone  acetonide 40 MG/ML Outcome: tolerated well, no immediate complications Procedure, treatment alternatives, risks and benefits explained, specific risks discussed. Consent was given by the patient. Immediately prior to procedure a time out was called to verify the correct patient, procedure, equipment, support staff and site/side marked as required. Patient was prepped and draped in the usual sterile fashion.       Clinical Data: No additional findings.  Objective: Vital Signs: There were no vitals taken for this visit.  Physical Exam:  Constitutional: Patient appears well-developed HEENT:  Head: Normocephalic Eyes:EOM are normal Neck: Normal range of motion Cardiovascular: Normal rate Pulmonary/chest: Effort normal Neurologic: Patient is alert Skin: Skin is warm Psychiatric: Patient has normal mood and affect  Ortho Exam: Ortho exam demonstrates left knee with no effusion.  Able to perform straight leg raise.  She has minimal tenderness over the lateral joint line with some mild to moderate tenderness over the medial joint line.  No cellulitis or skin changes noted.  She has no significant palpable defect in the quad tendon though she is tender here primarily around the insertion on the superior pole of the patella.  No such tenderness in the right quad tendon.  Has slightly increased pain with resisted straight leg raise.  Right shoulder with intact rotator cuff strength of supra, infra, subscap.  Axillary nerve intact with deltoid firing.  No Popeye deformity noted.  She has tenderness over the bicipital groove moderately.  Positive O'Brien sign.  Palpable radial pulse of the right upper extremity.  Specialty Comments:  MRI CERVICAL SPINE WITHOUT CONTRAST   TECHNIQUE: Multiplanar, multisequence MR imaging of the cervical spine was performed.  No intravenous contrast was administered.   COMPARISON:  None.   FINDINGS: Alignment: 2 mm retrolisthesis of C5 on C6.   Vertebrae: No acute fracture, evidence of discitis, or bone lesion.   Cord: Normal signal and morphology.   Posterior Fossa, vertebral arteries, paraspinal tissues: Posterior fossa demonstrates no focal abnormality. Vertebral artery flow voids are maintained. Paraspinal soft tissues are unremarkable.   Disc levels:   Discs: Degenerative disease with disc height loss at C4-5 and C5-6.   C2-3: No significant disc bulge. No neural foraminal stenosis. No central canal stenosis.   C3-4: Mild broad-based disc bulge. No foraminal or central canal stenosis.   C4-5: Mild broad-based disc bulge. No foraminal or central canal stenosis.   C5-6: Mild broad-based disc bulge. Bilateral uncovertebral degenerative changes. Moderate left and severe right foraminal stenosis. No spinal stenosis.   C6-7: Mild broad-based disc bulge. No foraminal or central canal stenosis.   C7-T1: No significant disc bulge. No neural foraminal stenosis. No central canal stenosis.   IMPRESSION: 1. At C5-6 there is a mild broad-based disc bulge. Bilateral uncovertebral degenerative changes. Moderate left and  severe right foraminal stenosis. 2. Otherwise, mild cervical spine spondylosis as described above.     Electronically Signed   By: Onnie Bilis M.D.   On: 12/16/2021 10:51  Imaging: No results found.   PMFS History: Patient Active Problem List   Diagnosis Date Noted   Chronic gastritis without bleeding 08/01/2023   Generalized anxiety disorder 08/01/2023   Redundant colon 08/01/2023   Mixed hyperlipidemia 08/01/2023   High risk medication use 01/16/2023   History of CVA (cerebrovascular accident) 11/14/2022   Insomnia due to medical condition 03/15/2022   Degeneration of intervertebral disc of cervical region 12/28/2021   Medication monitoring encounter 12/05/2021    Physical deconditioning 11/11/2021   Elevated serum lactate dehydrogenase (LDH) 08/17/2021   Vitamin D  deficiency 08/17/2021   Chronic fatigue 07/25/2021   Vitamin B12 deficiency 07/19/2021   Hardening of the aorta (main artery of the heart) (HCC) 06/21/2021   Solitary lung nodule 06/15/2021   Loss of weight 05/18/2021   Protein-calorie malnutrition, severe (HCC) 05/18/2021   Osteoporosis 06/15/2020   Iron  deficiency anemia 04/09/2020   Primary osteoarthritis involving multiple joints 12/31/2018   Rheumatoid arthritis involving multiple sites with positive rheumatoid factor (HCC) 12/31/2018   Irritable bowel syndrome with both constipation and diarrhea 10/28/2018   Constipation 03/14/2016   Mitral valve prolapse 11/23/2015   High cholesterol 11/23/2015   Past Medical History:  Diagnosis Date   Anemia    Anxiety    Arthritis    RA   Bursitis    Cataract    CHF (congestive heart failure) (HCC)    Collagen vascular disease (HCC)    Depression    Endometriosis    Heart murmur    High cholesterol    Hypertension    IBS (irritable bowel syndrome)    Lymphadenopathy    Mitral valve prolapse    Osteoporosis    RA (rheumatoid arthritis) (HCC)    Rotator cuff tear    bilateral   Stroke (HCC) 06/2022   Uterus, adenomyosis     Family History  Problem Relation Age of Onset   Bone cancer Mother    Brain cancer Father    Brain cancer Sister    Breast cancer Sister    Renal Disease Sister    Pancreatic cancer Neg Hx    Esophageal cancer Neg Hx    Stomach cancer Neg Hx    Rectal cancer Neg Hx    Colon polyps Neg Hx     Past Surgical History:  Procedure Laterality Date   AXILLARY LYMPH NODE BIOPSY Right 10/12/2020   Procedure: RIGHT AXILLARY LYMPH NODE BIOPSY EXCISION;  Surgeon: Lockie Rima, MD;  Location: [facility];  Service: General;  Laterality: Right;   BUNIONECTOMY Right    CARPAL TUNNEL RELEASE Right    COLONOSCOPY     COLONOSCOPY N/A 12/13/2015    Procedure: COLONOSCOPY;  Surgeon: Ruby Corporal, MD;  [facility];  Service: Endoscopy;  Laterality: N/A;  9:55   Fatty tumor     2011 (left arm)   TONSILLECTOMY     TOTAL ABDOMINAL HYSTERECTOMY     2003   Social History   Occupational History   Occupation: retired  Tobacco Use   Smoking status: Some Days    Current packs/day: 0.00    Average packs/day: 0.5 packs/day for 33.0 years (16.5 ttl pk-yrs)    Types: Cigarettes    Start date: 72    Last attempt to quit: 2013  Years since quitting: 12.4   Smokeless tobacco: Never  Vaping Use   Vaping status: Never Used  Substance and Sexual Activity   Alcohol use: No    Alcohol/week: 0.0 standard drinks of alcohol   Drug use: No   Sexual activity: Not Currently

## 2024-03-03 ENCOUNTER — Inpatient Hospital Stay: Attending: Hematology

## 2024-03-03 ENCOUNTER — Ambulatory Visit (INDEPENDENT_AMBULATORY_CARE_PROVIDER_SITE_OTHER): Admitting: Orthopedic Surgery

## 2024-03-03 DIAGNOSIS — G8929 Other chronic pain: Secondary | ICD-10-CM | POA: Diagnosis not present

## 2024-03-03 DIAGNOSIS — M79671 Pain in right foot: Secondary | ICD-10-CM

## 2024-03-03 DIAGNOSIS — D500 Iron deficiency anemia secondary to blood loss (chronic): Secondary | ICD-10-CM

## 2024-03-03 DIAGNOSIS — M79672 Pain in left foot: Secondary | ICD-10-CM | POA: Diagnosis not present

## 2024-03-03 DIAGNOSIS — D649 Anemia, unspecified: Secondary | ICD-10-CM | POA: Insufficient documentation

## 2024-03-03 DIAGNOSIS — D72829 Elevated white blood cell count, unspecified: Secondary | ICD-10-CM | POA: Diagnosis not present

## 2024-03-03 DIAGNOSIS — D75839 Thrombocytosis, unspecified: Secondary | ICD-10-CM | POA: Insufficient documentation

## 2024-03-03 DIAGNOSIS — R627 Adult failure to thrive: Secondary | ICD-10-CM | POA: Insufficient documentation

## 2024-03-03 LAB — CBC WITH DIFFERENTIAL/PLATELET
Abs Immature Granulocytes: 0.06 10*3/uL (ref 0.00–0.07)
Basophils Absolute: 0.1 10*3/uL (ref 0.0–0.1)
Basophils Relative: 1 %
Eosinophils Absolute: 0.1 10*3/uL (ref 0.0–0.5)
Eosinophils Relative: 1 %
HCT: 31.5 % — ABNORMAL LOW (ref 36.0–46.0)
Hemoglobin: 9.9 g/dL — ABNORMAL LOW (ref 12.0–15.0)
Immature Granulocytes: 1 %
Lymphocytes Relative: 17 %
Lymphs Abs: 2.1 10*3/uL (ref 0.7–4.0)
MCH: 27.7 pg (ref 26.0–34.0)
MCHC: 31.4 g/dL (ref 30.0–36.0)
MCV: 88.2 fL (ref 80.0–100.0)
Monocytes Absolute: 0.7 10*3/uL (ref 0.1–1.0)
Monocytes Relative: 5 %
Neutro Abs: 9.6 10*3/uL — ABNORMAL HIGH (ref 1.7–7.7)
Neutrophils Relative %: 75 %
Platelets: 400 10*3/uL (ref 150–400)
RBC: 3.57 MIL/uL — ABNORMAL LOW (ref 3.87–5.11)
RDW: 16.5 % — ABNORMAL HIGH (ref 11.5–15.5)
WBC: 12.5 10*3/uL — ABNORMAL HIGH (ref 4.0–10.5)
nRBC: 0 % (ref 0.0–0.2)

## 2024-03-03 LAB — SAMPLE TO BLOOD BANK

## 2024-03-04 ENCOUNTER — Encounter: Payer: Self-pay | Admitting: Orthopedic Surgery

## 2024-03-04 ENCOUNTER — Other Ambulatory Visit: Payer: Self-pay | Admitting: Surgical

## 2024-03-04 NOTE — Progress Notes (Signed)
 Office Visit Note   Patient: Vanessa Lara           Date of Birth: 06-11-1956           MRN: [id number] Visit Date: 03/03/2024              Requested by: Lorre Rosin, NP [address] [place],  [place] [address] PCP: Lorre Rosin, NP  Chief Complaint  Patient presents with   Right Foot - Follow-up      HPI: Patient is a 68 year old woman who presents with bilateral forefoot pain.  Patient states she needs surgery but states she is in poor health has pain with walking.  She currently uses a cane.  Patient inquires regarding a shot in the forefoot.  Assessment & Plan: Visit Diagnoses: No diagnosis found.  Plan: Calluses were pared x 2.  Recommended a pair of extra-depth new balance walking shoes plus cork orthotics to help unload the forefoot moving pressure back to her arch.  Follow-Up Instructions: Return if symptoms worsen or fail to improve.   Ortho Exam  Patient is alert, oriented, no adenopathy, well-dressed, normal affect, normal respiratory effort. Examination patient has thin atrophic skin in both feet she has palpable pulses.  The metatarsal heads have a thin covering of skin without fat pad.  Patient has hypertrophic calluses beneath the metatarsal heads and after informed consent these were pared x 2.  Radiograph shows osteoarthritis of the great toe MTP joint.  Recommended using Voltaren  gel versus an injection. Narcotics: @CHLMME @  Imaging: No results found. No images are attached to the encounter.  Labs: Lab Results  Component Value Date   HGBA1C 5.1 11/02/2022   HGBA1C 5.7 (H) 03/19/2020   HGBA1C 5.5 11/06/2019   ESRSEDRATE 58 (H) 01/07/2024   ESRSEDRATE 55 (H) 11/19/2023   ESRSEDRATE 63 (H) 10/05/2023   CRP 4.4 10/03/2022   CRP 32.9 (H) 12/05/2021   CRP 1.7 (H) 08/23/2020     Lab Results  Component Value Date   ALBUMIN 3.2 (L) 12/24/2023   ALBUMIN 3.6 11/19/2023   ALBUMIN 3.3 (L) 10/05/2023    Lab Results  Component  Value Date   MG 2.0 06/14/2023   Lab Results  Component Value Date   VD25OH 75 01/07/2024   VD25OH 80 07/04/2023   VD25OH 72.68 02/02/2023    No results found for: "PREALBUMIN"    Latest Ref Rng & Units 03/03/2024    1:06 PM 01/22/2024    2:02 PM 01/07/2024   11:48 AM  CBC EXTENDED  WBC 4.0 - 10.5 K/uL 12.5  9.0  13.4   RBC 3.87 - 5.11 MIL/uL 3.57  3.91  3.67   Hemoglobin 12.0 - 15.0 g/dL 9.9  62.1  30.8   HCT 65.7 - 46.0 % 31.5  34.7  32.2   Platelets 150 - 400 K/uL 400  395  407   NEUT# 1.7 - 7.7 K/uL 9.6   10,693   Lymph# 0.7 - 4.0 K/uL 2.1        There is no height or weight on file to calculate BMI.  Orders:  No orders of the defined types were placed in this encounter.  No orders of the defined types were placed in this encounter.    Procedures: No procedures performed  Clinical Data: No additional findings.  ROS:  All other systems negative, except as noted in the HPI. Review of Systems  Objective: Vital Signs: There were no vitals taken for  this visit.  Specialty Comments:  MRI CERVICAL SPINE WITHOUT CONTRAST   TECHNIQUE: Multiplanar, multisequence MR imaging of the cervical spine was performed. No intravenous contrast was administered.   COMPARISON:  None.   FINDINGS: Alignment: 2 mm retrolisthesis of C5 on C6.   Vertebrae: No acute fracture, evidence of discitis, or bone lesion.   Cord: Normal signal and morphology.   Posterior Fossa, vertebral arteries, paraspinal tissues: Posterior fossa demonstrates no focal abnormality. Vertebral artery flow voids are maintained. Paraspinal soft tissues are unremarkable.   Disc levels:   Discs: Degenerative disease with disc height loss at C4-5 and C5-6.   C2-3: No significant disc bulge. No neural foraminal stenosis. No central canal stenosis.   C3-4: Mild broad-based disc bulge. No foraminal or central canal stenosis.   C4-5: Mild broad-based disc bulge. No foraminal or central  canal stenosis.   C5-6: Mild broad-based disc bulge. Bilateral uncovertebral degenerative changes. Moderate left and severe right foraminal stenosis. No spinal stenosis.   C6-7: Mild broad-based disc bulge. No foraminal or central canal stenosis.   C7-T1: No significant disc bulge. No neural foraminal stenosis. No central canal stenosis.   IMPRESSION: 1. At C5-6 there is a mild broad-based disc bulge. Bilateral uncovertebral degenerative changes. Moderate left and severe right foraminal stenosis. 2. Otherwise, mild cervical spine spondylosis as described above.     Electronically Signed   By: Onnie Bilis M.D.   On: 12/16/2021 10:51  PMFS History: Patient Active Problem List   Diagnosis Date Noted   Chronic gastritis without bleeding 08/01/2023   Generalized anxiety disorder 08/01/2023   Redundant colon 08/01/2023   Mixed hyperlipidemia 08/01/2023   High risk medication use 01/16/2023   History of CVA (cerebrovascular accident) 11/14/2022   Insomnia due to medical condition 03/15/2022   Degeneration of intervertebral disc of cervical region 12/28/2021   Medication monitoring encounter 12/05/2021   Physical deconditioning 11/11/2021   Elevated serum lactate dehydrogenase (LDH) 08/17/2021   Vitamin D  deficiency 08/17/2021   Chronic fatigue 07/25/2021   Vitamin B12 deficiency 07/19/2021   Hardening of the aorta (main artery of the heart) (HCC) 06/21/2021   Solitary lung nodule 06/15/2021   Loss of weight 05/18/2021   Protein-calorie malnutrition, severe (HCC) 05/18/2021   Osteoporosis 06/15/2020   Iron  deficiency anemia 04/09/2020   Primary osteoarthritis involving multiple joints 12/31/2018   Rheumatoid arthritis involving multiple sites with positive rheumatoid factor (HCC) 12/31/2018   Irritable bowel syndrome with both constipation and diarrhea 10/28/2018   Constipation 03/14/2016   Mitral valve prolapse 11/23/2015   High cholesterol 11/23/2015   Past Medical  History:  Diagnosis Date   Anemia    Anxiety    Arthritis    RA   Bursitis    Cataract    CHF (congestive heart failure) (HCC)    Collagen vascular disease (HCC)    Depression    Endometriosis    Heart murmur    High cholesterol    Hypertension    IBS (irritable bowel syndrome)    Lymphadenopathy    Mitral valve prolapse    Osteoporosis    RA (rheumatoid arthritis) (HCC)    Rotator cuff tear    bilateral   Stroke (HCC) 06/2022   Uterus, adenomyosis     Family History  Problem Relation Age of Onset   Bone cancer Mother    Brain cancer Father    Brain cancer Sister    Breast cancer Sister    Renal Disease Sister  Pancreatic cancer Neg Hx    Esophageal cancer Neg Hx    Stomach cancer Neg Hx    Rectal cancer Neg Hx    Colon polyps Neg Hx     Past Surgical History:  Procedure Laterality Date   AXILLARY LYMPH NODE BIOPSY Right 10/12/2020   Procedure: RIGHT AXILLARY LYMPH NODE BIOPSY EXCISION;  Surgeon: Lockie Rima, MD;  Location: [facility];  Service: General;  Laterality: Right;   BUNIONECTOMY Right    CARPAL TUNNEL RELEASE Right    COLONOSCOPY     COLONOSCOPY N/A 12/13/2015   Procedure: COLONOSCOPY;  Surgeon: Ruby Corporal, MD;  [facility];  Service: Endoscopy;  Laterality: N/A;  9:55   Fatty tumor     2011 (left arm)   TONSILLECTOMY     TOTAL ABDOMINAL HYSTERECTOMY     2003   Social History   Occupational History   Occupation: retired  Tobacco Use   Smoking status: Some Days    Current packs/day: 0.00    Average packs/day: 0.5 packs/day for 33.0 years (16.5 ttl pk-yrs)    Types: Cigarettes    Start date: 18    Last attempt to quit: 2013    Years since quitting: 12.4   Smokeless tobacco: Never  Vaping Use   Vaping status: Never Used  Substance and Sexual Activity   Alcohol use: No    Alcohol/week: 0.0 standard drinks of alcohol   Drug use: No   Sexual activity: Not Currently

## 2024-03-10 ENCOUNTER — Encounter: Payer: Self-pay | Admitting: Sports Medicine

## 2024-03-10 ENCOUNTER — Ambulatory Visit (INDEPENDENT_AMBULATORY_CARE_PROVIDER_SITE_OTHER): Admitting: Sports Medicine

## 2024-03-10 DIAGNOSIS — M79671 Pain in right foot: Secondary | ICD-10-CM

## 2024-03-10 DIAGNOSIS — M76892 Other specified enthesopathies of left lower limb, excluding foot: Secondary | ICD-10-CM | POA: Diagnosis not present

## 2024-03-10 DIAGNOSIS — M47812 Spondylosis without myelopathy or radiculopathy, cervical region: Secondary | ICD-10-CM | POA: Diagnosis not present

## 2024-03-10 DIAGNOSIS — M9901 Segmental and somatic dysfunction of cervical region: Secondary | ICD-10-CM | POA: Diagnosis not present

## 2024-03-10 DIAGNOSIS — M79605 Pain in left leg: Secondary | ICD-10-CM

## 2024-03-10 DIAGNOSIS — G8929 Other chronic pain: Secondary | ICD-10-CM

## 2024-03-10 DIAGNOSIS — M79672 Pain in left foot: Secondary | ICD-10-CM

## 2024-03-10 NOTE — Progress Notes (Signed)
 Vanessa Lara - 68 y.o. female MRN [id number]  Date of birth: 1956-08-03  Office Visit Note: Visit Date: 03/10/2024 PCP: Lorre Rosin, NP Referred by: Lorre Rosin, NP  Subjective: Chief Complaint  Patient presents with   Left Knee - Pain   HPI: Vanessa Lara is a pleasant 68 y.o. female who presents today for evaluation of left quadricep/leg and neck pain.  Left leg/quad -having pain more so in the quadricep, pointing to the mid belly and extending into the quadricep tendon.  Saw one of my partners, Luke Magnant, who recommended trial of shockwave and other treatment modalities.  Neck -still having neck pain at the base of the skull.  She has been applying topical Voltaren  gel without much relief.  She is inquiring about heat to this area.  Feet -saw Dr. Julio Ohm for this who recommended modification of shoe wear as well as cork insoles to help offload the forefoot.  Pertinent ROS were reviewed with the patient and found to be negative unless otherwise specified above in HPI.   Assessment & Plan: Visit Diagnoses:  1. Segmental and somatic dysfunction of cervical region   2. Spondylosis of cervical region without myelopathy or radiculopathy   3. Tendinitis of left quadriceps tendon   4. Pain in left leg   5. Chronic pain of both feet    Plan: Impression is chronic neck pain with both spondylosis as well as soft tissue paraspinal hypertonicity and diminished CRI on osteopathic examination.  We did perform OMT for the cervical region today, posttreatment discussion had.  Also would like her to get started in cervical isometrics, a customized handout was provided and I did review these exercises with her today.  She will begin these and perform once to twice daily consistently.  Also recommended IcyHot and heat to the back of the neck.  She may continue her Tylenol  twice daily and she does have her fentanyl  50 mcg/h patch through hospice care team that she may use for her  breakthrough pain.  Okay for Flexeril  5 mg as needed.  In terms of her left leg, this seems to emanate more from the quadricep tendon with a degree of both tendinitis and muscular atrophy.  We did perform a trial of extracorporeal shockwave therapy.  I would like to perform 1 additional treatment and then see what sort of cumulative benefit she has going forward.  She will follow-up next week for a repeat treatment and further evaluation.  I did reemphasize Dr. Aniceto Kern recommendations with shoewear modification and orthotic insole to help offload the forefoot.  Follow-up: Return in about 9 days (around 03/19/2024) for f/u 6/18 or 6/19 for R-quad and neck (30-min for procedure).   Meds & Orders: No orders of the defined types were placed in this encounter.  No orders of the defined types were placed in this encounter.    Procedures: Procedure: ECSWT Indications:  Quadriceps muscle pain/tendinitis   Procedure Details Consent: Risks of procedure as well as the alternatives and risks of each were explained to the patient.  Verbal consent for procedure obtained. Time Out: Verified patient identification, verified procedure, site was marked, verified correct patient position. The area was cleaned with alcohol swab.     The Left Quadriceps muscle and tendon was targeted for Extracorporeal shockwave therapy.    Preset: Muscle/Tendinitis Power Level: 70 mJ Frequency: 9 Hz Impulse/cycles: 1800 Head size: Regular   Patient tolerated procedure well without immediate complications.  Osteopathic Manipulative Treatment: - Cervical region: Suboccipital release, strain-counterstrain, muscle energy  Clinical History: MRI CERVICAL SPINE WITHOUT CONTRAST   TECHNIQUE: Multiplanar, multisequence MR imaging of the cervical spine was performed. No intravenous contrast was administered.   COMPARISON:  None.   FINDINGS: Alignment: 2 mm retrolisthesis of C5 on C6.   Vertebrae: No acute  fracture, evidence of discitis, or bone lesion.   Cord: Normal signal and morphology.   Posterior Fossa, vertebral arteries, paraspinal tissues: Posterior fossa demonstrates no focal abnormality. Vertebral artery flow voids are maintained. Paraspinal soft tissues are unremarkable.   Disc levels:   Discs: Degenerative disease with disc height loss at C4-5 and C5-6.   C2-3: No significant disc bulge. No neural foraminal stenosis. No central canal stenosis.   C3-4: Mild broad-based disc bulge. No foraminal or central canal stenosis.   C4-5: Mild broad-based disc bulge. No foraminal or central canal stenosis.   C5-6: Mild broad-based disc bulge. Bilateral uncovertebral degenerative changes. Moderate left and severe right foraminal stenosis. No spinal stenosis.   C6-7: Mild broad-based disc bulge. No foraminal or central canal stenosis.   C7-T1: No significant disc bulge. No neural foraminal stenosis. No central canal stenosis.   IMPRESSION: 1. At C5-6 there is a mild broad-based disc bulge. Bilateral uncovertebral degenerative changes. Moderate left and severe right foraminal stenosis. 2. Otherwise, mild cervical spine spondylosis as described above.     Electronically Signed   By: Onnie Bilis M.D.   On: 12/16/2021 10:51  She reports that she has been smoking cigarettes. She started smoking about 45 years ago. She has a 16.5 pack-year smoking history. She has never used smokeless tobacco. No results for input(s): "HGBA1C", "LABURIC" in the last 8760 hours.  Objective:    Physical Exam  Gen: in no acute distress; non-toxic CV: Well-perfused. Warm.  Resp: Breathing unlabored on room air; no wheezing. Psych: Fluid speech in conversation; appropriate affect; normal thought process  Ortho Exam - Left quad/leg: + mild TTP in mid-belly of RF and quadriceps. There is notable muscle atrophy bilaterally in proximal legs.  - Neck: Diminished CRI. FROM in all directions but  with pain. Negative Spurling's test.  Imaging: No results found.  Past Medical/Family/Surgical/Social History: Medications & Allergies reviewed per EMR, new medications updated. Patient Active Problem List   Diagnosis Date Noted   Chronic gastritis without bleeding 08/01/2023   Generalized anxiety disorder 08/01/2023   Redundant colon 08/01/2023   Mixed hyperlipidemia 08/01/2023   High risk medication use 01/16/2023   History of CVA (cerebrovascular accident) 11/14/2022   Insomnia due to medical condition 03/15/2022   Degeneration of intervertebral disc of cervical region 12/28/2021   Medication monitoring encounter 12/05/2021   Physical deconditioning 11/11/2021   Elevated serum lactate dehydrogenase (LDH) 08/17/2021   Vitamin D  deficiency 08/17/2021   Chronic fatigue 07/25/2021   Vitamin B12 deficiency 07/19/2021   Hardening of the aorta (main artery of the heart) (HCC) 06/21/2021   Solitary lung nodule 06/15/2021   Loss of weight 05/18/2021   Protein-calorie malnutrition, severe (HCC) 05/18/2021   Osteoporosis 06/15/2020   Iron  deficiency anemia 04/09/2020   Primary osteoarthritis involving multiple joints 12/31/2018   Rheumatoid arthritis involving multiple sites with positive rheumatoid factor (HCC) 12/31/2018   Irritable bowel syndrome with both constipation and diarrhea 10/28/2018   Constipation 03/14/2016   Mitral valve prolapse 11/23/2015   High cholesterol 11/23/2015   Past Medical History:  Diagnosis Date   Anemia    Anxiety    Arthritis  RA   Bursitis    Cataract    CHF (congestive heart failure) (HCC)    Collagen vascular disease (HCC)    Depression    Endometriosis    Heart murmur    High cholesterol    Hypertension    IBS (irritable bowel syndrome)    Lymphadenopathy    Mitral valve prolapse    Osteoporosis    RA (rheumatoid arthritis) (HCC)    Rotator cuff tear    bilateral   Stroke (HCC) 06/2022   Uterus, adenomyosis    Family History   Problem Relation Age of Onset   Bone cancer Mother    Brain cancer Father    Brain cancer Sister    Breast cancer Sister    Renal Disease Sister    Pancreatic cancer Neg Hx    Esophageal cancer Neg Hx    Stomach cancer Neg Hx    Rectal cancer Neg Hx    Colon polyps Neg Hx    Past Surgical History:  Procedure Laterality Date   AXILLARY LYMPH NODE BIOPSY Right 10/12/2020   Procedure: RIGHT AXILLARY LYMPH NODE BIOPSY EXCISION;  Surgeon: Lockie Rima, MD;  Location: [facility];  Service: General;  Laterality: Right;   BUNIONECTOMY Right    CARPAL TUNNEL RELEASE Right    COLONOSCOPY     COLONOSCOPY N/A 12/13/2015   Procedure: COLONOSCOPY;  Surgeon: Ruby Corporal, MD;  [facility];  Service: Endoscopy;  Laterality: N/A;  9:55   Fatty tumor     2011 (left arm)   TONSILLECTOMY     TOTAL ABDOMINAL HYSTERECTOMY     2003   Social History   Occupational History   Occupation: retired  Tobacco Use   Smoking status: Some Days    Current packs/day: 0.00    Average packs/day: 0.5 packs/day for 33.0 years (16.5 ttl pk-yrs)    Types: Cigarettes    Start date: 54    Last attempt to quit: 2013    Years since quitting: 12.4   Smokeless tobacco: Never  Vaping Use   Vaping status: Never Used  Substance and Sexual Activity   Alcohol use: No    Alcohol/week: 0.0 standard drinks of alcohol   Drug use: No   Sexual activity: Not Currently   Patient was instructed in 10 minutes of therapeutic exercises for cervical spine and cervical paraspinals to improve strength, ROM and function according to my instructions and plan of care by myself during the office visit. A customized handout was provided and demonstration of proper technique shown and discussed. Patient did perform exercises and demonstrate understanding through teachback.  All questions discussed and answered.  Shauna Del, DO Primary Care Sports Medicine Physician  [facility] -  Orthopedics

## 2024-03-10 NOTE — Progress Notes (Signed)
 Patient has been having pain in the quad above the left knee. She says that [facility] advised she may have some benefit from shockwave therapy. She is inquiring about injections for her back today, although she does have an appointment scheduled on 03/27/2024 for those injections, as it is currently too soon to repeat those.   Patient mentions pain in her neck "just below her skull" that she had evaluated by Dr. Vaughn Georges at her last visit. She would like to have that looked at again today as it has continued to be been bothersome.

## 2024-03-17 ENCOUNTER — Inpatient Hospital Stay

## 2024-03-18 ENCOUNTER — Inpatient Hospital Stay

## 2024-03-18 DIAGNOSIS — D500 Iron deficiency anemia secondary to blood loss (chronic): Secondary | ICD-10-CM

## 2024-03-18 DIAGNOSIS — D649 Anemia, unspecified: Secondary | ICD-10-CM | POA: Diagnosis not present

## 2024-03-18 LAB — CBC WITH DIFFERENTIAL/PLATELET
Abs Immature Granulocytes: 0.03 10*3/uL (ref 0.00–0.07)
Basophils Absolute: 0.1 10*3/uL (ref 0.0–0.1)
Basophils Relative: 1 %
Eosinophils Absolute: 0.1 10*3/uL (ref 0.0–0.5)
Eosinophils Relative: 1 %
HCT: 32.5 % — ABNORMAL LOW (ref 36.0–46.0)
Hemoglobin: 10.2 g/dL — ABNORMAL LOW (ref 12.0–15.0)
Immature Granulocytes: 0 %
Lymphocytes Relative: 18 %
Lymphs Abs: 1.7 10*3/uL (ref 0.7–4.0)
MCH: 27.8 pg (ref 26.0–34.0)
MCHC: 31.4 g/dL (ref 30.0–36.0)
MCV: 88.6 fL (ref 80.0–100.0)
Monocytes Absolute: 0.4 10*3/uL (ref 0.1–1.0)
Monocytes Relative: 4 %
Neutro Abs: 7 10*3/uL (ref 1.7–7.7)
Neutrophils Relative %: 76 %
Platelets: 413 10*3/uL — ABNORMAL HIGH (ref 150–400)
RBC: 3.67 MIL/uL — ABNORMAL LOW (ref 3.87–5.11)
RDW: 16.6 % — ABNORMAL HIGH (ref 11.5–15.5)
WBC: 9.3 10*3/uL (ref 4.0–10.5)
nRBC: 0 % (ref 0.0–0.2)

## 2024-03-18 LAB — IRON AND TIBC
Iron: 18 ug/dL — ABNORMAL LOW (ref 28–170)
Saturation Ratios: 7 % — ABNORMAL LOW (ref 10.4–31.8)
TIBC: 247 ug/dL — ABNORMAL LOW (ref 250–450)
UIBC: 229 ug/dL

## 2024-03-18 LAB — SAMPLE TO BLOOD BANK

## 2024-03-18 LAB — COMPREHENSIVE METABOLIC PANEL WITH GFR
ALT: 9 U/L (ref 0–44)
AST: 17 U/L (ref 15–41)
Albumin: 3.1 g/dL — ABNORMAL LOW (ref 3.5–5.0)
Alkaline Phosphatase: 132 U/L — ABNORMAL HIGH (ref 38–126)
Anion gap: 10 (ref 5–15)
BUN: 10 mg/dL (ref 8–23)
CO2: 26 mmol/L (ref 22–32)
Calcium: 8.7 mg/dL — ABNORMAL LOW (ref 8.9–10.3)
Chloride: 100 mmol/L (ref 98–111)
Creatinine, Ser: 0.67 mg/dL (ref 0.44–1.00)
GFR, Estimated: >60 mL/min (ref >60)
Glucose, Bld: 123 mg/dL — ABNORMAL HIGH (ref 70–99)
Potassium: 3.7 mmol/L (ref 3.5–5.1)
Sodium: 136 mmol/L (ref 135–145)
Total Bilirubin: 0.3 mg/dL (ref 0.0–1.2)
Total Protein: 7 g/dL (ref 6.5–8.1)

## 2024-03-18 LAB — FERRITIN: Ferritin: 56 ng/mL (ref 11–307)

## 2024-03-20 ENCOUNTER — Telehealth: Payer: Self-pay

## 2024-03-20 ENCOUNTER — Ambulatory Visit: Admitting: Sports Medicine

## 2024-03-20 NOTE — Telephone Encounter (Signed)
 FYI  Patient called states she had an appt for 2 pm but is unable to come. She wanted me to let you know she is stiff and in a lot of pain that is why she can't come. She R/S her appt  for Monday.    [phone]

## 2024-03-23 NOTE — Progress Notes (Unsigned)
 VIRTUAL VISIT via TELEPHONE NOTE [facility]   I connected with Vanessa Lara  on 03/24/2024 at 3:10 PM by telephone and verified that I am speaking with the correct person using two identifiers.  Location: Patient: Home Provider: [facility]   I discussed the limitations, risks, security and privacy concerns of performing an evaluation and management service by telephone and the availability of in person appointments. I also discussed with the patient that there may be a patient responsible charge related to this service. The patient expressed understanding and agreed to proceed.  REASON FOR VISIT:  Follow-up for normocytic anemia   CURRENT THERAPY: Intermittent IV iron  (last dose of Venofer  200 mg on 01/14/2024)  INTERVAL HISTORY:   Vanessa Lara 68 y.o. female returns for routine follow-up of normocytic anemia.  She was last seen by Vanessa Barefoot PA-C on 12/24/2023, as well as telephone visit on 01/28/2024.   At today's visit, she reports feeling poorly due to severe fatigue and worsening joint pain from rheumatoid arthritis. She continues to suffer from chronic pain, malnutrition, weight loss, severe fatigue, and general malaise.  She is seeing multiple specialists for workup and management of these chronic issues.  Since last visit, she received IV Venofer  200 mg on 12/27/2023 and 01/14/2024.   She denies having felt any improvement in energy after her most recent iron , although she had previously found it helpful.  She also reports severe cold intolerance, dyspnea on exertion, and palpitations.  Occasional dizziness and headaches.  No chest pain.   She reports generalized weakness and deconditioning, with increased heart rate and tachypnea reported with exertion. She continues to deny any obvious rectal bleeding, melena, epistaxis, or other source of bleeding.  She most recently received steroid injection via orthopedist on 02/14/2024.   She stopped taking  her prednisone  due to it making her feel just awful.  She takes Plaquenil  5 days weekly.    No B symptoms, masses, or lymphadenopathy.  She has little to no energy and 50% appetite.   She continues to be severely underweight and cachectic.  ASSESSMENT & PLAN:  1.  Normocytic anemia - (anemia of chronic disease +/- iron  deficiency) - History of significant iron  deficiency with mild anemia (Hgb ranging from 11.0-12.0) since at least 2021. - Moderate to severe anemia (Hgb <10.0) since October 2024 (NOTE: Started on Plaquenil  in June 2024 for rheumatoid arthritis) - PRBC transfusion x 1 on 10/30/2023 due to Hgb 7.8, symptomatic - She was Hemoccult stool POSITIVE x 3. - Most recent EGD (06/18/2023): Normal esophagus.  Diffuse mildly congestive gastric mucosa.  Normal mucosa in duodenum.  Biopsies taken for evaluation of celiac disease were NEGATIVE. - Most recent colonoscopy (06/18/2023): Decreased sphincter tone.  Otherwise normal colon. - Capsule endoscopy (02/08/2021): Complete capsule endoscopy with no findings to explain patient's symptoms - CT enterography abdomen/pelvis (01/04/2021): No abnormal findings to explain chronic iron  deficiency   - Anemia workup summarized below:  Negative for hemolysis (normal haptoglobin, bilirubin, LDH, DAT) Nutritional panel: Iron  deficiency/functional iron  deficiency, but otherwise normal B12, MMA, folate, copper . Inflammatory markers elevated. Kidney function is normal to date SPEP, immunofixation, light chains negative for monoclonal gammopathy, but demonstrate polyclonal gammopathy that is consistent with her chronic autoimmune disease.  - Bone marrow biopsy (12/10/2023): Normocellular bone marrow with trilineage hematopoiesis.  Occasional megakaryocytes with abnormal lobation are noted, but do not reach threshold of 10% for dysplasia.  No increased blasts.  Secondary causes of patient's anemia should  be considered.  Cytogenetics with normal female karyotype.   - Most recent IV iron  with Venofer  200 mg on 12/27/2023 and 01/14/2024. - REPORTED SYMPTOMS AFTER IV IRON  She reports that her IBS seems to flareup following her IV iron  infusions, with worsening diarrhea and constipation that alternate for 5 to 7 days following each iron  infusion  - No bright red blood per rectum or melena. - She has severe chronic fatigue, which is multifactorial - Most recent labs (03/18/2024): Hgb 10.2/MCV 88.6 Ferritin 56, iron  saturation 7%, marginally low TIBC 247 and low serum iron  18 Creatinine 0.67 with normal GFR. - DIFFERENTIAL DIAGNOSIS favors anemia of chronic disease with functional iron  deficiency, occult GI blood loss, and likely malabsorption secondary to chronic inflammation.  She may also have some drug-induced myelosuppression related to Plaquenil  (note that Plaquenil  effects on bone marrow are not always directly observed on bone marrow biopsy). - PLAN: Due to reported side effects from Venofer , we will schedule her for IV Feraheme  510 mg x 2 doses, to be given [facility] 1 month apart.  (Pending prior authorization from insurance) - Continue CBC/BB sample monthly   - Discussion with other specialists: Gastroenterology: Patient is in the process of establishing care with new gastroenterologist (Dr. Malachy in [place]).  Once established, we will discuss with Dr. Malachy regarding possible ongoing blood loss if she has any further severe drops in hemoglobin. - RTC 3 months for labs and OFFICE visit  - Patient continues to demand blood transfusions to help her feel better, and was again reminded that she does not meet criteria for blood transfusions and that receiving blood transfusions would not solve her underlying medical issues that are causing her to feel poorly.  2.  Leukocytosis & thrombocytosis - Past CBCs reveal intermittent leukocytosis (neutrophil predominant) and thrombocytosis since at least 2020 - MPN work-up was negative for JAK2  V617F, JAK2 exon 12, MPL and CALR.  Reflex testing to NGS did not reveal any abnormal mutations.  (2021) - BCR/ABL FISH was negative (2021) - Inflammatory markers have been elevated in the setting of rheumatoid arthritis  - Bone marrow biopsy (12/10/2023): Normocellular bone marrow with trilineage hematopoiesis.  Occasional megakaryocytes with abnormal lobation are noted, but do not reach threshold of 10% for dysplasia.  No increased blasts.  Secondary causes of patient's anemia should be considered.  Cytogenetics with normal female karyotype. - She receives frequent steroid injections for degenerative joint disease, most recently on 12/14/2023.  She is also taking prednisone  5 mg daily since December 2024. - Findings are consistent with chronic inflammation, chronic steroid use, and reactive leukocytosis/thrombocytosis - Most recent CBC (03/18/2024) with WBC 9.3, platelets 413 (NOTE: Received methylprednisolone  injection on 02/14/2024) - PLAN: Most likely represents reactive leukocytosis/thrombocytosis.  We will continue active surveillance.  3.  Adult failure to thrive - Patient has had intermittent episodes of weight loss, malnutrition, and decreased functional status ongoing for the past several years and likely related to her severe RA and GI issues - She follows with her PCP, GI, rheumatology, and endocrinology for severe IBS and weight loss. - She is up-to-date on routine cancer screening such as colonoscopy and mammogram. - She has had extensive previous imaging including PET scan in October 2021, all of which has been negative for malignancy - Due to strong family history of malignancy, she was previously recommended for [facility] testing, but this was not covered by insurance and was cost prohibitive. - PLAN: Weight loss and malnutrition is being  managed by her PCP.   4.  PET positive right axillary lymph node - PET scan obtained 07/09/2020 due to unintentional weight loss showed mildly  hypermetabolic right axillary lymph node - Right axillary lymph node was palpable on exam (November 2021) - She has completed 5 days of azithromycin  as she had a history of cat bite. - She was advised by Dr. Federico that her lymph node was likely reactive and related to her underlying inflammatory disease, but patient insisted on biopsy. - Patient opted for right axillary lymph node biopsy (10/12/2020), which showed benign reactive lymph node, no malignancy   5.  PET positive ileocecal lesion - PET scan obtained 07/09/2020 for unintentional weight loss showed accentuated activity along the distalmost terminal ileum and ileocecal valve, possibly physiologic since there was no mass or specific abnormality in this region on diagnostic CT as of 06/23/2020. - Patient had colonoscopy on 08/12/2020, in which the cecum and IC valvle appeared normal. Terminal ileum could not be intubated due to restricted sigmoid and redundant colon. - CT abdomen/pelvis with contrast (01/04/2021): No abnormal bowel wall thickening or abnormal mural enhancement in the stomach, small bowel, or colon.  No findings to suggest inflammatory or chronic fibrous small bowel stricture.  No mesenteric or intraperitoneal free fluid.  Terminal ileum was normal, no edema or inflammation in the region of the cecum.   6.  Osteoporosis - DEXA scan on 04/13/2020 shows T score -4.2. - She follows with endocrinology and orthopedics   7.  Rheumatoid arthritis - She follows with Dr. Jeannetta. - Previously followed with Dr. Mai / Rosaline Salt of [facility]  8.  Other issues - Patient expressed some emotional distress and difficulty coping with chronic illness and chronic pain. - She is established with palliative care services. - We discussed that she may benefit from adding a licensed counselor/therapist to her care team, but she feels overwhelmed with the idea of having another place to go to.  She was very excited to learn that  online therapy may be an option. - PLAN: Patient provided with resources for coping with chronic illness, including information regarding various online counseling/therapy services.   PLAN SUMMARY: >> IV Feraheme  510 mg once a month x 2 (at [facility])  >> CBC/BB sample at [facility] every month >> Labs in 3 months = CBC/D, CMP, ferritin, iron /TIBC, BB sample >> OFFICE visit in 3 months (1 week after labs)     REVIEW OF SYSTEMS:   Review of Systems  Constitutional:  Negative for chills, diaphoresis and fever.  HENT:  Negative for nosebleeds.   Respiratory:  Positive for shortness of breath. Negative for cough and hemoptysis.   Cardiovascular:  Positive for palpitations. Negative for chest pain.  Gastrointestinal:  Positive for constipation, diarrhea, nausea and vomiting. Negative for abdominal pain and blood in stool.  Genitourinary:  Negative for hematuria.  Musculoskeletal:  Positive for back pain, myalgias and neck pain.  Skin: Negative.   Neurological:  Positive for dizziness and headaches.  Endo/Heme/Allergies:  Does not bruise/bleed easily.  Psychiatric/Behavioral:  Positive for depression. The patient is nervous/anxious.      PHYSICAL EXAM: (per limitations of virtual telephone visit)  The patient is alert and oriented x 3, exhibiting adequate mentation, good mood, and ability to speak in full sentences and execute sound judgement.  WRAP UP:   I discussed the assessment and treatment plan with the patient. The patient was provided an opportunity to ask questions and all were answered.  The patient agreed with the plan and demonstrated an understanding of the instructions.   The patient was advised to call back or seek an in-person evaluation if the symptoms worsen or if the condition fails to improve as anticipated.  I provided 45 minutes of non-face-to-face time during this encounter, including >20 minutes of medical discussion.  Vanessa CHRISTELLA Barefoot,  PA-C 03/25/24 2:13 PM

## 2024-03-24 ENCOUNTER — Inpatient Hospital Stay (HOSPITAL_BASED_OUTPATIENT_CLINIC_OR_DEPARTMENT_OTHER): Admitting: Physician Assistant

## 2024-03-24 ENCOUNTER — Ambulatory Visit (INDEPENDENT_AMBULATORY_CARE_PROVIDER_SITE_OTHER): Admitting: Sports Medicine

## 2024-03-24 DIAGNOSIS — M47812 Spondylosis without myelopathy or radiculopathy, cervical region: Secondary | ICD-10-CM

## 2024-03-24 DIAGNOSIS — M79641 Pain in right hand: Secondary | ICD-10-CM

## 2024-03-24 DIAGNOSIS — M19049 Primary osteoarthritis, unspecified hand: Secondary | ICD-10-CM | POA: Diagnosis not present

## 2024-03-24 DIAGNOSIS — M0579 Rheumatoid arthritis with rheumatoid factor of multiple sites without organ or systems involvement: Secondary | ICD-10-CM | POA: Diagnosis not present

## 2024-03-24 DIAGNOSIS — M79642 Pain in left hand: Secondary | ICD-10-CM

## 2024-03-24 DIAGNOSIS — D500 Iron deficiency anemia secondary to blood loss (chronic): Secondary | ICD-10-CM | POA: Diagnosis not present

## 2024-03-24 DIAGNOSIS — G894 Chronic pain syndrome: Secondary | ICD-10-CM

## 2024-03-24 NOTE — Progress Notes (Signed)
 Patient says that her knee is doing better since the shockwave therapy. She has had a lot of pain and locking up of her muscles. She says that she has done her home exercises for her neck, and they are not doing much to help. She took her Tizanidine  which did help with these symptoms last week, although she is asking about an oral muscle relaxer today.

## 2024-03-24 NOTE — Progress Notes (Deleted)
 Office Visit Note  Patient: Vanessa Lara             Date of Birth: 09/13/1956           MRN: [id number]             PCP: Suanne Pfeiffer, NP Referring: Suanne Pfeiffer, NP Visit Date: 04/07/2024   Subjective:  No chief complaint on file.   History of Present Illness: Vanessa Lara is a 68 y.o. female here for follow up with rheumatoid arthritis on HCQ 200 mg daily x5 days weekly who presents with severe pain and medication intolerance.    Previous HPI 01/07/2024 Vanessa Lara is a 68 year old female with rheumatoid arthritis on HCQ 200 mg daily x5 days weekly who presents with severe pain and medication intolerance.   She experiences severe pain due to arthritis, exacerbated by muscle spasms in her back. The pain is intense and significantly impacts her quality of life, leaving her feeling 'miserable' and without a 'happy place'. Flare-ups occur every other day or every third day, preventing her from performing daily activities.   She has a history of intolerance to several medications. Azathioprine  caused severe gastrointestinal issues, and she is currently on hydroxychloroquine  for arthritis. She cannot take codeine  due to gastrointestinal side effects related to her IBS. Muscle relaxants like Flexeril  have been ineffective in managing her symptoms.   A recent bone marrow study was performed without anesthesia due to low blood pressure, which she found distressing. She experiences dizziness throughout the day, which she suspects may be related to anemia or other underlying issues. She has gained a small amount of weight recently, about four ounces.   She has been unable to follow up with an orthopedist due to her condition flaring up, making it difficult for her to travel. She also struggles with arranging transportation as her plans often fall through at the last minute.      Previous HPI 11/19/23 Vanessa Lara is a 68 year old female with rheumatoid arthritis on  hydroxychloroquine  200 mg once daily for 5 days per week.   She experiences significant pain and stiffness, particularly on Sundays, leading her to take an additional half dose of her medication on Sundays for the past two weekends. She describes waking up with severe stiffness and being unable to move by Monday if she does not take the extra dose. She is seeking an alternative medication to manage her symptoms more effectively.   She describes pain associated with bursitis and possible rotator cuff issues, noting a pressure point that radiates from her shoulder down towards her armpit and into her upper arm, forming a 'V' shape. She is scheduled to receive an injection for this pain.   She reports issues with her foot, including a previously twisted or sprained ankle that healed but then became painful and red, particularly around a bone that feels bruised. She also mentions a rod in her big toe that has shifted, causing pain and misalignment of her toes. She was previously told that surgery could correct her toes, but now her arthritis and varicose veins have worsened, raising concerns about circulation, infection, and potential amputation. Her bone density is very low, complicating surgical options.   She mentions her knuckles are sore, with one being particularly bad, and she experiences pain that radiates down her ear. She also notes fluctuations in her weight, questioning whether it is due to scale differences or her condition.  Previous HPI 07/04/23 Vanessa Lara is a 68 y.o. female here for follow up for seropositive RA and osteoarthritis and osteoporosis she stopped hydroxychloroquine  after last visit due to concern about cardiac arrhythmia alongside Paxil  and duloxetine .  She was only able to take a low-dose of the medication she did feel it improved her overall condition but still had persistent arthritis of numerous areas regardless.  She had an updated bone density scan on July 15 still  with osteoporosis of the lumbar spine although better than 2022 and with femur osteopenia similar to the previous test.  She is taking Venofer  for iron  deficiency anemia this ongoing to correct it with her goal of enough to tolerate foot surgery.  She underwent endoscopy with no upper or lower GI findings to account for heme positive stool. Acute problem today increased right ankle pain and swelling started since 3 weeks ago.  She did not recall any fall trip or other trauma to that area.  Initially there was a high degree of swelling he put a wrap around this and can see prominent indentation the swelling has largely decreased compared to the outset but still has pain with direct pressure and with weightbearing.   Imaging reviewed DEXA Review [facility] Spine L1-L2 04/16/23  -3.8    0.712 g/cm2 04/12/21  -4.5    0.627 g/cm2 04/13/20  -4.2    0.665 g/cm2   DualFemur Total Mean 04/16/23  -2.6    0.680 g/cm2 04/12/21  -2.6    0.676 g/cm2 04/13/20  -2.2    0.736 g/cm2     03/08/23 Vanessa Lara is a 68 y.o. female here for follow up for seropositive RA.  Currently off any disease specific medication she cannot tolerate taking the sulfasalazine  due to severe stomach irritation described as burning up her stomach.  Continues having joint pain and stiffness on a daily basis overall feels her function is very poor due to the severity.  Had labs last month still anemic at hemoglobin of 9.8 being treated with infusion for iron  deficiency anemia.   01/16/23 Vanessa Lara is a 68 y.o. female here for follow up for seropositive RA.  Currently health maintenance treatment after discontinuing 5 mg prednisone  is on medication for joint pain for RA along with osteoarthritis and chronic pain syndrome.  Had recent knee steroid injection since her last visit but reports overall widespread symptoms are severe.  Particularly is been noticing some increased trouble with pain and some swelling involving the wrist worse on left  side.   10/03/22 Vanessa Lara is a 68 y.o. female here for follow up for seropositive RA currently off long term DMARD with prednisone  5 mg daily as needed.  She is overall not doing well feels like pain and stiffness is a big problem but more severe is her fatigue and feels a generalized getting weaker with difficulty moving and with her upper body strength.  She pretty consistently has to take the prednisone  for otherwise does not move around much during the day.  Continues to get a benefit with the Flexeril  at nighttime with slight decrease in frequency of awakenings.  She is also concerned about getting an update to her bone density testing ongoing oral alendronate  treatment.  She is also still having a lot of pain and difficulty with walking from her claw toe deformities on the foot and planning to follow-up with surgery clinic about management of this.   06/28/22 JASHIRA COTUGNO is a 68 y.o. female  here for follow up for seropositive RA now off of treatment after stopping the Kevzara .  Since she stopped taking the medication her complaint of night sweats and fatigue have improved. She has seen a worsening of joint pain in several areas particularly around the MCP joints of both hands. For knee pain she saw orthopedic surgery clinic yesterday and had injections on both sides.  She also discussed getting set up to do right SI joint steroid injection.  Biggest complaint today is back pain on the right side around the middle of the back and around the scapula.  This bothers her especially with certain movements and trying to bend side to side.    05/18/2022  CLARISA DANSER is a 68 y.o. female here for follow up for seropositive rheumatoid arthritis on Kevzara  150 mg Reeves q14days. She feels arthritis has been fairly well controlled but has pain worse in her left foot mostly. She had GI issues suspected UTI but with negative culture since last visit. She feels a sensation like her brain is numb. Also has  ongoing upper neck pain without radiating symptoms.    02/07/2022 JANAIAH VETRANO is a 68 y.o. female here for follow up for rheumatoid arthritis with chronic joint pain in multiple areas and chronic fatigue labs showing elevated rheumatoid factor. Kevzara  new start on 12/15/2021. She felt an improvement when taking the injections within first weeks. Accidentally took the shots twice in a week due to forgetfulness or mistake but did not notice any major problems. She called in on account of increased joint pain but felt no improvement with the prescribed medrol  dose pack. She did feel more lightheadedness or off balance feeling while taking this so stopped after about 3 days. Currently back pain and her left foot pain are worst issues.   Previous HPI 12/05/2021 CORLEEN OTWELL is a 68 y.o. female here for evaluation of rheumatoid arthritis with chronic joint pain in multiple areas and chronic fatigue labs showing elevated rheumatoid factor. She has history of mitral valve prolapse, severe osteoporosis, osteoarthritis, and severe weight loss and deconditioning. She was diagnosed with seropositive RA in 2018 with Dr. Curt and most recently saw Dr. Mai in 11/2020. At that time recommended to start hydroxychloroquine  for inflammatory arthritis symptoms. She previously tried methotrexate, simponi  aria, and orencia  with poor tolerance. Imaging consistent with degenerative arthritis in cervical spine and knee but also with some partial tendon tear in shoulder and some synovitis present in knee on MRI imaging. She has taken multiple medications for pain including NSAIDs, prednisone , gabapentin , tramadol , and hydrocodone  or oxycodone . She has a lot of pain in multiple areas but is also severely fatigued. She has significant weight loss down to 70 lbs unintentionally reports chronic IBS issues but no recent changes during this period of weight loss. Joint pain also limits her activity quite a bit with chronic  deformities and also ongoing swelling in bilateral hands. She has also developed left sided headache localized above the temporal area. She has a lot of pain at the base of the skull and occiput without clear radiation of symptoms. She is scheduled to see Dr. Rush for headache evaluation and management 3/28.   DMARD Hx AZA LEF HCQ SSZ MTX Simponi  Orencia  Kevzara    05/2017 RF 15 CCP 32 HBV neg HCV neg   No Rheumatology ROS completed.   PMFS History:  Patient Active Problem List   Diagnosis Date Noted   Chronic gastritis without bleeding 08/01/2023   Generalized anxiety  disorder 08/01/2023   Redundant colon 08/01/2023   Mixed hyperlipidemia 08/01/2023   High risk medication use 01/16/2023   History of CVA (cerebrovascular accident) 11/14/2022   Insomnia due to medical condition 03/15/2022   Degeneration of intervertebral disc of cervical region 12/28/2021   Medication monitoring encounter 12/05/2021   Physical deconditioning 11/11/2021   Elevated serum lactate dehydrogenase (LDH) 08/17/2021   Vitamin D  deficiency 08/17/2021   Chronic fatigue 07/25/2021   Vitamin B12 deficiency 07/19/2021   Hardening of the aorta (main artery of the heart) (HCC) 06/21/2021   Solitary lung nodule 06/15/2021   Loss of weight 05/18/2021   Protein-calorie malnutrition, severe (HCC) 05/18/2021   Osteoporosis 06/15/2020   Iron  deficiency anemia 04/09/2020   Primary osteoarthritis involving multiple joints 12/31/2018   Rheumatoid arthritis involving multiple sites with positive rheumatoid factor (HCC) 12/31/2018   Irritable bowel syndrome with both constipation and diarrhea 10/28/2018   Constipation 03/14/2016   Mitral valve prolapse 11/23/2015   High cholesterol 11/23/2015    Past Medical History:  Diagnosis Date   Anemia    Anxiety    Arthritis    RA   Bursitis    Cataract    CHF (congestive heart failure) (HCC)    Collagen vascular disease (HCC)    Depression    Endometriosis     Heart murmur    High cholesterol    Hypertension    IBS (irritable bowel syndrome)    Lymphadenopathy    Mitral valve prolapse    Osteoporosis    RA (rheumatoid arthritis) (HCC)    Rotator cuff tear    bilateral   Stroke (HCC) 06/2022   Uterus, adenomyosis     Family History  Problem Relation Age of Onset   Bone cancer Mother    Brain cancer Father    Brain cancer Sister    Breast cancer Sister    Renal Disease Sister    Pancreatic cancer Neg Hx    Esophageal cancer Neg Hx    Stomach cancer Neg Hx    Rectal cancer Neg Hx    Colon polyps Neg Hx    Past Surgical History:  Procedure Laterality Date   AXILLARY LYMPH NODE BIOPSY Right 10/12/2020   Procedure: RIGHT AXILLARY LYMPH NODE BIOPSY EXCISION;  Surgeon: Aron Shoulders, MD;  Location: [facility];  Service: General;  Laterality: Right;   BUNIONECTOMY Right    CARPAL TUNNEL RELEASE Right    COLONOSCOPY     COLONOSCOPY N/A 12/13/2015   Procedure: COLONOSCOPY;  Surgeon: Claudis RAYMOND Rivet, MD;  [facility]: [facility];  Service: Endoscopy;  Laterality: N/A;  9:55   Fatty tumor     2011 (left arm)   TONSILLECTOMY     TOTAL ABDOMINAL HYSTERECTOMY     2003   Social History   Social History Narrative   Not on file   Immunization History  Administered Date(s) Administered   Influenza, Quadrivalent, Recombinant, Inj, Pf 07/11/2017     Objective: Vital Signs: There were no vitals taken for this visit.   Physical Exam   Musculoskeletal Exam: ***  CDAI Exam: CDAI Score: -- Patient Global: --; Provider Global: -- Swollen: --; Tender: -- Joint Exam 04/07/2024   No joint exam has been documented for this visit   There is currently no information documented on the homunculus. Go to the Rheumatology activity and complete the homunculus joint exam.  Investigation: No additional findings.  Imaging: XR Knee 1-2 Views Left Result Date: 03/02/2024 AP of  right and left knees demonstrate left knee with  slightly decreased medial joint space compared with the right knee.  No end-stage knee arthritis.  No fracture or dislocation.  XR Shoulder Right Result Date: 03/02/2024 AP, Scap Y, axillary views of the right shoulder reviewed.  No fracture or dislocation.  No significant degenerative changes.  US  Guided Needle Placement - No Linked Charges Result Date: 03/02/2024 Ultrasound imaging demonstrates needle placement into the right first [facility] joint with successful injection.  No complication.  Subsequent injection with needle placement into the bicipital groove also successful without complication.   Recent Labs: Lab Results  Component Value Date   WBC 9.3 03/18/2024   HGB 10.2 (L) 03/18/2024   PLT 413 (H) 03/18/2024   NA 136 03/18/2024   K 3.7 03/18/2024   CL 100 03/18/2024   CO2 26 03/18/2024   GLUCOSE 123 (H) 03/18/2024   BUN 10 03/18/2024   CREATININE 0.67 03/18/2024   BILITOT 0.3 03/18/2024   ALKPHOS 132 (H) 03/18/2024   AST 17 03/18/2024   ALT 9 03/18/2024   PROT 7.0 03/18/2024   ALBUMIN 3.1 (L) 03/18/2024   CALCIUM  8.7 (L) 03/18/2024   GFRAA 92 11/06/2019   QFTBGOLDPLUS NEGATIVE 12/05/2021    Speciality Comments: No specialty comments available.  Procedures:  No procedures performed Allergies: Abatacept , Codeine , Golimumab , Hydrocodone , Methylprednisolone , Methotrexate derivatives, and Prednisone    Assessment / Plan:     Visit Diagnoses: No diagnosis found.  ***  Orders: No orders of the defined types were placed in this encounter.  No orders of the defined types were placed in this encounter.    Follow-Up Instructions: No follow-ups on file.   Shelba SHAUNNA Potters, RT  Note - This record has been created using [employer].  Chart creation errors have been sought, but may not always  have been located. Such creation errors do not reflect on  the standard of medical care.

## 2024-03-24 NOTE — Progress Notes (Signed)
 Vanessa Lara - 68 y.o. female MRN [id number]  Date of birth: 12-31-1955  Office Visit Note: Visit Date: 03/24/2024 PCP: Suanne Pfeiffer, NP Referred by: Suanne Pfeiffer, NP  Subjective: Chief Complaint  Patient presents with   Left Knee - Follow-up   Neck - Follow-up   HPI: Vanessa Lara is a pleasant 68 y.o. female who presents today for follow-up of quadriceps pain, here for b/l hand pain and neck pain.  At last visit we did do extracorporeal shockwave therapy of her quadricep and proximal knee which she states has improved quite a bit.  However, her whole body is in quite a bit of pain and feels like it is locking up.  She has pain in her hands with notable contractures.  Her neck continues to be painful and stiff as well.  She is continuing her home exercises for the cervical spine.  She did take her tizanidine  which helped somewhat with her symptoms.  Pertinent ROS were reviewed with the patient and found to be negative unless otherwise specified above in HPI.   Assessment & Plan: Visit Diagnoses:  1. Bilateral hand pain   2. CMC arthritis   3. Spondylosis of cervical region without myelopathy or radiculopathy   4. Rheumatoid arthritis involving multiple sites with positive rheumatoid factor (HCC)    Plan: Impression is full body chronic pain syndrome which is most notable in her bilateral hands with advanced CMC joint arthritis as well as neck pain with cervical spondylosis.  She did have tendinitis of the left quadricep which responded very well to extracorporeal shockwave therapy.  She would like to try this for the hands today.  Did discuss this is not a common pathology we treat but she was willing to give it a try today.  We did perform this to the palmar aspect of the hands and her contractures.  In terms of her neck pain and spondylosis, she will continue her cervical isometrics.  She may use her tizanidine  4 mg every 6 hours as needed.  Okay for Tylenol  as well and she  does have fentanyl  50 mics per hour patch through hospice team care that she may use for her breakthrough pain.  She was asking today regarding rheumatologic medications, she will keep her follow-up with Dr. Jeannetta and recommend she discuss this with him.  Follow-up: Return for make appt with [facility] in 3 weeks for shoulders.   Meds & Orders: No orders of the defined types were placed in this encounter.  No orders of the defined types were placed in this encounter.    Procedures: Procedure: ECSWT Indications:  Palmar contractures, hand pain, CMC   Procedure Details Consent: Risks of procedure as well as the alternatives and risks of each were explained to the patient.  Verbal consent for procedure obtained. Time Out: Verified patient identification, verified procedure, site was marked, verified correct patient position. The area was cleaned with alcohol swab.     The right and left palmar hands was targeted for Extracorporeal shockwave therapy.    Preset: Hand OA/Soft tissue Power Level: 50-60 Frequency: 8 Hz Impulse/cycles: 1000 each hand Head size: Regular   Patient tolerated procedure well without immediate complications.       Clinical History:   She reports that she has been smoking cigarettes. She started smoking about 45 years ago. She has a 16.5 pack-year smoking history. She has never used smokeless tobacco. No results for input(s): HGBA1C, LABURIC in the last 8760 hours.  Objective:    Physical Exam  Gen: Well-appearing, in no acute distress; non-toxic CV: Well-perfused. Warm.  Resp: Breathing unlabored on room air; no wheezing. Psych: Fluid speech in conversation; appropriate affect; normal thought process  Ortho Exam - Bilateral hands: There is notable bilateral CMC arthritic change with bossing on the right greater than left hand.  She has palmar contractions with flexion deformity of her 2nd through 4th fingers bilaterally.  - Neck: There is limited  range of motion in all directions.  There is cervical paraspinal hypertonicity with forward nutation of the cervical spine.  Imaging: No results found.  Past Medical/Family/Surgical/Social History: Medications & Allergies reviewed per EMR, new medications updated. Patient Active Problem List   Diagnosis Date Noted   Chronic gastritis without bleeding 08/01/2023   Generalized anxiety disorder 08/01/2023   Redundant colon 08/01/2023   Mixed hyperlipidemia 08/01/2023   High risk medication use 01/16/2023   History of CVA (cerebrovascular accident) 11/14/2022   Insomnia due to medical condition 03/15/2022   Degeneration of intervertebral disc of cervical region 12/28/2021   Medication monitoring encounter 12/05/2021   Physical deconditioning 11/11/2021   Elevated serum lactate dehydrogenase (LDH) 08/17/2021   Vitamin D  deficiency 08/17/2021   Chronic fatigue 07/25/2021   Vitamin B12 deficiency 07/19/2021   Hardening of the aorta (main artery of the heart) (HCC) 06/21/2021   Solitary lung nodule 06/15/2021   Loss of weight 05/18/2021   Protein-calorie malnutrition, severe (HCC) 05/18/2021   Osteoporosis 06/15/2020   Iron  deficiency anemia 04/09/2020   Primary osteoarthritis involving multiple joints 12/31/2018   Rheumatoid arthritis involving multiple sites with positive rheumatoid factor (HCC) 12/31/2018   Irritable bowel syndrome with both constipation and diarrhea 10/28/2018   Constipation 03/14/2016   Mitral valve prolapse 11/23/2015   High cholesterol 11/23/2015   Past Medical History:  Diagnosis Date   Anemia    Anxiety    Arthritis    RA   Bursitis    Cataract    CHF (congestive heart failure) (HCC)    Collagen vascular disease (HCC)    Depression    Endometriosis    Heart murmur    High cholesterol    Hypertension    IBS (irritable bowel syndrome)    Lymphadenopathy    Mitral valve prolapse    Osteoporosis    RA (rheumatoid arthritis) (HCC)    Rotator cuff  tear    bilateral   Stroke (HCC) 06/2022   Uterus, adenomyosis    Family History  Problem Relation Age of Onset   Bone cancer Mother    Brain cancer Father    Brain cancer Sister    Breast cancer Sister    Renal Disease Sister    Pancreatic cancer Neg Hx    Esophageal cancer Neg Hx    Stomach cancer Neg Hx    Rectal cancer Neg Hx    Colon polyps Neg Hx    Past Surgical History:  Procedure Laterality Date   AXILLARY LYMPH NODE BIOPSY Right 10/12/2020   Procedure: RIGHT AXILLARY LYMPH NODE BIOPSY EXCISION;  Surgeon: Aron Shoulders, MD;  [facility];  Service: General;  Laterality: Right;   BUNIONECTOMY Right    CARPAL TUNNEL RELEASE Right    COLONOSCOPY     COLONOSCOPY N/A 12/13/2015   Procedure: COLONOSCOPY;  Surgeon: Claudis RAYMOND Rivet, MD;  [facility]: [facility];  Service: Endoscopy;  Laterality: N/A;  9:55   Fatty tumor     2011 (left arm)   TONSILLECTOMY  TOTAL ABDOMINAL HYSTERECTOMY     2003   Social History   Occupational History   Occupation: retired  Tobacco Use   Smoking status: Some Days    Current packs/day: 0.00    Average packs/day: 0.5 packs/day for 33.0 years (16.5 ttl pk-yrs)    Types: Cigarettes    Start date: 60    Last attempt to quit: 2013    Years since quitting: 12.4   Smokeless tobacco: Never  Vaping Use   Vaping status: Never Used  Substance and Sexual Activity   Alcohol use: No    Alcohol/week: 0.0 standard drinks of alcohol   Drug use: No   Sexual activity: Not Currently

## 2024-03-25 ENCOUNTER — Encounter: Payer: Self-pay | Admitting: Physician Assistant

## 2024-03-25 ENCOUNTER — Encounter: Payer: Self-pay | Admitting: Hematology

## 2024-03-25 ENCOUNTER — Other Ambulatory Visit: Payer: Self-pay

## 2024-03-26 ENCOUNTER — Telehealth: Payer: Self-pay | Admitting: *Deleted

## 2024-03-26 ENCOUNTER — Telehealth: Payer: Self-pay

## 2024-03-26 NOTE — Telephone Encounter (Signed)
 Auth Submission: APPROVED Site of care: Site of care: [facility] Payer: [employer]  medicare Medication & CPT/J Code(s) submitted: Feraheme  (ferumoxytol ) [id number] Diagnosis Code:  Route of submission (phone, fax, portal): portal Phone # Fax # Auth type: Buy/Bill PB Units/visits requested: 510mg , x 2doses Reference number: [id number] Approval from: 03/25/24 to 10/01/24

## 2024-03-26 NOTE — Telephone Encounter (Signed)
 Patient contacted the office and left message requesting a sooner appointment.   Returned call to patient. Patient states she would like a sooner appointment to  discuss medications. Patient advised at this time we do not have a sooner appointment. Patient advised if we get a cancellation. Patient expressed understanding.

## 2024-03-27 ENCOUNTER — Ambulatory Visit: Admitting: Sports Medicine

## 2024-03-30 NOTE — Progress Notes (Deleted)
 Vanessa Lara, female    DOB: 25-Mar-1956    MRN: [id number]   Brief patient profile:  ***  yo*** *** referred to pulmonary clinic in [place]  04/01/2024 by *** for ***   Pt not previously seen by PCCM service.    History of Present Illness  04/01/2024  Pulmonary/ 1st office eval/ Vanessa Lara / [facility]  No chief complaint on file.    Dyspnea:  *** Cough: *** Sleep: *** SABA use: *** 02: *** LDSCT:***  No obvious day to day or daytime pattern/variability or assoc excess/ purulent sputum or mucus plugs or hemoptysis or cp or chest tightness, subjective wheeze or overt sinus or hb symptoms.    Also denies any obvious fluctuation of symptoms with weather or environmental changes or other aggravating or alleviating factors except as outlined above   No unusual exposure hx or h/o childhood pna/ asthma or knowledge of premature birth.  Current Allergies, Complete Past Medical History, Past Surgical History, Family History, and Social History were reviewed in [name] record.  ROS  The following are not active complaints unless bolded Hoarseness, sore throat, dysphagia, dental problems, itching, sneezing,  nasal congestion or discharge of excess mucus or purulent secretions, ear ache,   fever, chills, sweats, unintended wt loss or wt gain, classically pleuritic or exertional cp,  orthopnea pnd or arm/hand swelling  or leg swelling, presyncope, palpitations, abdominal pain, anorexia, nausea, vomiting, diarrhea  or change in bowel habits or change in bladder habits, change in stools or change in urine, dysuria, hematuria,  rash, arthralgias, visual complaints, headache, numbness, weakness or ataxia or problems with walking or coordination,  change in mood or  memory.            Outpatient Medications Prior to Visit  Medication Sig Dispense Refill   acetaminophen  (TYLENOL ) 500 MG tablet Take 500 mg by mouth every 6 (six) hours as needed.     ALPRAZolam  (XANAX) 0.25 MG tablet Take 0.25 mg by mouth 2 (two) times daily as needed.     Ascorbic Acid (VITAMIN C PO) Take by mouth.     ASPIRIN 81 PO Take 1 tablet by mouth daily.     atorvastatin  (LIPITOR) 10 MG tablet Take by mouth.     Cholecalciferol (VITAMIN D3) 25 MCG (1000 UT) CAPS 1 capsule Orally Once a day for 30 day(s)     diclofenac  Sodium (VOLTAREN ) 1 % GEL SMARTSIG:Gram(s) Topical 4 Times Daily PRN     docusate sodium  (COLACE) 100 MG capsule TAKE 1 CAPSULE(100 MG) BY MOUTH TWICE DAILY 60 capsule 2   fentaNYL  (DURAGESIC ) 50 MCG/HR Place onto the skin every 3 (three) days. Patient states she does it every other day.     folic acid  (FOLVITE ) 1 MG tablet Take 1 mg by mouth daily.     furosemide  (LASIX ) 20 MG tablet Take 20 mg by mouth daily as needed.     Heat Wraps (THERMACARE BACK/HIP) MISC 1 application by Does not apply route daily. 3 each 11   hydroxychloroquine  (PLAQUENIL ) 200 MG tablet Take daily 5 days out of the week 25 tablet 2   hydrOXYzine (ATARAX) 25 MG tablet Take 25 mg by mouth 2 (two) times daily.     lidocaine  (LIDODERM ) 5 % one patch daily.     MYRBETRIQ 50 MG TB24 tablet Take 50 mg by mouth daily.     naloxone (NARCAN) nasal spray 4 mg/0.1 mL      ondansetron  (ZOFRAN -ODT) 4  MG disintegrating tablet Take 4 mg by mouth every 8 (eight) hours as needed.     pantoprazole  (PROTONIX ) 40 MG tablet TAKE 1 TABLET(40 MG) BY MOUTH TWICE DAILY 180 tablet 3   PARoxetine  (PAXIL ) 20 MG tablet Take 20 mg by mouth daily.     sucralfate (CARAFATE) 1 GM/10ML suspension SHAKE LIQUID AND TAKE 10 ML(1 GRAM) BY MOUTH FOUR TIMES DAILY     tiZANidine  (ZANAFLEX ) 4 MG tablet TAKE 1 TABLET(4 MG) BY MOUTH EVERY 8 HOURS AS NEEDED FOR MUSCLE SPASMS 30 tablet 1   traZODone  (DESYREL ) 100 MG tablet Take 1 tablet (100 mg total) by mouth at bedtime as needed for sleep. 30 tablet 6   Zoledronic  Acid (RECLAST  IV) Inject 5 mg into the vein. Once yearly     No facility-administered medications prior to visit.     Past Medical History:  Diagnosis Date   Anemia    Anxiety    Arthritis    RA   Bursitis    Cataract    CHF (congestive heart failure) (HCC)    Collagen vascular disease (HCC)    Depression    Endometriosis    Heart murmur    High cholesterol    Hypertension    IBS (irritable bowel syndrome)    Lymphadenopathy    Mitral valve prolapse    Osteoporosis    RA (rheumatoid arthritis) (HCC)    Rotator cuff tear    bilateral   Stroke (HCC) 06/2022   Uterus, adenomyosis       Objective:     There were no vitals taken for this visit.         Assessment   No problem-specific Assessment & Plan notes found for this encounter.     Vanessa America, MD 03/30/2024

## 2024-04-01 ENCOUNTER — Encounter: Payer: Self-pay | Admitting: Internal Medicine

## 2024-04-01 ENCOUNTER — Ambulatory Visit: Admitting: Internal Medicine

## 2024-04-03 ENCOUNTER — Encounter: Attending: Gastroenterology | Admitting: *Deleted

## 2024-04-03 VITALS — BP 74/61 | HR 77 | Temp 97.8°F | Resp 16

## 2024-04-03 DIAGNOSIS — D500 Iron deficiency anemia secondary to blood loss (chronic): Secondary | ICD-10-CM | POA: Insufficient documentation

## 2024-04-03 DIAGNOSIS — D509 Iron deficiency anemia, unspecified: Secondary | ICD-10-CM | POA: Diagnosis not present

## 2024-04-03 MED ORDER — SODIUM CHLORIDE 0.9 % IV SOLN
510.0000 mg | Freq: Once | INTRAVENOUS | Status: AC
  Administered 2024-04-03: 510 mg via INTRAVENOUS
  Filled 2024-04-03: qty 17

## 2024-04-03 NOTE — Progress Notes (Signed)
 Diagnosis: Iron  Deficiency Anemia  Provider:  Pleasant Barefoot, PA-C  Procedure: IV Infusion  IV Type: Peripheral, IV Location: R Antecubital  Feraheme  (Ferumoxytol ), Dose: 510 mg  Infusion Start Time: 1235  Infusion Stop Time: 1250  Post Infusion IV Care: Observation period completed  Discharge: Condition: Good, Destination: Home . AVS Provided  Performed by:  Baldwin Darice Helling, RN

## 2024-04-03 NOTE — Patient Instructions (Signed)

## 2024-04-07 ENCOUNTER — Ambulatory Visit: Admitting: Internal Medicine

## 2024-04-07 DIAGNOSIS — R5381 Other malaise: Secondary | ICD-10-CM

## 2024-04-07 DIAGNOSIS — E43 Unspecified severe protein-calorie malnutrition: Secondary | ICD-10-CM

## 2024-04-07 DIAGNOSIS — M0579 Rheumatoid arthritis with rheumatoid factor of multiple sites without organ or systems involvement: Secondary | ICD-10-CM

## 2024-04-07 DIAGNOSIS — M150 Primary generalized (osteo)arthritis: Secondary | ICD-10-CM

## 2024-04-07 DIAGNOSIS — Z79899 Other long term (current) drug therapy: Secondary | ICD-10-CM

## 2024-04-07 DIAGNOSIS — M810 Age-related osteoporosis without current pathological fracture: Secondary | ICD-10-CM

## 2024-04-07 DIAGNOSIS — D509 Iron deficiency anemia, unspecified: Secondary | ICD-10-CM

## 2024-04-07 DIAGNOSIS — E559 Vitamin D deficiency, unspecified: Secondary | ICD-10-CM

## 2024-04-08 ENCOUNTER — Ambulatory Visit: Admitting: Sports Medicine

## 2024-04-16 ENCOUNTER — Other Ambulatory Visit (INDEPENDENT_AMBULATORY_CARE_PROVIDER_SITE_OTHER)

## 2024-04-16 ENCOUNTER — Ambulatory Visit (INDEPENDENT_AMBULATORY_CARE_PROVIDER_SITE_OTHER): Admitting: Surgical

## 2024-04-16 ENCOUNTER — Encounter: Payer: Self-pay | Admitting: Surgical

## 2024-04-16 ENCOUNTER — Telehealth: Payer: Self-pay

## 2024-04-16 DIAGNOSIS — M545 Low back pain, unspecified: Secondary | ICD-10-CM | POA: Diagnosis not present

## 2024-04-16 DIAGNOSIS — M25511 Pain in right shoulder: Secondary | ICD-10-CM

## 2024-04-16 DIAGNOSIS — Z8739 Personal history of other diseases of the musculoskeletal system and connective tissue: Secondary | ICD-10-CM

## 2024-04-16 DIAGNOSIS — M25819 Other specified joint disorders, unspecified shoulder: Secondary | ICD-10-CM

## 2024-04-16 DIAGNOSIS — M25512 Pain in left shoulder: Secondary | ICD-10-CM | POA: Diagnosis not present

## 2024-04-16 MED ORDER — TRIAMCINOLONE ACETONIDE 40 MG/ML IJ SUSP
40.0000 mg | INTRAMUSCULAR | Status: AC | PRN
  Administered 2024-04-16: 40 mg via INTRA_ARTICULAR

## 2024-04-16 MED ORDER — BUPIVACAINE HCL 0.25 % IJ SOLN
4.0000 mL | INTRAMUSCULAR | Status: AC | PRN
  Administered 2024-04-16: 4 mL via INTRA_ARTICULAR

## 2024-04-16 NOTE — Progress Notes (Signed)
 Office Visit Note   Patient: Vanessa Lara           Date of Birth: November 19, 1955           MRN: [id number] Visit Date: 04/16/2024 Requested by: Suanne Pfeiffer, NP [address] [place],  [place] [address] PCP: Suanne Pfeiffer, NP  Subjective: Chief Complaint  Patient presents with   Right Shoulder - Pain   Left Shoulder - Pain    HPI: Vanessa Lara is a 68 y.o. female who presents to the office reporting bilateral shoulders pain and low back pain.  She did have relief of right shoulder pain from bicipital groove injection at her last visit with myself.  That lasted for several weeks before her pain has recurred.  She also has ongoing left shoulder pain and has had good relief with prior shoulder injections in the past.  No new fall or injury.  No new weakness of the shoulder.  Does have some mechanical clicking sensation in the left shoulder but not the right.  No numbness or tingling.  She also complains of low back pain over the last 2 to 3 weeks without history of injury.  She describes focal low back pain with some buttock pain in the sacral region as well.  There is no radiation of pain or numbness/tingling.  No red flag signs or symptoms such as bowel/bladder incontinence or saddle anesthesia.  Pain is made worse with putting a lot of pressure on her butt or with laying on her back.  It is better when she lays on her side but then her's shoulders hurt..                ROS: All systems reviewed are negative as they relate to the chief complaint within the history of present illness.  Patient denies fevers or chills.  Assessment & Plan: Visit Diagnoses:  1. Acute midline low back pain without sciatica   2. Shoulder impingement     Plan: We discussed options available to patient.  She has radiographs taken today of her lumbar spine demonstrating no significant evidence of compression fracture of any of the vertebra.  She is not really able to reliably leave her house to  pursue physical therapy.  After discussion, patient would like to hold off on any intervention and we will try activity modification to see if this pain will resolve.  If no improvement in the next 1 to 2 weeks, next step would be MRI scan of the lumbar spine for further evaluation of occult compression fracture versus insufficiency fracture of the superior aspect of the sacrum.  Regarding her shoulder pain, she has had good relief with cortisone injections in the past.  She has good rotator cuff strength and her pain is most reproduced with impingement maneuvers today.  With lack of ultrasound available onsite, plan to try subacromial injection in both shoulders.  She tolerated these injections well and did have great symptomatic relief near 100% after administration.  Plan to follow-up in 2 to 3 weeks for clinical recheck if she is having continued pain but if she is feeling better at that point, she may cancel her appointment.  Follow-Up Instructions: No follow-ups on file.   Orders:  Orders Placed This Encounter  Procedures   XR Lumbar Spine 2-3 Views   No orders of the defined types were placed in this encounter.     Procedures: Large Joint Inj: bilateral subacromial bursa on 04/16/2024  12:25 PM Indications: diagnostic evaluation and pain Details: 25 G 1.5 in needle, posterior approach  Arthrogram: No  Medications (Right): 4 mL bupivacaine  0.25 %; 40 mg triamcinolone  acetonide 40 MG/ML Medications (Left): 4 mL bupivacaine  0.25 %; 40 mg triamcinolone  acetonide 40 MG/ML Outcome: tolerated well, no immediate complications Procedure, treatment alternatives, risks and benefits explained, specific risks discussed. Consent was given by the patient. Immediately prior to procedure a time out was called to verify the correct patient, procedure, equipment, support staff and site/side marked as required. Patient was prepped and draped in the usual sterile fashion.       Clinical Data: No  additional findings.  Objective: Vital Signs: There were no vitals taken for this visit.  Physical Exam:  Constitutional: Patient appears well-developed HEENT:  Head: Normocephalic Eyes:EOM are normal Neck: Normal range of motion Cardiovascular: Normal rate Pulmonary/chest: Effort normal Neurologic: Patient is alert Skin: Skin is warm Psychiatric: Patient has normal mood and affect  Ortho Exam: Ortho exam demonstrates focal tenderness along the lumbar spine primarily around L4 and L5.  Also has some mild tenderness over the sacral region diffusely.  There is no erythema or focal swelling that is appreciable.  She has no tenderness along the iliac crest bilaterally.  Able to flex and extend her spine with minimal discomfort but does have some mild discomfort with terminal spine extension.  Intact hip flexion, quadricep, hamstring, dorsiflexion, plantarflexion.  She has left shoulder with range of motion demonstrating 90 degrees X rotation, 90 degrees abduction, 140 degrees forward elevation passively and actively.  This compared with the right shoulder with 90 degrees X rotation, 90 degrees abduction, 160 degrees forward elevation passively and actively.  She has excellent rotator cuff strength of both shoulders with intact supra, infra, subscap strength.  She has palpable radial pulses bilateral extremities.  No Popeye deformity noted in either shoulder.  She does have positive Neer and Hawkins impingement signs bilaterally.  There is some crepitus noted primarily in the anterior aspect of the left shoulder with no such crepitus in the right shoulder.  Specialty Comments:  MRI CERVICAL SPINE WITHOUT CONTRAST   TECHNIQUE: Multiplanar, multisequence MR imaging of the cervical spine was performed. No intravenous contrast was administered.   COMPARISON:  None.   FINDINGS: Alignment: 2 mm retrolisthesis of C5 on C6.   Vertebrae: No acute fracture, evidence of discitis, or bone lesion.    Cord: Normal signal and morphology.   Posterior Fossa, vertebral arteries, paraspinal tissues: Posterior fossa demonstrates no focal abnormality. Vertebral artery flow voids are maintained. Paraspinal soft tissues are unremarkable.   Disc levels:   Discs: Degenerative disease with disc height loss at C4-5 and C5-6.   C2-3: No significant disc bulge. No neural foraminal stenosis. No central canal stenosis.   C3-4: Mild broad-based disc bulge. No foraminal or central canal stenosis.   C4-5: Mild broad-based disc bulge. No foraminal or central canal stenosis.   C5-6: Mild broad-based disc bulge. Bilateral uncovertebral degenerative changes. Moderate left and severe right foraminal stenosis. No spinal stenosis.   C6-7: Mild broad-based disc bulge. No foraminal or central canal stenosis.   C7-T1: No significant disc bulge. No neural foraminal stenosis. No central canal stenosis.   IMPRESSION: 1. At C5-6 there is a mild broad-based disc bulge. Bilateral uncovertebral degenerative changes. Moderate left and severe right foraminal stenosis. 2. Otherwise, mild cervical spine spondylosis as described above.     Electronically Signed   By: Julaine Blanch M.D.   On: 12/16/2021  10:51  Imaging: No results found.   PMFS History: Patient Active Problem List   Diagnosis Date Noted   Chronic gastritis without bleeding 08/01/2023   Generalized anxiety disorder 08/01/2023   Redundant colon 08/01/2023   Mixed hyperlipidemia 08/01/2023   High risk medication use 01/16/2023   History of CVA (cerebrovascular accident) 11/14/2022   Insomnia due to medical condition 03/15/2022   Degeneration of intervertebral disc of cervical region 12/28/2021   Medication monitoring encounter 12/05/2021   Physical deconditioning 11/11/2021   Elevated serum lactate dehydrogenase (LDH) 08/17/2021   Vitamin D  deficiency 08/17/2021   Chronic fatigue 07/25/2021   Vitamin B12 deficiency 07/19/2021    Hardening of the aorta (main artery of the heart) (HCC) 06/21/2021   Solitary lung nodule 06/15/2021   Loss of weight 05/18/2021   Protein-calorie malnutrition, severe (HCC) 05/18/2021   Osteoporosis 06/15/2020   Iron  deficiency anemia 04/09/2020   Primary osteoarthritis involving multiple joints 12/31/2018   Rheumatoid arthritis involving multiple sites with positive rheumatoid factor (HCC) 12/31/2018   Irritable bowel syndrome with both constipation and diarrhea 10/28/2018   Constipation 03/14/2016   Mitral valve prolapse 11/23/2015   High cholesterol 11/23/2015   Past Medical History:  Diagnosis Date   Anemia    Anxiety    Arthritis    RA   Bursitis    Cataract    CHF (congestive heart failure) (HCC)    Collagen vascular disease (HCC)    Depression    Endometriosis    Heart murmur    High cholesterol    Hypertension    IBS (irritable bowel syndrome)    Lymphadenopathy    Mitral valve prolapse    Osteoporosis    RA (rheumatoid arthritis) (HCC)    Rotator cuff tear    bilateral   Stroke (HCC) 06/2022   Uterus, adenomyosis     Family History  Problem Relation Age of Onset   Bone cancer Mother    Brain cancer Father    Brain cancer Sister    Breast cancer Sister    Renal Disease Sister    Pancreatic cancer Neg Hx    Esophageal cancer Neg Hx    Stomach cancer Neg Hx    Rectal cancer Neg Hx    Colon polyps Neg Hx     Past Surgical History:  Procedure Laterality Date   AXILLARY LYMPH NODE BIOPSY Right 10/12/2020   Procedure: RIGHT AXILLARY LYMPH NODE BIOPSY EXCISION;  Surgeon: Aron Shoulders, MD;  Location: [facility];  Service: General;  Laterality: Right;   BUNIONECTOMY Right    CARPAL TUNNEL RELEASE Right    COLONOSCOPY     COLONOSCOPY N/A 12/13/2015   Procedure: COLONOSCOPY;  Surgeon: Claudis RAYMOND Rivet, MD;  Location: [facility];  Service: Endoscopy;  Laterality: N/A;  9:55   Fatty tumor     2011 (left arm)   TONSILLECTOMY     TOTAL  ABDOMINAL HYSTERECTOMY     2003   Social History   Occupational History   Occupation: retired  Tobacco Use   Smoking status: Some Days    Current packs/day: 0.00    Average packs/day: 0.5 packs/day for 33.0 years (16.5 ttl pk-yrs)    Types: Cigarettes    Start date: 38    Last attempt to quit: 2013    Years since quitting: 12.5   Smokeless tobacco: Never  Vaping Use   Vaping status: Never Used  Substance and Sexual Activity   Alcohol use: No  Alcohol/week: 0.0 standard drinks of alcohol   Drug use: No   Sexual activity: Not Currently

## 2024-04-16 NOTE — Telephone Encounter (Signed)
 Patient called the office to see if there was a sooner appointment, offered 04/22/2024 at 3:20 Patient stated she could not do that day as she has other appointments that day at similar times. Patient would like to be contacted if something opens up on a Friday, states her RA has gotten out of control an she needs some type of medication.

## 2024-04-16 NOTE — Addendum Note (Signed)
 Addended by: SHIRLY CARLIN CROME on: 04/16/2024 01:10 PM   Modules accepted: Orders

## 2024-04-22 ENCOUNTER — Inpatient Hospital Stay: Attending: Hematology

## 2024-04-22 ENCOUNTER — Ambulatory Visit: Admitting: Sports Medicine

## 2024-04-22 NOTE — Progress Notes (Deleted)
 Office Visit Note  Patient: Vanessa Lara             Date of Birth: [birth year]           MRN: [id number]             PCP: Suanne Pfeiffer, NP Referring: Suanne Pfeiffer, NP Visit Date: 04/29/2024   Subjective:  No chief complaint on file.   History of Present Illness: Vanessa Lara is a 68 y.o. female here for follow up with rheumatoid arthritis on HCQ 200 mg daily x5 days weekly who presents with severe pain and medication intolerance.    Previous HPI 01/07/2024 Vanessa Lara is a 68 year old female with rheumatoid arthritis on HCQ 200 mg daily x5 days weekly who presents with severe pain and medication intolerance.   She experiences severe pain due to arthritis, exacerbated by muscle spasms in her back. The pain is intense and significantly impacts her quality of life, leaving her feeling 'miserable' and without a 'happy place'. Flare-ups occur every other day or every third day, preventing her from performing daily activities.   She has a history of intolerance to several medications. Azathioprine  caused severe gastrointestinal issues, and she is currently on hydroxychloroquine  for arthritis. She cannot take codeine  due to gastrointestinal side effects related to her IBS. Muscle relaxants like Flexeril  have been ineffective in managing her symptoms.   A recent bone marrow study was performed without anesthesia due to low blood pressure, which she found distressing. She experiences dizziness throughout the day, which she suspects may be related to anemia or other underlying issues. She has gained a small amount of weight recently, about four ounces.   She has been unable to follow up with an orthopedist due to her condition flaring up, making it difficult for her to travel. She also struggles with arranging transportation as her plans often fall through at the last minute.      Previous HPI 11/19/23 Vanessa Lara is a 68 year old female with rheumatoid arthritis on  hydroxychloroquine  200 mg once daily for 5 days per week.   She experiences significant pain and stiffness, particularly on Sundays, leading her to take an additional half dose of her medication on Sundays for the past two weekends. She describes waking up with severe stiffness and being unable to move by Monday if she does not take the extra dose. She is seeking an alternative medication to manage her symptoms more effectively.   She describes pain associated with bursitis and possible rotator cuff issues, noting a pressure point that radiates from her shoulder down towards her armpit and into her upper arm, forming a 'V' shape. She is scheduled to receive an injection for this pain.   She reports issues with her foot, including a previously twisted or sprained ankle that healed but then became painful and red, particularly around a bone that feels bruised. She also mentions a rod in her big toe that has shifted, causing pain and misalignment of her toes. She was previously told that surgery could correct her toes, but now her arthritis and varicose veins have worsened, raising concerns about circulation, infection, and potential amputation. Her bone density is very low, complicating surgical options.   She mentions her knuckles are sore, with one being particularly bad, and she experiences pain that radiates down her ear. She also notes fluctuations in her weight, questioning whether it is due to scale differences or her condition.  Previous HPI 07/04/23 Vanessa Lara is a 68 y.o. female here for follow up for seropositive RA and osteoarthritis and osteoporosis she stopped hydroxychloroquine  after last visit due to concern about cardiac arrhythmia alongside Paxil  and duloxetine .  She was only able to take a low-dose of the medication she did feel it improved her overall condition but still had persistent arthritis of numerous areas regardless.  She had an updated bone density scan on July 15 still  with osteoporosis of the lumbar spine although better than 2022 and with femur osteopenia similar to the previous test.  She is taking Venofer  for iron  deficiency anemia this ongoing to correct it with her goal of enough to tolerate foot surgery.  She underwent endoscopy with no upper or lower GI findings to account for heme positive stool. Acute problem today increased right ankle pain and swelling started since 3 weeks ago.  She did not recall any fall trip or other trauma to that area.  Initially there was a high degree of swelling he put a wrap around this and can see prominent indentation the swelling has largely decreased compared to the outset but still has pain with direct pressure and with weightbearing.   Imaging reviewed DEXA Review [facility] Spine L1-L2 04/16/23  -3.8    0.712 g/cm2 04/12/21  -4.5    0.627 g/cm2 04/13/20  -4.2    0.665 g/cm2   DualFemur Total Mean 04/16/23  -2.6    0.680 g/cm2 04/12/21  -2.6    0.676 g/cm2 04/13/20  -2.2    0.736 g/cm2     03/08/23 Vanessa Lara is a 69 y.o. female here for follow up for seropositive RA.  Currently off any disease specific medication she cannot tolerate taking the sulfasalazine  due to severe stomach irritation described as burning up her stomach.  Continues having joint pain and stiffness on a daily basis overall feels her function is very poor due to the severity.  Had labs last month still anemic at hemoglobin of 9.8 being treated with infusion for iron  deficiency anemia.   01/16/23 Vanessa Lara is a 68 y.o. female here for follow up for seropositive RA.  Currently health maintenance treatment after discontinuing 5 mg prednisone  is on medication for joint pain for RA along with osteoarthritis and chronic pain syndrome.  Had recent knee steroid injection since her last visit but reports overall widespread symptoms are severe.  Particularly is been noticing some increased trouble with pain and some swelling involving the wrist worse on left  side.   10/03/22 Vanessa Lara is a 68 y.o. female here for follow up for seropositive RA currently off long term DMARD with prednisone  5 mg daily as needed.  She is overall not doing well feels like pain and stiffness is a big problem but more severe is her fatigue and feels a generalized getting weaker with difficulty moving and with her upper body strength.  She pretty consistently has to take the prednisone  for otherwise does not move around much during the day.  Continues to get a benefit with the Flexeril  at nighttime with slight decrease in frequency of awakenings.  She is also concerned about getting an update to her bone density testing ongoing oral alendronate  treatment.  She is also still having a lot of pain and difficulty with walking from her claw toe deformities on the foot and planning to follow-up with surgery clinic about management of this.   06/28/22 Vanessa Lara is a 68 y.o. female  here for follow up for seropositive RA now off of treatment after stopping the Kevzara .  Since she stopped taking the medication her complaint of night sweats and fatigue have improved. She has seen a worsening of joint pain in several areas particularly around the MCP joints of both hands. For knee pain she saw orthopedic surgery clinic yesterday and had injections on both sides.  She also discussed getting set up to do right SI joint steroid injection.  Biggest complaint today is back pain on the right side around the middle of the back and around the scapula.  This bothers her especially with certain movements and trying to bend side to side.    05/18/2022  Vanessa Lara is a 68 y.o. female here for follow up for seropositive rheumatoid arthritis on Kevzara  150 mg [place] q14days. She feels arthritis has been fairly well controlled but has pain worse in her left foot mostly. She had GI issues suspected UTI but with negative culture since last visit. She feels a sensation like her brain is numb. Also has  ongoing upper neck pain without radiating symptoms.    02/07/2022 Vanessa Lara is a 68 y.o. female here for follow up for rheumatoid arthritis with chronic joint pain in multiple areas and chronic fatigue labs showing elevated rheumatoid factor. Kevzara  new start on 12/15/2021. She felt an improvement when taking the injections within first weeks. Accidentally took the shots twice in a week due to forgetfulness or mistake but did not notice any major problems. She called in on account of increased joint pain but felt no improvement with the prescribed medrol  dose pack. She did feel more lightheadedness or off balance feeling while taking this so stopped after about 3 days. Currently back pain and her left foot pain are worst issues.   Previous HPI 12/05/2021 Vanessa Lara is a 68 y.o. female here for evaluation of rheumatoid arthritis with chronic joint pain in multiple areas and chronic fatigue labs showing elevated rheumatoid factor. She has history of mitral valve prolapse, severe osteoporosis, osteoarthritis, and severe weight loss and deconditioning. She was diagnosed with seropositive RA in 2018 with Dr. Curt and most recently saw Dr. Mai in 11/2020. At that time recommended to start hydroxychloroquine  for inflammatory arthritis symptoms. She previously tried methotrexate, simponi  aria, and orencia  with poor tolerance. Imaging consistent with degenerative arthritis in cervical spine and knee but also with some partial tendon tear in shoulder and some synovitis present in knee on MRI imaging. She has taken multiple medications for pain including NSAIDs, prednisone , gabapentin , tramadol , and hydrocodone  or oxycodone . She has a lot of pain in multiple areas but is also severely fatigued. She has significant weight loss down to 70 lbs unintentionally reports chronic IBS issues but no recent changes during this period of weight loss. Joint pain also limits her activity quite a bit with chronic  deformities and also ongoing swelling in bilateral hands. She has also developed left sided headache localized above the temporal area. She has a lot of pain at the base of the skull and occiput without clear radiation of symptoms. She is scheduled to see Dr. Rush for headache evaluation and management 3/28.   DMARD Hx AZA LEF HCQ SSZ MTX Simponi  Orencia  Kevzara    05/2017 RF 15 CCP 32 HBV neg HCV neg   No Rheumatology ROS completed.   PMFS History:  Patient Active Problem List   Diagnosis Date Noted   Chronic gastritis without bleeding 08/01/2023   Generalized anxiety  disorder 08/01/2023   Redundant colon 08/01/2023   Mixed hyperlipidemia 08/01/2023   High risk medication use 01/16/2023   History of CVA (cerebrovascular accident) 11/14/2022   Insomnia due to medical condition 03/15/2022   Degeneration of intervertebral disc of cervical region 12/28/2021   Medication monitoring encounter 12/05/2021   Physical deconditioning 11/11/2021   Elevated serum lactate dehydrogenase (LDH) 08/17/2021   Vitamin D  deficiency 08/17/2021   Chronic fatigue 07/25/2021   Vitamin B12 deficiency 07/19/2021   Hardening of the aorta (main artery of the heart) (HCC) 06/21/2021   Solitary lung nodule 06/15/2021   Loss of weight 05/18/2021   Protein-calorie malnutrition, severe (HCC) 05/18/2021   Osteoporosis 06/15/2020   Iron  deficiency anemia 04/09/2020   Primary osteoarthritis involving multiple joints 12/31/2018   Rheumatoid arthritis involving multiple sites with positive rheumatoid factor (HCC) 12/31/2018   Irritable bowel syndrome with both constipation and diarrhea 10/28/2018   Constipation 03/14/2016   Mitral valve prolapse 11/23/2015   High cholesterol 11/23/2015    Past Medical History:  Diagnosis Date   Anemia    Anxiety    Arthritis    RA   Bursitis    Cataract    CHF (congestive heart failure) (HCC)    Collagen vascular disease (HCC)    Depression    Endometriosis     Heart murmur    High cholesterol    Hypertension    IBS (irritable bowel syndrome)    Lymphadenopathy    Mitral valve prolapse    Osteoporosis    RA (rheumatoid arthritis) (HCC)    Rotator cuff tear    bilateral   Stroke (HCC) 06/2022   Uterus, adenomyosis     Family History  Problem Relation Age of Onset   Bone cancer Mother    Brain cancer Father    Brain cancer Sister    Breast cancer Sister    Renal Disease Sister    Pancreatic cancer Neg Hx    Esophageal cancer Neg Hx    Stomach cancer Neg Hx    Rectal cancer Neg Hx    Colon polyps Neg Hx    Past Surgical History:  Procedure Laterality Date   AXILLARY LYMPH NODE BIOPSY Right 10/12/2020   Procedure: RIGHT AXILLARY LYMPH NODE BIOPSY EXCISION;  Surgeon: Aron Shoulders, MD;  Location: [facility];  Service: General;  Laterality: Right;   BUNIONECTOMY Right    CARPAL TUNNEL RELEASE Right    COLONOSCOPY     COLONOSCOPY N/A 12/13/2015   Procedure: COLONOSCOPY;  Surgeon: Claudis RAYMOND Rivet, MD;  Location: [facility];  Service: Endoscopy;  Laterality: N/A;  9:55   Fatty tumor     2011 (left arm)   TONSILLECTOMY     TOTAL ABDOMINAL HYSTERECTOMY     2003   Social History   Social History Narrative   Not on file   Immunization History  Administered Date(s) Administered   Influenza, Quadrivalent, Recombinant, Inj, Pf 07/11/2017     Objective: Vital Signs: There were no vitals taken for this visit.   Physical Exam   Musculoskeletal Exam: ***  CDAI Exam: CDAI Score: -- Patient Global: --; Provider Global: -- Swollen: --; Tender: -- Joint Exam 04/29/2024   No joint exam has been documented for this visit   There is currently no information documented on the homunculus. Go to the Rheumatology activity and complete the homunculus joint exam.  Investigation: No additional findings.  Imaging: XR Lumbar Spine 2-3 Views Result Date: 04/16/2024 [facility], oblique,  lateral views of lumbar spine reviewed.   No acute fracture or spondylolisthesis noted.  There is significant scoliosis.  Loss of lordosis is present.   Recent Labs: Lab Results  Component Value Date   WBC 9.3 03/18/2024   HGB 10.2 (L) 03/18/2024   PLT 413 (H) 03/18/2024   NA 136 03/18/2024   K 3.7 03/18/2024   CL 100 03/18/2024   CO2 26 03/18/2024   GLUCOSE 123 (H) 03/18/2024   BUN 10 03/18/2024   CREATININE 0.67 03/18/2024   BILITOT 0.3 03/18/2024   ALKPHOS 132 (H) 03/18/2024   AST 17 03/18/2024   ALT 9 03/18/2024   PROT 7.0 03/18/2024   ALBUMIN 3.1 (L) 03/18/2024   CALCIUM  8.7 (L) 03/18/2024   GFRAA 92 11/06/2019   QFTBGOLDPLUS NEGATIVE 12/05/2021    Speciality Comments: No specialty comments available.  Procedures:  No procedures performed Allergies: Abatacept , Codeine , Golimumab , Hydrocodone , Methylprednisolone , Methotrexate derivatives, and Prednisone    Assessment / Plan:     Visit Diagnoses: No diagnosis found.  ***  Orders: No orders of the defined types were placed in this encounter.  No orders of the defined types were placed in this encounter.    Follow-Up Instructions: No follow-ups on file.   Shelba SHAUNNA Potters, RT  Note - This record has been created using [employer].  Chart creation errors have been sought, but may not always  have been located. Such creation errors do not reflect on  the standard of medical care.

## 2024-04-23 ENCOUNTER — Encounter: Admitting: Physician Assistant

## 2024-04-29 ENCOUNTER — Ambulatory Visit: Admitting: Internal Medicine

## 2024-04-29 ENCOUNTER — Telehealth: Payer: Self-pay | Admitting: Internal Medicine

## 2024-04-29 DIAGNOSIS — D509 Iron deficiency anemia, unspecified: Secondary | ICD-10-CM

## 2024-04-29 DIAGNOSIS — Z79899 Other long term (current) drug therapy: Secondary | ICD-10-CM

## 2024-04-29 DIAGNOSIS — R5381 Other malaise: Secondary | ICD-10-CM

## 2024-04-29 DIAGNOSIS — E43 Unspecified severe protein-calorie malnutrition: Secondary | ICD-10-CM

## 2024-04-29 DIAGNOSIS — E559 Vitamin D deficiency, unspecified: Secondary | ICD-10-CM

## 2024-04-29 DIAGNOSIS — M150 Primary generalized (osteo)arthritis: Secondary | ICD-10-CM

## 2024-04-29 DIAGNOSIS — M810 Age-related osteoporosis without current pathological fracture: Secondary | ICD-10-CM

## 2024-04-29 DIAGNOSIS — M0579 Rheumatoid arthritis with rheumatoid factor of multiple sites without organ or systems involvement: Secondary | ICD-10-CM

## 2024-04-29 NOTE — Telephone Encounter (Signed)
 I called patient, patient verbalized understanding.

## 2024-04-29 NOTE — Telephone Encounter (Signed)
 She does not tolerate oral steroids so if symptoms are due to RA disease activity I would not be able to determine that or to do much for treatment without seeing her.  If pain is that bad she may need to get checked out at the emergency department. She had recent steroid injections in orthopedics clinic, and has follow up with them in 2 days as well. So if she does not want to get it checked emergently they might have some recommendations for symptom management.

## 2024-04-29 NOTE — Telephone Encounter (Signed)
 Patient called and cancelled her appointment scheduled for today at 3:20 pm with Dr. Jeannetta.  Patient states she is in too much pain to come in the office.  Patient states she has pain in her hands, feet, legs, and arms.  Patient asked for Dr. Jeannetta to call her back to advise her on what to do.  Patient began to cry stating her pain level was unbearable and cannot leave the house.

## 2024-04-30 NOTE — Progress Notes (Signed)
 Office Visit Note  Patient: Vanessa Lara             Date of Birth: July 01, 1956           MRN: [id number]             PCP: Suanne Pfeiffer, NP Referring: Suanne Pfeiffer, NP Visit Date: 05/06/2024   Subjective:  Follow-up (Patient states she wants to change her RA medicine. Patient would also like lab work done, specifically a blood glucose level. )    Discussed the use of AI scribe software for clinical note transcription with the patient, who gave verbal consent to proceed.  History of Present Illness   Vanessa Lara is a 68 y.o. female here for follow up with rheumatoid arthritis on HCQ 200 mg daily x5 days weekly who presents with severe pain and medication intolerance. She presents with inadequate response to current treatment and concerns about medication side effects.  She has had multiple recent injections throughout orthopedics clinic, but these only provide relief for about a month and a half. She is frustrated with the limited duration of relief and the restrictions on the frequency and dosage of these injections. She is seeking alternative treatments that do not involve narcotics.  She uses a muscle relaxer that causes significant cognitive impairment, limiting her ability to function during the day. To manage this, she takes small doses throughout the day, but this is not ideal. She is concerned about adding more medication due to her long-standing irritable bowel syndrome (IBS), which has been present for 53 years and has left her gastrointestinal system compromised.  She is interested in trying a different rheumatoid arthritis medication due to previous drugs either causing side effects or being ineffective. She recalls a past x-ray showing bone erosion and wants to prevent progression. Her last sedimentation rate was 58 in April.  She describes significant functional limitations due to her condition, particularly in her hands, which she describes as having difficulty  with manipulation. She also experiences issues with her shoulders and back, where she received an injection two weeks ago that has since worn off. She notes muscle atrophy and difficulty maintaining foot position, which she attributes to overall muscle weakness.  Her diet is limited due to IBS, and she struggles to maintain adequate calorie intake. She is sevrely underweight and worsening, down to 62 lbs and BMI of 12. She eats red meat, fish, chicken, green vegetables, and bread but avoids high-fat foods due to gastrointestinal distress.  She recalls a past incident of heart failure induced by medication, which caused significant water  weight gain without typical symptoms like swelling. She is cautious about medication side effects, particularly those affecting her heart.       Previous HPI 01/07/2024 Vanessa Lara is a 68 year old female with rheumatoid arthritis on HCQ 200 mg daily x5 days weekly who presents with severe pain and medication intolerance.   She experiences severe pain due to arthritis, exacerbated by muscle spasms in her back. The pain is intense and significantly impacts her quality of life, leaving her feeling 'miserable' and without a 'happy place'. Flare-ups occur every other day or every third day, preventing her from performing daily activities.   She has a history of intolerance to several medications. Azathioprine  caused severe gastrointestinal issues, and she is currently on hydroxychloroquine  for arthritis. She cannot take codeine  due to gastrointestinal side effects related to her IBS. Muscle relaxants like Flexeril  have been ineffective in managing her symptoms.  A recent bone marrow study was performed without anesthesia due to low blood pressure, which she found distressing. She experiences dizziness throughout the day, which she suspects may be related to anemia or other underlying issues. She has gained a small amount of weight recently, about four ounces.   She  has been unable to follow up with an orthopedist due to her condition flaring up, making it difficult for her to travel. She also struggles with arranging transportation as her plans often fall through at the last minute.      Previous HPI 11/19/23 Vanessa Lara is a 69 year old female with rheumatoid arthritis on hydroxychloroquine  200 mg once daily for 5 days per week.   She experiences significant pain and stiffness, particularly on Sundays, leading her to take an additional half dose of her medication on Sundays for the past two weekends. She describes waking up with severe stiffness and being unable to move by Monday if she does not take the extra dose. She is seeking an alternative medication to manage her symptoms more effectively.   She describes pain associated with bursitis and possible rotator cuff issues, noting a pressure point that radiates from her shoulder down towards her armpit and into her upper arm, forming a 'V' shape. She is scheduled to receive an injection for this pain.   She reports issues with her foot, including a previously twisted or sprained ankle that healed but then became painful and red, particularly around a bone that feels bruised. She also mentions a rod in her big toe that has shifted, causing pain and misalignment of her toes. She was previously told that surgery could correct her toes, but now her arthritis and varicose veins have worsened, raising concerns about circulation, infection, and potential amputation. Her bone density is very low, complicating surgical options.   She mentions her knuckles are sore, with one being particularly bad, and she experiences pain that radiates down her ear. She also notes fluctuations in her weight, questioning whether it is due to scale differences or her condition.      Previous HPI 07/04/23 Vanessa Lara is a 68 y.o. female here for follow up for seropositive RA and osteoarthritis and osteoporosis she stopped  hydroxychloroquine  after last visit due to concern about cardiac arrhythmia alongside Paxil  and duloxetine .  She was only able to take a low-dose of the medication she did feel it improved her overall condition but still had persistent arthritis of numerous areas regardless.  She had an updated bone density scan on July 15 still with osteoporosis of the lumbar spine although better than 2022 and with femur osteopenia similar to the previous test.  She is taking Venofer  for iron  deficiency anemia this ongoing to correct it with her goal of enough to tolerate foot surgery.  She underwent endoscopy with no upper or lower GI findings to account for heme positive stool. Acute problem today increased right ankle pain and swelling started since 3 weeks ago.  She did not recall any fall trip or other trauma to that area.  Initially there was a high degree of swelling he put a wrap around this and can see prominent indentation the swelling has largely decreased compared to the outset but still has pain with direct pressure and with weightbearing.   Imaging reviewed DEXA Review AP Spine L1-L2 04/16/23  -3.8    0.712 g/cm2 04/12/21  -4.5    0.627 g/cm2 04/13/20  -4.2    0.665 g/cm2  DualFemur Total Mean 04/16/23  -2.6    0.680 g/cm2 04/12/21  -2.6    0.676 g/cm2 04/13/20  -2.2    0.736 g/cm2     03/08/23 Vanessa Lara is a 68 y.o. female here for follow up for seropositive RA.  Currently off any disease specific medication she cannot tolerate taking the sulfasalazine  due to severe stomach irritation described as burning up her stomach.  Continues having joint pain and stiffness on a daily basis overall feels her function is very poor due to the severity.  Had labs last month still anemic at hemoglobin of 9.8 being treated with infusion for iron  deficiency anemia.   01/16/23 Vanessa Lara is a 68 y.o. female here for follow up for seropositive RA.  Currently health maintenance treatment after discontinuing 5 mg  prednisone  is on medication for joint pain for RA along with osteoarthritis and chronic pain syndrome.  Had recent knee steroid injection since her last visit but reports overall widespread symptoms are severe.  Particularly is been noticing some increased trouble with pain and some swelling involving the wrist worse on left side.   10/03/22 Vanessa Lara is a 68 y.o. female here for follow up for seropositive RA currently off long term DMARD with prednisone  5 mg daily as needed.  She is overall not doing well feels like pain and stiffness is a big problem but more severe is her fatigue and feels a generalized getting weaker with difficulty moving and with her upper body strength.  She pretty consistently has to take the prednisone  for otherwise does not move around much during the day.  Continues to get a benefit with the Flexeril  at nighttime with slight decrease in frequency of awakenings.  She is also concerned about getting an update to her bone density testing ongoing oral alendronate  treatment.  She is also still having a lot of pain and difficulty with walking from her claw toe deformities on the foot and planning to follow-up with surgery clinic about management of this.   06/28/22 Vanessa Lara is a 68 y.o. female here for follow up for seropositive RA now off of treatment after stopping the Kevzara .  Since she stopped taking the medication her complaint of night sweats and fatigue have improved. She has seen a worsening of joint pain in several areas particularly around the MCP joints of both hands. For knee pain she saw orthopedic surgery clinic yesterday and had injections on both sides.  She also discussed getting set up to do right SI joint steroid injection.  Biggest complaint today is back pain on the right side around the middle of the back and around the scapula.  This bothers her especially with certain movements and trying to bend side to side.    05/18/2022  Vanessa Lara is a 68 y.o.  female here for follow up for seropositive rheumatoid arthritis on Kevzara  150 mg [place] q14days. She feels arthritis has been fairly well controlled but has pain worse in her left foot mostly. She had GI issues suspected UTI but with negative culture since last visit. She feels a sensation like her brain is numb. Also has ongoing upper neck pain without radiating symptoms.    02/07/2022 Vanessa Lara is a 68 y.o. female here for follow up for rheumatoid arthritis with chronic joint pain in multiple areas and chronic fatigue labs showing elevated rheumatoid factor. Kevzara  new start on 12/15/2021. She felt an improvement when taking the injections within first weeks.  Accidentally took the shots twice in a week due to forgetfulness or mistake but did not notice any major problems. She called in on account of increased joint pain but felt no improvement with the prescribed medrol  dose pack. She did feel more lightheadedness or off balance feeling while taking this so stopped after about 3 days. Currently back pain and her left foot pain are worst issues.   Previous HPI 12/05/2021 Vanessa Lara is a 68 y.o. female here for evaluation of rheumatoid arthritis with chronic joint pain in multiple areas and chronic fatigue labs showing elevated rheumatoid factor. She has history of mitral valve prolapse, severe osteoporosis, osteoarthritis, and severe weight loss and deconditioning. She was diagnosed with seropositive RA in 2018 with Dr. Curt and most recently saw Dr. Mai in 11/2020. At that time recommended to start hydroxychloroquine  for inflammatory arthritis symptoms. She previously tried methotrexate, simponi  aria, and orencia  with poor tolerance. Imaging consistent with degenerative arthritis in cervical spine and knee but also with some partial tendon tear in shoulder and some synovitis present in knee on MRI imaging. She has taken multiple medications for pain including NSAIDs, prednisone , gabapentin ,  tramadol , and hydrocodone  or oxycodone . She has a lot of pain in multiple areas but is also severely fatigued. She has significant weight loss down to 70 lbs unintentionally reports chronic IBS issues but no recent changes during this period of weight loss. Joint pain also limits her activity quite a bit with chronic deformities and also ongoing swelling in bilateral hands. She has also developed left sided headache localized above the temporal area. She has a lot of pain at the base of the skull and occiput without clear radiation of symptoms. She is scheduled to see Dr. Rush for headache evaluation and management 3/28.   DMARD Hx AZA LEF HCQ SSZ MTX Simponi  Orencia  Kevzara    05/2017 RF 15 CCP 32 HBV neg HCV neg   Review of Systems  Constitutional:  Positive for fatigue.  HENT:  Positive for mouth dryness. Negative for mouth sores.   Eyes:  Positive for dryness.  Respiratory:  Negative for shortness of breath.   Cardiovascular:  Positive for palpitations. Negative for chest pain.  Gastrointestinal:  Positive for constipation and diarrhea. Negative for blood in stool.  Endocrine: Negative for increased urination.  Genitourinary:  Positive for involuntary urination.  Musculoskeletal:  Positive for joint pain, gait problem, joint pain, joint swelling, myalgias, muscle weakness, morning stiffness, muscle tenderness and myalgias.  Skin:  Positive for sensitivity to sunlight. Negative for color change, rash and hair loss.  Allergic/Immunologic: Negative for susceptible to infections.  Neurological:  Positive for dizziness and headaches.  Hematological:  Negative for swollen glands.  Psychiatric/Behavioral:  Negative for depressed mood and sleep disturbance. The patient is nervous/anxious.     PMFS History:  Patient Active Problem List   Diagnosis Date Noted   Hyponatremia 05/07/2024   Temporal arteritis syndrome (HCC) 05/07/2024    Class: Acute   Hypotension 05/07/2024   Chronic  gastritis without bleeding 08/01/2023   Generalized anxiety disorder 08/01/2023   Redundant colon 08/01/2023   Mixed hyperlipidemia 08/01/2023   History of CVA (cerebrovascular accident) 11/14/2022   Insomnia due to medical condition 03/15/2022   Degeneration of intervertebral disc of cervical region 12/28/2021   Physical deconditioning 11/11/2021   Elevated serum lactate dehydrogenase (LDH) 08/17/2021   Vitamin D  deficiency 08/17/2021   Vitamin B12 deficiency 07/19/2021   Hardening of the aorta (main artery of the heart) (HCC) 06/21/2021  Solitary lung nodule 06/15/2021   Protein-calorie malnutrition, severe (HCC) 05/18/2021   Osteoporosis 06/15/2020   Iron  deficiency anemia 04/09/2020   Primary osteoarthritis involving multiple joints 12/31/2018   Rheumatoid arthritis involving multiple sites with positive rheumatoid factor (HCC) 12/31/2018   Irritable bowel syndrome with both constipation and diarrhea 10/28/2018   Mitral valve prolapse 11/23/2015    Past Medical History:  Diagnosis Date   Anemia    Anxiety    Arthritis    RA   Bursitis    Cataract    CHF (congestive heart failure) (HCC)    Collagen vascular disease (HCC)    Depression    Endometriosis    Heart murmur    High cholesterol    Hypertension    IBS (irritable bowel syndrome)    Lymphadenopathy    Mitral valve prolapse    Osteoporosis    RA (rheumatoid arthritis) (HCC)    Rotator cuff tear    bilateral   Stroke (HCC) 06/2022   Uterus, adenomyosis     Family History  Problem Relation Age of Onset   Bone cancer Mother    Brain cancer Father    Brain cancer Sister    Breast cancer Sister    Renal Disease Sister    Pancreatic cancer Neg Hx    Esophageal cancer Neg Hx    Stomach cancer Neg Hx    Rectal cancer Neg Hx    Colon polyps Neg Hx    Past Surgical History:  Procedure Laterality Date   ARTERY BIOPSY Left 05/08/2024   Procedure: LEFT TEMPORAL ARTERY BIOPSY;  Surgeon: Pearline Norman RAMAN, MD;   Location: [facility] OR;  Service: Vascular;  Laterality: Left;   AXILLARY LYMPH NODE BIOPSY Right 10/12/2020   Procedure: RIGHT AXILLARY LYMPH NODE BIOPSY EXCISION;  Surgeon: Aron Shoulders, MD;  Location: [facility];  Service: General;  Laterality: Right;   BUNIONECTOMY Right    CARPAL TUNNEL RELEASE Right    COLONOSCOPY     COLONOSCOPY N/A 12/13/2015   Procedure: COLONOSCOPY;  Surgeon: Claudis RAYMOND Rivet, MD;  Location: [facility];  Service: Endoscopy;  Laterality: N/A;  9:55   Fatty tumor     2011 (left arm)   TONSILLECTOMY     TOTAL ABDOMINAL HYSTERECTOMY     2003   Social History   Social History Narrative   Not on file   Immunization History  Administered Date(s) Administered   Influenza, Quadrivalent, Recombinant, Inj, Pf 07/11/2017     Objective: Vital Signs: BP 114/76 (BP Location: Left Arm, Patient Position: Sitting, Cuff Size: Small)   Pulse (!) 118   Resp 14   Ht 5' 0.25 (1.53 m)   Wt 62 lb (28.1 kg)   BMI 12.01 kg/m    Physical Exam Constitutional:      Comments: Underweight  Eyes:     Conjunctiva/sclera: Conjunctivae normal.  Cardiovascular:     Rate and Rhythm: Regular rhythm. Tachycardia present.  Pulmonary:     Effort: Pulmonary effort is normal.     Breath sounds: Normal breath sounds.  Musculoskeletal:     Right lower leg: No edema.     Left lower leg: No edema.  Lymphadenopathy:     Cervical: No cervical adenopathy.  Skin:    General: Skin is warm and dry.     Findings: No rash.  Neurological:     Mental Status: She is alert.  Psychiatric:        Mood and Affect: Mood normal.  Musculoskeletal Exam:  Elbows full ROM no tenderness or swelling Wrists full ROM no tenderness or swelling Fingers chronic MCP joint thickening, right hand with reducible subluxation and lateral deviation, left hand worse incompletely reducible lateral deviation Severe diffuse neck and back midline and muscular pain with palpation Knees full ROM  patellofemoral crepitus, no palpable effusion Right ankle tenderness to pressure   Investigation: No additional findings.  Imaging: CT HEAD WO CONTRAST Result Date: 05/07/2024 EXAM: CT HEAD WITHOUT CONTRAST 05/07/2024 08:42:03 AM TECHNIQUE: CT of the head was performed without the administration of intravenous contrast. Automated exposure control, iterative reconstruction, and/or weight based adjustment of the mA/kV was utilized to reduce the radiation dose to as low as reasonably achievable. COMPARISON: MRI head dated 10/17/2022. CLINICAL HISTORY: Headache. Patient complains of migraine for a couple of weeks and states she is scared she had a stroke, stroke screen negative per EMS, capillary blood glucose 92, rates headache 10/10 and has a history of migraines. FINDINGS: BRAIN AND VENTRICLES: No acute hemorrhage. Gray-white differentiation is preserved. No hydrocephalus. No extra-axial collection. No mass effect or midline shift. ORBITS: Bilateral lens replacement. SINUSES: Mucosal thickening in the sphenoid sinuses. SOFT TISSUES AND SKULL: Mild atherosclerosis of the skull base involving the carotid siphons. No acute soft tissue abnormality. No skull fracture. IMPRESSION: 1. No acute intracranial abnormality related to the patient's history of migraines. Electronically signed by: Donnice Mania MD 05/07/2024 09:17 AM EDT RP Workstation: [id number]   XR Lumbar Spine 2-3 Views Result Date: 04/16/2024 AP, oblique, lateral views of lumbar spine reviewed.  No acute fracture or spondylolisthesis noted.  There is significant scoliosis.  Loss of lordosis is present.   Recent Labs: Lab Results  Component Value Date   WBC 6.3 05/08/2024   HGB 8.4 (L) 05/08/2024   PLT 245 05/08/2024   NA 137 05/12/2024   K 4.0 05/12/2024   CL 99 05/12/2024   CO2 29 05/12/2024   GLUCOSE 105 (H) 05/12/2024   BUN 9 05/12/2024   CREATININE 0.58 05/12/2024   BILITOT 0.3 05/12/2024   ALKPHOS 96 05/07/2024   AST 19  05/12/2024   ALT 10 05/12/2024   PROT 6.9 05/12/2024   ALBUMIN 3.6 05/07/2024   CALCIUM  9.2 05/12/2024   GFRAA 92 11/06/2019   QFTBGOLDPLUS NEGATIVE 05/06/2024    Speciality Comments: Patient states they can't get one till next year   Procedures:  No procedures performed Allergies: Abatacept , Codeine , Golimumab , Hydrocodone , Methylprednisolone , Methotrexate derivatives, and Prednisone    Assessment / Plan:     Visit Diagnoses: Rheumatoid arthritis involving multiple sites with positive rheumatoid factor (HCC) - Consider referral to a pain management specialist - Plan: Sedimentation rate, C-reactive protein Active disease with persistent elevated sedimentation rate. Previous treatments ineffective or caused side effects. Discussed TNF inhibitors, mechanism, and risks. No significant risk factors for skin cancer, lymphoma, or heart failure. Willing to try TNF inhibitor despite past drug-induced heart failure. - Rechecking sed rate and CRP for disease activity monitoring - Start Enbrel 50 mg [place] weekly - Continue HCq 200 mg daily x5 days per week - Monitor for infection and other potential side effects. - Order x-rays of hands to assess for further joint erosion.  High risk medication use - HCQ 200 mg daily x5 days weekly - Plan: QuantiFERON-TB Gold Plus, Comprehensive metabolic panel with GFR Had recent blood count which was appropriate.  He is updated labs for monitoring if resuming biologic DMARD.  Has been getting into issue with multiple drug intolerances especially oral medications  for stomach so prefer injectables.  Has had some increased frequency of infections to be the other main concern with recent new biologic depart. - Checking CMP and QuantiFERON baseline for medication monitoring planning to start TNF inhibitor  Physical deconditioning - tizanidine  for muscle spasm management  Drug-induced heart failure No current symptoms but requires monitoring due to potential risks with  new rheumatoid arthritis treatment. - Review recent cardiac testing results. - Consider referral to cardiology for further evaluation.  Muscle atrophy due to moderate protein-calorie malnutrition Muscle atrophy due to insufficient caloric intake, particularly carbohydrates and fats. - Increase caloric intake with a focus on carbohydrates and fats. - Educate on the importance of balanced diet including adequate protein, carbohydrates, and fats.  Irritable bowel syndrome Exacerbated by dietary changes, difficulty tolerating high-fat foods and supplements like Ensure. - Continue current dietary management focusing on tolerable foods. - Avoid high-fat foods and nutritional supplements that exacerbate symptoms.        Orders: Orders Placed This Encounter  Procedures   QuantiFERON-TB Gold Plus   Sedimentation rate   Comprehensive metabolic panel with GFR   C-reactive protein   No orders of the defined types were placed in this encounter.    Follow-Up Instructions: Return in about 3 months (around 08/06/2024) for RA on HCQ/ENB start f/u 3mos.   Lonni LELON Ester, MD  Note - This record has been created using [employer].  Chart creation errors have been sought, but may not always  have been located. Such creation errors do not reflect on  the standard of medical care.

## 2024-05-01 ENCOUNTER — Other Ambulatory Visit: Payer: Self-pay | Admitting: Internal Medicine

## 2024-05-01 ENCOUNTER — Encounter: Payer: Self-pay | Admitting: Sports Medicine

## 2024-05-01 ENCOUNTER — Ambulatory Visit (INDEPENDENT_AMBULATORY_CARE_PROVIDER_SITE_OTHER): Admitting: Sports Medicine

## 2024-05-01 VITALS — Wt <= 1120 oz

## 2024-05-01 DIAGNOSIS — M542 Cervicalgia: Secondary | ICD-10-CM

## 2024-05-01 DIAGNOSIS — M7551 Bursitis of right shoulder: Secondary | ICD-10-CM | POA: Diagnosis not present

## 2024-05-01 DIAGNOSIS — M0579 Rheumatoid arthritis with rheumatoid factor of multiple sites without organ or systems involvement: Secondary | ICD-10-CM

## 2024-05-01 DIAGNOSIS — M25511 Pain in right shoulder: Secondary | ICD-10-CM | POA: Diagnosis not present

## 2024-05-01 DIAGNOSIS — M150 Primary generalized (osteo)arthritis: Secondary | ICD-10-CM

## 2024-05-01 DIAGNOSIS — M47812 Spondylosis without myelopathy or radiculopathy, cervical region: Secondary | ICD-10-CM | POA: Diagnosis not present

## 2024-05-01 DIAGNOSIS — M25512 Pain in left shoulder: Secondary | ICD-10-CM

## 2024-05-01 DIAGNOSIS — G8929 Other chronic pain: Secondary | ICD-10-CM

## 2024-05-01 DIAGNOSIS — R5381 Other malaise: Secondary | ICD-10-CM

## 2024-05-01 NOTE — Progress Notes (Unsigned)
 Patient says that she would like a form for a handicap parking placard, as well as a note that states that she needs help moving her trash from her home. She is here today for repeat injection. She says that her pain is still in the right upper back and follows the scapula down to the bottom of her back and under her rib cage. Patient also mentions pain through the front of the left shoulder that follows her side down to her rib cage. She says that she recently had an injection into the back of the left shoulder, but would like to know if she can have another into the front of the left, if it might help her pain.

## 2024-05-01 NOTE — Progress Notes (Unsigned)
 Vanessa Lara - 68 y.o. female MRN [id number]  Date of birth: 11/24/1955  Office Visit Note: Visit Date: 05/01/2024 PCP: Suanne Pfeiffer, NP Referred by: Suanne Pfeiffer, NP  Subjective: Chief Complaint  Patient presents with  . Middle Back - Follow-up   HPI: Vanessa Lara is a pleasant 68 y.o. female who presents today for ***    Pertinent ROS were reviewed with the patient and found to be negative unless otherwise specified above in HPI.   Assessment & Plan: Visit Diagnoses: No diagnosis found.  Plan: ***  Follow-up: No follow-ups on file.   Meds & Orders: No orders of the defined types were placed in this encounter.  No orders of the defined types were placed in this encounter.    Procedures: No procedures performed      Clinical History: MRI CERVICAL SPINE WITHOUT CONTRAST   TECHNIQUE: Multiplanar, multisequence MR imaging of the cervical spine was performed. No intravenous contrast was administered.   COMPARISON:  None.   FINDINGS: Alignment: 2 mm retrolisthesis of C5 on C6.   Vertebrae: No acute fracture, evidence of discitis, or bone lesion.   Cord: Normal signal and morphology.   Posterior Fossa, vertebral arteries, paraspinal tissues: Posterior fossa demonstrates no focal abnormality. Vertebral artery flow voids are maintained. Paraspinal soft tissues are unremarkable.   Disc levels:   Discs: Degenerative disease with disc height loss at C4-5 and C5-6.   C2-3: No significant disc bulge. No neural foraminal stenosis. No central canal stenosis.   C3-4: Mild broad-based disc bulge. No foraminal or central canal stenosis.   C4-5: Mild broad-based disc bulge. No foraminal or central canal stenosis.   C5-6: Mild broad-based disc bulge. Bilateral uncovertebral degenerative changes. Moderate left and severe right foraminal stenosis. No spinal stenosis.   C6-7: Mild broad-based disc bulge. No foraminal or central canal stenosis.    C7-T1: No significant disc bulge. No neural foraminal stenosis. No central canal stenosis.   IMPRESSION: 1. At C5-6 there is a mild broad-based disc bulge. Bilateral uncovertebral degenerative changes. Moderate left and severe right foraminal stenosis. 2. Otherwise, mild cervical spine spondylosis as described above.     Electronically Signed   By: Julaine Blanch M.D.   On: 12/16/2021 10:51  She reports that she has been smoking cigarettes. She started smoking about 45 years ago. She has a 16.5 pack-year smoking history. She has never used smokeless tobacco. No results for input(s): HGBA1C, LABURIC in the last 8760 hours.  Objective:   Vital Signs: Wt 62 lb (28.1 kg)   BMI 11.86 kg/m   Physical Exam  Gen: Well-appearing, in no acute distress; non-toxic CV: Well-perfused. Warm.  Resp: Breathing unlabored on room air; no wheezing. Psych: Fluid speech in conversation; appropriate affect; normal thought process  Ortho Exam - ***  Imaging: No results found.  Past Medical/Family/Surgical/Social History: Medications & Allergies reviewed per EMR, new medications updated. Patient Active Problem List   Diagnosis Date Noted  . Chronic gastritis without bleeding 08/01/2023  . Generalized anxiety disorder 08/01/2023  . Redundant colon 08/01/2023  . Mixed hyperlipidemia 08/01/2023  . High risk medication use 01/16/2023  . History of CVA (cerebrovascular accident) 11/14/2022  . Insomnia due to medical condition 03/15/2022  . Degeneration of intervertebral disc of cervical region 12/28/2021  . Medication monitoring encounter 12/05/2021  . Physical deconditioning 11/11/2021  . Elevated serum lactate dehydrogenase (LDH) 08/17/2021  . Vitamin D  deficiency 08/17/2021  . Chronic fatigue 07/25/2021  . Vitamin B12 deficiency 07/19/2021  .  Hardening of the aorta (main artery of the heart) (HCC) 06/21/2021  . Solitary lung nodule 06/15/2021  . Loss of weight 05/18/2021  .  Protein-calorie malnutrition, severe (HCC) 05/18/2021  . Osteoporosis 06/15/2020  . Iron  deficiency anemia 04/09/2020  . Primary osteoarthritis involving multiple joints 12/31/2018  . Rheumatoid arthritis involving multiple sites with positive rheumatoid factor (HCC) 12/31/2018  . Irritable bowel syndrome with both constipation and diarrhea 10/28/2018  . Constipation 03/14/2016  . Mitral valve prolapse 11/23/2015  . High cholesterol 11/23/2015   Past Medical History:  Diagnosis Date  . Anemia   . Anxiety   . Arthritis    RA  . Bursitis   . Cataract   . CHF (congestive heart failure) (HCC)   . Collagen vascular disease (HCC)   . Depression   . Endometriosis   . Heart murmur   . High cholesterol   . Hypertension   . IBS (irritable bowel syndrome)   . Lymphadenopathy   . Mitral valve prolapse   . Osteoporosis   . RA (rheumatoid arthritis) (HCC)   . Rotator cuff tear    bilateral  . Stroke (HCC) 06/2022  . Uterus, adenomyosis    Family History  Problem Relation Age of Onset  . Bone cancer Mother   . Brain cancer Father   . Brain cancer Sister   . Breast cancer Sister   . Renal Disease Sister   . Pancreatic cancer Neg Hx   . Esophageal cancer Neg Hx   . Stomach cancer Neg Hx   . Rectal cancer Neg Hx   . Colon polyps Neg Hx    Past Surgical History:  Procedure Laterality Date  . AXILLARY LYMPH NODE BIOPSY Right 10/12/2020   Procedure: RIGHT AXILLARY LYMPH NODE BIOPSY EXCISION;  Surgeon: Aron Shoulders, MD;  Location: [facility];  Service: General;  Laterality: Right;  . BUNIONECTOMY Right   . CARPAL TUNNEL RELEASE Right   . COLONOSCOPY    . COLONOSCOPY N/A 12/13/2015   Procedure: COLONOSCOPY;  Surgeon: Claudis RAYMOND Rivet, MD;  Location: [facility];  Service: Endoscopy;  Laterality: N/A;  9:55  . Fatty tumor     2011 (left arm)  . TONSILLECTOMY    . TOTAL ABDOMINAL HYSTERECTOMY     2003   Social History   Occupational History  . Occupation:  retired  Tobacco Use  . Smoking status: Some Days    Current packs/day: 0.00    Average packs/day: 0.5 packs/day for 33.0 years (16.5 ttl pk-yrs)    Types: Cigarettes    Start date: 53    Last attempt to quit: 2013    Years since quitting: 12.5  . Smokeless tobacco: Never  Vaping Use  . Vaping status: Never Used  Substance and Sexual Activity  . Alcohol use: No    Alcohol/week: 0.0 standard drinks of alcohol  . Drug use: No  . Sexual activity: Not Currently

## 2024-05-01 NOTE — Telephone Encounter (Signed)
 Last Fill: 01/25/2024  Next Visit: 05/06/2024  Last Visit: 01/07/2024  Dx: Rheumatoid arthritis involving multiple sites with positive rheumatoid factor   Current Dose per office note on 01/07/2024: dose not mentioned, Advise use of tizanidine  before bedtime or when planning to rest due to sedative effects.   Okay to refill Tizanidine ?

## 2024-05-02 DIAGNOSIS — M25511 Pain in right shoulder: Secondary | ICD-10-CM | POA: Diagnosis not present

## 2024-05-02 DIAGNOSIS — M7551 Bursitis of right shoulder: Secondary | ICD-10-CM

## 2024-05-02 MED ORDER — LIDOCAINE HCL 1 % IJ SOLN
1.0000 mL | INTRAMUSCULAR | Status: AC | PRN
  Administered 2024-05-02: 1 mL

## 2024-05-02 MED ORDER — BUPIVACAINE HCL 0.25 % IJ SOLN
1.0000 mL | INTRAMUSCULAR | Status: AC | PRN
  Administered 2024-05-02: 1 mL via INTRA_ARTICULAR

## 2024-05-02 MED ORDER — METHYLPREDNISOLONE ACETATE 40 MG/ML IJ SUSP
40.0000 mg | INTRAMUSCULAR | Status: AC | PRN
  Administered 2024-05-02: 40 mg via INTRA_ARTICULAR

## 2024-05-05 ENCOUNTER — Ambulatory Visit

## 2024-05-05 MED ORDER — SODIUM CHLORIDE 0.9 % IV SOLN
510.0000 mg | Freq: Once | INTRAVENOUS | Status: DC
  Filled 2024-05-05: qty 17

## 2024-05-06 ENCOUNTER — Encounter: Attending: Gastroenterology | Admitting: Internal Medicine

## 2024-05-06 ENCOUNTER — Encounter: Payer: Self-pay | Admitting: Internal Medicine

## 2024-05-06 VITALS — BP 114/76 | HR 118 | Resp 14 | Ht 60.25 in | Wt <= 1120 oz

## 2024-05-06 DIAGNOSIS — E43 Unspecified severe protein-calorie malnutrition: Secondary | ICD-10-CM | POA: Diagnosis not present

## 2024-05-06 DIAGNOSIS — E559 Vitamin D deficiency, unspecified: Secondary | ICD-10-CM | POA: Insufficient documentation

## 2024-05-06 DIAGNOSIS — M0579 Rheumatoid arthritis with rheumatoid factor of multiple sites without organ or systems involvement: Secondary | ICD-10-CM | POA: Diagnosis not present

## 2024-05-06 DIAGNOSIS — Z79899 Other long term (current) drug therapy: Secondary | ICD-10-CM | POA: Diagnosis not present

## 2024-05-06 DIAGNOSIS — R5381 Other malaise: Secondary | ICD-10-CM | POA: Insufficient documentation

## 2024-05-06 DIAGNOSIS — Z7189 Other specified counseling: Secondary | ICD-10-CM | POA: Insufficient documentation

## 2024-05-06 DIAGNOSIS — D509 Iron deficiency anemia, unspecified: Secondary | ICD-10-CM | POA: Insufficient documentation

## 2024-05-06 DIAGNOSIS — M316 Other giant cell arteritis: Secondary | ICD-10-CM | POA: Insufficient documentation

## 2024-05-06 DIAGNOSIS — D500 Iron deficiency anemia secondary to blood loss (chronic): Secondary | ICD-10-CM | POA: Insufficient documentation

## 2024-05-06 DIAGNOSIS — M150 Primary generalized (osteo)arthritis: Secondary | ICD-10-CM | POA: Insufficient documentation

## 2024-05-06 DIAGNOSIS — M810 Age-related osteoporosis without current pathological fracture: Secondary | ICD-10-CM | POA: Insufficient documentation

## 2024-05-06 DIAGNOSIS — E7800 Pure hypercholesterolemia, unspecified: Secondary | ICD-10-CM | POA: Insufficient documentation

## 2024-05-06 NOTE — Progress Notes (Signed)
 Pharmacy Note  Subjective: Patient presents today to the [facility] for follow up office visit.  Patient seen by the pharmacist for counseling on Enbrel for rheumatoid arthritis. Currently taking hydroxychloroquine  200mg  once daily x 5 days per week  Previously on Kevzara  (waning response), methotrexate, Simponi  Aria, Orencia , sulfasalazine , leflunomide, azathioprine   Diagnosis of heart failure: No  Objective:  CBC    Component Value Date/Time   WBC 9.3 03/18/2024 1446   RBC 3.67 (L) 03/18/2024 1446   HGB 10.2 (L) 03/18/2024 1446   HGB 11.5 10/07/2020 1321   HCT 32.5 (L) 03/18/2024 1446   HCT 34.0 10/07/2020 1321   PLT 413 (H) 03/18/2024 1446   PLT 462 (H) 10/07/2020 1321   MCV 88.6 03/18/2024 1446   MCV 91 10/07/2020 1321   MCH 27.8 03/18/2024 1446   MCHC 31.4 03/18/2024 1446   RDW 16.6 (H) 03/18/2024 1446   RDW 13.7 10/07/2020 1321   LYMPHSABS 1.7 03/18/2024 1446   LYMPHSABS 2.9 10/07/2020 1321   MONOABS 0.4 03/18/2024 1446   EOSABS 0.1 03/18/2024 1446   EOSABS 0.1 10/07/2020 1321   BASOSABS 0.1 03/18/2024 1446   BASOSABS 0.1 10/07/2020 1321     CMP     Component Value Date/Time   NA 136 03/18/2024 1446   NA 141 11/06/2019 1417   K 3.7 03/18/2024 1446   CL 100 03/18/2024 1446   CO2 26 03/18/2024 1446   GLUCOSE 123 (H) 03/18/2024 1446   BUN 10 03/18/2024 1446   BUN 10 11/06/2019 1417   CREATININE 0.67 03/18/2024 1446   CREATININE 0.67 01/16/2023 1655   CALCIUM  8.7 (L) 03/18/2024 1446   PROT 7.0 03/18/2024 1446   ALBUMIN 3.1 (L) 03/18/2024 1446   AST 17 03/18/2024 1446   AST 13 (L) 08/23/2020 1609   ALT 9 03/18/2024 1446   ALT 7 08/23/2020 1609   ALKPHOS 132 (H) 03/18/2024 1446   BILITOT 0.3 03/18/2024 1446   BILITOT 0.2 (L) 08/23/2020 1609   GFRNONAA >60 03/18/2024 1446   GFRNONAA >60 08/23/2020 1609   GFRAA 92 11/06/2019 1417     Baseline Immunosuppressant Therapy Labs TB GOLD    Latest Ref Rng & Units 12/05/2021   10:52 AM   Quantiferon TB Gold  Quantiferon TB Gold Plus NEGATIVE NEGATIVE    Hepatitis Panel   HIV No results found for: HIV Immunoglobulins    Latest Ref Rng & Units 10/18/2023    2:52 PM  Immunoglobulin Electrophoresis  IgG 586 - 1,602 mg/dL 386   IgM 26 - 782 mg/dL 877    SPEP    Latest Ref Rng & Units 03/18/2024    2:46 PM  Serum Protein Electrophoresis  Total Protein 6.5 - 8.1 g/dL 7.0     Assessment/Plan:  Counseled patient that Enbrel is a TNF blocking agent.  Counseled patient on purpose, proper use, and adverse effects of Enbrel.  Reviewed the most common adverse effects including infections, headache, and injection site reactions.  Discussed that there is the possibility of an increased risk of malignancy including non-melanoma skin cancer but it is not well understood if this increased risk is due to the medication or the disease state.  Advised patient to get yearly dermatology exams due to risk of skin cancer.  Counseled patient that Enbrel should be held prior to scheduled surgery.  Counseled patient to avoid live vaccines while on Enbrel.  Recommend annual influenza, PCV 15 or PCV20 or Pneumovax 23, and Shingrix as indicated.  Reviewed the  importance of regular labs while on Enbrel therapy.  Will monitor CBC and CMP 1 month after starting and then every 3 months routinely thereafter. Will monitor TB gold annually. Standing orders placed. Provided patient with medication education material and answered all questions.  Patient consented to Enbrel.  Will upload consent into the media tab.  Reviewed storage instructions for Enbrel.  Advised initial injection must be administered in office.    Will continue hydroxychloroquine  200mg  once daily x 5 days per week  Sherry Pennant, PharmD, MPH, BCPS, CPP Clinical Pharmacist (Rheumatology and Pulmonology)

## 2024-05-06 NOTE — Patient Instructions (Signed)
Etanercept Injection What is this medication? ETANERCEPT (et a [employer]) treats autoimmune conditions, such as psoriasis and certain types of arthritis. It works by slowing down an overactive immune system. It belongs to a group of medications called TNF inhibitors. This medicine may be used for other purposes; ask your health care provider or pharmacist if you have questions. COMMON BRAND NAME(S): Enbrel What should I tell my care team before I take this medication? They need to know if you have any of these conditions: Bleeding disorder Cancer Diabetes Granulomatosis with polyangiitis Heart failure HIV or AIDS Immune system problems Infection, such as tuberculosis (TB) or other bacterial, fungal or viral infections Liver disease Nervous system problems, such as Guillain-Barre syndrome, multiple sclerosis or seizures Recent or upcoming vaccine An unusual or allergic reaction to etanercept, other medications, food, dyes, or preservatives Pregnant or trying to get pregnant Breastfeeding How should I use this medication? The medication is injected under the skin. You will be taught how to prepare and give it. Take it as directed on the prescription label. Keep taking it unless your care team tells you stop. This medication comes with INSTRUCTIONS FOR USE. Ask your pharmacist for directions on how to use this medication. Read the information carefully. Talk to your pharmacist or care team if you have questions. If you use a pen, be sure to take off the outer needle cover before using the dose. It is important that you put your used needles and syringes in a special sharps container. Do not put them in a trash can. If you do not have a sharps container, call your pharmacist or care team to get one. A special MedGuide will be given to you by the pharmacist with each prescription and refill. Be sure to read this information carefully each time. Talk to your care team about the use of this  medication in children. While it may be prescribed for children as young as 68 years of age for selected conditions, precautions do apply. Overdosage: If you think you have taken too much of this medicine contact a poison control center or emergency room at once. NOTE: This medicine is only for you. Do not share this medicine with others. What if I miss a dose? If you miss a dose, take it as soon as you can. If it is almost time for your next dose, take only that dose. Do not take double or extra doses. What may interact with this medication? Do not take this medication with any of the following: Biologic medications, such as adalimumab, certolizumab, golimumab, infliximab Live vaccines Rilonacept This medication may also interact with the following: Abatacept [place] Biologic medications, such as anifrolumab, baricitinib, belimumab, canakinumab, natalizumab, rituximab, sarilumab, tocilizumab, tofacitinib, upadacitinib, vedolizumab Cyclophosphamide Sulfasalazine This list may not describe all possible interactions. Give your health care provider a list of all the medicines, herbs, non-prescription drugs, or dietary supplements you use. Also tell them if you smoke, drink alcohol, or use illegal drugs. Some items may interact with your medicine. What should I watch for while using this medication? Visit your care team for regular checks on your progress. Tell your care team if your symptoms do not start to get better or if they get worse. This medication may increase your risk of getting an infection. Call your care team for advice if you get a fever, chills, sore throat, or other symptoms of a cold or flu. Do not treat yourself. Try to avoid being around people who are sick. If  you have not had the measles or chickenpox vaccines, tell your care team right away if you are around someone with these viruses. You will be tested for tuberculosis (TB) before you start this medication. If your care team  prescribes any medication for TB, you should start taking the TB medication before starting this medication. Make sure to finish the full course of TB medication. Avoid taking medications that contain aspirin, acetaminophen, ibuprofen, naproxen, or ketoprofen unless instructed by your care team. These medications may hide fever. Talk to your care team about your risk of cancer. You may be more at risk for certain types of cancer if you take this medication. This medication can decrease the response to a vaccine. If you need to get vaccinated, tell your care team if you have received this medication. Extra booster doses may be needed. Talk to your care team to see if a different vaccination schedule is needed. What side effects may I notice from receiving this medication? Side effects that you should report to your care team as soon as possible: Allergic reactions--skin rash, itching, hives, swelling of the face, lips, tongue, or throat Body pain, tingling, or numbness Eye pain, change in vision, vision loss Heart failure--shortness of breath, swelling of the ankles, feet, or hands, sudden weight gain, unusual weakness or fatigue Infection--fever, chills, cough, sore throat, wounds that don't heal, pain or trouble when passing urine, general feeling of discomfort or being unwell Liver injury--right upper belly pain, loss of appetite, nausea, light-colored stool, dark yellow or brown urine, yellowing skin or eyes, unusual weakness or fatigue Low red blood cell level--unusual weakness or fatigue, dizziness, headache, trouble breathing Lupus-like syndrome--joint pain, swelling, or stiffness, butterfly-shaped rash on the face, rashes that get worse in the sun, fever, unusual weakness or fatigue New or worsening psoriasis--rash with itchy, scaly patches Seizures Unusual bruising or bleeding Weakness in arms and legs Side effects that usually do not require medical attention (report to your care team if  they continue or are bothersome): Headache Pain, redness, or irritation at injection site Sinus pain or pressure around the face or forehead This list may not describe all possible side effects. Call your doctor for medical advice about side effects. You may report side effects to FDA at 1-800-FDA-1088. Where should I keep my medication? Keep out of the reach of children and pets. See product for storage information. Each product may have different instructions. Get rid of any unused medication after the expiration date. To get rid of medications that are no longer needed or have expired: Take the medication to a medication take-back program. Check with your pharmacy or law enforcement to find a location. If you cannot return the medication, ask your pharmacist or care team how to get rid of this medication safely. NOTE: This sheet is a summary. It may not cover all possible information. If you have questions about this medicine, talk to your doctor, pharmacist, or health care provider.  2024 Elsevier/Gold Standard (2022-03-15 00:00:00)

## 2024-05-07 ENCOUNTER — Encounter (HOSPITAL_COMMUNITY): Payer: Self-pay | Admitting: Emergency Medicine

## 2024-05-07 ENCOUNTER — Telehealth: Payer: Self-pay

## 2024-05-07 ENCOUNTER — Emergency Department (HOSPITAL_COMMUNITY)

## 2024-05-07 ENCOUNTER — Observation Stay (HOSPITAL_COMMUNITY)
Admission: EM | Admit: 2024-05-07 | Discharge: 2024-05-08 | Disposition: A | Discharge status: Home / Self Care | Attending: Student | Admitting: Student

## 2024-05-07 ENCOUNTER — Other Ambulatory Visit: Payer: Self-pay

## 2024-05-07 ENCOUNTER — Ambulatory Visit: Payer: Self-pay | Admitting: Internal Medicine

## 2024-05-07 DIAGNOSIS — E782 Mixed hyperlipidemia: Secondary | ICD-10-CM | POA: Diagnosis not present

## 2024-05-07 DIAGNOSIS — Z743 Need for continuous supervision: Secondary | ICD-10-CM | POA: Diagnosis not present

## 2024-05-07 DIAGNOSIS — F411 Generalized anxiety disorder: Secondary | ICD-10-CM | POA: Diagnosis not present

## 2024-05-07 DIAGNOSIS — F17200 Nicotine dependence, unspecified, uncomplicated: Secondary | ICD-10-CM | POA: Diagnosis not present

## 2024-05-07 DIAGNOSIS — E871 Hypo-osmolality and hyponatremia: Principal | ICD-10-CM | POA: Insufficient documentation

## 2024-05-07 DIAGNOSIS — M316 Other giant cell arteritis: Secondary | ICD-10-CM | POA: Diagnosis not present

## 2024-05-07 DIAGNOSIS — R519 Headache, unspecified: Principal | ICD-10-CM

## 2024-05-07 DIAGNOSIS — Z79899 Other long term (current) drug therapy: Secondary | ICD-10-CM

## 2024-05-07 DIAGNOSIS — M0579 Rheumatoid arthritis with rheumatoid factor of multiple sites without organ or systems involvement: Secondary | ICD-10-CM

## 2024-05-07 DIAGNOSIS — M069 Rheumatoid arthritis, unspecified: Secondary | ICD-10-CM

## 2024-05-07 DIAGNOSIS — Z8673 Personal history of transient ischemic attack (TIA), and cerebral infarction without residual deficits: Secondary | ICD-10-CM | POA: Insufficient documentation

## 2024-05-07 DIAGNOSIS — K2930 Chronic superficial gastritis without bleeding: Secondary | ICD-10-CM | POA: Diagnosis not present

## 2024-05-07 DIAGNOSIS — R5381 Other malaise: Secondary | ICD-10-CM | POA: Diagnosis not present

## 2024-05-07 DIAGNOSIS — M150 Primary generalized (osteo)arthritis: Secondary | ICD-10-CM | POA: Diagnosis not present

## 2024-05-07 DIAGNOSIS — K2950 Unspecified chronic gastritis without bleeding: Secondary | ICD-10-CM | POA: Diagnosis present

## 2024-05-07 DIAGNOSIS — Z7982 Long term (current) use of aspirin: Secondary | ICD-10-CM | POA: Diagnosis not present

## 2024-05-07 DIAGNOSIS — I509 Heart failure, unspecified: Secondary | ICD-10-CM | POA: Insufficient documentation

## 2024-05-07 DIAGNOSIS — I110 Hypertensive heart disease with heart failure: Secondary | ICD-10-CM | POA: Diagnosis not present

## 2024-05-07 DIAGNOSIS — D509 Iron deficiency anemia, unspecified: Secondary | ICD-10-CM | POA: Insufficient documentation

## 2024-05-07 DIAGNOSIS — I10 Essential (primary) hypertension: Secondary | ICD-10-CM

## 2024-05-07 DIAGNOSIS — E7800 Pure hypercholesterolemia, unspecified: Secondary | ICD-10-CM | POA: Diagnosis present

## 2024-05-07 DIAGNOSIS — I959 Hypotension, unspecified: Secondary | ICD-10-CM | POA: Insufficient documentation

## 2024-05-07 DIAGNOSIS — G4489 Other headache syndrome: Secondary | ICD-10-CM | POA: Diagnosis not present

## 2024-05-07 DIAGNOSIS — M359 Systemic involvement of connective tissue, unspecified: Secondary | ICD-10-CM

## 2024-05-07 LAB — CBC WITH DIFFERENTIAL/PLATELET
Abs Immature Granulocytes: 0.06 K/uL (ref 0.00–0.07)
Basophils Absolute: 0.1 K/uL (ref 0.0–0.1)
Basophils Relative: 1 %
Eosinophils Absolute: 0.1 K/uL (ref 0.0–0.5)
Eosinophils Relative: 1 %
HCT: 30.3 % — ABNORMAL LOW (ref 36.0–46.0)
Hemoglobin: 10 g/dL — ABNORMAL LOW (ref 12.0–15.0)
Immature Granulocytes: 1 %
Lymphocytes Relative: 16 %
Lymphs Abs: 1.8 K/uL (ref 0.7–4.0)
MCH: 29.2 pg (ref 26.0–34.0)
MCHC: 33 g/dL (ref 30.0–36.0)
MCV: 88.6 fL (ref 80.0–100.0)
Monocytes Absolute: 0.5 K/uL (ref 0.1–1.0)
Monocytes Relative: 5 %
Neutro Abs: 8.6 K/uL — ABNORMAL HIGH (ref 1.7–7.7)
Neutrophils Relative %: 76 %
Platelets: 318 K/uL (ref 150–400)
RBC: 3.42 MIL/uL — ABNORMAL LOW (ref 3.87–5.11)
RDW: 17.8 % — ABNORMAL HIGH (ref 11.5–15.5)
WBC: 11.1 K/uL — ABNORMAL HIGH (ref 4.0–10.5)
nRBC: 0 % (ref 0.0–0.2)

## 2024-05-07 LAB — COMPREHENSIVE METABOLIC PANEL WITH GFR
ALT: 13 U/L (ref 0–44)
AST: 28 U/L (ref 15–41)
Albumin: 3.6 g/dL (ref 3.5–5.0)
Alkaline Phosphatase: 96 U/L (ref 38–126)
Anion gap: 11 (ref 5–15)
BUN: 10 mg/dL (ref 8–23)
CO2: 21 mmol/L — ABNORMAL LOW (ref 22–32)
Calcium: 8.5 mg/dL — ABNORMAL LOW (ref 8.9–10.3)
Chloride: 93 mmol/L — ABNORMAL LOW (ref 98–111)
Creatinine, Ser: 0.49 mg/dL (ref 0.44–1.00)
GFR, Estimated: >60 mL/min (ref >60)
Glucose, Bld: 84 mg/dL (ref 70–99)
Potassium: 4.1 mmol/L (ref 3.5–5.1)
Sodium: 125 mmol/L — ABNORMAL LOW (ref 135–145)
Total Bilirubin: 0.7 mg/dL (ref 0.0–1.2)
Total Protein: 7 g/dL (ref 6.5–8.1)

## 2024-05-07 LAB — C-REACTIVE PROTEIN
CRP: 0.5 mg/dL (ref <1.0)
CRP: 0.8 mg/dL (ref <1.0)

## 2024-05-07 LAB — HIV ANTIBODY (ROUTINE TESTING W REFLEX): HIV Screen 4th Generation wRfx: NONREACTIVE

## 2024-05-07 LAB — MAGNESIUM: Magnesium: 1.9 mg/dL (ref 1.7–2.4)

## 2024-05-07 LAB — CK TOTAL AND CKMB (NOT AT ARMC)
CK, MB: 4.3 ng/mL (ref 0.5–5.0)
Total CK: 59 U/L (ref 38–234)

## 2024-05-07 LAB — SODIUM: Sodium: 125 mmol/L — ABNORMAL LOW (ref 135–145)

## 2024-05-07 LAB — TSH: TSH: 2.509 u[IU]/mL (ref 0.350–4.500)

## 2024-05-07 LAB — SEDIMENTATION RATE: Sed Rate: 51 mm/h — ABNORMAL HIGH (ref 0–30)

## 2024-05-07 LAB — OSMOLALITY: Osmolality: 263 mosm/kg — ABNORMAL LOW (ref 275–295)

## 2024-05-07 LAB — PHOSPHORUS: Phosphorus: 2.3 mg/dL — ABNORMAL LOW (ref 2.5–4.6)

## 2024-05-07 MED ORDER — TRAZODONE HCL 50 MG PO TABS: 25.0000 mg | ORAL_TABLET | Freq: Every evening | ORAL | Status: DC | PRN

## 2024-05-07 MED ORDER — SODIUM CHLORIDE 0.9 % IV BOLUS
1000.0000 mL | Freq: Once | INTRAVENOUS | Status: AC
  Administered 2024-05-07: 1000 mL via INTRAVENOUS

## 2024-05-07 MED ORDER — ASPIRIN 81 MG PO TBEC
81.0000 mg | DELAYED_RELEASE_TABLET | Freq: Every day | ORAL | Status: DC
  Administered 2024-05-08: 81 mg via ORAL
  Filled 2024-05-07: qty 1

## 2024-05-07 MED ORDER — ONDANSETRON HCL 4 MG/2ML IJ SOLN
4.0000 mg | Freq: Four times a day (QID) | INTRAMUSCULAR | Status: DC | PRN
  Administered 2024-05-07: 4 mg via INTRAVENOUS
  Filled 2024-05-07: qty 2

## 2024-05-07 MED ORDER — ONDANSETRON HCL 4 MG PO TABS: 4.0000 mg | ORAL_TABLET | Freq: Four times a day (QID) | ORAL | Status: DC | PRN

## 2024-05-07 MED ORDER — IBUPROFEN 200 MG PO TABS: 600.0000 mg | ORAL_TABLET | Freq: Four times a day (QID) | ORAL | Status: DC | PRN

## 2024-05-07 MED ORDER — DIPHENHYDRAMINE HCL 50 MG/ML IJ SOLN
12.5000 mg | Freq: Once | INTRAMUSCULAR | Status: AC
  Administered 2024-05-07: 12.5 mg via INTRAVENOUS
  Filled 2024-05-07: qty 1

## 2024-05-07 MED ORDER — HYDROXYCHLOROQUINE SULFATE 200 MG PO TABS
200.0000 mg | ORAL_TABLET | ORAL | Status: DC
  Administered 2024-05-07: 200 mg via ORAL
  Filled 2024-05-07: qty 1

## 2024-05-07 MED ORDER — FOLIC ACID 1 MG PO TABS
1.0000 mg | ORAL_TABLET | Freq: Every day | ORAL | Status: DC
  Administered 2024-05-08: 1 mg via ORAL
  Filled 2024-05-07: qty 1

## 2024-05-07 MED ORDER — METOCLOPRAMIDE HCL 5 MG/ML IJ SOLN
10.0000 mg | Freq: Once | INTRAMUSCULAR | Status: AC
  Administered 2024-05-07: 10 mg via INTRAVENOUS
  Filled 2024-05-07: qty 2

## 2024-05-07 MED ORDER — IPRATROPIUM BROMIDE 0.02 % IN SOLN: 0.5000 mg | Freq: Four times a day (QID) | RESPIRATORY_TRACT | Status: DC | PRN

## 2024-05-07 MED ORDER — HEPARIN SODIUM (PORCINE) 5000 UNIT/ML IJ SOLN
5000.0000 [IU] | Freq: Three times a day (TID) | INTRAMUSCULAR | Status: DC
  Filled 2024-05-07: qty 1

## 2024-05-07 MED ORDER — FENTANYL CITRATE (PF) 100 MCG/2ML IJ SOLN: 25.0000 ug | INTRAMUSCULAR | Status: DC | PRN

## 2024-05-07 MED ORDER — DOCUSATE SODIUM 100 MG PO CAPS
200.0000 mg | ORAL_CAPSULE | Freq: Every day | ORAL | Status: DC
  Administered 2024-05-08: 200 mg via ORAL
  Filled 2024-05-07: qty 2

## 2024-05-07 MED ORDER — HYDROXYZINE HCL 25 MG PO TABS: 25.0000 mg | ORAL_TABLET | Freq: Four times a day (QID) | ORAL | Status: DC | PRN

## 2024-05-07 MED ORDER — SODIUM CHLORIDE 0.9% FLUSH
3.0000 mL | Freq: Two times a day (BID) | INTRAVENOUS | Status: DC
  Administered 2024-05-08: 3 mL via INTRAVENOUS

## 2024-05-07 MED ORDER — DIPHENHYDRAMINE HCL 50 MG/ML IJ SOLN: 12.5000 mg | Freq: Four times a day (QID) | INTRAMUSCULAR | Status: DC | PRN

## 2024-05-07 MED ORDER — ACETAMINOPHEN 325 MG PO TABS: 650.0000 mg | ORAL_TABLET | Freq: Four times a day (QID) | ORAL | Status: DC | PRN

## 2024-05-07 MED ORDER — SUCRALFATE 1 GM/10ML PO SUSP: 1.0000 g | Freq: Two times a day (BID) | ORAL | Status: DC | PRN

## 2024-05-07 MED ORDER — ATORVASTATIN CALCIUM 10 MG PO TABS
10.0000 mg | ORAL_TABLET | Freq: Every day | ORAL | Status: DC
  Administered 2024-05-08: 10 mg via ORAL
  Filled 2024-05-07: qty 1

## 2024-05-07 MED ORDER — LIDOCAINE 5 % EX PTCH
1.0000 | MEDICATED_PATCH | CUTANEOUS | Status: DC
  Administered 2024-05-08: 1 via TRANSDERMAL
  Filled 2024-05-07: qty 1

## 2024-05-07 MED ORDER — ALPRAZOLAM 0.25 MG PO TABS
0.2500 mg | ORAL_TABLET | Freq: Two times a day (BID) | ORAL | Status: DC | PRN
  Administered 2024-05-07: 0.25 mg via ORAL
  Filled 2024-05-07: qty 1

## 2024-05-07 MED ORDER — OXYCODONE HCL 5 MG PO TABS: 5.0000 mg | ORAL_TABLET | ORAL | Status: DC | PRN

## 2024-05-07 MED ORDER — PAROXETINE HCL 20 MG PO TABS
20.0000 mg | ORAL_TABLET | Freq: Every day | ORAL | Status: DC
  Administered 2024-05-08: 20 mg via ORAL
  Filled 2024-05-07: qty 1

## 2024-05-07 MED ORDER — LEVALBUTEROL HCL 0.63 MG/3ML IN NEBU: 0.6300 mg | INHALATION_SOLUTION | Freq: Four times a day (QID) | RESPIRATORY_TRACT | Status: DC | PRN

## 2024-05-07 MED ORDER — TRAZODONE HCL 50 MG PO TABS
100.0000 mg | ORAL_TABLET | Freq: Every evening | ORAL | Status: DC | PRN
  Administered 2024-05-07: 100 mg via ORAL
  Filled 2024-05-07: qty 2

## 2024-05-07 MED ORDER — PREDNISONE 20 MG PO TABS
60.0000 mg | ORAL_TABLET | Freq: Every day | ORAL | Status: DC
  Administered 2024-05-08: 60 mg via ORAL
  Filled 2024-05-07: qty 3

## 2024-05-07 MED ORDER — SENNOSIDES-DOCUSATE SODIUM 8.6-50 MG PO TABS: 1.0000 | ORAL_TABLET | Freq: Every evening | ORAL | Status: DC | PRN

## 2024-05-07 MED ORDER — FLEET ENEMA RE ENEM: 1.0000 | ENEMA | Freq: Once | RECTAL | Status: DC | PRN

## 2024-05-07 MED ORDER — ACETAMINOPHEN 500 MG PO TABS
1000.0000 mg | ORAL_TABLET | Freq: Once | ORAL | Status: AC
  Administered 2024-05-07: 1000 mg via ORAL
  Filled 2024-05-07: qty 2

## 2024-05-07 MED ORDER — MEGESTROL ACETATE 400 MG/10ML PO SUSP
400.0000 mg | Freq: Every day | ORAL | Status: DC
  Administered 2024-05-07 – 2024-05-08 (×2): 400 mg via ORAL
  Filled 2024-05-07 (×2): qty 10

## 2024-05-07 MED ORDER — PANTOPRAZOLE SODIUM 40 MG PO TBEC
40.0000 mg | DELAYED_RELEASE_TABLET | Freq: Two times a day (BID) | ORAL | Status: DC
  Administered 2024-05-07 – 2024-05-08 (×2): 40 mg via ORAL
  Filled 2024-05-07 (×2): qty 1

## 2024-05-07 MED ORDER — HYDRALAZINE HCL 20 MG/ML IJ SOLN: 10.0000 mg | INTRAMUSCULAR | Status: DC | PRN

## 2024-05-07 MED ORDER — SODIUM CHLORIDE 0.9 % IV SOLN: INTRAVENOUS | Status: AC

## 2024-05-07 MED ORDER — HYDROMORPHONE HCL 1 MG/ML IJ SOLN: 0.5000 mg | INTRAMUSCULAR | Status: DC | PRN

## 2024-05-07 MED ORDER — ACETAMINOPHEN 650 MG RE SUPP
650.0000 mg | Freq: Four times a day (QID) | RECTAL | Status: DC | PRN
Start: 2024-05-07 — End: 2024-05-08

## 2024-05-07 MED ORDER — BISACODYL 5 MG PO TBEC: 5.0000 mg | DELAYED_RELEASE_TABLET | Freq: Every day | ORAL | Status: DC | PRN

## 2024-05-07 MED ORDER — PREDNISONE 20 MG PO TABS
40.0000 mg | ORAL_TABLET | Freq: Once | ORAL | Status: AC
  Administered 2024-05-07: 10 mg via ORAL
  Filled 2024-05-07: qty 2

## 2024-05-07 NOTE — ED Notes (Signed)
Pt is aware urine sample is needed. 

## 2024-05-07 NOTE — ED Notes (Signed)
 Pt refuses to take full 40mg  of prednisone . Pt states she does not like the way it makes her feel. Pt requested to take only 10mg  of the dose. Pt given a total of 10mg  of prednisone  and EDP Dr.Paterson was made aware.

## 2024-05-07 NOTE — Hospital Course (Signed)
 Vanessa Lara is a 68 year old female with history of rheumatoid arthritis, stroke, without residual findings, HTN, HLD, presenting with chief complaint of headaches. Per patient headache onset has been gradual on the side of the head initially left side now radiated across the forehead and to the right temporal area.  Also reported may have some visual changes but denies have any visual loss.  Denies of any double vision, neck pain, stiffness, asymmetric weakness or change in her speech.  Denies having numbness and tingling.  Reporting chills but no fever.   ED Evaluation: Blood pressure (!) 98/42, pulse 85, temperature 98 F (36.7 C), temperature source Oral, resp. rate (!) 21, SpO2 94%.  Reporting intensity of her headache 10 /10, no relief from NSAIDs or muscle relaxants,   In the ED CT of the head was negative Labs: Sodium 125 (yesterday 134.  CRP 5.2, ESR 65 >> 51  EDP Dr. Jakie discussed the case with vascular surgeon Dr. Serene at [facility] who recommended patient to be admitted for evaluation and possibly biopsy  Will admit the patient to [facility] for hyponatremia, ruling out temporal arteritis Oral prednisone  was initiated

## 2024-05-07 NOTE — Consult Note (Addendum)
 [facility] [place]  Patient name: Vanessa Lara MRN: [id number] DOB: 1956/06/25 Sex: female   REQUESTING PROVIDER:    Hospital service   REASON FOR CONSULT:    Temporal artery biopsy  HISTORY OF PRESENT ILLNESS:   Vanessa Lara is a 68 y.o. female, who presented to the [facility] emergency department with headaches.  This was initially on the left temple region but now radiates across her forehead to the right side.  She does report some possible visual changes.  She denies any new weakness.  She will occasionally get numb toes and fingers.  She does have a history of rheumatoid arthritis as well as collagen vascular disease.  She also has a history of stroke.  We have been asked to perform temporal artery biopsy to rule out temporal arteritis.  Her CRP was 5.2 and her sed rate was 51  The patient is an occasional smoker she takes a statin for hypercholesterolemia.  She is medically managed for hypertension  PAST MEDICAL HISTORY    Past Medical History:  Diagnosis Date   Anemia    Anxiety    Arthritis    RA   Bursitis    Cataract    CHF (congestive heart failure) (HCC)    Collagen vascular disease (HCC)    Depression    Endometriosis    Heart murmur    High cholesterol    Hypertension    IBS (irritable bowel syndrome)    Lymphadenopathy    Mitral valve prolapse    Osteoporosis    RA (rheumatoid arthritis) (HCC)    Rotator cuff tear    bilateral   Stroke (HCC) 06/2022   Uterus, adenomyosis      FAMILY HISTORY   Family History  Problem Relation Age of Onset   Bone cancer Mother    Brain cancer Father    Brain cancer Sister    Breast cancer Sister    Renal Disease Sister    Pancreatic cancer Neg Hx    Esophageal cancer Neg Hx    Stomach cancer Neg Hx    Rectal cancer Neg Hx    Colon polyps Neg Hx     SOCIAL HISTORY:   Social History   Socioeconomic History   Marital status: Single     Spouse name: Not on file   Number of children: Not on file   Years of education: Not on file   Highest education level: Not on file  Occupational History   Occupation: retired  Tobacco Use   Smoking status: Some Days    Current packs/day: 0.00    Average packs/day: 0.5 packs/day for 33.0 years (16.5 ttl pk-yrs)    Types: Cigarettes    Start date: 80    Last attempt to quit: 2013    Years since quitting: 12.6   Smokeless tobacco: Never  Vaping Use   Vaping status: Never Used  Substance and Sexual Activity   Alcohol use: No    Alcohol/week: 0.0 standard drinks of alcohol   Drug use: No   Sexual activity: Not Currently  Other Topics Concern   Not on file  Social History Narrative   Not on file   Social Drivers of Health   Financial Resource Strain: Low Risk  (11/14/2022)   Received from [facility]   Overall Financial Resource Strain (CARDIA)    Difficulty of Paying Living Expenses: Not hard at all  Food Insecurity: No Food Insecurity (11/14/2022)   Received  from [facility]   Hunger Vital Sign    Within the past 12 months, you worried that your food would run out before you got the money to buy more.: Never true    Within the past 12 months, the food you bought just didn't last and you didn't have money to get more.: Never true  Transportation Needs: No Transportation Needs (11/14/2022)   Received from [facility]   PRAPARE - Transportation    Lack of Transportation (Medical): No    Lack of Transportation (Non-Medical): No  Physical Activity: Unknown (11/14/2022)   Received from [facility]   Exercise Vital Sign    On average, how many days per week do you engage in moderate to strenuous exercise (like a brisk walk)?: 0 days    Minutes of Exercise per Session: Not on file  Stress: Stress Concern Present (11/14/2022)   Received from [facility] - Occupational Stress Questionnaire    Feeling of Stress : Very much   Social Connections: Somewhat Isolated (11/14/2022)   Received from [facility]   Social Network    How would you rate your social network (family, work, friends)?: Restricted participation with some degree of social isolation  Intimate Partner Violence: Not At Risk (11/14/2022)   Received from [facility]   HITS    Over the last 12 months how often did your partner physically hurt you?: Never    Over the last 12 months how often did your partner insult you or talk down to you?: Never    Over the last 12 months how often did your partner threaten you with physical harm?: Never    Over the last 12 months how often did your partner scream or curse at you?: Never    ALLERGIES:    Allergies  Allergen Reactions   Abatacept  Other (See Comments)   Codeine  Other (See Comments)    Stomach pains/constipation- due to IBS   Golimumab  Other (See Comments)   Hydrocodone  Other (See Comments)    She does not want to take this due to intolerance to codeine .   Methylprednisolone  Diarrhea and Other (See Comments)    Made my IBS flare up  Diarrhea, nausea   Methotrexate Derivatives Other (See Comments)    Agitation; pt stated, I get no sleep; one dose caused no sleep for 7 days and 7 nights - that was when I got a 0.5 injection   Prednisone  Rash    Cannot take prednisone  by mouth ---- red rash all over  Patient states she got a rash one time   Patient states she can take regular prednisone  but not the packs.    CURRENT MEDICATIONS:    Current Facility-Administered Medications  Medication Dose Route Frequency Provider Last Rate Last Admin   0.9 %  sodium chloride  infusion   Intravenous Continuous Shahmehdi, Seyed A, MD 100 mL/hr at 05/07/24 1354 New Bag at 05/07/24 1354   acetaminophen  (TYLENOL ) tablet 650 mg  650 mg Oral Q6H PRN Shahmehdi, Adriana LABOR, MD       Or   acetaminophen  (TYLENOL ) suppository 650 mg  650 mg Rectal Q6H PRN Shahmehdi, Seyed A, MD       ALPRAZolam  (XANAX ) tablet  0.25 mg  0.25 mg Oral BID PRN Shahmehdi, Adriana LABOR, MD       [START ON 05/08/2024] aspirin  EC tablet 81 mg  81 mg Oral Daily Shahmehdi, Adriana LABOR, MD       NOREEN  ON 05/08/2024] atorvastatin  (LIPITOR) tablet 10 mg  10 mg Oral Daily Shahmehdi, Seyed A, MD       bisacodyl  (DULCOLAX) EC tablet 5 mg  5 mg Oral Daily PRN Willette Adriana LABOR, MD       [START ON 05/08/2024] docusate sodium  (COLACE) capsule 200 mg  200 mg Oral Daily Shahmehdi, Seyed A, MD       fentaNYL  (SUBLIMAZE ) injection 25 mcg  25 mcg Intravenous Q2H PRN Shahmehdi, Adriana LABOR, MD       [START ON 05/08/2024] folic acid  (FOLVITE ) tablet 1 mg  1 mg Oral Daily Shahmehdi, Seyed A, MD       heparin  injection 5,000 Units  5,000 Units Subcutaneous Q8H Shahmehdi, Seyed A, MD       hydrALAZINE  (APRESOLINE ) injection 10 mg  10 mg Intravenous Q4H PRN Shahmehdi, Seyed A, MD       hydrOXYzine  (ATARAX ) tablet 25 mg  25 mg Oral Q6H PRN Shahmehdi, Seyed A, MD       ibuprofen  (ADVIL ) tablet 600 mg  600 mg Oral Q6H PRN Shahmehdi, Seyed A, MD       ipratropium (ATROVENT ) nebulizer solution 0.5 mg  0.5 mg Nebulization Q6H PRN Shahmehdi, Seyed A, MD       levalbuterol  (XOPENEX ) nebulizer solution 0.63 mg  0.63 mg Nebulization Q6H PRN Shahmehdi, Seyed A, MD       lidocaine  (LIDODERM ) 5 % 1 patch  1 patch Transdermal Q24H Shahmehdi, Seyed A, MD       megestrol  (MEGACE ) 400 MG/10ML suspension 400 mg  400 mg Oral Daily Shahmehdi, Seyed A, MD   400 mg at 05/07/24 1719   ondansetron  (ZOFRAN ) tablet 4 mg  4 mg Oral Q6H PRN Shahmehdi, Adriana LABOR, MD       Or   ondansetron  (ZOFRAN ) injection 4 mg  4 mg Intravenous Q6H PRN Shahmehdi, Seyed A, MD   4 mg at 05/07/24 1356   oxyCODONE  (Oxy IR/ROXICODONE ) immediate release tablet 5 mg  5 mg Oral Q4H PRN Shahmehdi, Seyed A, MD       pantoprazole  (PROTONIX ) EC tablet 40 mg  40 mg Oral BID Shahmehdi, Adriana LABOR, MD       [START ON 05/08/2024] PARoxetine  (PAXIL ) tablet 20 mg  20 mg Oral Daily Shahmehdi, Seyed A, MD       [START ON 05/08/2024]  predniSONE  (DELTASONE ) tablet 60 mg  60 mg Oral Q breakfast Shahmehdi, Seyed A, MD       senna-docusate (Senokot-S) tablet 1 tablet  1 tablet Oral QHS PRN Shahmehdi, Seyed A, MD       sodium chloride  flush (NS) 0.9 % injection 3 mL  3 mL Intravenous Q12H Shahmehdi, Seyed A, MD       sodium chloride  flush (NS) 0.9 % injection 3 mL  3 mL Intravenous Q12H Shahmehdi, Seyed A, MD       sodium phosphate  (FLEET) enema 1 enema  1 enema Rectal Once PRN Shahmehdi, Seyed A, MD       sucralfate  (CARAFATE ) 1 GM/10ML suspension 1 g  1 g Oral BID BM & HS PRN Shahmehdi, Seyed A, MD       traZODone  (DESYREL ) tablet 100 mg  100 mg Oral QHS PRN Shahmehdi, Seyed A, MD       Facility-Administered Medications Ordered in Other Encounters  Medication Dose Route Frequency Provider Last Rate Last Admin   ferumoxytol  (FERAHEME ) 510 mg in sodium chloride  0.9 % 100 mL IVPB  510 mg Intravenous Once Pennington,  Pleasant HERO, PA-C        REVIEW OF SYSTEMS:   [X]  denotes positive finding, [ ]  denotes negative finding Cardiac  Comments:  Chest pain or chest pressure:    Shortness of breath upon exertion:    Short of breath when lying flat:    Irregular heart rhythm:        Vascular    Pain in calf, thigh, or hip brought on by ambulation:    Pain in feet at night that wakes you up from your sleep:     Blood clot in your veins:    Leg swelling:         Pulmonary    Oxygen at home:    Productive cough:     Wheezing:         Neurologic    Sudden weakness in arms or legs:     Sudden numbness in arms or legs:     Sudden onset of difficulty speaking or slurred speech:    Temporary loss of vision in one eye:     Problems with dizziness:         Gastrointestinal    Blood in stool:      Vomited blood:         Genitourinary    Burning when urinating:     Blood in urine:        Psychiatric    Major depression:         Hematologic    Bleeding problems:    Problems with blood clotting too easily:        Skin     Rashes or ulcers:        Constitutional    Fever or chills:     PHYSICAL EXAM:   Vitals:   05/07/24 1311 05/07/24 1415 05/07/24 1430 05/07/24 1720  BP:  (!) 108/59 (!) 96/53 (!) 103/56  Pulse:  83 83 81  Resp:  17 15 16   Temp:    97.8 F (36.6 C)  TempSrc:      SpO2: 95% 96% 98% 97%    GENERAL: The patient is a well-nourished female, in no acute distress. The vital signs are documented above. CARDIAC: There is a regular rate and rhythm.  VASCULAR: Palpable left temporal pulse PULMONARY: Nonlabored respirations  NEUROLOGIC: No focal weakness or paresthesias are detected. SKIN: There are no ulcers or rashes noted. PSYCHIATRIC: The patient has a normal affect.  STUDIES:     ASSESSMENT and PLAN   Rule out temporal arteritis: I discussed with the patient that we need to proceed with a temporal artery biopsy to determine whether or not she has temporal arteritis so her treatment can be tailored as necessary.  I discussed the details of the procedure including the possibility of shaving part of her hair.  All of her questions were answered.  This will be done by one of my partners tomorrow.  She will be n.p.o. after midnight.  Despite her having bilateral symptoms, her vision issues and the most severe side is the left.  We will only do 1 side which will be the left   Malvina New, IV, MD, FACS [facility] [phone] Pager [phone]

## 2024-05-07 NOTE — ED Notes (Signed)
[facility] has been called.  

## 2024-05-07 NOTE — ED Triage Notes (Signed)
 Pt c/o migraine x couple of weeks and states she is scared she had a stroke, stroke screen negative per EMS, cbg 92, rates HA 10/10 and has a Hx of migraines

## 2024-05-07 NOTE — Assessment & Plan Note (Signed)
 Extensive history of rheumatoid arthritis, -Home analgesics including fentanyl  patch, lidocaine , Voltaren  gel, -on Plaquenil 

## 2024-05-07 NOTE — Assessment & Plan Note (Signed)
-   Blood pressure running soft, currently 98/42 = Monitoring closely -Continue IV fluid with normal saline -We are holding home medications including diuretics

## 2024-05-07 NOTE — Progress Notes (Signed)
 Upon arrival to room, pt very frustrated saying she wants to be unhooked from her continuous IV infusion and she would like to sign herself out to discharge. Dr. Franky & charge RN notified. AMA paperwork placed at bedside. She stated she would consider staying if a physician would come to the bedside to discuss and explain her plan of care, most specifically the biopsy they want her to get tomorrow or the next day. Pt currently disconnected from continuous IV infusion per her request.  Dr. Franky spoke to pt via telephone call, she decided to stay to have biopsy performed tomorrow.  Pt refusing fall precautions, refuses bed alarm. Does have non-skid socks on. Witnessed pt ambulating independently with IV pole at start of shift, however she has had episodes of dizziness prior to this admission. Extensively educated pt on the risks associated with a fall and refusing fall precautions. Pt verbalizes understanding and continues to refuse fall precautions. Currently pt denies any dizziness. She states if she feels any dizziness or lightheadedness she will call staff for assistance.

## 2024-05-07 NOTE — Progress Notes (Signed)
 Sedimentation rate remains high at 65 a little higher than 58 last time.  Metabolic panel was effectively normal her glucose was at 72 which is also normal for a fasting value.

## 2024-05-07 NOTE — ED Notes (Signed)
 Transition of Care [facility]) - Inpatient Brief Assessment   Patient Details  Name: Vanessa Lara MRN: [id number] Date of Birth: 12-02-1955  Transition of [facility]) CM/SW Contact:    [name] Phone Number: 05/07/2024, 11:39 AM   Clinical Narrative:   Transition of Care Department [facility]) has reviewed patient and no TOC needs have been identified at this time. We will continue to monitor patient advancement through interdisciplinary progression rounds. If new patient transition needs arise, please place a TOC consult.  Transition of Care Asessment: Insurance and Status: Insurance coverage has been reviewed Patient has primary care physician: Yes Home environment has been reviewed: [facility] Prior level of function:: Independent Prior/Current Home Services: No current home services Social Drivers of Health Review: SDOH reviewed no interventions necessary Readmission risk has been reviewed: Yes Transition of care needs: no transition of care needs at this time

## 2024-05-07 NOTE — ED Notes (Signed)
 Patient given a sandwich and cup of water 

## 2024-05-07 NOTE — Assessment & Plan Note (Signed)
-   Starting PT OT for evaluation recommendations

## 2024-05-07 NOTE — H&P (Signed)
 History and Physical   Patient: Vanessa Lara                            PCP: Suanne Pfeiffer, NP                    DOB: December 06, 1955            DOA: 05/07/2024 FMW:[id number]             DOS: 05/07/2024, 1:07 PM  Suanne Pfeiffer, NP  Patient coming from:   HOME  I have personally reviewed patient's medical records, in electronic medical records, including:  [place] link, and care everywhere.    Chief Complaint:   Chief Complaint  Patient presents with   Headache    History of present illness:    Vanessa Lara is a 68 year old female with history of rheumatoid arthritis, stroke, without residual findings, HTN, HLD, presenting with chief complaint of headaches. Per patient headache onset has been gradual on the side of the head initially left side now radiated across the forehead and to the right temporal area.  Also reported may have some visual changes but denies have any visual loss.  Denies of any double vision, neck pain, stiffness, asymmetric weakness or change in her speech.  Denies having numbness and tingling.  Reporting chills but no fever.   ED Evaluation: Blood pressure (!) 98/42, pulse 85, temperature 98 F (36.7 C), temperature source Oral, resp. rate (!) 21, SpO2 94%.  Reporting intensity of her headache 10 /10, no relief from NSAIDs or muscle relaxants,   In the ED CT of the head was negative Labs: Sodium 125 (yesterday 134.  CRP 5.2, ESR 65 >> 51  EDP Dr. Jakie discussed the case with vascular surgeon Dr. Serene at [facility] who recommended patient to be admitted for evaluation and possibly biopsy  Will admit the patient to [facility] for hyponatremia, ruling out temporal arteritis Oral prednisone  was initiated    Patient Denies having: Fever, Chills, Cough, SOB, Chest Pain, Abd pain, N/V/D, headache, dizziness, lightheadedness,  Dysuria, Joint pain, rash, open wounds     Review of Systems: As per HPI, otherwise 10 point review of systems were negative.    ----------------------------------------------------------------------------------------------------------------------  Allergies  Allergen Reactions   Abatacept  Other (See Comments)   Codeine  Other (See Comments)    Stomach pains/constipation- due to IBS   Golimumab  Other (See Comments)   Hydrocodone  Other (See Comments)    She does not want to take this due to intolerance to codeine .   Methylprednisolone  Diarrhea and Other (See Comments)    Made my IBS flare up  Diarrhea, nausea   Methotrexate Derivatives Other (See Comments)    Agitation; pt stated, I get no sleep; one dose caused no sleep for 7 days and 7 nights - that was when I got a 0.5 injection   Prednisone  Rash    Cannot take prednisone  by mouth ---- red rash all over  Patient states she got a rash one time   Patient states she can take regular prednisone  but not the packs.    Home MEDs:  Prior to Admission medications   Medication Sig Start Date End Date Taking? Authorizing Provider  acetaminophen  (TYLENOL ) 500 MG tablet Take 1,000 mg by mouth every 6 (six) hours as needed for moderate pain (pain score 4-6) or headache.   Yes [provider]  ALPRAZolam  (XANAX ) 0.25 MG tablet Take 0.25 mg  by mouth 2 (two) times daily as needed. 02/22/24  Yes [provider]  Ascorbic Acid (VITAMIN C PO) Take by mouth.   Yes [provider]  ASPIRIN  81 PO Take 1 tablet by mouth daily.   Yes [provider]  atorvastatin  (LIPITOR) 10 MG tablet Take by mouth. 09/17/23  Yes [provider]  Cholecalciferol (VITAMIN D3) 25 MCG (1000 UT) CAPS 1 capsule Orally Once a day for 30 day(s)   Yes [provider]  diclofenac  Sodium (VOLTAREN ) 1 % GEL SMARTSIG:Gram(s) Topical 4 Times Daily PRN 02/28/23  Yes [provider]  docusate sodium  (COLACE) 100 MG capsule TAKE 1 CAPSULE(100 MG) BY MOUTH TWICE DAILY 03/04/24  Yes Magnant, Charles L, PA-C  fentaNYL  (DURAGESIC ) 50 MCG/HR Place 1  patch onto the skin every other day. Patient states she does it every other day.   Yes [provider]  folic acid  (FOLVITE ) 1 MG tablet Take 1 mg by mouth daily.   Yes [provider]  furosemide  (LASIX ) 20 MG tablet Take 20 mg by mouth daily as needed. 10/07/21  Yes [provider]  hydroxychloroquine  (PLAQUENIL ) 200 MG tablet Take daily 5 days out of the week Patient taking differently: Take 200 mg by mouth See admin instructions. Take quarter tablet to 1 tablet Sat and Sunday. Monday-Friday 1 tablet daily 01/07/24  Yes Rice, Lonni ORN, MD  hydrOXYzine  (ATARAX ) 25 MG tablet Take 25 mg by mouth 2 (two) times daily. 01/17/24  Yes [provider]  lidocaine  (LIDODERM ) 5 % one patch daily. 10/06/21  Yes [provider]  MYRBETRIQ 50 MG TB24 tablet Take 50 mg by mouth daily. 01/17/24  Yes [provider]  naloxone TAMMY) nasal spray 4 mg/0.1 mL  09/15/21  Yes [provider]  pantoprazole  (PROTONIX ) 40 MG tablet TAKE 1 TABLET(40 MG) BY MOUTH TWICE DAILY 04/11/23  Yes Danis, Victory CROME III, MD  PARoxetine  (PAXIL ) 20 MG tablet Take 20 mg by mouth daily. 12/29/22  Yes [provider]  sucralfate  (CARAFATE ) 1 GM/10ML suspension SHAKE LIQUID AND TAKE 10 ML(1 GRAM) BY MOUTH FOUR TIMES DAILY 08/24/23  Yes [provider]  tiZANidine  (ZANAFLEX ) 4 MG tablet TAKE 1 TABLET(4 MG) BY MOUTH EVERY 8 HOURS AS NEEDED FOR MUSCLE SPASMS 05/01/24  Yes Rice, Lonni ORN, MD  traZODone  (DESYREL ) 100 MG tablet Take 1 tablet (100 mg total) by mouth at bedtime as needed for sleep. 01/25/21  Yes Hilts, Ozell, MD  Zoledronic  Acid (RECLAST  IV) Inject 5 mg into the vein. Once yearly   Yes Rice, Lonni ORN, MD  Heat Wraps [facility] BACK/HIP) MISC 1 application by Does not apply route daily. 10/27/20   Hilts, Ozell, MD    PRN MEDs: acetaminophen  **OR** acetaminophen , ALPRAZolam , bisacodyl , fentaNYL  (SUBLIMAZE ) injection, hydrALAZINE , hydrOXYzine ,  ibuprofen , ipratropium, levalbuterol , ondansetron  **OR** ondansetron  (ZOFRAN ) IV, oxyCODONE , senna-docusate, sodium phosphate , sucralfate , traZODone   Past Medical History:  Diagnosis Date   Anemia    Anxiety    Arthritis    RA   Bursitis    Cataract    CHF (congestive heart failure) (HCC)    Collagen vascular disease (HCC)    Depression    Endometriosis    Heart murmur    High cholesterol    Hypertension    IBS (irritable bowel syndrome)    Lymphadenopathy    Mitral valve prolapse    Osteoporosis    RA (rheumatoid arthritis) (HCC)    Rotator cuff tear    bilateral   Stroke (  HCC) 06/2022   Uterus, adenomyosis     Past Surgical History:  Procedure Laterality Date   AXILLARY LYMPH NODE BIOPSY Right 10/12/2020   Procedure: RIGHT AXILLARY LYMPH NODE BIOPSY EXCISION;  Surgeon: Aron Shoulders, MD;  Location:  SURGERY CENTER;  Service: General;  Laterality: Right;   BUNIONECTOMY Right    CARPAL TUNNEL RELEASE Right    COLONOSCOPY     COLONOSCOPY N/A 12/13/2015   Procedure: COLONOSCOPY;  Surgeon: Claudis RAYMOND Rivet, MD;  Location: [facility];  Service: Endoscopy;  Laterality: N/A;  9:55   Fatty tumor     2011 (left arm)   TONSILLECTOMY     TOTAL ABDOMINAL HYSTERECTOMY     2003     reports that she has been smoking cigarettes. She started smoking about 45 years ago. She has a 16.5 pack-year smoking history. She has never used smokeless tobacco. She reports that she does not drink alcohol and does not use drugs.   Family History  Problem Relation Age of Onset   Bone cancer Mother    Brain cancer Father    Brain cancer Sister    Breast cancer Sister    Renal Disease Sister    Pancreatic cancer Neg Hx    Esophageal cancer Neg Hx    Stomach cancer Neg Hx    Rectal cancer Neg Hx    Colon polyps Neg Hx     Physical Exam:   Vitals:   05/07/24 0727 05/07/24 0736 05/07/24 1045 05/07/24 1116  BP:  119/64 (!) 98/42   Pulse:  83 85   Resp:  13 (!) 21   Temp:  98.2 F (36.8 C)   98 F (36.7 C)  TempSrc: Oral   Oral  SpO2:  95% 94%    Constitutional: NAD, calm, comfortable, generalized cachexia, Eyes: PERRL, lids and conjunctivae normal ENMT: Mucous membranes are moist. Posterior pharynx clear of any exudate or lesions.Normal dentition.  Neck: normal, supple, no masses, no thyromegaly Respiratory: clear to auscultation bilaterally, no wheezing, no crackles. Normal respiratory effort. No accessory muscle use.  Cardiovascular: Regular rate and rhythm, no murmurs / rubs / gallops. No extremity edema. 2+ pedal pulses. No carotid bruits.  Abdomen: no tenderness, no masses palpated. No hepatosplenomegaly. Bowel sounds positive.  Musculoskeletal: Diffuse massive muscle atrophy - no clubbing / cyanosis. No joint deformity upper and lower extremities. Good ROM, no contractures. Normal muscle tone.  Neurologic: CN II-XII grossly intact. Sensation intact, DTR normal. Strength 5/5 in all 4.  Psychiatric: Normal judgment and insight. Alert and oriented x 3. Normal mood.  Skin: no rashes, lesions, ulcers. No induration   Labs on admission:    I have personally reviewed following labs and imaging studies  CBC: Recent Labs  Lab 05/07/24 0750  WBC 11.1*  NEUTROABS 8.6*  HGB 10.0*  HCT 30.3*  MCV 88.6  PLT 318   Basic Metabolic Panel: Recent Labs  Lab 05/06/24 1148 05/07/24 0750 05/07/24 0949  NA 134* 125* 125*  K 3.7 4.1  --   CL 98 93*  --   CO2 26 21*  --   GLUCOSE 72 84  --   BUN 9 10  --   CREATININE 0.56 0.49  --   CALCIUM  9.2 8.5*  --    GFR: Estimated Creatinine Clearance: 30.3 mL/min (by C-G formula based on SCr of 0.49 mg/dL). Liver Function Tests: Recent Labs  Lab 05/06/24 1148 05/07/24 0750  AST 20 28  ALT 11 13  ALKPHOS  --  96  BILITOT 0.3 0.7  PROT 7.3 7.0  ALBUMIN  --  3.6    Urine analysis:    Component Value Date/Time   COLORURINE STRAW (A) 10/24/2023 1327   APPEARANCEUR CLEAR 10/24/2023 1327   LABSPEC  1.002 (L) 10/24/2023 1327   PHURINE 7.0 10/24/2023 1327   GLUCOSEU NEGATIVE 10/24/2023 1327   HGBUR NEGATIVE 10/24/2023 1327   BILIRUBINUR NEGATIVE 10/24/2023 1327   KETONESUR NEGATIVE 10/24/2023 1327   PROTEINUR NEGATIVE 10/24/2023 1327   NITRITE NEGATIVE 10/24/2023 1327   LEUKOCYTESUR NEGATIVE 10/24/2023 1327    Last A1C:  Lab Results  Component Value Date   HGBA1C 5.1 11/02/2022     Radiologic Exams on Admission:   CT HEAD WO CONTRAST Result Date: 05/07/2024 EXAM: CT HEAD WITHOUT CONTRAST 05/07/2024 08:42:03 AM TECHNIQUE: CT of the head was performed without the administration of intravenous contrast. Automated exposure control, iterative reconstruction, and/or weight based adjustment of the mA/kV was utilized to reduce the radiation dose to as low as reasonably achievable. COMPARISON: MRI head dated 10/17/2022. CLINICAL HISTORY: Headache. Patient complains of migraine for a couple of weeks and states she is scared she had a stroke, stroke screen negative per EMS, capillary blood glucose 92, rates headache 10/10 and has a history of migraines. FINDINGS: BRAIN AND VENTRICLES: No acute hemorrhage. Gray-white differentiation is preserved. No hydrocephalus. No extra-axial collection. No mass effect or midline shift. ORBITS: Bilateral lens replacement. SINUSES: Mucosal thickening in the sphenoid sinuses. SOFT TISSUES AND SKULL: Mild atherosclerosis of the skull base involving the carotid siphons. No acute soft tissue abnormality. No skull fracture. IMPRESSION: 1. No acute intracranial abnormality related to the patient's history of migraines. Electronically signed by: Donnice Mania MD 05/07/2024 09:17 AM EDT RP Workstation: [id number]    EKG:   Independently reviewed.  Orders placed or performed during the hospital encounter of 05/07/24   EKG 12-Lead   *Note: Due to a large number of results and/or encounters for the requested time period, some results have not been displayed. A complete  set of results can be found in Results Review.   ---------------------------------------------------------------------------------------------------------------------------------------    Assessment / Plan:   Principal Problem:   Hyponatremia Active Problems:   Chronic gastritis without bleeding   Temporal arteritis syndrome (HCC)   Hypotension   Primary osteoarthritis involving multiple joints   Generalized anxiety disorder   Iron  deficiency anemia   Physical deconditioning   Mixed hyperlipidemia   Rheumatoid arthritis involving multiple sites with positive rheumatoid factor (HCC)   History of CVA (cerebrovascular accident)   Assessment and Plan: * Hyponatremia - Unknown etiology unknown etiology -Recent serum sodium 134, today 125-repeated confirmed 125 -Review home medications as culprit -Holding her diuretics of Lasix  -Pursuing SIADH workup -Continuing IV fluid with normal saline -Currently asymptomatic aside from headaches  Hypotension - Blood pressure running soft, currently 98/42 = Monitoring closely -Continue IV fluid with normal saline -We are holding home medications including diuretics  Temporal arteritis syndrome (HCC) Right-sided temporal pain-headache Ruling out right-sided temporal arthritis (versus atypical migraine headaches-although patient has no history of migraine headaches)   -ESR 65-> 51 (dilated) -CRP normal at 5.2, -WBC 11.1 -CT head-negative -EDP Dr. Jakie discussed the case with vascular surgeon Dr. Serene at [facility] who recommended patient to be admitted for evaluation and possibly biopsy -Patient was given oral prednisone , Benadryl ,  - Allergic to hydrocodone , will utilize NSAIDs, for severe pain Fentanyl  (hypotensive)  Chronic gastritis without bleeding Continue PPI, Carafate   Generalized anxiety disorder - Continue home  medication including as needed Xanax , Atarax , -Continue home medication of Paxil   Primary osteoarthritis  involving multiple joints - Follow-up with rheumatologist as an outpatient   Mixed hyperlipidemia - Will check total CK, will resume statins in a.m.  Physical deconditioning - Starting PT OT for evaluation recommendations  Iron  deficiency anemia Patient is stable, continue to monitor -Continue iron  supplements  Rheumatoid arthritis involving multiple sites with positive rheumatoid factor (HCC) Extensive history of rheumatoid arthritis, -Home analgesics including fentanyl  patch, lidocaine , Voltaren  gel, -on Plaquenil   High cholesterol-resolved as of 05/07/2024 -      Consults called: Vascular surgery  -------------------------------------------------------------------------------------------------------------------------------------------- DVT prophylaxis:  heparin  injection 5,000 Units Start: 05/07/24 2200 SCDs Start: 05/07/24 1231   Code Status:   Code Status: Full Code   Admission status: Patient will be admitted as Inpatient, with a greater than 2 midnight length of stay. Level of care: Telemetry Medical   Family Communication:  none at bedside  (The above findings and plan of care has been discussed with patient in detail, the patient expressed understanding and agreement of above plan)  --------------------------------------------------------------------------------------------------------------------------------------------------  Disposition Plan:  Anticipated 1-2 days Status is: Inpatient Not inpatient appropriate, will call UM team and downgrade to OBS.   ------------------------------------------------------------------------------------------------------------------------------------------  Time spent:  22  Min.  Was spent seeing and evaluating the patient, reviewing all medical records, drawn plan of care.  SIGNED: Adriana DELENA Grams, MD, FHM. FAAFP. [facility], Pager  (Please use [email or web address] to page/ or secure chat through epic) If  7PM-7AM, please contact night-coverage [email or web address],  05/07/2024, 1:07 PM

## 2024-05-07 NOTE — Assessment & Plan Note (Signed)
 Continue PPI, Carafate

## 2024-05-07 NOTE — ED Provider Notes (Signed)
 [facility] AT [facility] Provider Note   CSN: [id number] Arrival date & time: 05/07/24  9281     Patient presents with: Headache   Vanessa Lara is a 68 y.o. female.   68 year old female with a history of rheumatoid arthritis, stroke without residual deficits, hypertension, and hyperlipidemia presents to the emergency department with a headache.  Over the past 2 to 3 weeks has had a gradual onset headache.  Initially was on her left temple and now radiates across her forehead to her right temple.  Says that she may have had some vision changes.  No new weakness.  Says that her toes and fingertips occasionally feel numb.  Has had chills but no objective fevers.  No neck stiffness that is new.  Says that her headache is now 10/10 in severity.  Tried a muscle relaxer for it but did not resolve her symptoms.  No jaw claudication.  Diffuse muscle soreness but does have diffuse pains typically from her RA       Prior to Admission medications   Medication Sig Start Date End Date Taking? Authorizing Provider  acetaminophen  (TYLENOL ) 500 MG tablet Take 1,000 mg by mouth every 6 (six) hours as needed for moderate pain (pain score 4-6) or headache.   Yes [provider]  ALPRAZolam  (XANAX ) 0.25 MG tablet Take 0.25 mg by mouth 2 (two) times daily as needed. 02/22/24  Yes [provider]  Ascorbic Acid (VITAMIN C PO) Take by mouth.   Yes [provider]  ASPIRIN  81 PO Take 1 tablet by mouth daily.   Yes [provider]  atorvastatin  (LIPITOR) 10 MG tablet Take by mouth. 09/17/23  Yes [provider]  Cholecalciferol (VITAMIN D3) 25 MCG (1000 UT) CAPS 1 capsule Orally Once a day for 30 day(s)   Yes [provider]  diclofenac  Sodium (VOLTAREN ) 1 % GEL SMARTSIG:Gram(s) Topical 4 Times Daily PRN 02/28/23  Yes [provider]  docusate sodium  (COLACE) 100 MG capsule TAKE 1 CAPSULE(100 MG) BY MOUTH TWICE DAILY  03/04/24  Yes Magnant, Charles L, PA-C  fentaNYL  (DURAGESIC ) 50 MCG/HR Place 1 patch onto the skin every other day. Patient states she does it every other day.   Yes [provider]  folic acid  (FOLVITE ) 1 MG tablet Take 1 mg by mouth daily.   Yes [provider]  furosemide  (LASIX ) 20 MG tablet Take 20 mg by mouth daily as needed. 10/07/21  Yes [provider]  hydroxychloroquine  (PLAQUENIL ) 200 MG tablet Take daily 5 days out of the week Patient taking differently: Take 200 mg by mouth See admin instructions. Take quarter tablet to 1 tablet Sat and Sunday. Monday-Friday 1 tablet daily 01/07/24  Yes Rice, Lonni ORN, MD  hydrOXYzine  (ATARAX ) 25 MG tablet Take 25 mg by mouth 2 (two) times daily. 01/17/24  Yes [provider]  lidocaine  (LIDODERM ) 5 % one patch daily. 10/06/21  Yes [provider]  MYRBETRIQ 50 MG TB24 tablet Take 50 mg by mouth daily. 01/17/24  Yes [provider]  naloxone TAMMY) nasal spray 4 mg/0.1 mL  09/15/21  Yes [provider]  pantoprazole  (PROTONIX ) 40 MG tablet TAKE 1 TABLET(40 MG) BY MOUTH TWICE DAILY 04/11/23  Yes Danis, Victory LITTIE MOULD, MD  PARoxetine  (PAXIL ) 20 MG tablet Take 20 mg by mouth daily. 12/29/22  Yes [provider]  sucralfate  (CARAFATE ) 1 GM/10ML suspension SHAKE LIQUID AND TAKE 10 ML(1 GRAM) BY MOUTH FOUR TIMES DAILY 08/24/23  Yes [provider]  tiZANidine  (ZANAFLEX ) 4 MG tablet TAKE 1 TABLET(4 MG) BY MOUTH EVERY 8 HOURS AS NEEDED FOR MUSCLE SPASMS 05/01/24  Yes Rice, Lonni ORN, MD  traZODone  (DESYREL ) 100 MG tablet Take 1 tablet (100 mg total) by mouth at bedtime as needed for sleep. 01/25/21  Yes Hilts, Ozell, MD  Zoledronic  Acid (RECLAST  IV) Inject 5 mg into the vein. Once yearly   Yes Rice, Lonni ORN, MD  Heat Wraps [facility] BACK/HIP) MISC 1 application by Does not apply route daily. 10/27/20   Hilts, Ozell, MD    Allergies: Abatacept , Codeine , Golimumab ,  Hydrocodone , Methylprednisolone , Methotrexate derivatives, and Prednisone     Review of Systems  Updated Vital Signs BP (!) 98/42   Pulse 85   Temp 98 F (36.7 C) (Oral)   Resp (!) 21   SpO2 95%   Physical Exam Vitals and nursing note reviewed.  Constitutional:      General: She is not in acute distress.    Appearance: She is well-developed.  HENT:     Head: Normocephalic and atraumatic.     Comments: Temporal artery pulses 2+ bilaterally.  Tenderness palpation of the right temporal artery    Right Ear: External ear normal.     Left Ear: External ear normal.     Nose: Nose normal.  Eyes:     Extraocular Movements: Extraocular movements intact.     Conjunctiva/sclera: Conjunctivae normal.     Pupils: Pupils are equal, round, and reactive to light.     Comments: Visual acuity 20/30 bilaterally  Neck:     Comments: No meningismus Musculoskeletal:     Cervical back: Normal range of motion and neck supple.     Right lower leg: No edema.  Skin:    General: Skin is warm and dry.  Neurological:     Mental Status: She is alert and oriented to person, place, and time. Mental status is at baseline.     Comments: MENTAL STATUS: AAOx3 CRANIAL NERVES: II: Pupils equal and reactive 4 mm BL, no RAPD, no VF deficits III, IV, VI: EOM intact, no gaze preference or deviation, no nystagmus. V: normal sensation to light touch in V1, V2, and V3 segments bilaterally VII: no facial weakness or asymmetry, no nasolabial fold flattening VIII: normal hearing to speech and finger friction IX, X: normal palatal elevation, no uvular deviation XI: 5/5 head turn and 5/5 shoulder shrug bilaterally XII: midline tongue protrusion MOTOR: 5/5 strength in R shoulder flexion, elbow flexion and extension, and grip strength. 5/5 strength in L shoulder flexion, elbow flexion and extension, and grip strength.  5/5 strength in R hip and knee flexion, knee extension, ankle plantar and dorsiflexion. 5/5 strength in  L hip and knee flexion, knee extension, ankle plantar and dorsiflexion. SENSORY: Normal sensation to light touch in all extremities COORD: Normal finger to nose and heel to shin, no tremor, no dysmetria   Psychiatric:        Mood and Affect: Mood normal.     (all labs ordered are listed, but only abnormal results are displayed) Labs Reviewed  COMPREHENSIVE METABOLIC PANEL WITH GFR - Abnormal; Notable for the following components:      Result Value   Sodium 125 (*)    Chloride 93 (*)    CO2 21 (*)    Calcium  8.5 (*)    All other components within normal limits  CBC WITH DIFFERENTIAL/PLATELET - Abnormal; Notable for the following components:   WBC 11.1 (*)  RBC 3.42 (*)    Hemoglobin 10.0 (*)    HCT 30.3 (*)    RDW 17.8 (*)    Neutro Abs 8.6 (*)    All other components within normal limits  SEDIMENTATION RATE - Abnormal; Notable for the following components:   Sed Rate 51 (*)    All other components within normal limits  SODIUM - Abnormal; Notable for the following components:   Sodium 125 (*)    All other components within normal limits  PHOSPHORUS - Abnormal; Notable for the following components:   Phosphorus 2.3 (*)    All other components within normal limits  EXPECTORATED SPUTUM ASSESSMENT W GRAM STAIN, RFLX TO RESP C  MAGNESIUM  OSMOLALITY  URINALYSIS, ROUTINE W REFLEX MICROSCOPIC  SODIUM, URINE, RANDOM  C-REACTIVE PROTEIN  HIV ANTIBODY (ROUTINE TESTING W REFLEX)  OSMOLALITY, URINE  NA AND K (SODIUM & POTASSIUM), RAND UR  C-REACTIVE PROTEIN  CK TOTAL AND CKMB (NOT AT [facility])    EKG: None  Radiology: CT HEAD WO CONTRAST Result Date: 05/07/2024 EXAM: CT HEAD WITHOUT CONTRAST 05/07/2024 08:42:03 AM TECHNIQUE: CT of the head was performed without the administration of intravenous contrast. Automated exposure control, iterative reconstruction, and/or weight based adjustment of the mA/kV was utilized to reduce the radiation dose to as low as reasonably achievable.  COMPARISON: MRI head dated 10/17/2022. CLINICAL HISTORY: Headache. Patient complains of migraine for a couple of weeks and states she is scared she had a stroke, stroke screen negative per EMS, capillary blood glucose 92, rates headache 10/10 and has a history of migraines. FINDINGS: BRAIN AND VENTRICLES: No acute hemorrhage. Gray-white differentiation is preserved. No hydrocephalus. No extra-axial collection. No mass effect or midline shift. ORBITS: Bilateral lens replacement. SINUSES: Mucosal thickening in the sphenoid sinuses. SOFT TISSUES AND SKULL: Mild atherosclerosis of the skull base involving the carotid siphons. No acute soft tissue abnormality. No skull fracture. IMPRESSION: 1. No acute intracranial abnormality related to the patient's history of migraines. Electronically signed by: Donnice Mania MD 05/07/2024 09:17 AM EDT RP Workstation: [id number]     Procedures   Medications Ordered in the ED  heparin  injection 5,000 Units (has no administration in time range)  sodium chloride  flush (NS) 0.9 % injection 3 mL (3 mLs Intravenous Not Given 05/07/24 1354)  0.9 %  sodium chloride  infusion ( Intravenous New Bag/Given 05/07/24 1354)  sodium chloride  flush (NS) 0.9 % injection 3 mL (3 mLs Intravenous Not Given 05/07/24 1356)  acetaminophen  (TYLENOL ) tablet 650 mg (has no administration in time range)    Or  acetaminophen  (TYLENOL ) suppository 650 mg (has no administration in time range)  oxyCODONE  (Oxy IR/ROXICODONE ) immediate release tablet 5 mg (has no administration in time range)  senna-docusate (Senokot-S) tablet 1 tablet (has no administration in time range)  bisacodyl  (DULCOLAX) EC tablet 5 mg (has no administration in time range)  sodium phosphate  (FLEET) enema 1 enema (has no administration in time range)  ondansetron  (ZOFRAN ) tablet 4 mg ( Oral See Alternative 05/07/24 1356)    Or  ondansetron  (ZOFRAN ) injection 4 mg (4 mg Intravenous Given 05/07/24 1356)  ipratropium (ATROVENT ) nebulizer  solution 0.5 mg (has no administration in time range)  levalbuterol  (XOPENEX ) nebulizer solution 0.63 mg (has no administration in time range)  hydrALAZINE  (APRESOLINE ) injection 10 mg (has no administration in time range)  ibuprofen  (ADVIL ) tablet 600 mg (has no administration in time range)  predniSONE  (DELTASONE ) tablet 60 mg (has no administration in time range)  fentaNYL  (SUBLIMAZE ) injection 25 mcg (  has no administration in time range)  ALPRAZolam  (XANAX ) tablet 0.25 mg (has no administration in time range)  atorvastatin  (LIPITOR) tablet 10 mg (has no administration in time range)  aspirin  EC tablet 81 mg (has no administration in time range)  docusate sodium  (COLACE) capsule 200 mg (has no administration in time range)  folic acid  (FOLVITE ) tablet 1 mg (has no administration in time range)  lidocaine  (LIDODERM ) 5 % 1 patch (has no administration in time range)  pantoprazole  (PROTONIX ) EC tablet 40 mg (has no administration in time range)  PARoxetine  (PAXIL ) tablet 20 mg (has no administration in time range)  traZODone  (DESYREL ) tablet 100 mg (has no administration in time range)  sucralfate  (CARAFATE ) 1 GM/10ML suspension 1 g (has no administration in time range)  hydrOXYzine  (ATARAX ) tablet 25 mg (has no administration in time range)  metoCLOPramide  (REGLAN ) injection 10 mg (10 mg Intravenous Given 05/07/24 0851)  diphenhydrAMINE  (BENADRYL ) injection 12.5 mg (12.5 mg Intravenous Given 05/07/24 0850)  acetaminophen  (TYLENOL ) tablet 1,000 mg (1,000 mg Oral Given 05/07/24 0850)  sodium chloride  0.9 % bolus 1,000 mL (1,000 mLs Intravenous Bolus 05/07/24 1130)  predniSONE  (DELTASONE ) tablet 40 mg (10 mg Oral Given 05/07/24 1124)    Clinical Course as of 05/07/24 1422  Wed May 07, 2024  0928 Sodium(!): 125 134 yesterday [RP]  1152 Dr Serene from vascular surgery consulted will see when tranferred to [facility].  [RP]  1229 Dr Willette from hospitalist consulted for admission [RP]    Clinical  Course User Index [RP] Yolande Lamar BROCKS, MD                                 Medical Decision Making Amount and/or Complexity of Data Reviewed Labs: ordered. Decision-making details documented in ED Course. Radiology: ordered.  Risk OTC drugs. Prescription drug management. Decision regarding hospitalization.   68 year old female with a history of rheumatoid arthritis, stroke without residual deficits, hypertension, and hyperlipidemia presents to the emergency department with a headache.  Initial Ddx:  Migraine, ICH, mass, temporal arteritis, stroke, electrolyte abnormality  MDM/Course:  Presents emergency department for headache.  Is complaining of headache across her forehead and his right temple.  Has had some subjective vision changes but no significant decrease in her vision on visual acuity.  Does not have any focal neurologic deficits.  Does have tenderness to palpation of her right temporal artery.  CT head unremarkable.  She had blood work that was sent that showed that she has a sodium of 125 which is down from 134 yesterday.  Did send a repeat sodium to confirm that this was not a spurious value.  She appears volume down and was given normal saline bolus.  Do not feel that there is an indication for hypertonic at this time.  With her headache starting prior to her sodium dropping feel that while it could be worsening her symptoms may not be the entire cause of it.  Her ESR is marginally elevated at 51 which is down from priors.  Given her constitutional symptoms elevated sed rate and age with the temporal artery tenderness to palpation to talk to vascular surgery about getting a biopsy.  Did order her for prednisone  in the meantime as well.  Admitted to hospitalist for further management of her hyponatremia and for her biopsy  This patient presents to the ED for concern of complaints listed in HPI, this involves an extensive number of treatment options,  and is a complaint that  carries with it a high risk of complications and morbidity. Disposition including potential need for admission considered.   Dispo: Admit to Floor  Records reviewed ED Visit Notes The following labs were independently interpreted: Chemistry and show hyponatremia I independently reviewed the following imaging with scope of interpretation limited to determining acute life threatening conditions related to emergency care: CT Head and agree with the radiologist interpretation with the following exceptions: none I personally reviewed and interpreted cardiac monitoring: normal sinus rhythm  I personally reviewed and interpreted the pt's EKG: see above for interpretation  I have reviewed the patients home medications and made adjustments as needed Consults: Hospitalist and Vascular Surgery Social Determinants of health:  Geriatric  Portions of this note were generated with Scientist, clinical (histocompatibility and immunogenetics). Dictation errors may occur despite best attempts at proofreading.    CRITICAL CARE Performed by: Lamar JAYSON Shan   Total critical care time: 30 minutes  Critical care time was exclusive of separately billable procedures and treating other patients.  Critical care was necessary to treat or prevent imminent or life-threatening deterioration.  Critical care was time spent personally by me on the following activities: development of treatment plan with patient and/or surrogate as well as nursing, discussions with consultants, evaluation of patient's response to treatment, examination of patient, obtaining history from patient or surrogate, ordering and performing treatments and interventions, ordering and review of laboratory studies, ordering and review of radiographic studies, pulse oximetry and re-evaluation of patient's condition.  Final diagnoses:  Nonintractable headache, unspecified chronicity pattern, unspecified headache type  Hyponatremia    ED Discharge Orders     None           Shan Lamar JAYSON, MD 05/07/24 1423

## 2024-05-07 NOTE — Assessment & Plan Note (Signed)
-   Follow-up with rheumatologist as an outpatient

## 2024-05-07 NOTE — Assessment & Plan Note (Signed)
-   Continue home medication including as needed Xanax , Atarax , -Continue home medication of Paxil

## 2024-05-07 NOTE — Assessment & Plan Note (Signed)
-   Will check total CK, will resume statins in a.m.

## 2024-05-07 NOTE — Telephone Encounter (Addendum)
 SAVED a Prior Authorization request to [employer] for ENBREL via CoverMyMeds. Submission pending OV note from 05/06/2024 to be signed  Key: [id number]   ----- Message from Sherry GORMAN Pennant sent at 05/06/2024 12:03 PM EDT ----- Please start Enbrel Sureclick BIV for RA Dose: 50mg  subcut every 7 days Will complete first dose in clinic

## 2024-05-07 NOTE — Assessment & Plan Note (Signed)
 Patient is stable, continue to monitor -Continue iron  supplements

## 2024-05-07 NOTE — Assessment & Plan Note (Addendum)
 Right-sided temporal pain-headache Ruling out right-sided temporal arthritis (versus atypical migraine headaches-although patient has no history of migraine headaches)   -ESR 65-> 51 (dilated) -CRP normal at 5.2, -WBC 11.1 -CT head-negative -EDP Dr. Jakie discussed the case with vascular surgeon Dr. Serene at [facility] who recommended patient to be admitted for evaluation and possibly biopsy -Patient was given oral prednisone , Benadryl ,  - Allergic to hydrocodone , will utilize NSAIDs, for severe pain Fentanyl  (hypotensive)

## 2024-05-07 NOTE — Assessment & Plan Note (Signed)
 SABRA

## 2024-05-07 NOTE — Assessment & Plan Note (Addendum)
-   Unknown etiology unknown etiology -Recent serum sodium 134, today 125-repeated confirmed 125 -Review home medications as culprit -Holding her diuretics of Lasix  -Pursuing SIADH workup -Continuing IV fluid with normal saline -Currently asymptomatic aside from headaches

## 2024-05-08 ENCOUNTER — Inpatient Hospital Stay (HOSPITAL_BASED_OUTPATIENT_CLINIC_OR_DEPARTMENT_OTHER): Admitting: Anesthesiology

## 2024-05-08 ENCOUNTER — Encounter (HOSPITAL_COMMUNITY)
Admission: EM | Disposition: A | Discharge status: Home / Self Care | Payer: Self-pay | Source: Home / Self Care | Attending: Emergency Medicine

## 2024-05-08 ENCOUNTER — Ambulatory Visit

## 2024-05-08 ENCOUNTER — Inpatient Hospital Stay (HOSPITAL_COMMUNITY): Admitting: Anesthesiology

## 2024-05-08 ENCOUNTER — Encounter (HOSPITAL_COMMUNITY): Payer: Self-pay | Admitting: Family Medicine

## 2024-05-08 ENCOUNTER — Other Ambulatory Visit: Payer: Self-pay

## 2024-05-08 DIAGNOSIS — M316 Other giant cell arteritis: Secondary | ICD-10-CM

## 2024-05-08 DIAGNOSIS — F17210 Nicotine dependence, cigarettes, uncomplicated: Secondary | ICD-10-CM | POA: Diagnosis not present

## 2024-05-08 DIAGNOSIS — K2930 Chronic superficial gastritis without bleeding: Secondary | ICD-10-CM | POA: Diagnosis not present

## 2024-05-08 DIAGNOSIS — I110 Hypertensive heart disease with heart failure: Secondary | ICD-10-CM

## 2024-05-08 DIAGNOSIS — I509 Heart failure, unspecified: Secondary | ICD-10-CM | POA: Diagnosis not present

## 2024-05-08 DIAGNOSIS — R519 Headache, unspecified: Secondary | ICD-10-CM | POA: Diagnosis not present

## 2024-05-08 DIAGNOSIS — M069 Rheumatoid arthritis, unspecified: Secondary | ICD-10-CM | POA: Diagnosis not present

## 2024-05-08 DIAGNOSIS — E871 Hypo-osmolality and hyponatremia: Secondary | ICD-10-CM | POA: Diagnosis not present

## 2024-05-08 HISTORY — PX: ARTERY BIOPSY: SHX891

## 2024-05-08 LAB — SEDIMENTATION RATE: Sed Rate: 40 mm/h — ABNORMAL HIGH (ref 0–22)

## 2024-05-08 LAB — BASIC METABOLIC PANEL WITH GFR
Anion gap: 6 (ref 5–15)
BUN: <5 mg/dL — ABNORMAL LOW (ref 8–23)
CO2: 24 mmol/L (ref 22–32)
Calcium: 8.1 mg/dL — ABNORMAL LOW (ref 8.9–10.3)
Chloride: 109 mmol/L (ref 98–111)
Creatinine, Ser: 0.54 mg/dL (ref 0.44–1.00)
GFR, Estimated: >60 mL/min (ref >60)
Glucose, Bld: 93 mg/dL (ref 70–99)
Potassium: 3.5 mmol/L (ref 3.5–5.1)
Sodium: 139 mmol/L (ref 135–145)

## 2024-05-08 LAB — CBC
HCT: 26.3 % — ABNORMAL LOW (ref 36.0–46.0)
Hemoglobin: 8.4 g/dL — ABNORMAL LOW (ref 12.0–15.0)
MCH: 28.3 pg (ref 26.0–34.0)
MCHC: 31.9 g/dL (ref 30.0–36.0)
MCV: 88.6 fL (ref 80.0–100.0)
Platelets: 245 K/uL (ref 150–400)
RBC: 2.97 MIL/uL — ABNORMAL LOW (ref 3.87–5.11)
RDW: 18 % — ABNORMAL HIGH (ref 11.5–15.5)
WBC: 6.3 K/uL (ref 4.0–10.5)
nRBC: 0 % (ref 0.0–0.2)

## 2024-05-08 LAB — C-REACTIVE PROTEIN: CRP: 1.1 mg/dL — ABNORMAL HIGH (ref <1.0)

## 2024-05-08 LAB — APTT: aPTT: 29 s (ref 24–36)

## 2024-05-08 SURGERY — BIOPSY TEMPORAL ARTERY
Anesthesia: Choice | Laterality: Left

## 2024-05-08 SURGERY — BIOPSY TEMPORAL ARTERY
Anesthesia: Monitor Anesthesia Care | Site: Face | Laterality: Left

## 2024-05-08 MED ORDER — DEXAMETHASONE SODIUM PHOSPHATE 10 MG/ML IJ SOLN
INTRAMUSCULAR | Status: AC
Start: 2024-05-08 — End: 2024-05-08
  Filled 2024-05-08: qty 1

## 2024-05-08 MED ORDER — ORAL CARE MOUTH RINSE: 15.0000 mL | Freq: Once | OROMUCOSAL | Status: AC

## 2024-05-08 MED ORDER — PHENYLEPHRINE 80 MCG/ML (10ML) SYRINGE FOR IV PUSH (FOR BLOOD PRESSURE SUPPORT)
PREFILLED_SYRINGE | INTRAVENOUS | Status: DC | PRN
  Administered 2024-05-08: 80 ug via INTRAVENOUS

## 2024-05-08 MED ORDER — ONDANSETRON HCL 4 MG/2ML IJ SOLN
INTRAMUSCULAR | Status: AC
  Filled 2024-05-08: qty 2

## 2024-05-08 MED ORDER — FENTANYL CITRATE (PF) 250 MCG/5ML IJ SOLN
INTRAMUSCULAR | Status: AC
  Filled 2024-05-08: qty 5

## 2024-05-08 MED ORDER — ONDANSETRON HCL 4 MG/2ML IJ SOLN
INTRAMUSCULAR | Status: DC | PRN
  Administered 2024-05-08: 4 mg via INTRAVENOUS

## 2024-05-08 MED ORDER — CHLORHEXIDINE GLUCONATE 0.12 % MT SOLN
OROMUCOSAL | Status: AC
  Administered 2024-05-08: 15 mL via OROMUCOSAL
  Filled 2024-05-08: qty 15

## 2024-05-08 MED ORDER — MIDAZOLAM HCL 2 MG/2ML IJ SOLN
INTRAMUSCULAR | Status: DC | PRN
  Administered 2024-05-08: 1 mg via INTRAVENOUS

## 2024-05-08 MED ORDER — LIDOCAINE HCL (PF) 1 % IJ SOLN
INTRAMUSCULAR | Status: AC
  Filled 2024-05-08: qty 30

## 2024-05-08 MED ORDER — PROPOFOL 10 MG/ML IV BOLUS
INTRAVENOUS | Status: AC
  Filled 2024-05-08: qty 20

## 2024-05-08 MED ORDER — MIDAZOLAM HCL 2 MG/2ML IJ SOLN
INTRAMUSCULAR | Status: AC
Start: 2024-05-08 — End: 2024-05-08
  Filled 2024-05-08: qty 2

## 2024-05-08 MED ORDER — CHLORHEXIDINE GLUCONATE 0.12 % MT SOLN: 15.0000 mL | Freq: Once | OROMUCOSAL | Status: AC

## 2024-05-08 MED ORDER — CEFAZOLIN SODIUM-DEXTROSE 2-4 GM/100ML-% IV SOLN
INTRAVENOUS | Status: AC
  Filled 2024-05-08: qty 100

## 2024-05-08 MED ORDER — LIDOCAINE HCL (PF) 1 % IJ SOLN
INTRAMUSCULAR | Status: DC | PRN
Start: 2024-05-08 — End: 2024-05-08
  Administered 2024-05-08: 1 mL

## 2024-05-08 MED ORDER — PHENYLEPHRINE 80 MCG/ML (10ML) SYRINGE FOR IV PUSH (FOR BLOOD PRESSURE SUPPORT)
PREFILLED_SYRINGE | INTRAVENOUS | Status: AC
  Filled 2024-05-08: qty 10

## 2024-05-08 MED ORDER — LIDOCAINE 2% (20 MG/ML) 5 ML SYRINGE
INTRAMUSCULAR | Status: DC | PRN
  Administered 2024-05-08: 50 mg via INTRAVENOUS

## 2024-05-08 MED ORDER — PREDNISONE 20 MG PO TABS: ORAL_TABLET | ORAL | 0 refills | Status: AC

## 2024-05-08 MED ORDER — FENTANYL CITRATE (PF) 250 MCG/5ML IJ SOLN
INTRAMUSCULAR | Status: DC | PRN
  Administered 2024-05-08 (×4): 25 ug via INTRAVENOUS

## 2024-05-08 MED ORDER — LACTATED RINGERS IV SOLN: INTRAVENOUS | Status: DC

## 2024-05-08 MED ORDER — PROPOFOL 500 MG/50ML IV EMUL
INTRAVENOUS | Status: DC | PRN
Start: 2024-05-08 — End: 2024-05-08
  Administered 2024-05-08: 60 ug/kg/min via INTRAVENOUS

## 2024-05-08 MED ORDER — LIDOCAINE 2% (20 MG/ML) 5 ML SYRINGE
INTRAMUSCULAR | Status: AC
Start: 2024-05-08 — End: 2024-05-08
  Filled 2024-05-08: qty 5

## 2024-05-08 MED ORDER — CEFAZOLIN SODIUM-DEXTROSE 2-4 GM/100ML-% IV SOLN
2.0000 g | Freq: Once | INTRAVENOUS | Status: AC
  Administered 2024-05-08: 2 g via INTRAVENOUS

## 2024-05-08 SURGICAL SUPPLY — 36 items
BAG COUNTER SPONGE SURGICOUNT (BAG) ×2 IMPLANT
CANISTER SUCTION 3000ML PPV (SUCTIONS) ×2 IMPLANT
CLIP TI MEDIUM 6 (CLIP) ×2 IMPLANT
CLIP TI WIDE RED SMALL 6 (CLIP) ×2 IMPLANT
CNTNR URN SCR LID CUP LEK RST (MISCELLANEOUS) ×2 IMPLANT
COTTONBALL LRG STERILE PKG (GAUZE/BANDAGES/DRESSINGS) ×2 IMPLANT
COVER PROBE W GEL 5X96 (DRAPES) IMPLANT
COVER SURGICAL LIGHT HANDLE (MISCELLANEOUS) ×2 IMPLANT
DERMABOND ADVANCED .7 DNX12 (GAUZE/BANDAGES/DRESSINGS) ×2 IMPLANT
DRAPE OPHTHALMIC 77X100 STRL (CUSTOM PROCEDURE TRAY) ×2 IMPLANT
ELECT NDL BLADE 2-5/6 (NEEDLE) ×2 IMPLANT
ELECT NEEDLE BLADE 2-5/6 (NEEDLE) ×1 IMPLANT
ELECTRODE REM PT RTRN 9FT ADLT (ELECTROSURGICAL) ×2 IMPLANT
GAUZE 4X4 16PLY ~~LOC~~+RFID DBL (SPONGE) ×2 IMPLANT
GEL ULTRASOUND 8.5O AQUASONIC (MISCELLANEOUS) ×2 IMPLANT
GLOVE BIOGEL PI IND STRL 7.0 (GLOVE) ×2 IMPLANT
GOWN STRL REUS W/ TWL LRG LVL3 (GOWN DISPOSABLE) ×2 IMPLANT
GOWN STRL REUS W/ TWL XL LVL3 (GOWN DISPOSABLE) ×2 IMPLANT
KIT BASIN OR (CUSTOM PROCEDURE TRAY) ×2 IMPLANT
KIT TURNOVER KIT B (KITS) ×2 IMPLANT
LOOP VESSEL MINI RED (MISCELLANEOUS) IMPLANT
NDL HYPO 25GX1X1/2 BEV (NEEDLE) ×2 IMPLANT
NEEDLE HYPO 25GX1X1/2 BEV (NEEDLE) ×1 IMPLANT
NS IRRIG 1000ML POUR BTL (IV SOLUTION) ×2 IMPLANT
PACK GENERAL/GYN (CUSTOM PROCEDURE TRAY) ×2 IMPLANT
PAD ARMBOARD POSITIONER FOAM (MISCELLANEOUS) ×4 IMPLANT
POWDER SURGICEL 3.0 GRAM (HEMOSTASIS) IMPLANT
SPIKE FLUID TRANSFER (MISCELLANEOUS) ×2 IMPLANT
SUCTION TUBE FRAZIER 10FR DISP (SUCTIONS) ×2 IMPLANT
SUT MNCRL AB 4-0 PS2 18 (SUTURE) ×2 IMPLANT
SUT PROLENE 6 0 BV (SUTURE) IMPLANT
SUT SILK 3-0 18XBRD TIE 12 (SUTURE) IMPLANT
SUT VIC AB 3-0 SH 27X BRD (SUTURE) ×2 IMPLANT
SYR CONTROL 10ML LL (SYRINGE) ×2 IMPLANT
TOWEL GREEN STERILE (TOWEL DISPOSABLE) ×2 IMPLANT
WATER STERILE IRR 1000ML POUR (IV SOLUTION) ×2 IMPLANT

## 2024-05-08 NOTE — Op Note (Signed)
    OPERATIVE NOTE  PROCEDURE:   Left temporal artery biopsy  PRE-OPERATIVE DIAGNOSIS: Headaches, possible GCA  POST-OPERATIVE DIAGNOSIS: same as above   SURGEON: Norman GORMAN Serve MD  ASSISTANT(S): None  ANESTHESIA: Sedation and local  ESTIMATED BLOOD LOSS: Minimal  FINDING(S): Small temporal artery with vein adherent and corkscrew around it  SPECIMEN(S): Temporal artery and vein  INDICATIONS:   Vanessa Lara is a 68 y.o. female with headaches located around the left temporal region but has been radiating across the forehead to the right side.  She does report some possible visual changes.  She has a history of rheumatoid arthritis and we were asked to perform a temporal artery biopsy to rule out temporal arteritis.  Risks and benefits of temporal artery biopsy reviewed, she expressed understanding and elected to proceed.  DESCRIPTION: The patient was brought to the operating room and positioned supine on the operating table.  IV sedation was administered and the left side of the head was prepped and draped in the usual sterile fashion.  A timeout was performed and preoperative antibiotics were administered. I started by mapping course of the temporal artery with a Doppler.  This was just anterior to the hairline.  This area was marked and then instilled with 1% plain lidocaine .  A vertical incision was made along the anterior border of the line.  This was carried deeper electrocautery.  With sharp and blunt dissection I was able to visualize what appeared to be the temporal artery and vein.  The vein appeared adherent and somewhat corkscrew around the artery.  This was isolated for approximately 2 cm segment.  This was ligated distally with a silk tie and transected.  There was pulsatile flow which confirmed that the specimen was arterial.  This was then ligated proximally with a silk tie, completely transected and the specimen was passed off the field.  The incision was copiously  irrigated and inspected for hemostasis which was achieved with electrocautery.  The incision was then closed with 3-0 Vicryl and 4-0 Monocryl for the skin and Dermabond was applied. All counts were correct at the end of the procedure, she tolerated the procedure well and was brought to recovery in stable condition.    COMPLICATIONS: None apparent  CONDITION: Stable  Norman GORMAN Serve MD [facility] Phone Number: [phone] 05/08/2024 10:08 AM

## 2024-05-08 NOTE — Transfer of Care (Signed)
 Immediate Anesthesia Transfer of Care Note  Patient: Vanessa Lara  Procedure(s) Performed: LEFT TEMPORAL ARTERY BIOPSY (Left: Face)  Patient Location: PACU  Anesthesia Type:MAC  Level of Consciousness: awake, alert , and oriented  Airway & Oxygen Therapy: Patient Spontanous Breathing and Patient connected to nasal cannula oxygen  Post-op Assessment: Report given to RN and Post -op Vital signs reviewed and stable  Post vital signs: Reviewed and stable  Last Vitals:  Vitals Value Taken Time  BP 103/61 05/08/24 10:00  Temp    Pulse 85 05/08/24 10:03  Resp 15 05/08/24 10:03  SpO2 96 % 05/08/24 10:03  Vitals shown include unfiled device data.  Last Pain:  Vitals:   05/08/24 0824  TempSrc: Oral  PainSc: 0-No pain      Patients Stated Pain Goal: 2 (05/07/24 0727)  Complications: No notable events documented.

## 2024-05-08 NOTE — TOC Transition Note (Signed)
 Transition of Care [facility]) - Discharge Note   Patient Details  Name: Vanessa Lara MRN: [id number] Date of Birth: [birth year]  Transition of Care [facility]) CM/SW Contact:  Rosaline JONELLE Joe, RN Phone Number: 05/08/2024, 2:46 PM   Clinical Narrative:    CM spoke with UR and patient was provided with [facility] letter and Code 44 was completed.  The patient is discharging home today and requests transportation home by [employer] transportation.  The patient lives alone and has RW and Cane at her home.  Patient is active with [facility] services and they are aware that patient is discharging home today.  Bedside nursing will transfer patient to the discharge lounge and provide discharge instructions.     Barriers to Discharge: Continued Medical Work up   Patient Goals and CMS Choice            Discharge Placement                       Discharge Plan and Services Additional resources added to the After Visit Summary for                                       Social Drivers of Health (SDOH) Interventions SDOH Screenings   Food Insecurity: No Food Insecurity (05/07/2024)  Housing: Low Risk  (05/07/2024)  Transportation Needs: No Transportation Needs (05/07/2024)  Utilities: Not At Risk (05/07/2024)  Depression (PHQ2-9): Low Risk  (01/14/2024)  Financial Resource Strain: Low Risk  (11/14/2022)   Received from [facility]  Physical Activity: Unknown (11/14/2022)   Received from [facility]  Social Connections: Patient Declined (05/07/2024)  Stress: Stress Concern Present (11/14/2022)   Received from [facility]  Tobacco Use: High Risk (05/08/2024)     Readmission Risk Interventions    05/08/2024    2:46 PM  Readmission Risk Prevention Plan  Post Dischage Appt Complete  Medication Screening Complete  Transportation Screening Complete

## 2024-05-08 NOTE — Progress Notes (Signed)
 PT Cancellation Note  Patient Details Name: Vanessa Lara MRN: [id number] DOB: Nov 05, 1955   Cancelled Treatment:    Reason Eval/Treat Not Completed: Medical issues which prohibited therapy (Pt in surgery. Will return as able.)   [name] [name] [name] 05/08/2024, 8:40 AM Siegfried Vieth M,PT Acute Rehab Services [phone]

## 2024-05-08 NOTE — Progress Notes (Signed)
 OT Cancellation Note  Patient Details Name: Vanessa Lara MRN: [id number] DOB: 25-May-1956   Cancelled Treatment:    Reason Eval/Treat Not Completed: Patient at procedure or test/ unavailable (Pt off unit at biopsy, OT to follow-up with pt as able)  05/08/2024  AB, OTR/L  Acute Rehabilitation Services  Office: [phone]   Curtistine JONETTA Das 05/08/2024, 8:53 AM

## 2024-05-08 NOTE — Evaluation (Signed)
 Occupational Therapy Evaluation and DC Summary  Patient Details Name: Vanessa Lara MRN: [id number] DOB: 01-15-1956 Today's Date: 05/08/2024   History of Present Illness   Pt is a 68 yo female who presents to [facility] on 05/07/24 with c/o headaches across the L temporal region and some possible vision changes. CT head without any acute abnormalities, further workup for hyponatremia. PMH of anxiety, RA, CHF, CVA, HTN, IBS, Osteoporosis.     Clinical Impressions Pt admitted for above, PTA Pt lived alone and reports being mod I for mobility and ADLs using SPC vs RW in home. Pt currently presenting close to functional baseline, ambulating ind short distance in room no AD and completing ADLs at ind/mod I leve. Pt BP stable throughout session, Discussed with pt AE such as angled utensils, universal cuffs, and door knob extenders if arthritis progresses and limits her function.     If plan is discharge home, recommend the following:         Functional Status Assessment   Patient has not had a recent decline in their functional status     Equipment Recommendations   None recommended by OT     Recommendations for Other Services         Precautions/Restrictions   Precautions Recall of Precautions/Restrictions: Intact Restrictions Weight Bearing Restrictions Per Provider Order: No     Mobility Bed Mobility Overal bed mobility: Modified Independent                  Transfers Overall transfer level: Modified independent Equipment used: None                      Balance Overall balance assessment: Mild deficits observed, not formally tested                                         ADL either performed or assessed with clinical judgement   ADL Overall ADL's : Independent;Modified independent                                       General ADL Comments: Pt able to complete seated/standing ADLs at mod i/ind level;      Vision   Vision Assessment?: No apparent visual deficits     Perception         Praxis         Pertinent Vitals/Pain Pain Assessment Pain Assessment: No/denies pain     Extremity/Trunk Assessment Upper Extremity Assessment Upper Extremity Assessment: LUE deficits/detail;RUE deficits/detail;Generalized weakness RUE Deficits / Details: arthritis, MCPs ulnar deviated with PIP flexion contractures. LUE Deficits / Details: arthritis, MCPs ulnar deviated with PIP flexion contractures.   Lower Extremity Assessment Lower Extremity Assessment: Defer to PT evaluation   Cervical / Trunk Assessment Cervical / Trunk Assessment: Other exceptions Cervical / Trunk Exceptions: Pt very thin build   Communication Communication Communication: No apparent difficulties   Cognition Arousal: Alert Behavior During Therapy: WFL for tasks assessed/performed Cognition: No apparent impairments                               Following commands: Intact       Cueing  General Comments   Cueing Techniques: Verbal cues  Pt with bruising of skin  around RUE elbow, she reports her IV site got infiltrated last night   Exercises     Shoulder Instructions      Home Living Family/patient expects to be discharged to:: Private residence Living Arrangements: Alone Available Help at Discharge: Neighbor;Available PRN/intermittently Type of Home: House Home Access: Stairs to enter [employer] of Steps: 4 Entrance Stairs-Rails: Can reach both Home Layout: One level     Bathroom Shower/Tub: Tub only   Firefighter: Handicapped height     Home Equipment: Cane - single point;Rollator (4 wheels)   Additional Comments: will take bird bath if needed      Prior Functioning/Environment Prior Level of Function : Independent/Modified Independent             Mobility Comments: SPC vs RW ADLs Comments: mod i ADLs    OT Problem List: Other (comment)  (resolved)   OT Treatment/Interventions:        OT Goals(Current goals can be found in the care plan section)   Acute Rehab OT Goals Patient Stated Goal: go home OT Goal Formulation: With patient Time For Goal Achievement: 05/22/24 Potential to Achieve Goals: Good   OT Frequency:       Co-evaluation              AM-PAC OT 6 Clicks Daily Activity     Outcome Measure Help from another person eating meals?: None Help from another person taking care of personal grooming?: None Help from another person toileting, which includes using toliet, bedpan, or urinal?: None Help from another person bathing (including washing, rinsing, drying)?: None Help from another person to put on and taking off regular upper body clothing?: None Help from another person to put on and taking off regular lower body clothing?: None 6 Click Score: 24   End of Session Nurse Communication: Mobility status  Activity Tolerance: Patient tolerated treatment well Patient left: in bed;with call bell/phone within reach  OT Visit Diagnosis: Low vision, both eyes (H54.2)                Time: 1244-1300 OT Time Calculation (min): 16 min Charges:  OT General Charges $OT Visit: 1 Visit OT Evaluation $OT Eval Low Complexity: 1 Low  05/08/2024  AB, OTR/L  Acute Rehabilitation Services  Office: [phone]   Curtistine JONETTA Das 05/08/2024, 1:14 PM

## 2024-05-08 NOTE — Care Management CC44 (Cosign Needed)
 Condition Code 44 Documentation Completed  Patient Details  Name: ANGALENA COUSINEAU MRN: [id number] Date of Birth: 07-09-1956   Condition Code 44 given:  Yes Patient signature on Condition Code 44 notice:  Yes Documentation of 2 MD's agreement:  Yes Code 44 added to claim:  Yes    Rosaline JONELLE Joe, RN 05/08/2024, 2:37 PM

## 2024-05-08 NOTE — Progress Notes (Addendum)
  Progress Note    05/08/2024 8:07 AM * Day of Surgery *  Subjective:  no complaints   Vitals:   05/08/24 0421 05/08/24 0750  BP: 115/69 111/61  Pulse: 87 73  Resp: 20 16  Temp: 97.8 F (36.6 C) 98.1 F (36.7 C)  SpO2: 97% 94%   Physical Exam: Lungs:  non labored Extremities:  moving ext well Neurologic: A&O  CBC    Component Value Date/Time   WBC 6.3 05/08/2024 0246   RBC 2.97 (L) 05/08/2024 0246   HGB 8.4 (L) 05/08/2024 0246   HGB 11.5 10/07/2020 1321   HCT 26.3 (L) 05/08/2024 0246   HCT 34.0 10/07/2020 1321   PLT 245 05/08/2024 0246   PLT 462 (H) 10/07/2020 1321   MCV 88.6 05/08/2024 0246   MCV 91 10/07/2020 1321   MCH 28.3 05/08/2024 0246   MCHC 31.9 05/08/2024 0246   RDW 18.0 (H) 05/08/2024 0246   RDW 13.7 10/07/2020 1321   LYMPHSABS 1.8 05/07/2024 0750   LYMPHSABS 2.9 10/07/2020 1321   MONOABS 0.5 05/07/2024 0750   EOSABS 0.1 05/07/2024 0750   EOSABS 0.1 10/07/2020 1321   BASOSABS 0.1 05/07/2024 0750   BASOSABS 0.1 10/07/2020 1321    BMET    Component Value Date/Time   NA 139 05/08/2024 0246   NA 141 11/06/2019 1417   K 3.5 05/08/2024 0246   CL 109 05/08/2024 0246   CO2 24 05/08/2024 0246   GLUCOSE 93 05/08/2024 0246   BUN <5 (L) 05/08/2024 0246   BUN 10 11/06/2019 1417   CREATININE 0.54 05/08/2024 0246   CREATININE 0.56 05/06/2024 1148   CALCIUM  8.1 (L) 05/08/2024 0246   GFRNONAA >60 05/08/2024 0246   GFRNONAA >60 08/23/2020 1609   GFRAA 92 11/06/2019 1417    INR No results found for: INR   Intake/Output Summary (Last 24 hours) at 05/08/2024 9192 Last data filed at 05/07/2024 2031 Gross per 24 hour  Intake 480 ml  Output --  Net 480 ml     Assessment/Plan:  68 y.o. female with headache and vision changes  Plan is for left temporal artery biopsy today with Dr. Pearline Surgery was discussed in detail with patient she is agreeable to proceed Continue n.p.o. Consent ordered    Donnice Sender, PA-C [facility] [phone] 05/08/2024 8:07 AM  VASCULAR STAFF ADDENDUM: I have independently interviewed and examined the patient. I agree with the above.     Norman GORMAN Pearline MD [facility] Phone Number: [phone] 05/08/2024 8:30 AM

## 2024-05-08 NOTE — Care Management Obs Status (Cosign Needed)
 MEDICARE OBSERVATION STATUS NOTIFICATION   Patient Details  Name: Vanessa Lara MRN: [id number] Date of Birth: 08/19/1956   Medicare Observation Status Notification Given:  Yes    Rosaline JONELLE Joe, RN 05/08/2024, 2:37 PM

## 2024-05-08 NOTE — Anesthesia Postprocedure Evaluation (Signed)
 Anesthesia Post Note  Patient: Vanessa Lara  Procedure(s) Performed: LEFT TEMPORAL ARTERY BIOPSY (Left: Face)     Patient location during evaluation: PACU Anesthesia Type: MAC Level of consciousness: awake Pain management: pain level controlled Vital Signs Assessment: post-procedure vital signs reviewed and stable Respiratory status: spontaneous breathing, nonlabored ventilation and respiratory function stable Cardiovascular status: blood pressure returned to baseline and stable Postop Assessment: no apparent nausea or vomiting Anesthetic complications: no   No notable events documented.  Last Vitals:  Vitals:   05/08/24 1000 05/08/24 1015  BP: 103/61 96/75  Pulse: 86 83  Resp: 11 16  Temp: 36.9 C (!) 36.3 C  SpO2: 97% 97%    Last Pain:  Vitals:   05/08/24 1015  TempSrc:   PainSc: 0-No pain                 Mollye Guinta P Jailan Trimm

## 2024-05-08 NOTE — Plan of Care (Signed)

## 2024-05-08 NOTE — Anesthesia Preprocedure Evaluation (Addendum)
 Anesthesia Evaluation  Patient identified by MRN, date of birth, ID band Patient awake    Reviewed: Allergy & Precautions, NPO status , Patient's Chart, lab work & pertinent test results  Airway Mallampati: II  TM Distance: >3 FB Neck ROM: Full    Dental  (+) Edentulous Upper   Pulmonary Current Smoker and Patient abstained from smoking.   Pulmonary exam normal        Cardiovascular hypertension, +CHF  Normal cardiovascular exam+ Valvular Problems/Murmurs MVP      Neuro/Psych  PSYCHIATRIC DISORDERS Anxiety Depression    CVA    GI/Hepatic negative GI ROS, Neg liver ROS,,,  Endo/Other  negative endocrine ROS    Renal/GU negative Renal ROS     Musculoskeletal  (+) Arthritis ,    Abdominal   Peds  Hematology  (+) Blood dyscrasia, anemia   Anesthesia Other Findings non intratable headache  Reproductive/Obstetrics                              Anesthesia Physical Anesthesia Plan  ASA: 3  Anesthesia Plan: MAC   Post-op Pain Management:    Induction:   PONV Risk Score and Plan: 1 and Ondansetron , Dexamethasone , Propofol  infusion, Midazolam  and Treatment may vary due to age or medical condition  Airway Management Planned: Simple Face Mask  Additional Equipment:   Intra-op Plan:   Post-operative Plan: Extubation in OR  Informed Consent: I have reviewed the patients History and Physical, chart, labs and discussed the procedure including the risks, benefits and alternatives for the proposed anesthesia with the patient or authorized representative who has indicated his/her understanding and acceptance.   Patient has DNR.  Discussed DNR with patient.   Dental advisory given  Plan Discussed with: CRNA  Anesthesia Plan Comments: (Patient requests no chest compressions )         Anesthesia Quick Evaluation

## 2024-05-08 NOTE — Plan of Care (Signed)

## 2024-05-08 NOTE — Discharge Summary (Signed)
 [facility] Discharge Summary   Patient: Vanessa Lara FMW:[id number]  PCP: Suanne Pfeiffer, NP  Date of admission: 05/07/2024   Date of discharge:  05/08/2024     Discharge Diagnoses:  Principal Problem:   Hyponatremia Active Problems:   Chronic gastritis without bleeding   Temporal arteritis syndrome (HCC)   Hypotension   Primary osteoarthritis involving multiple joints   Generalized anxiety disorder   Iron  deficiency anemia   Physical deconditioning   Mixed hyperlipidemia   Rheumatoid arthritis involving multiple sites with positive rheumatoid factor (HCC)   History of CVA (cerebrovascular accident)   Admitted From: Home Disposition:  Home   Recommendations for Outpatient Follow-up:  Follow-up with PCP within 1 week.  Patient wanted to go home and does not want to wait for the temporal artery biopsy report.  So patient is being discharged on prednisone  40 mg p.o. daily for 7 days, followed by 30 mg p.o. daily.  Patient will need long-term steroids and tapering if her biopsy report is positive otherwise she does not need steroids.  I explained to the patient to stop taking steroids if the biopsy report is negative otherwise follow with the PCP for further management as an outpatient. Patient verbalized understanding and wanted to go home.  She was cleared by vascular surgery. Follow up LABS/TEST: Repeat BMP after 1 week   Diet recommendation: Regular diet  Activity: The patient is advised to gradually reintroduce usual activities, as tolerated  Discharge Condition: stable  Code Status: Full code   History of present illness: As per the H and P dictated on admission. Hospital Course:  Vanessa Lara is a 68 year old female with history of rheumatoid arthritis, stroke, without residual findings, HTN, HLD, presenting with chief complaint of headaches. Per patient headache onset has been gradual on the side of the head initially left side now radiated across the forehead  and to the right temporal area.  Also reported may have some visual changes but denies have any visual loss.  Denies of any double vision, neck pain, stiffness, asymmetric weakness or change in her speech.  Denies having numbness and tingling.  Reporting chills but no fever.   ED Evaluation: Blood pressure (!) 98/42, pulse 85, temperature 98 F (36.7 C), temperature source Oral, resp. rate (!) 21, SpO2 94%.   Reporting intensity of her headache 10 /10, no relief from NSAIDs or muscle relaxants,   In the ED CT of the head was negative Labs: Sodium 125 (yesterday 134.  CRP 5.2, ESR 65 >> 51   EDP Dr. Jakie discussed the case with vascular surgeon Dr. Serene at [facility] who recommended patient to be admitted for evaluation and possibly biopsy   So, pt was admitted at [facility] for hyponatremia, ruling out temporal arteritis. Oral prednisone  was initiated.   Patient was transferred to [facility] from [facility].  Vascular surgery was consulted and further management as below.    Assessment and Plan: # Hyponatremia: Resolved  - Unknown etiology unknown etiology -Recent serum sodium 134, it was 125 at [facility] and Na 139 at [facility], within normal range. Pt is on lasix  prn at home. Recommended to follow with PCP and repeat BMP after 1 week.  # Hypotension: Blood pressure remained soft IV fluid was given during hospital stay.  Patient was asymptomatic.  Recommended to follow with PCP for further management as an outpatient if she becomes symptomatic.  Monitor BP at home.    Suspected temporal arteritis syndrome Left sided temporal pain-headache Ruling out left-sided  temporal arthritis (versus atypical migraine headaches-although patient has no history of migraine headaches)  -ESR 65-> 51 (dilated) -CRP normal at 5.2, -WBC 11.1 -CT head-negative -EDP Dr. Jakie discussed the case with vascular surgeon Dr. Serene at [facility] who recommended patient to be admitted for evaluation and possibly biopsy. S/p oral prednisone ,  Benadryl , 8/7 left temporal artery biopsy was done by vascular surgery.  Patient was cleared to discharge on oral prednisone  and follow-up as an outpatient for further management. - Allergic to hydrocodone , s/p NSAIDs, for severe pain Fentanyl  (hypotensive) Patient was discharged on prednisone  40 mg p.o. daily for 7 days followed by 30 mg p.o. daily for 7 days.  Recommended to follow-up with PCP and vascular surgery for further management after biopsy report and if the biopsy report is negative then prednisone  can be discontinued.  Started PPI for GI prophylaxis.    # Chronic gastritis without bleeding Continue PPI, Carafate    # Generalized anxiety disorder - Continue home medication including as needed Xanax , Atarax , Continue home medication of Paxil    # Primary osteoarthritis involving multiple joints - Follow-up with rheumatologist as an outpatient     # Mixed hyperlipidemia: resume statins    # Iron  deficiency anemia: Patient is stable, continue to monitor. Continue iron  supplements   # Rheumatoid arthritis involving multiple sites with positive rheumatoid factor. Extensive history of rheumatoid arthritis, -Home analgesics including fentanyl  patch, lidocaine , Voltaren  gel. on Plaquenil     Body mass index is 12.06 kg/m.  Nutrition Interventions:  - Patient was instructed, not to drive, operate heavy machinery, perform activities at heights, swimming or participation in water  activities or provide baby sitting services while on Pain, Sleep and Anxiety Medications; until her outpatient Physician has advised to do so again.  - Also recommended to not to take more than prescribed Pain, Sleep and Anxiety Medications.  Patient was ambulatory without any assistance. On the day of the discharge the patient's vitals were stable, and no other acute medical condition were reported by patient. the patient was felt safe to be discharge at Home.  Consultants: Vascular surgery Procedures:  Left temporal artery biopsy  Discharge Exam: General: Appear in no distress, no Rash; Oral Mucosa Clear, moist. Cardiovascular: S1 and S2 Present, no Murmur, Respiratory: normal respiratory effort, Bilateral Air entry present and no Crackles, no wheezes Abdomen: Bowel Sound present, Soft and no tenderness, no hernia Extremities: no Pedal edema, no calf tenderness Neurology: alert and oriented to time, place, and person affect appropriate.  Filed Weights   05/07/24 2318 05/08/24 0421  Weight: 28 kg 28 kg   Vitals:   05/08/24 1000 05/08/24 1015  BP: 103/61 96/75  Pulse: 86 83  Resp: 11 16  Temp: 98.4 F (36.9 C) (!) 97.4 F (36.3 C)  SpO2: 97% 97%    DISCHARGE MEDICATION: Allergies as of 05/08/2024       Reactions   Abatacept  Other (See Comments)   Codeine  Other (See Comments)   Stomach pains/constipation- due to IBS   Golimumab  Other (See Comments)   Hydrocodone  Other (See Comments)   She does not want to take this due to intolerance to codeine .   Methylprednisolone  Diarrhea, Other (See Comments)   Made my IBS flare up  Diarrhea, nausea   Methotrexate Derivatives Other (See Comments)   Agitation; pt stated, I get no sleep; one dose caused no sleep for 7 days and 7 nights - that was when I got a 0.5 injection   Prednisone  Rash   Cannot take prednisone   by mouth ---- red rash all over Patient states she got a rash one time  Patient states she can take regular prednisone  but not the packs.        Medication List     TAKE these medications    acetaminophen  500 MG tablet Commonly known as: TYLENOL  Take 1,000 mg by mouth every 6 (six) hours as needed for moderate pain (pain score 4-6) or headache.   ALPRAZolam  0.25 MG tablet Commonly known as: XANAX  Take 0.25 mg by mouth 2 (two) times daily as needed.   ASPIRIN  81 PO Take 1 tablet by mouth daily.   atorvastatin  10 MG tablet Commonly known as: LIPITOR Take by mouth.   diclofenac  Sodium 1 % Gel Commonly  known as: VOLTAREN  SMARTSIG:Gram(s) Topical 4 Times Daily PRN   docusate sodium  100 MG capsule Commonly known as: COLACE TAKE 1 CAPSULE(100 MG) BY MOUTH TWICE DAILY   fentaNYL  50 MCG/HR Commonly known as: DURAGESIC  Place 1 patch onto the skin every other day. Patient states she does it every other day.   folic acid  1 MG tablet Commonly known as: FOLVITE  Take 1 mg by mouth daily.   furosemide  20 MG tablet Commonly known as: LASIX  Take 20 mg by mouth daily as needed.   hydroxychloroquine  200 MG tablet Commonly known as: PLAQUENIL  Take daily 5 days out of the week What changed:  how much to take how to take this when to take this additional instructions   hydrOXYzine  25 MG tablet Commonly known as: ATARAX  Take 25 mg by mouth 2 (two) times daily.   lidocaine  5 % Commonly known as: LIDODERM  one patch daily.   Myrbetriq 50 MG Tb24 tablet Generic drug: mirabegron ER Take 50 mg by mouth daily.   naloxone 4 MG/0.1ML Liqd nasal spray kit Commonly known as: NARCAN   pantoprazole  40 MG tablet Commonly known as: PROTONIX  TAKE 1 TABLET(40 MG) BY MOUTH TWICE DAILY   PARoxetine  20 MG tablet Commonly known as: PAXIL  Take 20 mg by mouth daily.   predniSONE  20 MG tablet Commonly known as: DELTASONE  Take 2 tablets (40 mg total) by mouth daily with breakfast for 7 days, THEN 1.5 tablets (30 mg total) daily with breakfast for 7 days. Start taking on: May 09, 2024   RECLAST  IV Inject 5 mg into the vein. Once yearly   sucralfate  1 GM/10ML suspension Commonly known as: CARAFATE  SHAKE LIQUID AND TAKE 10 ML(1 GRAM) BY MOUTH FOUR TIMES DAILY   ThermaCare Back/Hip Misc 1 application by Does not apply route daily.   tiZANidine  4 MG tablet Commonly known as: ZANAFLEX  TAKE 1 TABLET(4 MG) BY MOUTH EVERY 8 HOURS AS NEEDED FOR MUSCLE SPASMS   traZODone  100 MG tablet Commonly known as: DESYREL  Take 1 tablet (100 mg total) by mouth at bedtime as needed for sleep.   VITAMIN C  PO Take by mouth.   Vitamin D3 25 MCG (1000 UT) Caps 1 capsule Orally Once a day for 30 day(s)       Allergies  Allergen Reactions   Abatacept  Other (See Comments)   Codeine  Other (See Comments)    Stomach pains/constipation- due to IBS   Golimumab  Other (See Comments)   Hydrocodone  Other (See Comments)    She does not want to take this due to intolerance to codeine .   Methylprednisolone  Diarrhea and Other (See Comments)    Made my IBS flare up  Diarrhea, nausea   Methotrexate Derivatives Other (See Comments)    Agitation; pt stated, I  get no sleep; one dose caused no sleep for 7 days and 7 nights - that was when I got a 0.5 injection   Prednisone  Rash    Cannot take prednisone  by mouth ---- red rash all over  Patient states she got a rash one time   Patient states she can take regular prednisone  but not the packs.   Discharge Instructions     Call MD for:  extreme fatigue   Complete by: As directed    Call MD for:  persistant dizziness or light-headedness   Complete by: As directed    Call MD for:  redness, tenderness, or signs of infection (pain, swelling, redness, odor or green/yellow discharge around incision site)   Complete by: As directed    Call MD for:  severe uncontrolled pain   Complete by: As directed    Call MD for:  temperature >100.4   Complete by: As directed    Diet general   Complete by: As directed    Discharge instructions   Complete by: As directed    Follow-up with PCP within 1 week.  Patient wanted to go home and does not want to wait for the temporal artery biopsy report.  So patient is being discharged on prednisone  40 mg p.o. daily for 7 days, followed by 30 mg p.o. daily.  Patient will need long-term steroids and tapering if her biopsy report is positive otherwise she does not need steroids.  I explained to the patient to stop taking steroids if the biopsy report is negative otherwise follow with the PCP for further management as an  outpatient. Patient verbalized understanding and wanted to go home.  She was cleared by vascular surgery.   Increase activity slowly   Complete by: As directed    No wound care   Complete by: As directed        The results of significant diagnostics from this hospitalization (including imaging, microbiology, ancillary and laboratory) are listed below for reference.    Significant Diagnostic Studies: CT HEAD WO CONTRAST Result Date: 05/07/2024 EXAM: CT HEAD WITHOUT CONTRAST 05/07/2024 08:42:03 AM TECHNIQUE: CT of the head was performed without the administration of intravenous contrast. Automated exposure control, iterative reconstruction, and/or weight based adjustment of the mA/kV was utilized to reduce the radiation dose to as low as reasonably achievable. COMPARISON: MRI head dated 10/17/2022. CLINICAL HISTORY: Headache. Patient complains of migraine for a couple of weeks and states she is scared she had a stroke, stroke screen negative per EMS, capillary blood glucose 92, rates headache 10/10 and has a history of migraines. FINDINGS: BRAIN AND VENTRICLES: No acute hemorrhage. Gray-white differentiation is preserved. No hydrocephalus. No extra-axial collection. No mass effect or midline shift. ORBITS: Bilateral lens replacement. SINUSES: Mucosal thickening in the sphenoid sinuses. SOFT TISSUES AND SKULL: Mild atherosclerosis of the skull base involving the carotid siphons. No acute soft tissue abnormality. No skull fracture. IMPRESSION: 1. No acute intracranial abnormality related to the patient's history of migraines. Electronically signed by: Donnice Mania MD 05/07/2024 09:17 AM EDT RP Workstation: [id number]   XR Lumbar Spine 2-3 Views Result Date: 04/16/2024 [facility], oblique, lateral views of lumbar spine reviewed.  No acute fracture or spondylolisthesis noted.  There is significant scoliosis.  Loss of lordosis is present.   Microbiology: No results found for this or any previous visit (from the  past 240 hours).   Labs: CBC: Recent Labs  Lab 05/07/24 0750 05/08/24 0246  WBC 11.1* 6.3  NEUTROABS 8.6*  --  HGB 10.0* 8.4*  HCT 30.3* 26.3*  MCV 88.6 88.6  PLT 318 245   Basic Metabolic Panel: Recent Labs  Lab 05/06/24 1148 05/07/24 0750 05/07/24 0949 05/07/24 1303 05/08/24 0246  NA 134* 125* 125*  --  139  K 3.7 4.1  --   --  3.5  CL 98 93*  --   --  109  CO2 26 21*  --   --  24  GLUCOSE 72 84  --   --  93  BUN 9 10  --   --  <5*  CREATININE 0.56 0.49  --   --  0.54  CALCIUM  9.2 8.5*  --   --  8.1*  MG  --   --   --  1.9  --   PHOS  --   --   --  2.3*  --    Liver Function Tests: Recent Labs  Lab 05/06/24 1148 05/07/24 0750  AST 20 28  ALT 11 13  ALKPHOS  --  96  BILITOT 0.3 0.7  PROT 7.3 7.0  ALBUMIN  --  3.6   No results for input(s): LIPASE, AMYLASE in the last 168 hours. No results for input(s): AMMONIA in the last 168 hours. Cardiac Enzymes: Recent Labs  Lab 05/07/24 1303  CKTOTAL 59  CKMB 4.3   BNP (last 3 results) No results for input(s): BNP in the last 8760 hours. CBG: No results for input(s): GLUCAP in the last 168 hours.  Time spent: 35 minutes  Signed:  Elvan Sor  [facility] 05/08/2024 2:07 PM

## 2024-05-09 ENCOUNTER — Encounter (HOSPITAL_COMMUNITY): Payer: Self-pay | Admitting: Vascular Surgery

## 2024-05-09 LAB — C-REACTIVE PROTEIN: CRP: 5.2 mg/L (ref <8.0)

## 2024-05-09 LAB — COMPREHENSIVE METABOLIC PANEL WITH GFR
AG Ratio: 1.6 (calc) (ref 1.0–2.5)
ALT: 11 U/L (ref 6–29)
AST: 20 U/L (ref 10–35)
Albumin: 4.5 g/dL (ref 3.6–5.1)
Alkaline phosphatase (APISO): 109 U/L (ref 37–153)
BUN: 9 mg/dL (ref 7–25)
CO2: 26 mmol/L (ref 20–32)
Calcium: 9.2 mg/dL (ref 8.6–10.4)
Chloride: 98 mmol/L (ref 98–110)
Creat: 0.56 mg/dL (ref 0.50–1.05)
Globulin: 2.8 g/dL (ref 1.9–3.7)
Glucose, Bld: 72 mg/dL (ref 65–99)
Potassium: 3.7 mmol/L (ref 3.5–5.3)
Sodium: 134 mmol/L — ABNORMAL LOW (ref 135–146)
Total Bilirubin: 0.3 mg/dL (ref 0.2–1.2)
Total Protein: 7.3 g/dL (ref 6.1–8.1)
eGFR: 100 mL/min/1.73m2 (ref >=60)

## 2024-05-09 LAB — QUANTIFERON-TB GOLD PLUS
Mitogen-NIL: 2.29 [IU]/mL
NIL: 0.01 [IU]/mL
QuantiFERON-TB Gold Plus: NEGATIVE
TB1-NIL: 0 [IU]/mL
TB2-NIL: 0 [IU]/mL

## 2024-05-09 LAB — SEDIMENTATION RATE: Sed Rate: 65 mm/h — ABNORMAL HIGH (ref 0–30)

## 2024-05-10 ENCOUNTER — Other Ambulatory Visit: Payer: Self-pay | Admitting: Internal Medicine

## 2024-05-10 DIAGNOSIS — M0579 Rheumatoid arthritis with rheumatoid factor of multiple sites without organ or systems involvement: Secondary | ICD-10-CM

## 2024-05-12 ENCOUNTER — Encounter: Admitting: Internal Medicine

## 2024-05-12 ENCOUNTER — Other Ambulatory Visit: Payer: Self-pay | Admitting: *Deleted

## 2024-05-12 ENCOUNTER — Other Ambulatory Visit: Payer: Self-pay | Admitting: Emergency Medicine

## 2024-05-12 VITALS — BP 136/77 | HR 107 | Temp 97.6°F | Resp 16

## 2024-05-12 DIAGNOSIS — D509 Iron deficiency anemia, unspecified: Secondary | ICD-10-CM

## 2024-05-12 DIAGNOSIS — E860 Dehydration: Secondary | ICD-10-CM

## 2024-05-12 DIAGNOSIS — D500 Iron deficiency anemia secondary to blood loss (chronic): Secondary | ICD-10-CM

## 2024-05-12 LAB — SURGICAL PATHOLOGY

## 2024-05-12 MED ORDER — SODIUM CHLORIDE 0.9 % IV SOLN
510.0000 mg | Freq: Once | INTRAVENOUS | Status: AC
  Administered 2024-05-12 (×2): 510 mg via INTRAVENOUS
  Filled 2024-05-12: qty 17

## 2024-05-12 NOTE — Telephone Encounter (Signed)
 Last Fill: 01/07/2024  Eye exam: Patient state she tried to get an eye exam scheduled but they don't have anything till next year. Please advise   Labs: 05/08/2024 RBC 2.97 Hemoglobin 8.4 HCT 26.3 RDW 18.0 BUN <5 Calcium  8.1   Next Visit: 08/06/2024  Last Visit: 05/06/2024  DX: Rheumatoid arthritis involving multiple sites with positive rheumatoid factor   Current Dose per office note 05/06/2024: HCQ 200 mg daily x5 days weekly   Okay to refill Plaquenil ?

## 2024-05-12 NOTE — Progress Notes (Signed)
 Diagnosis: Iron  Deficiency Anemia  Provider:  Pennington,Rebekah  Procedure: IV Infusion  IV Type: Peripheral, IV Location: R Antecubital  Feraheme  (Ferumoxytol ), Dose: 510 mg  Infusion Start Time: 1413  Infusion Stop Time: 1429  Post Infusion IV Care: Observation period completed  Discharge: Condition: Good, Destination: Home . AVS Provided  Performed by:  Blanca Selinda SAUNDERS, LPN

## 2024-05-12 NOTE — Addendum Note (Signed)
 Addended by: BLANCA SELINDA SAUNDERS on: 05/12/2024 03:34 PM   Modules accepted: Orders

## 2024-05-13 LAB — COMPREHENSIVE METABOLIC PANEL WITH GFR
AG Ratio: 1.6 (calc) (ref 1.0–2.5)
ALT: 10 U/L (ref 6–29)
AST: 19 U/L (ref 10–35)
Albumin: 4.2 g/dL (ref 3.6–5.1)
Alkaline phosphatase (APISO): 129 U/L (ref 37–153)
BUN: 9 mg/dL (ref 7–25)
CO2: 29 mmol/L (ref 20–32)
Calcium: 9.2 mg/dL (ref 8.6–10.4)
Chloride: 99 mmol/L (ref 98–110)
Creat: 0.58 mg/dL (ref 0.50–1.05)
Globulin: 2.7 g/dL (ref 1.9–3.7)
Glucose, Bld: 105 mg/dL — ABNORMAL HIGH (ref 65–99)
Potassium: 4 mmol/L (ref 3.5–5.3)
Sodium: 137 mmol/L (ref 135–146)
Total Bilirubin: 0.3 mg/dL (ref 0.2–1.2)
Total Protein: 6.9 g/dL (ref 6.1–8.1)
eGFR: 99 mL/min/1.73m2 (ref >=60)

## 2024-05-13 NOTE — Telephone Encounter (Signed)
 Patient's temporal biopsy positive. D/w Dr. Jeannetta - we will move forward with Actemra  162mg  SUBCUT every 7 days  Sherry Pennant, PharmD, MPH, BCPS, CPP Clinical Pharmacist (Rheumatology and Pulmonology)

## 2024-05-14 ENCOUNTER — Other Ambulatory Visit: Payer: Self-pay

## 2024-05-14 NOTE — Progress Notes (Signed)
 Sed rate was elevated at 65 higher than her previous values but CRP was normal.  CMP showed very slightly low sodium probably related to limited diet intake.  Her QuantiFERON test was negative.

## 2024-05-15 ENCOUNTER — Encounter: Payer: Self-pay | Admitting: Oncology

## 2024-05-15 ENCOUNTER — Telehealth: Payer: Self-pay

## 2024-05-15 NOTE — Telephone Encounter (Signed)
 Patient with newly diagnosed GCA  Submitted a Prior Authorization request to [employer] for ACTEMRA  SQ via CoverMyMeds. Will update once we receive a response.  Key: Vanessa Lara

## 2024-05-16 ENCOUNTER — Other Ambulatory Visit (HOSPITAL_COMMUNITY): Payer: Self-pay

## 2024-05-16 NOTE — Telephone Encounter (Signed)
 Received notification from [facility] regarding a prior authorization for ACTEMRA  SQ. Authorization has been APPROVED from 05/15/2024 to 10/01/2024. Approval letter sent to scan center.  Per test claim, copay for 28 days supply is $0  Patient can be scheduled for Actemra  new start. Will need counseling and consent at new start visit  Patient can fill through [facility]: [phone]

## 2024-05-20 ENCOUNTER — Emergency Department (HOSPITAL_COMMUNITY)
Admission: EM | Admit: 2024-05-20 | Discharge: 2024-05-20 | Disposition: A | Discharge status: Home / Self Care | Attending: Emergency Medicine | Admitting: Emergency Medicine

## 2024-05-20 ENCOUNTER — Inpatient Hospital Stay: Attending: Hematology

## 2024-05-20 ENCOUNTER — Other Ambulatory Visit: Payer: Self-pay

## 2024-05-20 ENCOUNTER — Encounter (HOSPITAL_COMMUNITY): Payer: Self-pay | Admitting: Emergency Medicine

## 2024-05-20 ENCOUNTER — Telehealth: Payer: Self-pay

## 2024-05-20 DIAGNOSIS — Z7982 Long term (current) use of aspirin: Secondary | ICD-10-CM | POA: Insufficient documentation

## 2024-05-20 DIAGNOSIS — I110 Hypertensive heart disease with heart failure: Secondary | ICD-10-CM | POA: Diagnosis not present

## 2024-05-20 DIAGNOSIS — F17210 Nicotine dependence, cigarettes, uncomplicated: Secondary | ICD-10-CM | POA: Diagnosis not present

## 2024-05-20 DIAGNOSIS — E8809 Other disorders of plasma-protein metabolism, not elsewhere classified: Secondary | ICD-10-CM | POA: Diagnosis not present

## 2024-05-20 DIAGNOSIS — R000 Tachycardia, unspecified: Secondary | ICD-10-CM | POA: Insufficient documentation

## 2024-05-20 DIAGNOSIS — I509 Heart failure, unspecified: Secondary | ICD-10-CM | POA: Insufficient documentation

## 2024-05-20 DIAGNOSIS — R531 Weakness: Secondary | ICD-10-CM | POA: Diagnosis present

## 2024-05-20 LAB — COMPREHENSIVE METABOLIC PANEL WITH GFR
ALT: 11 U/L (ref 0–44)
AST: 22 U/L (ref 15–41)
Albumin: 3.4 g/dL — ABNORMAL LOW (ref 3.5–5.0)
Alkaline Phosphatase: 122 U/L (ref 38–126)
Anion gap: 13 (ref 5–15)
BUN: 7 mg/dL — ABNORMAL LOW (ref 8–23)
CO2: 23 mmol/L (ref 22–32)
Calcium: 8.8 mg/dL — ABNORMAL LOW (ref 8.9–10.3)
Chloride: 103 mmol/L (ref 98–111)
Creatinine, Ser: 0.52 mg/dL (ref 0.44–1.00)
GFR, Estimated: >60 mL/min (ref >60)
Glucose, Bld: 85 mg/dL (ref 70–99)
Potassium: 3.8 mmol/L (ref 3.5–5.1)
Sodium: 139 mmol/L (ref 135–145)
Total Bilirubin: 0.5 mg/dL (ref 0.0–1.2)
Total Protein: 6.7 g/dL (ref 6.5–8.1)

## 2024-05-20 LAB — CBC WITH DIFFERENTIAL/PLATELET
Abs Immature Granulocytes: 0.03 K/uL (ref 0.00–0.07)
Basophils Absolute: 0 K/uL (ref 0.0–0.1)
Basophils Relative: 0 %
Eosinophils Absolute: 0 K/uL (ref 0.0–0.5)
Eosinophils Relative: 0 %
HCT: 37.1 % (ref 36.0–46.0)
Hemoglobin: 12 g/dL (ref 12.0–15.0)
Immature Granulocytes: 0 %
Lymphocytes Relative: 18 %
Lymphs Abs: 1.8 K/uL (ref 0.7–4.0)
MCH: 29.6 pg (ref 26.0–34.0)
MCHC: 32.3 g/dL (ref 30.0–36.0)
MCV: 91.4 fL (ref 80.0–100.0)
Monocytes Absolute: 0.5 K/uL (ref 0.1–1.0)
Monocytes Relative: 5 %
Neutro Abs: 7.5 K/uL (ref 1.7–7.7)
Neutrophils Relative %: 77 %
Platelets: 453 K/uL — ABNORMAL HIGH (ref 150–400)
RBC: 4.06 MIL/uL (ref 3.87–5.11)
RDW: 18.2 % — ABNORMAL HIGH (ref 11.5–15.5)
WBC: 9.9 K/uL (ref 4.0–10.5)
nRBC: 0 % (ref 0.0–0.2)

## 2024-05-20 LAB — TYPE AND SCREEN
ABO/RH(D): A POS
Antibody Screen: NEGATIVE

## 2024-05-20 LAB — LIPASE, BLOOD: Lipase: 39 U/L (ref 11–51)

## 2024-05-20 MED ORDER — TIZANIDINE HCL 2 MG PO TABS
1.0000 mg | ORAL_TABLET | Freq: Once | ORAL | Status: DC
  Filled 2024-05-20: qty 0.5

## 2024-05-20 MED ORDER — ALPRAZOLAM 0.5 MG PO TABS
0.2500 mg | ORAL_TABLET | Freq: Once | ORAL | Status: AC
  Administered 2024-05-20: 0.25 mg via ORAL
  Filled 2024-05-20: qty 1

## 2024-05-20 MED ORDER — SODIUM CHLORIDE 0.9 % IV BOLUS
500.0000 mL | Freq: Once | INTRAVENOUS | Status: AC
  Administered 2024-05-20: 500 mL via INTRAVENOUS

## 2024-05-20 NOTE — ED Provider Notes (Signed)
 [place] EMERGENCY DEPARTMENT AT [facility] Provider Note   CSN: [id number] Arrival date & time: 05/20/24  1301     Patient presents with: Weakness   Vanessa Lara is a 68 y.o. female with history significant for anemia, anxiety, rheumatoid arthritis, CHF, MVP, hypertension, history of CVA who was recently admitted for significant hyponatremia presenting with a 1 week history of worsening generalized weakness.  She denies any specific other symptoms other than generalized fatigue, stating the last time she felt this way her sodium level was low.  She does endorse having diarrhea for about 3 days which stopped 2 days ago after a dose of ibuprofen .  She denies fevers or chills, chest pain, shortness of breath, no abdominal pain.  She denies dysuria, dizziness, headache.   The history is provided by the patient.       Prior to Admission medications   Medication Sig Start Date End Date Taking? Authorizing Provider  acetaminophen  (TYLENOL ) 500 MG tablet Take 1,000 mg by mouth every 6 (six) hours as needed for moderate pain (pain score 4-6) or headache.    [provider]  ALPRAZolam  (XANAX ) 0.25 MG tablet Take 0.25 mg by mouth 2 (two) times daily as needed. 02/22/24   [provider]  Ascorbic Acid (VITAMIN C PO) Take by mouth.    [provider]  ASPIRIN  81 PO Take 1 tablet by mouth daily.    [provider]  atorvastatin  (LIPITOR) 10 MG tablet Take by mouth. 09/17/23   [provider]  Cholecalciferol (VITAMIN D3) 25 MCG (1000 UT) CAPS 1 capsule Orally Once a day for 30 day(s)    [provider]  diclofenac  Sodium (VOLTAREN ) 1 % GEL SMARTSIG:Gram(s) Topical 4 Times Daily PRN 02/28/23   [provider]  docusate sodium  (COLACE) 100 MG capsule TAKE 1 CAPSULE(100 MG) BY MOUTH TWICE DAILY 03/04/24   Magnant, Charles L, PA-C  fentaNYL  (DURAGESIC ) 50 MCG/HR Place 1 patch onto the skin every other day. Patient states she does  it every other day.    [provider]  folic acid  (FOLVITE ) 1 MG tablet Take 1 mg by mouth daily.    [provider]  furosemide  (LASIX ) 20 MG tablet Take 20 mg by mouth daily as needed. 10/07/21   [provider]  Heat Wraps [facility] BACK/HIP) MISC 1 application by Does not apply route daily. 10/27/20   Hilts, Ozell, MD  hydroxychloroquine  (PLAQUENIL ) 200 MG tablet TAKE 1 TABLET ONCE A DAY ON 5 DAYS OUT OF THE WEEK 05/14/24   Rice, Lonni ORN, MD  hydrOXYzine  (ATARAX ) 25 MG tablet Take 25 mg by mouth 2 (two) times daily. 01/17/24   [provider]  lidocaine  (LIDODERM ) 5 % one patch daily. 10/06/21   [provider]  MYRBETRIQ 50 MG TB24 tablet Take 50 mg by mouth daily. 01/17/24   [provider]  naloxone [facility]) nasal spray 4 mg/0.1 mL  09/15/21   [provider]  pantoprazole  (PROTONIX ) 40 MG tablet TAKE 1 TABLET(40 MG) BY MOUTH TWICE DAILY 04/11/23   Legrand Victory LITTIE DOUGLAS, MD  PARoxetine  (PAXIL ) 20 MG tablet Take 20 mg by mouth daily. 12/29/22   [provider]  predniSONE  (DELTASONE ) 20 MG tablet Take 2 tablets (40 mg total) by mouth daily with breakfast for 7 days, THEN 1.5 tablets (30 mg total) daily with breakfast for 7 days. 05/09/24 05/23/24  Von Bellis, MD  sucralfate  (CARAFATE ) 1 GM/10ML suspension SHAKE LIQUID AND TAKE 10  ML(1 GRAM) BY MOUTH FOUR TIMES DAILY 08/24/23   [provider]  tiZANidine  (ZANAFLEX ) 4 MG tablet TAKE 1 TABLET(4 MG) BY MOUTH EVERY 8 HOURS AS NEEDED FOR MUSCLE SPASMS 05/01/24   Rice, Lonni ORN, MD  traZODone  (DESYREL ) 100 MG tablet Take 1 tablet (100 mg total) by mouth at bedtime as needed for sleep. 01/25/21   Hilts, Ozell, MD  Zoledronic  Acid (RECLAST  IV) Inject 5 mg into the vein. Once yearly    Jeannetta Lonni ORN, MD    Allergies: Abatacept , Codeine , Golimumab , Hydrocodone , Methylprednisolone , Methotrexate derivatives, and Prednisone     Review of Systems  Constitutional:   Positive for fatigue. Negative for fever.  HENT:  Negative for congestion and sore throat.   Eyes: Negative.   Respiratory:  Negative for chest tightness and shortness of breath.   Cardiovascular:  Negative for chest pain.  Gastrointestinal:  Negative for abdominal pain, nausea and vomiting.  Genitourinary: Negative.   Musculoskeletal:  Negative for arthralgias, joint swelling and neck pain.  Skin: Negative.  Negative for rash and wound.  Neurological:  Positive for weakness. Negative for dizziness, light-headedness, numbness and headaches.  Psychiatric/Behavioral: Negative.      Updated Vital Signs BP 130/71   Pulse (!) 112   Temp 98.3 F (36.8 C) (Oral)   Resp 18   Ht 5' (1.524 m)   Wt 29.5 kg   SpO2 95%   BMI 12.69 kg/m   Physical Exam Vitals and nursing note reviewed.  Constitutional:      Appearance: Normal appearance. She is well-developed. She is cachectic.  HENT:     Head: Normocephalic and atraumatic.  Eyes:     Conjunctiva/sclera: Conjunctivae normal.  Cardiovascular:     Rate and Rhythm: Regular rhythm. Tachycardia present.     Heart sounds: Normal heart sounds.  Pulmonary:     Effort: Pulmonary effort is normal.     Breath sounds: Normal breath sounds. No wheezing or rhonchi.  Abdominal:     General: Bowel sounds are normal.     Palpations: Abdomen is soft.     Tenderness: There is no abdominal tenderness. There is no guarding.  Musculoskeletal:        General: Deformity present. Normal range of motion.     Cervical back: Normal range of motion.     Comments: Chronic deformity hands noted c/w chronic RA changes  Skin:    General: Skin is warm and dry.     Findings: No erythema.  Neurological:     General: No focal deficit present.     Mental Status: She is alert and oriented to person, place, and time.     (all labs ordered are listed, but only abnormal results are displayed) Labs Reviewed  CBC WITH DIFFERENTIAL/PLATELET - Abnormal; Notable for  the following components:      Result Value   RDW 18.2 (*)    Platelets 453 (*)    All other components within normal limits  COMPREHENSIVE METABOLIC PANEL WITH GFR - Abnormal; Notable for the following components:   BUN 7 (*)    Calcium  8.8 (*)    Albumin 3.4 (*)    All other components within normal limits  LIPASE, BLOOD  URINALYSIS, ROUTINE W REFLEX MICROSCOPIC  TYPE AND SCREEN    EKG: EKG Interpretation Date/Time:  Tuesday May 20 2024 13:17:27 EDT Ventricular Rate:  116 PR Interval:  147 QRS Duration:  81 QT Interval:  331 QTC Calculation: 460 R Axis:   228  Text  Interpretation: Sinus tachycardia Right atrial enlargement Probable lateral infarct, old Anteroseptal infarct, age indeterminate Artifact in lead(s) III aVF V2 Confirmed by Franklyn Gills [phone]) on 05/20/2024 2:17:08 PM  Radiology: No results found.   Procedures   Medications Ordered in the ED  tiZANidine  (ZANAFLEX ) tablet 1 mg (1 mg Oral Not Given 05/20/24 1730)  sodium chloride  0.9 % bolus 500 mL (0 mLs Intravenous Stopped 05/20/24 1642)  ALPRAZolam  (XANAX ) tablet 0.25 mg (0.25 mg Oral Given 05/20/24 1730)                                    Medical Decision Making Patient presenting with generalized weakness which she states is a chronic condition but has significantly worsened over the past week.  Had a several day history of diarrhea which resolved after OTC Imodium.  Differential diagnosis including dehydration, electrolyte disturbance, low sodium, low potassium, she has noted to be tachycardic upon initial presentation, she denies dizziness with positional changes, states she has been hydrating which she feels has been adequately.  Patient was given IV fluids while awaiting lab results.  Although most of her labs have resulted we are still waiting for urinalysis when I was informed by RN that patient wanted to leave.  Before I was able to get into talk to this patient she had eloped the department.   Apparently she wanted to smoke a cigarette and was not willing to wait any longer.  Reassuringly her lab results are relatively stable, her sodium level is normal at 139, potassium 3.8 her creatinine is 0.52, she does have a low albumin at 3.4 and a BUN of 7.  Normal WBC count, also normal hemoglobin at 12.0 which is significantly improved from prior hemoglobins.  Of note, she is under the care of hematology and received Feraheme  infusion on August 11.  Amount and/or Complexity of Data Reviewed Labs: ordered.    Details: Per above. ECG/medicine tests: ordered.    Details: Sinus tachycardia with rate of 116, right atrial enlargement.  Risk Prescription drug management.        Final diagnoses:  Generalized weakness    ED Discharge Orders     None          Birdena Mliss RIGGERS 05/20/24 1800    Simon Lavonia SAILOR, MD 05/20/24 2128

## 2024-05-20 NOTE — ED Notes (Signed)
 Pt ambulated to restroom.

## 2024-05-20 NOTE — ED Triage Notes (Signed)
 Pt bib [facility] for weakness x the last week

## 2024-05-20 NOTE — Telephone Encounter (Signed)
 Patient left a VM hoping to get an update if [employer] would or wouldn't  pay for her Rheumatoid Bionic medication. Please advise

## 2024-05-20 NOTE — ED Notes (Signed)
 Pt given warm packs for her hands and back. Warm blankets, coffee and graham crackers.

## 2024-05-20 NOTE — Telephone Encounter (Signed)
 ATC patient on phone number in chart x 3. Phone does not ring and states that call cannot be completed  MyChart message sent to patient.  Will use Actemra  sample to start patient if needed  Feras Gardella, PharmD, MPH, BCPS, CPP Clinical Pharmacist (Rheumatology and Pulmonology)

## 2024-05-20 NOTE — ED Notes (Signed)
 Pt wished to leave AMA. Provider made aware.

## 2024-05-21 NOTE — Telephone Encounter (Signed)
 Patient scheduled for Actemra  new start on 05/22/2024. Will use sample and obtain consent.

## 2024-05-21 NOTE — Progress Notes (Signed)
 Pharmacy Note  Subjective:   Patient presents to clinic today to receive first dose of ACTEMRA  SQ for rheumatoid arthritis and recently diagnosis temporal arteritis. Patient currently takes hydroxychloroquine  200mg  once daily Monday through Friday only.  Upon discharge from ED, she was prescribed prednisone  40 mg p.o. daily for 7 days, followed by 30 mg p.o. daily. However states she is unable to take this high of a dose because it makes her feel that she is in space.  She did take prednisone  20mg  daily today and denies any issues. Actually reports feeling better since discharge after biopsy   Patient running a fever or have signs/symptoms of infection? No  Patient currently on antibiotics for the treatment of infection? No  Patient have any upcoming invasive procedures/surgeries? No  Objective: CMP     Component Value Date/Time   NA 139 05/20/2024 1535   NA 141 11/06/2019 1417   K 3.8 05/20/2024 1535   CL 103 05/20/2024 1535   CO2 23 05/20/2024 1535   GLUCOSE 85 05/20/2024 1535   BUN 7 (L) 05/20/2024 1535   BUN 10 11/06/2019 1417   CREATININE 0.52 05/20/2024 1535   CREATININE 0.58 05/12/2024 1350   CALCIUM  8.8 (L) 05/20/2024 1535   PROT 6.7 05/20/2024 1535   ALBUMIN 3.4 (L) 05/20/2024 1535   AST 22 05/20/2024 1535   AST 13 (L) 08/23/2020 1609   ALT 11 05/20/2024 1535   ALT 7 08/23/2020 1609   ALKPHOS 122 05/20/2024 1535   BILITOT 0.5 05/20/2024 1535   BILITOT 0.2 (L) 08/23/2020 1609   GFRNONAA >60 05/20/2024 1535   GFRNONAA >60 08/23/2020 1609   GFRAA 92 11/06/2019 1417    CBC    Component Value Date/Time   WBC 9.9 05/20/2024 1535   RBC 4.06 05/20/2024 1535   HGB 12.0 05/20/2024 1535   HGB 11.5 10/07/2020 1321   HCT 37.1 05/20/2024 1535   HCT 34.0 10/07/2020 1321   PLT 453 (H) 05/20/2024 1535   PLT 462 (H) 10/07/2020 1321   MCV 91.4 05/20/2024 1535   MCV 91 10/07/2020 1321   MCH 29.6 05/20/2024 1535   MCHC 32.3 05/20/2024 1535   RDW 18.2 (H) 05/20/2024 1535    RDW 13.7 10/07/2020 1321   LYMPHSABS 1.8 05/20/2024 1535   LYMPHSABS 2.9 10/07/2020 1321   MONOABS 0.5 05/20/2024 1535   EOSABS 0.0 05/20/2024 1535   EOSABS 0.1 10/07/2020 1321   BASOSABS 0.0 05/20/2024 1535   BASOSABS 0.1 10/07/2020 1321    Baseline Immunosuppressant Therapy Labs TB GOLD    Latest Ref Rng & Units 05/06/2024   11:48 AM  Quantiferon TB Gold  Quantiferon TB Gold Plus NEGATIVE NEGATIVE    Hepatitis Panel Hepatitis Panel negative on 10/30/2018  HIV Lab Results  Component Value Date   HIV Non Reactive 05/07/2024   Immunoglobulins    Latest Ref Rng & Units 10/18/2023    2:52 PM  Immunoglobulin Electrophoresis  IgG 586 - 1,602 mg/dL 386   IgM 26 - 782 mg/dL 877    SPEP    Latest Ref Rng & Units 05/20/2024    3:35 PM  Serum Protein Electrophoresis  Total Protein 6.5 - 8.1 g/dL 6.7    Chest x-ray: 5/86/7977 - 1. Stable pulmonary micronodules.  No further follow-up indicated. 2. Aortic Atherosclerosis (ICD10-I70.0) and Emphysema (ICD10-J43.9). 3. Otherwise no acute intrathoracic abnormality on this noncontrast study.   Assessment/Plan:  Briefly reviewed positive temporal arteritis result indicating need for oral steroid taper course. Reviewed risk for  complications with unmanaged GCA. Unfortunately she is unable to tolerate prednisone  doses > 20mg /day. She did take prednisone  20mg  daily today and denies any side effects at this dose  Counseled patient that Actemra  is an IL-6 blocking agent.  Counseled patient on purpose, proper use, and adverse effects of Actemra .  Reviewed the most common adverse effects including infections, infusion reactions, bowel injury, and rarely cancer and conditions of the nervous system.  Reviewed risk of lipid abnormalities and elevated liver enzymes. Discussed that there is the possibility of an increased risk of malignancy but it is not well understood if this increased risk is due to the medication or the disease state. Counseled  patient that Actemra  should be held prior to scheduled surgery for infections or new antibiotic use. Counseled patient that Actemra  should be held prior to scheduled surgery. Recommend annual influenza, PCV 15 or PCV20 or Pneumovax 23, and Shingrix as indicated.   Reviewed the importance of regular labs while on Actemra  therapy including the need for routine lipid panel.  Will recheck lipid panel after 4-8 weeks of starting Actemra , then every 6 months routinely. CBC and CMP will be monitored every 3 months. TB gold will be monitored annually. Standing orders placed.  Provided patient with medication education material and answered all questions.  Patient voiced understanding.  Patient consented to Actemra .  Will upload consent into the media tab.  Reviewed storage instructions of Actemra  with patient.   Reviewed importance of holding ACTEMRA  SQ with signs/symptoms of an infections, if antibiotics are prescribed to treat an active infection, and with invasive procedures  Demonstrated proper injection technique with Actemra  ActPen demo device  Patient able to demonstrate proper injection technique using the teach back method.  Patient self injected in the right upper thigh with:  Sample Medication: Actemra  ActPen 162mg /0.17mL NDC: [address]-13 Lot: [id number] Expiration: 12/2025  Patient tolerated well.  Observed for 30 mins in office for adverse reaction. Patient denies itchiness and irritation at injection., No swelling or redness noted., and Reviewed injection site reaction management with patient verbally and printed information for review in AVS  Patient is to return in 1 month for labs and 4-6 weeks for follow-up appointment.  Standing orders for CBC/CMP placed.  TB gold will be monitored yearly. Lipid panel will be monitored at 8 weeks then every 6 months.  ACTEMRA  SQ approved through insurance .   Rx sent to: [facility]: [phone] .  Patient provided with pharmacy phone  number and advised that [facility] will call  to schedule shipment to home.  Patient will continue ACTEMRA  SQ 162mg  subcut every 7 days.  Continue hydroxychloroquine  200mg  once daily x 5 days a week  Continue prednisone  as below per GiACTA trial  Week Prednisone  DAILY dose  1 20mg   2 15mg   3 12.5mg   4 12.5mg   5 10mg   6 9mg    Reference: Bethena PIER, Tuckwell, K, Dimonaco, S, et al. Trial of tocilizumab  in giant-cell arteritis. N Engl J Med 2017;377:317-328.   All questions encouraged and answered.  Instructed patient to call with any further questions or concerns.  Sherry Pennant, PharmD, MPH, BCPS, CPP Clinical Pharmacist (Rheumatology and Pulmonology)  05/21/2024 9:08 AM

## 2024-05-22 ENCOUNTER — Other Ambulatory Visit (HOSPITAL_COMMUNITY): Payer: Self-pay

## 2024-05-22 ENCOUNTER — Other Ambulatory Visit: Payer: Self-pay

## 2024-05-22 ENCOUNTER — Telehealth: Payer: Self-pay | Admitting: Physician Assistant

## 2024-05-22 ENCOUNTER — Ambulatory Visit: Admitting: Pharmacist

## 2024-05-22 DIAGNOSIS — E7800 Pure hypercholesterolemia, unspecified: Secondary | ICD-10-CM

## 2024-05-22 DIAGNOSIS — M316 Other giant cell arteritis: Secondary | ICD-10-CM | POA: Diagnosis not present

## 2024-05-22 DIAGNOSIS — Z7189 Other specified counseling: Secondary | ICD-10-CM

## 2024-05-22 DIAGNOSIS — Z79899 Other long term (current) drug therapy: Secondary | ICD-10-CM

## 2024-05-22 DIAGNOSIS — M0579 Rheumatoid arthritis with rheumatoid factor of multiple sites without organ or systems involvement: Secondary | ICD-10-CM | POA: Diagnosis not present

## 2024-05-22 MED ORDER — ACTEMRA ACTPEN 162 MG/0.9ML ~~LOC~~ SOAJ
162.0000 mg | SUBCUTANEOUS | 2 refills | Status: DC
  Filled 2024-05-27: qty 3.6, 28d supply, fill #0
  Filled 2024-06-19 – 2024-07-04 (×2): qty 3.6, 28d supply, fill #1
  Filled 2024-07-29 – 2024-08-01 (×2): qty 3.6, 28d supply, fill #2

## 2024-05-22 NOTE — Telephone Encounter (Signed)
 I received a call from the on-call service at 6:15PM on 8/2/125-patient reports having severe temporal artery trouble.  Patient was initiated on sq Actemra  today in the office--no side effects or complications.  She is currently prescribed prednisone  30 mg daily but has been unable to take prednisone  30 mg daily due to side effects-according to the patient it makes her go to [place] on that dose.  She took prednisone  10 mg this morning and 10 mg this evening with food but has remained symptomatic.   Advised the patient to take an additional 5 mg of prednisone  now.  If her symptoms persist or worsen the patient was advised to go to the ED for further evaluation and management. If she develops any vision changes she was advised to go to the ED.  Patient voiced understanding.  All questions were addressed.  I notified Dr. Jeannetta about the patient's call and symptoms she is experiencing-he plans to check on the patient's symptoms tomorrow.    Waddell Craze, PA-C

## 2024-05-22 NOTE — Patient Instructions (Addendum)
 Your next ACTEMRA  SQ dose is due on 05/29/2024, 06/05/2024, and every 7 days thereafter  Continue prednisone  and hydroxychloroquine   Week Prednisone  DAILY dose  1 20mg   2 15mg   3 12.5mg   4 12.5mg   5 10mg   6 9mg    HOLD ACTEMRA  SQ if you have signs or symptoms of an infection. You can resume once you feel better or back to your baseline. HOLD ACTEMRA  SQ if you start antibiotics to treat an infection. HOLD ACTEMRA  SQ around the time of surgery/procedures. Your surgeon will be able to provide recommendations on when to hold BEFORE and when you are cleared to RESUME.  Pharmacy information: Your prescription will be shipped from [facility]. Their phone number is [phone] Vanessa Lara will call to schedule shipment and confirm address. They will mail your medication to your home.  Labs are due in 1 month then every 3 months. Lab hours are from Monday to Thursday 8am-12:30pm and 1pm-4pm and Friday 8am-12pm. You do not need an appointment if you come for labs during these times. If you'd like to go to a [facility] or [facility] closer to home, please call our clinic 48 hours prior to lab date so we can release orders in a timely manner.  Stay up to date on all routine vaccines: influenza, pneumonia, COVID19, Shingles  How to manage an injection site reaction: Remember the 5 C's: COUNTER - leave on the counter at least 30 minutes but up to overnight to bring medication to room temperature. This may help prevent stinging COLD - place something cold (like an ice gel pack or cold water  bottle) on the injection site just before cleansing with alcohol. This may help reduce pain CLARITIN  - use Claritin  (generic name is loratadine ) for the first two weeks of treatment or the day of, the day before, and the day after injecting. This will help to minimize injection site reactions CORTISONE CREAM - apply if injection site is irritated and itching CALL ME - if injection site reaction is bigger  than the size of your fist, looks infected, blisters, or if you develop hives

## 2024-05-23 NOTE — Progress Notes (Signed)
 Starting  ACTEMRA  SQ for rheumatoid arthritis and recently diagnosis temporal arteritis. Patient currently takes hydroxychloroquine  200mg  once daily Monday through Friday only.   Upon discharge from ED, she was prescribed prednisone  40 mg p.o. daily for 7 days, followed by 30 mg p.o. daily. However states she is unable to take this high of a dose because it makes her feel that she is in space.  She did take prednisone  20mg  daily today and denies any issues. Actually reports feeling better since discharge after biopsy   Plan:   Briefly reviewed positive temporal arteritis result indicating need for oral steroid taper course. Reviewed risk for complications with unmanaged GCA. Unfortunately she is unable to tolerate prednisone  doses > 20mg /day. She did take prednisone  20mg  daily today and denies any side effects at this dose  Patient will continue ACTEMRA  SQ 162mg  subcut every 7 days.   Continue hydroxychloroquine  200mg  once daily x 5 days a week   Continue prednisone  as below per GiACTA trial   Week Prednisone  DAILY dose  1 20mg   2 15mg   3 12.5mg   4 12.5mg   5 10mg   6 9mg     Reference: Bethena PIER, Tuckwell, K, Dimonaco, S, et al. Trial of tocilizumab  in giant-cell arteritis. N Engl J Med 2017;377:317-328.   Sherry Pennant, PharmD, MPH, BCPS, CPP Clinical Pharmacist (Rheumatology and Pulmonology)

## 2024-05-25 ENCOUNTER — Telehealth: Payer: Self-pay | Admitting: Rheumatology

## 2024-05-25 NOTE — Telephone Encounter (Signed)
 I received a message from the answering service at 2:16 PM.  I returned patient's call.  Patient stated that she was given prednisone  for suspected temporal arteritis which she has been taking 5 mg in the morning 5 mg in the afternoon and 2.5 mg at bedtime.  She states even the dose makes her very jittery.  She states that she is not having any temporal headaches and she believes her headaches were related to stress.  I advised her to cut back the evening dose and just take the morning dose and afternoon dose.  She would like to see Dr. Jeannetta as soon as possible.  Her next appointment is July 02, 2024.  I advised her to call the office tomorrow morning to see if she can get an earlier appointment. Patient voiced understanding. Maya Nash, MD

## 2024-05-26 ENCOUNTER — Telehealth: Payer: Self-pay

## 2024-05-26 ENCOUNTER — Inpatient Hospital Stay

## 2024-05-26 NOTE — Telephone Encounter (Signed)
 Patient contacted the office stating that she will try to do the 7.49m gof prednisone  but if she can't get it to do any better with her body she will go back down to the 5mg . The 7.5 is too strong. Patient states if something else needed to be done to give her a call.

## 2024-05-27 ENCOUNTER — Other Ambulatory Visit: Payer: Self-pay

## 2024-05-27 ENCOUNTER — Other Ambulatory Visit (HOSPITAL_COMMUNITY): Payer: Self-pay

## 2024-05-27 ENCOUNTER — Telehealth: Payer: Self-pay | Admitting: *Deleted

## 2024-05-27 ENCOUNTER — Inpatient Hospital Stay: Attending: Hematology

## 2024-05-27 NOTE — Telephone Encounter (Signed)
 Patient contacted the office and left message stating she is taking 5 mg of Prednisone  in the morning. She states by 2:00 pm she crashes. Patient states she would like to know if she should then take the 2.5 mg of Prednisone  to equal the 7.5 mg she should be taking. Please advise.

## 2024-05-27 NOTE — Progress Notes (Signed)
 Specialty Pharmacy Initial Fill Coordination Note  Vanessa Lara is a 68 y.o. female contacted today regarding initial fill of specialty medication(s) Tocilizumab  (Actemra  ACTPen)   Patient requested Delivery   Delivery date: 05/30/24   Verified address: [address]   [name] [id number]   Medication will be filled on 05/29/24.   Patient is aware of $0 copayment.    Please send welcome packet through the mail as pt wishes not to use MyChart

## 2024-05-27 NOTE — Telephone Encounter (Signed)
 Patient called the office to advise us  to disregard the previous message, she spoke with the pharmacy an she will starting a new medication on Friday and will not be taking any more of the prednisone 

## 2024-05-28 ENCOUNTER — Other Ambulatory Visit: Payer: Self-pay

## 2024-05-28 ENCOUNTER — Inpatient Hospital Stay: Attending: Hematology | Admitting: Physician Assistant

## 2024-05-28 DIAGNOSIS — E860 Dehydration: Secondary | ICD-10-CM

## 2024-05-28 DIAGNOSIS — D649 Anemia, unspecified: Secondary | ICD-10-CM | POA: Diagnosis present

## 2024-05-28 DIAGNOSIS — D500 Iron deficiency anemia secondary to blood loss (chronic): Secondary | ICD-10-CM

## 2024-05-28 LAB — CBC WITH DIFFERENTIAL/PLATELET
Abs Immature Granulocytes: 0.03 K/uL (ref 0.00–0.07)
Basophils Absolute: 0.1 K/uL (ref 0.0–0.1)
Basophils Relative: 1 %
Eosinophils Absolute: 0.1 K/uL (ref 0.0–0.5)
Eosinophils Relative: 2 %
HCT: 38.7 % (ref 36.0–46.0)
Hemoglobin: 12.5 g/dL (ref 12.0–15.0)
Immature Granulocytes: 0 %
Lymphocytes Relative: 28 %
Lymphs Abs: 2.2 K/uL (ref 0.7–4.0)
MCH: 29.3 pg (ref 26.0–34.0)
MCHC: 32.3 g/dL (ref 30.0–36.0)
MCV: 90.8 fL (ref 80.0–100.0)
Monocytes Absolute: 0.6 K/uL (ref 0.1–1.0)
Monocytes Relative: 8 %
Neutro Abs: 5 K/uL (ref 1.7–7.7)
Neutrophils Relative %: 61 %
Platelets: 391 K/uL (ref 150–400)
RBC: 4.26 MIL/uL (ref 3.87–5.11)
RDW: 17.5 % — ABNORMAL HIGH (ref 11.5–15.5)
WBC: 8 K/uL (ref 4.0–10.5)
nRBC: 0 % (ref 0.0–0.2)

## 2024-05-28 LAB — COMPREHENSIVE METABOLIC PANEL WITH GFR
ALT: 16 U/L (ref 0–44)
AST: 24 U/L (ref 15–41)
Albumin: 3.6 g/dL (ref 3.5–5.0)
Alkaline Phosphatase: 95 U/L (ref 38–126)
Anion gap: 10 (ref 5–15)
BUN: 7 mg/dL — ABNORMAL LOW (ref 8–23)
CO2: 28 mmol/L (ref 22–32)
Calcium: 8.6 mg/dL — ABNORMAL LOW (ref 8.9–10.3)
Chloride: 100 mmol/L (ref 98–111)
Creatinine, Ser: 0.54 mg/dL (ref 0.44–1.00)
GFR, Estimated: >60 mL/min (ref >60)
Glucose, Bld: 89 mg/dL (ref 70–99)
Potassium: 3.4 mmol/L — ABNORMAL LOW (ref 3.5–5.1)
Sodium: 138 mmol/L (ref 135–145)
Total Bilirubin: 0.3 mg/dL (ref 0.0–1.2)
Total Protein: 6.4 g/dL — ABNORMAL LOW (ref 6.5–8.1)

## 2024-05-28 LAB — SAMPLE TO BLOOD BANK

## 2024-05-29 ENCOUNTER — Telehealth: Payer: Self-pay

## 2024-05-29 ENCOUNTER — Encounter: Payer: Self-pay | Admitting: Oncology

## 2024-05-29 NOTE — Progress Notes (Signed)
 error

## 2024-05-29 NOTE — Telephone Encounter (Signed)
 Patient called triage after this call. Response in that encounter.

## 2024-05-29 NOTE — Telephone Encounter (Signed)
 Patient called stating that she called on call service last night around 7 pm and by 10:15 pm they had not called her back

## 2024-05-29 NOTE — Telephone Encounter (Signed)
 Patient contacted the office to make sure she needed to be seen sooner. Advised the patient we would like to see her a month after starting the new medication. Advised the patient she has an appointment on 07/02/2024 and that is the soonest she will be able to be seen. Patient verbalized understanding and states she is feeling much better already.

## 2024-05-29 NOTE — Telephone Encounter (Signed)
 Pt is curious about getting an injection. She wants to know if it is possible for her to get one in her bursitis area. If she is unable to get an injection she wishes to know what she can do instead. Please advise.

## 2024-06-03 ENCOUNTER — Telehealth: Payer: Self-pay | Admitting: Internal Medicine

## 2024-06-03 NOTE — Telephone Encounter (Signed)
 Calling due to complain with multiple mosquito bites over her torso and upper body with severe itching. She took 5 mg prednisone  and 50 mg benadryl  and symptoms were manageable. By this morning symptoms had mostly cleared up. She reports tonight having increased symptoms starting around 5:30-6:00 PM with severe itching and return of redness around the affected areas. So far tonight she took 20 mg prednisone  and one 25 mg benadryl  as well as topical benadryl  without a lot of relief after 1-2 hours. She denies any respiratory symptoms or swelling. She is concerned symptoms are related to the recent start Actemra  since she never has such severe reaction to mosquito bites previously. I reccommended she could take additional benadryl  as she did last night and also continued supportive treatment with topical antihistamines and cold compression. She asks if getting an IM dose of steroids is possible for this since high dose oral steroid is worsening her IBS severely. I recommended she could come for a 11:20 appointment tomorrow morning to take a look at her rash and could treat her at that time if appropriate and she agrees with this plan for now.

## 2024-06-04 ENCOUNTER — Ambulatory Visit: Admitting: Internal Medicine

## 2024-06-04 ENCOUNTER — Telehealth: Payer: Self-pay | Admitting: *Deleted

## 2024-06-04 NOTE — Telephone Encounter (Signed)
 Patient contacted the office and left message at 9:35 a. Patient advised she would not be coming to her appointment because she received a letter in the mail last night that her Medicaid was dropped. Patient states she is under the impression that if her Medicaid has been dropped then her private insurance will not be active. Patient states she also decided to get the Prednisone  20 mg orally into her system as discussed with Dr. Jeannetta last night.

## 2024-06-06 ENCOUNTER — Other Ambulatory Visit: Payer: Self-pay | Admitting: Pharmacist

## 2024-06-06 ENCOUNTER — Encounter: Payer: Self-pay | Admitting: Oncology

## 2024-06-06 ENCOUNTER — Other Ambulatory Visit: Payer: Self-pay

## 2024-06-06 ENCOUNTER — Encounter: Payer: Self-pay | Admitting: Internal Medicine

## 2024-06-06 NOTE — Progress Notes (Signed)
 Next infusion for Reclast  IV (G6510) scheduled on 07/21/2024 and due for updated orders. Placed for [facility] Diagnosis: age-related osteoporosis  Dose: 5mg  IV every 12 months  Last Clinic Visit: 05/06/2024 Next Clinic Visit: 07/02/2024  Last infusion: 07/18/2023  Labs: CMP on 05/28/2024; Scr wnl  Orders placed for Reclast  IV (J3489) x 1 dose along with premedication of acetaminophen  650mg  p.o. and diphenhydramine  25mg  p.o. to be administered 30 minutes before medication infusion.  Sherry Pennant, PharmD, MPH, BCPS, CPP Clinical Pharmacist (Rheumatology and Pulmonology)

## 2024-06-10 ENCOUNTER — Telehealth: Payer: Self-pay

## 2024-06-10 ENCOUNTER — Other Ambulatory Visit: Payer: Self-pay

## 2024-06-10 DIAGNOSIS — R109 Unspecified abdominal pain: Secondary | ICD-10-CM

## 2024-06-10 MED ORDER — PANTOPRAZOLE SODIUM 40 MG PO TBEC: 40.0000 mg | DELAYED_RELEASE_TABLET | Freq: Two times a day (BID) | ORAL | 2 refills | Status: AC

## 2024-06-10 NOTE — Telephone Encounter (Signed)
 Auth Submission: NO AUTH NEEDED Site of care: Site of care: [facility] Payer: medicare a/b Medication & CPT/J Code(s) submitted: Reclast  (Zolendronic acid) J3489 Diagnosis Code:  Route of submission (phone, fax, portal): portal Phone # Fax # Auth type: Buy/Bill HB Units/visits requested: 5mg  x 1 dose Reference number:  Approval from: 06/10/24 to 10/01/24   Medicare will cover 80%, pt will be responsible for remaining 20%

## 2024-06-19 ENCOUNTER — Other Ambulatory Visit: Payer: Self-pay

## 2024-06-19 ENCOUNTER — Other Ambulatory Visit (HOSPITAL_COMMUNITY): Payer: Self-pay

## 2024-06-19 NOTE — Progress Notes (Deleted)
 Office Visit Note  Patient: Vanessa Lara             Date of Birth: [birth year]           MRN: [id number]             PCP: Suanne Pfeiffer, NP Referring: Suanne Pfeiffer, NP Visit Date: 07/02/2024   Subjective:  No chief complaint on file.   History of Present Illness: Vanessa Lara is a 68 y.o. female here for follow up with rheumatoid arthritis on HCQ 200 mg daily x5 days weekly who presents with severe pain and medication intolerance.   Previous HPI 05/06/2024 Vanessa Lara is a 68 y.o. female here for follow up with rheumatoid arthritis on HCQ 200 mg daily x5 days weekly who presents with severe pain and medication intolerance. She presents with inadequate response to current treatment and concerns about medication side effects.   She has had multiple recent injections throughout orthopedics clinic, but these only provide relief for about a month and a half. She is frustrated with the limited duration of relief and the restrictions on the frequency and dosage of these injections. She is seeking alternative treatments that do not involve narcotics.   She uses a muscle relaxer that causes significant cognitive impairment, limiting her ability to function during the day. To manage this, she takes small doses throughout the day, but this is not ideal. She is concerned about adding more medication due to her long-standing irritable bowel syndrome (IBS), which has been present for 53 years and has left her gastrointestinal system compromised.   She is interested in trying a different rheumatoid arthritis medication due to previous drugs either causing side effects or being ineffective. She recalls a past x-ray showing bone erosion and wants to prevent progression. Her last sedimentation rate was 58 in April.   She describes significant functional limitations due to her condition, particularly in her hands, which she describes as having difficulty with manipulation. She also experiences  issues with her shoulders and back, where she received an injection two weeks ago that has since worn off. She notes muscle atrophy and difficulty maintaining foot position, which she attributes to overall muscle weakness.   Her diet is limited due to IBS, and she struggles to maintain adequate calorie intake. She is sevrely underweight and worsening, down to 62 lbs and BMI of 12. She eats red meat, fish, chicken, green vegetables, and bread but avoids high-fat foods due to gastrointestinal distress.   She recalls a past incident of heart failure induced by medication, which caused significant water  weight gain without typical symptoms like swelling. She is cautious about medication side effects, particularly those affecting her heart.         Previous HPI 01/07/2024 Vanessa Lara is a 68 year old female with rheumatoid arthritis on HCQ 200 mg daily x5 days weekly who presents with severe pain and medication intolerance.   She experiences severe pain due to arthritis, exacerbated by muscle spasms in her back. The pain is intense and significantly impacts her quality of life, leaving her feeling 'miserable' and without a 'happy place'. Flare-ups occur every other day or every third day, preventing her from performing daily activities.   She has a history of intolerance to several medications. Azathioprine  caused severe gastrointestinal issues, and she is currently on hydroxychloroquine  for arthritis. She cannot take codeine  due to gastrointestinal side effects related to her IBS. Muscle relaxants like Flexeril  have been ineffective in managing  her symptoms.   A recent bone marrow study was performed without anesthesia due to low blood pressure, which she found distressing. She experiences dizziness throughout the day, which she suspects may be related to anemia or other underlying issues. She has gained a small amount of weight recently, about four ounces.   She has been unable to follow up with an  orthopedist due to her condition flaring up, making it difficult for her to travel. She also struggles with arranging transportation as her plans often fall through at the last minute.      Previous HPI 11/19/23 Vanessa Lara is a 68 year old female with rheumatoid arthritis on hydroxychloroquine  200 mg once daily for 5 days per week.   She experiences significant pain and stiffness, particularly on Sundays, leading her to take an additional half dose of her medication on Sundays for the past two weekends. She describes waking up with severe stiffness and being unable to move by Monday if she does not take the extra dose. She is seeking an alternative medication to manage her symptoms more effectively.   She describes pain associated with bursitis and possible rotator cuff issues, noting a pressure point that radiates from her shoulder down towards her armpit and into her upper arm, forming a 'V' shape. She is scheduled to receive an injection for this pain.   She reports issues with her foot, including a previously twisted or sprained ankle that healed but then became painful and red, particularly around a bone that feels bruised. She also mentions a rod in her big toe that has shifted, causing pain and misalignment of her toes. She was previously told that surgery could correct her toes, but now her arthritis and varicose veins have worsened, raising concerns about circulation, infection, and potential amputation. Her bone density is very low, complicating surgical options.   She mentions her knuckles are sore, with one being particularly bad, and she experiences pain that radiates down her ear. She also notes fluctuations in her weight, questioning whether it is due to scale differences or her condition.      Previous HPI 07/04/23 Vanessa Lara is a 68 y.o. female here for follow up for seropositive RA and osteoarthritis and osteoporosis she stopped hydroxychloroquine  after last visit due to  concern about cardiac arrhythmia alongside Paxil  and duloxetine .  She was only able to take a low-dose of the medication she did feel it improved her overall condition but still had persistent arthritis of numerous areas regardless.  She had an updated bone density scan on July 15 still with osteoporosis of the lumbar spine although better than 2022 and with femur osteopenia similar to the previous test.  She is taking Venofer  for iron  deficiency anemia this ongoing to correct it with her goal of enough to tolerate foot surgery.  She underwent endoscopy with no upper or lower GI findings to account for heme positive stool. Acute problem today increased right ankle pain and swelling started since 3 weeks ago.  She did not recall any fall trip or other trauma to that area.  Initially there was a high degree of swelling he put a wrap around this and can see prominent indentation the swelling has largely decreased compared to the outset but still has pain with direct pressure and with weightbearing.   Imaging reviewed DEXA Review [facility] Spine L1-L2 04/16/23  -3.8    0.712 g/cm2 04/12/21  -4.5    0.627 g/cm2 04/13/20  -4.2  0.665 g/cm2   DualFemur Total Mean 04/16/23  -2.6    0.680 g/cm2 04/12/21  -2.6    0.676 g/cm2 04/13/20  -2.2    0.736 g/cm2     03/08/23 Vanessa Lara is a 68 y.o. female here for follow up for seropositive RA.  Currently off any disease specific medication she cannot tolerate taking the sulfasalazine  due to severe stomach irritation described as burning up her stomach.  Continues having joint pain and stiffness on a daily basis overall feels her function is very poor due to the severity.  Had labs last month still anemic at hemoglobin of 9.8 being treated with infusion for iron  deficiency anemia.   01/16/23 Vanessa Lara is a 68 y.o. female here for follow up for seropositive RA.  Currently health maintenance treatment after discontinuing 5 mg prednisone  is on medication for joint pain  for RA along with osteoarthritis and chronic pain syndrome.  Had recent knee steroid injection since her last visit but reports overall widespread symptoms are severe.  Particularly is been noticing some increased trouble with pain and some swelling involving the wrist worse on left side.   10/03/22 Vanessa Lara is a 68 y.o. female here for follow up for seropositive RA currently off long term DMARD with prednisone  5 mg daily as needed.  She is overall not doing well feels like pain and stiffness is a big problem but more severe is her fatigue and feels a generalized getting weaker with difficulty moving and with her upper body strength.  She pretty consistently has to take the prednisone  for otherwise does not move around much during the day.  Continues to get a benefit with the Flexeril  at nighttime with slight decrease in frequency of awakenings.  She is also concerned about getting an update to her bone density testing ongoing oral alendronate  treatment.  She is also still having a lot of pain and difficulty with walking from her claw toe deformities on the foot and planning to follow-up with surgery clinic about management of this.   06/28/22 Vanessa Lara is a 68 y.o. female here for follow up for seropositive RA now off of treatment after stopping the Kevzara .  Since she stopped taking the medication her complaint of night sweats and fatigue have improved. She has seen a worsening of joint pain in several areas particularly around the MCP joints of both hands. For knee pain she saw orthopedic surgery clinic yesterday and had injections on both sides.  She also discussed getting set up to do right SI joint steroid injection.  Biggest complaint today is back pain on the right side around the middle of the back and around the scapula.  This bothers her especially with certain movements and trying to bend side to side.    05/18/2022  Vanessa Lara is a 68 y.o. female here for follow up for seropositive  rheumatoid arthritis on Kevzara  150 mg [place] q14days. She feels arthritis has been fairly well controlled but has pain worse in her left foot mostly. She had GI issues suspected UTI but with negative culture since last visit. She feels a sensation like her brain is numb. Also has ongoing upper neck pain without radiating symptoms.    02/07/2022 Vanessa Lara is a 68 y.o. female here for follow up for rheumatoid arthritis with chronic joint pain in multiple areas and chronic fatigue labs showing elevated rheumatoid factor. Kevzara  new start on 12/15/2021. She felt an improvement when taking the  injections within first weeks. Accidentally took the shots twice in a week due to forgetfulness or mistake but did not notice any major problems. She called in on account of increased joint pain but felt no improvement with the prescribed medrol  dose pack. She did feel more lightheadedness or off balance feeling while taking this so stopped after about 3 days. Currently back pain and her left foot pain are worst issues.   Previous HPI 12/05/2021 Vanessa Lara is a 68 y.o. female here for evaluation of rheumatoid arthritis with chronic joint pain in multiple areas and chronic fatigue labs showing elevated rheumatoid factor. She has history of mitral valve prolapse, severe osteoporosis, osteoarthritis, and severe weight loss and deconditioning. She was diagnosed with seropositive RA in 2018 with Dr. Curt and most recently saw Dr. Mai in 11/2020. At that time recommended to start hydroxychloroquine  for inflammatory arthritis symptoms. She previously tried methotrexate, simponi  aria, and orencia  with poor tolerance. Imaging consistent with degenerative arthritis in cervical spine and knee but also with some partial tendon tear in shoulder and some synovitis present in knee on MRI imaging. She has taken multiple medications for pain including NSAIDs, prednisone , gabapentin , tramadol , and hydrocodone  or oxycodone . She has a  lot of pain in multiple areas but is also severely fatigued. She has significant weight loss down to 70 lbs unintentionally reports chronic IBS issues but no recent changes during this period of weight loss. Joint pain also limits her activity quite a bit with chronic deformities and also ongoing swelling in bilateral hands. She has also developed left sided headache localized above the temporal area. She has a lot of pain at the base of the skull and occiput without clear radiation of symptoms. She is scheduled to see Dr. Rush for headache evaluation and management 3/28.   DMARD Hx AZA LEF HCQ SSZ MTX Simponi  Orencia  Kevzara    05/2017 RF 15 CCP 32 HBV neg HCV neg     No Rheumatology ROS completed.   PMFS History:  Patient Active Problem List   Diagnosis Date Noted   Hyponatremia 05/07/2024   Temporal arteritis syndrome (HCC) 05/07/2024    Class: Acute   Hypotension 05/07/2024   Chronic gastritis without bleeding 08/01/2023   Generalized anxiety disorder 08/01/2023   Redundant colon 08/01/2023   Mixed hyperlipidemia 08/01/2023   History of CVA (cerebrovascular accident) 11/14/2022   Insomnia due to medical condition 03/15/2022   Degeneration of intervertebral disc of cervical region 12/28/2021   Physical deconditioning 11/11/2021   Elevated serum lactate dehydrogenase (LDH) 08/17/2021   Vitamin D  deficiency 08/17/2021   Vitamin B12 deficiency 07/19/2021   Hardening of the aorta (main artery of the heart) (HCC) 06/21/2021   Solitary lung nodule 06/15/2021   Protein-calorie malnutrition, severe (HCC) 05/18/2021   Osteoporosis 06/15/2020   Iron  deficiency anemia 04/09/2020   Primary osteoarthritis involving multiple joints 12/31/2018   Rheumatoid arthritis involving multiple sites with positive rheumatoid factor (HCC) 12/31/2018   Irritable bowel syndrome with both constipation and diarrhea 10/28/2018   Mitral valve prolapse 11/23/2015    Past Medical History:   Diagnosis Date   Anemia    Anxiety    Arthritis    RA   Bursitis    Cataract    CHF (congestive heart failure) (HCC)    Collagen vascular disease (HCC)    Depression    Endometriosis    Heart murmur    High cholesterol    Hypertension    IBS (irritable bowel syndrome)  Lymphadenopathy    Mitral valve prolapse    Osteoporosis    RA (rheumatoid arthritis) (HCC)    Rotator cuff tear    bilateral   Stroke [facility]) 06/2022   Uterus, adenomyosis     Family History  Problem Relation Age of Onset   Bone cancer Mother    Brain cancer Father    Brain cancer Sister    Breast cancer Sister    Renal Disease Sister    Pancreatic cancer Neg Hx    Esophageal cancer Neg Hx    Stomach cancer Neg Hx    Rectal cancer Neg Hx    Colon polyps Neg Hx    Past Surgical History:  Procedure Laterality Date   ARTERY BIOPSY Left 05/08/2024   Procedure: LEFT TEMPORAL ARTERY BIOPSY;  Surgeon: Pearline Norman RAMAN, MD;  Location: [facility] OR;  Service: Vascular;  Laterality: Left;   AXILLARY LYMPH NODE BIOPSY Right 10/12/2020   Procedure: RIGHT AXILLARY LYMPH NODE BIOPSY EXCISION;  Surgeon: Aron Shoulders, MD;  Location: [facility];  Service: General;  Laterality: Right;   BUNIONECTOMY Right    CARPAL TUNNEL RELEASE Right    COLONOSCOPY     COLONOSCOPY N/A 12/13/2015   Procedure: COLONOSCOPY;  Surgeon: Claudis RAYMOND Rivet, MD;  Location: [facility];  Service: Endoscopy;  Laterality: N/A;  9:55   Fatty tumor     2011 (left arm)   TONSILLECTOMY     TOTAL ABDOMINAL HYSTERECTOMY     2003   Social History   Social History Narrative   Not on file   Immunization History  Administered Date(s) Administered   Influenza, Quadrivalent, Recombinant, Inj, Pf 07/11/2017     Objective: Vital Signs: There were no vitals taken for this visit.   Physical Exam   Musculoskeletal Exam: ***  CDAI Exam: CDAI Score: -- Patient Global: --; Provider Global: -- Swollen: --; Tender: -- Joint Exam  07/02/2024   No joint exam has been documented for this visit   There is currently no information documented on the homunculus. Go to the Rheumatology activity and complete the homunculus joint exam.  Investigation: No additional findings.  Imaging: No results found.  Recent Labs: Lab Results  Component Value Date   WBC 8.0 05/28/2024   HGB 12.5 05/28/2024   PLT 391 05/28/2024   NA 138 05/28/2024   K 3.4 (L) 05/28/2024   CL 100 05/28/2024   CO2 28 05/28/2024   GLUCOSE 89 05/28/2024   BUN 7 (L) 05/28/2024   CREATININE 0.54 05/28/2024   BILITOT 0.3 05/28/2024   ALKPHOS 95 05/28/2024   AST 24 05/28/2024   ALT 16 05/28/2024   PROT 6.4 (L) 05/28/2024   ALBUMIN 3.6 05/28/2024   CALCIUM  8.6 (L) 05/28/2024   GFRAA 92 11/06/2019   QFTBGOLDPLUS NEGATIVE 05/06/2024    Speciality Comments: Patient states they can't get one till next year   Procedures:  No procedures performed Allergies: Abatacept , Codeine , Golimumab , Hydrocodone , Methylprednisolone , Methotrexate derivatives, and Prednisone    Assessment / Plan:     Visit Diagnoses: No diagnosis found.  ***  Orders: No orders of the defined types were placed in this encounter.  No orders of the defined types were placed in this encounter.    Follow-Up Instructions: No follow-ups on file.   Kahley Leib M Feleica Fulmore, CMA  Note - This record has been created using Animal nutritionist.  Chart creation errors have been sought, but may not always  have been located. Such creation errors do not  reflect on  the standard of medical care.

## 2024-06-19 NOTE — Progress Notes (Signed)
 Benefits Investigation Started  Rejection: ELIGIBILITY ON FILE:10-02-22 TO 06-01-24. No insurance found through eligibility.  Routed to: [facility]

## 2024-06-20 ENCOUNTER — Other Ambulatory Visit (HOSPITAL_COMMUNITY): Payer: Self-pay

## 2024-06-20 ENCOUNTER — Other Ambulatory Visit: Payer: Self-pay

## 2024-06-23 ENCOUNTER — Telehealth: Payer: Self-pay

## 2024-06-23 ENCOUNTER — Telehealth: Payer: Self-pay | Admitting: *Deleted

## 2024-06-23 ENCOUNTER — Other Ambulatory Visit (HOSPITAL_COMMUNITY): Payer: Self-pay

## 2024-06-23 ENCOUNTER — Inpatient Hospital Stay

## 2024-06-23 ENCOUNTER — Encounter: Payer: Self-pay | Admitting: *Deleted

## 2024-06-23 NOTE — Telephone Encounter (Signed)
 FYI  - Patient has severe fatigue, patient stated she had the flu last week and a stomach virus the week before, patient advised to call PCP.

## 2024-06-23 NOTE — Telephone Encounter (Signed)
 Patient contacted the office and states she is having difficulty with her new medication, Actemra . Patient states she is becoming very weak. Patient states she has to push a green button in to get the medication into herself. Patient states she is having a hard time pressing that green button. Patient states she is a couple weeks behind because she could not press the button. Patient states she just got the medication this week because she was finally able to press the button in. Patient inquires if there is another way for her to get this medication. Patient also inquires how long it takes the medication to work. Please advise.

## 2024-06-24 ENCOUNTER — Inpatient Hospital Stay

## 2024-06-24 ENCOUNTER — Other Ambulatory Visit (HOSPITAL_COMMUNITY): Payer: Self-pay

## 2024-06-24 ENCOUNTER — Other Ambulatory Visit: Payer: Self-pay

## 2024-06-24 ENCOUNTER — Telehealth: Payer: Self-pay

## 2024-06-24 NOTE — Telephone Encounter (Signed)
 Vanessa Lara, CPhT to Rx Rheum/Pulm (Selected Message)  06/19/24 12:03 PM Hello Team (ATTNBETHA Dunk).    Rejection: ELIGIBILITY ON FILE:10-02-22 TO 06-01-24. No insurance found through eligibility.   Can you look into this? Thank you  -----------------------------------------------------------------------------------------------  Per eligibility check pt shows that she DOES have Medicare coverage, however it is not currently active. Called and spoke with pt and she states that she is aware of the issue and just needs to go out and sign a paper in order to fully activate her new plan. She informs me that the plan information should be the same as her current plan.   She also informs me that she would be interested in looking in to receiving her injections from the infusion center due to the difficulty she has been having even with the autoinjector. Advised that we could potentially look into switching her over once her new insurance has become active. She verbalized understanding and stated that she would work on it as soon as she could.

## 2024-06-24 NOTE — Telephone Encounter (Signed)
 Pt states that she would be interested in receiving injections through the infusion center, however her Medicare plan is currently inactive so these benefits cannot yet be investigated. She will reach out to us  once her new plan has become active.

## 2024-06-24 NOTE — Progress Notes (Signed)
 Pt's Medicare is currently inactive, however she is currently interested in switching to in-office injections in the future. Okay to put refills on hold while we get her coverage situation sorted out.

## 2024-06-25 ENCOUNTER — Telehealth: Payer: Self-pay | Admitting: *Deleted

## 2024-06-25 NOTE — Telephone Encounter (Signed)
 Patient contacted the office and states she has found out what is wrong with her insurance. Patient states she no longer has her Medicaid except to cover her premiums. Patient states we should take the Medicaid out and only run the [facility]

## 2024-06-26 ENCOUNTER — Telehealth: Payer: Self-pay

## 2024-06-26 ENCOUNTER — Other Ambulatory Visit (HOSPITAL_COMMUNITY): Payer: Self-pay

## 2024-06-26 NOTE — Telephone Encounter (Signed)
 Patient called the office stating she is In lots of pain and wanting to know if she can take her Actemra  a couple days early. She took her last dose on Sunday. She states she is absolutely miserable and very fatigue. If she can't take th actemra  early she would like to know if she can come in and get a injection for the pain.

## 2024-06-26 NOTE — Telephone Encounter (Signed)
 Cena Alfonso CROME, LPN 0/75/74 87:70 PM Note Patient contacted the office and states she has found out what is wrong with her insurance. Patient states she no longer has her Medicaid except to cover her premiums. Patient states we should take the Medicaid out and only run the [facility].     ------------------------------------------------------------------------------------------------------  Still no results found on eligibility check. Contacted pt who states that is currently waiting on her case manager to rewrite a new policy for her, however she states that she was not expecting it to take this long. I advised that I would continue to periodically check her eligibility and that we would get the medication ready to ship as soon as her coverage is reinstated.   She had asked Francina to pass along a message to Dr. Jeannetta regarding whether she could take her next injection a few days early, and wanted know my opinion on the matter. I responded by stating that I was certainly not an expert on the matter and would need to defer to Dr. Joyce opinion, however I recommended against attempting to inject prior to the 7-day mark. She inquired as to why the manufacturer decided that it had to be every 7 days and I explained that they most likely did testing prior to the medication becoming readily available to the public and determined that a dosing schedule of every 7 days was the most effective balance between ensuring there is enough medication in the system to achieve benefit while also not having too much at any given time to cause an overdose. She verbalized understanding to all.

## 2024-06-26 NOTE — Telephone Encounter (Signed)
 Patient would like a call back from pharmacy team to update her insurance information.

## 2024-06-30 ENCOUNTER — Inpatient Hospital Stay: Admitting: Physician Assistant

## 2024-06-30 ENCOUNTER — Other Ambulatory Visit

## 2024-06-30 LAB — OPHTHALMOLOGY REPORT-SCANNED

## 2024-06-30 NOTE — Telephone Encounter (Signed)
 Patient called requesting that [facility] please give her a return call in the morning. Thanks!

## 2024-06-30 NOTE — Telephone Encounter (Signed)
 Will place Actemra  IV referral once insurance is active  Aleeha Boline, PharmD, MPH, BCPS, CPP Clinical Pharmacist [facility])

## 2024-07-01 ENCOUNTER — Inpatient Hospital Stay: Payer: Self-pay

## 2024-07-01 ENCOUNTER — Telehealth: Payer: Self-pay | Admitting: *Deleted

## 2024-07-01 NOTE — Telephone Encounter (Signed)
 Patient contacted the office and provided her new [employer] member ID number.  [employer] Member ID: [id number]

## 2024-07-02 ENCOUNTER — Ambulatory Visit: Admitting: Internal Medicine

## 2024-07-02 ENCOUNTER — Encounter: Payer: Self-pay | Admitting: Oncology

## 2024-07-02 ENCOUNTER — Inpatient Hospital Stay: Attending: Hematology

## 2024-07-02 ENCOUNTER — Encounter: Payer: Self-pay | Admitting: Internal Medicine

## 2024-07-02 ENCOUNTER — Telehealth: Payer: Self-pay | Admitting: *Deleted

## 2024-07-02 ENCOUNTER — Telehealth: Payer: Self-pay

## 2024-07-02 ENCOUNTER — Other Ambulatory Visit (HOSPITAL_COMMUNITY): Payer: Self-pay

## 2024-07-02 DIAGNOSIS — K582 Mixed irritable bowel syndrome: Secondary | ICD-10-CM

## 2024-07-02 DIAGNOSIS — D500 Iron deficiency anemia secondary to blood loss (chronic): Secondary | ICD-10-CM

## 2024-07-02 DIAGNOSIS — Z79899 Other long term (current) drug therapy: Secondary | ICD-10-CM

## 2024-07-02 DIAGNOSIS — D649 Anemia, unspecified: Secondary | ICD-10-CM | POA: Diagnosis present

## 2024-07-02 DIAGNOSIS — M810 Age-related osteoporosis without current pathological fracture: Secondary | ICD-10-CM | POA: Insufficient documentation

## 2024-07-02 DIAGNOSIS — M0579 Rheumatoid arthritis with rheumatoid factor of multiple sites without organ or systems involvement: Secondary | ICD-10-CM

## 2024-07-02 DIAGNOSIS — R5381 Other malaise: Secondary | ICD-10-CM

## 2024-07-02 DIAGNOSIS — R21 Rash and other nonspecific skin eruption: Secondary | ICD-10-CM

## 2024-07-02 LAB — CBC WITH DIFFERENTIAL/PLATELET
Abs Immature Granulocytes: 0.03 K/uL (ref 0.00–0.07)
Basophils Absolute: 0.1 K/uL (ref 0.0–0.1)
Basophils Relative: 1 %
Eosinophils Absolute: 0.1 K/uL (ref 0.0–0.5)
Eosinophils Relative: 2 %
HCT: 32.2 % — ABNORMAL LOW (ref 36.0–46.0)
Hemoglobin: 10.3 g/dL — ABNORMAL LOW (ref 12.0–15.0)
Immature Granulocytes: 0 %
Lymphocytes Relative: 26 %
Lymphs Abs: 1.8 K/uL (ref 0.7–4.0)
MCH: 30.4 pg (ref 26.0–34.0)
MCHC: 32 g/dL (ref 30.0–36.0)
MCV: 95 fL (ref 80.0–100.0)
Monocytes Absolute: 0.6 K/uL (ref 0.1–1.0)
Monocytes Relative: 9 %
Neutro Abs: 4.3 K/uL (ref 1.7–7.7)
Neutrophils Relative %: 62 %
Platelets: 429 K/uL — ABNORMAL HIGH (ref 150–400)
RBC: 3.39 MIL/uL — ABNORMAL LOW (ref 3.87–5.11)
RDW: 17 % — ABNORMAL HIGH (ref 11.5–15.5)
WBC: 6.9 K/uL (ref 4.0–10.5)
nRBC: 0 % (ref 0.0–0.2)

## 2024-07-02 LAB — FERRITIN: Ferritin: 78 ng/mL (ref 11–307)

## 2024-07-02 LAB — COMPREHENSIVE METABOLIC PANEL WITH GFR
ALT: 13 U/L (ref 0–44)
AST: 28 U/L (ref 15–41)
Albumin: 4 g/dL (ref 3.5–5.0)
Alkaline Phosphatase: 75 U/L (ref 38–126)
Anion gap: 12 (ref 5–15)
BUN: 9 mg/dL (ref 8–23)
CO2: 23 mmol/L (ref 22–32)
Calcium: 8.8 mg/dL — ABNORMAL LOW (ref 8.9–10.3)
Chloride: 102 mmol/L (ref 98–111)
Creatinine, Ser: 0.61 mg/dL (ref 0.44–1.00)
GFR, Estimated: >60 mL/min (ref >60)
Glucose, Bld: 92 mg/dL (ref 70–99)
Potassium: 3.3 mmol/L — ABNORMAL LOW (ref 3.5–5.1)
Sodium: 137 mmol/L (ref 135–145)
Total Bilirubin: <0.2 mg/dL (ref 0.0–1.2)
Total Protein: 6 g/dL — ABNORMAL LOW (ref 6.5–8.1)

## 2024-07-02 LAB — IRON AND TIBC
Iron: 44 ug/dL (ref 28–170)
Saturation Ratios: 15 % (ref 10.4–31.8)
TIBC: 298 ug/dL (ref 250–450)
UIBC: 254 ug/dL

## 2024-07-02 LAB — SAMPLE TO BLOOD BANK

## 2024-07-02 NOTE — Telephone Encounter (Signed)
 Patient's VM box is full. She has been advised to clear this out so we can communicate with her

## 2024-07-02 NOTE — Telephone Encounter (Signed)
 Addressed on 07/01/2024 phone encounter

## 2024-07-02 NOTE — Telephone Encounter (Signed)
 Vanessa Lara CROME, LPN  0/69/74  5:94 PM Note Patient contacted the office and provided her new [employer] member ID number.   [employer] Member ID: [id number]    ----------------------------------------------------------------------------------------------------------------------------  Eligibility check in [facility] still shows that pt's insurance is not yet active.   Attempted to contact pt to discuss, however her VM box is full and I could not leave a message. Will reattempt to contact pt later today.

## 2024-07-02 NOTE — Progress Notes (Unsigned)
 VIRTUAL VISIT via TELEPHONE NOTE [facility]   I connected with Vanessa Lara  on 07/03/24 at  11:00 AM by telephone and verified that I am speaking with the correct person using two identifiers.  Location: Patient: Home Provider: [facility]   I discussed the limitations, risks, security and privacy concerns of performing an evaluation and management service by telephone and the availability of in person appointments. I also discussed with the patient that there may be a patient responsible charge related to this service. The patient expressed understanding and agreed to proceed.  REASON FOR VISIT:  Follow-up for normocytic anemia   CURRENT THERAPY: Intermittent IV iron  (last dose of Venofer  200 mg on 01/14/2024)  INTERVAL HISTORY:   Vanessa Lara 68 y.o. female returns for routine follow-up of normocytic anemia.   She was last evaluated via telemedicine visit by Vanessa Barefoot PA-C on 03/25/2024.  In the interim since her last visit, she was hospitalized from 05/07/2024 to 05/08/2024 for hyponatremia and headaches related to temporal arteritis.    At today's visit, she reports feeling poorly due to severe fatigue and worsening joint pain from rheumatoid arthritis.  She continues to suffer from chronic pain, malnutrition, weight loss, severe fatigue, and general malaise.  She is seeing multiple specialists for workup and management of these chronic issues.  Since last visit, she received IV Feraheme  on 04/03/2024 and 05/12/2024, which she felt that she tolerated better than the Venofer  in the past.    She denies having felt any improvement in energy after her most recent iron , although it's hard to tell if she's better or not since she is so inflamed form the RA.    She also reports severe cold intolerance, dyspnea on exertion, and palpitations.  Occasional dizziness and headaches.  No chest pain.   She reports generalized weakness and deconditioning, with  increased heart rate and tachypnea reported with exertion. She continues to deny any obvious rectal bleeding, melena, epistaxis, or other source of bleeding.  She requires intermittent steroid injections via orthopedist due to her arthritis.   No B symptoms, masses, or lymphadenopathy.  She has little to no energy and 70% appetite.   She continues to be severely underweight and cachectic.  ASSESSMENT & PLAN:  1.  Normocytic anemia - (anemia of chronic disease +/- iron  deficiency) - History of significant iron  deficiency with mild anemia (Hgb ranging from 11.0-12.0) since at least 2021. - Moderate to severe anemia (Hgb <10.0) since October 2024 (NOTE: Started on Plaquenil  in June 2024 for rheumatoid arthritis) - PRBC transfusion x 1 on 10/30/2023 due to Hgb 7.8, symptomatic - She was Hemoccult stool POSITIVE x 3. - Most recent EGD (06/18/2023): Normal esophagus.  Diffuse mildly congestive gastric mucosa.  Normal mucosa in duodenum.  Biopsies taken for evaluation of celiac disease were NEGATIVE. - Most recent colonoscopy (06/18/2023): Decreased sphincter tone.  Otherwise normal colon. - Capsule endoscopy (02/08/2021): Complete capsule endoscopy with no findings to explain patient's symptoms - CT enterography abdomen/pelvis (01/04/2021): No abnormal findings to explain chronic iron  deficiency   - Anemia workup summarized below:  Negative for hemolysis (normal haptoglobin, bilirubin, LDH, DAT) Nutritional panel: Iron  deficiency/functional iron  deficiency, but otherwise normal B12, MMA, folate, copper . Inflammatory markers elevated. Kidney function is normal to date SPEP, immunofixation, light chains negative for monoclonal gammopathy, but demonstrate polyclonal gammopathy that is consistent with her chronic autoimmune disease.  - Bone marrow biopsy (12/10/2023): Normocellular bone marrow with trilineage hematopoiesis.  Occasional  megakaryocytes with abnormal lobation are noted, but do not reach  threshold of 10% for dysplasia.  No increased blasts.  Secondary causes of patient's anemia should be considered.  Cytogenetics with normal female karyotype. - REPORTED SYMPTOMS AFTER IV IRON  She reports that her IBS seems to flareup following her Venofer  infusions, with worsening diarrhea and constipation that alternate for 5 to 7 days following each iron  infusion   - Most recent IV iron  with Feraheme  x 2 on 04/03/2024 and 05/12/2024 , which she tolerated better than Venofer  - No bright red blood per rectum or melena. - She has severe chronic fatigue, which is multifactorial - Most recent labs (07/02/2024): Hgb 10.3/MCV 95.0 Ferritin 78, iron  saturation 15%, normal TIBC 298 Creatinine 0.61 with normal GFR. - DIFFERENTIAL DIAGNOSIS favors anemia of chronic disease with functional iron  deficiency, occult GI blood loss, and likely malabsorption secondary to chronic inflammation.  She may also have some drug-induced myelosuppression related to Plaquenil  (note that Plaquenil  effects on bone marrow are not always directly observed on bone marrow biopsy). - PLAN: We will schedule her for IV Feraheme  510 mg x 2 doses, to be given at the [facility] 1 month apart.  (Pending prior authorization from insurance) - Continue CBC/BB sample monthly   - Discussion with other specialists: Gastroenterology: Patient is in the process of establishing care with new gastroenterologist (Dr. Malachy in [place]).  Once established, we will discuss with Dr. Malachy regarding possible ongoing blood loss if she has any further severe drops in hemoglobin. - RTC 4 months for labs and OFFICE visit  - Patient continues to demand blood transfusions to help her feel better, and was again reminded that she does not meet criteria for blood transfusions and that receiving blood transfusions would not solve her underlying medical issues that are causing her to feel poorly.  2.  Leukocytosis & thrombocytosis - Past CBCs reveal  intermittent leukocytosis (neutrophil predominant) and thrombocytosis since at least 2020 - MPN work-up was negative for JAK2 V617F, JAK2 exon 12, MPL and CALR.  Reflex testing to NGS did not reveal any abnormal mutations.  (2021) - BCR/ABL FISH was negative (2021) - Inflammatory markers have been elevated in the setting of rheumatoid arthritis  - Bone marrow biopsy (12/10/2023): Normocellular bone marrow with trilineage hematopoiesis.  Occasional megakaryocytes with abnormal lobation are noted, but do not reach threshold of 10% for dysplasia.  No increased blasts.  Secondary causes of patient's anemia should be considered.  Cytogenetics with normal female karyotype. - She receives frequent steroid injections for degenerative joint disease, most recently on 12/14/2023.  She is also taking prednisone  5 mg daily since December 2024. - Findings are consistent with chronic inflammation, chronic steroid use, and reactive leukocytosis/thrombocytosis - Most recent CBC (07/02/2024) with normal WBC/differential, platelets mildly elevated 429 - PLAN: Most likely represents reactive leukocytosis/thrombocytosis.  We will continue active surveillance.  3.  Adult failure to thrive - Patient has had intermittent episodes of weight loss, malnutrition, and decreased functional status ongoing for the past several years and likely related to her severe RA and GI issues - She follows with her PCP, GI, rheumatology, and endocrinology for severe IBS and weight loss. - She is up-to-date on routine cancer screening such as colonoscopy and mammogram. - She has had extensive previous imaging including PET scan in October 2021, all of which has been negative for malignancy - Due to strong family history of malignancy, she was previously recommended for [facility] testing, but this was not  covered by insurance and was cost prohibitive. - PLAN: Weight loss and malnutrition is being managed by her PCP.   4.  PET positive right  axillary lymph node - PET scan obtained 07/09/2020 due to unintentional weight loss showed mildly hypermetabolic right axillary lymph node - Right axillary lymph node was palpable on exam (November 2021) - She has completed 5 days of azithromycin  as she had a history of cat bite. - She was advised by Dr. Federico that her lymph node was likely reactive and related to her underlying inflammatory disease, but patient insisted on biopsy. - Patient opted for right axillary lymph node biopsy (10/12/2020), which showed benign reactive lymph node, no malignancy   5.  PET positive ileocecal lesion - PET scan obtained 07/09/2020 for unintentional weight loss showed accentuated activity along the distalmost terminal ileum and ileocecal valve, possibly physiologic since there was no mass or specific abnormality in this region on diagnostic CT as of 06/23/2020. - Patient had colonoscopy on 08/12/2020, in which the cecum and IC valvle appeared normal. Terminal ileum could not be intubated due to restricted sigmoid and redundant colon. - CT abdomen/pelvis with contrast (01/04/2021): No abnormal bowel wall thickening or abnormal mural enhancement in the stomach, small bowel, or colon.  No findings to suggest inflammatory or chronic fibrous small bowel stricture.  No mesenteric or intraperitoneal free fluid.  Terminal ileum was normal, no edema or inflammation in the region of the cecum.   6.  Osteoporosis - DEXA scan on 04/13/2020 shows T score -4.2. - She follows with endocrinology and orthopedics   7.  Rheumatoid arthritis - She follows with Dr. Jeannetta. - Previously followed with Dr. Mai / Rosaline Salt of [facility]  8.  Other issues - Patient expressed some emotional distress and difficulty coping with chronic illness and chronic pain. - She is established with palliative care services. - We discussed that she may benefit from adding a licensed counselor/therapist to her care team, but she feels  overwhelmed with the idea of having another place to go to.  She was very excited to learn that online therapy may be an option. - PLAN: Patient provided with resources for coping with chronic illness, including information regarding various online counseling/therapy services.   PLAN SUMMARY: >> IV Feraheme  510 mg once a month x 2 (at outpatient [facility]fusion [facility])  >> CBC/BB sample every month (okay to do at Infusion Center or [facility] >> Labs in 4 months = CBC/D, ferritin, iron /TIBC, BB sample >> OFFICE visit in 4 months (1 week after labs)     REVIEW OF SYSTEMS:   Review of Systems  Constitutional:  Positive for malaise/fatigue. Negative for chills, diaphoresis, fever and weight loss.  Respiratory:  Negative for cough and shortness of breath.   Cardiovascular:  Negative for chest pain and palpitations.  Gastrointestinal:  Positive for constipation, diarrhea and nausea. Negative for abdominal pain, blood in stool, melena and vomiting.  Musculoskeletal:  Positive for back pain, joint pain and neck pain.  Neurological:  Positive for tingling and headaches. Negative for dizziness.  Psychiatric/Behavioral:  Positive for depression. The patient is nervous/anxious.     PHYSICAL EXAM: (per limitations of virtual telephone visit)  The patient is alert and oriented x 3, exhibiting adequate mentation, good mood, and ability to speak in full sentences and execute sound judgement.  WRAP UP:   I discussed the assessment and treatment plan with the patient. The patient was provided an opportunity to ask questions and all  were answered. The patient agreed with the plan and demonstrated an understanding of the instructions.   The patient was advised to call back or seek an in-person evaluation if the symptoms worsen or if the condition fails to improve as anticipated.  I provided 35 minutes of non-face-to-face time during this encounter, including > 10 minutes of medical discussion.  Vanessa CHRISTELLA Barefoot, PA-C 07/03/24 11:38 AM

## 2024-07-02 NOTE — Telephone Encounter (Signed)
 Returned call to patient. She read back to me a a PCN/Group and ID that we already had. Advised patient that we are unable to run  She has been advised to continue Actemra  subcut injections until infusions are configured.  Spent an hour on conference call with [employer] licensed sales rep and patient. Patient is no longer eligible for Dual Medicare/Medicaid. Medicaid will only help cover Part B premium  Her eligibility is [employer] - non $0 cost share. She will have the following plan (special health plan that works with Medicare and Medicaid - medical, dental, vision, and drug benefits): [phone] Patient enrolled into [facility] plan that will start on 08/02/2024 PCP is $0; specialist will be $0-$25 max; scans and exams will be free; dental up to $2000; vision will be up to $350 for glasses  Rep transferred us  to enrollment rep to complete plan enrollment process (phone [phone]). Rep provided pt with phone number for any f/u questions [phone], ext [phone]). Patient advised to call [place] Medicaid to ensure all address/communication is correct ([phone])  She will have coverage through this date from Medicare/Medicaid. Patient must acquire override code for this month to provide to pharmacies that fill prescriptions.  Phone: [phone]  Patient has appt today and requested call back.  Patient will need to call for override code for patient to proivde all pharmacies this month (phone [phone])  Sherry Pennant, PharmD, MPH, BCPS, CPP Clinical Pharmacist [facility])

## 2024-07-02 NOTE — Telephone Encounter (Signed)
 Patient wanted to let Dr Jeannetta know she cancelled her appointment because she has not had enough of the medication and that she did not get the labs done either due to not having enough medicine in her body to feel like the labs would be accurate.

## 2024-07-02 NOTE — Telephone Encounter (Signed)
 Patient called the office again for the pharmacy team ,advised I would have someone contact her.

## 2024-07-02 NOTE — Telephone Encounter (Signed)
 Patient contacted the office and requested a return call from the pharmacy team.

## 2024-07-03 ENCOUNTER — Other Ambulatory Visit: Payer: Self-pay

## 2024-07-03 ENCOUNTER — Inpatient Hospital Stay (HOSPITAL_BASED_OUTPATIENT_CLINIC_OR_DEPARTMENT_OTHER): Admitting: Physician Assistant

## 2024-07-03 DIAGNOSIS — D500 Iron deficiency anemia secondary to blood loss (chronic): Secondary | ICD-10-CM | POA: Diagnosis not present

## 2024-07-03 NOTE — Telephone Encounter (Signed)
 Patient called the office to see if Pharmacy team had found out any information, advised she had not but I would let them know she called to follow up on the concerns.

## 2024-07-04 ENCOUNTER — Encounter: Payer: Self-pay | Admitting: Oncology

## 2024-07-04 ENCOUNTER — Other Ambulatory Visit (HOSPITAL_COMMUNITY): Payer: Self-pay | Admitting: Pharmacy Technician

## 2024-07-04 ENCOUNTER — Other Ambulatory Visit: Payer: Self-pay

## 2024-07-04 ENCOUNTER — Encounter: Payer: Self-pay | Admitting: Internal Medicine

## 2024-07-04 ENCOUNTER — Ambulatory Visit: Admitting: Family

## 2024-07-04 ENCOUNTER — Encounter: Payer: Self-pay | Admitting: Physician Assistant

## 2024-07-04 ENCOUNTER — Telehealth: Payer: Self-pay

## 2024-07-04 ENCOUNTER — Other Ambulatory Visit (HOSPITAL_COMMUNITY): Payer: Self-pay

## 2024-07-04 MED ORDER — TRIAMCINOLONE ACETONIDE 0.1 % EX CREA: 1.0000 | TOPICAL_CREAM | Freq: Two times a day (BID) | CUTANEOUS | 1 refills | Status: AC | PRN

## 2024-07-04 NOTE — Telephone Encounter (Signed)
 Contacted pt and provided phone number to f/u with insurance to obtain override code for her medication. She will call us  back once this has been completed.

## 2024-07-04 NOTE — Telephone Encounter (Signed)
 Patient contacted the office to see if Dr. Jeannetta had sent in anything for her skin. She is still experiencing a lot of skin irritation. Please advise.

## 2024-07-04 NOTE — Telephone Encounter (Signed)
 Auth Submission: NO AUTH NEEDED Site of care: Site of care: [facility] Payer: MEDICARE A/B Medication & CPT/J Code(s) submitted: Feraheme  (ferumoxytol ) [id number] Diagnosis Code:  Route of submission (phone, fax, portal): Portal Phone # Fax # Auth type: Buy/Bill HB Units/visits requested: 510mg  x 2 doses Reference number:  Approval from: 07/04/24 to 10/01/24

## 2024-07-04 NOTE — Telephone Encounter (Signed)
 I sent a prescription for triamcinolone , a topical steroid cream she can try for itching rashes. Otherwise she should continue her medication at the prescribed dose and frequency.

## 2024-07-04 NOTE — Telephone Encounter (Signed)
 Pt wants Dr. Jeannetta to know that she is still experiencing severe skin irritation and the Benadryl  has not helped. Please advise.

## 2024-07-04 NOTE — Telephone Encounter (Signed)
 Contacted the patient and advised Dr. Jeannetta sent a prescription for triamcinolone , a topical steroid cream she can try for itching rashes. Otherwise she should continue her medication at the prescribed dose and frequency. Patient verbalized understanding.

## 2024-07-04 NOTE — Telephone Encounter (Signed)
 We have this information - Alwin talked to her earlier this morning. Rx for Actemra  processed for $12.15

## 2024-07-04 NOTE — Addendum Note (Signed)
 Addended by: JEANNETTA LONNI ORN on: 07/04/2024 03:09 PM   Modules accepted: Orders

## 2024-07-04 NOTE — Telephone Encounter (Signed)
 I will place infusion referral once it is active after 08/02/24

## 2024-07-04 NOTE — Telephone Encounter (Signed)
 Pt returned call and provided information:  BIN: [id number] PCN: [id number] ID: [id number]  Information added to [facility], test claim shows that her copay will be $12.15 for a 28 day supply. Credit card info collected and forwarded to [place] at [facility].   Pt also wants to make sure that (once her new insurance is active on 11/1) the infusion referral will be placed to the [facility] infusion center in [place], as it is two blocks from her house and would be very convenient for her.   Their phone number is [phone] and their fax is [phone]

## 2024-07-04 NOTE — Telephone Encounter (Signed)
 Patient contacted the office to state she believed she was approved for Actemra . Patient also gave an insurance number: [id number]. Please advise.

## 2024-07-04 NOTE — Progress Notes (Signed)
 Specialty Pharmacy Refill Coordination Note  Vanessa Lara is a 68 y.o. female contacted today regarding refills of specialty medication(s) Tocilizumab  (Actemra  ACTPen)   Patient requested Delivery   Delivery date: 07/09/24   Verified address: [address] [place]   Medication will be filled on 07/08/24.

## 2024-07-05 ENCOUNTER — Other Ambulatory Visit: Payer: Self-pay | Admitting: Surgical

## 2024-07-05 MED ORDER — HYDROXYZINE HCL 50 MG PO TABS: 50.0000 mg | ORAL_TABLET | Freq: Two times a day (BID) | ORAL | 0 refills | Status: DC | PRN

## 2024-07-05 NOTE — Addendum Note (Signed)
 Addended by: JEANNETTA LONNI ORN on: 07/05/2024 05:11 PM   Modules accepted: Orders

## 2024-07-05 NOTE — Telephone Encounter (Signed)
 We had another telephone call today 10/4 about rash not improving with topical steroid cream so far. Describes intenesly itchy rashes--that looks like a bump then develops red lines around it. Previous medications included atarax  and prednisone  that she is no longer taking which could be related. Also new treatments including actemra , iron  infusions, and reclast  infusion. Symptom not typical of GCA manifestation. Sent new Rx for 50 mg hydroxyzine  to try this weekend. I also recommended she take pictures and needs to get this examined in person somewhere if so refractory.

## 2024-07-07 ENCOUNTER — Other Ambulatory Visit: Payer: Self-pay | Admitting: Emergency Medicine

## 2024-07-07 ENCOUNTER — Other Ambulatory Visit: Payer: Self-pay

## 2024-07-07 ENCOUNTER — Ambulatory Visit

## 2024-07-07 DIAGNOSIS — D509 Iron deficiency anemia, unspecified: Secondary | ICD-10-CM

## 2024-07-07 MED ORDER — SODIUM CHLORIDE 0.9 % IV SOLN
510.0000 mg | Freq: Once | INTRAVENOUS | Status: DC
  Filled 2024-07-07: qty 17

## 2024-07-08 ENCOUNTER — Other Ambulatory Visit: Payer: Self-pay

## 2024-07-08 ENCOUNTER — Other Ambulatory Visit: Payer: Self-pay | Admitting: Emergency Medicine

## 2024-07-08 ENCOUNTER — Telehealth: Payer: Self-pay

## 2024-07-08 ENCOUNTER — Ambulatory Visit: Attending: Gastroenterology

## 2024-07-08 VITALS — BP 113/74 | HR 82 | Temp 97.4°F | Resp 16

## 2024-07-08 DIAGNOSIS — D509 Iron deficiency anemia, unspecified: Secondary | ICD-10-CM | POA: Insufficient documentation

## 2024-07-08 LAB — CBC WITH DIFFERENTIAL/PLATELET
Absolute Lymphocytes: 2009 {cells}/uL (ref 850–3900)
Absolute Monocytes: 606 {cells}/uL (ref 200–950)
Basophils Absolute: 83 {cells}/uL (ref 0–200)
Basophils Relative: 1 %
Eosinophils Absolute: 83 {cells}/uL (ref 15–500)
Eosinophils Relative: 1 %
HCT: 36.2 % (ref 35.0–45.0)
Hemoglobin: 11.8 g/dL (ref 11.7–15.5)
MCH: 30 pg (ref 27.0–33.0)
MCHC: 32.6 g/dL (ref 32.0–36.0)
MCV: 92.1 fL (ref 80.0–100.0)
MPV: 9.7 fL (ref 7.5–12.5)
Monocytes Relative: 7.3 %
Neutro Abs: 5520 {cells}/uL (ref 1500–7800)
Neutrophils Relative %: 66.5 %
Platelets: 389 Thousand/uL (ref 140–400)
RBC: 3.93 Million/uL (ref 3.80–5.10)
RDW: 15.6 % — ABNORMAL HIGH (ref 11.0–15.0)
Total Lymphocyte: 24.2 %
WBC: 8.3 Thousand/uL (ref 3.8–10.8)

## 2024-07-08 MED ORDER — SODIUM CHLORIDE 0.9 % IV SOLN
510.0000 mg | Freq: Once | INTRAVENOUS | Status: AC
  Administered 2024-07-08: 510 mg via INTRAVENOUS
  Filled 2024-07-08: qty 17

## 2024-07-08 NOTE — Telephone Encounter (Signed)
 Patient would like call in regards to the new medication an its side affects. States it has helped with the headaches but she does not feel right. She is getting iron  infusions this afternoon. States her whole body feels weird, no energy to anything. She would like a call after 3  when she gets back from getting her infusions.

## 2024-07-08 NOTE — Telephone Encounter (Signed)
 Patient contacted the office and requested a call back from the pharmacy team. Patient states her call back number is [phone].

## 2024-07-08 NOTE — Progress Notes (Signed)
 Patient called regarding defective actemra  devices. Provided patient information for manufacture to request replacement. Patient aware to call pharmacy with any issues regarding manufacture replacement.

## 2024-07-08 NOTE — Telephone Encounter (Signed)
 Spoke with patient in detail about symptoms. She states she feel is constantly tired, confused, and forgetful of what she is doing.She is on Actemra  for GCA and RA. I advised that if she ends up coming off of Actemra  there is risk for GCA flare which will end up requiring steroids which she does not want. These symptoms are non-specific and unable to just be attributed to Actemra .  I advised patient that she may space Actemra  to every 10-14 days if she prefers but she is to seek emergent care if she develop severe or newfound headaches or vision changes as this could potentially indicate GCA flare. She would rather continue weekly Actemra  for a couple of more doses and if these symptoms are persistent then she will space to every other week and if still persistent, notify our office.  Sherry Pennant, PharmD, MPH, BCPS, CPP Clinical Pharmacist [facility])

## 2024-07-08 NOTE — Progress Notes (Signed)
 Diagnosis: Iron  Deficiency Anemia  Provider:  Lamon, R  Procedure: IV Infusion  IV Type: Peripheral, IV Location: L Antecubital  Feraheme  (Ferumoxytol ), Dose: 510 mg  Infusion Start Time: 1318  Infusion Stop Time: 1333  Post Infusion IV Care: Observation period completed and Peripheral IV Discontinued  Discharge: Condition: Good, Destination: Home . AVS Provided  Performed by:  Delon ONEIDA Officer, RN

## 2024-07-09 ENCOUNTER — Ambulatory Visit: Admitting: Surgical

## 2024-07-09 ENCOUNTER — Encounter: Payer: Self-pay | Admitting: Surgical

## 2024-07-09 ENCOUNTER — Other Ambulatory Visit: Payer: Self-pay

## 2024-07-09 DIAGNOSIS — M170 Bilateral primary osteoarthritis of knee: Secondary | ICD-10-CM

## 2024-07-09 DIAGNOSIS — M25571 Pain in right ankle and joints of right foot: Secondary | ICD-10-CM | POA: Diagnosis not present

## 2024-07-09 DIAGNOSIS — M7661 Achilles tendinitis, right leg: Secondary | ICD-10-CM | POA: Diagnosis not present

## 2024-07-09 MED ORDER — METHYLPREDNISOLONE ACETATE 40 MG/ML IJ SUSP
20.0000 mg | INTRAMUSCULAR | Status: AC | PRN
  Administered 2024-07-09: 20 mg via INTRA_ARTICULAR

## 2024-07-09 MED ORDER — BUPIVACAINE HCL 0.25 % IJ SOLN
4.0000 mL | INTRAMUSCULAR | Status: AC | PRN
  Administered 2024-07-09: 4 mL via INTRA_ARTICULAR

## 2024-07-09 MED ORDER — BUPIVACAINE HCL 0.5 % IJ SOLN
1.0000 mL | INTRAMUSCULAR | Status: AC | PRN
  Administered 2024-07-09: 1 mL via INTRA_ARTICULAR

## 2024-07-09 MED ORDER — TRIAMCINOLONE ACETONIDE 40 MG/ML IJ SUSP
40.0000 mg | INTRAMUSCULAR | Status: AC | PRN
  Administered 2024-07-09: 40 mg via INTRA_ARTICULAR

## 2024-07-09 NOTE — Progress Notes (Signed)
 Office Visit Note   Patient: Vanessa Lara           Date of Birth: 03-15-1956           MRN: [id number] Visit Date: 07/09/2024 Requested by: Suanne Pfeiffer, NP [address] [place],  [place] [address] PCP: Suanne Pfeiffer, NP  Subjective: Chief Complaint  Patient presents with   bilateral knee pain   right ankle pain    HPI: Vanessa Lara is a 68 y.o. female who presents to the office reporting bilateral knee pain.  Also notes right ankle pain.  She slid down onto her right ankle about a month ago and has had some persistent medial sided pain since then.  She also struck the lateral aspect of her ankle against a metal rod about a week after that incident and has had some lateral sided pain as well.  She has had good relief with prior ankle injections with last injection about 1 year ago by Dr. Harden by her history.  She denies any recent injury to either knee.  Has had prior gel and cortisone injections with the last injection about a year ago that has given her good relief of her knee pain.  She would like to repeat knee injections..                ROS: All systems reviewed are negative as they relate to the chief complaint within the history of present illness.  Patient denies fevers or chills.  Assessment & Plan: Visit Diagnoses:  1. Pain in right ankle and joints of right foot     Plan: Plan at this time is injection into the right knee and right ankle.  Patient tolerated both procedures well without complication.  We can see her back in 2 weeks for injection into the left knee.  After that we will start the process for gel injections to be done 3 months from today around January 8.  She also has some tenderness over the Achilles tendon without defect.  She has good strength.  She has had good relief of quadricep tendinitis with shockwave therapy with Dr. Burnetta so if this Achilles tendinitis persists, could consider that as a treatment option.  We will reevaluate in 2  weeks.  AP, oblique, lateral views of right ankle reviewed.  No fracture or dislocation.  Mild degenerative changes of the ankle joint noted.  Osteopenic appearance noted to distal tibia/fibula.  Follow-Up Instructions: No follow-ups on file.   Orders:  Orders Placed This Encounter  Procedures   DG Ankle Complete Right   No orders of the defined types were placed in this encounter.     Procedures: Large Joint Inj: R knee on 07/09/2024 11:38 AM Indications: diagnostic evaluation, joint swelling and pain Details: 25 G 1.5 in needle, superolateral approach  Arthrogram: No  Medications: 4 mL bupivacaine  0.25 %; 40 mg triamcinolone  acetonide 40 MG/ML Outcome: tolerated well, no immediate complications Procedure, treatment alternatives, risks and benefits explained, specific risks discussed. Consent was given by the patient. Immediately prior to procedure a time out was called to verify the correct patient, procedure, equipment, support staff and site/side marked as required. Patient was prepped and draped in the usual sterile fashion.    Medium Joint Inj: R ankle on 07/09/2024 11:38 AM Indications: pain, joint swelling and diagnostic evaluation Details: 25 G 1.5 in needle, anteromedial approach Medications: 1 mL bupivacaine  0.5 %; 20 mg methylPREDNISolone  acetate 40 MG/ML (1/2  mL bupivacaine  0.5 %) Outcome: tolerated well, no immediate complications Procedure, treatment alternatives, risks and benefits explained, specific risks discussed. Consent was given by the patient. Immediately prior to procedure a time out was called to verify the correct patient, procedure, equipment, support staff and site/side marked as required. Patient was prepped and draped in the usual sterile fashion.       Clinical Data: No additional findings.  Objective: Vital Signs: There were no vitals taken for this visit.  Physical Exam:  Constitutional: Patient appears well-developed HEENT:  Head:  Normocephalic Eyes:EOM are normal Neck: Normal range of motion Cardiovascular: Normal rate Pulmonary/chest: Effort normal Neurologic: Patient is alert Skin: Skin is warm Psychiatric: Patient has normal mood and affect  Ortho Exam: Ortho exam demonstrates right knee with no effusion.  Full range of motion.  No cellulitis or skin changes.  Does have some tenderness diffusely throughout the right knee.  No pain with hip range of motion.  Able to perform straight leg raise.  She also has palpable DP pulse of the right lower extremity.  Right ankle without any significant swelling or effusion noted.  Has some tenderness over the ankle joint line but no focal tenderness over the medial or lateral malleoli.  Pain worse with passive dorsiflexion and plantarflexion of the ankle.  She does have intact EHL, dorsiflexion, plantarflexion, inversion, eversion.  Specialty Comments:  MRI CERVICAL SPINE WITHOUT CONTRAST   TECHNIQUE: Multiplanar, multisequence MR imaging of the cervical spine was performed. No intravenous contrast was administered.   COMPARISON:  None.   FINDINGS: Alignment: 2 mm retrolisthesis of C5 on C6.   Vertebrae: No acute fracture, evidence of discitis, or bone lesion.   Cord: Normal signal and morphology.   Posterior Fossa, vertebral arteries, paraspinal tissues: Posterior fossa demonstrates no focal abnormality. Vertebral artery flow voids are maintained. Paraspinal soft tissues are unremarkable.   Disc levels:   Discs: Degenerative disease with disc height loss at C4-5 and C5-6.   C2-3: No significant disc bulge. No neural foraminal stenosis. No central canal stenosis.   C3-4: Mild broad-based disc bulge. No foraminal or central canal stenosis.   C4-5: Mild broad-based disc bulge. No foraminal or central canal stenosis.   C5-6: Mild broad-based disc bulge. Bilateral uncovertebral degenerative changes. Moderate left and severe right foraminal stenosis. No spinal  stenosis.   C6-7: Mild broad-based disc bulge. No foraminal or central canal stenosis.   C7-T1: No significant disc bulge. No neural foraminal stenosis. No central canal stenosis.   IMPRESSION: 1. At C5-6 there is a mild broad-based disc bulge. Bilateral uncovertebral degenerative changes. Moderate left and severe right foraminal stenosis. 2. Otherwise, mild cervical spine spondylosis as described above.     Electronically Signed   By: Julaine Blanch M.D.   On: 12/16/2021 10:51  Imaging: No results found.   PMFS History: Patient Active Problem List   Diagnosis Date Noted   Hyponatremia 05/07/2024   Temporal arteritis syndrome (HCC) 05/07/2024    Class: Acute   Hypotension 05/07/2024   Chronic gastritis without bleeding 08/01/2023   Generalized anxiety disorder 08/01/2023   Redundant colon 08/01/2023   Mixed hyperlipidemia 08/01/2023   History of CVA (cerebrovascular accident) 11/14/2022   Insomnia due to medical condition 03/15/2022   Degeneration of intervertebral disc of cervical region 12/28/2021   Physical deconditioning 11/11/2021   Elevated serum lactate dehydrogenase (LDH) 08/17/2021   Vitamin D  deficiency 08/17/2021   Vitamin B12 deficiency 07/19/2021   Hardening of the aorta (main artery of  the heart) 06/21/2021   Solitary lung nodule 06/15/2021   Protein-calorie malnutrition, severe 05/18/2021   Osteoporosis 06/15/2020   Iron  deficiency anemia 04/09/2020   Primary osteoarthritis involving multiple joints 12/31/2018   Rheumatoid arthritis involving multiple sites with positive rheumatoid factor (HCC) 12/31/2018   Irritable bowel syndrome with both constipation and diarrhea 10/28/2018   Mitral valve prolapse 11/23/2015   Past Medical History:  Diagnosis Date   Anemia    Anxiety    Arthritis    RA   Bursitis    Cataract    CHF (congestive heart failure) (HCC)    Collagen vascular disease    Depression    Endometriosis    Heart murmur    High  cholesterol    Hypertension    IBS (irritable bowel syndrome)    Lymphadenopathy    Mitral valve prolapse    Osteoporosis    RA (rheumatoid arthritis) (HCC)    Rotator cuff tear    bilateral   Stroke (HCC) 06/2022   Uterus, adenomyosis     Family History  Problem Relation Age of Onset   Bone cancer Mother    Brain cancer Father    Brain cancer Sister    Breast cancer Sister    Renal Disease Sister    Pancreatic cancer Neg Hx    Esophageal cancer Neg Hx    Stomach cancer Neg Hx    Rectal cancer Neg Hx    Colon polyps Neg Hx     Past Surgical History:  Procedure Laterality Date   ARTERY BIOPSY Left 05/08/2024   Procedure: LEFT TEMPORAL ARTERY BIOPSY;  Surgeon: Pearline Norman RAMAN, MD;  Location: [facility] OR;  Service: Vascular;  Laterality: Left;   AXILLARY LYMPH NODE BIOPSY Right 10/12/2020   Procedure: RIGHT AXILLARY LYMPH NODE BIOPSY EXCISION;  Surgeon: Aron Shoulders, MD;  Location: [facility];  Service: General;  Laterality: Right;   BUNIONECTOMY Right    CARPAL TUNNEL RELEASE Right    COLONOSCOPY     COLONOSCOPY N/A 12/13/2015   Procedure: COLONOSCOPY;  Surgeon: Claudis RAYMOND Rivet, MD;  Location: [facility];  Service: Endoscopy;  Laterality: N/A;  9:55   Fatty tumor     2011 (left arm)   TONSILLECTOMY     TOTAL ABDOMINAL HYSTERECTOMY     2003   Social History   Occupational History   Occupation: retired  Tobacco Use   Smoking status: Some Days    Current packs/day: 0.00    Average packs/day: 0.5 packs/day for 33.0 years (16.5 ttl pk-yrs)    Types: Cigarettes    Start date: 25    Last attempt to quit: 2013    Years since quitting: 12.7   Smokeless tobacco: Never  Vaping Use   Vaping status: Never Used  Substance and Sexual Activity   Alcohol use: No    Alcohol/week: 0.0 standard drinks of alcohol   Drug use: No   Sexual activity: Not Currently

## 2024-07-15 ENCOUNTER — Telehealth: Payer: Self-pay | Admitting: *Deleted

## 2024-07-15 NOTE — Telephone Encounter (Signed)
 Patient called stated that she is doing really well on Actemra  and has had so much energy that she has overworked her body. She states that she gets itchy on prednisone  and developed white spots that are itchy - diffuse under arms, breast, axilla. She does not scratch but rubs which causes redness. She said it is similar to when she took prednisone  packs.  I have advised her to reach out to PCP for workup of dermatologic concerns. It is difficult to ascertain if she is experiencing hives or drug-induced based without visualization. I have also recommended that she take as clear a picture as possible of ongoing problem today to show her PCP (f/u is in 1.5 weeks with PCP)  Of note, she has figured out a method to inject Actemra  at home and has been successful. Advised that infusion is not more effective than subcut formulation, but we have utilized infusions in patients due to insurance reasons and personal preference.  Sherry Pennant, PharmD, MPH, BCPS, CPP Clinical Pharmacist [facility])

## 2024-07-15 NOTE — Telephone Encounter (Signed)
 Patient contacted the office and requested a call back from [facility]. Patient states she can be reached at [phone].

## 2024-07-16 ENCOUNTER — Encounter (INDEPENDENT_AMBULATORY_CARE_PROVIDER_SITE_OTHER): Payer: Self-pay | Admitting: Gastroenterology

## 2024-07-16 ENCOUNTER — Ambulatory Visit: Admitting: Family

## 2024-07-21 ENCOUNTER — Ambulatory Visit: Payer: Medicare HMO

## 2024-07-23 ENCOUNTER — Ambulatory Visit: Admitting: Surgical

## 2024-07-24 NOTE — Progress Notes (Deleted)
 Office Visit Note  Patient: Vanessa Lara             Date of Birth: [birth year]           MRN: [id number]             PCP: Suanne Pfeiffer, NP Referring: Suanne Pfeiffer, NP Visit Date: 08/06/2024   Subjective:  No chief complaint on file.   History of Present Illness: Vanessa Lara is a 68 y.o. female here for follow up with rheumatoid arthritis on HCQ 200 mg daily x5 days weekly who presents with severe pain and medication intolerance.   Previous HPI 05/06/2024 Vanessa Lara is a 68 y.o. female here for follow up with rheumatoid arthritis on HCQ 200 mg daily x5 days weekly who presents with severe pain and medication intolerance. She presents with inadequate response to current treatment and concerns about medication side effects.   She has had multiple recent injections throughout orthopedics clinic, but these only provide relief for about a month and a half. She is frustrated with the limited duration of relief and the restrictions on the frequency and dosage of these injections. She is seeking alternative treatments that do not involve narcotics.   She uses a muscle relaxer that causes significant cognitive impairment, limiting her ability to function during the day. To manage this, she takes small doses throughout the day, but this is not ideal. She is concerned about adding more medication due to her long-standing irritable bowel syndrome (IBS), which has been present for 53 years and has left her gastrointestinal system compromised.   She is interested in trying a different rheumatoid arthritis medication due to previous drugs either causing side effects or being ineffective. She recalls a past x-ray showing bone erosion and wants to prevent progression. Her last sedimentation rate was 58 in April.   She describes significant functional limitations due to her condition, particularly in her hands, which she describes as having difficulty with manipulation. She also experiences  issues with her shoulders and back, where she received an injection two weeks ago that has since worn off. She notes muscle atrophy and difficulty maintaining foot position, which she attributes to overall muscle weakness.   Her diet is limited due to IBS, and she struggles to maintain adequate calorie intake. She is sevrely underweight and worsening, down to 62 lbs and BMI of 12. She eats red meat, fish, chicken, green vegetables, and bread but avoids high-fat foods due to gastrointestinal distress.   She recalls a past incident of heart failure induced by medication, which caused significant water  weight gain without typical symptoms like swelling. She is cautious about medication side effects, particularly those affecting her heart.         Previous HPI 01/07/2024 Vanessa Lara is a 68 year old female with rheumatoid arthritis on HCQ 200 mg daily x5 days weekly who presents with severe pain and medication intolerance.   She experiences severe pain due to arthritis, exacerbated by muscle spasms in her back. The pain is intense and significantly impacts her quality of life, leaving her feeling 'miserable' and without a 'happy place'. Flare-ups occur every other day or every third day, preventing her from performing daily activities.   She has a history of intolerance to several medications. Azathioprine  caused severe gastrointestinal issues, and she is currently on hydroxychloroquine  for arthritis. She cannot take codeine  due to gastrointestinal side effects related to her IBS. Muscle relaxants like Flexeril  have been ineffective in managing  her symptoms.   A recent bone marrow study was performed without anesthesia due to low blood pressure, which she found distressing. She experiences dizziness throughout the day, which she suspects may be related to anemia or other underlying issues. She has gained a small amount of weight recently, about four ounces.   She has been unable to follow up with an  orthopedist due to her condition flaring up, making it difficult for her to travel. She also struggles with arranging transportation as her plans often fall through at the last minute.      Previous HPI 11/19/23 Vanessa Lara is a 67 year old female with rheumatoid arthritis on hydroxychloroquine  200 mg once daily for 5 days per week.   She experiences significant pain and stiffness, particularly on Sundays, leading her to take an additional half dose of her medication on Sundays for the past two weekends. She describes waking up with severe stiffness and being unable to move by Monday if she does not take the extra dose. She is seeking an alternative medication to manage her symptoms more effectively.   She describes pain associated with bursitis and possible rotator cuff issues, noting a pressure point that radiates from her shoulder down towards her armpit and into her upper arm, forming a 'V' shape. She is scheduled to receive an injection for this pain.   She reports issues with her foot, including a previously twisted or sprained ankle that healed but then became painful and red, particularly around a bone that feels bruised. She also mentions a rod in her big toe that has shifted, causing pain and misalignment of her toes. She was previously told that surgery could correct her toes, but now her arthritis and varicose veins have worsened, raising concerns about circulation, infection, and potential amputation. Her bone density is very low, complicating surgical options.   She mentions her knuckles are sore, with one being particularly bad, and she experiences pain that radiates down her ear. She also notes fluctuations in her weight, questioning whether it is due to scale differences or her condition.      Previous HPI 07/04/23 Vanessa Lara is a 68 y.o. female here for follow up for seropositive RA and osteoarthritis and osteoporosis she stopped hydroxychloroquine  after last visit due to  concern about cardiac arrhythmia alongside Paxil  and duloxetine .  She was only able to take a low-dose of the medication she did feel it improved her overall condition but still had persistent arthritis of numerous areas regardless.  She had an updated bone density scan on July 15 still with osteoporosis of the lumbar spine although better than 2022 and with femur osteopenia similar to the previous test.  She is taking Venofer  for iron  deficiency anemia this ongoing to correct it with her goal of enough to tolerate foot surgery.  She underwent endoscopy with no upper or lower GI findings to account for heme positive stool. Acute problem today increased right ankle pain and swelling started since 3 weeks ago.  She did not recall any fall trip or other trauma to that area.  Initially there was a high degree of swelling he put a wrap around this and can see prominent indentation the swelling has largely decreased compared to the outset but still has pain with direct pressure and with weightbearing.   Imaging reviewed DEXA Review [facility] Spine L1-L2 04/16/23  -3.8    0.712 g/cm2 04/12/21  -4.5    0.627 g/cm2 04/13/20  -4.2  0.665 g/cm2   DualFemur Total Mean 04/16/23  -2.6    0.680 g/cm2 04/12/21  -2.6    0.676 g/cm2 04/13/20  -2.2    0.736 g/cm2     03/08/23 SUSY PLACZEK is a 68 y.o. female here for follow up for seropositive RA.  Currently off any disease specific medication she cannot tolerate taking the sulfasalazine  due to severe stomach irritation described as burning up her stomach.  Continues having joint pain and stiffness on a daily basis overall feels her function is very poor due to the severity.  Had labs last month still anemic at hemoglobin of 9.8 being treated with infusion for iron  deficiency anemia.   01/16/23 MAYKAYLA HIGHLEY is a 68 y.o. female here for follow up for seropositive RA.  Currently health maintenance treatment after discontinuing 5 mg prednisone  is on medication for joint pain  for RA along with osteoarthritis and chronic pain syndrome.  Had recent knee steroid injection since her last visit but reports overall widespread symptoms are severe.  Particularly is been noticing some increased trouble with pain and some swelling involving the wrist worse on left side.   10/03/22 AFTEN LIPSEY is a 68 y.o. female here for follow up for seropositive RA currently off long term DMARD with prednisone  5 mg daily as needed.  She is overall not doing well feels like pain and stiffness is a big problem but more severe is her fatigue and feels a generalized getting weaker with difficulty moving and with her upper body strength.  She pretty consistently has to take the prednisone  for otherwise does not move around much during the day.  Continues to get a benefit with the Flexeril  at nighttime with slight decrease in frequency of awakenings.  She is also concerned about getting an update to her bone density testing ongoing oral alendronate  treatment.  She is also still having a lot of pain and difficulty with walking from her claw toe deformities on the foot and planning to follow-up with surgery clinic about management of this.   06/28/22 JOSIAH WOJTASZEK is a 68 y.o. female here for follow up for seropositive RA now off of treatment after stopping the Kevzara .  Since she stopped taking the medication her complaint of night sweats and fatigue have improved. She has seen a worsening of joint pain in several areas particularly around the MCP joints of both hands. For knee pain she saw orthopedic surgery clinic yesterday and had injections on both sides.  She also discussed getting set up to do right SI joint steroid injection.  Biggest complaint today is back pain on the right side around the middle of the back and around the scapula.  This bothers her especially with certain movements and trying to bend side to side.    05/18/2022  ROSHUNDA KEIR is a 68 y.o. female here for follow up for seropositive  rheumatoid arthritis on Kevzara  150 mg [place] q14days. She feels arthritis has been fairly well controlled but has pain worse in her left foot mostly. She had GI issues suspected UTI but with negative culture since last visit. She feels a sensation like her brain is numb. Also has ongoing upper neck pain without radiating symptoms.    02/07/2022 MARESA MORASH is a 68 y.o. female here for follow up for rheumatoid arthritis with chronic joint pain in multiple areas and chronic fatigue labs showing elevated rheumatoid factor. Kevzara  new start on 12/15/2021. She felt an improvement when taking the  injections within first weeks. Accidentally took the shots twice in a week due to forgetfulness or mistake but did not notice any major problems. She called in on account of increased joint pain but felt no improvement with the prescribed medrol  dose pack. She did feel more lightheadedness or off balance feeling while taking this so stopped after about 3 days. Currently back pain and her left foot pain are worst issues.   Previous HPI 12/05/2021 TRENITA HULME is a 68 y.o. female here for evaluation of rheumatoid arthritis with chronic joint pain in multiple areas and chronic fatigue labs showing elevated rheumatoid factor. She has history of mitral valve prolapse, severe osteoporosis, osteoarthritis, and severe weight loss and deconditioning. She was diagnosed with seropositive RA in 2018 with Dr. Curt and most recently saw Dr. Mai in 11/2020. At that time recommended to start hydroxychloroquine  for inflammatory arthritis symptoms. She previously tried methotrexate, simponi  aria, and orencia  with poor tolerance. Imaging consistent with degenerative arthritis in cervical spine and knee but also with some partial tendon tear in shoulder and some synovitis present in knee on MRI imaging. She has taken multiple medications for pain including NSAIDs, prednisone , gabapentin , tramadol , and hydrocodone  or oxycodone . She has a  lot of pain in multiple areas but is also severely fatigued. She has significant weight loss down to 70 lbs unintentionally reports chronic IBS issues but no recent changes during this period of weight loss. Joint pain also limits her activity quite a bit with chronic deformities and also ongoing swelling in bilateral hands. She has also developed left sided headache localized above the temporal area. She has a lot of pain at the base of the skull and occiput without clear radiation of symptoms. She is scheduled to see Dr. Rush for headache evaluation and management 3/28.   DMARD Hx AZA LEF HCQ SSZ MTX Simponi  Orencia  Kevzara    05/2017 RF 15 CCP 32 HBV neg HCV neg   No Rheumatology ROS completed.   PMFS History:  Patient Active Problem List   Diagnosis Date Noted   Hyponatremia 05/07/2024   Temporal arteritis syndrome (HCC) 05/07/2024    Class: Acute   Hypotension 05/07/2024   Chronic gastritis without bleeding 08/01/2023   Generalized anxiety disorder 08/01/2023   Redundant colon 08/01/2023   Mixed hyperlipidemia 08/01/2023   History of CVA (cerebrovascular accident) 11/14/2022   Insomnia due to medical condition 03/15/2022   Degeneration of intervertebral disc of cervical region 12/28/2021   Physical deconditioning 11/11/2021   Elevated serum lactate dehydrogenase (LDH) 08/17/2021   Vitamin D  deficiency 08/17/2021   Vitamin B12 deficiency 07/19/2021   Hardening of the aorta (main artery of the heart) 06/21/2021   Solitary lung nodule 06/15/2021   Protein-calorie malnutrition, severe 05/18/2021   Osteoporosis 06/15/2020   Iron  deficiency anemia 04/09/2020   Primary osteoarthritis involving multiple joints 12/31/2018   Rheumatoid arthritis involving multiple sites with positive rheumatoid factor (HCC) 12/31/2018   Irritable bowel syndrome with both constipation and diarrhea 10/28/2018   Mitral valve prolapse 11/23/2015    Past Medical History:  Diagnosis Date    Anemia    Anxiety    Arthritis    RA   Bursitis    Cataract    CHF (congestive heart failure) (HCC)    Collagen vascular disease    Depression    Endometriosis    Heart murmur    High cholesterol    Hypertension    IBS (irritable bowel syndrome)    Lymphadenopathy  Mitral valve prolapse    Osteoporosis    RA (rheumatoid arthritis) (HCC)    Rotator cuff tear    bilateral   Stroke [facility]) 06/2022   Uterus, adenomyosis     Family History  Problem Relation Age of Onset   Bone cancer Mother    Brain cancer Father    Brain cancer Sister    Breast cancer Sister    Renal Disease Sister    Pancreatic cancer Neg Hx    Esophageal cancer Neg Hx    Stomach cancer Neg Hx    Rectal cancer Neg Hx    Colon polyps Neg Hx    Past Surgical History:  Procedure Laterality Date   ARTERY BIOPSY Left 05/08/2024   Procedure: LEFT TEMPORAL ARTERY BIOPSY;  Surgeon: Pearline Norman RAMAN, MD;  Location: [facility] OR;  Service: Vascular;  Laterality: Left;   AXILLARY LYMPH NODE BIOPSY Right 10/12/2020   Procedure: RIGHT AXILLARY LYMPH NODE BIOPSY EXCISION;  Surgeon: Aron Shoulders, MD;  Location: [facility];  Service: General;  Laterality: Right;   BUNIONECTOMY Right    CARPAL TUNNEL RELEASE Right    COLONOSCOPY     COLONOSCOPY N/A 12/13/2015   Procedure: COLONOSCOPY;  Surgeon: Claudis RAYMOND Rivet, MD;  Location[facility];  Service: Endoscopy;  Laterality: N/A;  9:55   Fatty tumor     2011 (left arm)   TONSILLECTOMY     TOTAL ABDOMINAL HYSTERECTOMY     2003   Social History   Social History Narrative   Not on file   Immunization History  Administered Date(s) Administered   Influenza, Quadrivalent, Recombinant, Inj, Pf 07/11/2017     Objective: Vital Signs: There were no vitals taken for this visit.   Physical Exam   Musculoskeletal Exam: ***  CDAI Exam: CDAI Score: -- Patient Global: --; Provider Global: -- Swollen: --; Tender: -- Joint Exam 08/06/2024   No joint exam  has been documented for this visit   There is currently no information documented on the homunculus. Go to the Rheumatology activity and complete the homunculus joint exam.  Investigation: No additional findings.  Imaging: No results found.  Recent Labs: Lab Results  Component Value Date   WBC 8.3 07/08/2024   HGB 11.8 07/08/2024   PLT 389 07/08/2024   NA 137 07/02/2024   K 3.3 (L) 07/02/2024   CL 102 07/02/2024   CO2 23 07/02/2024   GLUCOSE 92 07/02/2024   BUN 9 07/02/2024   CREATININE 0.61 07/02/2024   BILITOT <0.2 07/02/2024   ALKPHOS 75 07/02/2024   AST 28 07/02/2024   ALT 13 07/02/2024   PROT 6.0 (L) 07/02/2024   ALBUMIN 4.0 07/02/2024   CALCIUM  8.8 (L) 07/02/2024   GFRAA 92 11/06/2019   QFTBGOLDPLUS NEGATIVE 05/06/2024    Speciality Comments: Patient states they can't get one till next year   Procedures:  No procedures performed Allergies: Abatacept , Codeine , Golimumab , Hydrocodone , Methylprednisolone , Methotrexate and trimetrexate, and Prednisone    Assessment / Plan:     Visit Diagnoses: No diagnosis found.  ***  Orders: No orders of the defined types were placed in this encounter.  No orders of the defined types were placed in this encounter.    Follow-Up Instructions: No follow-ups on file.   Karah Caruthers M Nickie Deren, CMA  Note - This record has been created using Animal nutritionist.  Chart creation errors have been sought, but may not always  have been located. Such creation errors do not reflect on  the  standard of medical care.

## 2024-07-28 ENCOUNTER — Telehealth: Payer: Self-pay | Admitting: Pharmacist

## 2024-07-28 NOTE — Telephone Encounter (Signed)
 Patient reached out and requested to speak with pharmacy team. She asked if we could run a test claim for Actemra  infusions to give her a quote. Advised that we cannot provide a quote for infusions but if she is losing her Medicaid assistance, she will have a 20% coinsurance that will be billed. This will likely be unaffordable for her. She verbalized understanding  She will continue Actemra  162mg  subcut every 7 days (she states she was unable to space to every 14 days due to joint pain recurrence)  Sherry Pennant, PharmD, MPH, BCPS, CPP Clinical Pharmacist [facility])

## 2024-07-29 ENCOUNTER — Other Ambulatory Visit: Payer: Self-pay

## 2024-07-30 ENCOUNTER — Telehealth: Payer: Self-pay | Admitting: *Deleted

## 2024-07-30 NOTE — Telephone Encounter (Signed)
 Patient contacted the office and left message asking about labs at [facility].  Returned call to patient and she states she would like to know if Dr. Jeannetta would order a sodium on her. Patient advised she should reach out to her PCP. Patient states  Dr. Jeannetta is the one that treats this. I advised patient that he treats her for the temporal arteritis and she would need to reach out to her PCP to have labs or evaluation. Patient was not happy with recommendation.

## 2024-07-31 ENCOUNTER — Other Ambulatory Visit (HOSPITAL_COMMUNITY): Payer: Self-pay

## 2024-08-01 ENCOUNTER — Other Ambulatory Visit: Payer: Self-pay | Admitting: Pharmacy Technician

## 2024-08-01 ENCOUNTER — Other Ambulatory Visit: Payer: Self-pay

## 2024-08-01 ENCOUNTER — Encounter: Payer: Self-pay | Admitting: Physician Assistant

## 2024-08-01 ENCOUNTER — Encounter: Payer: Self-pay | Admitting: Internal Medicine

## 2024-08-01 ENCOUNTER — Encounter: Payer: Self-pay | Admitting: Oncology

## 2024-08-01 ENCOUNTER — Ambulatory Visit: Admitting: Sports Medicine

## 2024-08-01 NOTE — Progress Notes (Signed)
 Specialty Pharmacy Refill Coordination Note  Vanessa Lara is a 68 y.o. female contacted today regarding refills of specialty medication(s) Tocilizumab  (Actemra  ACTPen)   Patient requested Delivery   Delivery date: 08/13/24   Verified address: [address]  [name]   Medication will be filled on: 08/12/24

## 2024-08-04 ENCOUNTER — Encounter: Payer: Self-pay | Admitting: Radiology

## 2024-08-05 ENCOUNTER — Encounter: Payer: Self-pay | Admitting: Physician Assistant

## 2024-08-05 ENCOUNTER — Encounter: Payer: Self-pay | Admitting: Internal Medicine

## 2024-08-05 ENCOUNTER — Inpatient Hospital Stay

## 2024-08-05 ENCOUNTER — Encounter: Payer: Self-pay | Admitting: Oncology

## 2024-08-05 ENCOUNTER — Other Ambulatory Visit (HOSPITAL_COMMUNITY): Payer: Self-pay | Admitting: Internal Medicine

## 2024-08-06 ENCOUNTER — Inpatient Hospital Stay: Attending: Hematology

## 2024-08-06 ENCOUNTER — Other Ambulatory Visit: Payer: Self-pay | Admitting: Emergency Medicine

## 2024-08-06 ENCOUNTER — Encounter: Payer: Self-pay | Admitting: Emergency Medicine

## 2024-08-06 ENCOUNTER — Encounter: Attending: Gastroenterology | Admitting: Emergency Medicine

## 2024-08-06 ENCOUNTER — Ambulatory Visit: Admitting: Internal Medicine

## 2024-08-06 VITALS — BP 126/65 | HR 87 | Resp 17

## 2024-08-06 VITALS — BP 118/69 | HR 81 | Resp 16

## 2024-08-06 DIAGNOSIS — D500 Iron deficiency anemia secondary to blood loss (chronic): Secondary | ICD-10-CM

## 2024-08-06 DIAGNOSIS — M810 Age-related osteoporosis without current pathological fracture: Secondary | ICD-10-CM

## 2024-08-06 DIAGNOSIS — D509 Iron deficiency anemia, unspecified: Secondary | ICD-10-CM

## 2024-08-06 DIAGNOSIS — M316 Other giant cell arteritis: Secondary | ICD-10-CM | POA: Insufficient documentation

## 2024-08-06 DIAGNOSIS — M150 Primary generalized (osteo)arthritis: Secondary | ICD-10-CM | POA: Diagnosis present

## 2024-08-06 DIAGNOSIS — M0579 Rheumatoid arthritis with rheumatoid factor of multiple sites without organ or systems involvement: Secondary | ICD-10-CM | POA: Insufficient documentation

## 2024-08-06 DIAGNOSIS — E871 Hypo-osmolality and hyponatremia: Secondary | ICD-10-CM | POA: Insufficient documentation

## 2024-08-06 DIAGNOSIS — R5381 Other malaise: Secondary | ICD-10-CM

## 2024-08-06 DIAGNOSIS — K582 Mixed irritable bowel syndrome: Secondary | ICD-10-CM | POA: Insufficient documentation

## 2024-08-06 DIAGNOSIS — Z79899 Other long term (current) drug therapy: Secondary | ICD-10-CM

## 2024-08-06 DIAGNOSIS — D649 Anemia, unspecified: Secondary | ICD-10-CM

## 2024-08-06 LAB — CBC WITH DIFFERENTIAL/PLATELET
Abs Immature Granulocytes: 0.01 K/uL (ref 0.00–0.07)
Basophils Absolute: 0.1 K/uL (ref 0.0–0.1)
Basophils Relative: 1 %
Eosinophils Absolute: 0.1 K/uL (ref 0.0–0.5)
Eosinophils Relative: 2 %
HCT: 36.8 % (ref 36.0–46.0)
Hemoglobin: 12.3 g/dL (ref 12.0–15.0)
Immature Granulocytes: 0 %
Lymphocytes Relative: 25 %
Lymphs Abs: 1.6 K/uL (ref 0.7–4.0)
MCH: 31.5 pg (ref 26.0–34.0)
MCHC: 33.4 g/dL (ref 30.0–36.0)
MCV: 94.4 fL (ref 80.0–100.0)
Monocytes Absolute: 0.5 K/uL (ref 0.1–1.0)
Monocytes Relative: 7 %
Neutro Abs: 4.1 K/uL (ref 1.7–7.7)
Neutrophils Relative %: 65 %
Platelets: 257 K/uL (ref 150–400)
RBC: 3.9 MIL/uL (ref 3.87–5.11)
RDW: 14.8 % (ref 11.5–15.5)
WBC: 6.4 K/uL (ref 4.0–10.5)
nRBC: 0 % (ref 0.0–0.2)

## 2024-08-06 MED ORDER — DIPHENHYDRAMINE HCL 25 MG PO CAPS: 25.0000 mg | ORAL_CAPSULE | Freq: Once | ORAL | Status: DC

## 2024-08-06 MED ORDER — ZOLEDRONIC ACID 5 MG/100ML IV SOLN: 5.0000 mg | Freq: Once | INTRAVENOUS | Status: AC

## 2024-08-06 MED ORDER — SODIUM CHLORIDE 0.9 % IV SOLN
510.0000 mg | Freq: Once | INTRAVENOUS | Status: AC
  Administered 2024-08-06: 510 mg via INTRAVENOUS
  Filled 2024-08-06: qty 17

## 2024-08-06 MED ORDER — ZOLEDRONIC ACID 5 MG/100ML IV SOLN
5.0000 mg | Freq: Once | INTRAVENOUS | Status: AC
  Administered 2024-08-06: 5 mg via INTRAVENOUS

## 2024-08-06 MED ORDER — ACETAMINOPHEN 325 MG PO TABS: 650.0000 mg | ORAL_TABLET | Freq: Once | ORAL | Status: DC

## 2024-08-06 NOTE — Addendum Note (Signed)
 Addended by: DAINE NEST T on: 08/06/2024 03:56 PM   Modules accepted: Orders

## 2024-08-06 NOTE — Progress Notes (Addendum)
 Diagnosis: Iron  Deficiency Anemia  Provider:  JONELLE Barefoot   Procedure: IV Infusion  IV Type: Peripheral, IV Location: R Antecubital  Feraheme  (Ferumoxytol ), Dose: 510 mg  1Infusion Start TIme: 1428  Infusion Stop Time: 1443  Post Infusion IV Care: Observation period completed and Peripheral IV Discontinued  Discharge: Condition: Good, Destination: Home . AVS Declined  Performed by:  Delon ONEIDA Officer, RN

## 2024-08-06 NOTE — Progress Notes (Addendum)
 Diagnosis: Osteoporosis  Provider:  Jeannetta Roads MD  Procedure: IV Infusion  IV Type: Peripheral, IV Location: R Antecubital  Reclast  (Zolendronic Acid), Dose: 5 mg  Infusion Start Time: 1501  Infusion Stop Time: 1531  Post Infusion IV Care: Observation period completed and Peripheral IV Discontinued  Discharge: Condition: Good, Destination: Home . AVS Declined  Performed by:  Delon ONEIDA Officer, RN

## 2024-08-07 ENCOUNTER — Encounter: Payer: Self-pay | Admitting: Internal Medicine

## 2024-08-07 ENCOUNTER — Encounter: Admitting: Internal Medicine

## 2024-08-07 ENCOUNTER — Other Ambulatory Visit: Payer: Self-pay | Admitting: Internal Medicine

## 2024-08-07 ENCOUNTER — Encounter: Payer: Self-pay | Admitting: Oncology

## 2024-08-07 VITALS — BP 128/80 | HR 106 | Temp 97.4°F | Resp 17 | Ht 60.0 in | Wt 70.8 lb

## 2024-08-07 DIAGNOSIS — M0579 Rheumatoid arthritis with rheumatoid factor of multiple sites without organ or systems involvement: Secondary | ICD-10-CM

## 2024-08-07 DIAGNOSIS — R21 Rash and other nonspecific skin eruption: Secondary | ICD-10-CM

## 2024-08-07 DIAGNOSIS — M810 Age-related osteoporosis without current pathological fracture: Secondary | ICD-10-CM

## 2024-08-07 DIAGNOSIS — E871 Hypo-osmolality and hyponatremia: Secondary | ICD-10-CM

## 2024-08-07 DIAGNOSIS — M316 Other giant cell arteritis: Secondary | ICD-10-CM

## 2024-08-07 DIAGNOSIS — M150 Primary generalized (osteo)arthritis: Secondary | ICD-10-CM

## 2024-08-07 DIAGNOSIS — K582 Mixed irritable bowel syndrome: Secondary | ICD-10-CM

## 2024-08-07 NOTE — Progress Notes (Signed)
 Office Visit Note  Patient: Vanessa Lara             Date of Birth: 03-May-1956           MRN: [id number]             PCP: Suanne Pfeiffer, NP Referring: Suanne Pfeiffer, NP Visit Date: 08/07/2024   Subjective:  Medical Management of Chronic Issues (Patient has some questions about her labs and how she was supposed to get them drawn after starting the actemra )  Discussed the use of AI scribe software for clinical note transcription with the patient, who gave verbal consent to proceed.  History of Present Illness   Vanessa Lara is a 68 year old female with arthritis and giant cell arteritis and RA who presents for follow-up on Actemra  162 mg [place] weekly and HCQ 200 mg 5 days per week.  She is currently on Actemra  injections for her condition. Last month, she visited the orthopedics clinic and received injections in her knee and ankle for arthritis. She is scheduled for additional injections later this month.  She experiences significant pain in her thumb, describing it as 'the whole thumb's popping in and out,' with pain localized around a specific joint. This pain severely limits her daily activities.  Regarding her Actemra  treatment, she has not had her blood tests rechecked since starting the medication. Previous tests showed high sedimentation rate and C-reactive protein levels. She is also concerned about her sodium levels, which were previously low at 125 mmol/L, with accompanying low chloride, bicarbonate, and calcium  levels. She received saline treatment for this issue.  She reports experiencing migraines, noting that they are not as severe as before but still present. The pain is localized and does not extend around her hairline as it previously did.       Previous HPI 05/06/2024 Vanessa Lara is a 67 y.o. female here for follow up with rheumatoid arthritis on HCQ 200 mg daily x5 days weekly who presents with severe pain and medication intolerance. She presents with  inadequate response to current treatment and concerns about medication side effects.   She has had multiple recent injections throughout orthopedics clinic, but these only provide relief for about a month and a half. She is frustrated with the limited duration of relief and the restrictions on the frequency and dosage of these injections. She is seeking alternative treatments that do not involve narcotics.   She uses a muscle relaxer that causes significant cognitive impairment, limiting her ability to function during the day. To manage this, she takes small doses throughout the day, but this is not ideal. She is concerned about adding more medication due to her long-standing irritable bowel syndrome (IBS), which has been present for 53 years and has left her gastrointestinal system compromised.   She is interested in trying a different rheumatoid arthritis medication due to previous drugs either causing side effects or being ineffective. She recalls a past x-ray showing bone erosion and wants to prevent progression. Her last sedimentation rate was 58 in April.   She describes significant functional limitations due to her condition, particularly in her hands, which she describes as having difficulty with manipulation. She also experiences issues with her shoulders and back, where she received an injection two weeks ago that has since worn off. She notes muscle atrophy and difficulty maintaining foot position, which she attributes to overall muscle weakness.   Her diet is limited due to IBS, and she struggles to maintain  adequate calorie intake. She is sevrely underweight and worsening, down to 62 lbs and BMI of 12. She eats red meat, fish, chicken, green vegetables, and bread but avoids high-fat foods due to gastrointestinal distress.   She recalls a past incident of heart failure induced by medication, which caused significant water  weight gain without typical symptoms like swelling. She is cautious  about medication side effects, particularly those affecting her heart.         Previous HPI 01/07/2024 Vanessa Lara is a 68 year old female with rheumatoid arthritis on HCQ 200 mg daily x5 days weekly who presents with severe pain and medication intolerance.   She experiences severe pain due to arthritis, exacerbated by muscle spasms in her back. The pain is intense and significantly impacts her quality of life, leaving her feeling 'miserable' and without a 'happy place'. Flare-ups occur every other day or every third day, preventing her from performing daily activities.   She has a history of intolerance to several medications. Azathioprine  caused severe gastrointestinal issues, and she is currently on hydroxychloroquine  for arthritis. She cannot take codeine  due to gastrointestinal side effects related to her IBS. Muscle relaxants like Flexeril  have been ineffective in managing her symptoms.   A recent bone marrow study was performed without anesthesia due to low blood pressure, which she found distressing. She experiences dizziness throughout the day, which she suspects may be related to anemia or other underlying issues. She has gained a small amount of weight recently, about four ounces.   She has been unable to follow up with an orthopedist due to her condition flaring up, making it difficult for her to travel. She also struggles with arranging transportation as her plans often fall through at the last minute.      Previous HPI 11/19/23 Vanessa Lara is a 68 year old female with rheumatoid arthritis on hydroxychloroquine  200 mg once daily for 5 days per week.   She experiences significant pain and stiffness, particularly on Sundays, leading her to take an additional half dose of her medication on Sundays for the past two weekends. She describes waking up with severe stiffness and being unable to move by Monday if she does not take the extra dose. She is seeking an alternative medication  to manage her symptoms more effectively.   She describes pain associated with bursitis and possible rotator cuff issues, noting a pressure point that radiates from her shoulder down towards her armpit and into her upper arm, forming a 'V' shape. She is scheduled to receive an injection for this pain.   She reports issues with her foot, including a previously twisted or sprained ankle that healed but then became painful and red, particularly around a bone that feels bruised. She also mentions a rod in her big toe that has shifted, causing pain and misalignment of her toes. She was previously told that surgery could correct her toes, but now her arthritis and varicose veins have worsened, raising concerns about circulation, infection, and potential amputation. Her bone density is very low, complicating surgical options.   She mentions her knuckles are sore, with one being particularly bad, and she experiences pain that radiates down her ear. She also notes fluctuations in her weight, questioning whether it is due to scale differences or her condition.      Previous HPI 07/04/23 Vanessa Lara is a 68 y.o. female here for follow up for seropositive RA and osteoarthritis and osteoporosis she stopped hydroxychloroquine  after last visit due  to concern about cardiac arrhythmia alongside Paxil  and duloxetine .  She was only able to take a low-dose of the medication she did feel it improved her overall condition but still had persistent arthritis of numerous areas regardless.  She had an updated bone density scan on July 15 still with osteoporosis of the lumbar spine although better than 2022 and with femur osteopenia similar to the previous test.  She is taking Venofer  for iron  deficiency anemia this ongoing to correct it with her goal of enough to tolerate foot surgery.  She underwent endoscopy with no upper or lower GI findings to account for heme positive stool. Acute problem today increased right ankle pain  and swelling started since 3 weeks ago.  She did not recall any fall trip or other trauma to that area.  Initially there was a high degree of swelling he put a wrap around this and can see prominent indentation the swelling has largely decreased compared to the outset but still has pain with direct pressure and with weightbearing.   Imaging reviewed DEXA Review [facility] Spine L1-L2 04/16/23  -3.8    0.712 g/cm2 04/12/21  -4.5    0.627 g/cm2 04/13/20  -4.2    0.665 g/cm2   DualFemur Total Mean 04/16/23  -2.6    0.680 g/cm2 04/12/21  -2.6    0.676 g/cm2 04/13/20  -2.2    0.736 g/cm2     03/08/23 Vanessa Lara is a 68 y.o. female here for follow up for seropositive RA.  Currently off any disease specific medication she cannot tolerate taking the sulfasalazine  due to severe stomach irritation described as burning up her stomach.  Continues having joint pain and stiffness on a daily basis overall feels her function is very poor due to the severity.  Had labs last month still anemic at hemoglobin of 9.8 being treated with infusion for iron  deficiency anemia.   01/16/23 Vanessa Lara is a 68 y.o. female here for follow up for seropositive RA.  Currently health maintenance treatment after discontinuing 5 mg prednisone  is on medication for joint pain for RA along with osteoarthritis and chronic pain syndrome.  Had recent knee steroid injection since her last visit but reports overall widespread symptoms are severe.  Particularly is been noticing some increased trouble with pain and some swelling involving the wrist worse on left side.   10/03/22 Vanessa Lara is a 68 y.o. female here for follow up for seropositive RA currently off long term DMARD with prednisone  5 mg daily as needed.  She is overall not doing well feels like pain and stiffness is a big problem but more severe is her fatigue and feels a generalized getting weaker with difficulty moving and with her upper body strength.  She pretty consistently has to  take the prednisone  for otherwise does not move around much during the day.  Continues to get a benefit with the Flexeril  at nighttime with slight decrease in frequency of awakenings.  She is also concerned about getting an update to her bone density testing ongoing oral alendronate  treatment.  She is also still having a lot of pain and difficulty with walking from her claw toe deformities on the foot and planning to follow-up with surgery clinic about management of this.   06/28/22 Vanessa Lara is a 68 y.o. female here for follow up for seropositive RA now off of treatment after stopping the Kevzara .  Since she stopped taking the medication her complaint of night sweats and fatigue  have improved. She has seen a worsening of joint pain in several areas particularly around the MCP joints of both hands. For knee pain she saw orthopedic surgery clinic yesterday and had injections on both sides.  She also discussed getting set up to do right SI joint steroid injection.  Biggest complaint today is back pain on the right side around the middle of the back and around the scapula.  This bothers her especially with certain movements and trying to bend side to side.    05/18/2022  Vanessa Lara is a 68 y.o. female here for follow up for seropositive rheumatoid arthritis on Kevzara  150 mg [place] q14days. She feels arthritis has been fairly well controlled but has pain worse in her left foot mostly. She had GI issues suspected UTI but with negative culture since last visit. She feels a sensation like her brain is numb. Also has ongoing upper neck pain without radiating symptoms.    02/07/2022 Vanessa Lara is a 68 y.o. female here for follow up for rheumatoid arthritis with chronic joint pain in multiple areas and chronic fatigue labs showing elevated rheumatoid factor. Kevzara  new start on 12/15/2021. She felt an improvement when taking the injections within first weeks. Accidentally took the shots twice in a week due to  forgetfulness or mistake but did not notice any major problems. She called in on account of increased joint pain but felt no improvement with the prescribed medrol  dose pack. She did feel more lightheadedness or off balance feeling while taking this so stopped after about 3 days. Currently back pain and her left foot pain are worst issues.   Previous HPI 12/05/2021 Vanessa Lara is a 68 y.o. female here for evaluation of rheumatoid arthritis with chronic joint pain in multiple areas and chronic fatigue labs showing elevated rheumatoid factor. She has history of mitral valve prolapse, severe osteoporosis, osteoarthritis, and severe weight loss and deconditioning. She was diagnosed with seropositive RA in 2018 with Dr. Curt and most recently saw Dr. Mai in 11/2020. At that time recommended to start hydroxychloroquine  for inflammatory arthritis symptoms. She previously tried methotrexate, simponi  aria, and orencia  with poor tolerance. Imaging consistent with degenerative arthritis in cervical spine and knee but also with some partial tendon tear in shoulder and some synovitis present in knee on MRI imaging. She has taken multiple medications for pain including NSAIDs, prednisone , gabapentin , tramadol , and hydrocodone  or oxycodone . She has a lot of pain in multiple areas but is also severely fatigued. She has significant weight loss down to 70 lbs unintentionally reports chronic IBS issues but no recent changes during this period of weight loss. Joint pain also limits her activity quite a bit with chronic deformities and also ongoing swelling in bilateral hands. She has also developed left sided headache localized above the temporal area. She has a lot of pain at the base of the skull and occiput without clear radiation of symptoms. She is scheduled to see Dr. Rush for headache evaluation and management 3/28.   DMARD Hx AZA LEF HCQ SSZ MTX Simponi  Orencia  Kevzara    05/2017 RF 15 CCP 32 HBV neg  HCV neg   Review of Systems  Constitutional:  Positive for fatigue.  HENT:  Positive for mouth dryness. Negative for mouth sores.   Eyes:  Positive for dryness.  Respiratory:  Negative for shortness of breath.   Cardiovascular:  Positive for palpitations. Negative for chest pain.  Gastrointestinal:  Positive for constipation and diarrhea. Negative for blood in  stool.  Endocrine: Negative for increased urination.  Genitourinary:  Negative for involuntary urination.  Musculoskeletal:  Positive for joint pain, gait problem, joint pain, joint swelling, myalgias, muscle weakness, morning stiffness, muscle tenderness and myalgias.  Skin:  Positive for sensitivity to sunlight. Negative for color change, rash and hair loss.  Allergic/Immunologic: Positive for susceptible to infections.  Neurological:  Positive for dizziness and headaches.  Hematological:  Negative for swollen glands.  Psychiatric/Behavioral:  Negative for depressed mood and sleep disturbance. The patient is nervous/anxious.     PMFS History:  Patient Active Problem List   Diagnosis Date Noted   Hyponatremia 05/07/2024   Temporal arteritis syndrome (HCC) 05/07/2024    Class: Acute   Hypotension 05/07/2024   Chronic gastritis without bleeding 08/01/2023   Generalized anxiety disorder 08/01/2023   Redundant colon 08/01/2023   Mixed hyperlipidemia 08/01/2023   History of CVA (cerebrovascular accident) 11/14/2022   Insomnia due to medical condition 03/15/2022   Degeneration of intervertebral disc of cervical region 12/28/2021   Physical deconditioning 11/11/2021   Elevated serum lactate dehydrogenase (LDH) 08/17/2021   Vitamin D  deficiency 08/17/2021   Vitamin B12 deficiency 07/19/2021   Hardening of the aorta (main artery of the heart) 06/21/2021   Solitary lung nodule 06/15/2021   Protein-calorie malnutrition, severe 05/18/2021   Osteoporosis 06/15/2020   Iron  deficiency anemia 04/09/2020   Primary osteoarthritis  involving multiple joints 12/31/2018   Rheumatoid arthritis involving multiple sites with positive rheumatoid factor (HCC) 12/31/2018   Irritable bowel syndrome with both constipation and diarrhea 10/28/2018   Mitral valve prolapse 11/23/2015    Past Medical History:  Diagnosis Date   Anemia    Anxiety    Arthritis    RA   Bursitis    Cataract    CHF (congestive heart failure) (HCC)    Collagen vascular disease    Depression    Endometriosis    Heart murmur    High cholesterol    Hypertension    IBS (irritable bowel syndrome)    Lymphadenopathy    Mitral valve prolapse    Osteoporosis    RA (rheumatoid arthritis) (HCC)    Rotator cuff tear    bilateral   Stroke (HCC) 06/2022   Uterus, adenomyosis     Family History  Problem Relation Age of Onset   Bone cancer Mother    Brain cancer Father    Brain cancer Sister    Breast cancer Sister    Renal Disease Sister    Pancreatic cancer Neg Hx    Esophageal cancer Neg Hx    Stomach cancer Neg Hx    Rectal cancer Neg Hx    Colon polyps Neg Hx    Past Surgical History:  Procedure Laterality Date   ARTERY BIOPSY Left 05/08/2024   Procedure: LEFT TEMPORAL ARTERY BIOPSY;  Surgeon: Pearline Norman RAMAN, MD;  Location: [facility] OR;  Service: Vascular;  Laterality: Left;   AXILLARY LYMPH NODE BIOPSY Right 10/12/2020   Procedure: RIGHT AXILLARY LYMPH NODE BIOPSY EXCISION;  Surgeon: Aron Shoulders, MD;  Location: [facility];  Service: General;  Laterality: Right;   BUNIONECTOMY Right    CARPAL TUNNEL RELEASE Right    COLONOSCOPY     COLONOSCOPY N/A 12/13/2015   Procedure: COLONOSCOPY;  Surgeon: Claudis RAYMOND Rivet, MD;  Location: [facility];  Service: Endoscopy;  Laterality: N/A;  9:55   Fatty tumor     2011 (left arm)   TONSILLECTOMY     TOTAL ABDOMINAL HYSTERECTOMY  2003   Social History   Social History Narrative   Not on file   Immunization History  Administered Date(s) Administered   Influenza, Quadrivalent,  Recombinant, Inj, Pf 07/11/2017     Objective: Vital Signs: BP 128/80 (BP Location: Right Arm, Patient Position: Standing, Cuff Size: Small)   Pulse (!) 106   Temp (!) 97.4 F (36.3 C)   Resp 17   Ht 5' (1.524 m)   Wt 70 lb 12.8 oz (32.1 kg)   BMI 13.83 kg/m    Physical Exam Constitutional:      Comments: Underweight  Eyes:     Conjunctiva/sclera: Conjunctivae normal.  Cardiovascular:     Rate and Rhythm: Normal rate and regular rhythm.  Pulmonary:     Effort: Pulmonary effort is normal.     Breath sounds: Normal breath sounds.  Lymphadenopathy:     Cervical: No cervical adenopathy.  Skin:    General: Skin is warm and dry.     Findings: Bruising present. No rash.  Neurological:     Mental Status: She is alert.  Psychiatric:        Mood and Affect: Mood normal.      Musculoskeletal Exam:  Elbows full ROM no tenderness or swelling Wrists full ROM no tenderness or swelling Fingers chronic MCP joint thickening, right hand with reducible subluxation and lateral deviation, left hand worse incompletely reducible lateral deviation Diffuse neck and back midline and muscular pain with palpation Knees full ROM patellofemoral crepitus, no palpable effusion Right ankle tenderness to pressure   Investigation: No additional findings.  Imaging: No results found.  Recent Labs: Lab Results  Component Value Date   WBC 6.4 08/06/2024   HGB 12.3 08/06/2024   PLT 257 08/06/2024   NA 137 07/02/2024   K 3.3 (L) 07/02/2024   CL 102 07/02/2024   CO2 23 07/02/2024   GLUCOSE 92 07/02/2024   BUN 9 07/02/2024   CREATININE 0.61 07/02/2024   BILITOT <0.2 07/02/2024   ALKPHOS 75 07/02/2024   AST 28 07/02/2024   ALT 13 07/02/2024   PROT 6.0 (L) 07/02/2024   ALBUMIN 4.0 07/02/2024   CALCIUM  8.8 (L) 07/02/2024   GFRAA 92 11/06/2019   QFTBGOLDPLUS NEGATIVE 05/06/2024    Speciality Comments: Patient states they can't get one till next year   Procedures:  No procedures  performed Allergies: Abatacept , Codeine , Golimumab , Hydrocodone , Methylprednisolone , Methotrexate and trimetrexate, and Prednisone    Assessment / Plan:     Visit Diagnoses: Temporal arteritis syndrome (HCC) - Plan: Sedimentation rate, C-reactive protein Previous high sedimentation rate and C-reactive protein levels. Current inflammation markers need re-evaluation to assess disease activity. - Will check blood tests for inflammation markers. - Will consider additional vascular imaging if inflammation markers remain high and headaches persist.  Rheumatoid arthritis involving multiple sites with positive rheumatoid factor (HCC) - Plan: Sedimentation rate, C-reactive protein Chronic joint damage with significant pain, particularly in the thumb, causing functional limitations. Joint swelling reduced, indicating effective Actemra  management. Concerns about injections due to anatomical proximity of arteries and veins. - Continue Actemra  162 mg [place] weekly - Continue HCQ 200 mg 5 days weekly - Will check blood tests for disease activity markers.  Hyponatremia - Plan: Basic Metabolic Panel (BMET) Previous sodium level of 125, indicating dehydration. Sodium level corrected with saline infusion. Discussed importance of maintaining adequate sodium levels and risks of concentrated saline solutions. - Checked sodium levels as part of metabolic panel.  Migraine Current migraines less severe, pain localized. Potential link  to inflammation levels. - Will consider head angiography if inflammation markers remain high and headaches persist.        Orders: Orders Placed This Encounter  Procedures   Sedimentation rate   C-reactive protein   Basic Metabolic Panel (BMET)   No orders of the defined types were placed in this encounter.    Follow-Up Instructions: Return in about 3 months (around 11/07/2024) for GCA/RA on TOC f/u 3mos.   Lonni LELON Ester, MD  Note - This record has been created using  [employer].  Chart creation errors have been sought, but may not always  have been located. Such creation errors do not reflect on  the standard of medical care.

## 2024-08-08 LAB — SEDIMENTATION RATE: Sed Rate: 2 mm/h (ref 0–30)

## 2024-08-08 LAB — BASIC METABOLIC PANEL WITH GFR
BUN/Creatinine Ratio: 8 (calc) (ref 6–22)
BUN: 5 mg/dL — ABNORMAL LOW (ref 7–25)
CO2: 28 mmol/L (ref 20–32)
Calcium: 9.3 mg/dL (ref 8.6–10.4)
Chloride: 103 mmol/L (ref 98–110)
Creat: 0.61 mg/dL (ref 0.50–1.05)
Glucose, Bld: 62 mg/dL — ABNORMAL LOW (ref 65–99)
Potassium: 3.3 mmol/L — ABNORMAL LOW (ref 3.5–5.3)
Sodium: 139 mmol/L (ref 135–146)
eGFR: 98 mL/min/1.73m2 (ref >=60)

## 2024-08-08 LAB — C-REACTIVE PROTEIN: CRP: <3 mg/L (ref <8.0)

## 2024-08-08 NOTE — Telephone Encounter (Signed)
 Patient will be staying with SUBCUT Actemra  due to loss of full Medicaid subsidy. She has [employer] plan that may end up having Extra Help support in 2026 for rx drugs (and not medical care). Will be able to see this in 2026  Sherry Pennant, PharmD, MPH, BCPS, CPP Clinical Pharmacist [facility])

## 2024-08-08 NOTE — Telephone Encounter (Signed)
 Last Fill: 07/05/2024  Next Visit: 11/05/2024  Last Visit: 08/07/2024  Current Dose per telephone encounter on 07/05/2024: Sent new Rx for 50 mg hydroxyzine    Okay to refill hydroxyzine ?

## 2024-08-11 ENCOUNTER — Ambulatory Visit: Admitting: Sports Medicine

## 2024-08-12 ENCOUNTER — Other Ambulatory Visit: Payer: Self-pay

## 2024-08-12 ENCOUNTER — Telehealth: Payer: Self-pay

## 2024-08-12 NOTE — Telephone Encounter (Signed)
 Patient called the office stating that she is having migraine headaches and it feels like the one she was having two years ago. Patient states she is not sure if she is having a stroke or if its just from the medication.   Patient was advised that Dr Jeannetta is out of office for the day and if she feels as though the migraine is stroke related that she should go to the emergency room. She stated she did not want to go to the ER and have to sit for 6 hours that Dr Jeannetta could put an order in for her to be evaluated for a stroke.   Tried to advise patient that since he is out for the day the best response for this situation would be to go to the hospital to be safe.

## 2024-08-13 ENCOUNTER — Other Ambulatory Visit: Payer: Self-pay | Admitting: Internal Medicine

## 2024-08-13 ENCOUNTER — Other Ambulatory Visit: Payer: Self-pay

## 2024-08-13 ENCOUNTER — Ambulatory Visit: Payer: Self-pay | Admitting: Internal Medicine

## 2024-08-13 DIAGNOSIS — M0579 Rheumatoid arthritis with rheumatoid factor of multiple sites without organ or systems involvement: Secondary | ICD-10-CM

## 2024-08-13 NOTE — Progress Notes (Signed)
 Sed rate and CRP are both normal now indicating response to the Actemra . Her GCA should be low risk of any damage from inflammation with lab improvements. We could refer for angiogram imaging if headaches and visual symptoms persist but my suspicion would be very low of continued vasculitis activity.

## 2024-08-13 NOTE — Telephone Encounter (Signed)
 Last Fill: 05/14/2024  Eye exam: Patient states they can't get one till next year    Labs: 08/06/2024 CBC WNL 07/02/2024-CMP  Potassium 3.3 Calcium  8.8 Total Protein 6.0   Next Visit: 11/05/2024  Last Visit: 08/07/2024  DX: Temporal arteritis syndrome   Current Dose per office note 08/07/2024: HCQ 200 mg daily x5 days weekly   Okay to refill Plaquenil ?

## 2024-08-15 NOTE — Telephone Encounter (Signed)
 Patient called the office to see if the referral was placed, advise that it has not been yet but someone in the office would give her a call when it has been. Patient stats she would like it placed soon as possible as she is still having really bad headaches.

## 2024-08-18 ENCOUNTER — Ambulatory Visit: Admitting: Sports Medicine

## 2024-08-18 ENCOUNTER — Encounter: Payer: Self-pay | Admitting: Oncology

## 2024-08-18 NOTE — Telephone Encounter (Signed)
 Her hemoglobin is currently normal and has been on the medicine according to refill history. Hydroxychloroquine  does not have high risk for causing low hemoglobin.

## 2024-08-18 NOTE — Telephone Encounter (Signed)
 Contacted patient to advise that Dr Jeannetta confirmed the dose of 200 MG 5x weekly of the PLQ. Patient is still unsure as to why she is supposed to be taking the medication as she believes it is the medication that caused her hemoglobin to drop significantly low. Patient would like a call back about this concern.

## 2024-08-18 NOTE — Progress Notes (Signed)
 Charting error.

## 2024-08-18 NOTE — Telephone Encounter (Signed)
 Contacted patient to advise that Her hemoglobin is currently normal and has been on the medicine according to refill history. Hydroxychloroquine  does not have high risk for causing low hemoglobin.   Patient verbalized understanding and states she is confused about which medication caused issues with her hemoglobin.

## 2024-08-18 NOTE — Telephone Encounter (Signed)
 Patient contacted the office asking if she was supposed to be taking the PLQ, advised that per her last office visit she is supposed to be taking it as noted HCQ 200 mg 5 days weekly

## 2024-08-21 ENCOUNTER — Encounter: Payer: Self-pay | Admitting: Oncology

## 2024-08-21 ENCOUNTER — Ambulatory Visit: Admitting: Surgical

## 2024-08-21 ENCOUNTER — Other Ambulatory Visit: Payer: Self-pay

## 2024-08-21 DIAGNOSIS — M19031 Primary osteoarthritis, right wrist: Secondary | ICD-10-CM

## 2024-08-21 DIAGNOSIS — M7522 Bicipital tendinitis, left shoulder: Secondary | ICD-10-CM | POA: Diagnosis not present

## 2024-08-21 DIAGNOSIS — M7521 Bicipital tendinitis, right shoulder: Secondary | ICD-10-CM | POA: Diagnosis not present

## 2024-08-21 DIAGNOSIS — M170 Bilateral primary osteoarthritis of knee: Secondary | ICD-10-CM

## 2024-08-24 ENCOUNTER — Encounter: Payer: Self-pay | Admitting: Surgical

## 2024-08-24 MED ORDER — BUPIVACAINE HCL 0.25 % IJ SOLN
0.6600 mL | INTRAMUSCULAR | Status: AC | PRN
  Administered 2024-08-21: .66 mL via INTRA_ARTICULAR

## 2024-08-24 MED ORDER — TRIAMCINOLONE ACETONIDE 40 MG/ML IJ SUSP
40.0000 mg | INTRAMUSCULAR | Status: AC | PRN
  Administered 2024-08-21: 40 mg via INTRA_ARTICULAR

## 2024-08-24 MED ORDER — LIDOCAINE HCL 1 % IJ SOLN
5.0000 mL | INTRAMUSCULAR | Status: AC | PRN
  Administered 2024-08-21: 5 mL

## 2024-08-24 MED ORDER — METHYLPREDNISOLONE ACETATE 40 MG/ML IJ SUSP
13.3300 mg | INTRAMUSCULAR | Status: AC | PRN
  Administered 2024-08-21: 13.33 mg via INTRA_ARTICULAR

## 2024-08-24 MED ORDER — BUPIVACAINE HCL 0.25 % IJ SOLN
9.0000 mL | INTRAMUSCULAR | Status: AC | PRN
  Administered 2024-08-21: 9 mL via INTRA_ARTICULAR

## 2024-08-24 NOTE — Progress Notes (Signed)
   Procedure Note  Patient: Vanessa Lara             Date of Birth: 11-Mar-1956           MRN: [id number]             Visit Date: 08/21/2024  Procedures: Visit Diagnoses:  1. Biceps tendinitis of both shoulders   2. Primary osteoarthritis of both knees     Large Joint Inj: L glenohumeral on 08/21/2024 12:32 PM Indications: pain and diagnostic evaluation Details: 22 G 3.5 in needle, ultrasound-guided posterior approach Medications: 5 mL lidocaine  1 %; 9 mL bupivacaine  0.25 %; 40 mg triamcinolone  acetonide 40 MG/ML Outcome: tolerated well, no immediate complications Procedure, treatment alternatives, risks and benefits explained, specific risks discussed. Consent was given by the patient. Immediately prior to procedure a time out was called to verify the correct patient, procedure, equipment, support staff and site/side marked as required. Patient was prepped and draped in the usual sterile fashion.    Medium Joint Inj: R radiocarpal on 08/21/2024 12:33 PM Indications: pain and diagnostic evaluation Details: 22 G 1.5 in needle, ultrasound-guided dorsal approach Medications: 13.33 mg methylPREDNISolone  acetate 40 MG/ML; 0.66 mL bupivacaine  0.25 % Outcome: tolerated well, no immediate complications Procedure, treatment alternatives, risks and benefits explained, specific risks discussed. Consent was given by the patient. Immediately prior to procedure a time out was called to verify the correct patient, procedure, equipment, support staff and site/side marked as required. Patient was prepped and draped in the usual sterile fashion.    Tolerated both injections well.  She also complains of bilateral knee pain.  Gel injections have given her good relief in the past.  Plan to preapproved her for bilateral knee gel injections and follow-up after approval  This patient is diagnosed with osteoarthritis of the knee(s).    Radiographs show evidence of joint space narrowing, osteophytes,  subchondral sclerosis and/or subchondral cysts.  This patient has knee pain which interferes with functional and activities of daily living.    This patient has experienced inadequate response, adverse effects and/or intolerance with conservative treatments such as acetaminophen , NSAIDS, topical creams, physical therapy or regular exercise, knee bracing and/or weight loss.   This patient has experienced inadequate response or has a contraindication to intra articular steroid injections for at least 3 months.   This patient is not scheduled to have a total knee replacement within 6 months of starting treatment with viscosupplementation.

## 2024-08-25 ENCOUNTER — Telehealth: Payer: Self-pay | Admitting: Physician Assistant

## 2024-08-25 NOTE — Telephone Encounter (Signed)
 Late entry-I received a message from the on-call service on Saturday, November 22nd at 4:01PM-returned the patient's call right away to discuss the symptoms she was experiencing. Patient reported experiencing increased fatigue.  She was concerned she may be having side effects from plaquenil  use.  She was concerned because plaquenil  has previously caused anemia. CBC with diff was WNL on 08/06/24.  Recommended an evaluation in the ED if her symptoms persisted or worsened.   Patient requested to come to the office to see Dr. Jeannetta today on 08/25/24-I notified Dr. Jeannetta and notified our schedulers to reach out to schedule a visit.   All questions were addressed.  Waddell Craze, PA-C

## 2024-08-26 ENCOUNTER — Ambulatory Visit: Admitting: Sports Medicine

## 2024-08-26 ENCOUNTER — Telehealth: Payer: Self-pay | Admitting: Sports Medicine

## 2024-08-26 ENCOUNTER — Telehealth: Payer: Self-pay

## 2024-08-26 NOTE — Telephone Encounter (Signed)
 Patient called to notify us  that she had a high fever, chills, and other flu-like symptoms. Patient took Actemra  on Monday, 08/25/24. She reports poor control of her RA symptoms.  Per patient, she stopped Plaquenil  after 08/07/24 visit. Most recent prescription was picked up on 08/23/24. She does not want to continue taking or restart Plaquenil  due to history of worsening anemia and changes in hemoglobin while taking it.  Patient is interested in additional therapy to her Actemra , she does not want to take Plaquenil . Advised patient that follow-up with Dr. Jeannetta is needed to assess change in therapy, appointment scheduled 09/12/24 at 9 am. Counseled patient to hold Actemra  if sick.   Powell Gallus, PharmD, MPH Pharmacy Resident

## 2024-08-26 NOTE — Telephone Encounter (Signed)
 Pt called and rescheduled her appt today because she has the flu. She is rescheduled for Dec 8 at 3:00. She wanted me to let you know this.

## 2024-09-01 ENCOUNTER — Telehealth: Payer: Self-pay | Admitting: *Deleted

## 2024-09-01 NOTE — Telephone Encounter (Signed)
 Patient states she is having extreme fatigue. Patient states she would like to know if there is anything she can take to help with her fatigue.

## 2024-09-02 ENCOUNTER — Encounter: Attending: Gastroenterology | Admitting: Internal Medicine

## 2024-09-02 ENCOUNTER — Other Ambulatory Visit: Payer: Self-pay | Admitting: Internal Medicine

## 2024-09-02 ENCOUNTER — Other Ambulatory Visit: Payer: Self-pay

## 2024-09-02 DIAGNOSIS — M316 Other giant cell arteritis: Secondary | ICD-10-CM

## 2024-09-02 DIAGNOSIS — M0579 Rheumatoid arthritis with rheumatoid factor of multiple sites without organ or systems involvement: Secondary | ICD-10-CM

## 2024-09-02 DIAGNOSIS — Z79899 Other long term (current) drug therapy: Secondary | ICD-10-CM

## 2024-09-02 MED ORDER — ACTEMRA ACTPEN 162 MG/0.9ML ~~LOC~~ SOAJ
162.0000 mg | SUBCUTANEOUS | 2 refills | Status: AC
  Filled 2024-09-02: qty 3.6, 28d supply, fill #0
  Filled 2024-10-01 – 2024-10-10 (×3): qty 3.6, 28d supply, fill #1
  Filled 2024-11-04 – 2024-11-07 (×2): qty 3.6, 28d supply, fill #2

## 2024-09-02 NOTE — Progress Notes (Signed)
 Specialty Pharmacy Refill Coordination Note  Vanessa Lara is a 68 y.o. female contacted today regarding refills of specialty medication(s) Tocilizumab  (Actemra  ACTPen)   Patient requested Delivery   Delivery date: 09/12/24   Verified address: [address]  [place]    Medication will be filled on: 09/11/24  This fill date is pending response to refill request from provider. Patient is aware and if they have not received fill by intended date they must follow up with pharmacy.

## 2024-09-02 NOTE — Telephone Encounter (Signed)
 Last Fill: 05/22/2024  Labs: 07/02/2024 Potassium 3.3, Calcium  8.8, Total Protein 6.0, RBC 3.39, Hgb 10.3, Hct 32.2, RDW 17.0, Platelets 429  TB Gold: 05/06/2024   Next Visit: 09/02/2024  Last Visit: 08/07/2024  IK:Uzfenmjo arteritis syndrome   Current Dose per office note 08/07/2024: Actemra  162 mg [place] weekly   Okay to refill Actemra ?

## 2024-09-02 NOTE — Progress Notes (Deleted)
 Office Visit Note  Patient: Vanessa Lara             Date of Birth: 1956-03-23           MRN: [id number]             PCP: Suanne Pfeiffer, NP Referring: Suanne Pfeiffer, NP Visit Date: 09/02/2024   Subjective:  No chief complaint on file.   History of Present Illness: Vanessa Lara is a 68 y.o. female here for follow up ***   Previous HPI 08/07/24 Vanessa Lara is a 68 year old female with arthritis and giant cell arteritis and RA who presents for follow-up on Actemra  162 mg [place] weekly and HCQ 200 mg 5 days per week.   She is currently on Actemra  injections for her condition. Last month, she visited the orthopedics clinic and received injections in her knee and ankle for arthritis. She is scheduled for additional injections later this month.   She experiences significant pain in her thumb, describing it as 'the whole thumb's popping in and out,' with pain localized around a specific joint. This pain severely limits her daily activities.   Regarding her Actemra  treatment, she has not had her blood tests rechecked since starting the medication. Previous tests showed high sedimentation rate and C-reactive protein levels. She is also concerned about her sodium levels, which were previously low at 125 mmol/L, with accompanying low chloride, bicarbonate, and calcium  levels. She received saline treatment for this issue.   She reports experiencing migraines, noting that they are not as severe as before but still present. The pain is localized and does not extend around her hairline as it previously did.         Previous HPI 05/06/2024 Vanessa Lara is a 68 y.o. female here for follow up with rheumatoid arthritis on HCQ 200 mg daily x5 days weekly who presents with severe pain and medication intolerance. She presents with inadequate response to current treatment and concerns about medication side effects.   She has had multiple recent injections throughout orthopedics clinic, but these  only provide relief for about a month and a half. She is frustrated with the limited duration of relief and the restrictions on the frequency and dosage of these injections. She is seeking alternative treatments that do not involve narcotics.   She uses a muscle relaxer that causes significant cognitive impairment, limiting her ability to function during the day. To manage this, she takes small doses throughout the day, but this is not ideal. She is concerned about adding more medication due to her long-standing irritable bowel syndrome (IBS), which has been present for 53 years and has left her gastrointestinal system compromised.   She is interested in trying a different rheumatoid arthritis medication due to previous drugs either causing side effects or being ineffective. She recalls a past x-ray showing bone erosion and wants to prevent progression. Her last sedimentation rate was 58 in April.   She describes significant functional limitations due to her condition, particularly in her hands, which she describes as having difficulty with manipulation. She also experiences issues with her shoulders and back, where she received an injection two weeks ago that has since worn off. She notes muscle atrophy and difficulty maintaining foot position, which she attributes to overall muscle weakness.   Her diet is limited due to IBS, and she struggles to maintain adequate calorie intake. She is sevrely underweight and worsening, down to 62 lbs and BMI of 12. She  eats red meat, fish, chicken, green vegetables, and bread but avoids high-fat foods due to gastrointestinal distress.   She recalls a past incident of heart failure induced by medication, which caused significant water  weight gain without typical symptoms like swelling. She is cautious about medication side effects, particularly those affecting her heart.         Previous HPI 01/07/2024 Vanessa Lara is a 68 year old female with rheumatoid  arthritis on HCQ 200 mg daily x5 days weekly who presents with severe pain and medication intolerance.   She experiences severe pain due to arthritis, exacerbated by muscle spasms in her back. The pain is intense and significantly impacts her quality of life, leaving her feeling 'miserable' and without a 'happy place'. Flare-ups occur every other day or every third day, preventing her from performing daily activities.   She has a history of intolerance to several medications. Azathioprine  caused severe gastrointestinal issues, and she is currently on hydroxychloroquine  for arthritis. She cannot take codeine  due to gastrointestinal side effects related to her IBS. Muscle relaxants like Flexeril  have been ineffective in managing her symptoms.   A recent bone marrow study was performed without anesthesia due to low blood pressure, which she found distressing. She experiences dizziness throughout the day, which she suspects may be related to anemia or other underlying issues. She has gained a small amount of weight recently, about four ounces.   She has been unable to follow up with an orthopedist due to her condition flaring up, making it difficult for her to travel. She also struggles with arranging transportation as her plans often fall through at the last minute.      Previous HPI 11/19/23 Vanessa Lara is a 67 year old female with rheumatoid arthritis on hydroxychloroquine  200 mg once daily for 5 days per week.   She experiences significant pain and stiffness, particularly on Sundays, leading her to take an additional half dose of her medication on Sundays for the past two weekends. She describes waking up with severe stiffness and being unable to move by Monday if she does not take the extra dose. She is seeking an alternative medication to manage her symptoms more effectively.   She describes pain associated with bursitis and possible rotator cuff issues, noting a pressure point that radiates  from her shoulder down towards her armpit and into her upper arm, forming a 'V' shape. She is scheduled to receive an injection for this pain.   She reports issues with her foot, including a previously twisted or sprained ankle that healed but then became painful and red, particularly around a bone that feels bruised. She also mentions a rod in her big toe that has shifted, causing pain and misalignment of her toes. She was previously told that surgery could correct her toes, but now her arthritis and varicose veins have worsened, raising concerns about circulation, infection, and potential amputation. Her bone density is very low, complicating surgical options.   She mentions her knuckles are sore, with one being particularly bad, and she experiences pain that radiates down her ear. She also notes fluctuations in her weight, questioning whether it is due to scale differences or her condition.      Previous HPI 07/04/23 Vanessa Lara is a 68 y.o. female here for follow up for seropositive RA and osteoarthritis and osteoporosis she stopped hydroxychloroquine  after last visit due to concern about cardiac arrhythmia alongside Paxil  and duloxetine .  She was only able to take a low-dose  of the medication she did feel it improved her overall condition but still had persistent arthritis of numerous areas regardless.  She had an updated bone density scan on July 15 still with osteoporosis of the lumbar spine although better than 2022 and with femur osteopenia similar to the previous test.  She is taking Venofer  for iron  deficiency anemia this ongoing to correct it with her goal of enough to tolerate foot surgery.  She underwent endoscopy with no upper or lower GI findings to account for heme positive stool. Acute problem today increased right ankle pain and swelling started since 3 weeks ago.  She did not recall any fall trip or other trauma to that area.  Initially there was a high degree of swelling he put a  wrap around this and can see prominent indentation the swelling has largely decreased compared to the outset but still has pain with direct pressure and with weightbearing.   Imaging reviewed DEXA Review [facility] Spine L1-L2 04/16/23  -3.8    0.712 g/cm2 04/12/21  -4.5    0.627 g/cm2 04/13/20  -4.2    0.665 g/cm2   DualFemur Total Mean 04/16/23  -2.6    0.680 g/cm2 04/12/21  -2.6    0.676 g/cm2 04/13/20  -2.2    0.736 g/cm2     03/08/23 Vanessa Lara is a 68 y.o. female here for follow up for seropositive RA.  Currently off any disease specific medication she cannot tolerate taking the sulfasalazine  due to severe stomach irritation described as burning up her stomach.  Continues having joint pain and stiffness on a daily basis overall feels her function is very poor due to the severity.  Had labs last month still anemic at hemoglobin of 9.8 being treated with infusion for iron  deficiency anemia.   01/16/23 Vanessa Lara is a 68 y.o. female here for follow up for seropositive RA.  Currently health maintenance treatment after discontinuing 5 mg prednisone  is on medication for joint pain for RA along with osteoarthritis and chronic pain syndrome.  Had recent knee steroid injection since her last visit but reports overall widespread symptoms are severe.  Particularly is been noticing some increased trouble with pain and some swelling involving the wrist worse on left side.   10/03/22 Vanessa Lara is a 68 y.o. female here for follow up for seropositive RA currently off long term DMARD with prednisone  5 mg daily as needed.  She is overall not doing well feels like pain and stiffness is a big problem but more severe is her fatigue and feels a generalized getting weaker with difficulty moving and with her upper body strength.  She pretty consistently has to take the prednisone  for otherwise does not move around much during the day.  Continues to get a benefit with the Flexeril  at nighttime with slight decrease in  frequency of awakenings.  She is also concerned about getting an update to her bone density testing ongoing oral alendronate  treatment.  She is also still having a lot of pain and difficulty with walking from her claw toe deformities on the foot and planning to follow-up with surgery clinic about management of this.   06/28/22 Vanessa Lara is a 68 y.o. female here for follow up for seropositive RA now off of treatment after stopping the Kevzara .  Since she stopped taking the medication her complaint of night sweats and fatigue have improved. She has seen a worsening of joint pain in several areas particularly around the MCP joints  of both hands. For knee pain she saw orthopedic surgery clinic yesterday and had injections on both sides.  She also discussed getting set up to do right SI joint steroid injection.  Biggest complaint today is back pain on the right side around the middle of the back and around the scapula.  This bothers her especially with certain movements and trying to bend side to side.    05/18/2022  Vanessa Lara is a 68 y.o. female here for follow up for seropositive rheumatoid arthritis on Kevzara  150 mg [place] q14days. She feels arthritis has been fairly well controlled but has pain worse in her left foot mostly. She had GI issues suspected UTI but with negative culture since last visit. She feels a sensation like her brain is numb. Also has ongoing upper neck pain without radiating symptoms.    02/07/2022 Vanessa Lara is a 68 y.o. female here for follow up for rheumatoid arthritis with chronic joint pain in multiple areas and chronic fatigue labs showing elevated rheumatoid factor. Kevzara  new start on 12/15/2021. She felt an improvement when taking the injections within first weeks. Accidentally took the shots twice in a week due to forgetfulness or mistake but did not notice any major problems. She called in on account of increased joint pain but felt no improvement with the prescribed  medrol  dose pack. She did feel more lightheadedness or off balance feeling while taking this so stopped after about 3 days. Currently back pain and her left foot pain are worst issues.   Previous HPI 12/05/2021 Vanessa Lara TIZNADO is a 68 y.o. female here for evaluation of rheumatoid arthritis with chronic joint pain in multiple areas and chronic fatigue labs showing elevated rheumatoid factor. She has history of mitral valve prolapse, severe osteoporosis, osteoarthritis, and severe weight loss and deconditioning. She was diagnosed with seropositive RA in 2018 with Dr. Curt and most recently saw Dr. Mai in 11/2020. At that time recommended to start hydroxychloroquine  for inflammatory arthritis symptoms. She previously tried methotrexate, simponi  aria, and orencia  with poor tolerance. Imaging consistent with degenerative arthritis in cervical spine and knee but also with some partial tendon tear in shoulder and some synovitis present in knee on MRI imaging. She has taken multiple medications for pain including NSAIDs, prednisone , gabapentin , tramadol , and hydrocodone  or oxycodone . She has a lot of pain in multiple areas but is also severely fatigued. She has significant weight loss down to 70 lbs unintentionally reports chronic IBS issues but no recent changes during this period of weight loss. Joint pain also limits her activity quite a bit with chronic deformities and also ongoing swelling in bilateral hands. She has also developed left sided headache localized above the temporal area. She has a lot of pain at the base of the skull and occiput without clear radiation of symptoms. She is scheduled to see Dr. Rush for headache evaluation and management 3/28.   DMARD Hx AZA LEF HCQ SSZ MTX Simponi  Orencia  Kevzara    05/2017 RF 15 CCP 32 HBV neg HCV neg   No Rheumatology ROS completed.   PMFS History:  Patient Active Problem List   Diagnosis Date Noted   Hyponatremia 05/07/2024   Temporal  arteritis syndrome (HCC) 05/07/2024    Class: Acute   Hypotension 05/07/2024   Chronic gastritis without bleeding 08/01/2023   Generalized anxiety disorder 08/01/2023   Redundant colon 08/01/2023   Mixed hyperlipidemia 08/01/2023   History of CVA (cerebrovascular accident) 11/14/2022   Insomnia due to medical condition  03/15/2022   Degeneration of intervertebral disc of cervical region 12/28/2021   Physical deconditioning 11/11/2021   Elevated serum lactate dehydrogenase (LDH) 08/17/2021   Vitamin D  deficiency 08/17/2021   Vitamin B12 deficiency 07/19/2021   Hardening of the aorta (main artery of the heart) 06/21/2021   Solitary lung nodule 06/15/2021   Protein-calorie malnutrition, severe 05/18/2021   Osteoporosis 06/15/2020   Iron  deficiency anemia 04/09/2020   Primary osteoarthritis involving multiple joints 12/31/2018   Rheumatoid arthritis involving multiple sites with positive rheumatoid factor (HCC) 12/31/2018   Irritable bowel syndrome with both constipation and diarrhea 10/28/2018   Mitral valve prolapse 11/23/2015    Past Medical History:  Diagnosis Date   Anemia    Anxiety    Arthritis    RA   Bursitis    Cataract    CHF (congestive heart failure) (HCC)    Collagen vascular disease    Depression    Endometriosis    Heart murmur    High cholesterol    Hypertension    IBS (irritable bowel syndrome)    Lymphadenopathy    Mitral valve prolapse    Osteoporosis    RA (rheumatoid arthritis) (HCC)    Rotator cuff tear    bilateral   Stroke (HCC) 06/2022   Uterus, adenomyosis     Family History  Problem Relation Age of Onset   Bone cancer Mother    Brain cancer Father    Brain cancer Sister    Breast cancer Sister    Renal Disease Sister    Pancreatic cancer Neg Hx    Esophageal cancer Neg Hx    Stomach cancer Neg Hx    Rectal cancer Neg Hx    Colon polyps Neg Hx    Past Surgical History:  Procedure Laterality Date   ARTERY BIOPSY Left 05/08/2024    Procedure: LEFT TEMPORAL ARTERY BIOPSY;  Surgeon: Pearline Norman RAMAN, MD;  Location[facility];  Service: Vascular;  Laterality: Left;   AXILLARY LYMPH NODE BIOPSY Right 10/12/2020   Procedure: RIGHT AXILLARY LYMPH NODE BIOPSY EXCISION;  Surgeon: Aron Shoulders, MD;  Location: [facility];  Service: General;  Laterality: Right;   BUNIONECTOMY Right    CARPAL TUNNEL RELEASE Right    COLONOSCOPY     COLONOSCOPY N/A 12/13/2015   Procedure: COLONOSCOPY;  Surgeon: Claudis RAYMOND Rivet, MD;  Location: [facility];  Service: Endoscopy;  Laterality: N/A;  9:55   Fatty tumor     2011 (left arm)   TONSILLECTOMY     TOTAL ABDOMINAL HYSTERECTOMY     2003   Social History   Social History Narrative   Not on file   Immunization History  Administered Date(s) Administered   Influenza, Quadrivalent, Recombinant, Inj, Pf 07/11/2017     Objective: Vital Signs: There were no vitals taken for this visit.   Physical Exam   Musculoskeletal Exam: ***  CDAI Exam: CDAI Score: -- Patient Global: --; Provider Global: -- Swollen: --; Tender: -- Joint Exam 09/02/2024   No joint exam has been documented for this visit   There is currently no information documented on the homunculus. Go to the Rheumatology activity and complete the homunculus joint exam.  Investigation: No additional findings.  Imaging: US  Guided Needle Placement - No Linked Charges Result Date: 08/24/2024 Ultrasound imaging demonstrates needle placement into the glenohumeral joint with injection of fluid and no complication. Ultrasound imaging demonstrates needle placement into the right wrist joint with injection of fluid no complication.  Recent Labs: Lab Results  Component Value Date   WBC 6.4 08/06/2024   HGB 12.3 08/06/2024   PLT 257 08/06/2024   NA 139 08/07/2024   K 3.3 (L) 08/07/2024   CL 103 08/07/2024   CO2 28 08/07/2024   GLUCOSE 62 (L) 08/07/2024   BUN 5 (L) 08/07/2024   CREATININE 0.61 08/07/2024    BILITOT <0.2 07/02/2024   ALKPHOS 75 07/02/2024   AST 28 07/02/2024   ALT 13 07/02/2024   PROT 6.0 (L) 07/02/2024   ALBUMIN 4.0 07/02/2024   CALCIUM  9.3 08/07/2024   GFRAA 92 11/06/2019   QFTBGOLDPLUS NEGATIVE 05/06/2024    Speciality Comments: Patient states they can't get one till next year   Procedures:  No procedures performed Allergies: Abatacept , Codeine , Golimumab , Hydrocodone , Methylprednisolone , Methotrexate and trimetrexate, and Prednisone    Assessment / Plan:     Visit Diagnoses: No diagnosis found.  ***  Orders: No orders of the defined types were placed in this encounter.  No orders of the defined types were placed in this encounter.    Follow-Up Instructions: No follow-ups on file.   Lonni LELON Ester, MD  Note - This record has been created using [employer].  Chart creation errors have been sought, but may not always  have been located. Such creation errors do not reflect on  the standard of medical care.

## 2024-09-03 ENCOUNTER — Telehealth: Payer: Self-pay | Admitting: Surgical

## 2024-09-03 ENCOUNTER — Other Ambulatory Visit: Payer: Self-pay

## 2024-09-03 NOTE — Telephone Encounter (Signed)
 Patient called and said she is hurting bad and wants to know if she getting the shot in both arms. She ask if you could call her back to let her know please. RA#[phone]

## 2024-09-03 NOTE — Telephone Encounter (Signed)
 Patient called and wanted to know if she was approved or not. RA#[phone]

## 2024-09-03 NOTE — Telephone Encounter (Signed)
 Talked with patient an appt.has been scheduled

## 2024-09-04 ENCOUNTER — Inpatient Hospital Stay

## 2024-09-08 ENCOUNTER — Ambulatory Visit: Admitting: Sports Medicine

## 2024-09-08 ENCOUNTER — Telehealth: Payer: Self-pay

## 2024-09-08 NOTE — Telephone Encounter (Signed)
 Patient left a message requesting to change her upcoming appointment to a virtual appointment. Please advise. Thanks!

## 2024-09-08 NOTE — Progress Notes (Deleted)
 "  Office Visit Note  Patient: Vanessa Lara             Date of Birth: 1956/06/03           MRN: [id number]             PCP: Suanne Pfeiffer, NP Referring: Suanne Pfeiffer, NP Visit Date: 09/12/2024   Subjective:  Medical Management of Chronic Issues (Patient states she has seven thing to talk about. )   Telephone visit *** Vanessa Lara is a 68 y.o. female here for follow up of her rheumatoid arthritis and giant cell arteritis on Actemra  162 mg [place] weekly. She is very stressed by medical issues and reports feeling worse every day. Fatigue limiting her activity severely within a few hours after waking. Headaches are now affecting her right side similar as the left side, and resumed on her migraine medications. She was prescribed decadron  but is off this due to stomach intolerance. She was off Actemra  due to infection but has resumed this for 2 weeks, 3 total doses.  Nose is very raw and sometimes bleeding. Notices cracking and bleeding after any manipulation. Reports trying 'everything' describing keeping area very clean and has applied barrier ointment or cream.  She had injections in the right wrist and left shoulder 11/20 with orthopedics. She did not follow up since then and is concerned about getting out in the cold or getting sick.  She complains of neuropathy painful and severely cold discomfort in hands and feet worse now on account of cold temperatures.  She is tearful and reports high level of anxiety about her healthcare and 'trapped in her own home.'  CoQ-10 supplement question, 200 mcg daily interested for energy and her inflammation.    Previous HPI 08/07/2024 Vanessa Lara is a 68 year old female with arthritis and giant cell arteritis and RA who presents for follow-up on Actemra  162 mg [place] weekly and HCQ 200 mg 5 days per week.   She is currently on Actemra  injections for her condition. Last month, she visited the orthopedics clinic and received injections in her  knee and ankle for arthritis. She is scheduled for additional injections later this month.   She experiences significant pain in her thumb, describing it as 'the whole thumb's popping in and out,' with pain localized around a specific joint. This pain severely limits her daily activities.   Regarding her Actemra  treatment, she has not had her blood tests rechecked since starting the medication. Previous tests showed high sedimentation rate and C-reactive protein levels. She is also concerned about her sodium levels, which were previously low at 125 mmol/L, with accompanying low chloride, bicarbonate, and calcium  levels. She received saline treatment for this issue.   She reports experiencing migraines, noting that they are not as severe as before but still present. The pain is localized and does not extend around her hairline as it previously did.         Previous HPI 05/06/2024 Vanessa Lara is a 68 y.o. female here for follow up with rheumatoid arthritis on HCQ 200 mg daily x5 days weekly who presents with severe pain and medication intolerance. She presents with inadequate response to current treatment and concerns about medication side effects.   She has had multiple recent injections throughout orthopedics clinic, but these only provide relief for about a month and a half. She is frustrated with the limited duration of relief and the restrictions on the frequency and dosage of these injections.  She is seeking alternative treatments that do not involve narcotics.   She uses a muscle relaxer that causes significant cognitive impairment, limiting her ability to function during the day. To manage this, she takes small doses throughout the day, but this is not ideal. She is concerned about adding more medication due to her long-standing irritable bowel syndrome (IBS), which has been present for 53 years and has left her gastrointestinal system compromised.   She is interested in trying a different  rheumatoid arthritis medication due to previous drugs either causing side effects or being ineffective. She recalls a past x-ray showing bone erosion and wants to prevent progression. Her last sedimentation rate was 58 in April.   She describes significant functional limitations due to her condition, particularly in her hands, which she describes as having difficulty with manipulation. She also experiences issues with her shoulders and back, where she received an injection two weeks ago that has since worn off. She notes muscle atrophy and difficulty maintaining foot position, which she attributes to overall muscle weakness.   Her diet is limited due to IBS, and she struggles to maintain adequate calorie intake. She is sevrely underweight and worsening, down to 62 lbs and BMI of 12. She eats red meat, fish, chicken, green vegetables, and bread but avoids high-fat foods due to gastrointestinal distress.   She recalls a past incident of heart failure induced by medication, which caused significant water  weight gain without typical symptoms like swelling. She is cautious about medication side effects, particularly those affecting her heart.         Previous HPI 01/07/2024 Vanessa Lara is a 68 year old female with rheumatoid arthritis on HCQ 200 mg daily x5 days weekly who presents with severe pain and medication intolerance.   She experiences severe pain due to arthritis, exacerbated by muscle spasms in her back. The pain is intense and significantly impacts her quality of life, leaving her feeling 'miserable' and without a 'happy place'. Flare-ups occur every other day or every third day, preventing her from performing daily activities.   She has a history of intolerance to several medications. Azathioprine  caused severe gastrointestinal issues, and she is currently on hydroxychloroquine  for arthritis. She cannot take codeine  due to gastrointestinal side effects related to her IBS. Muscle relaxants  like Flexeril  have been ineffective in managing her symptoms.   A recent bone marrow study was performed without anesthesia due to low blood pressure, which she found distressing. She experiences dizziness throughout the day, which she suspects may be related to anemia or other underlying issues. She has gained a small amount of weight recently, about four ounces.   She has been unable to follow up with an orthopedist due to her condition flaring up, making it difficult for her to travel. She also struggles with arranging transportation as her plans often fall through at the last minute.      Previous HPI 11/19/23 Vanessa Lara is a 68 year old female with rheumatoid arthritis on hydroxychloroquine  200 mg once daily for 5 days per week.   She experiences significant pain and stiffness, particularly on Sundays, leading her to take an additional half dose of her medication on Sundays for the past two weekends. She describes waking up with severe stiffness and being unable to move by Monday if she does not take the extra dose. She is seeking an alternative medication to manage her symptoms more effectively.   She describes pain associated with bursitis and possible rotator  cuff issues, noting a pressure point that radiates from her shoulder down towards her armpit and into her upper arm, forming a 'V' shape. She is scheduled to receive an injection for this pain.   She reports issues with her foot, including a previously twisted or sprained ankle that healed but then became painful and red, particularly around a bone that feels bruised. She also mentions a rod in her big toe that has shifted, causing pain and misalignment of her toes. She was previously told that surgery could correct her toes, but now her arthritis and varicose veins have worsened, raising concerns about circulation, infection, and potential amputation. Her bone density is very low, complicating surgical options.   She mentions her  knuckles are sore, with one being particularly bad, and she experiences pain that radiates down her ear. She also notes fluctuations in her weight, questioning whether it is due to scale differences or her condition.      Previous HPI 07/04/23 Vanessa Lara is a 68 y.o. female here for follow up for seropositive RA and osteoarthritis and osteoporosis she stopped hydroxychloroquine  after last visit due to concern about cardiac arrhythmia alongside Paxil  and duloxetine .  She was only able to take a low-dose of the medication she did feel it improved her overall condition but still had persistent arthritis of numerous areas regardless.  She had an updated bone density scan on July 15 still with osteoporosis of the lumbar spine although better than 2022 and with femur osteopenia similar to the previous test.  She is taking Venofer  for iron  deficiency anemia this ongoing to correct it with her goal of enough to tolerate foot surgery.  She underwent endoscopy with no upper or lower GI findings to account for heme positive stool. Acute problem today increased right ankle pain and swelling started since 3 weeks ago.  She did not recall any fall trip or other trauma to that area.  Initially there was a high degree of swelling he put a wrap around this and can see prominent indentation the swelling has largely decreased compared to the outset but still has pain with direct pressure and with weightbearing.   Imaging reviewed DEXA Review AP Spine L1-L2 04/16/23  -3.8    0.712 g/cm2 04/12/21  -4.5    0.627 g/cm2 04/13/20  -4.2    0.665 g/cm2   DualFemur Total Mean 04/16/23  -2.6    0.680 g/cm2 04/12/21  -2.6    0.676 g/cm2 04/13/20  -2.2    0.736 g/cm2     03/08/23 Vanessa Lara is a 68 y.o. female here for follow up for seropositive RA.  Currently off any disease specific medication she cannot tolerate taking the sulfasalazine  due to severe stomach irritation described as burning up her stomach.  Continues having  joint pain and stiffness on a daily basis overall feels her function is very poor due to the severity.  Had labs last month still anemic at hemoglobin of 9.8 being treated with infusion for iron  deficiency anemia.   01/16/23 Vanessa Lara is a 68 y.o. female here for follow up for seropositive RA.  Currently health maintenance treatment after discontinuing 5 mg prednisone  is on medication for joint pain for RA along with osteoarthritis and chronic pain syndrome.  Had recent knee steroid injection since her last visit but reports overall widespread symptoms are severe.  Particularly is been noticing some increased trouble with pain and some swelling involving the wrist worse on left side.  10/03/22 Vanessa Lara is a 68 y.o. female here for follow up for seropositive RA currently off long term DMARD with prednisone  5 mg daily as needed.  She is overall not doing well feels like pain and stiffness is a big problem but more severe is her fatigue and feels a generalized getting weaker with difficulty moving and with her upper body strength.  She pretty consistently has to take the prednisone  for otherwise does not move around much during the day.  Continues to get a benefit with the Flexeril  at nighttime with slight decrease in frequency of awakenings.  She is also concerned about getting an update to her bone density testing ongoing oral alendronate  treatment.  She is also still having a lot of pain and difficulty with walking from her claw toe deformities on the foot and planning to follow-up with surgery clinic about management of this.   06/28/22 Vanessa Lara is a 68 y.o. female here for follow up for seropositive RA now off of treatment after stopping the Kevzara .  Since she stopped taking the medication her complaint of night sweats and fatigue have improved. She has seen a worsening of joint pain in several areas particularly around the MCP joints of both hands. For knee pain she saw orthopedic surgery  clinic yesterday and had injections on both sides.  She also discussed getting set up to do right SI joint steroid injection.  Biggest complaint today is back pain on the right side around the middle of the back and around the scapula.  This bothers her especially with certain movements and trying to bend side to side.    05/18/2022  Vanessa Lara is a 68 y.o. female here for follow up for seropositive rheumatoid arthritis on Kevzara  150 mg [place] q14days. She feels arthritis has been fairly well controlled but has pain worse in her left foot mostly. She had GI issues suspected UTI but with negative culture since last visit. She feels a sensation like her brain is numb. Also has ongoing upper neck pain without radiating symptoms.    02/07/2022 Vanessa Lara is a 69 y.o. female here for follow up for rheumatoid arthritis with chronic joint pain in multiple areas and chronic fatigue labs showing elevated rheumatoid factor. Kevzara  new start on 12/15/2021. She felt an improvement when taking the injections within first weeks. Accidentally took the shots twice in a week due to forgetfulness or mistake but did not notice any major problems. She called in on account of increased joint pain but felt no improvement with the prescribed medrol  dose pack. She did feel more lightheadedness or off balance feeling while taking this so stopped after about 3 days. Currently back pain and her left foot pain are worst issues.   Previous HPI 12/05/2021 Vanessa Lara is a 68 y.o. female here for evaluation of rheumatoid arthritis with chronic joint pain in multiple areas and chronic fatigue labs showing elevated rheumatoid factor. She has history of mitral valve prolapse, severe osteoporosis, osteoarthritis, and severe weight loss and deconditioning. She was diagnosed with seropositive RA in 2018 with Dr. Curt and most recently saw Dr. Mai in 11/2020. At that time recommended to start hydroxychloroquine  for inflammatory  arthritis symptoms. She previously tried methotrexate, simponi  aria, and orencia  with poor tolerance. Imaging consistent with degenerative arthritis in cervical spine and knee but also with some partial tendon tear in shoulder and some synovitis present in knee on MRI imaging. She has taken multiple medications for pain  including NSAIDs, prednisone , gabapentin , tramadol , and hydrocodone  or oxycodone . She has a lot of pain in multiple areas but is also severely fatigued. She has significant weight loss down to 70 lbs unintentionally reports chronic IBS issues but no recent changes during this period of weight loss. Joint pain also limits her activity quite a bit with chronic deformities and also ongoing swelling in bilateral hands. She has also developed left sided headache localized above the temporal area. She has a lot of pain at the base of the skull and occiput without clear radiation of symptoms. She is scheduled to see Dr. Rush for headache evaluation and management 3/28.   DMARD Hx Actemra  AZA LEF HCQ SSZ MTX Simponi  Orencia  Kevzara    05/2017 RF 15 CCP 32 HBV neg HCV neg   Review of Systems  Constitutional:  Positive for fatigue.  HENT:  Positive for mouth dryness. Negative for mouth sores.   Eyes:  Positive for dryness.  Respiratory:  Positive for shortness of breath.   Cardiovascular:  Positive for palpitations. Negative for chest pain.  Gastrointestinal:  Positive for constipation and diarrhea. Negative for blood in stool.  Endocrine: Negative for increased urination.  Genitourinary:  Negative for involuntary urination.  Musculoskeletal:  Positive for joint pain, joint pain, joint swelling, myalgias, muscle weakness, morning stiffness, muscle tenderness and myalgias. Negative for gait problem.  Skin:  Positive for sensitivity to sunlight. Negative for color change, rash and hair loss.  Allergic/Immunologic: Positive for susceptible to infections.  Neurological:  Positive  for headaches. Negative for dizziness.  Hematological:  Negative for swollen glands.  Psychiatric/Behavioral:  Positive for depressed mood. Negative for sleep disturbance. The patient is nervous/anxious.     PMFS History:  Patient Active Problem List   Diagnosis Date Noted   Hyponatremia 05/07/2024   Temporal arteritis syndrome (HCC) 05/07/2024    Class: Acute   Hypotension 05/07/2024   Chronic gastritis without bleeding 08/01/2023   Generalized anxiety disorder 08/01/2023   Redundant colon 08/01/2023   Mixed hyperlipidemia 08/01/2023   History of CVA (cerebrovascular accident) 11/14/2022   Insomnia due to medical condition 03/15/2022   Degeneration of intervertebral disc of cervical region 12/28/2021   Physical deconditioning 11/11/2021   Elevated serum lactate dehydrogenase (LDH) 08/17/2021   Vitamin D  deficiency 08/17/2021   Vitamin B12 deficiency 07/19/2021   Hardening of the aorta (main artery of the heart) 06/21/2021   Solitary lung nodule 06/15/2021   Protein-calorie malnutrition, severe 05/18/2021   Osteoporosis 06/15/2020   Iron  deficiency anemia 04/09/2020   Primary osteoarthritis involving multiple joints 12/31/2018   Rheumatoid arthritis involving multiple sites with positive rheumatoid factor (HCC) 12/31/2018   Irritable bowel syndrome with both constipation and diarrhea 10/28/2018   Mitral valve prolapse 11/23/2015    Past Medical History:  Diagnosis Date   Anemia    Anxiety    Arthritis    RA   Bursitis    Cataract    CHF (congestive heart failure) (HCC)    Collagen vascular disease    Depression    Endometriosis    Heart murmur    High cholesterol    Hypertension    IBS (irritable bowel syndrome)    Lymphadenopathy    Mitral valve prolapse    Osteoporosis    RA (rheumatoid arthritis) (HCC)    Rotator cuff tear    bilateral   Stroke (HCC) 06/2022   Uterus, adenomyosis     Family History  Problem Relation Age of Onset   Bone cancer Mother  Brain cancer Father    Brain cancer Sister    Breast cancer Sister    Renal Disease Sister    Pancreatic cancer Neg Hx    Esophageal cancer Neg Hx    Stomach cancer Neg Hx    Rectal cancer Neg Hx    Colon polyps Neg Hx    Past Surgical History:  Procedure Laterality Date   ARTERY BIOPSY Left 05/08/2024   Procedure: LEFT TEMPORAL ARTERY BIOPSY;  Surgeon: Pearline Norman RAMAN, MD;  Location: [facility] OR;  Service: Vascular;  Laterality: Left;   AXILLARY LYMPH NODE BIOPSY Right 10/12/2020   Procedure: RIGHT AXILLARY LYMPH NODE BIOPSY EXCISION;  Surgeon: Aron Shoulders, MD;  Location: [facility];  Service: General;  Laterality: Right;   BUNIONECTOMY Right    CARPAL TUNNEL RELEASE Right    COLONOSCOPY     COLONOSCOPY N/A 12/13/2015   Procedure: COLONOSCOPY;  Surgeon: Claudis RAYMOND Rivet, MD;  Location: [facility];  Service: Endoscopy;  Laterality: N/A;  9:55   Fatty tumor     2011 (left arm)   TONSILLECTOMY     TOTAL ABDOMINAL HYSTERECTOMY     2003   Social History   Social History Narrative   Not on file   Immunization History  Administered Date(s) Administered   Influenza, Quadrivalent, Recombinant, Inj, Pf 07/11/2017     Objective: Vital Signs: Resp 17   Ht 5' (1.524 m)   Wt 65 lb (29.5 kg) Comment: provided by patient  BMI 12.69 kg/m    Physical Exam   Musculoskeletal Exam: ***  CDAI Exam: CDAI Score: -- Patient Global: --; Provider Global: -- Swollen: --; Tender: -- Joint Exam 09/12/2024   No joint exam has been documented for this visit   There is currently no information documented on the homunculus. Go to the Rheumatology activity and complete the homunculus joint exam.  Investigation: No additional findings.  Imaging: US  Guided Needle Placement - No Linked Charges Result Date: 08/24/2024 Ultrasound imaging demonstrates needle placement into the glenohumeral joint with injection of fluid and no complication. Ultrasound imaging demonstrates needle  placement into the right wrist joint with injection of fluid no complication.   Recent Labs: Lab Results  Component Value Date   WBC 6.4 08/06/2024   HGB 12.3 08/06/2024   PLT 257 08/06/2024   NA 139 08/07/2024   K 3.3 (L) 08/07/2024   CL 103 08/07/2024   CO2 28 08/07/2024   GLUCOSE 62 (L) 08/07/2024   BUN 5 (L) 08/07/2024   CREATININE 0.61 08/07/2024   BILITOT <0.2 07/02/2024   ALKPHOS 75 07/02/2024   AST 28 07/02/2024   ALT 13 07/02/2024   PROT 6.0 (L) 07/02/2024   ALBUMIN 4.0 07/02/2024   CALCIUM  9.3 08/07/2024   GFRAA 92 11/06/2019   QFTBGOLDPLUS NEGATIVE 05/06/2024    Speciality Comments: Patient states they can't get one till next year   Procedures:  No procedures performed Allergies: Abatacept , Codeine , Golimumab , Hydrocodone , Methylprednisolone , Naratriptan , Methotrexate and trimetrexate, and Prednisone    Assessment / Plan:     Visit Diagnoses: Temporal arteritis syndrome (HCC) - Plan: methylPREDNISolone  (MEDROL ) 4 MG tablet  Rheumatoid arthritis involving multiple sites with positive rheumatoid factor (HCC) - Plan: methylPREDNISolone  (MEDROL ) 4 MG tablet  Primary osteoarthritis involving multiple joints  Age-related osteoporosis without current pathological fracture  Hyponatremia  ***  Orders: No orders of the defined types were placed in this encounter.  Meds ordered this encounter  Medications   methylPREDNISolone  (MEDROL ) 4 MG tablet  Sig: Take 1 tablet (4 mg total) by mouth daily.    Dispense:  30 tablet    Refill:  1     Follow-Up Instructions: No follow-ups on file.   Lonni LELON Ester, MD  Note - This record has been created using [employer].  Chart creation errors have been sought, but may not always  have been located. Such creation errors do not reflect on  the standard of medical care. "

## 2024-09-09 NOTE — Telephone Encounter (Signed)
 Patient contacted the office again regarding if she could have a virtual or telephone visit. Patient states she is also miserable and needs RA medication. Please advise.

## 2024-09-09 NOTE — Telephone Encounter (Signed)
 Patient contacted the office again to inquire if her visit could be virtual. Please advise.

## 2024-09-10 NOTE — Telephone Encounter (Signed)
 Is she taking the currently prescribed RA medication (Actemra )? I am okay with changing her visit to virtual/telephone if she can't come but this may limit options for what we can get done on that day.

## 2024-09-10 NOTE — Telephone Encounter (Signed)
 Patient advised Is she taking the currently prescribed RA medication (Actemra )? Dr. Jeannetta is okay with changing her visit to virtual/telephone if she can't come but this may limit options for what we can get done on that day. Patient states is still taking the Actemra  and started back last Saturday. Patient inquired what options would be limited but still decided to have her visit virtual to reduce the risk of getting sick. Patient's visit changed to virtual.

## 2024-09-11 ENCOUNTER — Inpatient Hospital Stay: Attending: Hematology

## 2024-09-12 ENCOUNTER — Encounter: Admitting: Internal Medicine

## 2024-09-12 ENCOUNTER — Ambulatory Visit: Admitting: Surgical

## 2024-09-12 ENCOUNTER — Encounter: Payer: Self-pay | Admitting: Internal Medicine

## 2024-09-12 ENCOUNTER — Inpatient Hospital Stay

## 2024-09-12 VITALS — Resp 17 | Ht 60.0 in | Wt <= 1120 oz

## 2024-09-12 DIAGNOSIS — M810 Age-related osteoporosis without current pathological fracture: Secondary | ICD-10-CM

## 2024-09-12 DIAGNOSIS — E871 Hypo-osmolality and hyponatremia: Secondary | ICD-10-CM

## 2024-09-12 DIAGNOSIS — M150 Primary generalized (osteo)arthritis: Secondary | ICD-10-CM

## 2024-09-12 DIAGNOSIS — M0579 Rheumatoid arthritis with rheumatoid factor of multiple sites without organ or systems involvement: Secondary | ICD-10-CM

## 2024-09-12 DIAGNOSIS — M316 Other giant cell arteritis: Secondary | ICD-10-CM

## 2024-09-12 MED ORDER — METHYLPREDNISOLONE 4 MG PO TABS: 4.0000 mg | ORAL_TABLET | Freq: Every day | ORAL | 1 refills | Status: AC

## 2024-09-18 ENCOUNTER — Inpatient Hospital Stay: Attending: Hematology

## 2024-09-19 ENCOUNTER — Inpatient Hospital Stay: Attending: Hematology

## 2024-09-19 ENCOUNTER — Telehealth: Payer: Self-pay | Admitting: *Deleted

## 2024-09-19 DIAGNOSIS — M0579 Rheumatoid arthritis with rheumatoid factor of multiple sites without organ or systems involvement: Secondary | ICD-10-CM

## 2024-09-19 MED ORDER — MINOCYCLINE HCL 50 MG PO CAPS: 50.0000 mg | ORAL_CAPSULE | Freq: Every day | ORAL | 1 refills | Status: AC

## 2024-09-19 NOTE — Telephone Encounter (Signed)
 Patient contacted the office and left message stating she cannot take the steroids any longer. Patient states they are too harsh on her stomach. Patient would like a call back ASAP to discuss if there are any other medications that can slow the progression of her RA. Patient let 3 messages requesting a call back.

## 2024-09-19 NOTE — Telephone Encounter (Signed)
 I spoke with Vanessa Lara. She expresses concern she cannot tolerate the oral steroids due to GI irritation but has a lot of pain and swelling when off the medication. She is on Actemra  at about 5 weeks since resuming after the previous interruption. She also had recent increase in her fentanyl  patch treatment with her pain management that has partially improved symptoms. I recommend we cna try adding oral minocycline  at a low dose as combination treatment due to intolerance of all other first line options. We could titrate the dose after at least 2 weeks if tolerating.

## 2024-09-19 NOTE — Addendum Note (Signed)
 Addended by: JEANNETTA LONNI ORN on: 09/19/2024 05:07 PM   Modules accepted: Orders

## 2024-09-22 ENCOUNTER — Ambulatory Visit: Admitting: Sports Medicine

## 2024-09-22 ENCOUNTER — Inpatient Hospital Stay: Attending: Hematology

## 2024-09-22 DIAGNOSIS — D500 Iron deficiency anemia secondary to blood loss (chronic): Secondary | ICD-10-CM | POA: Diagnosis present

## 2024-09-22 LAB — CBC WITH DIFFERENTIAL/PLATELET
Abs Immature Granulocytes: 0.01 K/uL (ref 0.00–0.07)
Basophils Absolute: 0.1 K/uL (ref 0.0–0.1)
Basophils Relative: 1 %
Eosinophils Absolute: 0.1 K/uL (ref 0.0–0.5)
Eosinophils Relative: 1 %
HCT: 37.1 % (ref 36.0–46.0)
Hemoglobin: 12.2 g/dL (ref 12.0–15.0)
Immature Granulocytes: 0 %
Lymphocytes Relative: 24 %
Lymphs Abs: 1.4 K/uL (ref 0.7–4.0)
MCH: 31.2 pg (ref 26.0–34.0)
MCHC: 32.9 g/dL (ref 30.0–36.0)
MCV: 94.9 fL (ref 80.0–100.0)
Monocytes Absolute: 0.5 K/uL (ref 0.1–1.0)
Monocytes Relative: 8 %
Neutro Abs: 3.9 K/uL (ref 1.7–7.7)
Neutrophils Relative %: 66 %
Platelets: 248 K/uL (ref 150–400)
RBC: 3.91 MIL/uL (ref 3.87–5.11)
RDW: 14.7 % (ref 11.5–15.5)
WBC: 5.9 K/uL (ref 4.0–10.5)
nRBC: 0 % (ref 0.0–0.2)

## 2024-09-22 LAB — SAMPLE TO BLOOD BANK

## 2024-09-23 ENCOUNTER — Other Ambulatory Visit (HOSPITAL_COMMUNITY): Payer: Self-pay | Admitting: Adult Health Nurse Practitioner

## 2024-09-23 DIAGNOSIS — M810 Age-related osteoporosis without current pathological fracture: Secondary | ICD-10-CM

## 2024-09-29 ENCOUNTER — Encounter: Payer: Self-pay | Admitting: *Deleted

## 2024-10-01 ENCOUNTER — Other Ambulatory Visit: Payer: Self-pay

## 2024-10-01 NOTE — Progress Notes (Signed)
 Telephone visit  Vanessa Lara is a 68 y.o. female here for follow up of her rheumatoid arthritis and giant cell arteritis on Actemra  162 mg [place] weekly. She is very stressed by medical issues and reports feeling worse every day. Fatigue limiting her activity severely within a few hours after waking. Headaches are now affecting her right side similar as the left side, and resumed on her migraine medications. She was prescribed decadron  but is off this due to stomach intolerance. She was off Actemra  due to infection but has resumed this for 2 weeks, 3 total doses.  Nose is very raw and sometimes bleeding. Notices cracking and bleeding after any manipulation. Reports trying 'everything' describing keeping area very clean and has applied barrier ointment or cream.  She had injections in the right wrist and left shoulder 11/20 with orthopedics. She did not follow up since then and is concerned about getting out in the cold or getting sick.  She complains of neuropathy painful and severely cold discomfort in hands and feet worse now on account of cold temperatures.  She is tearful and reports high level of anxiety about her healthcare and 'trapped in her own home.'  CoQ-10 supplement question, 200 mcg daily interested for energy and her inflammation   Prescribed course of oral medrol  to try next step with reporting intolerance to oral prednisone  but apparently has tolerated other steroid forms. Will need to see back in clinic for longer term medication adjustment. Not a candidate for additional joint injections right now after recent with orthopedics office.

## 2024-10-03 ENCOUNTER — Other Ambulatory Visit: Payer: Self-pay

## 2024-10-03 ENCOUNTER — Encounter: Payer: Self-pay | Admitting: Oncology

## 2024-10-07 ENCOUNTER — Inpatient Hospital Stay: Attending: Hematology

## 2024-10-07 ENCOUNTER — Other Ambulatory Visit (HOSPITAL_COMMUNITY): Payer: Self-pay

## 2024-10-07 DIAGNOSIS — D508 Other iron deficiency anemias: Secondary | ICD-10-CM | POA: Insufficient documentation

## 2024-10-07 DIAGNOSIS — D500 Iron deficiency anemia secondary to blood loss (chronic): Secondary | ICD-10-CM

## 2024-10-07 LAB — CBC WITH DIFFERENTIAL/PLATELET
Abs Immature Granulocytes: 0.02 K/uL (ref 0.00–0.07)
Basophils Absolute: 0.1 K/uL (ref 0.0–0.1)
Basophils Relative: 1 %
Eosinophils Absolute: 0 K/uL (ref 0.0–0.5)
Eosinophils Relative: 1 %
HCT: 38.5 % (ref 36.0–46.0)
Hemoglobin: 12.7 g/dL (ref 12.0–15.0)
Immature Granulocytes: 0 %
Lymphocytes Relative: 30 %
Lymphs Abs: 1.8 K/uL (ref 0.7–4.0)
MCH: 32.1 pg (ref 26.0–34.0)
MCHC: 33 g/dL (ref 30.0–36.0)
MCV: 97.2 fL (ref 80.0–100.0)
Monocytes Absolute: 0.5 K/uL (ref 0.1–1.0)
Monocytes Relative: 8 %
Neutro Abs: 3.6 K/uL (ref 1.7–7.7)
Neutrophils Relative %: 60 %
Platelets: 252 K/uL (ref 150–400)
RBC: 3.96 MIL/uL (ref 3.87–5.11)
RDW: 14.3 % (ref 11.5–15.5)
WBC: 5.9 K/uL (ref 4.0–10.5)
nRBC: 0 % (ref 0.0–0.2)

## 2024-10-09 ENCOUNTER — Other Ambulatory Visit: Payer: Self-pay

## 2024-10-10 ENCOUNTER — Other Ambulatory Visit: Payer: Self-pay

## 2024-10-10 NOTE — Progress Notes (Signed)
 Specialty Pharmacy Refill Coordination Note  Vanessa Lara is a 69 y.o. female contacted today regarding refills of specialty medication(s) Tocilizumab  (Actemra  ACTPen)   Patient requested Delivery   Delivery date: 10/15/24   Verified address: [address]  [place]   Medication will be filled on: 10/14/24

## 2024-10-13 ENCOUNTER — Telehealth: Payer: Self-pay | Admitting: Surgical

## 2024-10-13 ENCOUNTER — Other Ambulatory Visit: Payer: Self-pay | Admitting: Internal Medicine

## 2024-10-13 ENCOUNTER — Ambulatory Visit: Admitting: Surgical

## 2024-10-13 DIAGNOSIS — R5381 Other malaise: Secondary | ICD-10-CM

## 2024-10-13 DIAGNOSIS — M150 Primary generalized (osteo)arthritis: Secondary | ICD-10-CM

## 2024-10-13 NOTE — Telephone Encounter (Signed)
 Patient would like a call before her appointment time.

## 2024-10-13 NOTE — Telephone Encounter (Signed)
 Last Fill: 05/01/2024  Next Visit: 11/05/2024  Last Visit: 09/12/2024 Video Visit  Dx: Temporal arteritis syndrome   Current Dose per office note on 09/12/2024: dose not discussed.   Okay to refill Tizanidine ?

## 2024-10-14 ENCOUNTER — Other Ambulatory Visit: Payer: Self-pay

## 2024-10-16 ENCOUNTER — Ambulatory Visit: Admitting: Sports Medicine

## 2024-10-16 ENCOUNTER — Telehealth: Payer: Self-pay | Admitting: Surgical

## 2024-10-16 NOTE — Telephone Encounter (Signed)
 We can talk about it on her appointment on 1/20.  I do not think she needs a nerve block prior to having the injection.  I can numb it up first if she would like this.

## 2024-10-16 NOTE — Telephone Encounter (Signed)
 Pt called with some confusion and asking for a nerve block procedure for her hand. Pt states she got a message form April J. Please call pt about this matter at [phone]

## 2024-10-16 NOTE — Telephone Encounter (Signed)
 Talked with patient concerning her hand pain.  Patient has an appt. Scheduled for her hand pain.  She would like an injection for her right hand , but wanted to know about getting a nerve block to have the injection done and if Dr. Eldonna does this.  CB# [phone]. Please advise.  Thank you

## 2024-10-17 NOTE — Progress Notes (Unsigned)
 This encounter was created in error - please disregard.  This encounter was created in error - please disregard.

## 2024-10-21 ENCOUNTER — Other Ambulatory Visit: Payer: Self-pay

## 2024-10-21 ENCOUNTER — Ambulatory Visit: Admitting: Surgical

## 2024-10-21 VITALS — Wt 71.2 lb

## 2024-10-21 DIAGNOSIS — M79641 Pain in right hand: Secondary | ICD-10-CM

## 2024-10-21 DIAGNOSIS — M19041 Primary osteoarthritis, right hand: Secondary | ICD-10-CM | POA: Diagnosis not present

## 2024-10-21 DIAGNOSIS — M170 Bilateral primary osteoarthritis of knee: Secondary | ICD-10-CM

## 2024-10-21 DIAGNOSIS — M1711 Unilateral primary osteoarthritis, right knee: Secondary | ICD-10-CM

## 2024-10-21 DIAGNOSIS — M150 Primary generalized (osteo)arthritis: Secondary | ICD-10-CM

## 2024-10-21 DIAGNOSIS — R5381 Other malaise: Secondary | ICD-10-CM

## 2024-10-22 ENCOUNTER — Encounter: Payer: Self-pay | Admitting: Surgical

## 2024-10-22 MED ORDER — HYALURONAN 88 MG/4ML IX SOSY
88.0000 mg | PREFILLED_SYRINGE | INTRA_ARTICULAR | Status: AC | PRN
  Administered 2024-10-21: 88 mg via INTRA_ARTICULAR

## 2024-10-22 MED ORDER — METHYLPREDNISOLONE ACETATE 40 MG/ML IJ SUSP
20.0000 mg | INTRAMUSCULAR | Status: AC | PRN
  Administered 2024-10-21: 20 mg

## 2024-10-22 MED ORDER — BUPIVACAINE HCL 0.25 % IJ SOLN
0.5000 mL | INTRAMUSCULAR | Status: AC | PRN
  Administered 2024-10-21: .5 mL

## 2024-10-22 NOTE — Progress Notes (Signed)
 "  Office Visit Note   Patient: Vanessa Lara           Date of Birth: [birth year]           MRN: [id number] Visit Date: 10/21/2024 Requested by: Suanne Pfeiffer, NP [address] [place],  [place] [address] PCP: Suanne Pfeiffer, NP  Subjective: Chief Complaint  Patient presents with   Right Hand - Pain   Right Knee - Pain    HPI: Vanessa Lara is a 69 y.o. female who presents to the office reporting right hand pain.  Patient states that she has had right hand pain that has been increasing over the last 3 months.  Had radiocarpal injection into the right wrist on 08/24/2024 with good relief from a lot of her wrist pain but this only lasted for about 4 to 6 weeks.  She is having a lot of pain throughout the entirety of her hands without numbness or tingling consistently.  The majority of the pain is in the radial and dorsal aspects of the wrist with some radiation of symptoms into the MCP joints primarily around the fifth MCP.  No new injury or any falls.  Also here today for right knee gel injection..                ROS: All systems reviewed are negative as they relate to the chief complaint within the history of present illness.  Patient denies fevers or chills.  Assessment & Plan: Visit Diagnoses:  1. Arthritis of right hand   2. Pain in right hand   3. Primary osteoarthritis of both knees     Plan: Impression is right hand with chronic deformity from her rheumatoid arthritis.  We discussed options available to patient.  She would like to try another radiocarpal injection but it is too soon to do this since her last injection was just under 2 months ago.  We could repeat this in about 5 weeks if she would like to.  Having a lot of pain localizing to the fifth MCP joint and she would like to have this injected.  This was injected under ultrasound guidance and patient tolerated procedure well without complication.  We also went ahead with right knee Monovisc injection today.   We discussed referral to hand surgeon to discuss all the potential nonsurgical options available to her that may be of use and she is agreeable to this.  She has seen Dr. Erwin before but she would prefer to see another hand surgeon to get another opinion.  We will refer her to see Dr. Camella again.  Follow-Up Instructions: Return in about 5 weeks (around 11/25/2024).   Orders:  Orders Placed This Encounter  Procedures   XR Hand Complete Right   US  Guided Needle Placement - No Linked Charges   No orders of the defined types were placed in this encounter.     Procedures: Large Joint Inj: R knee on 10/21/2024 8:52 AM Indications: diagnostic evaluation, joint swelling and pain Details: 25 G 1.5 in needle, superolateral approach  Arthrogram: No  Medications: 88 mg Hyaluronan 88 MG/4ML Outcome: tolerated well, no immediate complications Procedure, treatment alternatives, risks and benefits explained, specific risks discussed. Consent was given by the patient. Immediately prior to procedure a time out was called to verify the correct patient, procedure, equipment, support staff and site/side marked as required. Patient was prepped and draped in the usual sterile fashion.    Hand/UE Inj: R  small MCP for osteoarthritis on 10/21/2024 8:53 AM Indications: diagnostic, pain and therapeutic Details: 25 G needle, ultrasound-guided dorsal approach Medications: 0.5 mL bupivacaine  0.25 %; 20 mg methylPREDNISolone  acetate 40 MG/ML Outcome: tolerated well, no immediate complications Procedure, treatment alternatives, risks and benefits explained, specific risks discussed. Consent was given by the patient. Immediately prior to procedure a time out was called to verify the correct patient, procedure, equipment, support staff and site/side marked as required. Patient was prepped and draped in the usual sterile fashion.       Clinical Data: No additional findings.  Objective: Vital Signs: Wt 71 lb 3.2  oz (32.3 kg)   BMI 13.91 kg/m   Physical Exam:  Constitutional: Patient appears well-developed HEENT:  Head: Normocephalic Eyes:EOM are normal Neck: Normal range of motion Cardiovascular: Normal rate Pulmonary/chest: Effort normal Neurologic: Patient is alert Skin: Skin is warm Psychiatric: Patient has normal mood and affect  Ortho Exam: Ortho exam demonstrates right hand with no significant swelling.  Multiple areas of ecchymosis that patient states is atraumatic.  She has chronic deformity to all of the MCP joints with ulnar deviation at these joints.  Extensor tendons are subluxed from the MCP joint dorsally.  She has minimal finger abduction ability.  Not able to make full fist.  2+ radial pulse of the right upper extremity.  Specialty Comments:  MRI CERVICAL SPINE WITHOUT CONTRAST   TECHNIQUE: Multiplanar, multisequence MR imaging of the cervical spine was performed. No intravenous contrast was administered.   COMPARISON:  None.   FINDINGS: Alignment: 2 mm retrolisthesis of C5 on C6.   Vertebrae: No acute fracture, evidence of discitis, or bone lesion.   Cord: Normal signal and morphology.   Posterior Fossa, vertebral arteries, paraspinal tissues: Posterior fossa demonstrates no focal abnormality. Vertebral artery flow voids are maintained. Paraspinal soft tissues are unremarkable.   Disc levels:   Discs: Degenerative disease with disc height loss at C4-5 and C5-6.   C2-3: No significant disc bulge. No neural foraminal stenosis. No central canal stenosis.   C3-4: Mild broad-based disc bulge. No foraminal or central canal stenosis.   C4-5: Mild broad-based disc bulge. No foraminal or central canal stenosis.   C5-6: Mild broad-based disc bulge. Bilateral uncovertebral degenerative changes. Moderate left and severe right foraminal stenosis. No spinal stenosis.   C6-7: Mild broad-based disc bulge. No foraminal or central canal stenosis.   C7-T1: No  significant disc bulge. No neural foraminal stenosis. No central canal stenosis.   IMPRESSION: 1. At C5-6 there is a mild broad-based disc bulge. Bilateral uncovertebral degenerative changes. Moderate left and severe right foraminal stenosis. 2. Otherwise, mild cervical spine spondylosis as described above.     Electronically Signed   By: Julaine Blanch M.D.   On: 12/16/2021 10:51  Imaging: XR Hand Complete Right Result Date: 10/22/2024 PA, oblique, lateral views of the right hand reviewed.  Severe degenerative changes noted throughout the radiocarpal joint as well as the first [facility] joint.  There is also severe deformity noted to the MCP joints with windswept appearance.  No acute fracture  US  Guided Needle Placement - No Linked Charges Result Date: 10/22/2024 Ultrasound demonstrates visualization of the fifth MCP joint of the right hand with subsequent injection without complication.    PMFS History: Patient Active Problem List   Diagnosis Date Noted   Hyponatremia 05/07/2024   Temporal arteritis syndrome (HCC) 05/07/2024    Class: Acute   Hypotension 05/07/2024   Chronic gastritis without bleeding 08/01/2023   Generalized  anxiety disorder 08/01/2023   Redundant colon 08/01/2023   Mixed hyperlipidemia 08/01/2023   History of CVA (cerebrovascular accident) 11/14/2022   Insomnia due to medical condition 03/15/2022   Degeneration of intervertebral disc of cervical region 12/28/2021   Physical deconditioning 11/11/2021   Elevated serum lactate dehydrogenase (LDH) 08/17/2021   Vitamin D  deficiency 08/17/2021   Vitamin B12 deficiency 07/19/2021   Hardening of the aorta (main artery of the heart) 06/21/2021   Solitary lung nodule 06/15/2021   Protein-calorie malnutrition, severe 05/18/2021   Osteoporosis 06/15/2020   Iron  deficiency anemia 04/09/2020   Primary osteoarthritis involving multiple joints 12/31/2018   Rheumatoid arthritis involving multiple sites with positive  rheumatoid factor (HCC) 12/31/2018   Irritable bowel syndrome with both constipation and diarrhea 10/28/2018   Mitral valve prolapse 11/23/2015   Past Medical History:  Diagnosis Date   Anemia    Anxiety    Arthritis    RA   Bursitis    Cataract    CHF (congestive heart failure) (HCC)    Collagen vascular disease    Depression    Endometriosis    Heart murmur    High cholesterol    Hypertension    IBS (irritable bowel syndrome)    Lymphadenopathy    Mitral valve prolapse    Osteoporosis    RA (rheumatoid arthritis) (HCC)    Rotator cuff tear    bilateral   Stroke (HCC) 06/2022   Uterus, adenomyosis     Family History  Problem Relation Age of Onset   Bone cancer Mother    Brain cancer Father    Brain cancer Sister    Breast cancer Sister    Renal Disease Sister    Pancreatic cancer Neg Hx    Esophageal cancer Neg Hx    Stomach cancer Neg Hx    Rectal cancer Neg Hx    Colon polyps Neg Hx     Past Surgical History:  Procedure Laterality Date   ARTERY BIOPSY Left 05/08/2024   Procedure: LEFT TEMPORAL ARTERY BIOPSY;  Surgeon: Pearline Norman RAMAN, MD;  Location[facility];  Service: Vascular;  Laterality: Left;   AXILLARY LYMPH NODE BIOPSY Right 10/12/2020   Procedure: RIGHT AXILLARY LYMPH NODE BIOPSY EXCISION;  Surgeon: Aron Shoulders, MD;  Location: [facility];  Service: General;  Laterality: Right;   BUNIONECTOMY Right    CARPAL TUNNEL RELEASE Right    COLONOSCOPY     COLONOSCOPY N/A 12/13/2015   Procedure: COLONOSCOPY;  Surgeon: Claudis RAYMOND Rivet, MD;  Location: [facility];  Service: Endoscopy;  Laterality: N/A;  9:55   Fatty tumor     2011 (left arm)   TONSILLECTOMY     TOTAL ABDOMINAL HYSTERECTOMY     2003   Social History   Occupational History   Occupation: retired  Tobacco Use   Smoking status: Some Days    Current packs/day: 0.00    Average packs/day: 0.5 packs/day for 33.0 years (16.5 ttl pk-yrs)    Types: Cigarettes    Start date: 22     Last attempt to quit: 2013    Years since quitting: 13.0    Passive exposure: Past   Smokeless tobacco: Never  Vaping Use   Vaping status: Never Used  Substance and Sexual Activity   Alcohol use: No    Alcohol/week: 0.0 standard drinks of alcohol   Drug use: No   Sexual activity: Not Currently        "

## 2024-10-24 ENCOUNTER — Other Ambulatory Visit: Payer: Self-pay

## 2024-10-27 ENCOUNTER — Telehealth: Payer: Self-pay | Admitting: Pharmacist

## 2024-10-27 NOTE — Telephone Encounter (Signed)
 Patient will need updated Actemra  SQ prior authorization.  She has upcoming OV On 11/05/2024  Can update pA after this  Sherry Pennant, PharmD, MPH, BCPS, CPP Clinical Pharmacist

## 2024-10-28 ENCOUNTER — Ambulatory Visit: Admitting: Sports Medicine

## 2024-10-29 NOTE — Progress Notes (Unsigned)
 "  Office Visit Note  Patient: Vanessa Lara             Date of Birth: [birth year]           MRN: [id number]             PCP: Suanne Pfeiffer, NP Referring: Suanne Pfeiffer, NP Visit Date: 11/05/2024   Subjective:  No chief complaint on file.   History of Present Illness: Vanessa Lara is a 69 y.o. female here for follow up on Actemra  162 mg [place] weekly and HCQ 200 mg 5 days per week.   Previous HPI 08/07/2024 Vanessa Lara is a 69 year old female with arthritis and giant cell arteritis and RA who presents for follow-up on Actemra  162 mg  weekly and HCQ 200 mg 5 days per week.   She is currently on Actemra  injections for her condition. Last month, she visited the orthopedics clinic and received injections in her knee and ankle for arthritis. She is scheduled for additional injections later this month.   She experiences significant pain in her thumb, describing it as 'the whole thumb's popping in and out,' with pain localized around a specific joint. This pain severely limits her daily activities.   Regarding her Actemra  treatment, she has not had her blood tests rechecked since starting the medication. Previous tests showed high sedimentation rate and C-reactive protein levels. She is also concerned about her sodium levels, which were previously low at 125 mmol/L, with accompanying low chloride, bicarbonate, and calcium  levels. She received saline treatment for this issue.   She reports experiencing migraines, noting that they are not as severe as before but still present. The pain is localized and does not extend around her hairline as it previously did.         Previous HPI 05/06/2024 Vanessa Lara is a 69 y.o. female here for follow up with rheumatoid arthritis on HCQ 200 mg daily x5 days weekly who presents with severe pain and medication intolerance. She presents with inadequate response to current treatment and concerns about medication side effects.   She has had multiple  recent injections throughout orthopedics clinic, but these only provide relief for about a month and a half. She is frustrated with the limited duration of relief and the restrictions on the frequency and dosage of these injections. She is seeking alternative treatments that do not involve narcotics.   She uses a muscle relaxer that causes significant cognitive impairment, limiting her ability to function during the day. To manage this, she takes small doses throughout the day, but this is not ideal. She is concerned about adding more medication due to her long-standing irritable bowel syndrome (IBS), which has been present for 53 years and has left her gastrointestinal system compromised.   She is interested in trying a different rheumatoid arthritis medication due to previous drugs either causing side effects or being ineffective. She recalls a past x-ray showing bone erosion and wants to prevent progression. Her last sedimentation rate was 58 in April.   She describes significant functional limitations due to her condition, particularly in her hands, which she describes as having difficulty with manipulation. She also experiences issues with her shoulders and back, where she received an injection two weeks ago that has since worn off. She notes muscle atrophy and difficulty maintaining foot position, which she attributes to overall muscle weakness.   Her diet is limited due to IBS, and she struggles to maintain adequate calorie intake. She  is sevrely underweight and worsening, down to 62 lbs and BMI of 12. She eats red meat, fish, chicken, green vegetables, and bread but avoids high-fat foods due to gastrointestinal distress.   She recalls a past incident of heart failure induced by medication, which caused significant water  weight gain without typical symptoms like swelling. She is cautious about medication side effects, particularly those affecting her heart.         Previous  HPI 01/07/2024 Vanessa Lara is a 70 year old female with rheumatoid arthritis on HCQ 200 mg daily x5 days weekly who presents with severe pain and medication intolerance.   She experiences severe pain due to arthritis, exacerbated by muscle spasms in her back. The pain is intense and significantly impacts her quality of life, leaving her feeling 'miserable' and without a 'happy place'. Flare-ups occur every other day or every third day, preventing her from performing daily activities.   She has a history of intolerance to several medications. Azathioprine  caused severe gastrointestinal issues, and she is currently on hydroxychloroquine  for arthritis. She cannot take codeine  due to gastrointestinal side effects related to her IBS. Muscle relaxants like Flexeril  have been ineffective in managing her symptoms.   A recent bone marrow study was performed without anesthesia due to low blood pressure, which she found distressing. She experiences dizziness throughout the day, which she suspects may be related to anemia or other underlying issues. She has gained a small amount of weight recently, about four ounces.   She has been unable to follow up with an orthopedist due to her condition flaring up, making it difficult for her to travel. She also struggles with arranging transportation as her plans often fall through at the last minute.      Previous HPI 11/19/23 Vanessa Lara is a 69 year old female with rheumatoid arthritis on hydroxychloroquine  200 mg once daily for 5 days per week.   She experiences significant pain and stiffness, particularly on Sundays, leading her to take an additional half dose of her medication on Sundays for the past two weekends. She describes waking up with severe stiffness and being unable to move by Monday if she does not take the extra dose. She is seeking an alternative medication to manage her symptoms more effectively.   She describes pain associated with bursitis and  possible rotator cuff issues, noting a pressure point that radiates from her shoulder down towards her armpit and into her upper arm, forming a 'V' shape. She is scheduled to receive an injection for this pain.   She reports issues with her foot, including a previously twisted or sprained ankle that healed but then became painful and red, particularly around a bone that feels bruised. She also mentions a rod in her big toe that has shifted, causing pain and misalignment of her toes. She was previously told that surgery could correct her toes, but now her arthritis and varicose veins have worsened, raising concerns about circulation, infection, and potential amputation. Her bone density is very low, complicating surgical options.   She mentions her knuckles are sore, with one being particularly bad, and she experiences pain that radiates down her ear. She also notes fluctuations in her weight, questioning whether it is due to scale differences or her condition.      Previous HPI 07/04/23 Vanessa Lara is a 69 y.o. female here for follow up for seropositive RA and osteoarthritis and osteoporosis she stopped hydroxychloroquine  after last visit due to concern about cardiac  arrhythmia alongside Paxil  and duloxetine .  She was only able to take a low-dose of the medication she did feel it improved her overall condition but still had persistent arthritis of numerous areas regardless.  She had an updated bone density scan on July 15 still with osteoporosis of the lumbar spine although better than 2022 and with femur osteopenia similar to the previous test.  She is taking Venofer  for iron  deficiency anemia this ongoing to correct it with her goal of enough to tolerate foot surgery.  She underwent endoscopy with no upper or lower GI findings to account for heme positive stool. Acute problem today increased right ankle pain and swelling started since 3 weeks ago.  She did not recall any fall trip or other trauma to  that area.  Initially there was a high degree of swelling he put a wrap around this and can see prominent indentation the swelling has largely decreased compared to the outset but still has pain with direct pressure and with weightbearing.   Imaging reviewed DEXA Review [facility] Spine L1-L2 04/16/23  -3.8    0.712 g/cm2 04/12/21  -4.5    0.627 g/cm2 04/13/20  -4.2    0.665 g/cm2   DualFemur Total Mean 04/16/23  -2.6    0.680 g/cm2 04/12/21  -2.6    0.676 g/cm2 04/13/20  -2.2    0.736 g/cm2     03/08/23 Vanessa Lara is a 69 y.o. female here for follow up for seropositive RA.  Currently off any disease specific medication she cannot tolerate taking the sulfasalazine  due to severe stomach irritation described as burning up her stomach.  Continues having joint pain and stiffness on a daily basis overall feels her function is very poor due to the severity.  Had labs last month still anemic at hemoglobin of 9.8 being treated with infusion for iron  deficiency anemia.   01/16/23 Vanessa Lara is a 69 y.o. female here for follow up for seropositive RA.  Currently health maintenance treatment after discontinuing 5 mg prednisone  is on medication for joint pain for RA along with osteoarthritis and chronic pain syndrome.  Had recent knee steroid injection since her last visit but reports overall widespread symptoms are severe.  Particularly is been noticing some increased trouble with pain and some swelling involving the wrist worse on left side.   10/03/22 Vanessa Lara is a 69 y.o. female here for follow up for seropositive RA currently off long term DMARD with prednisone  5 mg daily as needed.  She is overall not doing well feels like pain and stiffness is a big problem but more severe is her fatigue and feels a generalized getting weaker with difficulty moving and with her upper body strength.  She pretty consistently has to take the prednisone  for otherwise does not move around much during the day.  Continues to get  a benefit with the Flexeril  at nighttime with slight decrease in frequency of awakenings.  She is also concerned about getting an update to her bone density testing ongoing oral alendronate  treatment.  She is also still having a lot of pain and difficulty with walking from her claw toe deformities on the foot and planning to follow-up with surgery clinic about management of this.   06/28/22 Vanessa Lara is a 69 y.o. female here for follow up for seropositive RA now off of treatment after stopping the Kevzara .  Since she stopped taking the medication her complaint of night sweats and fatigue have improved. She has  seen a worsening of joint pain in several areas particularly around the MCP joints of both hands. For knee pain she saw orthopedic surgery clinic yesterday and had injections on both sides.  She also discussed getting set up to do right SI joint steroid injection.  Biggest complaint today is back pain on the right side around the middle of the back and around the scapula.  This bothers her especially with certain movements and trying to bend side to side.    05/18/2022  Vanessa Lara is a 70 y.o. female here for follow up for seropositive rheumatoid arthritis on Kevzara  150 mg [place] q14days. She feels arthritis has been fairly well controlled but has pain worse in her left foot mostly. She had GI issues suspected UTI but with negative culture since last visit. She feels a sensation like her brain is numb. Also has ongoing upper neck pain without radiating symptoms.    02/07/2022 Vanessa Lara is a 69 y.o. female here for follow up for rheumatoid arthritis with chronic joint pain in multiple areas and chronic fatigue labs showing elevated rheumatoid factor. Kevzara  new start on 12/15/2021. She felt an improvement when taking the injections within first weeks. Accidentally took the shots twice in a week due to forgetfulness or mistake but did not notice any major problems. She called in on account of  increased joint pain but felt no improvement with the prescribed medrol  dose pack. She did feel more lightheadedness or off balance feeling while taking this so stopped after about 3 days. Currently back pain and her left foot pain are worst issues.   Previous HPI 12/05/2021 Vanessa Lara is a 69 y.o. female here for evaluation of rheumatoid arthritis with chronic joint pain in multiple areas and chronic fatigue labs showing elevated rheumatoid factor. She has history of mitral valve prolapse, severe osteoporosis, osteoarthritis, and severe weight loss and deconditioning. She was diagnosed with seropositive RA in 2018 with Dr. Curt and most recently saw Dr. Mai in 11/2020. At that time recommended to start hydroxychloroquine  for inflammatory arthritis symptoms. She previously tried methotrexate, simponi  aria, and orencia  with poor tolerance. Imaging consistent with degenerative arthritis in cervical spine and knee but also with some partial tendon tear in shoulder and some synovitis present in knee on MRI imaging. She has taken multiple medications for pain including NSAIDs, prednisone , gabapentin , tramadol , and hydrocodone  or oxycodone . She has a lot of pain in multiple areas but is also severely fatigued. She has significant weight loss down to 70 lbs unintentionally reports chronic IBS issues but no recent changes during this period of weight loss. Joint pain also limits her activity quite a bit with chronic deformities and also ongoing swelling in bilateral hands. She has also developed left sided headache localized above the temporal area. She has a lot of pain at the base of the skull and occiput without clear radiation of symptoms. She is scheduled to see Dr. Rush for headache evaluation and management 3/28.   DMARD Hx AZA LEF HCQ SSZ MTX Simponi  Orencia  Kevzara    05/2017 RF 15 CCP 32 HBV neg HCV neg   No Rheumatology ROS completed.   PMFS History:  Patient Active Problem List    Diagnosis Date Noted   Hyponatremia 05/07/2024   Temporal arteritis syndrome (HCC) 05/07/2024    Class: Acute   Hypotension 05/07/2024   Chronic gastritis without bleeding 08/01/2023   Generalized anxiety disorder 08/01/2023   Redundant colon 08/01/2023   Mixed hyperlipidemia 08/01/2023  History of CVA (cerebrovascular accident) 11/14/2022   Insomnia due to medical condition 03/15/2022   Degeneration of intervertebral disc of cervical region 12/28/2021   Physical deconditioning 11/11/2021   Elevated serum lactate dehydrogenase (LDH) 08/17/2021   Vitamin D  deficiency 08/17/2021   Vitamin B12 deficiency 07/19/2021   Hardening of the aorta (main artery of the heart) 06/21/2021   Solitary lung nodule 06/15/2021   Protein-calorie malnutrition, severe 05/18/2021   Osteoporosis 06/15/2020   Iron  deficiency anemia 04/09/2020   Primary osteoarthritis involving multiple joints 12/31/2018   Rheumatoid arthritis involving multiple sites with positive rheumatoid factor (HCC) 12/31/2018   Irritable bowel syndrome with both constipation and diarrhea 10/28/2018   Mitral valve prolapse 11/23/2015    Past Medical History:  Diagnosis Date   Anemia    Anxiety    Arthritis    RA   Bursitis    Cataract    CHF (congestive heart failure) (HCC)    Collagen vascular disease    Depression    Endometriosis    Heart murmur    High cholesterol    Hypertension    IBS (irritable bowel syndrome)    Lymphadenopathy    Mitral valve prolapse    Osteoporosis    RA (rheumatoid arthritis) (HCC)    Rotator cuff tear    bilateral   Stroke (HCC) 06/2022   Uterus, adenomyosis     Family History  Problem Relation Age of Onset   Bone cancer Mother    Brain cancer Father    Brain cancer Sister    Breast cancer Sister    Renal Disease Sister    Pancreatic cancer Neg Hx    Esophageal cancer Neg Hx    Stomach cancer Neg Hx    Rectal cancer Neg Hx    Colon polyps Neg Hx    Past Surgical History:   Procedure Laterality Date   ARTERY BIOPSY Left 05/08/2024   Procedure: LEFT TEMPORAL ARTERY BIOPSY;  Surgeon: Pearline Norman RAMAN, MD;  Location[facility];  Service: Vascular;  Laterality: Left;   AXILLARY LYMPH NODE BIOPSY Right 10/12/2020   Procedure: RIGHT AXILLARY LYMPH NODE BIOPSY EXCISION;  Surgeon: Aron Shoulders, MD;  Location: [facility];  Service: General;  Laterality: Right;   BUNIONECTOMY Right    CARPAL TUNNEL RELEASE Right    COLONOSCOPY     COLONOSCOPY N/A 12/13/2015   Procedure: COLONOSCOPY;  Surgeon: Claudis RAYMOND Rivet, MD;  Location: [facility];  Service: Endoscopy;  Laterality: N/A;  9:55   Fatty tumor     2011 (left arm)   TONSILLECTOMY     TOTAL ABDOMINAL HYSTERECTOMY     2003   Social History   Social History Narrative   Not on file   Immunization History  Administered Date(s) Administered   Influenza, Quadrivalent, Recombinant, Inj, Pf 07/11/2017     Objective: Vital Signs: There were no vitals taken for this visit.   Physical Exam   Musculoskeletal Exam: ***   Investigation: No additional findings.  Imaging: XR Hand Complete Right Result Date: 10/22/2024 PA, oblique, lateral views of the right hand reviewed.  Severe degenerative changes noted throughout the radiocarpal joint as well as the first [facility] joint.  There is also severe deformity noted to the MCP joints with windswept appearance.  No acute fracture  US  Guided Needle Placement - No Linked Charges Result Date: 10/22/2024 Ultrasound demonstrates visualization of the fifth MCP joint of the right hand with subsequent injection without complication.   Recent  Labs: Lab Results  Component Value Date   WBC 5.9 10/07/2024   HGB 12.7 10/07/2024   PLT 252 10/07/2024   NA 139 08/07/2024   K 3.3 (L) 08/07/2024   CL 103 08/07/2024   CO2 28 08/07/2024   GLUCOSE 62 (L) 08/07/2024   BUN 5 (L) 08/07/2024   CREATININE 0.61 08/07/2024   BILITOT <0.2 07/02/2024   ALKPHOS 75 07/02/2024    AST 28 07/02/2024   ALT 13 07/02/2024   PROT 6.0 (L) 07/02/2024   ALBUMIN 4.0 07/02/2024   CALCIUM  9.3 08/07/2024   GFRAA 92 11/06/2019   QFTBGOLDPLUS NEGATIVE 05/06/2024    Speciality Comments: PLQ Eye Exam: 06/30/2024 @ [facility] Follow up in 1 year   Discontinue PLQ per Eye Exam  Procedures:  No procedures performed Allergies: Abatacept , Codeine , Golimumab , Hydrocodone , Methylprednisolone , Naratriptan , Methotrexate and trimetrexate, and Prednisone    Assessment / Plan:     Visit Diagnoses:  Assessment & Plan Temporal arteritis syndrome (HCC)     Rheumatoid arthritis involving multiple sites with positive rheumatoid factor (HCC)     Hyponatremia      ***  Follow-Up Instructions: No follow-ups on file.   Norrin Shreffler M Malikhi Ogan, CMA  Note - This record has been created using Animal nutritionist.  Chart creation errors have been sought, but may not always  have been located. Such creation errors do not reflect on  the standard of medical care. "

## 2024-10-29 NOTE — Assessment & Plan Note (Signed)
 Vanessa Lara

## 2024-10-29 NOTE — Assessment & Plan Note (Signed)
 SABRA

## 2024-11-04 ENCOUNTER — Telehealth: Payer: Self-pay

## 2024-11-04 ENCOUNTER — Other Ambulatory Visit: Payer: Self-pay

## 2024-11-04 ENCOUNTER — Inpatient Hospital Stay

## 2024-11-04 NOTE — Telephone Encounter (Signed)
 Patient cannot make it in for her appointment tomorrow due to the weather. She is asking if you would be willing to do a virtual visit tomorrow instead of rescheduling? Please advise. Thanks!

## 2024-11-05 ENCOUNTER — Encounter: Admitting: Internal Medicine

## 2024-11-05 ENCOUNTER — Inpatient Hospital Stay

## 2024-11-05 DIAGNOSIS — E871 Hypo-osmolality and hyponatremia: Secondary | ICD-10-CM

## 2024-11-05 DIAGNOSIS — M0579 Rheumatoid arthritis with rheumatoid factor of multiple sites without organ or systems involvement: Secondary | ICD-10-CM

## 2024-11-05 DIAGNOSIS — M316 Other giant cell arteritis: Secondary | ICD-10-CM

## 2024-11-05 NOTE — Progress Notes (Signed)
 Submitted an URGENT Prior Authorization request to [employer] for ACTEMRA  SQ via CoverMyMeds. Will update once we receive a response.  Key: [id number]

## 2024-11-06 ENCOUNTER — Other Ambulatory Visit: Payer: Self-pay

## 2024-11-06 ENCOUNTER — Inpatient Hospital Stay: Attending: Hematology

## 2024-11-06 DIAGNOSIS — D500 Iron deficiency anemia secondary to blood loss (chronic): Secondary | ICD-10-CM

## 2024-11-06 LAB — CBC WITH DIFFERENTIAL/PLATELET
Abs Immature Granulocytes: 0.01 10*3/uL (ref 0.00–0.07)
Basophils Absolute: 0.1 10*3/uL (ref 0.0–0.1)
Basophils Relative: 1 %
Eosinophils Absolute: 0.1 10*3/uL (ref 0.0–0.5)
Eosinophils Relative: 1 %
HCT: 38.9 % (ref 36.0–46.0)
Hemoglobin: 12.7 g/dL (ref 12.0–15.0)
Immature Granulocytes: 0 %
Lymphocytes Relative: 28 %
Lymphs Abs: 1.9 10*3/uL (ref 0.7–4.0)
MCH: 31.5 pg (ref 26.0–34.0)
MCHC: 32.6 g/dL (ref 30.0–36.0)
MCV: 96.5 fL (ref 80.0–100.0)
Monocytes Absolute: 0.5 10*3/uL (ref 0.1–1.0)
Monocytes Relative: 8 %
Neutro Abs: 4.2 10*3/uL (ref 1.7–7.7)
Neutrophils Relative %: 62 %
Platelets: 242 10*3/uL (ref 150–400)
RBC: 4.03 MIL/uL (ref 3.87–5.11)
RDW: 13.2 % (ref 11.5–15.5)
WBC: 6.8 10*3/uL (ref 4.0–10.5)
nRBC: 0 % (ref 0.0–0.2)

## 2024-11-06 LAB — SAMPLE TO BLOOD BANK

## 2024-11-06 LAB — FERRITIN: Ferritin: 60 ng/mL (ref 11–307)

## 2024-11-06 LAB — IRON AND TIBC
Iron: 69 ug/dL (ref 28–170)
Saturation Ratios: 20 % (ref 10.4–31.8)
TIBC: 337 ug/dL (ref 250–450)
UIBC: 269 ug/dL

## 2024-11-07 ENCOUNTER — Other Ambulatory Visit: Payer: Self-pay

## 2024-11-07 NOTE — Progress Notes (Signed)
 Specialty Pharmacy Refill Coordination Note  Vanessa Lara is a 69 y.o. female contacted today regarding refills of specialty medication(s) Tocilizumab  (Actemra  ACTPen)   Patient requested Delivery   Delivery date: 11/11/24   Verified address: [address]  [name]   Medication will be filled on: 11/10/24

## 2024-11-07 NOTE — Progress Notes (Unsigned)
 "  [facility] [address], [place] [address]   CLINIC:  Medical Oncology/Hematology  PCP:  Vanessa Pfeiffer, NP [address] [place] [address] [phone]   REASON FOR VISIT:  Follow-up for normocytic anemia   CURRENT THERAPY: Intermittent IV iron  (last dose of Venofer  200 mg on 01/14/2024)  INTERVAL HISTORY:   Vanessa Lara 31b.o. female returns for routine follow-up of normocytic anemia.   She was last evaluated via telemedicine visit by Vanessa Barefoot PA-C on 07/03/2024.  She has not had any interim hospitalizations, surgeries, or new diagnoses since her last visit.***   At today's visit, she reports feeling poorly due to severe fatigue and worsening joint pain from rheumatoid arthritis.***  She continues to suffer from chronic pain, malnutrition, weight loss, severe fatigue, and general malaise.***  She is seeing multiple specialists for management of these chronic issues.*** She has little to no*** energy and 70***% appetite.   She continues to be severely underweight and cachectic.***  Since last visit, she received IV Feraheme  on 07/08/2024 and 08/06/2024, which she felt that she tolerated better than the Venofer  in the past.  ***  She denies having felt any improvement in energy after her most recent iron , although it's hard to tell if she's better or not since she is so inflamed from the RA.   *** She also reports severe cold intolerance, dyspnea on exertion, and palpitations. *** Occasional dizziness and headaches.***  No chest pain.  *** She reports generalized weakness and deconditioning, with increased heart rate and tachypnea reported with exertion.*** She continues to deny any obvious rectal bleeding, melena, epistaxis, or other source of bleeding.***  She requires intermittent steroid injections via orthopedist due to her arthritis.  *** No B symptoms, masses, or lymphadenopathy.***  ASSESSMENT & PLAN:  1.  Normocytic  anemia - (anemia of chronic disease +/- iron  deficiency) - History of significant iron  deficiency with mild anemia (Hgb ranging from 11.0-12.0) since at least 2021. - Moderate to severe anemia (Hgb <10.0) since October 2024 (NOTE: Started on Plaquenil  in June 2024 for rheumatoid arthritis) - PRBC transfusion x 1 on 10/30/2023 due to Hgb 7.8, symptomatic - She was Hemoccult stool POSITIVE x 3. - Most recent EGD (06/18/2023): Normal esophagus.  Diffuse mildly congestive gastric mucosa.  Normal mucosa in duodenum.  Biopsies taken for evaluation of celiac disease were NEGATIVE. - Most recent colonoscopy (06/18/2023): Decreased sphincter tone.  Otherwise normal colon. - Capsule endoscopy (02/08/2021): Complete capsule endoscopy with no findings to explain patient's symptoms - CT enterography abdomen/pelvis (01/04/2021): No abnormal findings to explain chronic iron  deficiency   - Anemia workup summarized below:  Negative for hemolysis (normal haptoglobin, bilirubin, LDH, DAT) Nutritional panel: Iron  deficiency/functional iron  deficiency, but otherwise normal B12, MMA, folate, copper . Inflammatory markers elevated. Kidney function is normal to date SPEP, immunofixation, light chains negative for monoclonal gammopathy, but demonstrate polyclonal gammopathy that is consistent with her chronic autoimmune disease.  - Bone marrow biopsy (12/10/2023): Normocellular bone marrow with trilineage hematopoiesis.  Occasional megakaryocytes with abnormal lobation are noted, but do not reach threshold of 10% for dysplasia.  No increased blasts.  Secondary causes of patient's anemia should be considered.  Cytogenetics with normal female karyotype. - REPORTED SYMPTOMS AFTER IV IRON  She reports that her IBS seems to flareup following her Venofer  infusions, with worsening diarrhea and constipation that alternate for 5 to 7 days following each iron  infusion ***  - Most recent IV iron  with  Feraheme  x 2 on 07/08/2024 and 08/06/2024,  which she tolerated better than Venofer *** - No bright red blood per rectum or melena.*** - She has severe chronic fatigue, which is multifactorial*** - Most recent labs (11/06/2024): Hgb 12.7/MCV 96.5 Ferritin 60, iron  saturation 20%, normal TIBC 337 (Prior labs have shown normal kidney function) - DIFFERENTIAL DIAGNOSIS favors anemia of chronic disease with functional iron  deficiency, occult GI blood loss, and likely malabsorption secondary to chronic inflammation.  She may also have some drug-induced myelosuppression related to Plaquenil  (note that Plaquenil  effects on bone marrow are not always directly observed on bone marrow biopsy). - PLAN: We will schedule her for IV Feraheme  510 mg x 2 doses, to be given at the [facility]fusion [facility] 1 month apart.  (Pending prior authorization from insurance) ***tolerates better if spread out further?  *** - Continue CBC/BB sample monthly *** discontinue monthly CBC?  *** - RTC 4 months for labs and PHONE *** visit  - ***Patient continues to demand blood transfusions to help her feel better, and was again reminded that she does not meet criteria for blood transfusions and that receiving blood transfusions would not solve her underlying medical issues that are causing her to feel poorly.***  2.  Leukocytosis & thrombocytosis - Past CBCs reveal intermittent leukocytosis (neutrophil predominant) and thrombocytosis since at least 2020 - MPN work-up was negative for JAK2 V617F, JAK2 exon 12, MPL and CALR.  Reflex testing to NGS did not reveal any abnormal mutations.  (2021) - BCR/ABL FISH was negative (2021) - Inflammatory markers have been elevated in the setting of rheumatoid arthritis  - Bone marrow biopsy (12/10/2023): Normocellular bone marrow with trilineage hematopoiesis.  Occasional megakaryocytes with abnormal lobation are noted, but do not reach threshold of 10% for dysplasia.  No increased blasts.  Secondary causes of patient's anemia should be considered.   Cytogenetics with normal female karyotype. - She receives frequent steroid injections for degenerative joint disease.  She is also taking prednisone  5 mg daily since December 2024. - Findings are consistent with chronic inflammation, chronic steroid use, and reactive leukocytosis/thrombocytosis - Most recent CBC (11/07/2024) with normal platelets and normal WBC/differential. - PLAN: Most likely represents reactive leukocytosis/thrombocytosis.  We will continue active surveillance.  3.  Adult failure to thrive*** - Patient has had intermittent episodes of weight loss, malnutrition, and decreased functional status ongoing for the past several years and likely related to her severe RA and GI issues - She follows with her PCP, GI, rheumatology, and endocrinology for severe IBS and weight loss. - She is up-to-date on routine cancer screening such as colonoscopy and mammogram. - She has had extensive previous imaging including PET scan in October 2021, all of which has been negative for malignancy - Due to strong family history of malignancy, she was previously recommended for [facility] testing, but this was not covered by insurance and was cost prohibitive. - PLAN: Weight loss and malnutrition is being managed by her PCP.***   4.  PET positive right axillary lymph node - PET scan obtained 07/09/2020 due to unintentional weight loss showed mildly hypermetabolic right axillary lymph node - Right axillary lymph node was palpable on exam (November 2021) - She has completed 5 days of azithromycin  as she had a history of cat bite. - She was advised by Dr. Federico that her lymph node was likely reactive and related to her underlying inflammatory disease, but patient insisted on biopsy. - Patient opted for right axillary lymph node biopsy (10/12/2020), which showed benign  reactive lymph node, no malignancy   5.  PET positive ileocecal lesion - PET scan obtained 07/09/2020 for unintentional weight loss showed  accentuated activity along the distalmost terminal ileum and ileocecal valve, possibly physiologic since there was no mass or specific abnormality in this region on diagnostic CT as of 06/23/2020. - Patient had colonoscopy on 08/12/2020, in which the cecum and IC valvle appeared normal. Terminal ileum could not be intubated due to restricted sigmoid and redundant colon. - CT abdomen/pelvis with contrast (01/04/2021): No abnormal bowel wall thickening or abnormal mural enhancement in the stomach, small bowel, or colon.  No findings to suggest inflammatory or chronic fibrous small bowel stricture.  No mesenteric or intraperitoneal free fluid.  Terminal ileum was normal, no edema or inflammation in the region of the cecum.   6.  Osteoporosis - DEXA scan on 04/13/2020 shows T score -4.2. - She follows with endocrinology and orthopedics   7.  Rheumatoid arthritis - She follows with Dr. Jeannetta. - Previously followed with Dr. Mai / Rosaline Salt of [facility]  8.  Other issues - Patient expressed some emotional distress and difficulty coping with chronic illness and chronic pain. - She is established with palliative care services. - We discussed that she may benefit from adding a licensed counselor/therapist to her care team, but she feels overwhelmed with the idea of having another place to go to.  She was very excited to learn that online therapy may be an option. - PLAN: Patient provided with resources for coping with chronic illness, including information regarding various online counseling/therapy services.   PLAN SUMMARY:*** >> IV Feraheme  510 mg once a month x 2 (at outpatient [facility]fusion [facility])  >> CBC/BB sample every month (okay to do at Infusion Center or [facility]) *** discontinue?  *** >> Labs in 4 months = CBC/D, ferritin, iron /TIBC, BB sample*** >> PHONE *** visit in 4 months (1 week after labs)     REVIEW OF SYSTEMS: ***  Review of Systems - Oncology   PHYSICAL EXAM:   ECOG PERFORMANCE STATUS: {CHL ONC ECOG ED:[id number]} *** There were no vitals filed for this visit. There were no vitals filed for this visit. Physical Exam  PAST MEDICAL/SURGICAL HISTORY:  Past Medical History:  Diagnosis Date   Anemia    Anxiety    Arthritis    RA   Bursitis    Cataract    CHF (congestive heart failure) (HCC)    Collagen vascular disease    Depression    Endometriosis    Heart murmur    High cholesterol    Hypertension    IBS (irritable bowel syndrome)    Lymphadenopathy    Mitral valve prolapse    Osteoporosis    RA (rheumatoid arthritis) (HCC)    Rotator cuff tear    bilateral   Stroke (HCC) 06/2022   Uterus, adenomyosis    Past Surgical History:  Procedure Laterality Date   ARTERY BIOPSY Left 05/08/2024   Procedure: LEFT TEMPORAL ARTERY BIOPSY;  Surgeon: Pearline Norman RAMAN, MD;  Location: [facility] OR;  Service: Vascular;  Laterality: Left;   AXILLARY LYMPH NODE BIOPSY Right 10/12/2020   Procedure: RIGHT AXILLARY LYMPH NODE BIOPSY EXCISION;  Surgeon: Aron Shoulders, MD;  Location:  SURGERY CENTER;  Service: General;  Laterality: Right;   BUNIONECTOMY Right    CARPAL TUNNEL RELEASE Right    COLONOSCOPY     COLONOSCOPY N/A 12/13/2015   Procedure: COLONOSCOPY;  Surgeon: Claudis RAYMOND Rivet, MD;  [facility];  Service: Endoscopy;  Laterality: N/A;  9:55   Fatty tumor     2011 (left arm)   TONSILLECTOMY     TOTAL ABDOMINAL HYSTERECTOMY     2003    SOCIAL HISTORY:  Social History   Socioeconomic History   Marital status: Single    Spouse name: Not on file   Number of children: Not on file   Years of education: Not on file   Highest education level: Not on file  Occupational History   Occupation: retired  Tobacco Use   Smoking status: Some Days    Current packs/day: 0.00    Average packs/day: 0.5 packs/day for 33.0 years (16.5 ttl pk-yrs)    Types: Cigarettes    Start date: 44    Last attempt to quit: 2013    Years since  quitting: 13.1    Passive exposure: Past   Smokeless tobacco: Never  Vaping Use   Vaping status: Never Used  Substance and Sexual Activity   Alcohol use: No    Alcohol/week: 0.0 standard drinks of alcohol   Drug use: No   Sexual activity: Not Currently  Other Topics Concern   Not on file  Social History Narrative   Not on file   Social Drivers of Health   Tobacco Use: High Risk (10/22/2024)   Patient History    Smoking Tobacco Use: Some Days    Smokeless Tobacco Use: Never    Passive Exposure: Past  Financial Resource Strain: Low Risk (05/28/2024)   Received from [facility]   Overall Financial Resource Strain (CARDIA)    How hard is it for you to pay for the very basics like food, housing, medical care, and heating?: Not hard at all  Food Insecurity: No Food Insecurity (05/28/2024)   Received from [facility]   Epic    Within the past 12 months, you worried that your food would run out before you got the money to buy more.: Never true    Within the past 12 months, the food you bought just didn't last and you didn't have money to get more.: Never true  Transportation Needs: No Transportation Needs (05/28/2024)   Received from [facility]    In the past 12 months, has lack of transportation kept you from medical appointments or from getting medications?: No    In the past 12 months, has lack of transportation kept you from meetings, work, or from getting things needed for daily living?: No  Physical Activity: Inactive (05/28/2024)   Received from [facility]   Exercise Vital Sign    On average, how many days per week do you engage in moderate to strenuous exercise (like a brisk walk)?: 0 days    Minutes of Exercise per Session: Not on file  Stress: No Stress Concern Present (05/28/2024)   Received from [facility] - Occupational Stress Questionnaire    Do you feel stress - tense, restless, nervous, or anxious, or unable  to sleep at night because your mind is troubled all the time - these days?: Only a little  Social Connections: Moderately Integrated (05/28/2024)   Received from [facility]   Social Network    How would you rate your social network (family, work, friends)?: Adequate participation with social networks  Intimate Partner Violence: Not At Risk (05/28/2024)   Received from [facility]   HITS    Over the last 12 months how  often did your partner physically hurt you?: Never    Over the last 12 months how often did your partner insult you or talk down to you?: Never    Over the last 12 months how often did your partner threaten you with physical harm?: Never    Over the last 12 months how often did your partner scream or curse at you?: Never  Depression (PHQ2-9): Low Risk (07/08/2024)   Depression (PHQ2-9)    PHQ-2 Score: 0  Alcohol Screen: Not on file  Housing: Unknown (05/28/2024)   Received from [facility]    In the last 12 months, was there a time when you were not able to pay the mortgage or rent on time?: No    Number of Times Moved in the Last Year: Not on file    At any time in the past 12 months, were you homeless or living in a shelter (including now)?: No  Utilities: Not At Risk (05/28/2024)   Received from [facility]    In the past 12 months has the electric, gas, oil, or water  company threatened to shut off services in your home?: No  Health Literacy: Not on file    FAMILY HISTORY:  Family History  Problem Relation Age of Onset   Bone cancer Mother    Brain cancer Father    Brain cancer Sister    Breast cancer Sister    Renal Disease Sister    Pancreatic cancer Neg Hx    Esophageal cancer Neg Hx    Stomach cancer Neg Hx    Rectal cancer Neg Hx    Colon polyps Neg Hx     CURRENT MEDICATIONS:  Outpatient Encounter Medications as of 11/10/2024  Medication Sig   acetaminophen  (TYLENOL ) 500 MG tablet Take 1,000 mg by mouth every 6 (six) hours as needed  for moderate pain (pain score 4-6) or headache.   ALPRAZolam  (XANAX ) 0.25 MG tablet Take 0.25 mg by mouth 2 (two) times daily as needed.   Ascorbic Acid (VITAMIN C PO) Take by mouth.   ASPIRIN  81 PO Take 1 tablet by mouth daily.   atorvastatin  (LIPITOR) 10 MG tablet Take by mouth.   Cholecalciferol (VITAMIN D3) 25 MCG (1000 UT) CAPS 1 capsule Orally Once a day for 30 day(s)   dexamethasone  (DECADRON ) 2 MG tablet Take 2-4 mg by mouth every morning.   diclofenac  Sodium (VOLTAREN ) 1 % GEL SMARTSIG:Gram(s) Topical 4 Times Daily PRN   docusate sodium  (COLACE) 100 MG capsule TAKE 1 CAPSULE(100 MG) BY MOUTH TWICE DAILY   fentaNYL  (DURAGESIC ) 50 MCG/HR Place 1 patch onto the skin every other day. Patient states she does it every other day.   furosemide  (LASIX ) 20 MG tablet Take 20 mg by mouth daily as needed.   Heat Wraps (THERMACARE BACK/HIP) MISC 1 application by Does not apply route daily.   lidocaine  (LIDODERM ) 5 % one patch daily.   LORazepam  (ATIVAN ) 0.5 MG tablet Take 0.5 mg by mouth 2 (two) times daily as needed.   methylPREDNISolone  (MEDROL ) 4 MG tablet Take 1 tablet (4 mg total) by mouth daily.   minocycline  (MINOCIN ) 50 MG capsule Take 1 capsule (50 mg total) by mouth daily.   MYRBETRIQ 50 MG TB24 tablet Take 50 mg by mouth daily.   naloxone (NARCAN) nasal spray 4 mg/0.1 mL    ondansetron  (ZOFRAN -ODT) 4 MG disintegrating tablet Take 4 mg by mouth every 8 (eight) hours as needed.   pantoprazole  (  PROTONIX ) 40 MG tablet Take 1 tablet (40 mg total) by mouth 2 (two) times daily.   PARoxetine  (PAXIL ) 20 MG tablet Take 20 mg by mouth daily.   sucralfate  (CARAFATE ) 1 GM/10ML suspension SHAKE LIQUID AND TAKE 10 ML(1 GRAM) BY MOUTH FOUR TIMES DAILY (Patient taking differently: as needed.)   tiZANidine  (ZANAFLEX ) 4 MG tablet TAKE ONE TABLET BY MOUTH EVERY 8 HOURS AS NEEDED FOR MUSLE SPASM   Tocilizumab  (ACTEMRA  ACTPEN) 162 MG/0.9ML SOAJ Inject 162 mg into the skin every 7 (seven) days.   traZODone   (DESYREL ) 100 MG tablet Take 1 tablet (100 mg total) by mouth at bedtime as needed for sleep.   triamcinolone  cream (KENALOG ) 0.1 % Apply 1 Application topically 2 (two) times daily as needed.   Zoledronic  Acid (RECLAST  IV) Inject 5 mg into the vein. Once yearly   No facility-administered encounter medications on file as of 11/10/2024.    ALLERGIES:  Allergies[1]  LABORATORY DATA:  I have reviewed the labs as listed.  CBC    Component Value Date/Time   WBC 6.8 11/06/2024 1401   RBC 4.03 11/06/2024 1401   HGB 12.7 11/06/2024 1401   HGB 11.5 10/07/2020 1321   HCT 38.9 11/06/2024 1401   HCT 34.0 10/07/2020 1321   PLT 242 11/06/2024 1401   PLT 462 (H) 10/07/2020 1321   MCV 96.5 11/06/2024 1401   MCV 91 10/07/2020 1321   MCH 31.5 11/06/2024 1401   MCHC 32.6 11/06/2024 1401   RDW 13.2 11/06/2024 1401   RDW 13.7 10/07/2020 1321   LYMPHSABS 1.9 11/06/2024 1401   LYMPHSABS 2.9 10/07/2020 1321   MONOABS 0.5 11/06/2024 1401   EOSABS 0.1 11/06/2024 1401   EOSABS 0.1 10/07/2020 1321   BASOSABS 0.1 11/06/2024 1401   BASOSABS 0.1 10/07/2020 1321      Latest Ref Rng & Units 08/07/2024    1:23 PM 07/02/2024    3:27 PM 05/28/2024   10:13 AM  CMP  Glucose 65 - 99 mg/dL 62  92  89   BUN 7 - 25 mg/dL 5  9  7    Creatinine 0.50 - 1.05 mg/dL 9.38  9.38  9.45   Sodium 135 - 146 mmol/L 139  137  138   Potassium 3.5 - 5.3 mmol/L 3.3  3.3  3.4   Chloride 98 - 110 mmol/L 103  102  100   CO2 20 - 32 mmol/L 28  23  28    Calcium  8.6 - 10.4 mg/dL 9.3  8.8  8.6   Total Protein 6.5 - 8.1 g/dL  6.0  6.4   Total Bilirubin 0.0 - 1.2 mg/dL  <9.7  0.3   Alkaline Phos 38 - 126 U/L  75  95   AST 15 - 41 U/L  28  24   ALT 0 - 44 U/L  13  16     DIAGNOSTIC IMAGING:  I have independently reviewed the relevant imaging and discussed with the patient.   WRAP UP:  All questions were answered. The patient knows to call the clinic with any problems, questions or concerns.  Medical decision making:  ***  Time spent on visit: I spent *** minutes counseling the patient face to face. The total time spent in the appointment was *** minutes and more than 50% was on counseling.  Vanessa CHRISTELLA Barefoot, PA-C  ***    [1]  Allergies Allergen Reactions   Abatacept  Other (See Comments)   Codeine  Other (See Comments)    Stomach pains/constipation-  due to IBS   Golimumab  Other (See Comments)   Hydrocodone  Other (See Comments)    She does not want to take this due to intolerance to codeine .   Methylprednisolone  Diarrhea and Other (See Comments)    Made my IBS flare up  Diarrhea, nausea   Naratriptan  Diarrhea    Severe stomach pain, with constipation    Methotrexate And Trimetrexate Other (See Comments)    Agitation; pt stated, I get no sleep; one dose caused no sleep for 7 days and 7 nights - that was when I got a 0.5 injection   Prednisone  Rash    Cannot take prednisone  by mouth ---- red rash all over  Patient states she got a rash one time   Patient states she can take regular prednisone  but not the packs.   "

## 2024-11-10 ENCOUNTER — Inpatient Hospital Stay: Admitting: Physician Assistant

## 2024-11-12 ENCOUNTER — Encounter: Attending: Gastroenterology

## 2024-11-24 ENCOUNTER — Ambulatory Visit: Admitting: Surgical

## 2025-08-06 ENCOUNTER — Ambulatory Visit
